# Patient Record
Sex: Female | Born: 1935 | Race: White | Hispanic: No | Smoking: Never smoker
Health system: Southern US, Community
[De-identification: ages and names within clinical notes are randomized; demographics above are authoritative.]

## PROBLEM LIST (undated history)

## (undated) DIAGNOSIS — T8859XA Other complications of anesthesia, initial encounter: Secondary | ICD-10-CM

## (undated) DIAGNOSIS — N898 Other specified noninflammatory disorders of vagina: Secondary | ICD-10-CM

## (undated) DIAGNOSIS — T4145XA Adverse effect of unspecified anesthetic, initial encounter: Secondary | ICD-10-CM

## (undated) DIAGNOSIS — M1990 Unspecified osteoarthritis, unspecified site: Secondary | ICD-10-CM

## (undated) DIAGNOSIS — I350 Nonrheumatic aortic (valve) stenosis: Secondary | ICD-10-CM

## (undated) DIAGNOSIS — G459 Transient cerebral ischemic attack, unspecified: Secondary | ICD-10-CM

## (undated) DIAGNOSIS — K5900 Constipation, unspecified: Secondary | ICD-10-CM

## (undated) DIAGNOSIS — I10 Essential (primary) hypertension: Secondary | ICD-10-CM

## (undated) DIAGNOSIS — E039 Hypothyroidism, unspecified: Secondary | ICD-10-CM

## (undated) DIAGNOSIS — Z9289 Personal history of other medical treatment: Secondary | ICD-10-CM

## (undated) DIAGNOSIS — R600 Localized edema: Secondary | ICD-10-CM

## (undated) DIAGNOSIS — H539 Unspecified visual disturbance: Secondary | ICD-10-CM

## (undated) DIAGNOSIS — Z8669 Personal history of other diseases of the nervous system and sense organs: Secondary | ICD-10-CM

## (undated) DIAGNOSIS — E119 Type 2 diabetes mellitus without complications: Secondary | ICD-10-CM

## (undated) DIAGNOSIS — E785 Hyperlipidemia, unspecified: Secondary | ICD-10-CM

## (undated) DIAGNOSIS — I4821 Permanent atrial fibrillation: Secondary | ICD-10-CM

## (undated) DIAGNOSIS — I639 Cerebral infarction, unspecified: Secondary | ICD-10-CM

## (undated) DIAGNOSIS — D649 Anemia, unspecified: Secondary | ICD-10-CM

## (undated) DIAGNOSIS — G35 Multiple sclerosis: Secondary | ICD-10-CM

## (undated) DIAGNOSIS — I739 Peripheral vascular disease, unspecified: Secondary | ICD-10-CM

## (undated) HISTORY — DX: Localized edema: R60.0

## (undated) HISTORY — DX: Multiple sclerosis: G35

## (undated) HISTORY — DX: Hypothyroidism, unspecified: E03.9

## (undated) HISTORY — PX: CATARACT EXTRACTION, BILATERAL: SHX1313

## (undated) HISTORY — DX: Peripheral vascular disease, unspecified: I73.9

## (undated) HISTORY — DX: Personal history of other medical treatment: Z92.89

## (undated) HISTORY — PX: BREAST SURGERY: SHX581

## (undated) HISTORY — DX: Essential (primary) hypertension: I10

## (undated) HISTORY — DX: Hyperlipidemia, unspecified: E78.5

## (undated) HISTORY — DX: Nonrheumatic aortic (valve) stenosis: I35.0

## (undated) HISTORY — PX: JOINT REPLACEMENT: SHX530

## (undated) HISTORY — PX: THYROIDECTOMY, PARTIAL: SHX18

## (undated) HISTORY — DX: Personal history of other diseases of the nervous system and sense organs: Z86.69

## (undated) HISTORY — DX: Permanent atrial fibrillation: I48.21

## (undated) HISTORY — PX: CHOLECYSTECTOMY: SHX55

---

## 2009-08-30 ENCOUNTER — Encounter (INDEPENDENT_AMBULATORY_CARE_PROVIDER_SITE_OTHER): Payer: Self-pay | Admitting: Internal Medicine

## 2009-08-30 ENCOUNTER — Inpatient Hospital Stay (HOSPITAL_COMMUNITY): Admission: EM | Admit: 2009-08-30 | Discharge: 2009-09-02 | Payer: Self-pay | Admitting: Emergency Medicine

## 2009-08-30 ENCOUNTER — Ambulatory Visit: Payer: Self-pay | Admitting: Cardiology

## 2009-08-30 ENCOUNTER — Ambulatory Visit: Payer: Self-pay | Admitting: Surgery

## 2009-08-30 DIAGNOSIS — I739 Peripheral vascular disease, unspecified: Secondary | ICD-10-CM

## 2009-08-30 HISTORY — DX: Peripheral vascular disease, unspecified: I73.9

## 2009-09-04 ENCOUNTER — Ambulatory Visit (HOSPITAL_COMMUNITY): Admission: RE | Admit: 2009-09-04 | Discharge: 2009-09-04 | Payer: Self-pay | Admitting: Internal Medicine

## 2009-11-16 ENCOUNTER — Emergency Department (HOSPITAL_COMMUNITY)
Admission: EM | Admit: 2009-11-16 | Discharge: 2009-11-17 | Payer: Self-pay | Source: Home / Self Care | Admitting: Emergency Medicine

## 2010-01-17 ENCOUNTER — Encounter: Admission: RE | Admit: 2010-01-17 | Discharge: 2010-01-17 | Payer: Self-pay | Admitting: Neurology

## 2010-03-07 ENCOUNTER — Encounter: Admission: RE | Admit: 2010-03-07 | Discharge: 2010-03-07 | Payer: Self-pay | Admitting: *Deleted

## 2010-05-17 DIAGNOSIS — Z9289 Personal history of other medical treatment: Secondary | ICD-10-CM

## 2010-05-17 HISTORY — DX: Personal history of other medical treatment: Z92.89

## 2010-06-10 ENCOUNTER — Inpatient Hospital Stay (HOSPITAL_COMMUNITY)
Admission: RE | Admit: 2010-06-10 | Discharge: 2010-06-16 | Payer: Self-pay | Source: Home / Self Care | Admitting: Internal Medicine

## 2010-08-17 HISTORY — PX: TOTAL KNEE ARTHROPLASTY: SHX125

## 2010-10-29 LAB — CBC
HCT: 32.1 % — ABNORMAL LOW (ref 36.0–46.0)
HCT: 33 % — ABNORMAL LOW (ref 36.0–46.0)
HCT: 34.8 % — ABNORMAL LOW (ref 36.0–46.0)
Hemoglobin: 10.5 g/dL — ABNORMAL LOW (ref 12.0–15.0)
Hemoglobin: 11 g/dL — ABNORMAL LOW (ref 12.0–15.0)
Hemoglobin: 11.3 g/dL — ABNORMAL LOW (ref 12.0–15.0)
Hemoglobin: 14.7 g/dL (ref 12.0–15.0)
MCH: 29.6 pg (ref 26.0–34.0)
MCH: 30.6 pg (ref 26.0–34.0)
MCH: 30.6 pg (ref 26.0–34.0)
MCH: 30.9 pg (ref 26.0–34.0)
MCHC: 32.5 g/dL (ref 30.0–36.0)
MCHC: 32.7 g/dL (ref 30.0–36.0)
MCHC: 33.3 g/dL (ref 30.0–36.0)
MCV: 91.1 fL (ref 78.0–100.0)
MCV: 92.7 fL (ref 78.0–100.0)
MCV: 93.6 fL (ref 78.0–100.0)
Platelets: 165 K/uL (ref 150–400)
Platelets: 170 K/uL (ref 150–400)
Platelets: 265 10*3/uL (ref 150–400)
RBC: 3.43 MIL/uL — ABNORMAL LOW (ref 3.87–5.11)
RBC: 3.56 MIL/uL — ABNORMAL LOW (ref 3.87–5.11)
RBC: 4.8 MIL/uL (ref 3.87–5.11)
RDW: 13.8 % (ref 11.5–15.5)
RDW: 13.8 % (ref 11.5–15.5)
WBC: 12.1 K/uL — ABNORMAL HIGH (ref 4.0–10.5)
WBC: 7.9 10*3/uL (ref 4.0–10.5)
WBC: 9.7 K/uL (ref 4.0–10.5)

## 2010-10-29 LAB — BASIC METABOLIC PANEL WITH GFR
BUN: 12 mg/dL (ref 6–23)
CO2: 29 meq/L (ref 19–32)
Calcium: 8 mg/dL — ABNORMAL LOW (ref 8.4–10.5)
Chloride: 102 meq/L (ref 96–112)
Creatinine, Ser: 0.85 mg/dL (ref 0.4–1.2)
GFR calc non Af Amer: >60 mL/min
Glucose, Bld: 133 mg/dL — ABNORMAL HIGH (ref 70–99)
Potassium: 4 meq/L (ref 3.5–5.1)
Sodium: 139 meq/L (ref 135–145)

## 2010-10-29 LAB — URINALYSIS, ROUTINE W REFLEX MICROSCOPIC
Bilirubin Urine: NEGATIVE
Bilirubin Urine: NEGATIVE
Glucose, UA: NEGATIVE mg/dL
Hgb urine dipstick: NEGATIVE
Hgb urine dipstick: NEGATIVE
Ketones, ur: NEGATIVE mg/dL
Nitrite: NEGATIVE
Nitrite: NEGATIVE
Protein, ur: NEGATIVE mg/dL
Protein, ur: NEGATIVE mg/dL
Specific Gravity, Urine: 1.012 (ref 1.005–1.030)
Urobilinogen, UA: 0.2 mg/dL (ref 0.0–1.0)
Urobilinogen, UA: 0.2 mg/dL (ref 0.0–1.0)
pH: 6 (ref 5.0–8.0)

## 2010-10-29 LAB — BASIC METABOLIC PANEL
BUN: 8 mg/dL (ref 6–23)
CO2: 30 mEq/L (ref 19–32)
CO2: 33 mEq/L — ABNORMAL HIGH (ref 19–32)
Calcium: 8.4 mg/dL (ref 8.4–10.5)
Calcium: 9.1 mg/dL (ref 8.4–10.5)
Chloride: 96 mEq/L (ref 96–112)
Creatinine, Ser: 0.74 mg/dL (ref 0.4–1.2)
Creatinine, Ser: 0.76 mg/dL (ref 0.4–1.2)
GFR calc Af Amer: >60 mL/min (ref >60)
GFR calc Af Amer: >60 mL/min (ref >60)
GFR calc non Af Amer: >60 mL/min (ref >60)
Glucose, Bld: 119 mg/dL — ABNORMAL HIGH (ref 70–99)
Potassium: 3.3 mEq/L — ABNORMAL LOW (ref 3.5–5.1)
Sodium: 137 mEq/L (ref 135–145)
Sodium: 140 mEq/L (ref 135–145)

## 2010-10-29 LAB — DIFFERENTIAL
Eosinophils Absolute: 0.4 10*3/uL (ref 0.0–0.7)
Lymphs Abs: 2.3 10*3/uL (ref 0.7–4.0)
Monocytes Relative: 8 % (ref 3–12)
Neutro Abs: 4.6 10*3/uL (ref 1.7–7.7)
Neutrophils Relative %: 57 % (ref 43–77)

## 2010-10-29 LAB — PROTIME-INR
INR: 0.94 (ref 0.00–1.49)
INR: 1.49 (ref 0.00–1.49)
Prothrombin Time: 12.8 seconds (ref 11.6–15.2)
Prothrombin Time: 18.2 seconds — ABNORMAL HIGH (ref 11.6–15.2)

## 2010-10-29 LAB — URINE CULTURE
Colony Count: NO GROWTH
Culture  Setup Time: 201110290048
Culture: NO GROWTH
Special Requests: NEGATIVE

## 2010-10-29 LAB — TYPE AND SCREEN
ABO/RH(D): O POS
Antibody Screen: NEGATIVE

## 2010-10-29 LAB — SURGICAL PCR SCREEN: MRSA, PCR: NEGATIVE

## 2010-10-29 LAB — ABO/RH: ABO/RH(D): O POS

## 2010-10-29 LAB — APTT: aPTT: 55 seconds — ABNORMAL HIGH (ref 24–37)

## 2010-11-02 LAB — CBC
HCT: 40.7 % (ref 36.0–46.0)
Hemoglobin: 13.4 g/dL (ref 12.0–15.0)
Hemoglobin: 13.7 g/dL (ref 12.0–15.0)
MCV: 91.7 fL (ref 78.0–100.0)
RBC: 4.31 MIL/uL (ref 3.87–5.11)
RBC: 4.44 MIL/uL (ref 3.87–5.11)
WBC: 5.9 10*3/uL (ref 4.0–10.5)
WBC: 7.8 10*3/uL (ref 4.0–10.5)

## 2010-11-02 LAB — LIPID PANEL
Cholesterol: 217 mg/dL — ABNORMAL HIGH (ref 0–200)
HDL: 44 mg/dL (ref >39)
LDL Cholesterol: 146 mg/dL — ABNORMAL HIGH (ref 0–99)
Total CHOL/HDL Ratio: 4.9 RATIO
Triglycerides: 137 mg/dL (ref <150)

## 2010-11-02 LAB — COMPREHENSIVE METABOLIC PANEL
ALT: 15 U/L (ref 0–35)
AST: 24 U/L (ref 0–37)
CO2: 28 mEq/L (ref 19–32)
Calcium: 9 mg/dL (ref 8.4–10.5)
Chloride: 104 mEq/L (ref 96–112)
GFR calc Af Amer: >60 mL/min (ref >60)
GFR calc non Af Amer: >60 mL/min (ref >60)
Sodium: 147 mEq/L — ABNORMAL HIGH (ref 135–145)
Total Bilirubin: 0.4 mg/dL (ref 0.3–1.2)

## 2010-11-02 LAB — BASIC METABOLIC PANEL
BUN: 10 mg/dL (ref 6–23)
Chloride: 101 mEq/L (ref 96–112)
GFR calc Af Amer: >60 mL/min (ref >60)
Potassium: 3.6 mEq/L (ref 3.5–5.1)
Sodium: 137 mEq/L (ref 135–145)

## 2010-11-02 LAB — GLUCOSE, CAPILLARY: Glucose-Capillary: 81 mg/dL (ref 70–99)

## 2010-11-02 LAB — DIFFERENTIAL
Eosinophils Absolute: 0.3 10*3/uL (ref 0.0–0.7)
Eosinophils Relative: 6 % — ABNORMAL HIGH (ref 0–5)
Lymphs Abs: 1.3 10*3/uL (ref 0.7–4.0)

## 2010-11-02 LAB — PROTIME-INR
INR: 1.32 (ref 0.00–1.49)
Prothrombin Time: 16.3 seconds — ABNORMAL HIGH (ref 11.6–15.2)
Prothrombin Time: 17.2 seconds — ABNORMAL HIGH (ref 11.6–15.2)

## 2010-11-02 LAB — URINALYSIS, ROUTINE W REFLEX MICROSCOPIC
Bilirubin Urine: NEGATIVE
Nitrite: NEGATIVE
Specific Gravity, Urine: 1.011 (ref 1.005–1.030)
pH: 7.5 (ref 5.0–8.0)

## 2010-11-02 LAB — URINE CULTURE: Colony Count: >=100000

## 2010-11-05 LAB — POCT I-STAT, CHEM 8
BUN: 23 mg/dL (ref 6–23)
Chloride: 104 mEq/L (ref 96–112)
Potassium: 4.1 mEq/L (ref 3.5–5.1)
Sodium: 141 mEq/L (ref 135–145)

## 2010-11-05 LAB — PROTIME-INR: INR: 2.04 — ABNORMAL HIGH (ref 0.00–1.49)

## 2011-02-15 DIAGNOSIS — Z9289 Personal history of other medical treatment: Secondary | ICD-10-CM

## 2011-02-15 HISTORY — DX: Personal history of other medical treatment: Z92.89

## 2011-10-14 DIAGNOSIS — M545 Low back pain: Secondary | ICD-10-CM | POA: Diagnosis not present

## 2011-10-18 IMAGING — CT CT HEAD W/O CM
1 series · 16 of 30 positions shown, 20 images · non-contrast
Comparison: None.

CLINICAL DATA: Code stroke.  Confusion and slurred speech.

CT HEAD WITHOUT CONTRAST
TECHNIQUE: Contiguous axial images were obtained from the base of
the skull through the vertex without contrast.

[Series 2: head routine 4.8 h37s · axial · 0.44mm/px · z∈[-82,+54]mm · 16 of 30 slices shown, 20 images]
[im 2/30  brain]
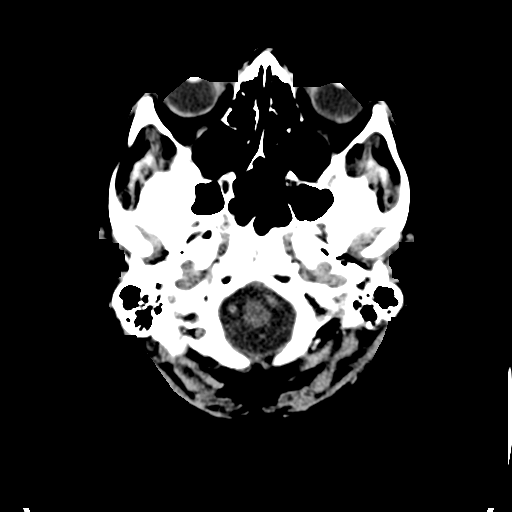
[im 2/30  bone]
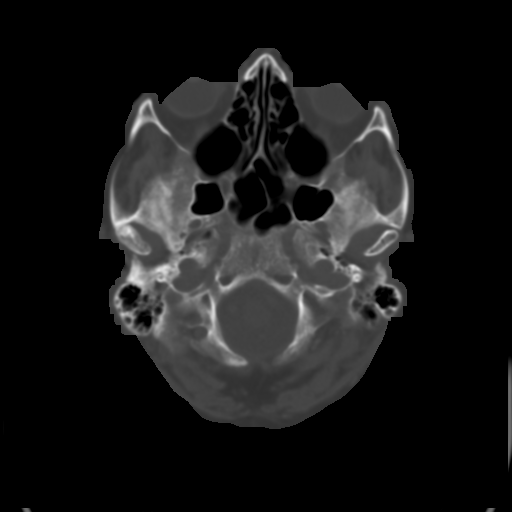
[im 4/30  brain]
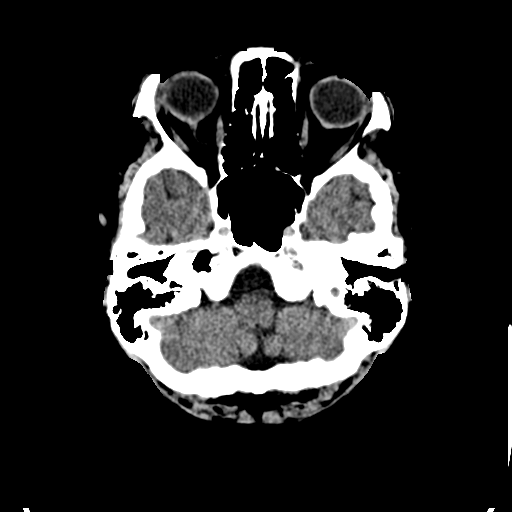
[im 6/30  brain]
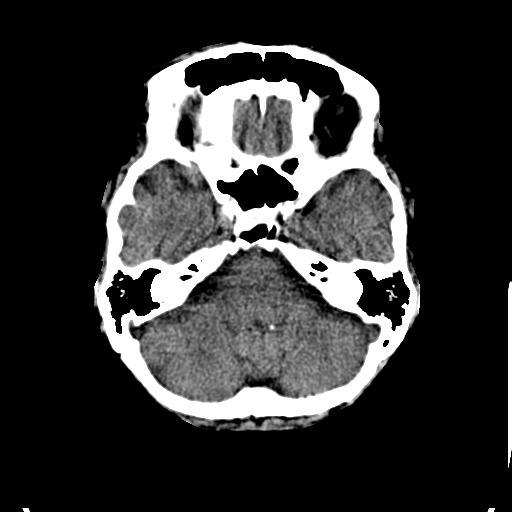
[im 8/30  brain]
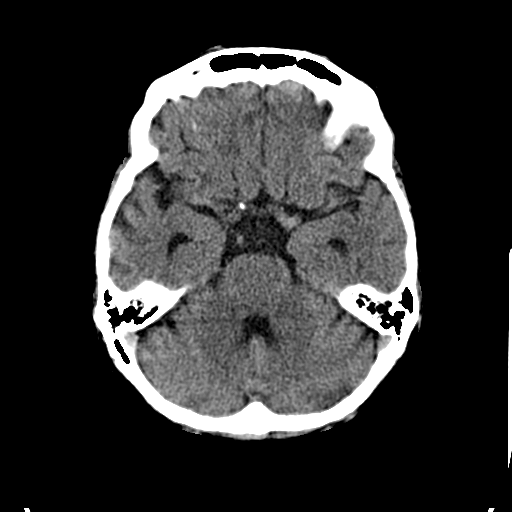
[im 9/30  brain]
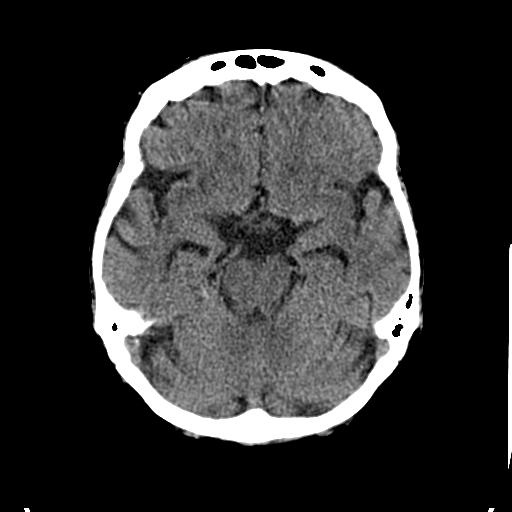
[im 9/30  bone]
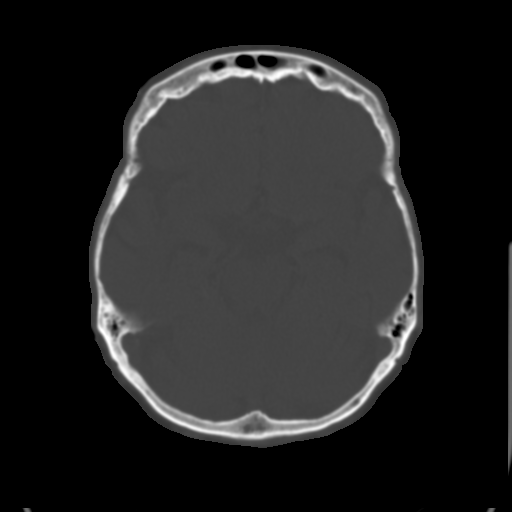
[im 11/30  brain]
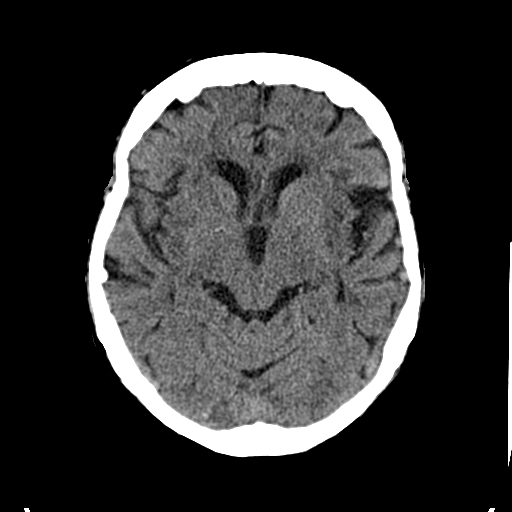
[im 13/30  brain]
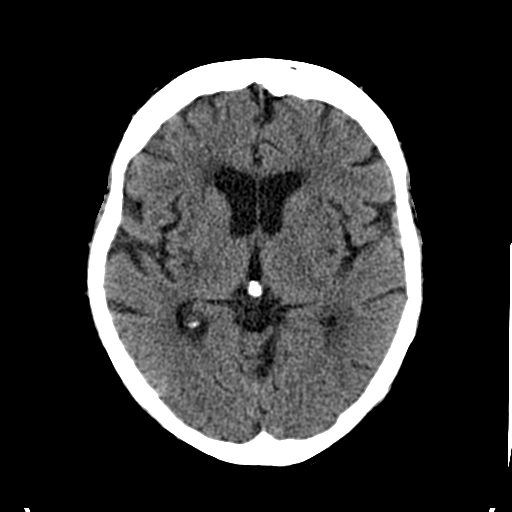
[im 15/30  brain]
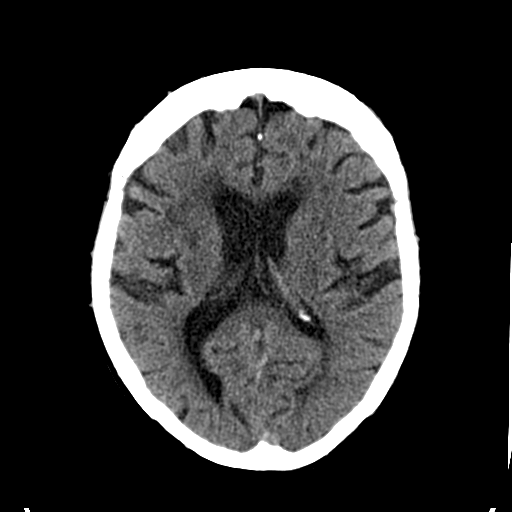
[im 16/30  brain]
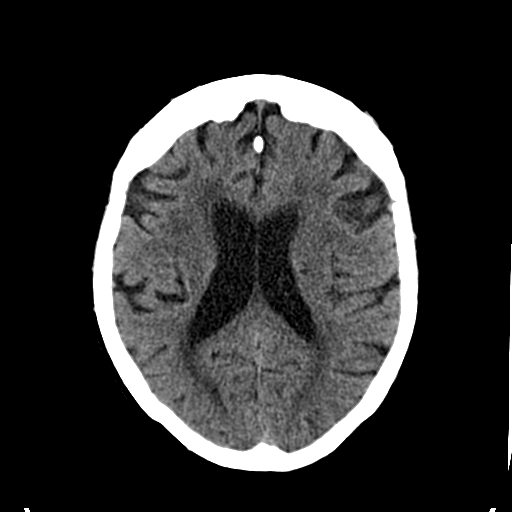
[im 16/30  bone]
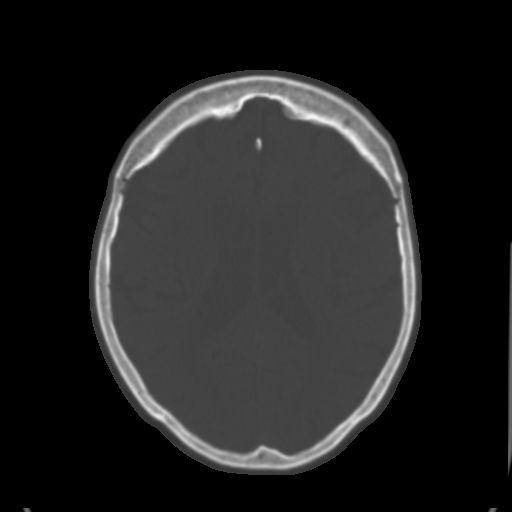
[im 18/30  brain]
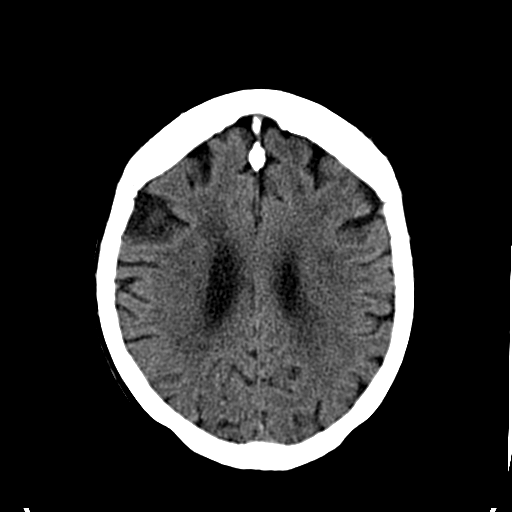
[im 20/30  brain]
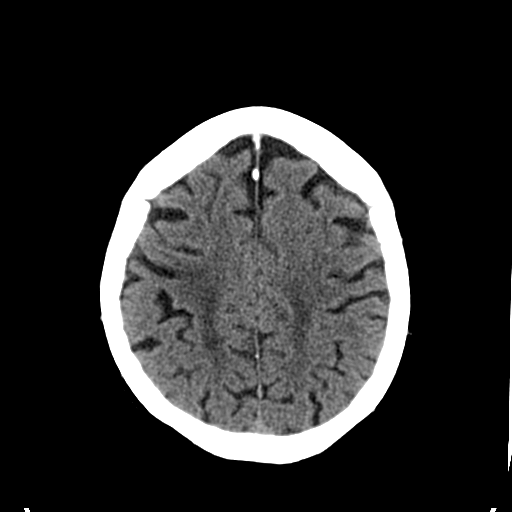
[im 22/30  brain]
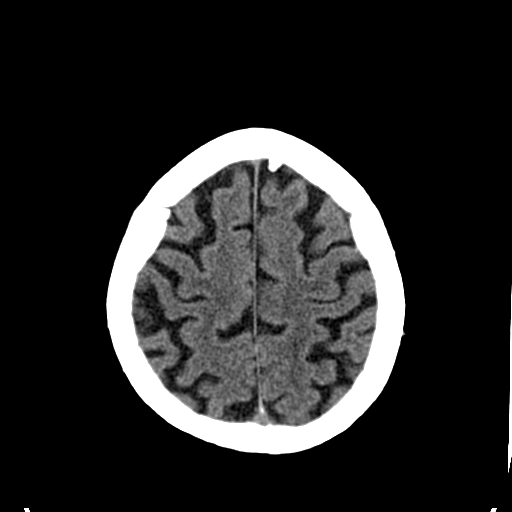
[im 23/30  brain]
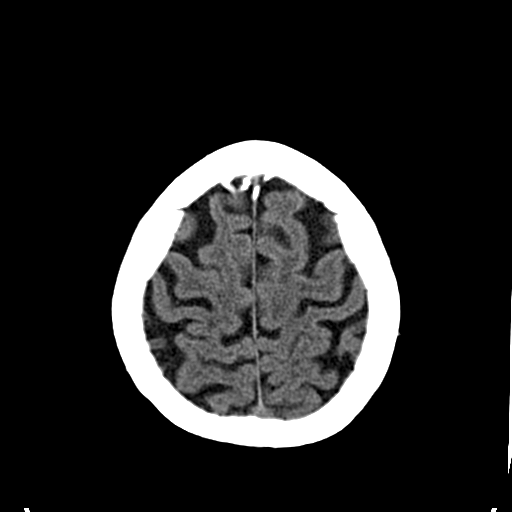
[im 23/30  bone]
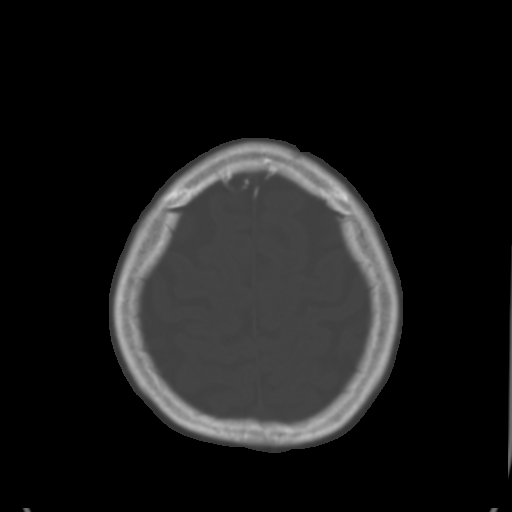
[im 25/30  brain]
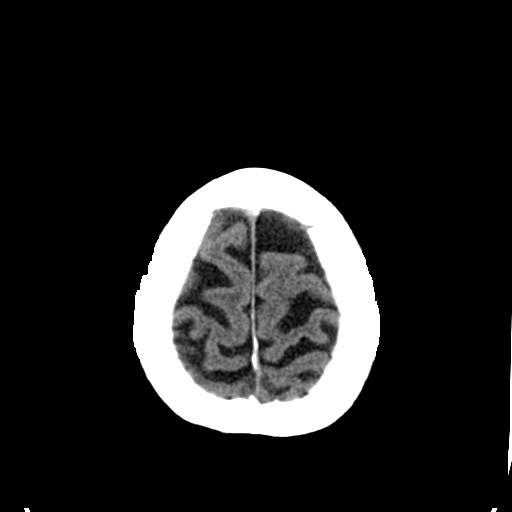
[im 27/30  brain]
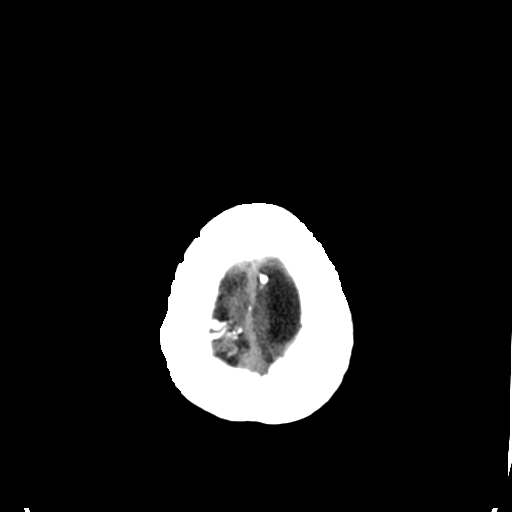
[im 29/30  brain]
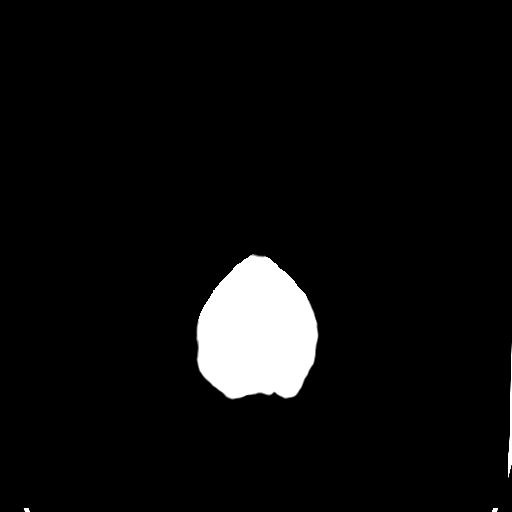

[16 of 30 positions shown; findings below may reference images not displayed]

FINDINGS: There is age appropriate atrophy.  Chronic microvascular
ischemia in the white matter and basal ganglia.  Negative for acute
infarct.  Negative for hemorrhage or mass lesion.  Calvarium is
intact.
IMPRESSION: Chronic microvascular ischemia.  No acute intracranial abnormality.

Critical test results telephoned to Dr. Berenike at the time of
interpretation on 08/30/2009. at 0809 hours.

## 2011-10-18 IMAGING — MR MR HEAD W/O CM
7 of 9 series · 25 of 48 positions shown · non-contrast
Comparison: CT 08/30/2009.

MRI HEAD

CLINICAL DATA: Code stroke.  Slurred speech.  Confusion.

MRI HEAD WITHOUT CONTRAST
MRA HEAD WITHOUT CONTRAST
TECHNIQUE: Multiplanar, multiecho pulse sequences of the brain and
surrounding structures were obtained according to standard protocol
without intravenous contrast.  Angiographic images of the head were
obtained using MRA technique without contrast.

[Series 3: DWI · axial · 5.0mm · 1.09mm/px · z∈[-61,+86]mm · 6 of 56 slices shown (1 of 2)]
[im 1/56]
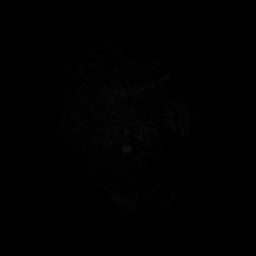
[im 12/56]
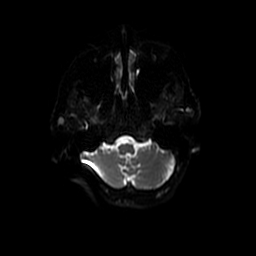
[im 23/56]
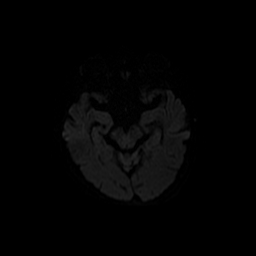
[im 34/56]
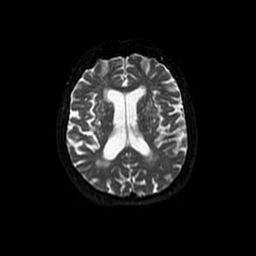
[im 45/56]
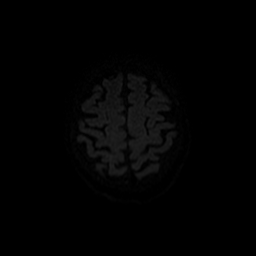
[im 56/56]
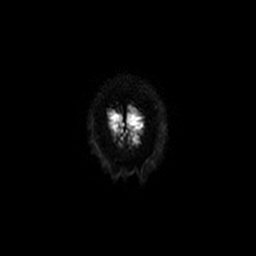

[Series 4: T1 · sagittal · 5.0mm · 0.47mm/px · 2 of 15 slices shown]
[im 1/15]
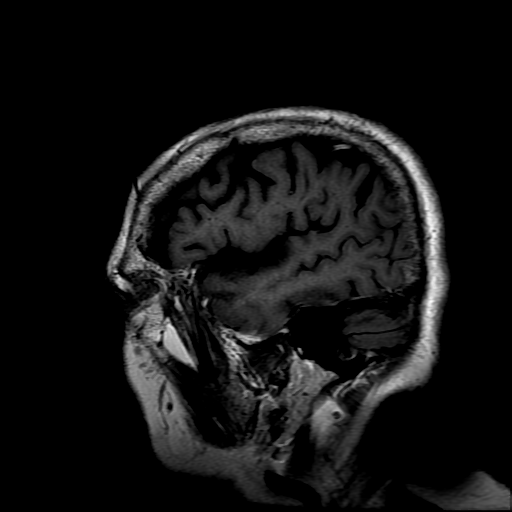
[im 15/15]
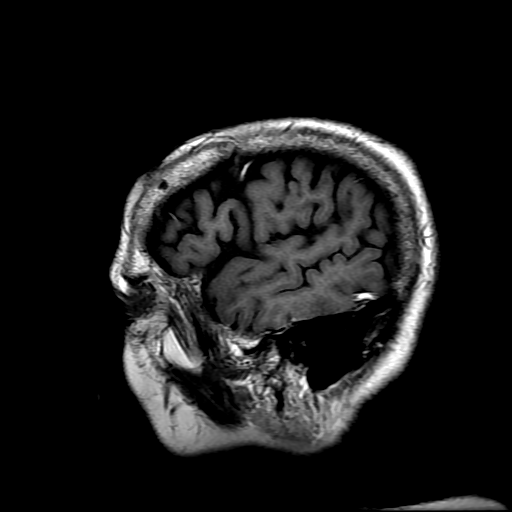

[Series 5: T2 · axial · 5.0mm · 0.43mm/px · z∈[-47,+99]mm · 3 of 22 slices shown (1 of 2)]
[im 1/22]
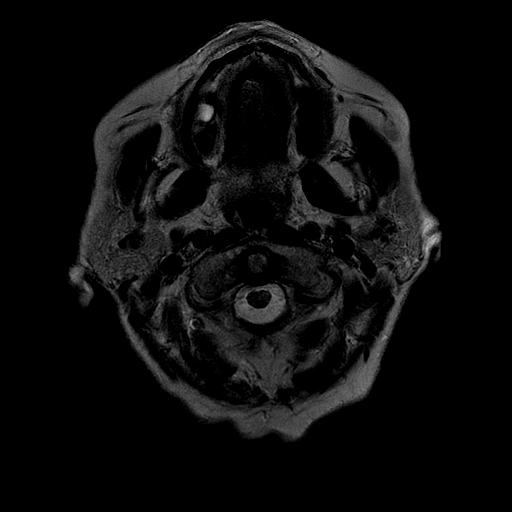
[im 11/22]
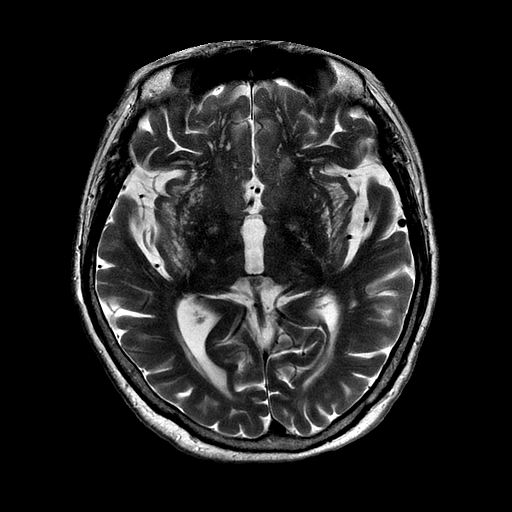
[im 22/22]
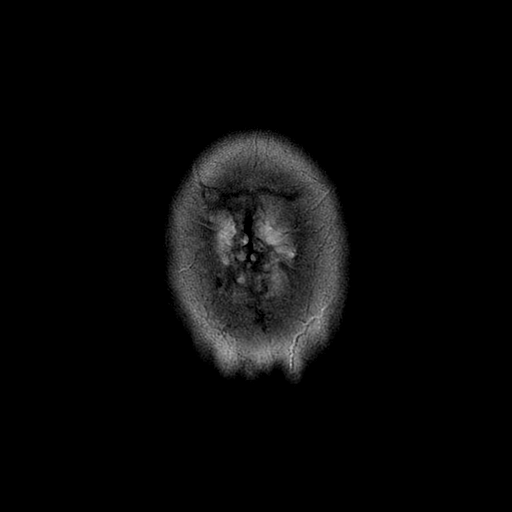

[Series 6: FLAIR · axial · 5.0mm · 0.43mm/px · z∈[-47,+99]mm · 3 of 22 slices shown]
[im 1/22]
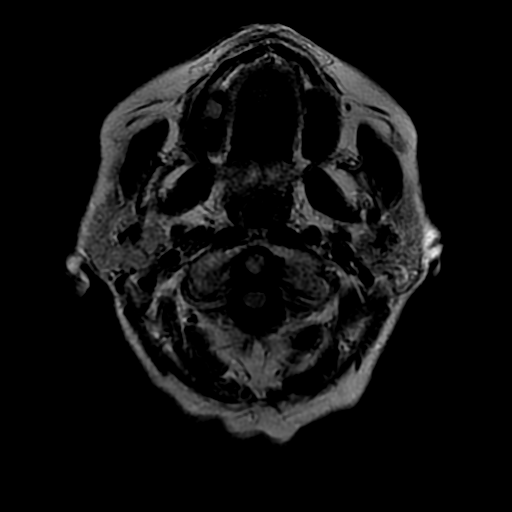
[im 11/22]
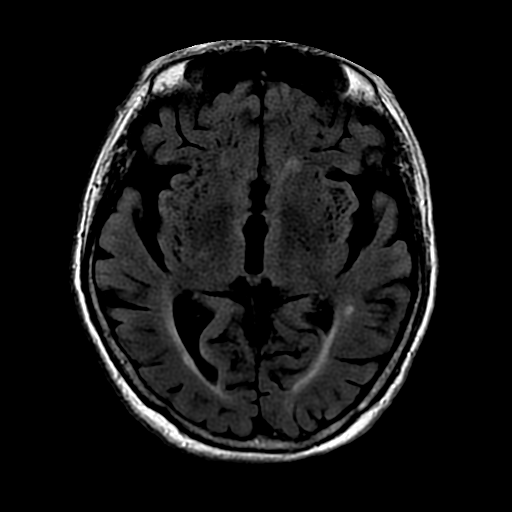
[im 22/22]
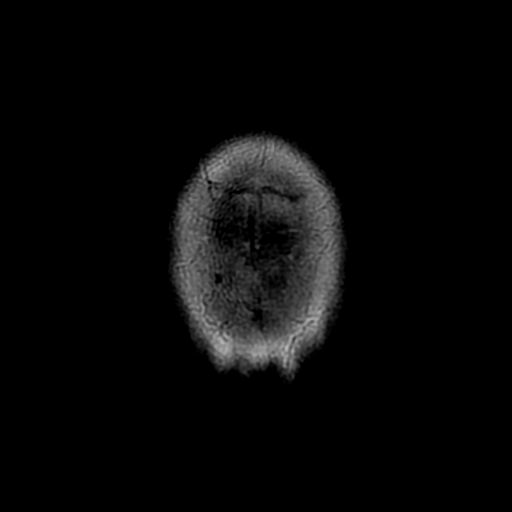

[Series 7: (id) mt fs · axial · 1.4mm · 0.43mm/px · z∈[-37,-1]mm · 4 of 137 slices shown]
[im 9/137]
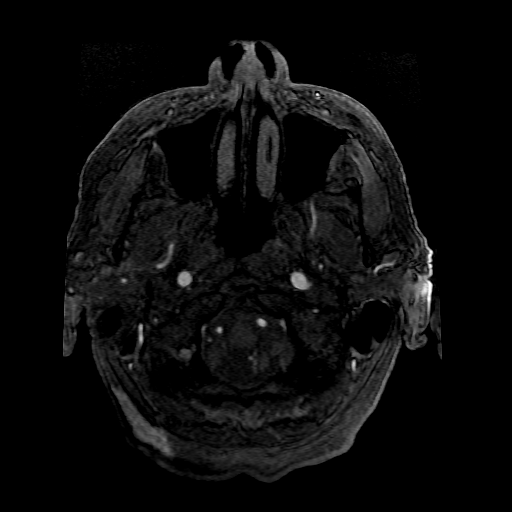
[im 26/137]
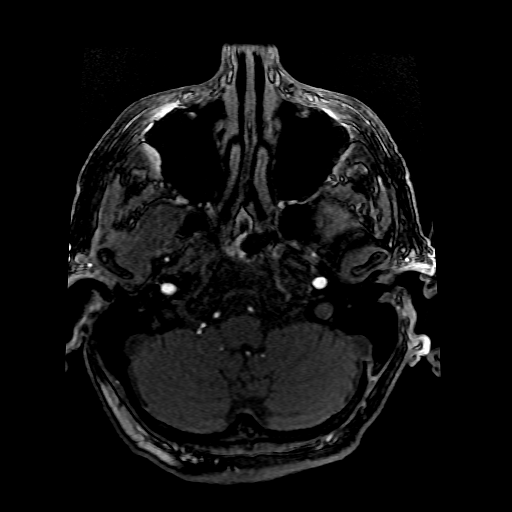
[im 43/137]
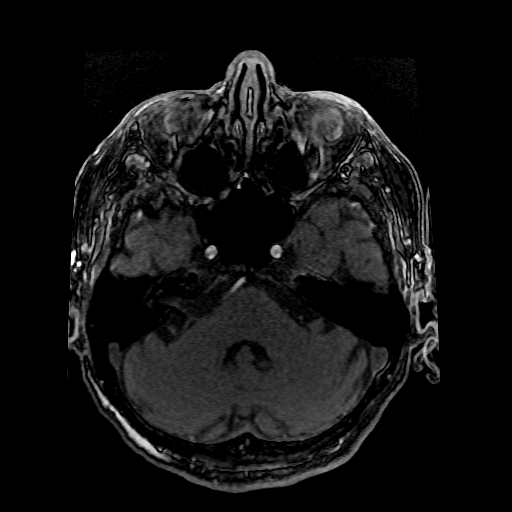
[im 60/137]
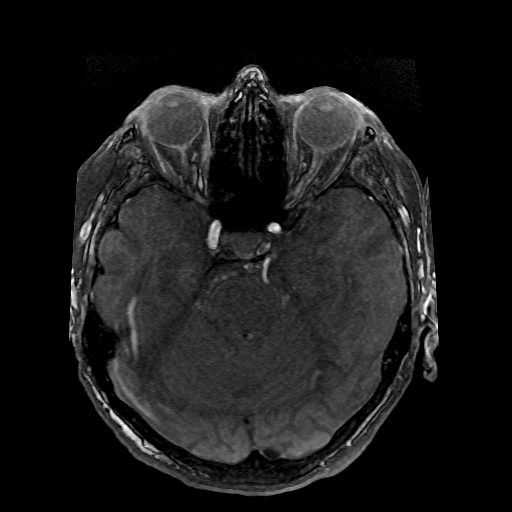

[Series 10: T2 · coronal · 5.0mm · 0.39mm/px · 3 of 24 slices shown (2 of 2)]
[im 1/24]
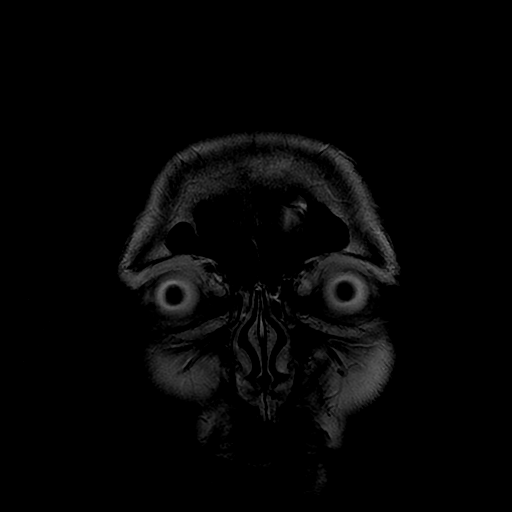
[im 12/24]
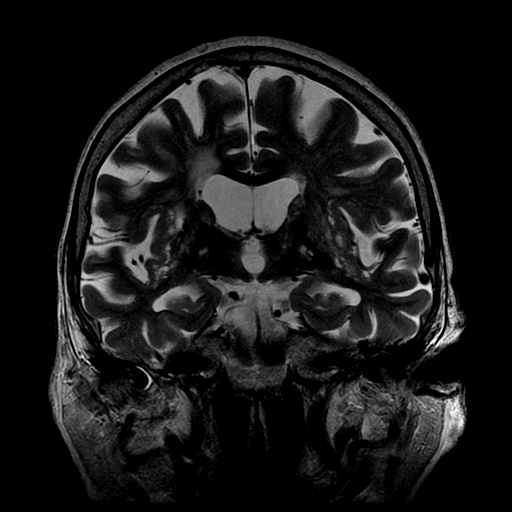
[im 24/24]
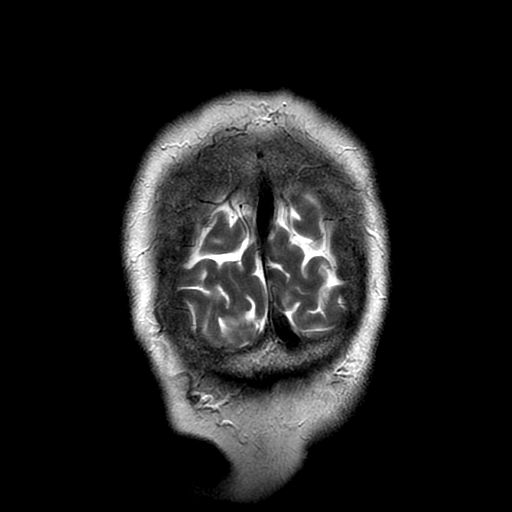

[Series 300: DWI · axial · 5.0mm · 1.09mm/px · z∈[-61,+86]mm · 4 of 28 slices shown (2 of 2)]
[im 1/28]
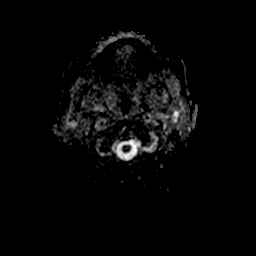
[im 10/28]
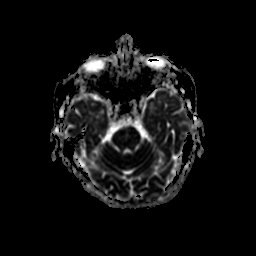
[im 19/28]
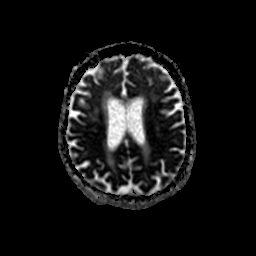
[im 28/28]
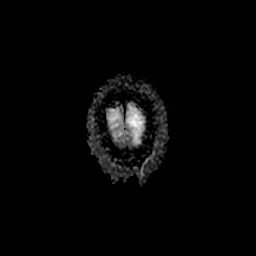

[25 of 48 positions shown; findings below may reference images not displayed]

FINDINGS: Acute infarct in the right posterior frontal cortex and
subcortical white matter.    This area measures approximately 5 x
25 mm.  There is no associated hemorrhage.  On gradient echo
imaging there is suggestion of thrombus in a cortical vessel in the
subarachnoid space near this infarction which could be due to an
embolism or thrombus.  No other acute infarcts are identified.

Small area of micro hemorrhage in the right frontal lobe.  There
also are micro hemorrhages in the right cerebellum.  These may be
due to chronic hypertension.

There is volume loss with generalized atrophy.  Chronic
microvascular ischemia in the white matter and pons.  Negative for
mass lesion.
IMPRESSION: Acute infarct in the right posterior frontal cortex and white
matter.  This could be due to a small embolus or thrombosis.

Atrophy and chronic microvascular ischemia.  There are scattered
small areas of micro hemorrhage which appear chronic and may be due
to hypertension.

MRA HEAD
FINDINGS: There is fetal origin of the posterior cerebral arteries
bilaterally.  The vertebral artery system is relatively small with
hypoplasia of the terminal basilar.  No significant stenosis in the
posterior circulation.

The internal carotid arteries are patent bilaterally.  The anterior
and middle cerebral arteries are patent bilaterally.  Negative for
cerebral aneurysm.
IMPRESSION: No significant intracranial stenosis.

## 2011-10-23 IMAGING — RF DG ESOPHAGUS
8 of 9 series · 19 of 24 positions shown · non-contrast
Comparison: None.

CLINICAL DATA: History given of dysphagia.

ESOPHOGRAM / BARIUM SWALLOW / BARIUM TABLET STUDY
TECHNIQUE: Combined double contrast and single contrast
examination performed using effervescent crystals, thick barium
liquid, and thin barium liquid.  The patient was observed with
fluoroscopy swallowing a 13mm barium sulphate tablet.
Fluoroscopy time:  1.6 minutes.

[Series 1: run · 3 of 17 slices shown (1 of 8)]
[im 1/17]
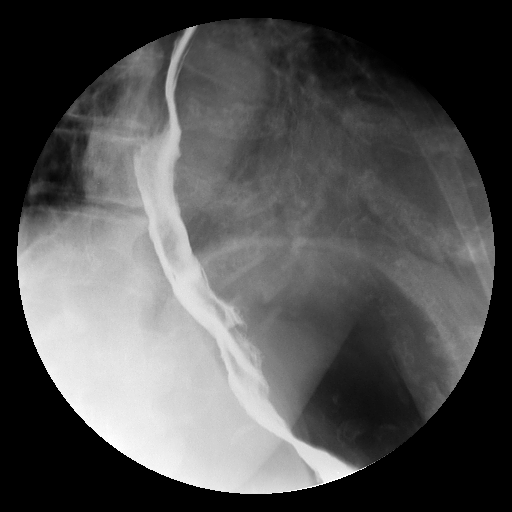
[im 6/17]
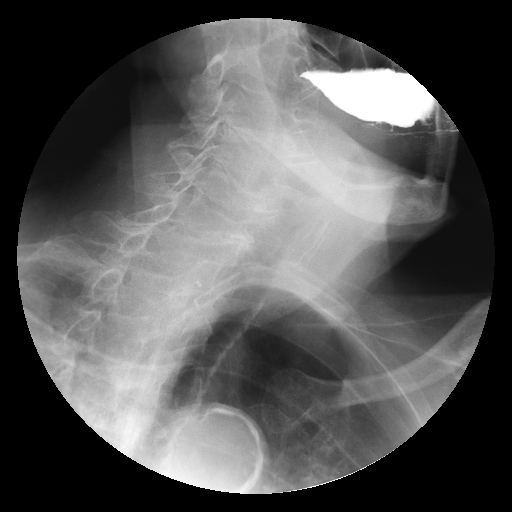
[im 17/17]
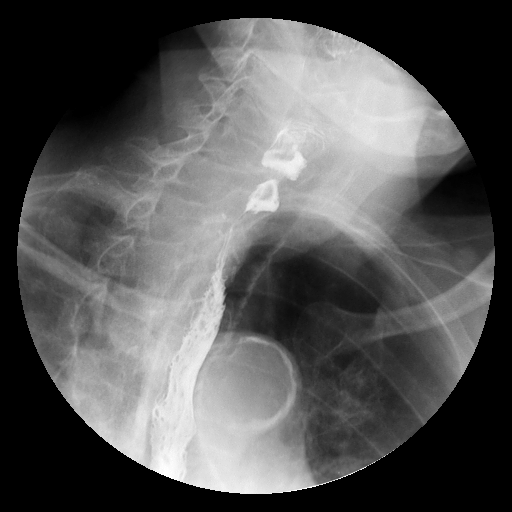

[Series 2: run · 3 of 17 slices shown (2 of 8)]
[im 1/17]
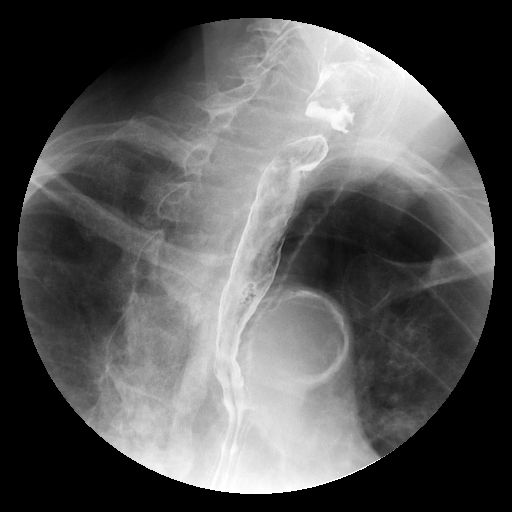
[im 6/17]
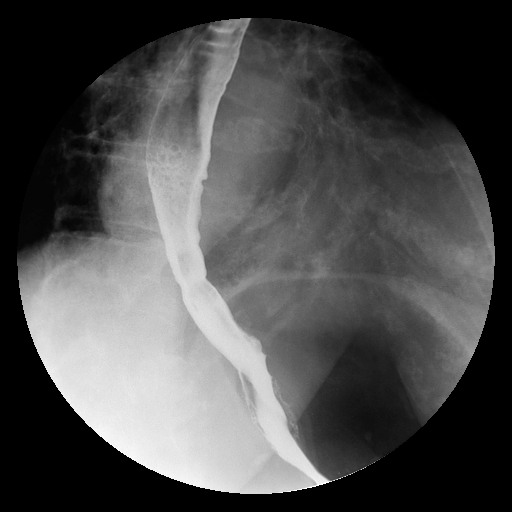
[im 11/17]
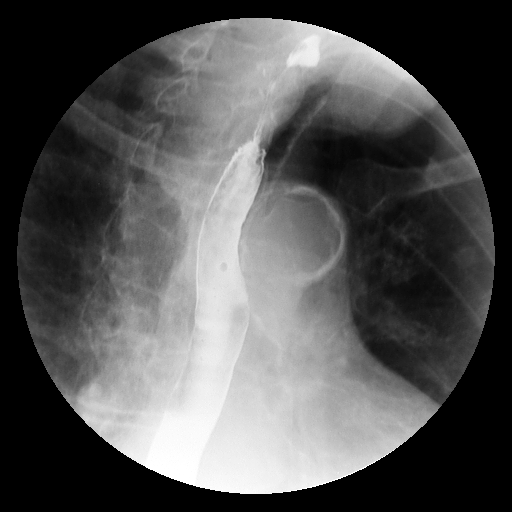

[Series 3: run · 2 of 10 slices shown (3 of 8)]
[im 1/10]
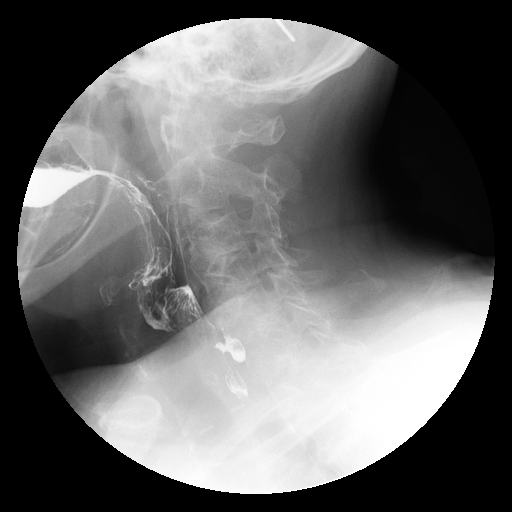
[im 10/10]
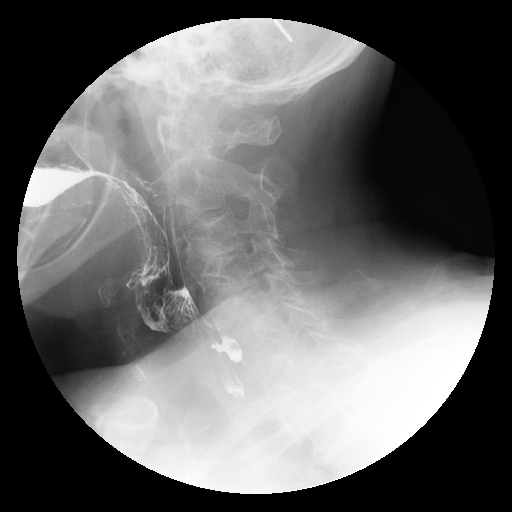

[Series 4: run · 1 of 10 slices shown (4 of 8)]
[im 1/10]
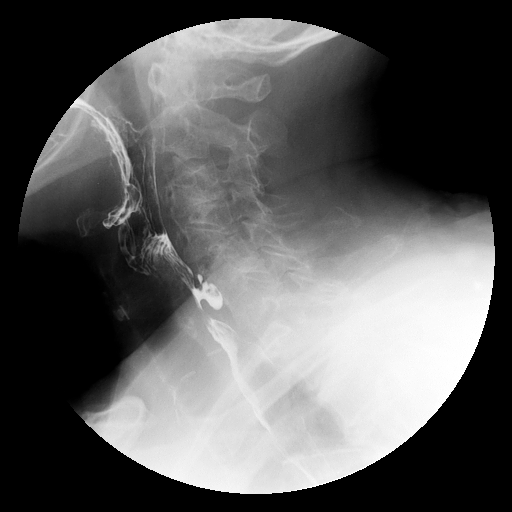

[Series 5: run · 3 of 11 slices shown (5 of 8)]
[im 1/11]
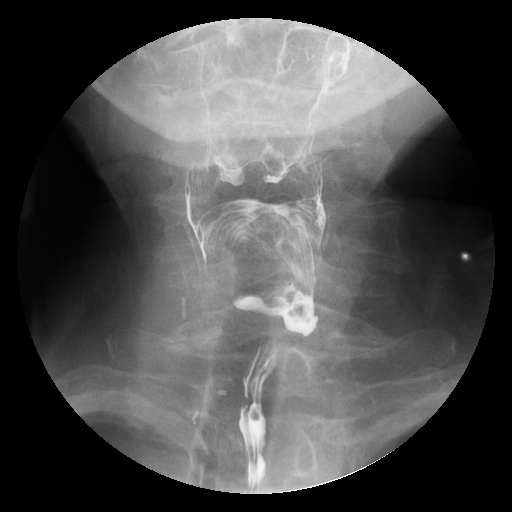
[im 6/11]
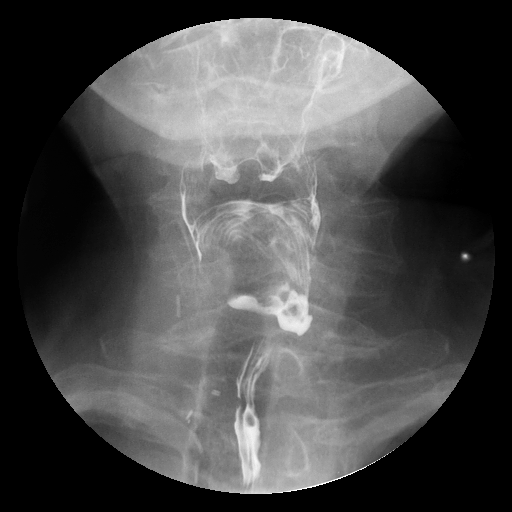
[im 11/11]
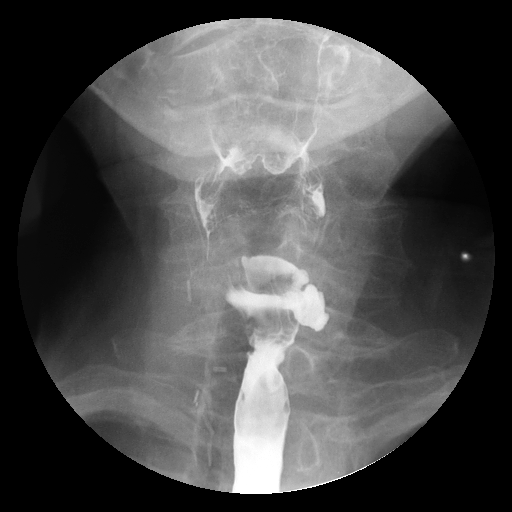

[Series 6: run · 1 of 1 slices shown (6 of 8)]
[im 1/1]
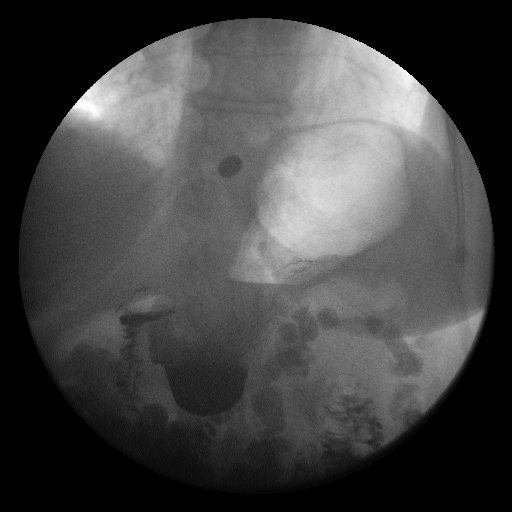

[Series 8: run · 2 of 10 slices shown (7 of 8)]
[im 1/10]
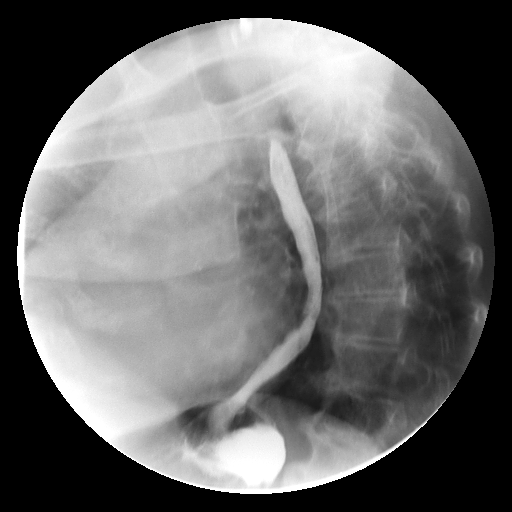
[im 10/10]
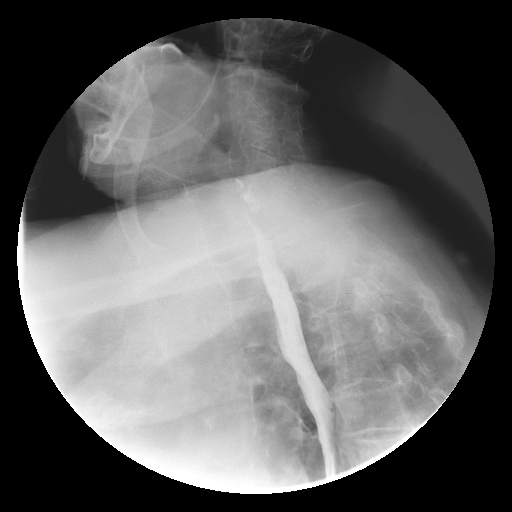

[Series 9: run · 4 of 19 slices shown (8 of 8)]
[im 1/19]
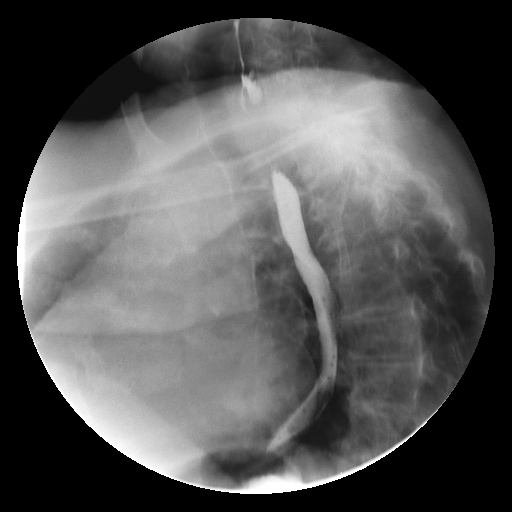
[im 5/19]
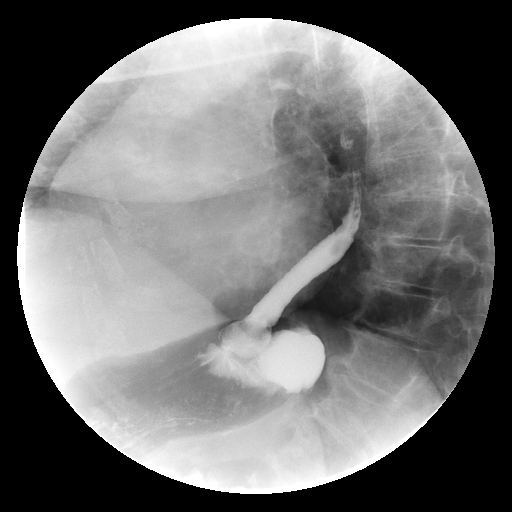
[im 14/19]
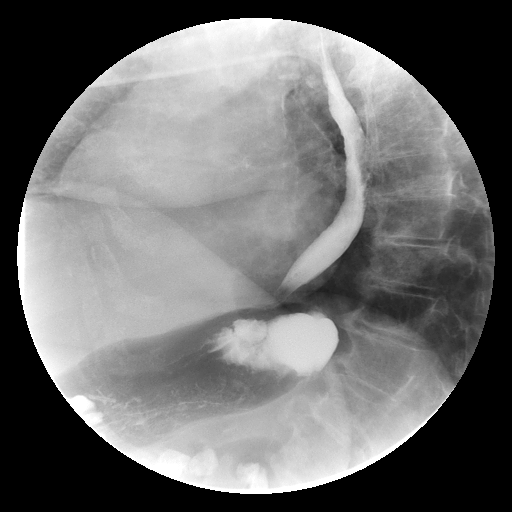
[im 19/19]
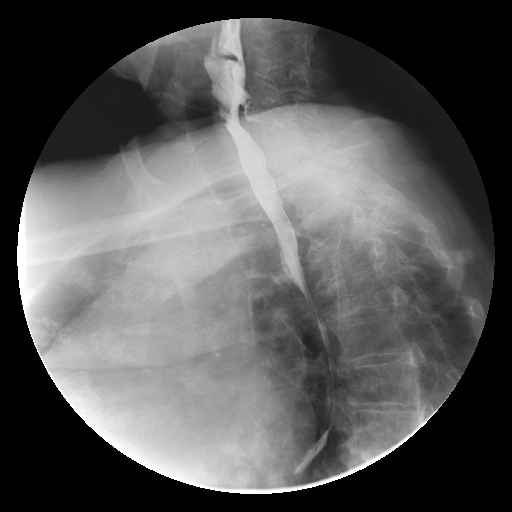

[19 of 24 positions shown; findings below may reference images not displayed]

FINDINGS: Ectasia and nonaneurysmal calcification of the thoracic
aorta is seen. There is degenerative spondylosis.  There is
osteopenic appearance of bones.

 The patient had no difficulty initiating swallowing.  No
aspiration occurred.

There is a prominent cricopharyngeus muscle impression.  There is Alshammari
Akila diverticulum. There are two adjacent small diverticula
which may be part of the Akila diverticulum or separate from it.
No obstruction was evident.

Primary and secondary peristalsis appeared normal.  No esophageal
mass or obstruction was seen.  No hiatal hernia is evident.  No
stricture is seen.  No reflux was evident.  When the patient
swallowed the 13 mm barium sulfate tablet, it stopped at the level
of the cricopharyngeus  muscle impression but then passed to the
level of the distal esophagus and into the stomach.
IMPRESSION: There is a prominent cricopharyngeus muscle impression.  There is Alshammari
Akila diverticulum. There are two adjacent small diverticula
which may be part of the Akila diverticulum or separate from it.
No obstruction was evident.

No other esophageal lesion was evident.

When the patient swallowed the 13 mm barium sulfate tablet, it
stopped at the level of the cricopharyngeus  muscle impression but
then passed to the level of the distal esophagus and into the
stomach.

## 2011-11-09 DIAGNOSIS — R42 Dizziness and giddiness: Secondary | ICD-10-CM | POA: Diagnosis not present

## 2011-11-09 DIAGNOSIS — R262 Difficulty in walking, not elsewhere classified: Secondary | ICD-10-CM | POA: Diagnosis not present

## 2011-11-09 DIAGNOSIS — H8190 Unspecified disorder of vestibular function, unspecified ear: Secondary | ICD-10-CM | POA: Diagnosis not present

## 2011-12-24 DIAGNOSIS — I10 Essential (primary) hypertension: Secondary | ICD-10-CM | POA: Diagnosis not present

## 2011-12-24 DIAGNOSIS — N390 Urinary tract infection, site not specified: Secondary | ICD-10-CM | POA: Diagnosis not present

## 2011-12-24 DIAGNOSIS — E039 Hypothyroidism, unspecified: Secondary | ICD-10-CM | POA: Diagnosis not present

## 2011-12-24 DIAGNOSIS — Z79899 Other long term (current) drug therapy: Secondary | ICD-10-CM | POA: Diagnosis not present

## 2011-12-29 DIAGNOSIS — G35 Multiple sclerosis: Secondary | ICD-10-CM | POA: Diagnosis not present

## 2011-12-29 DIAGNOSIS — I10 Essential (primary) hypertension: Secondary | ICD-10-CM | POA: Diagnosis not present

## 2011-12-29 DIAGNOSIS — E875 Hyperkalemia: Secondary | ICD-10-CM | POA: Diagnosis not present

## 2011-12-29 DIAGNOSIS — E039 Hypothyroidism, unspecified: Secondary | ICD-10-CM | POA: Diagnosis not present

## 2011-12-29 DIAGNOSIS — R609 Edema, unspecified: Secondary | ICD-10-CM | POA: Diagnosis not present

## 2011-12-29 DIAGNOSIS — I4891 Unspecified atrial fibrillation: Secondary | ICD-10-CM | POA: Diagnosis not present

## 2012-01-05 IMAGING — CT CT HEAD W/O CM
5 of 7 series · 18 of 47 positions shown, 19 images · non-contrast
Comparison: MRI brain 08/30/2009

CT HEAD

CLINICAL DATA: Status post fall.  Hit base of head posteriorly.
Headache.  Neck pain.  The patient is on Coumadin.

CT HEAD WITHOUT CONTRAST
CT CERVICAL SPINE WITHOUT CONTRAST
TECHNIQUE: Multidetector CT imaging of the head and cervical spine
was performed following the standard protocol without intravenous
contrast.  Multiplanar CT image reconstructions of the cervical
spine were also generated.

[Series 3: head trauma 4.8 h37s · axial · 0.43mm/px · z∈[-157,-67]mm · 3 of 36 slices shown, 4 images]
[im 9/36  brain]
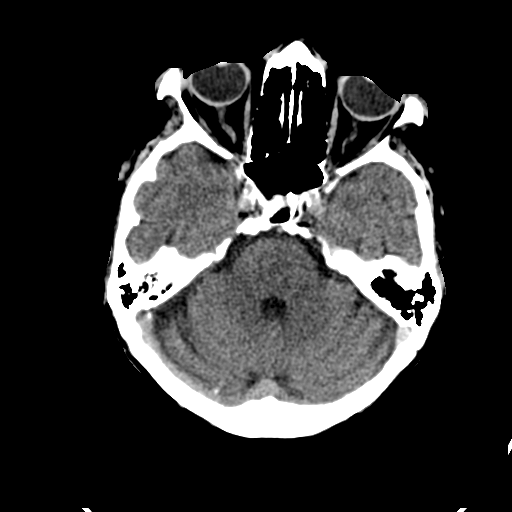
[im 9/36  bone]
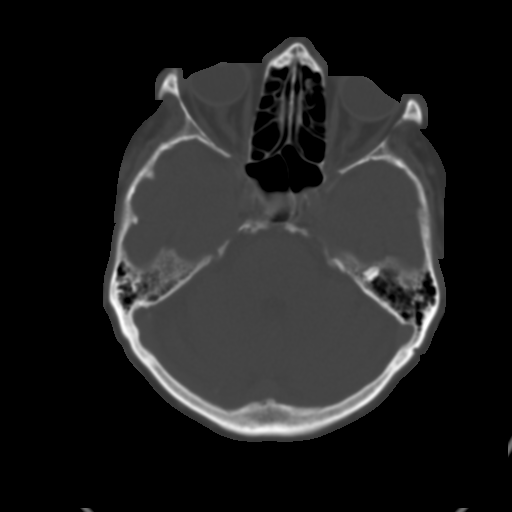
[im 18/36  brain]
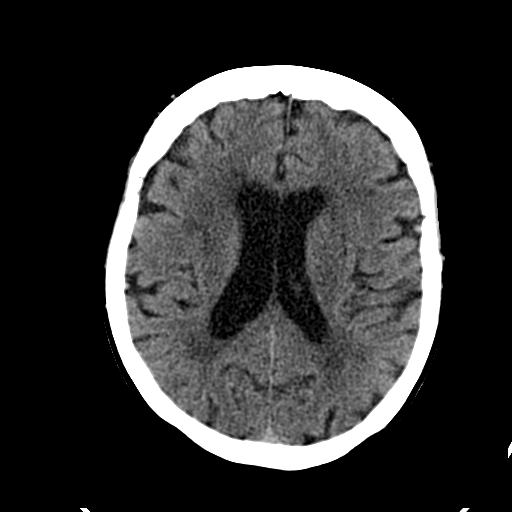
[im 27/36  brain]
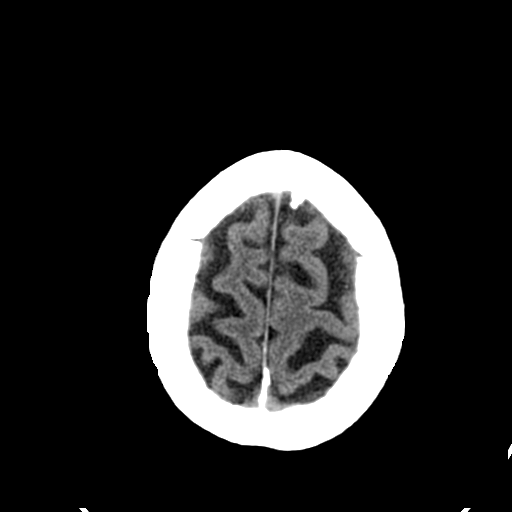

[Series 602: sag · sagittal · 0.33mm/px · 3 of 41 slices shown]
[im 14/41  brain]
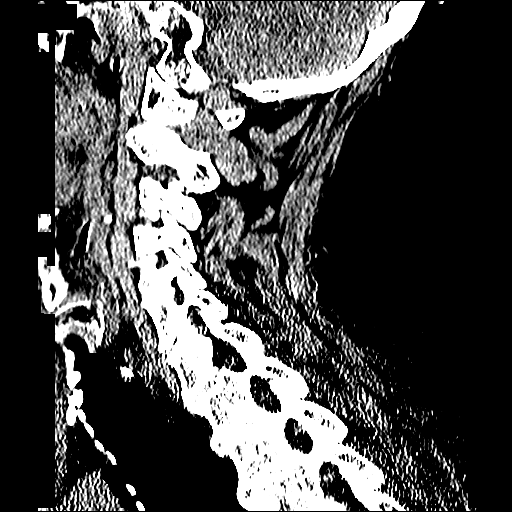
[im 21/41  brain]
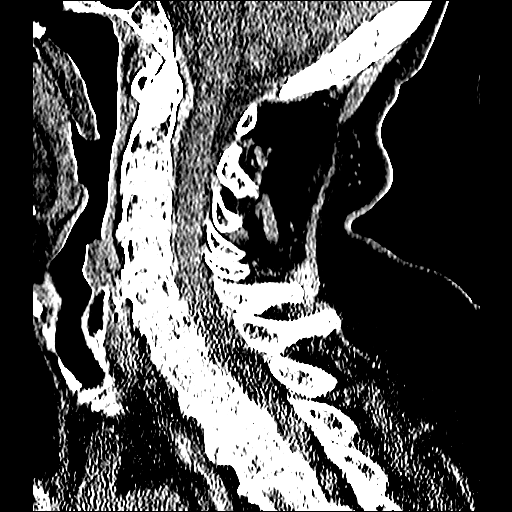
[im 27/41  brain]
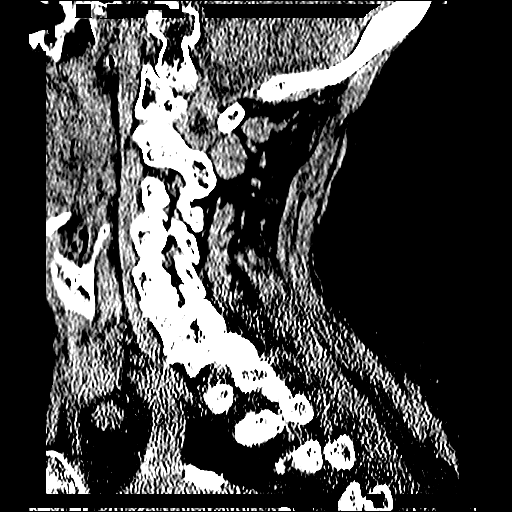

[Series 604: coronal · coronal · 0.33mm/px · 3 of 44 slices shown]
[im 11/44  brain]
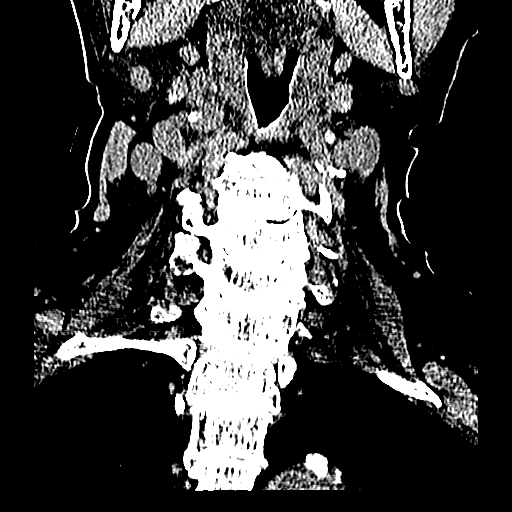
[im 22/44  brain]
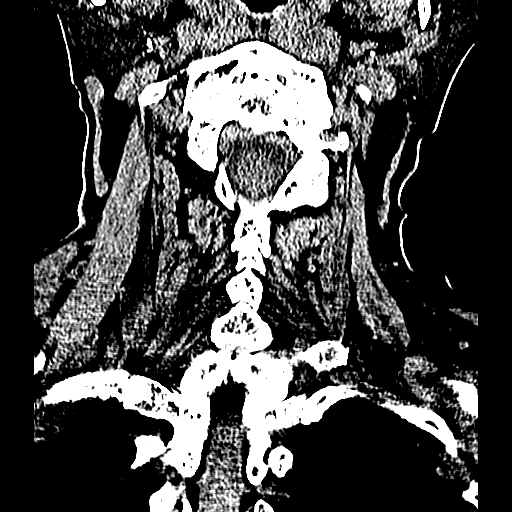
[im 33/44  brain]
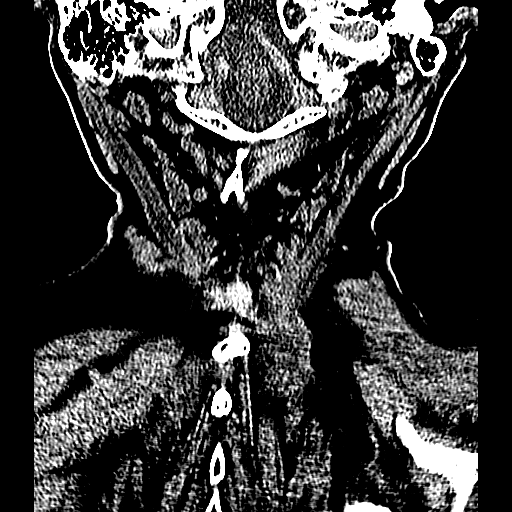

[Series 605: axial · axial · 0.33mm/px · z∈[-277,-224]mm · 4 of 44 slices shown (1 of 2)]
[im 9/44  brain]
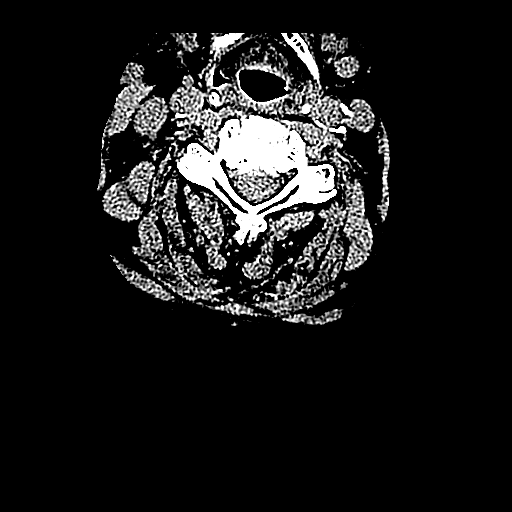
[im 18/44  brain]
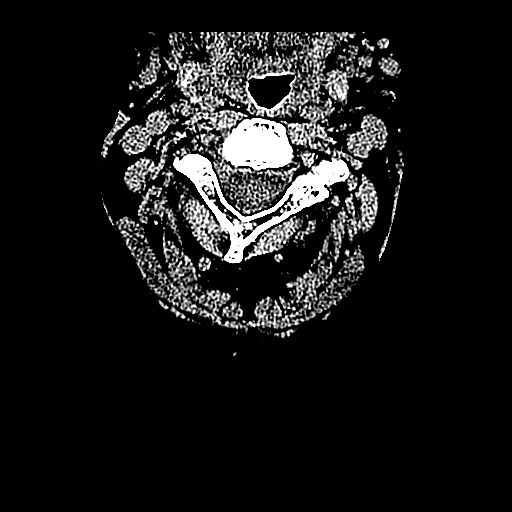
[im 26/44  brain]
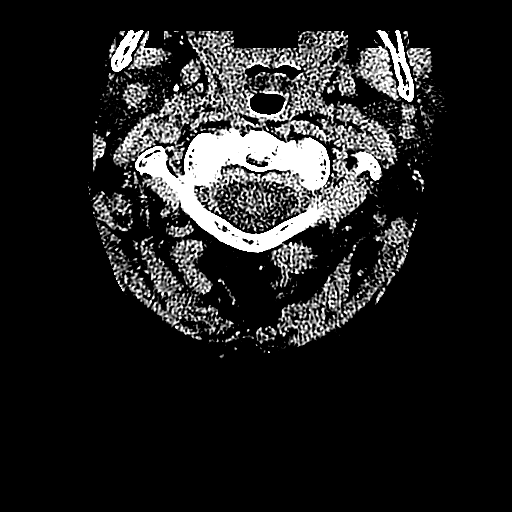
[im 35/44  brain]
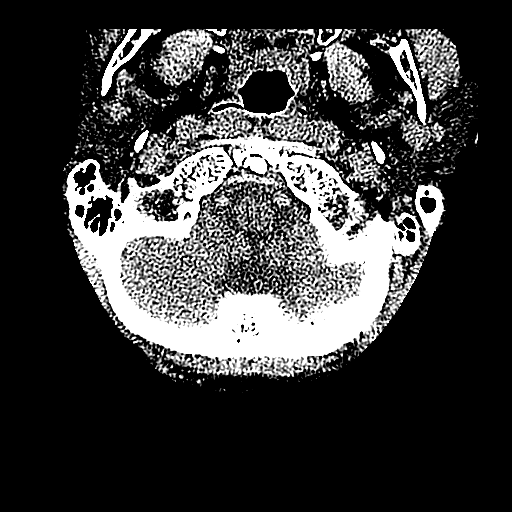

[Series 606: axial · axial · 0.33mm/px · z∈[-370,-315]mm · 5 of 47 slices shown (2 of 2)]
[im 8/47  brain]
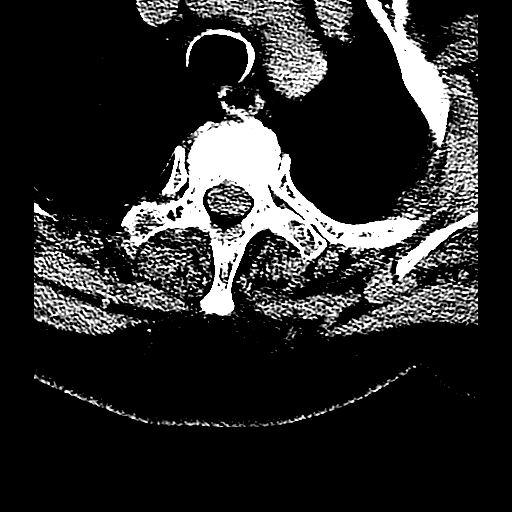
[im 16/47  brain]
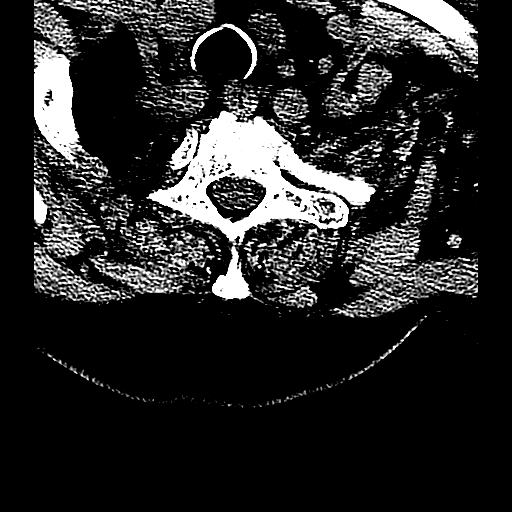
[im 24/47  brain]
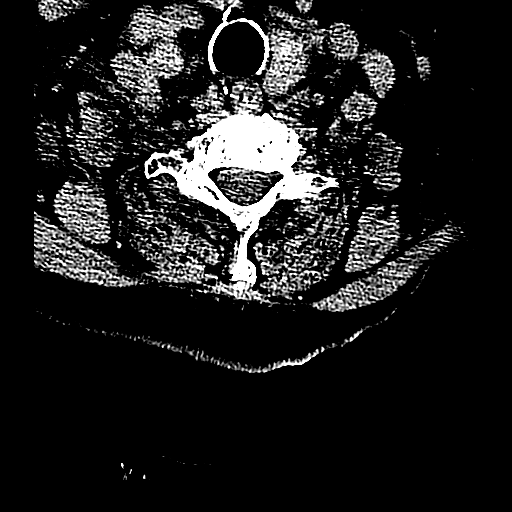
[im 31/47  brain]
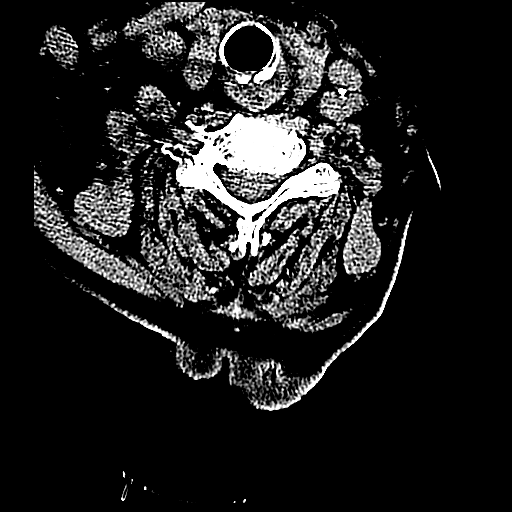
[im 39/47  brain]
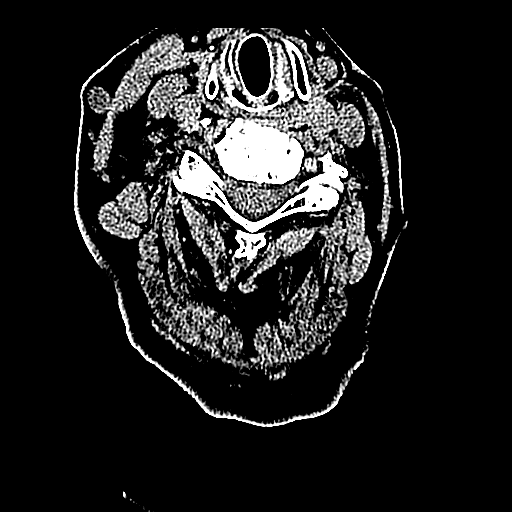

[18 of 47 positions shown; findings below may reference images not displayed]

FINDINGS: Moderate atrophy is stable.  Scattered periventricular
subcortical white matter changes are similar to the prior study.
Remote basal ganglia lacunar infarcts are stable as well.  No acute
cortical infarct, hemorrhage, mass, hydrocephalus, or significant
extra-axial fluid collection is present.

The paranasal sinuses and mastoid air cells are clear.  The osseous
skull is intact.  Atherosclerotic calcifications are noted within
the cavernous carotid arteries bilaterally.
IMPRESSION: 1.  No acute intracranial abnormality or significant interval
change.
2.  Stable atrophy and white matter disease.  This likely reflects
sequelae of chronic microvascular ischemia.

CT CERVICAL SPINE
FINDINGS: The cervical spine is imaged from skull base through T2-
3.  The vertebral body heights are maintained.  Uncovertebral
disease is present throughout the cervical spine from C3-4 through
C6-7.  No acute fracture or traumatic subluxation is evident.  The
lung apices are clear.  Atherosclerotic calcifications are noted at
the aortic arch and carotid bifurcations.  Incidental note is made
that the lower left vertebral artery originates from the aortic
arch.
IMPRESSION: 1.  No acute fracture or traumatic subluxation.
2.  Moderate spondylosis of the cervical spine, predominately due
to uncovertebral disease.

## 2012-01-12 DIAGNOSIS — Z79899 Other long term (current) drug therapy: Secondary | ICD-10-CM | POA: Diagnosis not present

## 2012-01-12 DIAGNOSIS — E875 Hyperkalemia: Secondary | ICD-10-CM | POA: Diagnosis not present

## 2012-01-14 DIAGNOSIS — H903 Sensorineural hearing loss, bilateral: Secondary | ICD-10-CM | POA: Diagnosis not present

## 2012-01-21 DIAGNOSIS — H2510 Age-related nuclear cataract, unspecified eye: Secondary | ICD-10-CM | POA: Diagnosis not present

## 2012-01-29 DIAGNOSIS — R635 Abnormal weight gain: Secondary | ICD-10-CM | POA: Diagnosis not present

## 2012-01-29 DIAGNOSIS — R609 Edema, unspecified: Secondary | ICD-10-CM | POA: Diagnosis not present

## 2012-02-05 DIAGNOSIS — R609 Edema, unspecified: Secondary | ICD-10-CM | POA: Diagnosis not present

## 2012-02-05 DIAGNOSIS — Z79899 Other long term (current) drug therapy: Secondary | ICD-10-CM | POA: Diagnosis not present

## 2012-02-05 DIAGNOSIS — I10 Essential (primary) hypertension: Secondary | ICD-10-CM | POA: Diagnosis not present

## 2012-02-23 DIAGNOSIS — M25559 Pain in unspecified hip: Secondary | ICD-10-CM | POA: Diagnosis not present

## 2012-02-23 DIAGNOSIS — Z79899 Other long term (current) drug therapy: Secondary | ICD-10-CM | POA: Diagnosis not present

## 2012-02-23 DIAGNOSIS — R609 Edema, unspecified: Secondary | ICD-10-CM | POA: Diagnosis not present

## 2012-03-06 IMAGING — MR MR CERVICAL SPINE WO/W CM
9 of 14 series · 27 of 48 positions shown · non-contrast
Comparison: none

[Series 3: T2 · sagittal · 3.0mm · 0.66mm/px · 3 of 12 slices shown (1 of 4)]
[im 1/12]
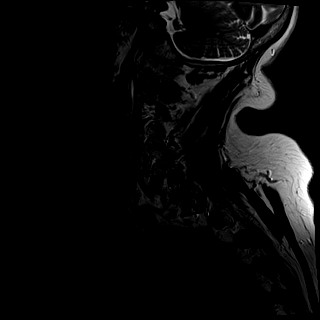
[im 6/12]
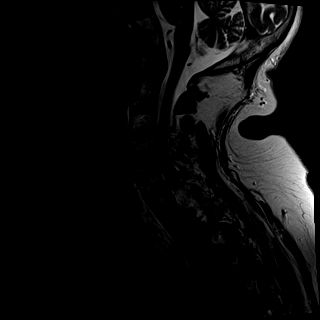
[im 12/12]
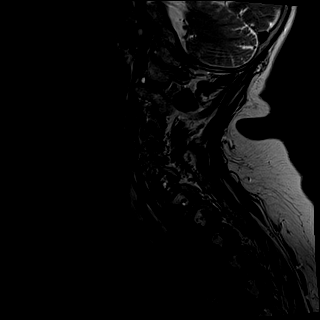

[Series 4: T1 · sagittal · 3.0mm · 0.66mm/px · 3 of 12 slices shown (1 of 2)]
[im 1/12]
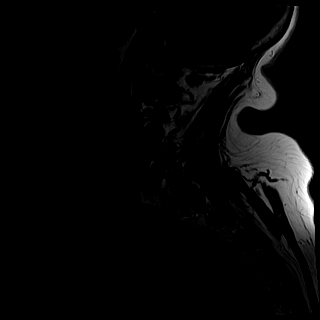
[im 6/12]
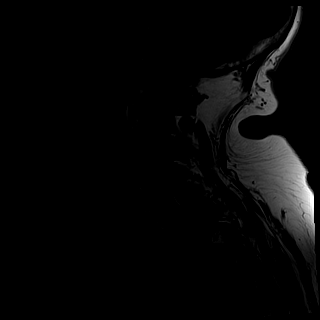
[im 12/12]
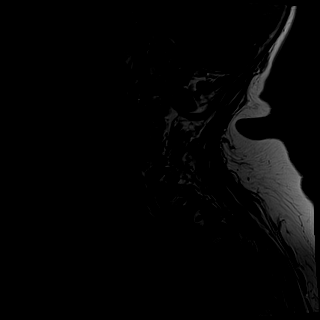

[Series 5: tir sag · sagittal · 3.0mm · 0.43mm/px · 3 of 12 slices shown]
[im 1/12]
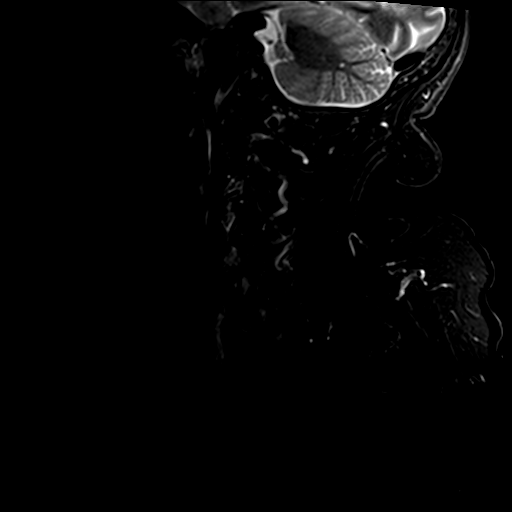
[im 6/12]
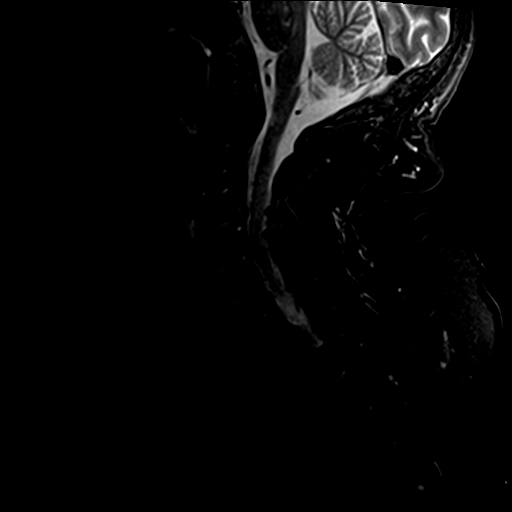
[im 12/12]
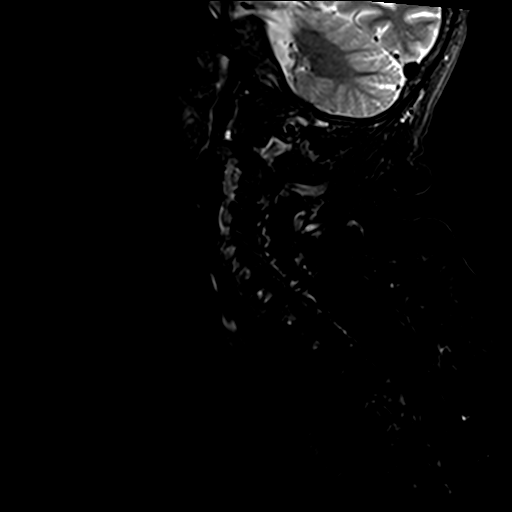

[Series 7: T2 · axial · 4.0mm · 0.62mm/px · z∈[-92,+3]mm · 4 of 22 slices shown (2 of 4)]
[im 1/22]
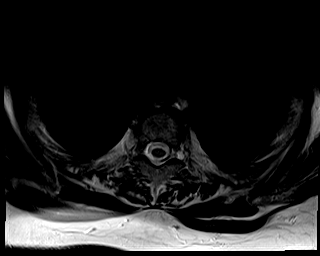
[im 8/22]
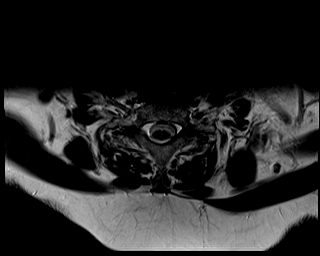
[im 15/22]
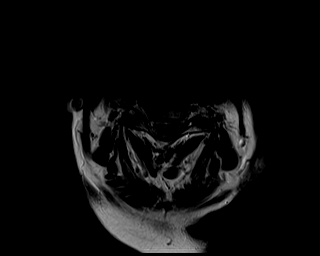
[im 22/22]
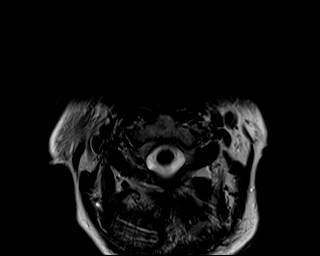

[Series 11: T2 · sagittal · 3.0mm · 0.47mm/px · 2 of 12 slices shown (3 of 4)]
[im 1/12]
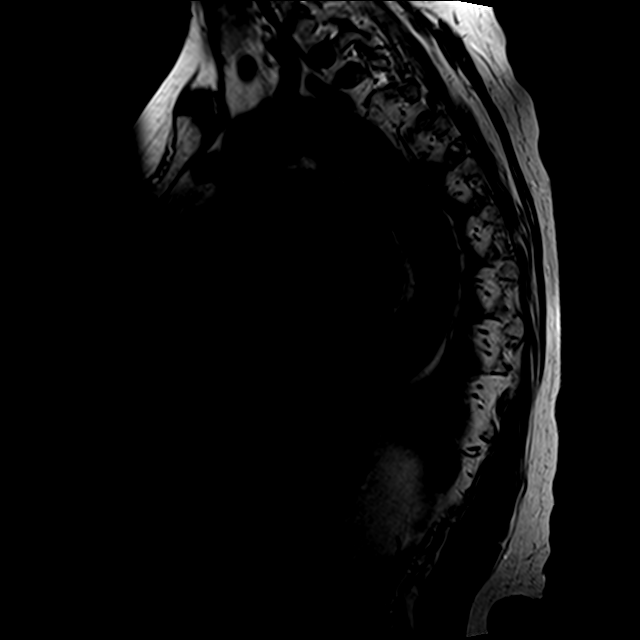
[im 12/12]
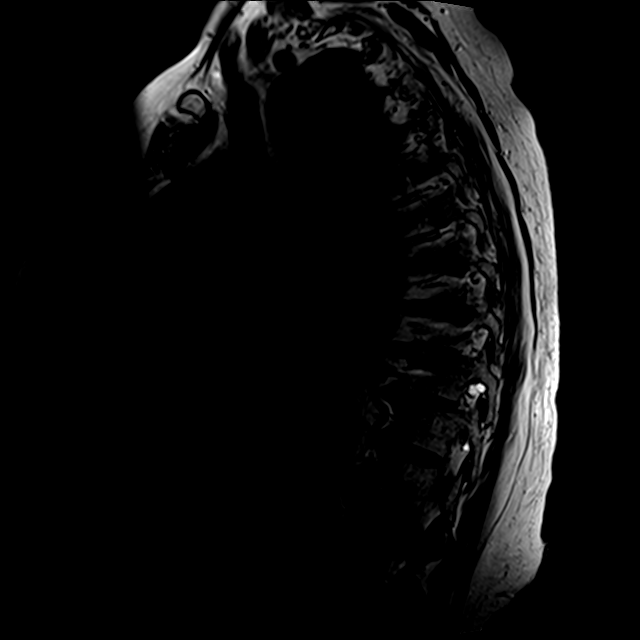

[Series 12: T1 · sagittal · 3.0mm · 0.47mm/px · 2 of 12 slices shown (2 of 2)]
[im 1/12]
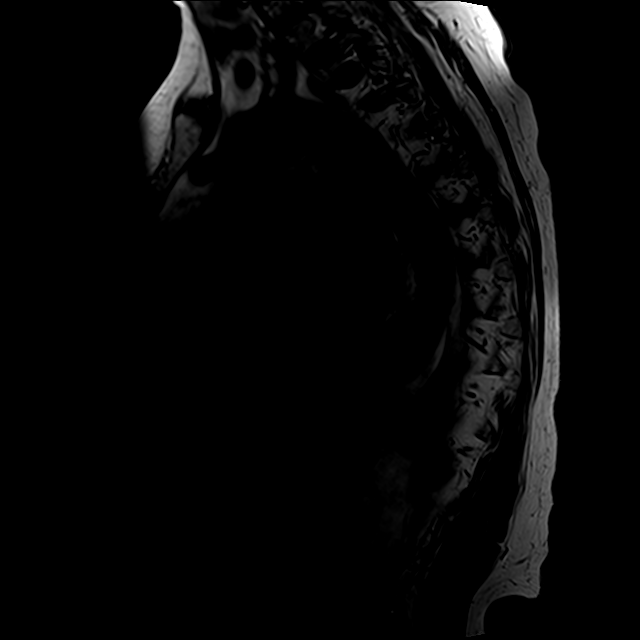
[im 12/12]
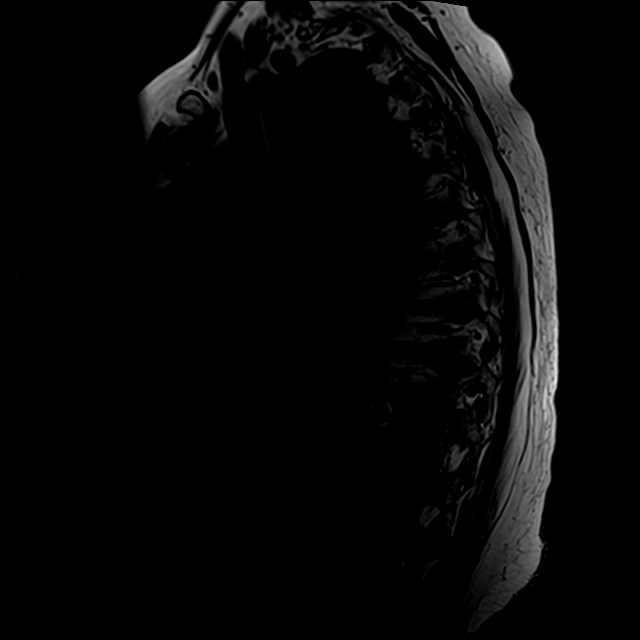

[Series 13: STIR · sagittal · 3.0mm · 0.94mm/px · 2 of 12 slices shown]
[im 1/12]
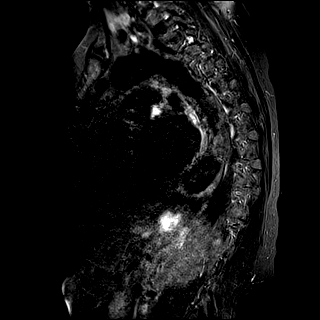
[im 12/12]
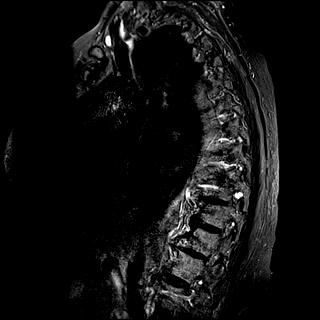

[Series 14: T2 · axial · 3.0mm · 0.47mm/px · z∈[-240,-99]mm · 6 of 34 slices shown (4 of 4)]
[im 1/34]
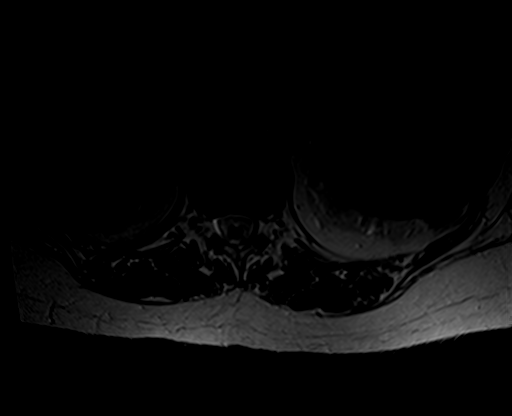
[im 7/34]
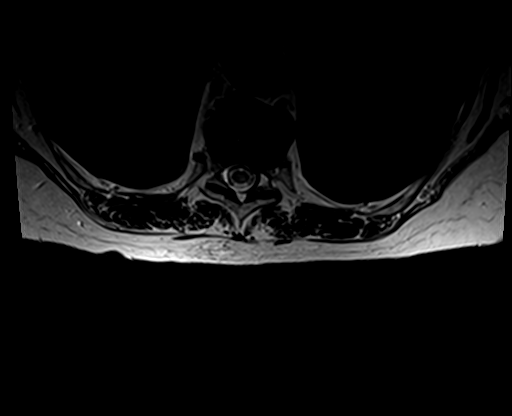
[im 14/34]
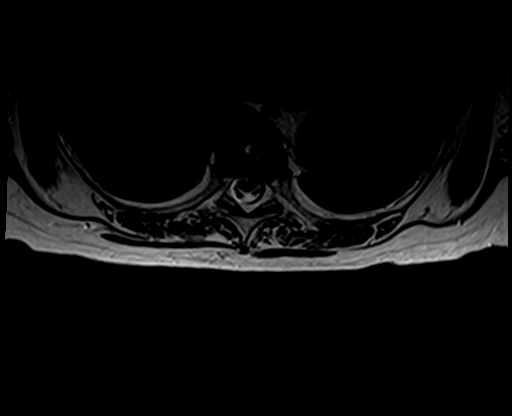
[im 20/34]
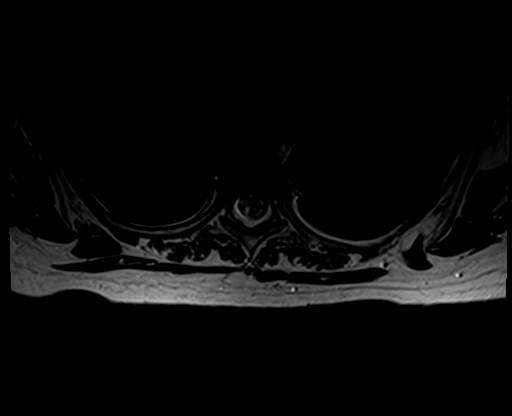
[im 27/34]
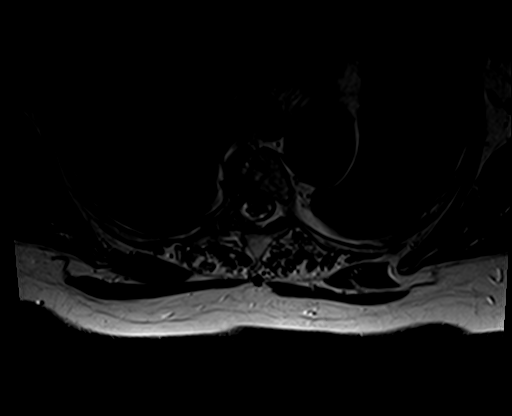
[im 34/34]
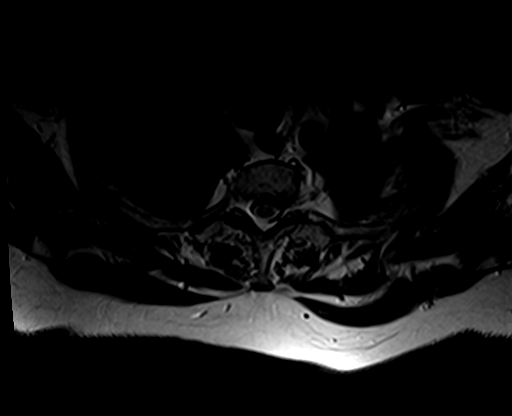

[Series 16: T1 fat-sat post-contrast · sagittal · 4.0mm · 0.68mm/px · 2 of 13 slices shown]
[im 1/13]
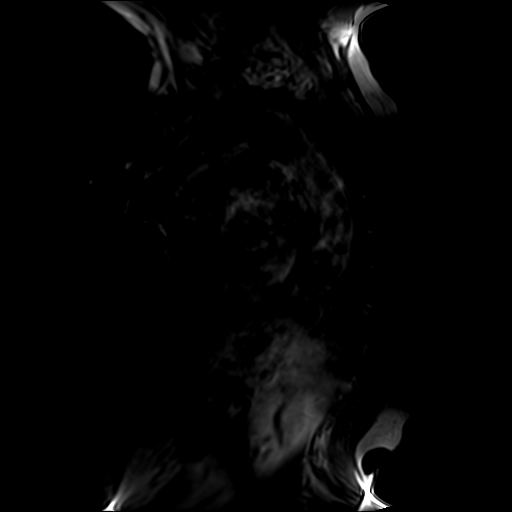
[im 13/13]
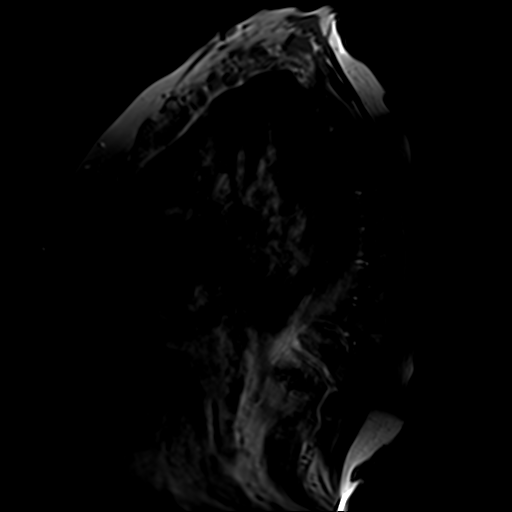

[27 of 48 positions shown; findings below may reference images not displayed]

This examination was performed at [HOSPITAL] at [HOSPITAL]. The interpretation will be provided by [REDACTED].

## 2012-04-05 DIAGNOSIS — R609 Edema, unspecified: Secondary | ICD-10-CM | POA: Diagnosis not present

## 2012-04-24 IMAGING — CR DG LUMBAR SPINE COMPLETE 4+V
5 series · 5 of 5 positions shown · non-contrast
Comparison: None

CLINICAL DATA: Low back pain, no injury.

LUMBAR SPINE - COMPLETE 4+ VIEW

[t l-spine a.p.]
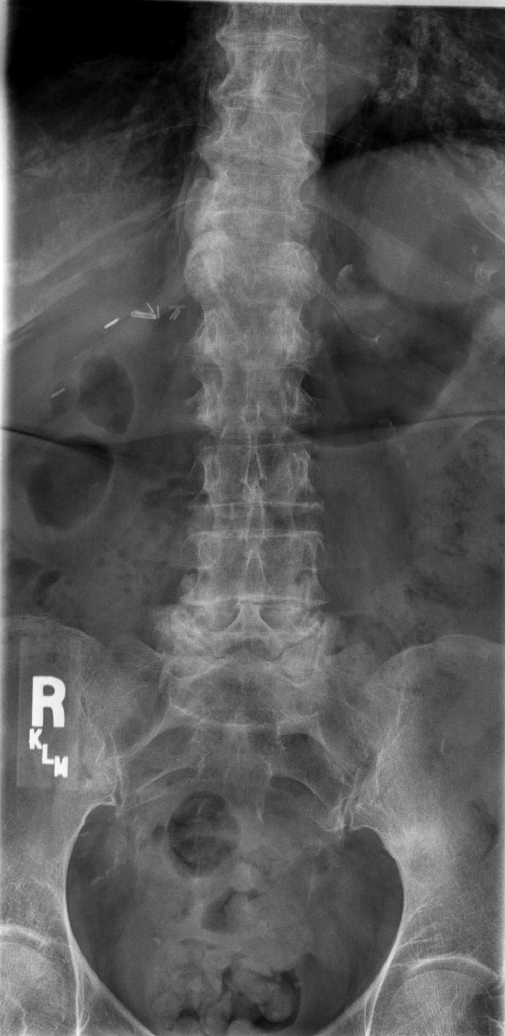

[t l-spine oblique exposure (1 of 2)]
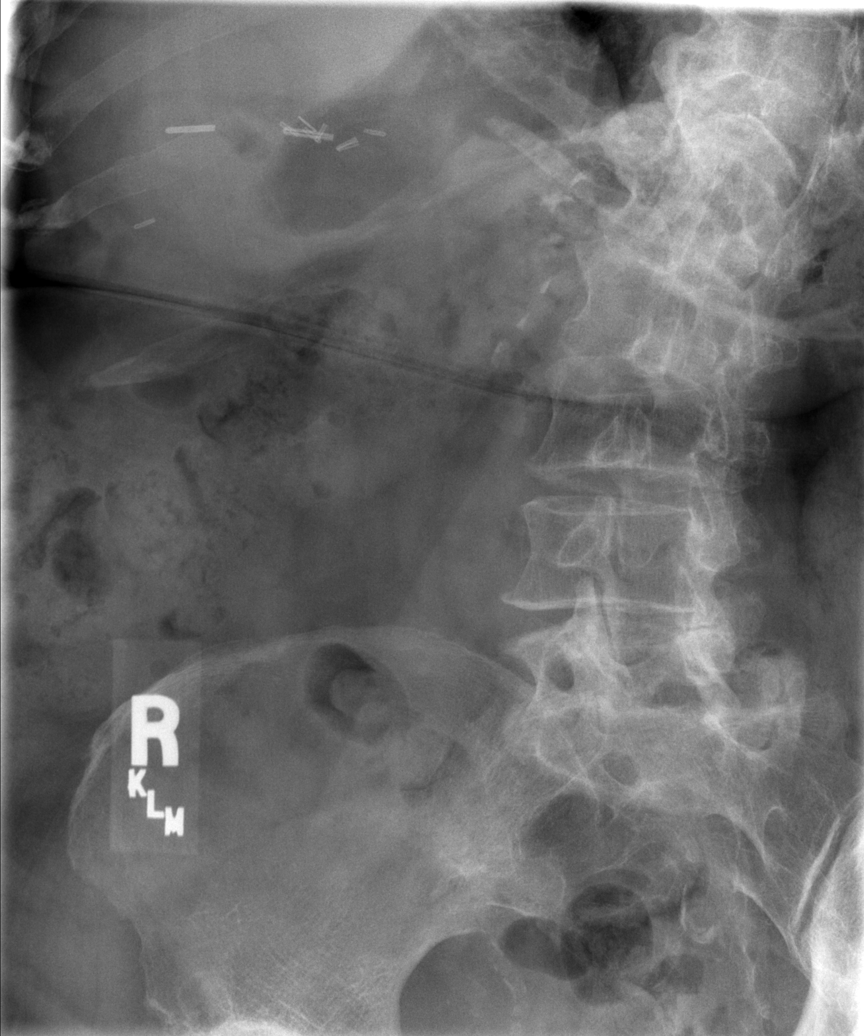

[t l-spine oblique exposure (2 of 2)]
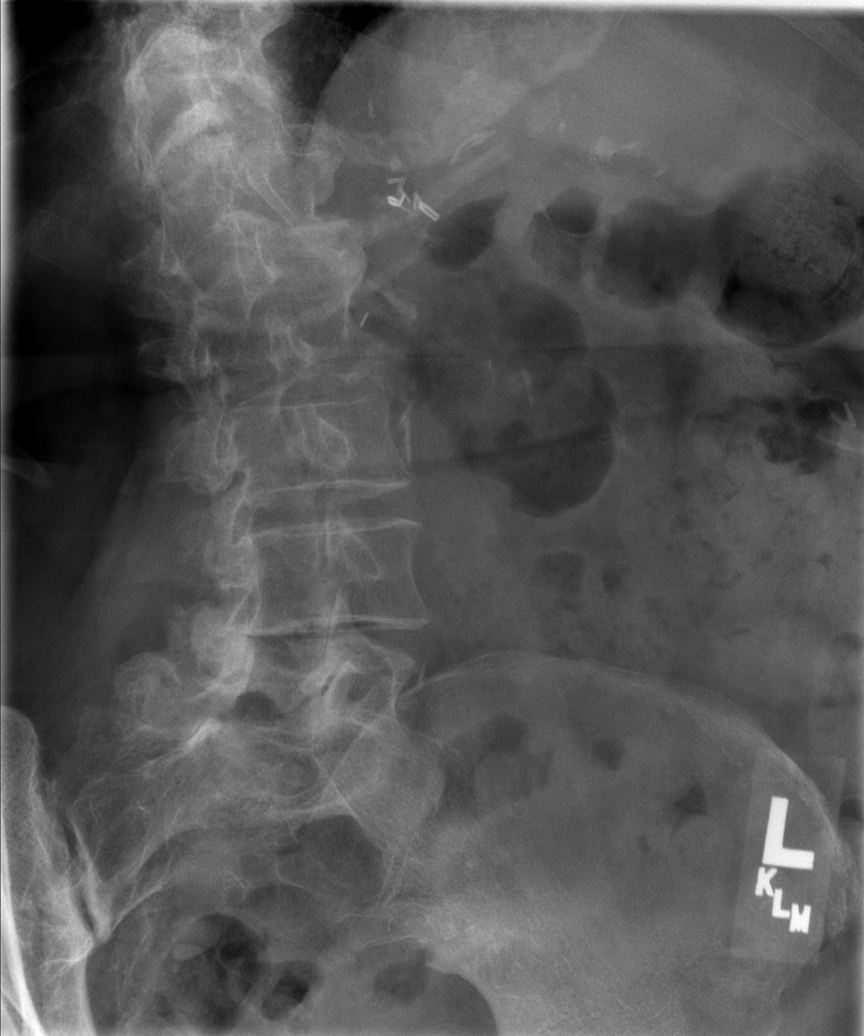

[t l-spine lat]
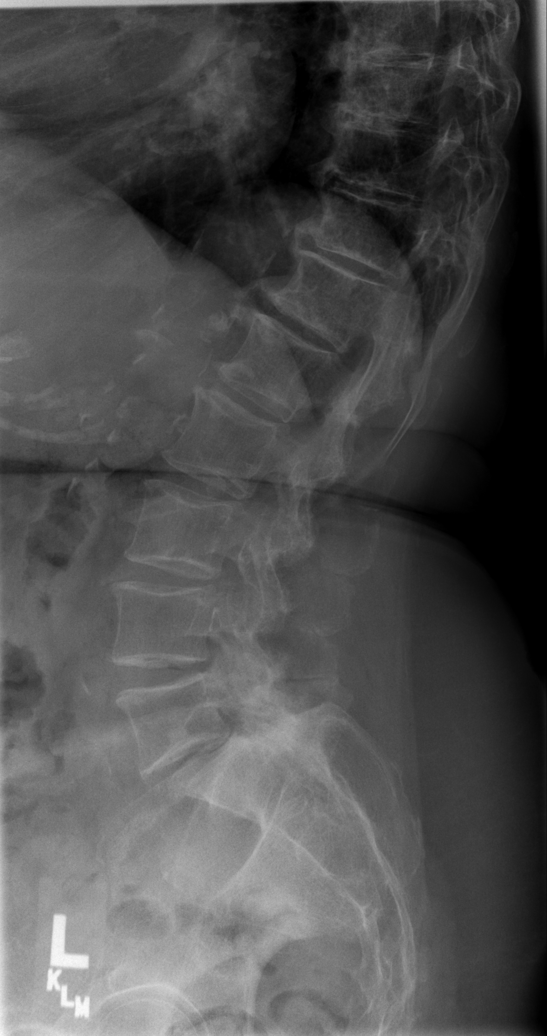

[t l-spine l5-s1 spot]
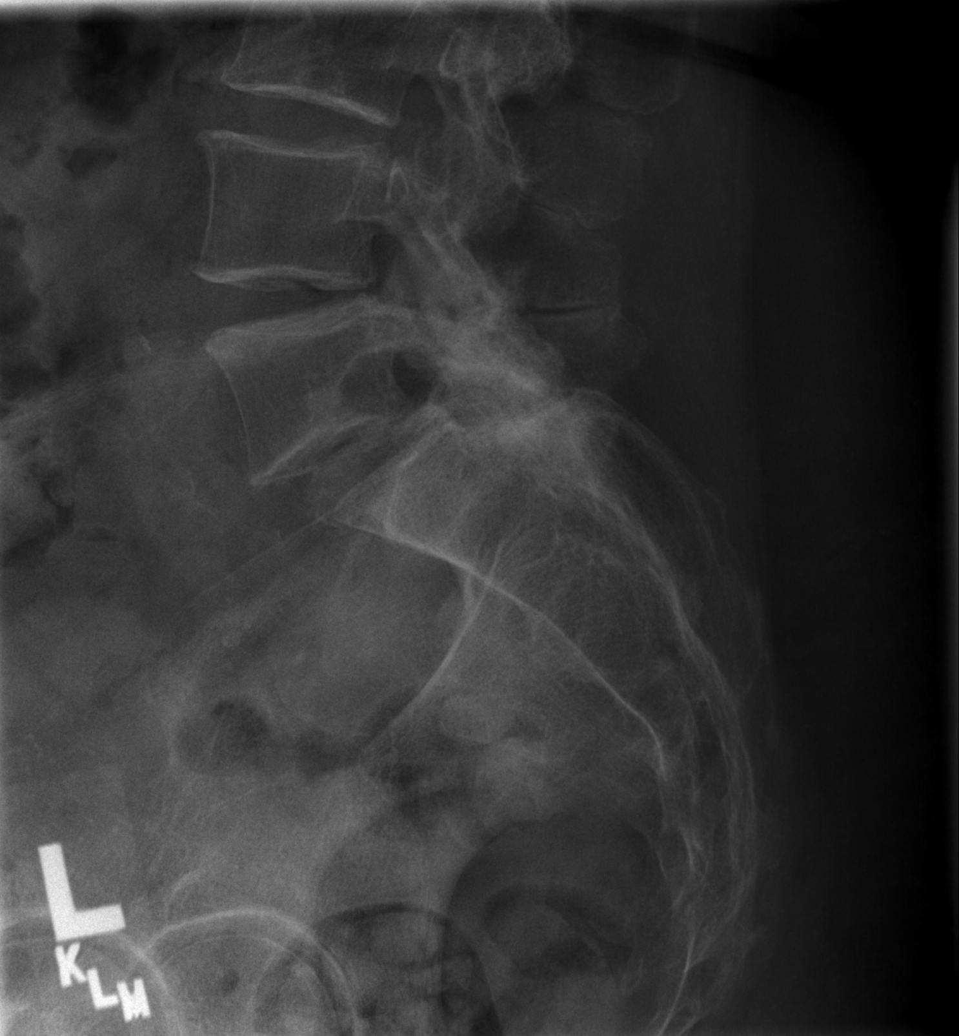

[5 of 5 positions shown; findings below may reference images not displayed]

FINDINGS: Advanced degenerative changes throughout the lumbar and
visualized lower thoracic spine.  Facet disease noted at the
lumbosacral junction with slight anterolisthesis of L5 on S1.  No
fracture acute subluxation.  There is diffuse osteopenia. Prior
cholecystectomy.

There is an area of sclerosis noted within the left iliac bone.
This is of unknown etiology.  May consider further evaluation with
pelvic CT or bone scan.
IMPRESSION: Advanced degenerative changes.

Sclerotic focus within the left iliac bone medially of unknown
etiology.  Consider further evaluation with CT pelvis or bone scan.

## 2012-05-13 DIAGNOSIS — Z23 Encounter for immunization: Secondary | ICD-10-CM | POA: Diagnosis not present

## 2012-05-30 DIAGNOSIS — R05 Cough: Secondary | ICD-10-CM | POA: Diagnosis not present

## 2012-05-30 DIAGNOSIS — J069 Acute upper respiratory infection, unspecified: Secondary | ICD-10-CM | POA: Diagnosis not present

## 2012-06-27 DIAGNOSIS — Z79899 Other long term (current) drug therapy: Secondary | ICD-10-CM | POA: Diagnosis not present

## 2012-06-27 DIAGNOSIS — E7800 Pure hypercholesterolemia, unspecified: Secondary | ICD-10-CM | POA: Diagnosis not present

## 2012-06-30 DIAGNOSIS — R209 Unspecified disturbances of skin sensation: Secondary | ICD-10-CM | POA: Diagnosis not present

## 2012-06-30 DIAGNOSIS — R32 Unspecified urinary incontinence: Secondary | ICD-10-CM | POA: Diagnosis not present

## 2012-06-30 DIAGNOSIS — E039 Hypothyroidism, unspecified: Secondary | ICD-10-CM | POA: Diagnosis not present

## 2012-06-30 DIAGNOSIS — M25559 Pain in unspecified hip: Secondary | ICD-10-CM | POA: Diagnosis not present

## 2012-07-11 ENCOUNTER — Ambulatory Visit (INDEPENDENT_AMBULATORY_CARE_PROVIDER_SITE_OTHER): Payer: Medicare Other | Admitting: Sports Medicine

## 2012-07-11 ENCOUNTER — Encounter: Payer: Self-pay | Admitting: Sports Medicine

## 2012-07-11 VITALS — BP 124/78 | HR 79 | Ht 61.0 in | Wt 184.0 lb

## 2012-07-11 DIAGNOSIS — M25559 Pain in unspecified hip: Secondary | ICD-10-CM

## 2012-07-11 NOTE — Progress Notes (Signed)
  Subjective:    Patient ID: Makayla Thomas, female    DOB: 1926/03/06, 78 y.o.   MRN: [id number]  HPI chief complaint: Right groin pain  78 year old female sent over at the request of Dr.Pharr for evaluation of right groin pain. Patient denies any trauma or rather describes an insidious onset of groin pain that began back in October. Pain is worse with activity, especially with walking. Does improve at rest. She also gets intermittent low back pain.She is angulating with the assistance of a walker. She denies any similar problems in the past. She is taking acetaminophen as needed at night for her pain. This does seem to help somewhat.  Medical history is significant for a prior CVA, MS, atrial fibrillation, peripheral vascular disease, hypercholesterolemia, hypothyroidism, hypertension, and sciatica Medications are reviewed She is allergic to tetanus antitoxin and meclizine Socially she does not drink alcohol, does not smoke, is widowed and retired    Review of Systems     Objective:   Physical Exam Well-developed, well-nourished. No acute distress. Awake alert and oriented x3. Sitting comfortably in the exam room.  Right hip: Full passive range of motion but the patient does have reproducible groin pain with full internal rotation. She is tender to palpation along the abductor muscle group but there is no soft tissue swelling. There is no tenderness to palpation along the greater trochanteric bursa. She has pain and as well as weakness with resisted hip flexion. Negative straight leg raise. She ambulates with an obvious limp and the assistance of a walker.        Assessment & Plan:  1. Right groin pain likely secondary to hip osteoarthritis  I reviewed the notes from Dr Carolee Rota office. Per those notes x-rays were obtained but they are not available for my immediate review. I've asked the patient to drop these x-rays off at my office at her earliest convenience for my review. I do think  she would benefit from physical therapy for a generalized bilateral lower extremity strengthening program. She would like to do this at [facility]. I will call her after reviewing her x-rays. We may discussed the merits of a diagnostic/therapeutic intra-articular cortisone injection. In the meantime, she'll continue with her Tylenol each bedtime.

## 2012-07-12 ENCOUNTER — Telehealth: Payer: Self-pay | Admitting: Sports Medicine

## 2012-07-12 NOTE — Telephone Encounter (Signed)
I spoke with Ms. Booher today on the phone after reviewing her x-rays from Dr.Pharr's office. An AP of her pelvis does show moderate degenerative changes of the right hip. She has acetabular spurring, joint space narrowing and sclerosis all consistent with DJD. Left hip appears fairly healthy. Patient was like to initially try physical therapy. She will followup with me in 3-4 weeks. If symptoms persist or worsen we may revisit the idea of an intra-articular cortisone injection.

## 2012-07-28 DIAGNOSIS — E7800 Pure hypercholesterolemia, unspecified: Secondary | ICD-10-CM | POA: Diagnosis not present

## 2012-07-28 DIAGNOSIS — R350 Frequency of micturition: Secondary | ICD-10-CM | POA: Diagnosis not present

## 2012-07-28 DIAGNOSIS — R32 Unspecified urinary incontinence: Secondary | ICD-10-CM | POA: Diagnosis not present

## 2012-07-28 IMAGING — CR DG KNEE 1-2V PORT*L*
2 series · 2 of 2 positions shown · non-contrast
Comparison: None.

CLINICAL DATA: Left knee osteoarthritis.  Postop from knee
arthroplasty.

PORTABLE LEFT KNEE - 1-2 VIEW

[view not recorded (1 of 2)]
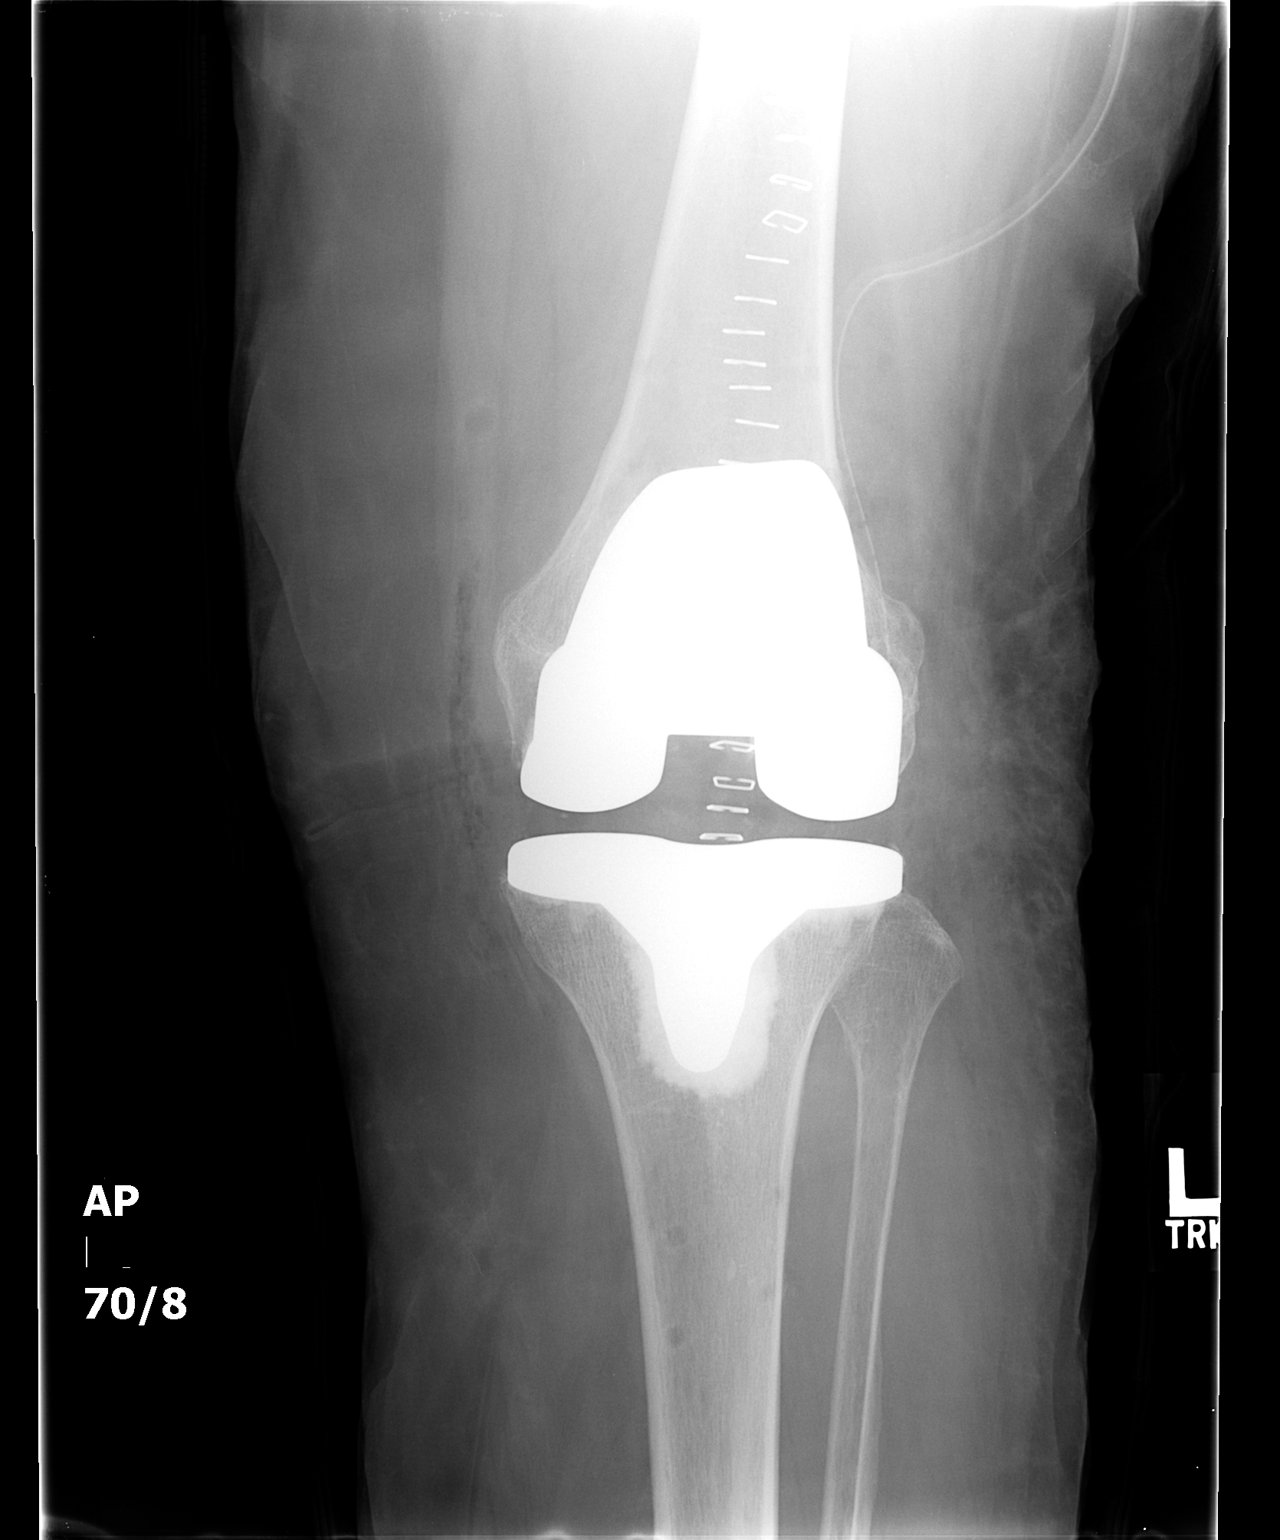

[view not recorded (2 of 2)]
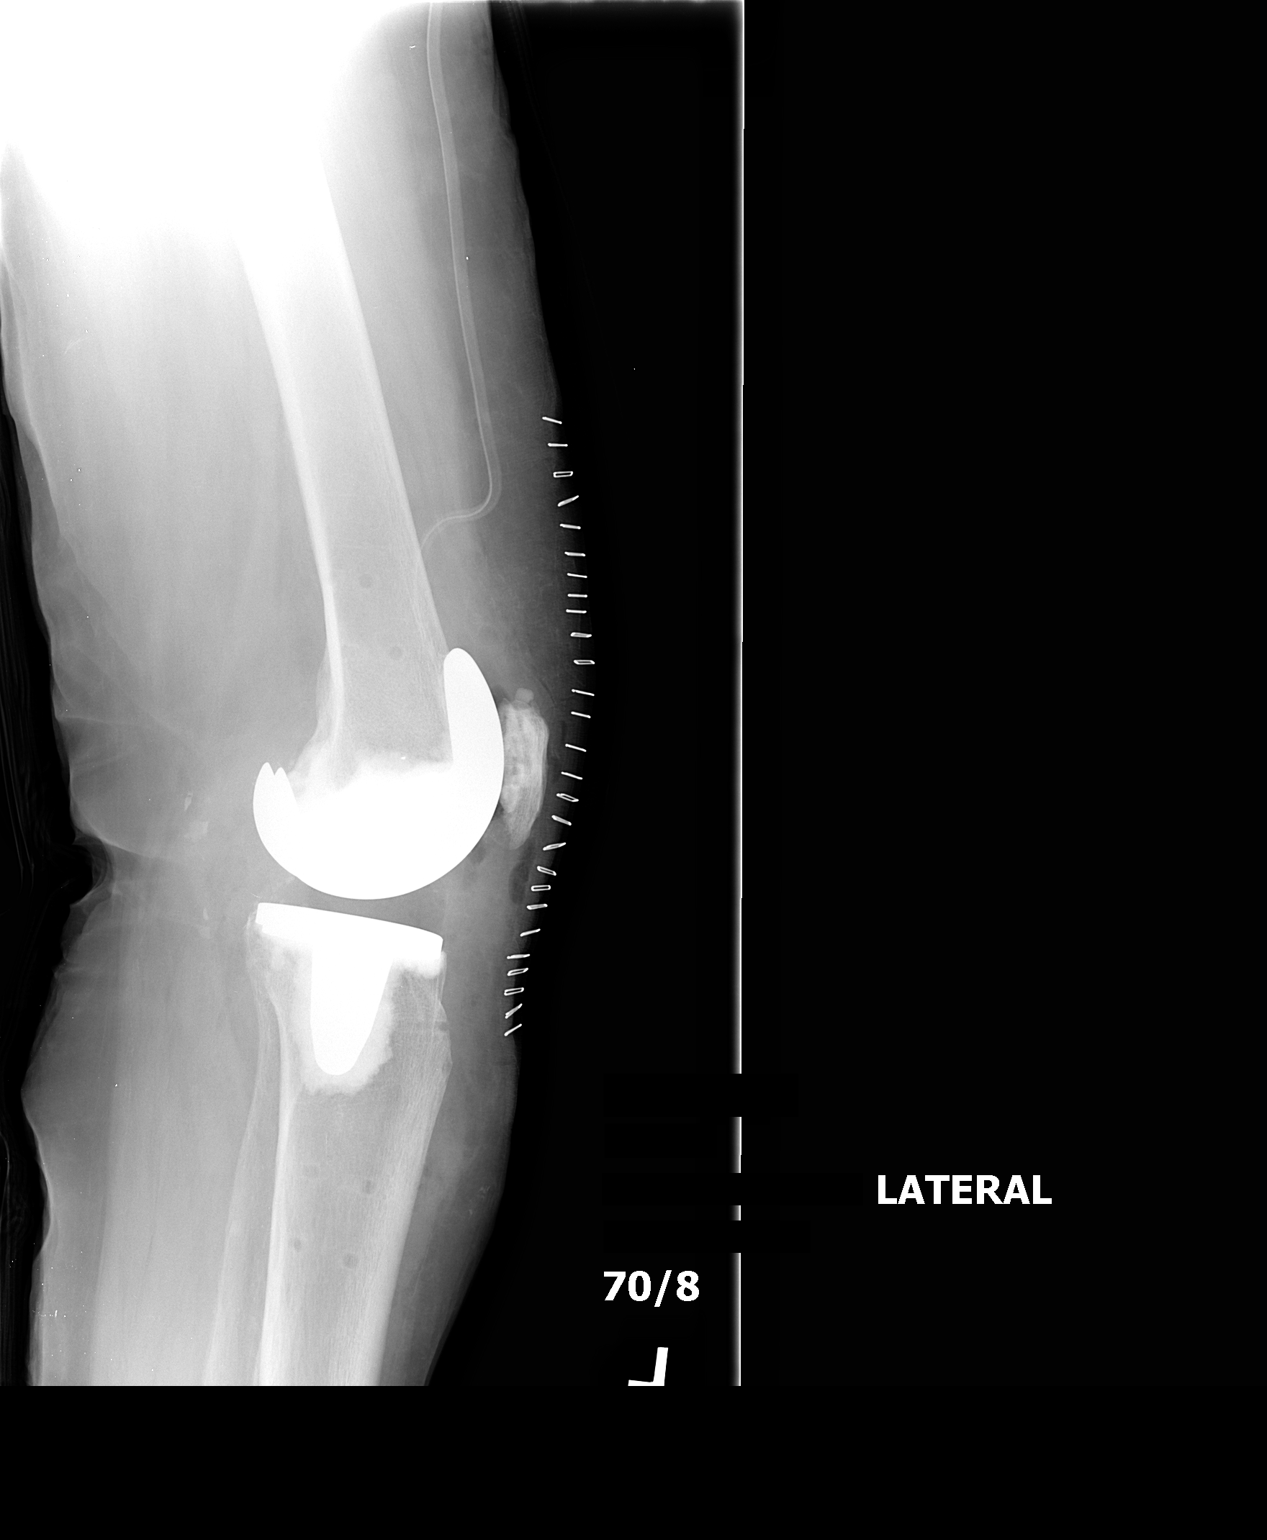

[2 of 2 positions shown; findings below may reference images not displayed]

FINDINGS: Total knee arthroplasty is seen in expected position.
Alignment is normal.  No evidence of fracture.  Old pin holes are
seen in the distal femur and proximal tibia.  Surgical drain is
seen in place as well as overlying skin staples.
IMPRESSION: Expected postoperative appearance of total knee arthroplasty.  No
evidence of fracture or dislocation.

## 2012-09-08 DIAGNOSIS — H2510 Age-related nuclear cataract, unspecified eye: Secondary | ICD-10-CM | POA: Diagnosis not present

## 2012-12-09 ENCOUNTER — Encounter: Payer: Self-pay | Admitting: Cardiology

## 2012-12-16 ENCOUNTER — Encounter: Payer: Self-pay | Admitting: Cardiology

## 2012-12-27 ENCOUNTER — Ambulatory Visit: Payer: Medicare Other | Admitting: Cardiology

## 2012-12-29 DIAGNOSIS — E039 Hypothyroidism, unspecified: Secondary | ICD-10-CM | POA: Diagnosis not present

## 2012-12-29 DIAGNOSIS — I10 Essential (primary) hypertension: Secondary | ICD-10-CM | POA: Diagnosis not present

## 2012-12-29 DIAGNOSIS — Z0000 Encounter for general adult medical examination without abnormal findings: Secondary | ICD-10-CM | POA: Diagnosis not present

## 2012-12-29 DIAGNOSIS — Z79899 Other long term (current) drug therapy: Secondary | ICD-10-CM | POA: Diagnosis not present

## 2012-12-29 DIAGNOSIS — E7800 Pure hypercholesterolemia, unspecified: Secondary | ICD-10-CM | POA: Diagnosis not present

## 2013-01-10 ENCOUNTER — Encounter: Payer: Self-pay | Admitting: Cardiology

## 2013-01-10 ENCOUNTER — Ambulatory Visit (INDEPENDENT_AMBULATORY_CARE_PROVIDER_SITE_OTHER): Payer: Medicare Other | Admitting: Cardiology

## 2013-01-10 VITALS — BP 100/70 | HR 68 | Ht 62.0 in | Wt 188.0 lb

## 2013-01-10 DIAGNOSIS — E785 Hyperlipidemia, unspecified: Secondary | ICD-10-CM

## 2013-01-10 DIAGNOSIS — Z7901 Long term (current) use of anticoagulants: Secondary | ICD-10-CM | POA: Insufficient documentation

## 2013-01-10 DIAGNOSIS — I350 Nonrheumatic aortic (valve) stenosis: Secondary | ICD-10-CM

## 2013-01-10 DIAGNOSIS — I10 Essential (primary) hypertension: Secondary | ICD-10-CM | POA: Insufficient documentation

## 2013-01-10 DIAGNOSIS — I4821 Permanent atrial fibrillation: Secondary | ICD-10-CM

## 2013-01-10 DIAGNOSIS — R609 Edema, unspecified: Secondary | ICD-10-CM

## 2013-01-10 DIAGNOSIS — I4891 Unspecified atrial fibrillation: Secondary | ICD-10-CM

## 2013-01-10 DIAGNOSIS — I359 Nonrheumatic aortic valve disorder, unspecified: Secondary | ICD-10-CM

## 2013-01-10 DIAGNOSIS — R600 Localized edema: Secondary | ICD-10-CM

## 2013-01-10 MED ORDER — HYDROCHLOROTHIAZIDE 25 MG PO TABS: 12.5000 mg | ORAL_TABLET | Freq: Every day | ORAL | Status: DC

## 2013-01-10 NOTE — Patient Instructions (Addendum)
As discussed, you seem to be doing quite well. Your blood pressure is a little low today so we're going to cut your hydrochlorothiazide (HCTZ) T. one half tablet daily.   Also passed on to her primary care provider is a concern for mild low blood pressure and cramping, there is concern for low potassium. I'm standard and blood work checked for that. If this is the case and fully stopping the HCTZ would be appropriate. Unit her fibrillation is well-controlled without any complications again I have any bleeding issues in the Xarelto.  We will simply continue to see you on an annual basis unless there are further questions or concerns.  Followup in 12 months.

## 2013-01-10 NOTE — Assessment & Plan Note (Signed)
Her rate is well-controlled with low-dose diltiazem. She remains and evaluated on Xarelto with no problems.

## 2013-01-10 NOTE — Assessment & Plan Note (Addendum)
On a combination of HCTZ and Lasix. I think we can probably just use Lasix as her blood pressure is a little low.  This would also reduce concern for possible hypokalemia related cramping.

## 2013-01-10 NOTE — Progress Notes (Signed)
[facility]  Clinic Note:  Patient ID: Makayla Thomas, female   DOB: 10-23-1925, 79 y.o.   MRN: [id number]  HPI: Makayla Thomas is a 79 y.o. female with a PMH below who presents today for followup of her chronic atrial fibrillation, hypertension, chronic lotion edema and PAD.[name]  Interval History: She states she's feeling great has no major complaints. She is had some mild cramping as well some issues with potassium levels and I think your spironolactone dose has been stopped. She now says cramps that are coarse walking but the house when she stretches them. This could be her labs rechecked by her primary physician.  Otherwise she denies any sensation of palpitations or syncope/near-syncope. She does not notice that she is in chronic atrial fibrillation. No heart or symptoms of edema, PND or orthopnea. No chest tightness or pressure or shortness of breath with rest exertion. She stable on Xarelto with no melena, hematochezia, hematuria or nosebleeds. Denies any TIA or amaurosis fugax symptoms.  She genuinely feels last to be doing as well as she is doing. She does not have any complaints and plans to reach the right age of 69.  Past Medical History  Diagnosis Date  . Permanent atrial fibrillation   . Chronic anticoagulation     on Xarelto  . H/O: CVA (cerebrovascular accident)   . Hypothyroidism   . Dyslipidemia     treated  . PAD (peripheral artery disease) 08/30/2009    Dopplers; with bil. post. tib artery occlusion  . Lower extremity edema     chronic  . Aortic stenosis, mild 08/2009    ECHO  EF 55-60%, wth increased EDP, mild   . H/O cardiovascular stress test 05/2010    Lexiscan cardiolite no ischemia or infarction  . History of ETT 02/2011    [place] exercise protocol, negative with poor exercise tolerance  . H/O multiple sclerosis     no excaerbations in some time   Allergies  Allergen Reactions  . Tetanus Antitoxin Anaphylaxis    Throat swelling   . Meclizine Other (See Comments)    Had funny feeling    Current Outpatient Prescriptions  Medication Sig Dispense Refill  . acetaminophen (TYLENOL) 500 MG tablet Take 500 mg by mouth every 6 (six) hours as needed for pain.      [name] alendronate (FOSAMAX) 70 MG tablet Take 70 mg by mouth Every month.      [name] BIOTIN PO Take 1,000 mcg by mouth.      . bisoprolol (ZEBETA) 10 MG tablet Take 10 mg by mouth daily.      [name] diltiazem (CARDIZEM CD) 120 MG 24 hr capsule Take 120 mg by mouth daily.      . furosemide (LASIX) 20 MG tablet Take 20 mg by mouth daily.      . hydrochlorothiazide (HYDRODIURIL) 25 MG tablet Take 0.5 tablets (12.5 mg total) by mouth daily.  30 tablet  12  . mirabegron ER (MYRBETRIQ) 25 MG TB24 Take 25 mg by mouth daily.      . Pitavastatin Calcium (LIVALO) 1 MG TABS Take 1 mg by mouth daily.      [name] SYNTHROID 125 MCG tablet Take 125 mcg by mouth daily.      Carlena Hurl 15 MG TABS tablet Take 150 mg by mouth daily.      [name] spironolactone (ALDACTONE) 50 MG tablet Take 50 mg by mouth 2 (two) times daily.       No current facility-administered  medications for this visit.    History   Social History  . Marital Status: Widowed    Spouse Name: N/A    Number of Children: 3  . Years of Education: N/A   Occupational History  . Not on file.   Social History Main Topics  . Smoking status: Never Smoker   . Smokeless tobacco: Never Used  . Alcohol Use: No  . Drug Use: No  . Sexually Active: Not on file   Other Topics Concern  . Not on file   Social History Narrative   Uses a walker to walk    ROS: A comprehensive Review of Systems - Negative except The cramping noted above and mild varicosities.  PHYSICAL EXAM BP 100/70  Pulse 68  Ht 5\' 2"  (1.575 m)  Wt 188 lb (85.276 kg)  BMI 34.38 kg/m2 General appearance: alert, cooperative, appears stated age, no distress, mildly obese and Very pleasant mood and affect Neck: no adenopathy, no carotid bruit, no JVD, supple,  symmetrical, trachea midline, thyroid not enlarged, symmetric, no tenderness/mass/nodules and HEENT: Wears glasses, [place]/AT, MMM, anicteric sclera Lungs: clear to auscultation bilaterally, normal percussion bilaterally and Nonlabored Heart: normal apical impulse, irregularly irregular rhythm, no S3 or S4, no rub and Controlled rate Abdomen: soft, non-tender; bowel sounds normal; no masses,  no organomegaly and Mildly obese Extremities: edema Trace to 1+, stable Pulses: Reduced PT pulses bilaterally.  [id number] today: Yes Rate: 68 , Rhythm: Atrial fibrillation with controlled ventricular rate;  otherwise normal at ECG with no significant changes.  ASSESSMENT: Relatively stable.  Patient Active Problem List   Diagnosis Date Noted  . Permanent atrial fibrillation     Priority: High  . Chronic anticoagulation     Priority: High  . Dyslipidemia     Priority: Medium  . Lower extremity edema     Priority: Medium  . Aortic stenosis, mild 08/17/2009    Priority: Medium  . Essential hypertension -well controlled 01/10/2013    Priority: Low    PLAN: Per problem list.  Followup: 12 months  Laureano Hetzer W, M.D., M.S. [facility] [address] [place], [place]  [address]  [phone] Pager # [phone] 01/10/2013 11:34 AM

## 2013-01-10 NOTE — Assessment & Plan Note (Signed)
On Livalo- followed by PCP 

## 2013-01-10 NOTE — Assessment & Plan Note (Signed)
Continue the Xarelto. No signs of anemia and no signs of bleeding.

## 2013-01-10 NOTE — Assessment & Plan Note (Signed)
No active symptoms. Could consider followup echocardiogram after 2015 visit.

## 2013-01-10 NOTE — Assessment & Plan Note (Addendum)
Hypotensive today. Cut HCTZ in half. If cramping is contacting to be concerning, and potassium is low. Would consider simply stopping the HCTZ.

## 2013-01-12 DIAGNOSIS — M810 Age-related osteoporosis without current pathological fracture: Secondary | ICD-10-CM | POA: Diagnosis not present

## 2013-01-12 DIAGNOSIS — E039 Hypothyroidism, unspecified: Secondary | ICD-10-CM | POA: Diagnosis not present

## 2013-01-12 DIAGNOSIS — Z1212 Encounter for screening for malignant neoplasm of rectum: Secondary | ICD-10-CM | POA: Diagnosis not present

## 2013-01-12 DIAGNOSIS — Z0000 Encounter for general adult medical examination without abnormal findings: Secondary | ICD-10-CM | POA: Diagnosis not present

## 2013-01-23 ENCOUNTER — Other Ambulatory Visit: Payer: Self-pay

## 2013-01-23 DIAGNOSIS — Z1231 Encounter for screening mammogram for malignant neoplasm of breast: Secondary | ICD-10-CM

## 2013-02-28 ENCOUNTER — Ambulatory Visit
Admission: RE | Admit: 2013-02-28 | Discharge: 2013-02-28 | Disposition: A | Discharge status: Home / Self Care | Payer: Medicare Other | Source: Ambulatory Visit

## 2013-02-28 DIAGNOSIS — Z1231 Encounter for screening mammogram for malignant neoplasm of breast: Secondary | ICD-10-CM

## 2013-03-08 DIAGNOSIS — S8000XA Contusion of unspecified knee, initial encounter: Secondary | ICD-10-CM | POA: Diagnosis not present

## 2013-03-08 DIAGNOSIS — M1710 Unilateral primary osteoarthritis, unspecified knee: Secondary | ICD-10-CM | POA: Diagnosis not present

## 2013-03-15 DIAGNOSIS — M25569 Pain in unspecified knee: Secondary | ICD-10-CM | POA: Diagnosis not present

## 2013-03-15 DIAGNOSIS — R269 Unspecified abnormalities of gait and mobility: Secondary | ICD-10-CM | POA: Diagnosis not present

## 2013-03-15 DIAGNOSIS — M1710 Unilateral primary osteoarthritis, unspecified knee: Secondary | ICD-10-CM | POA: Diagnosis not present

## 2013-03-15 DIAGNOSIS — M6281 Muscle weakness (generalized): Secondary | ICD-10-CM | POA: Diagnosis not present

## 2013-03-15 DIAGNOSIS — I4891 Unspecified atrial fibrillation: Secondary | ICD-10-CM | POA: Diagnosis not present

## 2013-03-15 DIAGNOSIS — IMO0001 Reserved for inherently not codable concepts without codable children: Secondary | ICD-10-CM | POA: Diagnosis not present

## 2013-03-16 DIAGNOSIS — H2510 Age-related nuclear cataract, unspecified eye: Secondary | ICD-10-CM | POA: Diagnosis not present

## 2013-03-17 DIAGNOSIS — M6281 Muscle weakness (generalized): Secondary | ICD-10-CM | POA: Diagnosis not present

## 2013-03-17 DIAGNOSIS — M25569 Pain in unspecified knee: Secondary | ICD-10-CM | POA: Diagnosis not present

## 2013-03-17 DIAGNOSIS — I4891 Unspecified atrial fibrillation: Secondary | ICD-10-CM | POA: Diagnosis not present

## 2013-03-17 DIAGNOSIS — IMO0001 Reserved for inherently not codable concepts without codable children: Secondary | ICD-10-CM | POA: Diagnosis not present

## 2013-03-17 DIAGNOSIS — R269 Unspecified abnormalities of gait and mobility: Secondary | ICD-10-CM | POA: Diagnosis not present

## 2013-03-20 DIAGNOSIS — M6281 Muscle weakness (generalized): Secondary | ICD-10-CM | POA: Diagnosis not present

## 2013-03-20 DIAGNOSIS — M25569 Pain in unspecified knee: Secondary | ICD-10-CM | POA: Diagnosis not present

## 2013-03-20 DIAGNOSIS — IMO0001 Reserved for inherently not codable concepts without codable children: Secondary | ICD-10-CM | POA: Diagnosis not present

## 2013-03-20 DIAGNOSIS — I4891 Unspecified atrial fibrillation: Secondary | ICD-10-CM | POA: Diagnosis not present

## 2013-03-20 DIAGNOSIS — R269 Unspecified abnormalities of gait and mobility: Secondary | ICD-10-CM | POA: Diagnosis not present

## 2013-03-22 DIAGNOSIS — I4891 Unspecified atrial fibrillation: Secondary | ICD-10-CM | POA: Diagnosis not present

## 2013-03-22 DIAGNOSIS — M25569 Pain in unspecified knee: Secondary | ICD-10-CM | POA: Diagnosis not present

## 2013-03-22 DIAGNOSIS — IMO0001 Reserved for inherently not codable concepts without codable children: Secondary | ICD-10-CM | POA: Diagnosis not present

## 2013-03-22 DIAGNOSIS — M6281 Muscle weakness (generalized): Secondary | ICD-10-CM | POA: Diagnosis not present

## 2013-03-22 DIAGNOSIS — R269 Unspecified abnormalities of gait and mobility: Secondary | ICD-10-CM | POA: Diagnosis not present

## 2013-03-24 DIAGNOSIS — R269 Unspecified abnormalities of gait and mobility: Secondary | ICD-10-CM | POA: Diagnosis not present

## 2013-03-24 DIAGNOSIS — M6281 Muscle weakness (generalized): Secondary | ICD-10-CM | POA: Diagnosis not present

## 2013-03-24 DIAGNOSIS — I4891 Unspecified atrial fibrillation: Secondary | ICD-10-CM | POA: Diagnosis not present

## 2013-03-24 DIAGNOSIS — M25569 Pain in unspecified knee: Secondary | ICD-10-CM | POA: Diagnosis not present

## 2013-03-24 DIAGNOSIS — IMO0001 Reserved for inherently not codable concepts without codable children: Secondary | ICD-10-CM | POA: Diagnosis not present

## 2013-03-28 DIAGNOSIS — M25569 Pain in unspecified knee: Secondary | ICD-10-CM | POA: Diagnosis not present

## 2013-03-28 DIAGNOSIS — M6281 Muscle weakness (generalized): Secondary | ICD-10-CM | POA: Diagnosis not present

## 2013-03-28 DIAGNOSIS — R269 Unspecified abnormalities of gait and mobility: Secondary | ICD-10-CM | POA: Diagnosis not present

## 2013-03-28 DIAGNOSIS — I4891 Unspecified atrial fibrillation: Secondary | ICD-10-CM | POA: Diagnosis not present

## 2013-03-28 DIAGNOSIS — IMO0001 Reserved for inherently not codable concepts without codable children: Secondary | ICD-10-CM | POA: Diagnosis not present

## 2013-03-30 DIAGNOSIS — R269 Unspecified abnormalities of gait and mobility: Secondary | ICD-10-CM | POA: Diagnosis not present

## 2013-03-30 DIAGNOSIS — IMO0001 Reserved for inherently not codable concepts without codable children: Secondary | ICD-10-CM | POA: Diagnosis not present

## 2013-03-30 DIAGNOSIS — I4891 Unspecified atrial fibrillation: Secondary | ICD-10-CM | POA: Diagnosis not present

## 2013-03-30 DIAGNOSIS — M6281 Muscle weakness (generalized): Secondary | ICD-10-CM | POA: Diagnosis not present

## 2013-03-30 DIAGNOSIS — M25569 Pain in unspecified knee: Secondary | ICD-10-CM | POA: Diagnosis not present

## 2013-04-03 ENCOUNTER — Other Ambulatory Visit: Payer: Self-pay | Admitting: Cardiology

## 2013-04-03 NOTE — Telephone Encounter (Signed)
Rx was sent to pharmacy electronically. 

## 2013-04-05 DIAGNOSIS — I4891 Unspecified atrial fibrillation: Secondary | ICD-10-CM | POA: Diagnosis not present

## 2013-04-05 DIAGNOSIS — R269 Unspecified abnormalities of gait and mobility: Secondary | ICD-10-CM | POA: Diagnosis not present

## 2013-04-05 DIAGNOSIS — M6281 Muscle weakness (generalized): Secondary | ICD-10-CM | POA: Diagnosis not present

## 2013-04-05 DIAGNOSIS — IMO0001 Reserved for inherently not codable concepts without codable children: Secondary | ICD-10-CM | POA: Diagnosis not present

## 2013-04-05 DIAGNOSIS — M25569 Pain in unspecified knee: Secondary | ICD-10-CM | POA: Diagnosis not present

## 2013-04-07 DIAGNOSIS — R269 Unspecified abnormalities of gait and mobility: Secondary | ICD-10-CM | POA: Diagnosis not present

## 2013-04-07 DIAGNOSIS — I4891 Unspecified atrial fibrillation: Secondary | ICD-10-CM | POA: Diagnosis not present

## 2013-04-07 DIAGNOSIS — IMO0001 Reserved for inherently not codable concepts without codable children: Secondary | ICD-10-CM | POA: Diagnosis not present

## 2013-04-07 DIAGNOSIS — M25569 Pain in unspecified knee: Secondary | ICD-10-CM | POA: Diagnosis not present

## 2013-04-07 DIAGNOSIS — M6281 Muscle weakness (generalized): Secondary | ICD-10-CM | POA: Diagnosis not present

## 2013-04-11 DIAGNOSIS — R269 Unspecified abnormalities of gait and mobility: Secondary | ICD-10-CM | POA: Diagnosis not present

## 2013-04-11 DIAGNOSIS — I4891 Unspecified atrial fibrillation: Secondary | ICD-10-CM | POA: Diagnosis not present

## 2013-04-11 DIAGNOSIS — IMO0001 Reserved for inherently not codable concepts without codable children: Secondary | ICD-10-CM | POA: Diagnosis not present

## 2013-04-11 DIAGNOSIS — M25569 Pain in unspecified knee: Secondary | ICD-10-CM | POA: Diagnosis not present

## 2013-04-11 DIAGNOSIS — M6281 Muscle weakness (generalized): Secondary | ICD-10-CM | POA: Diagnosis not present

## 2013-04-13 DIAGNOSIS — R269 Unspecified abnormalities of gait and mobility: Secondary | ICD-10-CM | POA: Diagnosis not present

## 2013-04-13 DIAGNOSIS — M6281 Muscle weakness (generalized): Secondary | ICD-10-CM | POA: Diagnosis not present

## 2013-04-13 DIAGNOSIS — M25569 Pain in unspecified knee: Secondary | ICD-10-CM | POA: Diagnosis not present

## 2013-04-13 DIAGNOSIS — I4891 Unspecified atrial fibrillation: Secondary | ICD-10-CM | POA: Diagnosis not present

## 2013-04-13 DIAGNOSIS — IMO0001 Reserved for inherently not codable concepts without codable children: Secondary | ICD-10-CM | POA: Diagnosis not present

## 2013-04-14 DIAGNOSIS — I10 Essential (primary) hypertension: Secondary | ICD-10-CM | POA: Diagnosis not present

## 2013-04-14 DIAGNOSIS — M25569 Pain in unspecified knee: Secondary | ICD-10-CM | POA: Diagnosis not present

## 2013-04-18 DIAGNOSIS — M6281 Muscle weakness (generalized): Secondary | ICD-10-CM | POA: Diagnosis not present

## 2013-04-18 DIAGNOSIS — I4891 Unspecified atrial fibrillation: Secondary | ICD-10-CM | POA: Diagnosis not present

## 2013-04-18 DIAGNOSIS — R269 Unspecified abnormalities of gait and mobility: Secondary | ICD-10-CM | POA: Diagnosis not present

## 2013-04-18 DIAGNOSIS — IMO0001 Reserved for inherently not codable concepts without codable children: Secondary | ICD-10-CM | POA: Diagnosis not present

## 2013-04-18 DIAGNOSIS — M25569 Pain in unspecified knee: Secondary | ICD-10-CM | POA: Diagnosis not present

## 2013-04-20 ENCOUNTER — Other Ambulatory Visit (HOSPITAL_COMMUNITY): Payer: Self-pay | Admitting: Orthopedic Surgery

## 2013-04-20 DIAGNOSIS — W19XXXA Unspecified fall, initial encounter: Secondary | ICD-10-CM | POA: Diagnosis not present

## 2013-04-20 DIAGNOSIS — IMO0001 Reserved for inherently not codable concepts without codable children: Secondary | ICD-10-CM | POA: Diagnosis not present

## 2013-04-20 DIAGNOSIS — M25562 Pain in left knee: Secondary | ICD-10-CM

## 2013-04-20 DIAGNOSIS — R29898 Other symptoms and signs involving the musculoskeletal system: Secondary | ICD-10-CM | POA: Diagnosis not present

## 2013-04-20 DIAGNOSIS — R269 Unspecified abnormalities of gait and mobility: Secondary | ICD-10-CM | POA: Diagnosis not present

## 2013-04-20 DIAGNOSIS — M25569 Pain in unspecified knee: Secondary | ICD-10-CM | POA: Diagnosis not present

## 2013-04-20 DIAGNOSIS — M6281 Muscle weakness (generalized): Secondary | ICD-10-CM | POA: Diagnosis not present

## 2013-04-20 DIAGNOSIS — I4891 Unspecified atrial fibrillation: Secondary | ICD-10-CM | POA: Diagnosis not present

## 2013-04-25 DIAGNOSIS — I4891 Unspecified atrial fibrillation: Secondary | ICD-10-CM | POA: Diagnosis not present

## 2013-04-25 DIAGNOSIS — R269 Unspecified abnormalities of gait and mobility: Secondary | ICD-10-CM | POA: Diagnosis not present

## 2013-04-25 DIAGNOSIS — IMO0001 Reserved for inherently not codable concepts without codable children: Secondary | ICD-10-CM | POA: Diagnosis not present

## 2013-04-25 DIAGNOSIS — M6281 Muscle weakness (generalized): Secondary | ICD-10-CM | POA: Diagnosis not present

## 2013-04-25 DIAGNOSIS — M25569 Pain in unspecified knee: Secondary | ICD-10-CM | POA: Diagnosis not present

## 2013-04-27 ENCOUNTER — Encounter (HOSPITAL_COMMUNITY)
Admission: RE | Admit: 2013-04-27 | Discharge: 2013-04-27 | Disposition: A | Discharge status: Home / Self Care | Payer: Medicare Other | Source: Ambulatory Visit | Attending: Orthopedic Surgery | Admitting: Orthopedic Surgery

## 2013-04-27 ENCOUNTER — Encounter (HOSPITAL_COMMUNITY): Admission: RE | Admit: 2013-04-27 | Payer: Medicare Other | Source: Ambulatory Visit

## 2013-04-27 DIAGNOSIS — M25569 Pain in unspecified knee: Secondary | ICD-10-CM | POA: Diagnosis not present

## 2013-04-27 DIAGNOSIS — Z96659 Presence of unspecified artificial knee joint: Secondary | ICD-10-CM | POA: Diagnosis not present

## 2013-04-27 DIAGNOSIS — M25562 Pain in left knee: Secondary | ICD-10-CM

## 2013-04-27 MED ORDER — TECHNETIUM TC 99M MEDRONATE IV KIT
25.0000 | PACK | Freq: Once | INTRAVENOUS | Status: AC | PRN
  Administered 2013-04-27: 25 via INTRAVENOUS

## 2013-04-28 DIAGNOSIS — I4891 Unspecified atrial fibrillation: Secondary | ICD-10-CM | POA: Diagnosis not present

## 2013-04-28 DIAGNOSIS — M6281 Muscle weakness (generalized): Secondary | ICD-10-CM | POA: Diagnosis not present

## 2013-04-28 DIAGNOSIS — IMO0001 Reserved for inherently not codable concepts without codable children: Secondary | ICD-10-CM | POA: Diagnosis not present

## 2013-04-28 DIAGNOSIS — M25569 Pain in unspecified knee: Secondary | ICD-10-CM | POA: Diagnosis not present

## 2013-04-28 DIAGNOSIS — R269 Unspecified abnormalities of gait and mobility: Secondary | ICD-10-CM | POA: Diagnosis not present

## 2013-05-02 DIAGNOSIS — M6281 Muscle weakness (generalized): Secondary | ICD-10-CM | POA: Diagnosis not present

## 2013-05-02 DIAGNOSIS — R269 Unspecified abnormalities of gait and mobility: Secondary | ICD-10-CM | POA: Diagnosis not present

## 2013-05-05 DIAGNOSIS — M6281 Muscle weakness (generalized): Secondary | ICD-10-CM | POA: Diagnosis not present

## 2013-05-05 DIAGNOSIS — R269 Unspecified abnormalities of gait and mobility: Secondary | ICD-10-CM | POA: Diagnosis not present

## 2013-05-09 DIAGNOSIS — M6281 Muscle weakness (generalized): Secondary | ICD-10-CM | POA: Diagnosis not present

## 2013-05-09 DIAGNOSIS — R269 Unspecified abnormalities of gait and mobility: Secondary | ICD-10-CM | POA: Diagnosis not present

## 2013-05-12 DIAGNOSIS — M6281 Muscle weakness (generalized): Secondary | ICD-10-CM | POA: Diagnosis not present

## 2013-05-12 DIAGNOSIS — R269 Unspecified abnormalities of gait and mobility: Secondary | ICD-10-CM | POA: Diagnosis not present

## 2013-05-16 DIAGNOSIS — R269 Unspecified abnormalities of gait and mobility: Secondary | ICD-10-CM | POA: Diagnosis not present

## 2013-05-16 DIAGNOSIS — M25569 Pain in unspecified knee: Secondary | ICD-10-CM | POA: Diagnosis not present

## 2013-05-16 DIAGNOSIS — M6281 Muscle weakness (generalized): Secondary | ICD-10-CM | POA: Diagnosis not present

## 2013-05-17 DIAGNOSIS — H2510 Age-related nuclear cataract, unspecified eye: Secondary | ICD-10-CM | POA: Diagnosis not present

## 2013-05-18 DIAGNOSIS — M6281 Muscle weakness (generalized): Secondary | ICD-10-CM | POA: Diagnosis not present

## 2013-05-18 DIAGNOSIS — R269 Unspecified abnormalities of gait and mobility: Secondary | ICD-10-CM | POA: Diagnosis not present

## 2013-05-22 DIAGNOSIS — H2589 Other age-related cataract: Secondary | ICD-10-CM | POA: Diagnosis not present

## 2013-05-22 DIAGNOSIS — H269 Unspecified cataract: Secondary | ICD-10-CM | POA: Diagnosis not present

## 2013-05-22 DIAGNOSIS — H2510 Age-related nuclear cataract, unspecified eye: Secondary | ICD-10-CM | POA: Diagnosis not present

## 2013-05-30 DIAGNOSIS — M6281 Muscle weakness (generalized): Secondary | ICD-10-CM | POA: Diagnosis not present

## 2013-05-30 DIAGNOSIS — R269 Unspecified abnormalities of gait and mobility: Secondary | ICD-10-CM | POA: Diagnosis not present

## 2013-05-31 DIAGNOSIS — H2510 Age-related nuclear cataract, unspecified eye: Secondary | ICD-10-CM | POA: Diagnosis not present

## 2013-06-01 DIAGNOSIS — R269 Unspecified abnormalities of gait and mobility: Secondary | ICD-10-CM | POA: Diagnosis not present

## 2013-06-01 DIAGNOSIS — M6281 Muscle weakness (generalized): Secondary | ICD-10-CM | POA: Diagnosis not present

## 2013-06-06 DIAGNOSIS — M6281 Muscle weakness (generalized): Secondary | ICD-10-CM | POA: Diagnosis not present

## 2013-06-06 DIAGNOSIS — R269 Unspecified abnormalities of gait and mobility: Secondary | ICD-10-CM | POA: Diagnosis not present

## 2013-06-13 DIAGNOSIS — R269 Unspecified abnormalities of gait and mobility: Secondary | ICD-10-CM | POA: Diagnosis not present

## 2013-06-13 DIAGNOSIS — M6281 Muscle weakness (generalized): Secondary | ICD-10-CM | POA: Diagnosis not present

## 2013-06-15 DIAGNOSIS — M6281 Muscle weakness (generalized): Secondary | ICD-10-CM | POA: Diagnosis not present

## 2013-06-15 DIAGNOSIS — R269 Unspecified abnormalities of gait and mobility: Secondary | ICD-10-CM | POA: Diagnosis not present

## 2013-06-19 DIAGNOSIS — H269 Unspecified cataract: Secondary | ICD-10-CM | POA: Diagnosis not present

## 2013-06-19 DIAGNOSIS — H2589 Other age-related cataract: Secondary | ICD-10-CM | POA: Diagnosis not present

## 2013-06-19 DIAGNOSIS — H2510 Age-related nuclear cataract, unspecified eye: Secondary | ICD-10-CM | POA: Diagnosis not present

## 2013-07-04 DIAGNOSIS — R269 Unspecified abnormalities of gait and mobility: Secondary | ICD-10-CM | POA: Diagnosis not present

## 2013-07-04 DIAGNOSIS — M6281 Muscle weakness (generalized): Secondary | ICD-10-CM | POA: Diagnosis not present

## 2013-07-06 DIAGNOSIS — R269 Unspecified abnormalities of gait and mobility: Secondary | ICD-10-CM | POA: Diagnosis not present

## 2013-07-06 DIAGNOSIS — M6281 Muscle weakness (generalized): Secondary | ICD-10-CM | POA: Diagnosis not present

## 2013-07-18 ENCOUNTER — Encounter: Payer: Self-pay | Admitting: Sports Medicine

## 2013-07-18 ENCOUNTER — Ambulatory Visit (INDEPENDENT_AMBULATORY_CARE_PROVIDER_SITE_OTHER): Payer: Medicare Other | Admitting: Sports Medicine

## 2013-07-18 VITALS — BP 132/84 | Ht 61.0 in | Wt 184.0 lb

## 2013-07-18 DIAGNOSIS — M25569 Pain in unspecified knee: Secondary | ICD-10-CM

## 2013-07-18 DIAGNOSIS — M25562 Pain in left knee: Secondary | ICD-10-CM | POA: Insufficient documentation

## 2013-07-18 NOTE — Assessment & Plan Note (Signed)
While the knee replacement seems stable I think she has developed soft tissue tear that involves capsule and part of LCL on left knee Unstable on gait even while on walker when she tries to turn  Placed in hinged brace She could walk with more stability after this. Try this for 1 month Reck if not responding

## 2013-07-18 NOTE — Progress Notes (Signed)
   Subjective:    Patient ID: Makayla Thomas, female    DOB: 1926/03/01, 79 y.o.   MRN: [id number]  HPI  Makayla Thomas is a 79yo woman with PMH of total L replacement in 2011 who presents with L knee pain. She first noted "flushing" and "warmth in the medial portion of her left knee. She uses a walker at baseline. She noticed that her knee made a "popping" noise while walking to the bathroom at a restaurant in July this year. Since then she has had persistent pain above and below her knee with walking.  It seems to be worse getting up from seated position. Sometimes when she pivots and turns a certain way it would hurt more.  She has seen her orthopedic doctor Dr. Dorene Grebe [facility]) who had done imaging. He felt that there is no issue with the knee replacement.   For pain she takes tylenol 1g bid and it does help. She has also been in physical therapy and has been very costly for her. She was given a brace and wore it for 2 weeks but it didn't help. She stopped wearing the brace about 1 month ago. She lives independently and does her own house chores, and cleaning.     Review of Systems     Objective:   Physical Exam Filed Vitals:   07/18/13 1159  BP: 132/84  General: Pleasant 79yo woman, NAD MSK: Left knee with surgical scar. On palpation she does have increase warmth throughout left knee. Palpated baker's cyst behind left knee about 2cm. She does not have any patellar grind or anterior/posterior facet pain. On ROM she has limited left flexion to about 100 deg with significant pain. No instability on anterior/posterior drawer test. No instability with varus/valgus stress. On ambulation with her walker she does have left knee valgus with varus thrust. When she turns she has instability in her left knee.   Ultrasound of L knee: significant effusion, Edematous quadriceps tendon. Lateral collateral ligament irregularity and hypoechoic change throughout the capsule and soft tissue. L  lateral joint space with effusion noted     Assessment & Plan:  Makayla Thomas is a 79yo woman with PMH of total L replacement in 2011 who presents with L knee pain. L knee pain is likely due to edema from tear in knee capsule at lateral side. She has noted instability of her L knee on ambulation. - continue with physical therapy - recommend wearinghinged  knee brace with ambulation to improve stability. Brace provided in clinic today - pain control: can cont tylenol - return as needed

## 2013-07-20 DIAGNOSIS — Z96649 Presence of unspecified artificial hip joint: Secondary | ICD-10-CM | POA: Diagnosis not present

## 2013-07-20 DIAGNOSIS — M1710 Unilateral primary osteoarthritis, unspecified knee: Secondary | ICD-10-CM | POA: Diagnosis not present

## 2013-07-20 DIAGNOSIS — IMO0001 Reserved for inherently not codable concepts without codable children: Secondary | ICD-10-CM | POA: Diagnosis not present

## 2013-07-20 DIAGNOSIS — R262 Difficulty in walking, not elsewhere classified: Secondary | ICD-10-CM | POA: Diagnosis not present

## 2013-07-20 DIAGNOSIS — I10 Essential (primary) hypertension: Secondary | ICD-10-CM | POA: Diagnosis not present

## 2013-07-20 DIAGNOSIS — M25569 Pain in unspecified knee: Secondary | ICD-10-CM | POA: Diagnosis not present

## 2013-07-24 DIAGNOSIS — R262 Difficulty in walking, not elsewhere classified: Secondary | ICD-10-CM | POA: Diagnosis not present

## 2013-07-24 DIAGNOSIS — I10 Essential (primary) hypertension: Secondary | ICD-10-CM | POA: Diagnosis not present

## 2013-07-24 DIAGNOSIS — M25569 Pain in unspecified knee: Secondary | ICD-10-CM | POA: Diagnosis not present

## 2013-07-24 DIAGNOSIS — M1710 Unilateral primary osteoarthritis, unspecified knee: Secondary | ICD-10-CM | POA: Diagnosis not present

## 2013-07-24 DIAGNOSIS — Z96649 Presence of unspecified artificial hip joint: Secondary | ICD-10-CM | POA: Diagnosis not present

## 2013-07-24 DIAGNOSIS — IMO0001 Reserved for inherently not codable concepts without codable children: Secondary | ICD-10-CM | POA: Diagnosis not present

## 2013-07-27 DIAGNOSIS — Z96649 Presence of unspecified artificial hip joint: Secondary | ICD-10-CM | POA: Diagnosis not present

## 2013-07-27 DIAGNOSIS — M25569 Pain in unspecified knee: Secondary | ICD-10-CM | POA: Diagnosis not present

## 2013-07-27 DIAGNOSIS — IMO0001 Reserved for inherently not codable concepts without codable children: Secondary | ICD-10-CM | POA: Diagnosis not present

## 2013-07-27 DIAGNOSIS — R262 Difficulty in walking, not elsewhere classified: Secondary | ICD-10-CM | POA: Diagnosis not present

## 2013-07-27 DIAGNOSIS — I10 Essential (primary) hypertension: Secondary | ICD-10-CM | POA: Diagnosis not present

## 2013-07-27 DIAGNOSIS — M1710 Unilateral primary osteoarthritis, unspecified knee: Secondary | ICD-10-CM | POA: Diagnosis not present

## 2013-08-02 DIAGNOSIS — IMO0001 Reserved for inherently not codable concepts without codable children: Secondary | ICD-10-CM | POA: Diagnosis not present

## 2013-08-02 DIAGNOSIS — Z96649 Presence of unspecified artificial hip joint: Secondary | ICD-10-CM | POA: Diagnosis not present

## 2013-08-02 DIAGNOSIS — R262 Difficulty in walking, not elsewhere classified: Secondary | ICD-10-CM | POA: Diagnosis not present

## 2013-08-02 DIAGNOSIS — I10 Essential (primary) hypertension: Secondary | ICD-10-CM | POA: Diagnosis not present

## 2013-08-02 DIAGNOSIS — M25569 Pain in unspecified knee: Secondary | ICD-10-CM | POA: Diagnosis not present

## 2013-08-02 DIAGNOSIS — M1710 Unilateral primary osteoarthritis, unspecified knee: Secondary | ICD-10-CM | POA: Diagnosis not present

## 2013-08-04 DIAGNOSIS — R262 Difficulty in walking, not elsewhere classified: Secondary | ICD-10-CM | POA: Diagnosis not present

## 2013-08-04 DIAGNOSIS — M25569 Pain in unspecified knee: Secondary | ICD-10-CM | POA: Diagnosis not present

## 2013-08-04 DIAGNOSIS — Z96649 Presence of unspecified artificial hip joint: Secondary | ICD-10-CM | POA: Diagnosis not present

## 2013-08-04 DIAGNOSIS — IMO0001 Reserved for inherently not codable concepts without codable children: Secondary | ICD-10-CM | POA: Diagnosis not present

## 2013-08-04 DIAGNOSIS — I10 Essential (primary) hypertension: Secondary | ICD-10-CM | POA: Diagnosis not present

## 2013-08-04 DIAGNOSIS — M1710 Unilateral primary osteoarthritis, unspecified knee: Secondary | ICD-10-CM | POA: Diagnosis not present

## 2013-08-08 DIAGNOSIS — R262 Difficulty in walking, not elsewhere classified: Secondary | ICD-10-CM | POA: Diagnosis not present

## 2013-08-08 DIAGNOSIS — M25569 Pain in unspecified knee: Secondary | ICD-10-CM | POA: Diagnosis not present

## 2013-08-08 DIAGNOSIS — M1710 Unilateral primary osteoarthritis, unspecified knee: Secondary | ICD-10-CM | POA: Diagnosis not present

## 2013-08-08 DIAGNOSIS — Z96649 Presence of unspecified artificial hip joint: Secondary | ICD-10-CM | POA: Diagnosis not present

## 2013-08-08 DIAGNOSIS — IMO0001 Reserved for inherently not codable concepts without codable children: Secondary | ICD-10-CM | POA: Diagnosis not present

## 2013-08-08 DIAGNOSIS — I10 Essential (primary) hypertension: Secondary | ICD-10-CM | POA: Diagnosis not present

## 2013-08-11 DIAGNOSIS — I10 Essential (primary) hypertension: Secondary | ICD-10-CM | POA: Diagnosis not present

## 2013-08-11 DIAGNOSIS — Z96649 Presence of unspecified artificial hip joint: Secondary | ICD-10-CM | POA: Diagnosis not present

## 2013-08-11 DIAGNOSIS — IMO0001 Reserved for inherently not codable concepts without codable children: Secondary | ICD-10-CM | POA: Diagnosis not present

## 2013-08-11 DIAGNOSIS — R262 Difficulty in walking, not elsewhere classified: Secondary | ICD-10-CM | POA: Diagnosis not present

## 2013-08-11 DIAGNOSIS — M1710 Unilateral primary osteoarthritis, unspecified knee: Secondary | ICD-10-CM | POA: Diagnosis not present

## 2013-08-11 DIAGNOSIS — M25569 Pain in unspecified knee: Secondary | ICD-10-CM | POA: Diagnosis not present

## 2013-08-15 DIAGNOSIS — M1710 Unilateral primary osteoarthritis, unspecified knee: Secondary | ICD-10-CM | POA: Diagnosis not present

## 2013-08-15 DIAGNOSIS — R262 Difficulty in walking, not elsewhere classified: Secondary | ICD-10-CM | POA: Diagnosis not present

## 2013-08-15 DIAGNOSIS — Z96649 Presence of unspecified artificial hip joint: Secondary | ICD-10-CM | POA: Diagnosis not present

## 2013-08-15 DIAGNOSIS — M25569 Pain in unspecified knee: Secondary | ICD-10-CM | POA: Diagnosis not present

## 2013-08-15 DIAGNOSIS — I10 Essential (primary) hypertension: Secondary | ICD-10-CM | POA: Diagnosis not present

## 2013-08-15 DIAGNOSIS — IMO0001 Reserved for inherently not codable concepts without codable children: Secondary | ICD-10-CM | POA: Diagnosis not present

## 2013-08-18 DIAGNOSIS — IMO0001 Reserved for inherently not codable concepts without codable children: Secondary | ICD-10-CM | POA: Diagnosis not present

## 2013-08-18 DIAGNOSIS — M1710 Unilateral primary osteoarthritis, unspecified knee: Secondary | ICD-10-CM | POA: Diagnosis not present

## 2013-08-18 DIAGNOSIS — IMO0002 Reserved for concepts with insufficient information to code with codable children: Secondary | ICD-10-CM | POA: Diagnosis not present

## 2013-08-18 DIAGNOSIS — Z96649 Presence of unspecified artificial hip joint: Secondary | ICD-10-CM | POA: Diagnosis not present

## 2013-08-18 DIAGNOSIS — I10 Essential (primary) hypertension: Secondary | ICD-10-CM | POA: Diagnosis not present

## 2013-08-18 DIAGNOSIS — R262 Difficulty in walking, not elsewhere classified: Secondary | ICD-10-CM | POA: Diagnosis not present

## 2013-08-18 DIAGNOSIS — M25569 Pain in unspecified knee: Secondary | ICD-10-CM | POA: Diagnosis not present

## 2013-08-22 DIAGNOSIS — IMO0001 Reserved for inherently not codable concepts without codable children: Secondary | ICD-10-CM | POA: Diagnosis not present

## 2013-08-22 DIAGNOSIS — IMO0002 Reserved for concepts with insufficient information to code with codable children: Secondary | ICD-10-CM | POA: Diagnosis not present

## 2013-08-22 DIAGNOSIS — I10 Essential (primary) hypertension: Secondary | ICD-10-CM | POA: Diagnosis not present

## 2013-08-22 DIAGNOSIS — Z96649 Presence of unspecified artificial hip joint: Secondary | ICD-10-CM | POA: Diagnosis not present

## 2013-08-22 DIAGNOSIS — R262 Difficulty in walking, not elsewhere classified: Secondary | ICD-10-CM | POA: Diagnosis not present

## 2013-08-22 DIAGNOSIS — M25569 Pain in unspecified knee: Secondary | ICD-10-CM | POA: Diagnosis not present

## 2013-08-22 DIAGNOSIS — M1710 Unilateral primary osteoarthritis, unspecified knee: Secondary | ICD-10-CM | POA: Diagnosis not present

## 2013-08-24 DIAGNOSIS — Z96649 Presence of unspecified artificial hip joint: Secondary | ICD-10-CM | POA: Diagnosis not present

## 2013-08-24 DIAGNOSIS — IMO0002 Reserved for concepts with insufficient information to code with codable children: Secondary | ICD-10-CM | POA: Diagnosis not present

## 2013-08-24 DIAGNOSIS — R262 Difficulty in walking, not elsewhere classified: Secondary | ICD-10-CM | POA: Diagnosis not present

## 2013-08-24 DIAGNOSIS — M25569 Pain in unspecified knee: Secondary | ICD-10-CM | POA: Diagnosis not present

## 2013-08-24 DIAGNOSIS — I10 Essential (primary) hypertension: Secondary | ICD-10-CM | POA: Diagnosis not present

## 2013-08-24 DIAGNOSIS — M1710 Unilateral primary osteoarthritis, unspecified knee: Secondary | ICD-10-CM | POA: Diagnosis not present

## 2013-08-24 DIAGNOSIS — IMO0001 Reserved for inherently not codable concepts without codable children: Secondary | ICD-10-CM | POA: Diagnosis not present

## 2013-08-29 DIAGNOSIS — R262 Difficulty in walking, not elsewhere classified: Secondary | ICD-10-CM | POA: Diagnosis not present

## 2013-08-29 DIAGNOSIS — IMO0001 Reserved for inherently not codable concepts without codable children: Secondary | ICD-10-CM | POA: Diagnosis not present

## 2013-08-29 DIAGNOSIS — M25569 Pain in unspecified knee: Secondary | ICD-10-CM | POA: Diagnosis not present

## 2013-08-29 DIAGNOSIS — IMO0002 Reserved for concepts with insufficient information to code with codable children: Secondary | ICD-10-CM | POA: Diagnosis not present

## 2013-08-29 DIAGNOSIS — I10 Essential (primary) hypertension: Secondary | ICD-10-CM | POA: Diagnosis not present

## 2013-08-29 DIAGNOSIS — M1710 Unilateral primary osteoarthritis, unspecified knee: Secondary | ICD-10-CM | POA: Diagnosis not present

## 2013-08-29 DIAGNOSIS — Z96649 Presence of unspecified artificial hip joint: Secondary | ICD-10-CM | POA: Diagnosis not present

## 2013-08-31 DIAGNOSIS — I10 Essential (primary) hypertension: Secondary | ICD-10-CM | POA: Diagnosis not present

## 2013-08-31 DIAGNOSIS — IMO0002 Reserved for concepts with insufficient information to code with codable children: Secondary | ICD-10-CM | POA: Diagnosis not present

## 2013-08-31 DIAGNOSIS — R262 Difficulty in walking, not elsewhere classified: Secondary | ICD-10-CM | POA: Diagnosis not present

## 2013-08-31 DIAGNOSIS — M1710 Unilateral primary osteoarthritis, unspecified knee: Secondary | ICD-10-CM | POA: Diagnosis not present

## 2013-08-31 DIAGNOSIS — IMO0001 Reserved for inherently not codable concepts without codable children: Secondary | ICD-10-CM | POA: Diagnosis not present

## 2013-08-31 DIAGNOSIS — Z96649 Presence of unspecified artificial hip joint: Secondary | ICD-10-CM | POA: Diagnosis not present

## 2013-08-31 DIAGNOSIS — M25569 Pain in unspecified knee: Secondary | ICD-10-CM | POA: Diagnosis not present

## 2013-09-29 DIAGNOSIS — M25569 Pain in unspecified knee: Secondary | ICD-10-CM | POA: Diagnosis not present

## 2013-11-28 DIAGNOSIS — H35379 Puckering of macula, unspecified eye: Secondary | ICD-10-CM | POA: Diagnosis not present

## 2013-11-28 DIAGNOSIS — H26499 Other secondary cataract, unspecified eye: Secondary | ICD-10-CM | POA: Diagnosis not present

## 2014-01-03 ENCOUNTER — Observation Stay (HOSPITAL_COMMUNITY): Payer: Medicare Other

## 2014-01-03 ENCOUNTER — Encounter (HOSPITAL_COMMUNITY): Payer: Self-pay | Admitting: *Deleted

## 2014-01-03 ENCOUNTER — Observation Stay (HOSPITAL_COMMUNITY)
Admission: AD | Admit: 2014-01-03 | Discharge: 2014-01-06 | Disposition: A | Discharge status: Skilled Nursing Facility | Payer: Medicare Other | Source: Ambulatory Visit | Attending: Internal Medicine | Admitting: Internal Medicine

## 2014-01-03 DIAGNOSIS — M25519 Pain in unspecified shoulder: Secondary | ICD-10-CM | POA: Insufficient documentation

## 2014-01-03 DIAGNOSIS — E785 Hyperlipidemia, unspecified: Secondary | ICD-10-CM | POA: Insufficient documentation

## 2014-01-03 DIAGNOSIS — S82009A Unspecified fracture of unspecified patella, initial encounter for closed fracture: Secondary | ICD-10-CM | POA: Diagnosis not present

## 2014-01-03 DIAGNOSIS — G35 Multiple sclerosis: Secondary | ICD-10-CM | POA: Diagnosis not present

## 2014-01-03 DIAGNOSIS — I4891 Unspecified atrial fibrillation: Secondary | ICD-10-CM | POA: Diagnosis not present

## 2014-01-03 DIAGNOSIS — I739 Peripheral vascular disease, unspecified: Secondary | ICD-10-CM | POA: Insufficient documentation

## 2014-01-03 DIAGNOSIS — I359 Nonrheumatic aortic valve disorder, unspecified: Secondary | ICD-10-CM | POA: Diagnosis not present

## 2014-01-03 DIAGNOSIS — R262 Difficulty in walking, not elsewhere classified: Secondary | ICD-10-CM | POA: Diagnosis not present

## 2014-01-03 DIAGNOSIS — Z79899 Other long term (current) drug therapy: Secondary | ICD-10-CM | POA: Diagnosis not present

## 2014-01-03 DIAGNOSIS — Z96659 Presence of unspecified artificial knee joint: Secondary | ICD-10-CM | POA: Diagnosis not present

## 2014-01-03 DIAGNOSIS — T84049A Periprosthetic fracture around unspecified internal prosthetic joint, initial encounter: Principal | ICD-10-CM | POA: Insufficient documentation

## 2014-01-03 DIAGNOSIS — Z9660 Presence of unspecified orthopedic joint implant: Secondary | ICD-10-CM | POA: Diagnosis not present

## 2014-01-03 DIAGNOSIS — M25569 Pain in unspecified knee: Secondary | ICD-10-CM | POA: Diagnosis present

## 2014-01-03 DIAGNOSIS — I4821 Permanent atrial fibrillation: Secondary | ICD-10-CM | POA: Diagnosis present

## 2014-01-03 DIAGNOSIS — Z7901 Long term (current) use of anticoagulants: Secondary | ICD-10-CM

## 2014-01-03 DIAGNOSIS — X58XXXA Exposure to other specified factors, initial encounter: Secondary | ICD-10-CM | POA: Diagnosis not present

## 2014-01-03 DIAGNOSIS — E039 Hypothyroidism, unspecified: Secondary | ICD-10-CM | POA: Insufficient documentation

## 2014-01-03 DIAGNOSIS — M25562 Pain in left knee: Secondary | ICD-10-CM | POA: Diagnosis present

## 2014-01-03 DIAGNOSIS — I10 Essential (primary) hypertension: Secondary | ICD-10-CM | POA: Diagnosis present

## 2014-01-03 DIAGNOSIS — IMO0002 Reserved for concepts with insufficient information to code with codable children: Secondary | ICD-10-CM | POA: Diagnosis not present

## 2014-01-03 DIAGNOSIS — Z9089 Acquired absence of other organs: Secondary | ICD-10-CM | POA: Insufficient documentation

## 2014-01-03 DIAGNOSIS — Z8673 Personal history of transient ischemic attack (TIA), and cerebral infarction without residual deficits: Secondary | ICD-10-CM | POA: Diagnosis not present

## 2014-01-03 DIAGNOSIS — I350 Nonrheumatic aortic (valve) stenosis: Secondary | ICD-10-CM | POA: Diagnosis present

## 2014-01-03 LAB — BASIC METABOLIC PANEL
BUN: 24 mg/dL — ABNORMAL HIGH (ref 6–23)
CALCIUM: 9.9 mg/dL (ref 8.4–10.5)
CO2: 30 mEq/L (ref 19–32)
CREATININE: 0.84 mg/dL (ref 0.50–1.10)
Chloride: 97 mEq/L (ref 96–112)
GFR calc non Af Amer: 61 mL/min — ABNORMAL LOW (ref >90)
GFR, EST AFRICAN AMERICAN: 71 mL/min — AB (ref >90)
Glucose, Bld: 107 mg/dL — ABNORMAL HIGH (ref 70–99)
Potassium: 3.4 mEq/L — ABNORMAL LOW (ref 3.7–5.3)
Sodium: 142 mEq/L (ref 137–147)

## 2014-01-03 LAB — CBC
HCT: 40 % (ref 36.0–46.0)
Hemoglobin: 13.3 g/dL (ref 12.0–15.0)
MCH: 30 pg (ref 26.0–34.0)
MCHC: 33.3 g/dL (ref 30.0–36.0)
MCV: 90.3 fL (ref 78.0–100.0)
PLATELETS: 279 10*3/uL (ref 150–400)
RBC: 4.43 MIL/uL (ref 3.87–5.11)
RDW: 14.1 % (ref 11.5–15.5)
WBC: 10 10*3/uL (ref 4.0–10.5)

## 2014-01-03 MED ORDER — ONDANSETRON HCL 4 MG/2ML IJ SOLN: 4.0000 mg | Freq: Four times a day (QID) | INTRAMUSCULAR | Status: DC | PRN

## 2014-01-03 MED ORDER — RIVAROXABAN 15 MG PO TABS
15.0000 mg | ORAL_TABLET | Freq: Every day | ORAL | Status: DC
  Administered 2014-01-03 – 2014-01-06 (×4): 15 mg via ORAL
  Filled 2014-01-03 (×4): qty 1

## 2014-01-03 MED ORDER — HYDROCODONE-ACETAMINOPHEN 5-325 MG PO TABS
1.0000 | ORAL_TABLET | Freq: Four times a day (QID) | ORAL | Status: DC | PRN
  Administered 2014-01-04: 1 via ORAL
  Filled 2014-01-03: qty 1

## 2014-01-03 MED ORDER — DICLOFENAC SODIUM 1 % TD GEL
4.0000 g | Freq: Two times a day (BID) | TRANSDERMAL | Status: DC | PRN
  Filled 2014-01-03: qty 100

## 2014-01-03 MED ORDER — ACETAMINOPHEN 500 MG PO TABS: 500.0000 mg | ORAL_TABLET | Freq: Four times a day (QID) | ORAL | Status: DC | PRN

## 2014-01-03 MED ORDER — DILTIAZEM HCL ER COATED BEADS 120 MG PO CP24
120.0000 mg | ORAL_CAPSULE | Freq: Every day | ORAL | Status: DC
  Administered 2014-01-03 – 2014-01-05 (×3): 120 mg via ORAL
  Filled 2014-01-03 (×4): qty 1

## 2014-01-03 MED ORDER — VITAMIN D3 25 MCG (1000 UNIT) PO TABS
1000.0000 [IU] | ORAL_TABLET | Freq: Every day | ORAL | Status: DC
  Administered 2014-01-04 – 2014-01-06 (×3): 1000 [IU] via ORAL
  Filled 2014-01-03 (×4): qty 1

## 2014-01-03 MED ORDER — BISOPROLOL-HYDROCHLOROTHIAZIDE 10-6.25 MG PO TABS
1.0000 | ORAL_TABLET | Freq: Every day | ORAL | Status: DC
  Administered 2014-01-03 – 2014-01-06 (×4): 1 via ORAL
  Filled 2014-01-03 (×4): qty 1

## 2014-01-03 MED ORDER — LEVOTHYROXINE SODIUM 125 MCG PO TABS
125.0000 ug | ORAL_TABLET | Freq: Every day | ORAL | Status: DC
  Administered 2014-01-04 – 2014-01-06 (×3): 125 ug via ORAL
  Filled 2014-01-03 (×4): qty 1

## 2014-01-03 NOTE — Progress Notes (Signed)
Patient admitted to 5E, alert and oriented, transferred by wheel chair, tolerated well, patient own wheel chair at bed side, family at bed side, patient was an direct admitted without any report given to staff, unaware of patient coming to unit, secretary stated,"my computer went out so I did not know that this patient was coming to unit," VS taken, patient in stable condition at this time, will continue to monitor patient

## 2014-01-03 NOTE — Progress Notes (Signed)
Pt with patella fx per call from radiologist. Paged midlevel to inform awaiting call back.

## 2014-01-03 NOTE — H&P (Signed)
[facility] History and Physical  Makayla Thomas ZOX:[id number] DOB: [birth year] DOA: 01/03/2014  Referring physician: Dr.Pharr PCP: Londell Moh, MD   Chief Complaint: Direct Admission from Dr.Pharr's office  HPI: Makayla Thomas is a 80 y.o. female with PMH of HTN, Chronic afib on Xarelto, H/o L knee replacement 2 years ago, in February was found to have L patella fracture detected by Dr. Charlann Boxer, treated non operatively, she has noticed, intermittent redness and slight swelling of her L knee off and on for months. She lives alone and id independent, On Sunday she noticed stiffness and mild pain/discomfort of L knee with movement, her son came home to help her and noticed a small 2x3cm area of redness overlying the patella, she was somehow able to get back in bed that night, Monday and Tuesday she felt better and was able to ambulate close to baseline albeit with intermittent discomfort. This am, she noticed the redness was more diffuse and had more difficulty moving her L knee, was unable to stand, bear weight. Denies any trauma, fall, no fevers no chills. Went to see dr.Olin who did Xrays which revealed recent patella Fx, without anything acute, and sent her back to her PCP, who referred her to [facility] as a direct admission for further evaluation.    Review of Systems:  Constitutional:  No weight loss, night sweats, Fevers, chills, fatigue.  HEENT:  No headaches, Difficulty swallowing,Tooth/dental problems,Sore throat,  No sneezing, itching, ear ache, nasal congestion, post nasal drip,  Cardio-vascular:  No chest pain, Orthopnea, PND, swelling in lower extremities, anasarca, dizziness, palpitations  GI:  No heartburn, indigestion, abdominal pain, nausea, vomiting, diarrhea, change in bowel habits, loss of appetite  Resp:  No shortness of breath with exertion or at rest. No excess mucus, no productive cough, No non-productive cough, No coughing up of blood.No change in  color of mucus.No wheezing.No chest wall deformity  Skin:  no rash or lesions.  GU:  no dysuria, change in color of urine, no urgency or frequency. No flank pain.  Musculoskeletal:  No joint pain or swelling. No decreased range of motion. No back pain.  Psych:  No change in mood or affect. No depression or anxiety. No memory loss.   Past Medical History  Diagnosis Date  . Permanent atrial fibrillation   . Chronic anticoagulation     on Xarelto  . H/O: CVA (cerebrovascular accident)   . Hypothyroidism   . Dyslipidemia     treated  . PAD (peripheral artery disease) 08/30/2009    Dopplers; with bil. post. tib artery occlusion  . Lower extremity edema     chronic  . Aortic stenosis, mild 08/2009    ECHO  EF 55-60%, wth increased EDP, mild   . H/O cardiovascular stress test 05/2010    Lexiscan cardiolite no ischemia or infarction  . History of ETT 02/2011    Naughton exercise protocol, negative with poor exercise tolerance  . H/O multiple sclerosis     no excaerbations in some time   Past Surgical History  Procedure Laterality Date  . Cesarean section      hx of 3  . Throidectomy partial      lt  . Cholecystectomy    . Breast surgery      L tumor removed - benign   Social History:  reports that she has never smoked. She has never used smokeless tobacco. She reports that she drinks alcohol. She reports that she does not use illicit drugs.  Allergies  Allergen Reactions  . Tetanus Antitoxin Anaphylaxis    Throat swelling  . Meclizine Other (See Comments)    Had funny feeling    Family History  Problem Relation Age of Onset  . Heart failure Father 103     Prior to Admission medications   Medication Sig Start Date End Date Taking? Authorizing Provider  acetaminophen (TYLENOL) 500 MG tablet Take 500 mg by mouth every 6 (six) hours as needed for pain.   Yes Historical Provider, MD  alendronate (FOSAMAX) 70 MG tablet Take 70 mg by mouth once a week.  04/04/12  Yes  Historical Provider, MD  bisoprolol-hydrochlorothiazide (ZIAC) 10-6.25 MG per tablet Take 1 tablet by mouth daily.   Yes Historical Provider, MD  cholecalciferol (VITAMIN D) 1000 UNITS tablet Take 1,000 Units by mouth daily.   Yes Historical Provider, MD  diclofenac sodium (VOLTAREN) 1 % GEL Apply 3 g topically 2 (two) times daily as needed (knee pain).   Yes Historical Provider, MD  diltiazem (CARDIZEM CD) 120 MG 24 hr capsule Take 120 mg by mouth at bedtime.   Yes Historical Provider, MD  furosemide (LASIX) 20 MG tablet Take 20 mg by mouth 2 (two) times daily.  06/28/12  Yes Historical Provider, MD  Pitavastatin Calcium (LIVALO) 1 MG TABS Take 1 mg by mouth daily.   Yes Historical Provider, MD  SYNTHROID 125 MCG tablet Take 125 mcg by mouth daily. 04/13/12  Yes Historical Provider, MD  XARELTO 15 MG TABS tablet Take 15 mg by mouth daily.  05/25/12  Yes Historical Provider, MD   Physical Exam: Filed Vitals:   01/03/14 1805  BP: 121/74  Pulse: 65  Temp: 97.8 F (36.6 C)  Resp: 16    BP 121/74  Pulse 65  Temp(Src) 97.8 F (36.6 C) (Oral)  Resp 16  Ht 5\' 1"  (1.549 m)  SpO2 96%  General:  Appears calm and comfortable Eyes: PERRL, normal lids, irises & conjunctiva ENT: grossly normal hearing, lips & tongue Neck: no LAD, masses or thyromegaly Cardiovascular: RRR, no m/r/g. No LE edema. Telemetry: SR, no arrhythmias  Respiratory: CTA bilaterally, no w/r/r. Normal respiratory effort. Abdomen: soft, ntnd Skin: no rash or induration seen on limited exam Musculoskeletal: L knee with brusing/mild erythema overlying the patella, do not appreciate joint effusion, slight warmth, limited RoM esp flexion of knee Psychiatric: grossly normal mood and affect, speech fluent and appropriate Neurologic: grossly non-focal.          Labs on Admission:  Basic Metabolic Panel: No results found for this basename: NA, K, CL, CO2, GLUCOSE, BUN, CREATININE, CALCIUM, MG, PHOS,  in the last 168  hours Liver Function Tests: No results found for this basename: AST, ALT, ALKPHOS, BILITOT, PROT, ALBUMIN,  in the last 168 hours No results found for this basename: LIPASE, AMYLASE,  in the last 168 hours No results found for this basename: AMMONIA,  in the last 168 hours CBC: No results found for this basename: WBC, NEUTROABS, HGB, HCT, MCV, PLT,  in the last 168 hours Cardiac Enzymes: No results found for this basename: CKTOTAL, CKMB, CKMBINDEX, TROPONINI,  in the last 168 hours  BNP (last 3 results) No results found for this basename: PROBNP,  in the last 8760 hours CBG: No results found for this basename: GLUCAP,  in the last 168 hours  Radiological Exams on Admission: No results found.   Assessment/Plan Principal Problem:   Left knee pain -suspect related to patella fracture -? Peripatellar hematoma -unable to have  MRI due to metal in joint -d/w MSK Radiologist on call, ok to proceed with CT which may pick up hematoma, although could have significant artifact -Pt eval - may need Arthroscopic evaluation if worsens - I do not suspect infection at this time  Chronic afib -rate controlled, continue diltiazem and xarelto    Essential hypertension  -stable, continue home regimen  ALL LABS PENDING  DVT proph: on anticoagulation  Code Status: Full COde Family Communication: d//w son at bedside Disposition Plan: home pending workup, Pt eval  Time spent: 50min  Zannie Covereetha Celeste Candelas [facility] Pager [phone]  **Disclaimer: This note may have been dictated with voice recognition software. Similar sounding words can inadvertently be transcribed and this note may contain transcription errors which may not have been corrected upon publication of note.**

## 2014-01-04 ENCOUNTER — Ambulatory Visit: Payer: Medicare Other | Admitting: Cardiology

## 2014-01-04 DIAGNOSIS — I10 Essential (primary) hypertension: Secondary | ICD-10-CM | POA: Diagnosis not present

## 2014-01-04 DIAGNOSIS — I4891 Unspecified atrial fibrillation: Secondary | ICD-10-CM | POA: Diagnosis not present

## 2014-01-04 DIAGNOSIS — Z9660 Presence of unspecified orthopedic joint implant: Secondary | ICD-10-CM | POA: Diagnosis not present

## 2014-01-04 DIAGNOSIS — M25519 Pain in unspecified shoulder: Secondary | ICD-10-CM | POA: Diagnosis not present

## 2014-01-04 DIAGNOSIS — I359 Nonrheumatic aortic valve disorder, unspecified: Secondary | ICD-10-CM | POA: Diagnosis not present

## 2014-01-04 DIAGNOSIS — Z7901 Long term (current) use of anticoagulants: Secondary | ICD-10-CM | POA: Diagnosis not present

## 2014-01-04 DIAGNOSIS — M25569 Pain in unspecified knee: Secondary | ICD-10-CM | POA: Diagnosis not present

## 2014-01-04 DIAGNOSIS — T84049A Periprosthetic fracture around unspecified internal prosthetic joint, initial encounter: Secondary | ICD-10-CM | POA: Diagnosis not present

## 2014-01-04 LAB — BASIC METABOLIC PANEL
BUN: 22 mg/dL (ref 6–23)
CO2: 31 mEq/L (ref 19–32)
CREATININE: 0.97 mg/dL (ref 0.50–1.10)
Calcium: 9.6 mg/dL (ref 8.4–10.5)
Chloride: 98 mEq/L (ref 96–112)
GFR calc Af Amer: 60 mL/min — ABNORMAL LOW (ref >90)
GFR, EST NON AFRICAN AMERICAN: 51 mL/min — AB (ref >90)
Glucose, Bld: 147 mg/dL — ABNORMAL HIGH (ref 70–99)
Potassium: 3.6 mEq/L — ABNORMAL LOW (ref 3.7–5.3)
Sodium: 141 mEq/L (ref 137–147)

## 2014-01-04 LAB — CBC
HEMATOCRIT: 37.9 % (ref 36.0–46.0)
Hemoglobin: 12.8 g/dL (ref 12.0–15.0)
MCH: 30.7 pg (ref 26.0–34.0)
MCHC: 33.8 g/dL (ref 30.0–36.0)
MCV: 90.9 fL (ref 78.0–100.0)
Platelets: 234 10*3/uL (ref 150–400)
RBC: 4.17 MIL/uL (ref 3.87–5.11)
RDW: 14.1 % (ref 11.5–15.5)
WBC: 9.7 10*3/uL (ref 4.0–10.5)

## 2014-01-04 LAB — GLUCOSE, CAPILLARY: Glucose-Capillary: 124 mg/dL — ABNORMAL HIGH (ref 70–99)

## 2014-01-04 MED ORDER — POLYETHYLENE GLYCOL 3350 17 G PO PACK
17.0000 g | PACK | Freq: Every day | ORAL | Status: DC
  Administered 2014-01-04 – 2014-01-06 (×3): 17 g via ORAL
  Filled 2014-01-04 (×3): qty 1

## 2014-01-04 MED ORDER — DIPHENHYDRAMINE HCL 12.5 MG/5ML PO ELIX: 12.5000 mg | ORAL_SOLUTION | Freq: Once | ORAL | Status: DC

## 2014-01-04 NOTE — Consult Note (Signed)
Reason for Consult: Left Knee Pain  Referring Physician: Dr. Domenic Polite  Makayla Thomas is an 80 y.o. female.  HPI: Makayla Thomas is an 80 year old female known Dr. Adriana Mccallum being seen and followed for a left knee periprosthetic patella fracture. She originally had her Left Total Knee Replacement performed by Dr. Alphonzo Severance in the fall of 2012.  She had a fall last year but did not have any specific injury at that time.  She may or may not have injured her knee with the walker during that fall. Later, she developed some knee pain in June 2014 that was causing some issues with the left leg.  She does have MS and stated that she was having difficulty getting around.  Her medical doctor sent her to outpatient physical therapy which did not help much.  She later did therapy in house at [facility].  She continued to have problems and was recommended to get a second opinion concerning her knee.  She was evaluated by Dr. Alvan Dame in July 2014 and found to have a "crack" in her patella.  The x-rays in the office showed a comminuted patella fracture.  She had active knee extension and that surgery would likely on succeed in reducing the strength of her quad extensor mechanism.and possibly not improve her overall function.  It was recommended to treat this conservatively. She had a change in her condition recently.  She was ill on Sunday and noticed stiffness and mild pain with the left knee. Her son noticed some redness at that time.  She did okay on Monday and Tuesday rested with ice and voltaren gel for the knee.  When she woke up on Wednesday morning she was better but after sitting at her computer table for about an half an hour, she tried to get up but was unable to stand. She contacted the office and was seen by Dr. Alvan Dame yesterday, 01/04/2024, and underwent x-rays.  She was noted to still have the comminuted patella fracture and recommended to follow up in four weeks. She was noted to have the  discoloration on the top of her knee but it was felt to be dur to blood from the fracture site. She then went to her PCP who was concerned of cellulitis and infection and was admitted to the hospital for antibiotic therapy.  Dr. Wynelle Link was notified of the admission and asked to evaluate the knee to see if there is any indication for surgery or aspiration.   Past Medical History  Diagnosis Date  . Permanent atrial fibrillation   . Chronic anticoagulation     on Xarelto  . H/O: CVA (cerebrovascular accident)   . Hypothyroidism   . Dyslipidemia     treated  . PAD (peripheral artery disease) 08/30/2009    Dopplers; with bil. post. tib artery occlusion  . Lower extremity edema     chronic  . Aortic stenosis, mild 08/2009    ECHO  EF 55-60%, wth increased EDP, mild   . H/O cardiovascular stress test 05/2010    Lexiscan cardiolite no ischemia or infarction  . History of ETT 02/2011    Naughton exercise protocol, negative with poor exercise tolerance  . H/O multiple sclerosis     no excaerbations in some time    Past Surgical History  Procedure Laterality Date  . Cesarean section      hx of 3  . Throidectomy partial      lt  . Cholecystectomy    .  Breast surgery      L tumor removed - benign    Family History  Problem Relation Age of Onset  . Heart failure Father 71    Social History:  reports that she has never smoked. She has never used smokeless tobacco. She reports that she drinks alcohol. She reports that she does not use illicit drugs.  Allergies:  Allergies  Allergen Reactions  . Tetanus Antitoxin Anaphylaxis    Throat swelling  . Meclizine Other (See Comments)    Had funny feeling    Medications: Tylenol Fosamax Ziac Vitamin D Voltaren Gel Cardizem CD Lasix Livalo Synthroid Xarelto  Results for orders placed during the hospital encounter of 01/03/14 (from the past 48 hour(s))  BASIC METABOLIC PANEL     Status: Abnormal   Collection Time    01/03/14   7:33 PM      Result Value Ref Range   Sodium 142  137 - 147 mEq/L   Potassium 3.4 (*) 3.7 - 5.3 mEq/L   Chloride 97  96 - 112 mEq/L   CO2 30  19 - 32 mEq/L   Glucose, Bld 107 (*) 70 - 99 mg/dL   BUN 24 (*) 6 - 23 mg/dL   Creatinine, Ser 0.84  0.50 - 1.10 mg/dL   Calcium 9.9  8.4 - 10.5 mg/dL   GFR calc non Af Amer 61 (*) >90 mL/min   GFR calc Af Amer 71 (*) >90 mL/min   Comment: (NOTE)     The eGFR has been calculated using the CKD EPI equation.     This calculation has not been validated in all clinical situations.     eGFR's persistently <90 mL/min signify possible Chronic Kidney     Disease.  CBC     Status: None   Collection Time    01/03/14  7:33 PM      Result Value Ref Range   WBC 10.0  4.0 - 10.5 K/uL   RBC 4.43  3.87 - 5.11 MIL/uL   Hemoglobin 13.3  12.0 - 15.0 g/dL   HCT 40.0  36.0 - 46.0 %   MCV 90.3  78.0 - 100.0 fL   MCH 30.0  26.0 - 34.0 pg   MCHC 33.3  30.0 - 36.0 g/dL   RDW 14.1  11.5 - 15.5 %   Platelets 279  150 - 400 K/uL  GLUCOSE, CAPILLARY     Status: Abnormal   Collection Time    01/04/14  3:12 AM      Result Value Ref Range   Glucose-Capillary 124 (*) 70 - 99 mg/dL  CBC     Status: None   Collection Time    01/04/14  5:14 AM      Result Value Ref Range   WBC 9.7  4.0 - 10.5 K/uL   RBC 4.17  3.87 - 5.11 MIL/uL   Hemoglobin 12.8  12.0 - 15.0 g/dL   HCT 37.9  36.0 - 46.0 %   MCV 90.9  78.0 - 100.0 fL   MCH 30.7  26.0 - 34.0 pg   MCHC 33.8  30.0 - 36.0 g/dL   RDW 14.1  11.5 - 15.5 %   Platelets 234  150 - 400 K/uL  BASIC METABOLIC PANEL     Status: Abnormal   Collection Time    01/04/14  5:14 AM      Result Value Ref Range   Sodium 141  137 - 147 mEq/L   Potassium 3.6 (*) 3.7 -  5.3 mEq/L   Chloride 98  96 - 112 mEq/L   CO2 31  19 - 32 mEq/L   Glucose, Bld 147 (*) 70 - 99 mg/dL   BUN 22  6 - 23 mg/dL   Creatinine, Ser 0.97  0.50 - 1.10 mg/dL   Calcium 9.6  8.4 - 10.5 mg/dL   GFR calc non Af Amer 51 (*) >90 mL/min   GFR calc Af Amer 60 (*)  >90 mL/min   Comment: (NOTE)     The eGFR has been calculated using the CKD EPI equation.     This calculation has not been validated in all clinical situations.     eGFR's persistently <90 mL/min signify possible Chronic Kidney     Disease.    Ct Knee Left Wo Contrast  01/04/2014   CLINICAL DATA:  Pain and swelling.  EXAM: CT OF THE LEFT KNEE WITHOUT CONTRAST  TECHNIQUE: Multidetector CT imaging of the left knee was performed according to the standard protocol. Multiplanar CT image reconstructions were also generated.  COMPARISON:  Radiographs dated 06/10/2010  FINDINGS: There is a comminuted fracture of the patella with approximately 12 mm of distraction between the most proximal and distal fragments. There is a prominent joint effusion. The femoral and tibial components of the prosthesis are in good position without evidence of loosening. Detail is degraded by metallic artifact. There are no appreciable fractures of the distal femur or proximal tibia or proximal fibula.  IMPRESSION: Comminuted fracture of the patella.  Critical Value/emergent results were called by telephone by Dr. Margaree Mackintosh at the time of the preliminary interpretation on 01/03/2014 at 10:35 p.m. to Dr. Harley Hallmark, RN, who verbally acknowledged these results.   Electronically Signed   By: Rozetta Nunnery M.D.   On: 01/04/2014 07:12    Review of Systems  Constitutional: Negative.   Respiratory: Negative.   Cardiovascular: Negative.   Genitourinary: Negative.   Musculoskeletal: Positive for joint pain (Left knee pain and stiffness).    Blood pressure 114/70, pulse 96, temperature 98.7 F (37.1 C), temperature source Oral, resp. rate 18, height _0  (1.549 m), weight 85.5 kg (188 lb 7.9 oz), SpO2 92.00%. Physical Exam  Musculoskeletal:       Left knee: She exhibits effusion and erythema (mild erythema in a well-defined circular pattern over the anterior knee). Tenderness found. MCL and LCL (mild) tenderness noted.        Legs:    Assessment/Plan: Left Comminuted Patella Fracture Probable soft tissue bleeding from fracture due to chronic Xarelto treatment.  Dr. Wynelle Link will evaluate the patient later today to see if there is any surgical indication or need for aspiration of the knee/joint to rule out any other pathology.  Makayla Thomas 01/04/2014, 3:57 PM   I have seen and examined the patient and agree with the above findings. Her knee has mild swelling which is extraarticular and there is no sign of abscess or superficial infection. She has bruising/hemosiderin staining of the skin probably from a mild bleed from the anticoagulation. She can be up ambulating with physical therapy. No need for intervention surgically. I will notify Dr. Alvan Dame that the patient is in the hospital.

## 2014-01-04 NOTE — Progress Notes (Signed)
Pt slightly diaphoretic cbg 127.

## 2014-01-04 NOTE — Progress Notes (Signed)
PROGRESS NOTE  Makayla Thomas OVP:[id number] DOB: 1925/10/13 DOA: 01/03/2014 PCP: Londell Moh, MD  HPI: Makayla Thomas is a 80 y.o. female with PMH of HTN, Chronic afib on Xarelto, H/o L knee replacement 2 years ago, in February was found to have L patella fracture detected by Dr. Charlann Boxer, treated non operatively, she has noticed, intermittent redness and slight swelling of her L knee off and on for months.  Assessment/Plan:  Left patellar comminuted fracture - consulted orthopedic surgery, appreciate input.  - patient now with severe functional limitation and unable to walk, progressively worsening in the past few months despite conservative treatment.  - PT to evaluate once ortho sees her  Chronic a fib - rate controlled, on A/C  HTN - continue home regimen.   Diet: regular Fluids: none  DVT Prophylaxis: Xarelto  Code Status: Full Family Communication: d/w patient  Disposition Plan: inpatient  Consultants:  Orthopedic surgery   Procedures:  None    Antibiotics - none  HPI/Subjective: - feeling well, no knee pain at rest  Objective: Filed Vitals:   01/03/14 1805 01/04/14 0442 01/04/14 0616  BP: 121/74 106/60 114/71  Pulse: 65 64 67  Temp: 97.8 F (36.6 C) 98.9 F (37.2 C) 98.4 F (36.9 C)  TempSrc: Oral Oral Oral  Resp: 16 18 16   Height: 5\' 1"  (1.549 m)    Weight: 85.5 kg (188 lb 7.9 oz)    SpO2: 96% 94% 93%    Intake/Output Summary (Last 24 hours) at 01/04/14 1859 Last data filed at 01/04/14 0400  Gross per 24 hour  Intake    360 ml  Output      0 ml  Net    360 ml   Filed Weights   01/03/14 1805  Weight: 85.5 kg (188 lb 7.9 oz)    Exam:   General:  NAD  Cardiovascular: regular rate and rhythm, without MRG  Respiratory: good air movement, clear to auscultation throughout, no wheezing, ronchi or rales  Abdomen: soft, not tender to palpation, positive bowel sounds  MSK: no peripheral edema, L knee with joint effusion, anterior  bruising, well demarcated.   Neuro: CN 2-12 grossly intact, MS 5/5 in all 4  Data Reviewed: Basic Metabolic Panel:  Recent Labs Lab 01/03/14 1933 01/04/14 0514  NA 142 141  K 3.4* 3.6*  CL 97 98  CO2 30 31  GLUCOSE 107* 147*  BUN 24* 22  CREATININE 0.84 0.97  CALCIUM 9.9 9.6   CBC:  Recent Labs Lab 01/03/14 1933 01/04/14 0514  WBC 10.0 9.7  HGB 13.3 12.8  HCT 40.0 37.9  MCV 90.3 90.9  PLT 279 234   CBG:  Recent Labs Lab 01/04/14 0312  GLUCAP 124*   Studies: Ct Knee Left Wo Contrast  01/04/2014   CLINICAL DATA:  Pain and swelling.  EXAM: CT OF THE LEFT KNEE WITHOUT CONTRAST  TECHNIQUE: Multidetector CT imaging of the left knee was performed according to the standard protocol. Multiplanar CT image reconstructions were also generated.  COMPARISON:  Radiographs dated 06/10/2010  FINDINGS: There is a comminuted fracture of the patella with approximately 12 mm of distraction between the most proximal and distal fragments. There is a prominent joint effusion. The femoral and tibial components of the prosthesis are in good position without evidence of loosening. Detail is degraded by metallic artifact. There are no appreciable fractures of the distal femur or proximal tibia or proximal fibula.  IMPRESSION: Comminuted fracture of the patella.  Critical Value/emergent results were  called by telephone by Dr. Salome HolmesHector Cooper at the time of the preliminary interpretation on 01/03/2014 at 10:35 p.m. to Dr. Jeanella Antonrystal Mitchell, RN, who verbally acknowledged these results.   Electronically Signed   By: Geanie CooleyJim  Maxwell M.D.   On: 01/04/2014 07:12    Scheduled Meds: . bisoprolol-hydrochlorothiazide  1 tablet Oral Daily  . cholecalciferol  1,000 Units Oral Daily  . diltiazem  120 mg Oral QHS  . diphenhydrAMINE  12.5 mg Oral Once  . levothyroxine  125 mcg Oral QAC breakfast  . Rivaroxaban  15 mg Oral Daily   Continuous Infusions:   Principal Problem:   Left knee pain Active Problems:    Aortic stenosis, mild   Permanent atrial fibrillation   Chronic anticoagulation   Essential hypertension -well controlled   Time spent: 25  This note has been created with Education officer, environmentalDragon speech recognition software and smart phrase technology. Any transcriptional errors are unintentional.   Pamella Pertostin Narayan Scull, MD [facility] Pager [phone]. If 7 PM - 7 AM, please contact night-coverage at [email or web address], password [email or web address] 01/04/2014, 8:07 AM  LOS: 1 day

## 2014-01-04 NOTE — Progress Notes (Signed)
Pt c/o abd pain and being hot. Vitals obtained and midlevel paged.

## 2014-01-04 NOTE — Progress Notes (Signed)
Pt c/o insomnia.

## 2014-01-04 NOTE — Progress Notes (Signed)
PT Cancellation Note  Patient Details Name: Makayla Thomas MRN: [id number] DOB: 22-Apr-1926   Cancelled Treatment:    Reason Eval/Treat Not Completed: Other (comment) Pt with current bedrest order.  Imaging reports comminuted fracture of the patella.  Please update activity level and advise on any restrictions (brace, WB status, ROM, etc).  Thanks.    Lynnell Catalan 01/04/2014, 12:33 PM Zenovia Jarred, PT, DPT 01/04/2014 Pager: [phone]

## 2014-01-04 NOTE — Progress Notes (Signed)
UR completed 

## 2014-01-05 ENCOUNTER — Observation Stay (HOSPITAL_COMMUNITY): Payer: Medicare Other

## 2014-01-05 DIAGNOSIS — M25569 Pain in unspecified knee: Secondary | ICD-10-CM | POA: Diagnosis not present

## 2014-01-05 DIAGNOSIS — S4980XA Other specified injuries of shoulder and upper arm, unspecified arm, initial encounter: Secondary | ICD-10-CM | POA: Diagnosis not present

## 2014-01-05 DIAGNOSIS — I359 Nonrheumatic aortic valve disorder, unspecified: Secondary | ICD-10-CM | POA: Diagnosis not present

## 2014-01-05 DIAGNOSIS — Z7901 Long term (current) use of anticoagulants: Secondary | ICD-10-CM | POA: Diagnosis not present

## 2014-01-05 DIAGNOSIS — S46909A Unspecified injury of unspecified muscle, fascia and tendon at shoulder and upper arm level, unspecified arm, initial encounter: Secondary | ICD-10-CM | POA: Diagnosis not present

## 2014-01-05 DIAGNOSIS — I4891 Unspecified atrial fibrillation: Secondary | ICD-10-CM | POA: Diagnosis not present

## 2014-01-05 DIAGNOSIS — I10 Essential (primary) hypertension: Secondary | ICD-10-CM | POA: Diagnosis not present

## 2014-01-05 DIAGNOSIS — M25519 Pain in unspecified shoulder: Secondary | ICD-10-CM | POA: Diagnosis not present

## 2014-01-05 DIAGNOSIS — T84049A Periprosthetic fracture around unspecified internal prosthetic joint, initial encounter: Secondary | ICD-10-CM | POA: Diagnosis not present

## 2014-01-05 DIAGNOSIS — Z9660 Presence of unspecified orthopedic joint implant: Secondary | ICD-10-CM | POA: Diagnosis not present

## 2014-01-05 MED ORDER — SENNOSIDES-DOCUSATE SODIUM 8.6-50 MG PO TABS
1.0000 | ORAL_TABLET | Freq: Two times a day (BID) | ORAL | Status: DC
  Administered 2014-01-05 – 2014-01-06 (×2): 1 via ORAL
  Filled 2014-01-05 (×3): qty 1

## 2014-01-05 MED ORDER — DIPHENHYDRAMINE HCL 25 MG PO CAPS
25.0000 mg | ORAL_CAPSULE | Freq: Once | ORAL | Status: AC
  Administered 2014-01-05: 25 mg via ORAL
  Filled 2014-01-05: qty 1

## 2014-01-05 NOTE — Progress Notes (Signed)
PROGRESS NOTE  Makayla Thomas [id number] DOB: 02/26/1926 DOA: 01/03/2014 PCP: Londell MohPHARR,WALTER DAVIDSON, MD  HPI: Makayla Thomas is a 80 y.o. female with PMH of HTN, Chronic afib on Xarelto, H/o L knee replacement 2 years ago, in February was found to have L patella fracture detected by Dr. Charlann Boxerlin, treated non operatively, she has noticed, intermittent redness and slight swelling of her L knee off and on for months.  Assessment/Plan:  Left patellar comminuted fracture - consulted orthopedic surgery, appreciate input.  - patient now with severe functional limitation and unable to walk, progressively worsening in the past few months despite conservative treatment.  - PT recommends SNF, patient not safe to return home   Chronic a fib - rate controlled, on A/C  HTN - continue home regimen.   Diet: regular Fluids: none  DVT Prophylaxis: Xarelto  Code Status: Full Family Communication: d/w patient  Disposition Plan: SNF when ready  Consultants:  Orthopedic surgery   Procedures:  None    Antibiotics - none  HPI/Subjective: - feeling well, no knee pain at rest  Objective: Filed Vitals:   01/04/14 2117 01/05/14 0601 01/05/14 0619 01/05/14 1330  BP: 110/70 128/86 136/83 122/80  Pulse: 72 69 84 79  Temp: 99.3 F (37.4 C) 98.2 F (36.8 C) 99.2 F (37.3 C) 98.4 F (36.9 C)  TempSrc: Oral Oral Oral Oral  Resp: 20 20 20 18   Height:      Weight:      SpO2: 93% 95% 97% 98%    Intake/Output Summary (Last 24 hours) at 01/05/14 1348 Last data filed at 01/05/14 1018  Gross per 24 hour  Intake    240 ml  Output      0 ml  Net    240 ml   Filed Weights   01/03/14 1805  Weight: 85.5 kg (188 lb 7.9 oz)    Exam:   General:  NAD  Cardiovascular: regular rate and rhythm, without MRG  Respiratory: good air movement, clear to auscultation throughout, no wheezing, ronchi or rales  Abdomen: soft, not tender to palpation, positive bowel sounds  MSK: no peripheral  edema, L knee with joint effusion, anterior bruising, well demarcated.   Neuro: CN 2-12 grossly intact, MS 5/5 in all 4  Data Reviewed: Basic Metabolic Panel:  Recent Labs Lab 01/03/14 1933 01/04/14 0514  NA 142 141  K 3.4* 3.6*  CL 97 98  CO2 30 31  GLUCOSE 107* 147*  BUN 24* 22  CREATININE 0.84 0.97  CALCIUM 9.9 9.6   CBC:  Recent Labs Lab 01/03/14 1933 01/04/14 0514  WBC 10.0 9.7  HGB 13.3 12.8  HCT 40.0 37.9  MCV 90.3 90.9  PLT 279 234   CBG:  Recent Labs Lab 01/04/14 0312  GLUCAP 124*   Studies: Dg Shoulder Right  01/05/2014   CLINICAL DATA:  Pain post trauma  EXAM: RIGHT SHOULDER - 2+ VIEW  COMPARISON:  None.  FINDINGS: Frontal and Y scapular images were obtained. There is moderate generalized osteoarthritic change. No acute fracture or dislocation. No erosive change.  IMPRESSION: No apparent fracture or dislocation. Moderate generalized osteoarthritic change.   Electronically Signed   By: Bretta BangWilliam  Woodruff M.D.   On: 01/05/2014 10:17   Ct Knee Left Wo Contrast  01/04/2014   CLINICAL DATA:  Pain and swelling.  EXAM: CT OF THE LEFT KNEE WITHOUT CONTRAST  TECHNIQUE: Multidetector CT imaging of the left knee was performed according to the standard protocol. Multiplanar CT image reconstructions were  also generated.  COMPARISON:  Radiographs dated 06/10/2010  FINDINGS: There is a comminuted fracture of the patella with approximately 12 mm of distraction between the most proximal and distal fragments. There is a prominent joint effusion. The femoral and tibial components of the prosthesis are in good position without evidence of loosening. Detail is degraded by metallic artifact. There are no appreciable fractures of the distal femur or proximal tibia or proximal fibula.  IMPRESSION: Comminuted fracture of the patella.  Critical Value/emergent results were called by telephone by Dr. Salome Holmes at the time of the preliminary interpretation on 01/03/2014 at 10:35 p.m. to  Dr. Jeanella Anton, RN, who verbally acknowledged these results.   Electronically Signed   By: Geanie Cooley M.D.   On: 01/04/2014 07:12    Scheduled Meds: . bisoprolol-hydrochlorothiazide  1 tablet Oral Daily  . cholecalciferol  1,000 Units Oral Daily  . diltiazem  120 mg Oral QHS  . diphenhydrAMINE  12.5 mg Oral Once  . levothyroxine  125 mcg Oral QAC breakfast  . polyethylene glycol  17 g Oral Daily  . Rivaroxaban  15 mg Oral Daily  . senna-docusate  1 tablet Oral BID   Continuous Infusions:   Principal Problem:   Left knee pain Active Problems:   Aortic stenosis, mild   Permanent atrial fibrillation   Chronic anticoagulation   Essential hypertension -well controlled   Time spent: 25  This note has been created with Education officer, environmental. Any transcriptional errors are unintentional.   Pamella Pert, MD [facility] Pager [phone]. If 7 PM - 7 AM, please contact night-coverage at [email or web address], password [facility] 01/05/2014, 1:48 PM  LOS: 2 days

## 2014-01-05 NOTE — Progress Notes (Signed)
Patient states that PT recommended rehab when discharged and that she is agreeable to this decision.  She states that she would like to speak with the social worker about placement choices because her son would like to visit/check out their selections.  Notified Holly(social worker) that patient would like to see her

## 2014-01-05 NOTE — Progress Notes (Signed)
Clinical Social Work Department CLINICAL SOCIAL WORK PLACEMENT NOTE 01/05/2014  Patient:  KARINGTON, BIRDSONG  Account Number:  [id number] Admit date:  01/03/2014  Clinical Social Worker:  Unk Lightning, LCSW  Date/time:  01/05/2014 04:00 PM  Clinical Social Work is seeking post-discharge placement for this patient at the following level of care:   SKILLED NURSING   (*CSW will update this form in Epic as items are completed)   01/05/2014  Patient/family provided with [name] Department of Clinical Social Work's list of facilities offering this level of care within the geographic area requested by the patient (or if unable, by the patient's family).  01/05/2014  Patient/family informed of their freedom to choose among providers that offer the needed level of care, that participate in Medicare, Medicaid or managed care program needed by the patient, have an available bed and are willing to accept the patient.  01/05/2014  Patient/family informed of [facility]' ownership interest in [facility], as well as of the fact that they are under no obligation to receive care at this facility.  PASARR submitted to EDS on existing # PASARR number received from EDS on   FL2 transmitted to all facilities in geographic area requested by pt/family on  01/05/2014 FL2 transmitted to all facilities within larger geographic area on   Patient informed that his/her managed care company has contracts with or will negotiate with  certain facilities, including the following:     Patient/family informed of bed offers received:   Patient chooses bed at  Physician recommends and patient chooses bed at    Patient to be transferred to  on   Patient to be transferred to facility by   The following physician request were entered in Epic:   Additional Comments:

## 2014-01-05 NOTE — Evaluation (Addendum)
Physical Therapy Evaluation Patient Details Name: Makayla Thomas MRN: [id number] DOB: 16-Jan-1926 Today's Date: 01/05/2014   History of Present Illness  Makayla Thomas is a 80 y.o. female with PMH of MS, HTN, Chronic afib on Xarelto, H/o L knee replacement 2 years ago, in February was found to have L patella fracture (conservative tx) detected by Dr. Charlann Boxer. Pt admitted with inablility to walk or WB on LLE. Redness around L knee is thought to be due to anticoagulation. Imaging shows L comminuted patella fx. No plan for surgery.  Clinical Impression  *Pt admitted with LLE weakness, inability to walk**. Pt currently with functional limitations due to the deficits listed below (see PT Problem List).  Pt will benefit from skilled PT to increase their independence and safety with mobility to allow discharge to the venue listed below.    Makayla Thomas ambulated 75' with RW and +2 assist for safety due to fall risk.  Her RLE buckled x 2 but she was able to recover independently using RW. She would benefit from ST-SNF rehab stay to maximize safety and independence with mobility.  **    Follow Up Recommendations SNF    Equipment Recommendations  Rolling walker with 5" wheels    Recommendations for Other Services       Precautions / Restrictions Precautions Precautions: Fall Restrictions Weight Bearing Restrictions: No      Mobility  Bed Mobility Overal bed mobility: Modified Independent             General bed mobility comments: supine to sit, pt pulled up on sheets with BUE  Transfers Overall transfer level: Needs assistance Equipment used: Rolling walker (2 wheeled) Transfers: Sit to/from Stand  Min assist to rise, multiple attempts required, increased time              Ambulation/Gait Ambulation/Gait assistance: +2 safety/equipment Ambulation Distance (Feet): 74 Feet Assistive device: Rolling walker (2 wheeled) Gait Pattern/deviations: Decreased stride length;Trunk  flexed   Gait velocity interpretation: Below normal speed for age/gender General Gait Details: RLE buckled x 2 while walking, pt able to catch herself with BUE support on RW, +2 for safety due to fall risk  Stairs            Wheelchair Mobility    Modified Rankin (Stroke Patients Only)       Balance Overall balance assessment: Needs assistance Sitting-balance support: Feet supported;Single extremity supported Sitting balance-Leahy Scale: Fair     Standing balance support: Bilateral upper extremity supported Standing balance-Leahy Scale: Poor                               Pertinent Vitals/Pain *unrated R shoulder pain with elevation, relieved when at rest**    Home Living Family/patient expects to be discharged to:: Other (Comment) (independent living) Living Arrangements: Alone Available Help at Discharge: Skilled Nursing Facility           Home Equipment: Dan Humphreys - 2 wheels;Walker - 4 wheels;Toilet riser;Grab bars - tub/shower;Shower seat      Prior Function Level of Independence: Independent with assistive device(s)         Comments: walked to dining room with rollator, independent with ADLs     Hand Dominance        Extremity/Trunk Assessment   Upper Extremity Assessment: RUE deficits/detail RUE Deficits / Details: R shoulder flexion painful, more comfortable with abduction, elbow flexion/extension 4/5, grasp 4/5  Lower Extremity Assessment: RLE deficits/detail;LLE deficits/detail RLE Deficits / Details: knee extension 3/5, ankle DF 4/5, hip flexion 3/5, AROM WFL throughout LLE Deficits / Details: knee extension 4/5, ankle DF 5/5, hip flexion +3/5  Cervical / Trunk Assessment: Normal  Communication   Communication: No difficulties  Cognition Arousal/Alertness: Awake/alert Behavior During Therapy: WFL for tasks assessed/performed Overall Cognitive Status: Within Functional Limits for tasks assessed                       General Comments      Exercises        Assessment/Plan    PT Assessment Patient needs continued PT services  PT Diagnosis Difficulty walking;Generalized weakness   PT Problem List Decreased strength;Decreased activity tolerance;Decreased balance;Decreased mobility  PT Treatment Interventions Functional mobility training;Gait training;Therapeutic exercise;Therapeutic activities;Patient/family education   PT Goals (Current goals can be found in the Care Plan section) Acute Rehab PT Goals Patient Stated Goal: to be able to walk farther PT Goal Formulation: With patient Time For Goal Achievement: 01/19/14 Potential to Achieve Goals: Good    Frequency Min 3X/week   Barriers to discharge        Co-evaluation               End of Session Equipment Utilized During Treatment: Gait belt Activity Tolerance: Patient tolerated treatment well Patient left: in chair;with call bell/phone within reach Nurse Communication: Mobility status    Functional Assessment Tool Used: clinical judgement Functional Limitation: Mobility: Walking and moving around Mobility: Walking and Moving Around Current Status [phone](G8978): At least 20 percent but less than 40 percent impaired, limited or restricted Mobility: Walking and Moving Around Goal Status [phone](G8979): At least 1 percent but less than 20 percent impaired, limited or restricted    Time: 1257-1350 PT Time Calculation (min): 53 min   Charges:   PT Evaluation $Initial PT Evaluation Tier I: 1 Procedure PT Treatments $Gait Training: 8-22 mins $Therapeutic Activity: 8-22 mins   PT G Codes:   Functional Assessment Tool Used: clinical judgement Functional Limitation: Mobility: Walking and moving around    Constellation EnergyJennifer K Teoman Giraud 01/05/2014, 2:15 PM [phone]

## 2014-01-05 NOTE — Progress Notes (Signed)
Clinical Social Work Department BRIEF PSYCHOSOCIAL ASSESSMENT 01/05/2014  Patient:  Makayla Thomas, Makayla Thomas     Account Number:  [id number]     Admit date:  01/03/2014  Clinical Social Worker:  Earlie Server  Date/Time:  01/05/2014 03:30 PM  Referred by:  Physician  Date Referred:  01/05/2014 Referred for  SNF Placement   Other Referral:   Interview type:  Patient Other interview type:    PSYCHOSOCIAL DATA Living Status:  FACILITY Admitted from facility:   Level of care:  Independent Living Primary support name:  [name] Primary support relationship to patient:  CHILD, ADULT Degree of support available:   Strong    CURRENT CONCERNS Current Concerns  Post-Acute Placement   Other Concerns:    SOCIAL WORK ASSESSMENT / PLAN CSW received referral to assist with DC planning. CSW reviewed chart and met with patient at bedside. CSW introduced myself and explained role. PT is recommending SNF placement but CSW staffed case with CM who confirms that patient is only meeting observation status at this time.    CSW spoke with patient who reports she moved from [place] to [place] in 2010. Patient reports that she currently lives at [facility] and enjoys living there and being independent but due to knee problems feels that she needs ST SNF. CSW explained that Medicare would not cover SNF stay due to observation status and patient reports understanding. CSW also provided SNF list and explained process. Patient reports that son would want to know information as well so CSW spoke with son on the phone while in patient's room. Patient and son report understanding that patient would have to pay privately and some facilities require payment up front. CSW explained that son could review list and tour facilities so that they would have a decision at DC. CSW emailed son a list as well so he would not have to wait until coming to visit patient at the hospital to have the list.    CSW completed FL2 and faxed out.  Weekend CSW will follow up with bed offers and patient aware that she will need to have facility chosen quickly in order to not delay DC.    CSW will continue to follow   Assessment/plan status:  Psychosocial Support/Ongoing Assessment of Needs Other assessment/ plan:   Information/referral to community resources:   SNF information  Medicare.gov website for information re: inpatient vs observation stay    PATIENT'S/FAMILY'S RESPONSE TO PLAN OF CARE: Patient alert and oriented. Patient enjoys being independent and reports she feels like a burden to have to involve son with decision making. Patient reports that she cared for husband but since he has passed away she has felt she needs more assistance from family. Patient and son agreeable to pay privately for SNF and aware that Medicare might cover some therapy costs but not room and board. Patient and son aware of DC plans and agreeable to start reviewing SNF list.       Sindy Messing, LCSW [phone]

## 2014-01-06 DIAGNOSIS — M25569 Pain in unspecified knee: Secondary | ICD-10-CM | POA: Diagnosis not present

## 2014-01-06 DIAGNOSIS — Z7901 Long term (current) use of anticoagulants: Secondary | ICD-10-CM | POA: Diagnosis not present

## 2014-01-06 DIAGNOSIS — T84049A Periprosthetic fracture around unspecified internal prosthetic joint, initial encounter: Secondary | ICD-10-CM | POA: Diagnosis not present

## 2014-01-06 DIAGNOSIS — I359 Nonrheumatic aortic valve disorder, unspecified: Secondary | ICD-10-CM | POA: Diagnosis not present

## 2014-01-06 DIAGNOSIS — M25519 Pain in unspecified shoulder: Secondary | ICD-10-CM | POA: Diagnosis not present

## 2014-01-06 DIAGNOSIS — I4891 Unspecified atrial fibrillation: Secondary | ICD-10-CM | POA: Diagnosis not present

## 2014-01-06 DIAGNOSIS — Z9660 Presence of unspecified orthopedic joint implant: Secondary | ICD-10-CM | POA: Diagnosis not present

## 2014-01-06 DIAGNOSIS — I10 Essential (primary) hypertension: Secondary | ICD-10-CM | POA: Diagnosis not present

## 2014-01-06 DIAGNOSIS — S82009A Unspecified fracture of unspecified patella, initial encounter for closed fracture: Secondary | ICD-10-CM | POA: Diagnosis present

## 2014-01-06 MED ORDER — GLYCERIN (LAXATIVE) 2.1 G RE SUPP
1.0000 | Freq: Once | RECTAL | Status: AC
  Administered 2014-01-06: 10:00:00 via RECTAL
  Filled 2014-01-06: qty 1

## 2014-01-06 MED ORDER — HYDROCODONE-ACETAMINOPHEN 5-325 MG PO TABS: 1.0000 | ORAL_TABLET | Freq: Four times a day (QID) | ORAL | Status: DC | PRN

## 2014-01-06 NOTE — Progress Notes (Signed)
Report called to Nurse at [facility].  Patient discharged and left by private vehicle.

## 2014-01-06 NOTE — Progress Notes (Signed)
Reviewed bed offers with Pt and then with Pt's son via phone, at Pt's request.  Pt's son very unhappy with the situation and feels that Pt is being pushed out too soon. He stated that the has not been able to do any research on the facilities that have made offers, save for the [place]care[place] website, which he didn't feel helped very much.  CSW validated Pt's son frustrations and concerns and offered assistance in any way.  Pt's son to discuss situation with Pt's other 2 sons and have a decision shortly.  Providence Crosby, LCSW Clinical Social Work [phone]

## 2014-01-06 NOTE — Progress Notes (Addendum)
CARE MANAGEMENT NOTE 01/06/2014  Patient:  Makayla Thomas, Makayla Thomas   Account Number:  [id number]  Date Initiated:  01/06/2014  Documentation initiated by:  [facility]  Subjective/Objective Assessment:     Action/Plan:   lives at [facility] [place]   Anticipated DC Date:  01/06/2014   Anticipated DC Plan:  SKILLED NURSING FACILITY      DC Planning Services  CM consult      Choice offered to / List presented to:             Status of service:  Completed, signed off Medicare Important Message given?  YES (If response is "NO", the following Medicare IM given date fields will be blank) Date Medicare IM given:  01/03/2014 Date Additional Medicare IM given:    Discharge Disposition:  SKILLED NURSING FACILITY  Per UR Regulation:    If discussed at Long Length of Stay Meetings, dates discussed:    Comments:  01/06/2014 1805  NCM spoke to son, Makayla Thomas. States they will plan to dc SNF today.  Refuses ABN at this time. Will pay for SNF out of pocket. CSW working for placement today.  Isidoro Donning RN CCM Case Mgmt phone [phone]  01/06/2014 1800 Faxed requested paperwork to [facility] for follow up on appeal. Notified pt has decided to dc home today. Isidoro Donning RN Case Mgmt phone [phone]  01/06/2014 1700 Received paperwork from [facility] for appeal review. Isidoro Donning RN CCM Case Mgmt phone [phone]  01/06/2014 1530 NCM spoke to pt's son, Makayla Thomas [phone]. States they are out looking at facilities for rehab for pt. States he has requested an appeal with [employer]. NCM explained Medicare ABN that if appeal is denied they will responsible for payment of hospital stay post dc. Isidoro Donning RN CCM Case Mgmt phone [phone]

## 2014-01-06 NOTE — Progress Notes (Signed)
Per RNCM, Pt or family can file an appeal with [employer] re: "Observation" status.  RNCM notified son of this and stated that, per [employer], the appeal wasn't yet complete.  LM for Pt's son asking how he'd like to proceed.  MD made aware of the situation.  Providence CrosbyAmanda Shantoya Geurts, LCSW Clinical Social Work [phone]

## 2014-01-06 NOTE — Progress Notes (Signed)
Spoke with Pt's son who is very upset by the situation, however he feels that it's in Pt's best interest to d/c to SNF today.  [facility]  CSW spoke with the Admissions Director, Tresa EndoKelly, who stated that it's too late in the day; they cannot accommodate Pt today but they can tomorrow.  Notified Pt's son of Kelly's stance.  Pt's son not wanting to wait until tomorrow and asked that CSW contact [facility] to inquire about admission today.  LM for [employer]ell, Scientist, research (physical sciences)Admissions Coordinator at [facility].  Providence CrosbyAmanda Rejeana Fadness, LCSW Clinical Social Work [phone]

## 2014-01-06 NOTE — Discharge Instructions (Signed)

## 2014-01-06 NOTE — Progress Notes (Signed)
Per Greig Right at [facility], facility can accept Pt today, however the facility requires $15,000 up front.  Family and Pt made aware.  Family and Pt agree to pay this amount and want to d/c today to [facility].  [facility] aware and ready to receive Pt.  Faxed dc summary.  Family to provide transportation.  RN aware.  Pt to d/c.  Providence Crosby, LCSW Clinical Social Work [phone]

## 2014-01-06 NOTE — Progress Notes (Signed)
CSW called Pt's son at 26 to discuss d/c plans.  Pt's son on his way to tour facilities.  He notified CSW that he has officially made an appeal with [employer].  CSW called Pt at the current time.  Pt's son angry that neither of the 2 facilities that he's toured thus far knew anything about Pt's d/c today.  CSW explained to Pt's son that CSW would not be in a position to notify any of the facilities that have offered beds of Pt's potential d/c today unless he had communicated to CSW which facility he had chosen for Pt.  Pt's son then stated that he is currently touring the [facility] but that he is not going to make any decision on Pt's d/c until he hears back from [place].  When CSW explained to Pt's son that there is no appeal process for a Pt under "Observation" status, Pt's son stated that he has a reference number and that the appeal was made yesterday.   Message from CSW Department Director, [facility], instructing CSW to contact Dr. Jacky Kindle if the d/c becomes an issue.  Spoke with Dr. Jacky Kindle and updated him re: the situation.  Dr. Jacky Kindle instructed CSW to notify RNCM to issue an "ABN."   RNCM made aware.  CSW to continue to follow.  Providence Crosby, LCSW Clinical Social Work [phone]

## 2014-01-06 NOTE — Discharge Summary (Addendum)
Physician Discharge Summary  Maxyne Steidinger TIR:[id number] DOB: 11/14/1925 DOA: 01/03/2014  PCP: Londell Moh, MD  Admit date: 01/03/2014 Discharge date: 01/06/2014  Time spent: 35 minutes  Recommendations for Outpatient Follow-up:  1. Follow up with Dr. Charlann Boxer as an outpatient 2. Follow up with cardiology regularly 3. Follow up with PCP in 1-2 weeks  Discharge Diagnoses:  Principal Problem:   Left knee pain Active Problems:   Aortic stenosis, mild   Permanent atrial fibrillation   Chronic anticoagulation   Essential hypertension -well controlled   Patellar fracture  Discharge Condition: stable  Diet recommendation: heart healthy  Filed Weights   01/03/14 1805  Weight: 85.5 kg (188 lb 7.9 oz)    History of present illness:  Vertie Krakow is a 80 y.o. female with PMH of HTN, Chronic afib on Xarelto, H/o L knee replacement 2 years ago, in February was found to have L patella fracture detected by Dr. Charlann Boxer, treated non operatively, she has noticed, intermittent redness and slight swelling of her L knee off and on for months. She lives alone and id independent, On Sunday she noticed stiffness and mild pain/discomfort of L knee with movement, her son came home to help her and noticed a small 2x3cm area of redness overlying the patella, she was somehow able to get back in bed that night, Monday and Tuesday she felt better and was able to ambulate close to baseline albeit with intermittent discomfort.  This am, she noticed the redness was more diffuse and had more difficulty moving her L knee, was unable to stand, bear weight. Denies any trauma, fall, no fevers no chills. Went to see dr.Olin who did Xrays which revealed recent patella Fx, without anything acute, and sent her back to her PCP, who referred her to [facility] as a direct admission for further evaluation.  Hospital Course:  Left patellar comminuted fracture - consulted orthopedic surgery, appreciate input, looks like  there is nothing surgical that would be of benefit, and currently will pursue conservative management, physical therapy. PT recommends SNF, patient not safe to return home.  Chronic a fib - rate controlled, on A/C. She had a small knee bleed with localized ecchymosis, I would favor continuing anticoagulation for now. If she develops severe bleeding in the future, this needs to be readdressed likely in conjunction with her cardiologist/PT/orthopedic surgeon. HTN - continue home regimen, blood pressure well controlled in the hospital.   Right shoulder pain - due to overworking her shoulder in the setting of knee problems, XR without acute findings but osteoarthritis. Recommend physical therapy and symptomatic control.  Aortic stenosis - followed by cardiology as an outpatient, no chest pain, no dyspnea and is otherwise stable.    Procedures:  None    Consultations:  Orthopedic surgery   Discharge Exam: Filed Vitals:   01/05/14 0619 01/05/14 1330 01/05/14 2106 01/06/14 0528  BP: 136/83 122/80 111/69 115/79  Pulse: 84 79 74 68  Temp: 99.2 F (37.3 C) 98.4 F (36.9 C) 98.8 F (37.1 C) 98.8 F (37.1 C)  TempSrc: Oral Oral Oral Oral  Resp: 20 18 20 20   Height:      Weight:      SpO2: 97% 98% 96% 97%   General: NAD Cardiovascular: RRR Respiratory: CTA biL  Discharge Instructions     Medication List         acetaminophen 500 MG tablet  Commonly known as:  TYLENOL  Take 500 mg by mouth every 6 (six) hours as needed  for pain.     alendronate 70 MG tablet  Commonly known as:  FOSAMAX  Take 70 mg by mouth once a week.     bisoprolol-hydrochlorothiazide 10-6.25 MG per tablet  Commonly known as:  ZIAC  Take 1 tablet by mouth daily.     cholecalciferol 1000 UNITS tablet  Commonly known as:  VITAMIN D  Take 1,000 Units by mouth daily.     diclofenac sodium 1 % Gel  Commonly known as:  VOLTAREN  Apply 3 g topically 2 (two) times daily as needed (knee pain).     diltiazem  120 MG 24 hr capsule  Commonly known as:  CARDIZEM CD  Take 120 mg by mouth at bedtime.     furosemide 20 MG tablet  Commonly known as:  LASIX  Take 20 mg by mouth 2 (two) times daily.     HYDROcodone-acetaminophen 5-325 MG per tablet  Commonly known as:  NORCO/VICODIN  Take 1 tablet by mouth every 6 (six) hours as needed for moderate pain.     LIVALO 1 MG Tabs  Generic drug:  Pitavastatin Calcium  Take 1 mg by mouth daily.     SYNTHROID 125 MCG tablet  Generic drug:  levothyroxine  Take 125 mcg by mouth daily.     XARELTO 15 MG Tabs tablet  Generic drug:  Rivaroxaban  Take 15 mg by mouth daily.       Follow-up Information   Follow up with Shelda PalLIN,MATTHEW D, MD. Schedule an appointment as soon as possible for a visit in 4 weeks.   Specialty:  Orthopedic Surgery   Contact information:   [address] [place] [address] [phone]      The results of significant diagnostics from this hospitalization (including imaging, microbiology, ancillary and laboratory) are listed below for reference.    Significant Diagnostic Studies: Dg Shoulder Right  01/05/2014   CLINICAL DATA:  Pain post trauma  EXAM: RIGHT SHOULDER - 2+ VIEW  COMPARISON:  None.  FINDINGS: Frontal and Y scapular images were obtained. There is moderate generalized osteoarthritic change. No acute fracture or dislocation. No erosive change.  IMPRESSION: No apparent fracture or dislocation. Moderate generalized osteoarthritic change.   Electronically Signed   By: Bretta BangWilliam  Woodruff M.D.   On: 01/05/2014 10:17   Ct Knee Left Wo Contrast  01/04/2014   CLINICAL DATA:  Pain and swelling.  EXAM: CT OF THE LEFT KNEE WITHOUT CONTRAST  TECHNIQUE: Multidetector CT imaging of the left knee was performed according to the standard protocol. Multiplanar CT image reconstructions were also generated.  COMPARISON:  Radiographs dated 06/10/2010  FINDINGS: There is a comminuted fracture of the patella with approximately  12 mm of distraction between the most proximal and distal fragments. There is a prominent joint effusion. The femoral and tibial components of the prosthesis are in good position without evidence of loosening. Detail is degraded by metallic artifact. There are no appreciable fractures of the distal femur or proximal tibia or proximal fibula.  IMPRESSION: Comminuted fracture of the patella.  Critical Value/emergent results were called by telephone by Dr. Salome HolmesHector Cooper at the time of the preliminary interpretation on 01/03/2014 at 10:35 p.m. to Dr. Jeanella Antonrystal Mitchell, RN, who verbally acknowledged these results.   Electronically Signed   By: Geanie CooleyJim  Maxwell M.D.   On: 01/04/2014 07:12   Labs: Basic Metabolic Panel:  Recent Labs Lab 01/03/14 1933 01/04/14 0514  NA 142 141  K 3.4* 3.6*  CL 97 98  CO2  30 31  GLUCOSE 107* 147*  BUN 24* 22  CREATININE 0.84 0.97  CALCIUM 9.9 9.6   CBC:  Recent Labs Lab 01/03/14 1933 01/04/14 0514  WBC 10.0 9.7  HGB 13.3 12.8  HCT 40.0 37.9  MCV 90.3 90.9  PLT 279 234   CBG:  Recent Labs Lab 01/04/14 0312  GLUCAP 124*   Signed:  Costin M Gherghe  [facility] 01/06/2014, 10:06 AM

## 2014-01-09 DIAGNOSIS — M6281 Muscle weakness (generalized): Secondary | ICD-10-CM | POA: Diagnosis not present

## 2014-01-09 DIAGNOSIS — Q230 Congenital stenosis of aortic valve: Secondary | ICD-10-CM | POA: Diagnosis not present

## 2014-01-09 DIAGNOSIS — Z5189 Encounter for other specified aftercare: Secondary | ICD-10-CM | POA: Diagnosis not present

## 2014-01-09 DIAGNOSIS — M8440XA Pathological fracture, unspecified site, initial encounter for fracture: Secondary | ICD-10-CM | POA: Diagnosis not present

## 2014-01-11 DIAGNOSIS — M25569 Pain in unspecified knee: Secondary | ICD-10-CM | POA: Diagnosis not present

## 2014-01-11 DIAGNOSIS — I359 Nonrheumatic aortic valve disorder, unspecified: Secondary | ICD-10-CM | POA: Diagnosis not present

## 2014-01-11 DIAGNOSIS — I4891 Unspecified atrial fibrillation: Secondary | ICD-10-CM | POA: Diagnosis not present

## 2014-01-11 DIAGNOSIS — I10 Essential (primary) hypertension: Secondary | ICD-10-CM | POA: Diagnosis not present

## 2014-01-12 DIAGNOSIS — Z5189 Encounter for other specified aftercare: Secondary | ICD-10-CM | POA: Diagnosis not present

## 2014-01-12 DIAGNOSIS — M6281 Muscle weakness (generalized): Secondary | ICD-10-CM | POA: Diagnosis not present

## 2014-01-12 DIAGNOSIS — Q230 Congenital stenosis of aortic valve: Secondary | ICD-10-CM | POA: Diagnosis not present

## 2014-01-12 DIAGNOSIS — M8440XA Pathological fracture, unspecified site, initial encounter for fracture: Secondary | ICD-10-CM | POA: Diagnosis not present

## 2014-01-16 DIAGNOSIS — I4891 Unspecified atrial fibrillation: Secondary | ICD-10-CM | POA: Diagnosis not present

## 2014-01-16 DIAGNOSIS — I359 Nonrheumatic aortic valve disorder, unspecified: Secondary | ICD-10-CM | POA: Diagnosis not present

## 2014-01-16 DIAGNOSIS — M25569 Pain in unspecified knee: Secondary | ICD-10-CM | POA: Diagnosis not present

## 2014-01-18 DIAGNOSIS — I4891 Unspecified atrial fibrillation: Secondary | ICD-10-CM | POA: Diagnosis not present

## 2014-01-18 DIAGNOSIS — IMO0001 Reserved for inherently not codable concepts without codable children: Secondary | ICD-10-CM | POA: Diagnosis not present

## 2014-01-18 DIAGNOSIS — I359 Nonrheumatic aortic valve disorder, unspecified: Secondary | ICD-10-CM | POA: Diagnosis not present

## 2014-01-18 DIAGNOSIS — R262 Difficulty in walking, not elsewhere classified: Secondary | ICD-10-CM | POA: Diagnosis not present

## 2014-01-18 DIAGNOSIS — I10 Essential (primary) hypertension: Secondary | ICD-10-CM | POA: Diagnosis not present

## 2014-01-22 DIAGNOSIS — Z7902 Long term (current) use of antithrombotics/antiplatelets: Secondary | ICD-10-CM | POA: Diagnosis not present

## 2014-01-22 DIAGNOSIS — S82009A Unspecified fracture of unspecified patella, initial encounter for closed fracture: Secondary | ICD-10-CM | POA: Diagnosis not present

## 2014-01-22 DIAGNOSIS — Z8673 Personal history of transient ischemic attack (TIA), and cerebral infarction without residual deficits: Secondary | ICD-10-CM | POA: Diagnosis not present

## 2014-01-22 DIAGNOSIS — I739 Peripheral vascular disease, unspecified: Secondary | ICD-10-CM | POA: Diagnosis not present

## 2014-01-22 DIAGNOSIS — M25519 Pain in unspecified shoulder: Secondary | ICD-10-CM | POA: Diagnosis not present

## 2014-01-22 DIAGNOSIS — G35 Multiple sclerosis: Secondary | ICD-10-CM | POA: Diagnosis not present

## 2014-01-22 DIAGNOSIS — M159 Polyosteoarthritis, unspecified: Secondary | ICD-10-CM | POA: Diagnosis not present

## 2014-01-22 DIAGNOSIS — E876 Hypokalemia: Secondary | ICD-10-CM | POA: Diagnosis not present

## 2014-01-23 DIAGNOSIS — I10 Essential (primary) hypertension: Secondary | ICD-10-CM | POA: Diagnosis not present

## 2014-01-23 DIAGNOSIS — I4891 Unspecified atrial fibrillation: Secondary | ICD-10-CM | POA: Diagnosis not present

## 2014-01-23 DIAGNOSIS — IMO0001 Reserved for inherently not codable concepts without codable children: Secondary | ICD-10-CM | POA: Diagnosis not present

## 2014-01-23 DIAGNOSIS — R262 Difficulty in walking, not elsewhere classified: Secondary | ICD-10-CM | POA: Diagnosis not present

## 2014-01-23 DIAGNOSIS — I359 Nonrheumatic aortic valve disorder, unspecified: Secondary | ICD-10-CM | POA: Diagnosis not present

## 2014-01-24 DIAGNOSIS — I359 Nonrheumatic aortic valve disorder, unspecified: Secondary | ICD-10-CM | POA: Diagnosis not present

## 2014-01-24 DIAGNOSIS — IMO0001 Reserved for inherently not codable concepts without codable children: Secondary | ICD-10-CM | POA: Diagnosis not present

## 2014-01-24 DIAGNOSIS — I4891 Unspecified atrial fibrillation: Secondary | ICD-10-CM | POA: Diagnosis not present

## 2014-01-24 DIAGNOSIS — R262 Difficulty in walking, not elsewhere classified: Secondary | ICD-10-CM | POA: Diagnosis not present

## 2014-01-24 DIAGNOSIS — I10 Essential (primary) hypertension: Secondary | ICD-10-CM | POA: Diagnosis not present

## 2014-01-26 DIAGNOSIS — I359 Nonrheumatic aortic valve disorder, unspecified: Secondary | ICD-10-CM | POA: Diagnosis not present

## 2014-01-26 DIAGNOSIS — I10 Essential (primary) hypertension: Secondary | ICD-10-CM | POA: Diagnosis not present

## 2014-01-26 DIAGNOSIS — M6281 Muscle weakness (generalized): Secondary | ICD-10-CM | POA: Diagnosis not present

## 2014-01-26 DIAGNOSIS — IMO0001 Reserved for inherently not codable concepts without codable children: Secondary | ICD-10-CM | POA: Diagnosis not present

## 2014-01-26 DIAGNOSIS — I4891 Unspecified atrial fibrillation: Secondary | ICD-10-CM | POA: Diagnosis not present

## 2014-01-26 DIAGNOSIS — R262 Difficulty in walking, not elsewhere classified: Secondary | ICD-10-CM | POA: Diagnosis not present

## 2014-01-30 DIAGNOSIS — IMO0001 Reserved for inherently not codable concepts without codable children: Secondary | ICD-10-CM | POA: Diagnosis not present

## 2014-01-30 DIAGNOSIS — I10 Essential (primary) hypertension: Secondary | ICD-10-CM | POA: Diagnosis not present

## 2014-01-30 DIAGNOSIS — R262 Difficulty in walking, not elsewhere classified: Secondary | ICD-10-CM | POA: Diagnosis not present

## 2014-01-30 DIAGNOSIS — I359 Nonrheumatic aortic valve disorder, unspecified: Secondary | ICD-10-CM | POA: Diagnosis not present

## 2014-01-30 DIAGNOSIS — I4891 Unspecified atrial fibrillation: Secondary | ICD-10-CM | POA: Diagnosis not present

## 2014-01-31 DIAGNOSIS — Z96659 Presence of unspecified artificial knee joint: Secondary | ICD-10-CM | POA: Diagnosis not present

## 2014-01-31 DIAGNOSIS — M25569 Pain in unspecified knee: Secondary | ICD-10-CM | POA: Diagnosis not present

## 2014-01-31 DIAGNOSIS — IMO0002 Reserved for concepts with insufficient information to code with codable children: Secondary | ICD-10-CM | POA: Diagnosis not present

## 2014-02-01 DIAGNOSIS — IMO0001 Reserved for inherently not codable concepts without codable children: Secondary | ICD-10-CM | POA: Diagnosis not present

## 2014-02-01 DIAGNOSIS — I10 Essential (primary) hypertension: Secondary | ICD-10-CM | POA: Diagnosis not present

## 2014-02-01 DIAGNOSIS — R262 Difficulty in walking, not elsewhere classified: Secondary | ICD-10-CM | POA: Diagnosis not present

## 2014-02-01 DIAGNOSIS — I359 Nonrheumatic aortic valve disorder, unspecified: Secondary | ICD-10-CM | POA: Diagnosis not present

## 2014-02-01 DIAGNOSIS — I4891 Unspecified atrial fibrillation: Secondary | ICD-10-CM | POA: Diagnosis not present

## 2014-02-06 DIAGNOSIS — I10 Essential (primary) hypertension: Secondary | ICD-10-CM | POA: Diagnosis not present

## 2014-02-06 DIAGNOSIS — I4891 Unspecified atrial fibrillation: Secondary | ICD-10-CM | POA: Diagnosis not present

## 2014-02-06 DIAGNOSIS — R262 Difficulty in walking, not elsewhere classified: Secondary | ICD-10-CM | POA: Diagnosis not present

## 2014-02-06 DIAGNOSIS — I359 Nonrheumatic aortic valve disorder, unspecified: Secondary | ICD-10-CM | POA: Diagnosis not present

## 2014-02-06 DIAGNOSIS — IMO0001 Reserved for inherently not codable concepts without codable children: Secondary | ICD-10-CM | POA: Diagnosis not present

## 2014-02-08 DIAGNOSIS — I359 Nonrheumatic aortic valve disorder, unspecified: Secondary | ICD-10-CM | POA: Diagnosis not present

## 2014-02-08 DIAGNOSIS — I4891 Unspecified atrial fibrillation: Secondary | ICD-10-CM | POA: Diagnosis not present

## 2014-02-08 DIAGNOSIS — IMO0001 Reserved for inherently not codable concepts without codable children: Secondary | ICD-10-CM | POA: Diagnosis not present

## 2014-02-08 DIAGNOSIS — R262 Difficulty in walking, not elsewhere classified: Secondary | ICD-10-CM | POA: Diagnosis not present

## 2014-02-08 DIAGNOSIS — I10 Essential (primary) hypertension: Secondary | ICD-10-CM | POA: Diagnosis not present

## 2014-02-10 DIAGNOSIS — IMO0001 Reserved for inherently not codable concepts without codable children: Secondary | ICD-10-CM | POA: Diagnosis not present

## 2014-02-10 DIAGNOSIS — I359 Nonrheumatic aortic valve disorder, unspecified: Secondary | ICD-10-CM | POA: Diagnosis not present

## 2014-02-10 DIAGNOSIS — I4891 Unspecified atrial fibrillation: Secondary | ICD-10-CM | POA: Diagnosis not present

## 2014-02-10 DIAGNOSIS — R262 Difficulty in walking, not elsewhere classified: Secondary | ICD-10-CM | POA: Diagnosis not present

## 2014-02-10 DIAGNOSIS — I10 Essential (primary) hypertension: Secondary | ICD-10-CM | POA: Diagnosis not present

## 2014-02-12 DIAGNOSIS — M810 Age-related osteoporosis without current pathological fracture: Secondary | ICD-10-CM | POA: Diagnosis not present

## 2014-02-12 DIAGNOSIS — I4891 Unspecified atrial fibrillation: Secondary | ICD-10-CM | POA: Diagnosis not present

## 2014-02-12 DIAGNOSIS — I10 Essential (primary) hypertension: Secondary | ICD-10-CM | POA: Diagnosis not present

## 2014-02-12 DIAGNOSIS — E7800 Pure hypercholesterolemia, unspecified: Secondary | ICD-10-CM | POA: Diagnosis not present

## 2014-02-13 DIAGNOSIS — I10 Essential (primary) hypertension: Secondary | ICD-10-CM | POA: Diagnosis not present

## 2014-02-13 DIAGNOSIS — R262 Difficulty in walking, not elsewhere classified: Secondary | ICD-10-CM | POA: Diagnosis not present

## 2014-02-13 DIAGNOSIS — IMO0001 Reserved for inherently not codable concepts without codable children: Secondary | ICD-10-CM | POA: Diagnosis not present

## 2014-02-13 DIAGNOSIS — I4891 Unspecified atrial fibrillation: Secondary | ICD-10-CM | POA: Diagnosis not present

## 2014-02-13 DIAGNOSIS — I359 Nonrheumatic aortic valve disorder, unspecified: Secondary | ICD-10-CM | POA: Diagnosis not present

## 2014-02-15 DIAGNOSIS — I10 Essential (primary) hypertension: Secondary | ICD-10-CM | POA: Diagnosis not present

## 2014-02-15 DIAGNOSIS — IMO0001 Reserved for inherently not codable concepts without codable children: Secondary | ICD-10-CM | POA: Diagnosis not present

## 2014-02-15 DIAGNOSIS — I359 Nonrheumatic aortic valve disorder, unspecified: Secondary | ICD-10-CM | POA: Diagnosis not present

## 2014-02-15 DIAGNOSIS — I4891 Unspecified atrial fibrillation: Secondary | ICD-10-CM | POA: Diagnosis not present

## 2014-02-15 DIAGNOSIS — R262 Difficulty in walking, not elsewhere classified: Secondary | ICD-10-CM | POA: Diagnosis not present

## 2014-02-21 DIAGNOSIS — I4891 Unspecified atrial fibrillation: Secondary | ICD-10-CM | POA: Diagnosis not present

## 2014-02-21 DIAGNOSIS — I359 Nonrheumatic aortic valve disorder, unspecified: Secondary | ICD-10-CM | POA: Diagnosis not present

## 2014-02-21 DIAGNOSIS — R262 Difficulty in walking, not elsewhere classified: Secondary | ICD-10-CM | POA: Diagnosis not present

## 2014-02-21 DIAGNOSIS — I10 Essential (primary) hypertension: Secondary | ICD-10-CM | POA: Diagnosis not present

## 2014-02-21 DIAGNOSIS — IMO0001 Reserved for inherently not codable concepts without codable children: Secondary | ICD-10-CM | POA: Diagnosis not present

## 2014-02-22 ENCOUNTER — Encounter: Payer: Self-pay | Admitting: Interventional Cardiology

## 2014-02-23 DIAGNOSIS — I359 Nonrheumatic aortic valve disorder, unspecified: Secondary | ICD-10-CM | POA: Diagnosis not present

## 2014-02-23 DIAGNOSIS — IMO0001 Reserved for inherently not codable concepts without codable children: Secondary | ICD-10-CM | POA: Diagnosis not present

## 2014-02-23 DIAGNOSIS — I4891 Unspecified atrial fibrillation: Secondary | ICD-10-CM | POA: Diagnosis not present

## 2014-02-23 DIAGNOSIS — R262 Difficulty in walking, not elsewhere classified: Secondary | ICD-10-CM | POA: Diagnosis not present

## 2014-02-23 DIAGNOSIS — I10 Essential (primary) hypertension: Secondary | ICD-10-CM | POA: Diagnosis not present

## 2014-02-27 DIAGNOSIS — I10 Essential (primary) hypertension: Secondary | ICD-10-CM | POA: Diagnosis not present

## 2014-02-27 DIAGNOSIS — IMO0001 Reserved for inherently not codable concepts without codable children: Secondary | ICD-10-CM | POA: Diagnosis not present

## 2014-02-27 DIAGNOSIS — R262 Difficulty in walking, not elsewhere classified: Secondary | ICD-10-CM | POA: Diagnosis not present

## 2014-02-27 DIAGNOSIS — I359 Nonrheumatic aortic valve disorder, unspecified: Secondary | ICD-10-CM | POA: Diagnosis not present

## 2014-02-27 DIAGNOSIS — I4891 Unspecified atrial fibrillation: Secondary | ICD-10-CM | POA: Diagnosis not present

## 2014-03-01 DIAGNOSIS — R262 Difficulty in walking, not elsewhere classified: Secondary | ICD-10-CM | POA: Diagnosis not present

## 2014-03-01 DIAGNOSIS — I4891 Unspecified atrial fibrillation: Secondary | ICD-10-CM | POA: Diagnosis not present

## 2014-03-01 DIAGNOSIS — IMO0001 Reserved for inherently not codable concepts without codable children: Secondary | ICD-10-CM | POA: Diagnosis not present

## 2014-03-01 DIAGNOSIS — I10 Essential (primary) hypertension: Secondary | ICD-10-CM | POA: Diagnosis not present

## 2014-03-01 DIAGNOSIS — I359 Nonrheumatic aortic valve disorder, unspecified: Secondary | ICD-10-CM | POA: Diagnosis not present

## 2014-03-02 DIAGNOSIS — I10 Essential (primary) hypertension: Secondary | ICD-10-CM | POA: Diagnosis not present

## 2014-03-02 DIAGNOSIS — E039 Hypothyroidism, unspecified: Secondary | ICD-10-CM | POA: Diagnosis not present

## 2014-03-02 DIAGNOSIS — Z23 Encounter for immunization: Secondary | ICD-10-CM | POA: Diagnosis not present

## 2014-03-02 DIAGNOSIS — E7800 Pure hypercholesterolemia, unspecified: Secondary | ICD-10-CM | POA: Diagnosis not present

## 2014-03-02 DIAGNOSIS — Z0000 Encounter for general adult medical examination without abnormal findings: Secondary | ICD-10-CM | POA: Diagnosis not present

## 2014-03-02 DIAGNOSIS — N289 Disorder of kidney and ureter, unspecified: Secondary | ICD-10-CM | POA: Diagnosis not present

## 2014-03-02 DIAGNOSIS — Z79899 Other long term (current) drug therapy: Secondary | ICD-10-CM | POA: Diagnosis not present

## 2014-03-06 DIAGNOSIS — I10 Essential (primary) hypertension: Secondary | ICD-10-CM | POA: Diagnosis not present

## 2014-03-06 DIAGNOSIS — IMO0001 Reserved for inherently not codable concepts without codable children: Secondary | ICD-10-CM | POA: Diagnosis not present

## 2014-03-06 DIAGNOSIS — I359 Nonrheumatic aortic valve disorder, unspecified: Secondary | ICD-10-CM | POA: Diagnosis not present

## 2014-03-06 DIAGNOSIS — R262 Difficulty in walking, not elsewhere classified: Secondary | ICD-10-CM | POA: Diagnosis not present

## 2014-03-06 DIAGNOSIS — I4891 Unspecified atrial fibrillation: Secondary | ICD-10-CM | POA: Diagnosis not present

## 2014-03-08 DIAGNOSIS — I4891 Unspecified atrial fibrillation: Secondary | ICD-10-CM | POA: Diagnosis not present

## 2014-03-08 DIAGNOSIS — IMO0001 Reserved for inherently not codable concepts without codable children: Secondary | ICD-10-CM | POA: Diagnosis not present

## 2014-03-08 DIAGNOSIS — K5900 Constipation, unspecified: Secondary | ICD-10-CM | POA: Diagnosis not present

## 2014-03-08 DIAGNOSIS — R32 Unspecified urinary incontinence: Secondary | ICD-10-CM | POA: Diagnosis not present

## 2014-03-08 DIAGNOSIS — R262 Difficulty in walking, not elsewhere classified: Secondary | ICD-10-CM | POA: Diagnosis not present

## 2014-03-08 DIAGNOSIS — R209 Unspecified disturbances of skin sensation: Secondary | ICD-10-CM | POA: Diagnosis not present

## 2014-03-08 DIAGNOSIS — I10 Essential (primary) hypertension: Secondary | ICD-10-CM | POA: Diagnosis not present

## 2014-03-08 DIAGNOSIS — N390 Urinary tract infection, site not specified: Secondary | ICD-10-CM | POA: Diagnosis not present

## 2014-03-08 DIAGNOSIS — E039 Hypothyroidism, unspecified: Secondary | ICD-10-CM | POA: Diagnosis not present

## 2014-03-08 DIAGNOSIS — I359 Nonrheumatic aortic valve disorder, unspecified: Secondary | ICD-10-CM | POA: Diagnosis not present

## 2014-03-13 DIAGNOSIS — I4891 Unspecified atrial fibrillation: Secondary | ICD-10-CM | POA: Diagnosis not present

## 2014-03-13 DIAGNOSIS — IMO0001 Reserved for inherently not codable concepts without codable children: Secondary | ICD-10-CM | POA: Diagnosis not present

## 2014-03-13 DIAGNOSIS — R262 Difficulty in walking, not elsewhere classified: Secondary | ICD-10-CM | POA: Diagnosis not present

## 2014-03-13 DIAGNOSIS — I10 Essential (primary) hypertension: Secondary | ICD-10-CM | POA: Diagnosis not present

## 2014-03-13 DIAGNOSIS — I359 Nonrheumatic aortic valve disorder, unspecified: Secondary | ICD-10-CM | POA: Diagnosis not present

## 2014-03-27 DIAGNOSIS — Z1212 Encounter for screening for malignant neoplasm of rectum: Secondary | ICD-10-CM | POA: Diagnosis not present

## 2014-04-19 DIAGNOSIS — K5900 Constipation, unspecified: Secondary | ICD-10-CM | POA: Diagnosis not present

## 2014-04-19 DIAGNOSIS — Z1212 Encounter for screening for malignant neoplasm of rectum: Secondary | ICD-10-CM | POA: Diagnosis not present

## 2014-04-19 DIAGNOSIS — N390 Urinary tract infection, site not specified: Secondary | ICD-10-CM | POA: Diagnosis not present

## 2014-04-19 DIAGNOSIS — Z01419 Encounter for gynecological examination (general) (routine) without abnormal findings: Secondary | ICD-10-CM | POA: Diagnosis not present

## 2014-04-19 DIAGNOSIS — M810 Age-related osteoporosis without current pathological fracture: Secondary | ICD-10-CM | POA: Diagnosis not present

## 2014-04-24 ENCOUNTER — Ambulatory Visit (INDEPENDENT_AMBULATORY_CARE_PROVIDER_SITE_OTHER): Payer: Medicare Other | Admitting: Cardiology

## 2014-04-24 ENCOUNTER — Encounter: Payer: Self-pay | Admitting: Cardiology

## 2014-04-24 VITALS — BP 128/72 | HR 55 | Ht 60.5 in | Wt 183.4 lb

## 2014-04-24 DIAGNOSIS — I4821 Permanent atrial fibrillation: Secondary | ICD-10-CM

## 2014-04-24 DIAGNOSIS — I10 Essential (primary) hypertension: Secondary | ICD-10-CM | POA: Diagnosis not present

## 2014-04-24 DIAGNOSIS — R600 Localized edema: Secondary | ICD-10-CM

## 2014-04-24 DIAGNOSIS — I350 Nonrheumatic aortic (valve) stenosis: Secondary | ICD-10-CM

## 2014-04-24 DIAGNOSIS — R609 Edema, unspecified: Secondary | ICD-10-CM | POA: Diagnosis not present

## 2014-04-24 DIAGNOSIS — Z7901 Long term (current) use of anticoagulants: Secondary | ICD-10-CM

## 2014-04-24 DIAGNOSIS — I4891 Unspecified atrial fibrillation: Secondary | ICD-10-CM

## 2014-04-24 DIAGNOSIS — E785 Hyperlipidemia, unspecified: Secondary | ICD-10-CM

## 2014-04-24 DIAGNOSIS — I359 Nonrheumatic aortic valve disorder, unspecified: Secondary | ICD-10-CM | POA: Diagnosis not present

## 2014-04-24 DIAGNOSIS — E669 Obesity, unspecified: Secondary | ICD-10-CM

## 2014-04-24 NOTE — Patient Instructions (Signed)
No change in medications  Your physician wants you to follow-up in March 2016 Dr Herbie Baltimore.  You will receive a reminder letter in the mail two months in advance. If you don't receive a letter, please call our office to schedule the follow-up appointment

## 2014-04-26 ENCOUNTER — Encounter: Payer: Self-pay | Admitting: Cardiology

## 2014-04-26 DIAGNOSIS — E669 Obesity, unspecified: Secondary | ICD-10-CM | POA: Insufficient documentation

## 2014-04-26 NOTE — Progress Notes (Signed)
Clinic Note:  Patient ID: Makayla Thomas, female   DOB: 06-25-1926, 80 y.o.   MRN: [id number] PCP: Londell Moh, MD  CHIEF COMPLAINT:  Chief Complaint  Patient presents with  . 16 MONTH VISIT    NO CHEST PAIN , NO SOB , ANKLE  EDEMA   HPI: Makayla Thomas is a 80 y.o. female with a PMH below who presents today for followup of her chronic atrial fibrillation, hypertension, chronic lower extremity edema and PAD.She comments on having been in the hospital recently. She says for the last January but I see that she was in the hospital in May for a fractured patella in May. She describes that she woke up one day and was just extremely weak and could not move herself. She had been noticing some swelling in her left knee off and on she then presented on May 20 with significant redness and discomfort and swelling in the knee she is unable stand and bear weight. She was seen by Dr. Charlann Boxer and had x-rays performed showing a patellar fracture that was not acute having been noted one year earlier. She was admitted for pain control and physical therapy.  It was concern of possible soft tissue bleeding or chronic anticoagulation. She was discharged after 3 days and was restarted on Xarelto.   Interval History: She states she's feeling  pretty good, and has no major complaints. The main point she actually commented about was that she is no longer driving. She gave up her keys and gave up her driver's license. This was a mutual decision with her family but she still try to get used to the idea of having given up her independence. Cardiac standpoint she seems relatively stable. She denies any sensation of palpitations or syncope/near-syncope. She does not notice that she is in chronic atrial fibrillation. No heart  failure symptoms of edema, PND or orthopnea. No chest tightness or pressure or shortness of breath with rest exertion. She stable on Xarelto with no melena, hematochezia, hematuria or nosebleeds Or  further bleeding in the knee.. Denies any TIA or amaurosis fugax symptoms. Despite her PAD, she really did not complain of any outpatient symptoms.  Past Medical History  Diagnosis Date  . Permanent atrial fibrillation   . Chronic anticoagulation     on Xarelto  . H/O: CVA (cerebrovascular accident)   . Hypothyroidism   . Dyslipidemia     treated  . PAD (peripheral artery disease) 08/30/2009    Dopplers; with bil. post. tib artery occlusion  . Lower extremity edema     chronic  . Aortic stenosis, mild 08/2009    ECHO  EF 55-60%, wth increased EDP, mild   . H/O cardiovascular stress test 05/2010    Lexiscan cardiolite no ischemia or infarction  . History of ETT 02/2011    Naughton exercise protocol, negative with poor exercise tolerance  . H/O multiple sclerosis     no excaerbations in some time   ALLERGIES & MEDICATIONS REVIEWED IN EPIC -- NO CHANGES   SOCIAL & FAMILY HISTORY REVIEWED IN EPIC - NOTABLE CHANGE: She is no longer driving, did not renew her license.  ROS: A comprehensive Review of Systems - was performed Review of Systems  Constitutional:       Recent hospitalization for profound weakness (she said January, but no hospital records found) Regaining her strength   HENT: Negative for nosebleeds.   Eyes: Negative for blurred vision and double vision.  Cardiovascular:  Per HPI  Gastrointestinal: Negative for constipation, blood in stool and melena.  Genitourinary: Negative for hematuria and flank pain.  Musculoskeletal: Positive for joint pain.       Pain in the left knee is better with less bruising  Neurological: Negative for dizziness, sensory change, speech change, focal weakness, seizures, loss of consciousness and headaches.  Endo/Heme/Allergies: Bruises/bleeds easily.  Psychiatric/Behavioral: Positive for depression. The patient is not nervous/anxious.   All other systems reviewed and are negative.  PHYSICAL EXAM BP 128/72  Pulse 55  Ht 5' 0.5"  (1.537 m)  Wt 183 lb 6.4 oz (83.19 kg)  BMI 35.21 kg/m2 General appearance: alert, cooperative, appears stated age, no distress, mildly obese and Very pleasant mood and affect Neck: no adenopathy, no carotid bruit, no JVD, supple, symmetrical, trachea midline, thyroid not enlarged, symmetric, no tenderness/mass/nodules and HEENT: Wears glasses, [place]/AT, MMM, anicteric sclera Lungs: clear to auscultation bilaterally, normal percussion bilaterally and Nonlabored Heart: normal apical impulse, irregularly irregular rhythm, no S3 or S4, no rub and Controlled rate Abdomen: soft, non-tender; bowel sounds normal; no masses,  no organomegaly and Mildly obese Extremities: edema Trace to 1+, stable Pulses: Reduced PT pulses bilaterally.  [id number] today: Yes Rate: 55, Rhythm: Atrial fibrillation with controlled/ slow ventricular rate;  otherwise normal at ECG with no significant changes.  ASSESSMENT: Remains relatively stable.  PLAN: Permanent atrial fibrillation Rate well controlled with low-dose calcium channel blocker plus beta blocker. No sensation of being in A. Fib. Endocrine related with low-dose Xarelto with no recurrent episodes of bleeding. Certainly if there are further concerns of intra-articular bleed or other types of the incidents, he would be fine and safe for her to stop Xarelto temporarily.  Dyslipidemia, goal LDL below 100 - due aortic stenosis Continues to be on statin followed by PCP.  Aortic stenosis, mild With no active symptoms, I generally don't think that we need to check an echocardiogram until next year and only if the murmur sounds worse. The likelihood of her going from mild stenosis is severe stenosis in a short time is relatively low.  Essential hypertension -well controlled Blood pressure looks better today no longer on a low 60. Her Ziac is now with a 6.25 mg dose of HCTZ . This is also help her cramping  Lower extremity edema  Stable on low-dose Lasix with  urine  Obesity (BMI 30-39.9) Try to stay active and monitor her diet. She does have limited mobility with her knee pain she also has less lead to control her diet with her being a resident at [facility].   No orders of the defined types were placed in this encounter.   Orders Placed This Encounter  Procedures  . EKG 12-Lead    Followup: 6 months  HARDING,DAVID W, M.D., M.S. Interventional Cardiologist   Pager # [phone]

## 2014-04-26 NOTE — Assessment & Plan Note (Signed)
Stable on low-dose Lasix with urine

## 2014-04-26 NOTE — Assessment & Plan Note (Signed)
Blood pressure looks better today no longer on a low 60. Her Ziac is now with a 6.25 mg dose of HCTZ . This is also help her cramping

## 2014-04-26 NOTE — Assessment & Plan Note (Signed)
Try to stay active and monitor her diet. She does have limited mobility with her knee pain she also has less lead to control her diet with her being a resident at [facility].

## 2014-04-26 NOTE — Assessment & Plan Note (Signed)
Continues to be on statin followed by PCP.

## 2014-04-26 NOTE — Assessment & Plan Note (Signed)
With no active symptoms, I generally don't think that we need to check an echocardiogram until next year and only if the murmur sounds worse. The likelihood of her going from mild stenosis is severe stenosis in a short time is relatively low.

## 2014-04-26 NOTE — Assessment & Plan Note (Signed)
Rate well controlled with low-dose calcium channel blocker plus beta blocker. No sensation of being in A. Fib. Endocrine related with low-dose Xarelto with no recurrent episodes of bleeding. Certainly if there are further concerns of intra-articular bleed or other types of the incidents, he would be fine and safe for her to stop Xarelto temporarily.

## 2014-05-02 DIAGNOSIS — Z23 Encounter for immunization: Secondary | ICD-10-CM | POA: Diagnosis not present

## 2014-05-03 ENCOUNTER — Other Ambulatory Visit: Payer: Self-pay | Admitting: Cardiology

## 2014-05-04 NOTE — Telephone Encounter (Signed)
Rx was sent to pharmacy electronically. 

## 2014-05-08 ENCOUNTER — Other Ambulatory Visit: Payer: Self-pay | Admitting: *Deleted

## 2014-05-09 ENCOUNTER — Other Ambulatory Visit: Payer: Self-pay | Admitting: Cardiology

## 2014-06-28 DIAGNOSIS — R110 Nausea: Secondary | ICD-10-CM | POA: Diagnosis not present

## 2014-06-28 DIAGNOSIS — E039 Hypothyroidism, unspecified: Secondary | ICD-10-CM | POA: Diagnosis not present

## 2014-06-28 DIAGNOSIS — R531 Weakness: Secondary | ICD-10-CM | POA: Diagnosis not present

## 2014-06-28 DIAGNOSIS — R197 Diarrhea, unspecified: Secondary | ICD-10-CM | POA: Diagnosis not present

## 2014-07-04 DIAGNOSIS — R197 Diarrhea, unspecified: Secondary | ICD-10-CM | POA: Diagnosis not present

## 2014-07-04 DIAGNOSIS — R140 Abdominal distension (gaseous): Secondary | ICD-10-CM | POA: Diagnosis not present

## 2014-07-30 DIAGNOSIS — R739 Hyperglycemia, unspecified: Secondary | ICD-10-CM | POA: Diagnosis not present

## 2014-07-31 DIAGNOSIS — E1129 Type 2 diabetes mellitus with other diabetic kidney complication: Secondary | ICD-10-CM | POA: Diagnosis not present

## 2014-08-07 ENCOUNTER — Other Ambulatory Visit: Payer: Self-pay | Admitting: Cardiology

## 2014-08-07 NOTE — Telephone Encounter (Signed)
Rx refill denied to patient pharmacy   

## 2014-08-13 ENCOUNTER — Other Ambulatory Visit: Payer: Self-pay | Admitting: Cardiology

## 2014-08-13 NOTE — Telephone Encounter (Signed)
Rx refill denied to patient pharmacy  Rx was sent in 04/2014

## 2014-08-15 ENCOUNTER — Telehealth: Payer: Self-pay | Admitting: Cardiology

## 2014-08-15 MED ORDER — DILTIAZEM HCL ER COATED BEADS 120 MG PO CP24: 120.0000 mg | ORAL_CAPSULE | Freq: Every day | ORAL | Status: DC

## 2014-08-15 NOTE — Telephone Encounter (Signed)
Pt called last week and still have not received her Cartia XT. Please call it in today to [phone].

## 2014-08-15 NOTE — Telephone Encounter (Signed)
Clarified dose and frequency for cardizem CD w/ patient based on last prescribed. Renewed script. Called pt to notify, she voiced understanding.

## 2014-08-18 DIAGNOSIS — Z8673 Personal history of transient ischemic attack (TIA), and cerebral infarction without residual deficits: Secondary | ICD-10-CM | POA: Insufficient documentation

## 2014-08-27 DIAGNOSIS — R112 Nausea with vomiting, unspecified: Secondary | ICD-10-CM | POA: Diagnosis not present

## 2014-08-27 DIAGNOSIS — R1013 Epigastric pain: Secondary | ICD-10-CM | POA: Diagnosis not present

## 2014-08-29 ENCOUNTER — Encounter (HOSPITAL_COMMUNITY): Payer: Self-pay | Admitting: Emergency Medicine

## 2014-08-29 ENCOUNTER — Emergency Department (HOSPITAL_COMMUNITY): Payer: Medicare Other

## 2014-08-29 ENCOUNTER — Inpatient Hospital Stay (HOSPITAL_COMMUNITY)
Admission: EM | Admit: 2014-08-29 | Discharge: 2014-09-03 | DRG: 069 | Disposition: A | Discharge status: Home Health | Payer: Medicare Other | Attending: Internal Medicine | Admitting: Internal Medicine

## 2014-08-29 DIAGNOSIS — Z7901 Long term (current) use of anticoagulants: Secondary | ICD-10-CM | POA: Diagnosis not present

## 2014-08-29 DIAGNOSIS — E039 Hypothyroidism, unspecified: Secondary | ICD-10-CM | POA: Diagnosis present

## 2014-08-29 DIAGNOSIS — Z888 Allergy status to other drugs, medicaments and biological substances status: Secondary | ICD-10-CM

## 2014-08-29 DIAGNOSIS — Z887 Allergy status to serum and vaccine status: Secondary | ICD-10-CM | POA: Diagnosis not present

## 2014-08-29 DIAGNOSIS — K625 Hemorrhage of anus and rectum: Secondary | ICD-10-CM | POA: Diagnosis not present

## 2014-08-29 DIAGNOSIS — I059 Rheumatic mitral valve disease, unspecified: Secondary | ICD-10-CM | POA: Diagnosis not present

## 2014-08-29 DIAGNOSIS — D72829 Elevated white blood cell count, unspecified: Secondary | ICD-10-CM | POA: Diagnosis present

## 2014-08-29 DIAGNOSIS — G458 Other transient cerebral ischemic attacks and related syndromes: Secondary | ICD-10-CM | POA: Diagnosis not present

## 2014-08-29 DIAGNOSIS — I4821 Permanent atrial fibrillation: Secondary | ICD-10-CM | POA: Diagnosis present

## 2014-08-29 DIAGNOSIS — R1110 Vomiting, unspecified: Secondary | ICD-10-CM | POA: Diagnosis not present

## 2014-08-29 DIAGNOSIS — I482 Chronic atrial fibrillation, unspecified: Secondary | ICD-10-CM | POA: Insufficient documentation

## 2014-08-29 DIAGNOSIS — N3000 Acute cystitis without hematuria: Secondary | ICD-10-CM | POA: Diagnosis not present

## 2014-08-29 DIAGNOSIS — I350 Nonrheumatic aortic (valve) stenosis: Secondary | ICD-10-CM | POA: Diagnosis present

## 2014-08-29 DIAGNOSIS — Z79899 Other long term (current) drug therapy: Secondary | ICD-10-CM

## 2014-08-29 DIAGNOSIS — E860 Dehydration: Secondary | ICD-10-CM | POA: Diagnosis present

## 2014-08-29 DIAGNOSIS — R600 Localized edema: Secondary | ICD-10-CM | POA: Diagnosis present

## 2014-08-29 DIAGNOSIS — N179 Acute kidney failure, unspecified: Secondary | ICD-10-CM

## 2014-08-29 DIAGNOSIS — N390 Urinary tract infection, site not specified: Secondary | ICD-10-CM | POA: Diagnosis present

## 2014-08-29 DIAGNOSIS — R4781 Slurred speech: Secondary | ICD-10-CM | POA: Diagnosis present

## 2014-08-29 DIAGNOSIS — R197 Diarrhea, unspecified: Secondary | ICD-10-CM | POA: Diagnosis present

## 2014-08-29 DIAGNOSIS — A000 Cholera due to Vibrio cholerae 01, biovar cholerae: Secondary | ICD-10-CM | POA: Diagnosis not present

## 2014-08-29 DIAGNOSIS — I4891 Unspecified atrial fibrillation: Secondary | ICD-10-CM | POA: Diagnosis not present

## 2014-08-29 DIAGNOSIS — B9689 Other specified bacterial agents as the cause of diseases classified elsewhere: Secondary | ICD-10-CM | POA: Diagnosis present

## 2014-08-29 DIAGNOSIS — I10 Essential (primary) hypertension: Secondary | ICD-10-CM | POA: Diagnosis present

## 2014-08-29 DIAGNOSIS — Z8249 Family history of ischemic heart disease and other diseases of the circulatory system: Secondary | ICD-10-CM | POA: Diagnosis not present

## 2014-08-29 DIAGNOSIS — R112 Nausea with vomiting, unspecified: Secondary | ICD-10-CM | POA: Diagnosis present

## 2014-08-29 DIAGNOSIS — R404 Transient alteration of awareness: Secondary | ICD-10-CM | POA: Diagnosis not present

## 2014-08-29 DIAGNOSIS — Z8673 Personal history of transient ischemic attack (TIA), and cerebral infarction without residual deficits: Secondary | ICD-10-CM

## 2014-08-29 DIAGNOSIS — Z9049 Acquired absence of other specified parts of digestive tract: Secondary | ICD-10-CM | POA: Diagnosis present

## 2014-08-29 DIAGNOSIS — E876 Hypokalemia: Secondary | ICD-10-CM

## 2014-08-29 DIAGNOSIS — K644 Residual hemorrhoidal skin tags: Secondary | ICD-10-CM | POA: Diagnosis present

## 2014-08-29 DIAGNOSIS — I639 Cerebral infarction, unspecified: Secondary | ICD-10-CM | POA: Diagnosis not present

## 2014-08-29 DIAGNOSIS — G35 Multiple sclerosis: Secondary | ICD-10-CM | POA: Diagnosis present

## 2014-08-29 DIAGNOSIS — R531 Weakness: Secondary | ICD-10-CM | POA: Diagnosis not present

## 2014-08-29 DIAGNOSIS — R9401 Abnormal electroencephalogram [EEG]: Secondary | ICD-10-CM | POA: Diagnosis not present

## 2014-08-29 DIAGNOSIS — I739 Peripheral vascular disease, unspecified: Secondary | ICD-10-CM | POA: Diagnosis present

## 2014-08-29 DIAGNOSIS — E785 Hyperlipidemia, unspecified: Secondary | ICD-10-CM | POA: Diagnosis present

## 2014-08-29 DIAGNOSIS — Z823 Family history of stroke: Secondary | ICD-10-CM | POA: Diagnosis not present

## 2014-08-29 DIAGNOSIS — G459 Transient cerebral ischemic attack, unspecified: Secondary | ICD-10-CM | POA: Diagnosis present

## 2014-08-29 DIAGNOSIS — R110 Nausea: Secondary | ICD-10-CM | POA: Diagnosis not present

## 2014-08-29 DIAGNOSIS — R479 Unspecified speech disturbances: Secondary | ICD-10-CM | POA: Diagnosis not present

## 2014-08-29 LAB — CBC WITH DIFFERENTIAL/PLATELET
BASOS PCT: 1 % (ref 0–1)
Basophils Absolute: 0.1 10*3/uL (ref 0.0–0.1)
EOS ABS: 0.1 10*3/uL (ref 0.0–0.7)
Eosinophils Relative: 1 % (ref 0–5)
HCT: 46.9 % — ABNORMAL HIGH (ref 36.0–46.0)
Hemoglobin: 16.1 g/dL — ABNORMAL HIGH (ref 12.0–15.0)
Lymphocytes Relative: 24 % (ref 12–46)
Lymphs Abs: 2.7 10*3/uL (ref 0.7–4.0)
MCH: 30.9 pg (ref 26.0–34.0)
MCHC: 34.3 g/dL (ref 30.0–36.0)
MCV: 90 fL (ref 78.0–100.0)
Monocytes Absolute: 0.8 10*3/uL (ref 0.1–1.0)
Monocytes Relative: 8 % (ref 3–12)
NEUTROS ABS: 7.4 10*3/uL (ref 1.7–7.7)
NEUTROS PCT: 66 % (ref 43–77)
Platelets: 310 10*3/uL (ref 150–400)
RBC: 5.21 MIL/uL — ABNORMAL HIGH (ref 3.87–5.11)
RDW: 13.9 % (ref 11.5–15.5)
WBC: 11.1 10*3/uL — ABNORMAL HIGH (ref 4.0–10.5)

## 2014-08-29 LAB — COMPREHENSIVE METABOLIC PANEL
ALBUMIN: 4.7 g/dL (ref 3.5–5.2)
ALT: 92 U/L — AB (ref 0–35)
AST: 133 U/L — AB (ref 0–37)
Alkaline Phosphatase: 104 U/L (ref 39–117)
Anion gap: 14 (ref 5–15)
BUN: 77 mg/dL — ABNORMAL HIGH (ref 6–23)
CHLORIDE: 95 meq/L — AB (ref 96–112)
CO2: 28 mmol/L (ref 19–32)
Calcium: 9.8 mg/dL (ref 8.4–10.5)
Creatinine, Ser: 2.04 mg/dL — ABNORMAL HIGH (ref 0.50–1.10)
GFR calc Af Amer: 24 mL/min — ABNORMAL LOW (ref >90)
GFR, EST NON AFRICAN AMERICAN: 21 mL/min — AB (ref >90)
Glucose, Bld: 132 mg/dL — ABNORMAL HIGH (ref 70–99)
POTASSIUM: 2.6 mmol/L — AB (ref 3.5–5.1)
Sodium: 137 mmol/L (ref 135–145)
Total Bilirubin: 1.2 mg/dL (ref 0.3–1.2)
Total Protein: 7.8 g/dL (ref 6.0–8.3)

## 2014-08-29 LAB — URINALYSIS, ROUTINE W REFLEX MICROSCOPIC
Bilirubin Urine: NEGATIVE
Glucose, UA: NEGATIVE mg/dL
Hgb urine dipstick: NEGATIVE
KETONES UR: NEGATIVE mg/dL
NITRITE: NEGATIVE
PH: 5 (ref 5.0–8.0)
PROTEIN: NEGATIVE mg/dL
Specific Gravity, Urine: 1.011 (ref 1.005–1.030)
Urobilinogen, UA: 0.2 mg/dL (ref 0.0–1.0)

## 2014-08-29 LAB — MAGNESIUM: Magnesium: 2.3 mg/dL (ref 1.5–2.5)

## 2014-08-29 LAB — URINE MICROSCOPIC-ADD ON

## 2014-08-29 LAB — TROPONIN I: Troponin I: <0.03 ng/mL (ref <0.031)

## 2014-08-29 LAB — POC OCCULT BLOOD, ED: FECAL OCCULT BLD: NEGATIVE

## 2014-08-29 LAB — LIPASE, BLOOD: Lipase: 29 U/L (ref 11–59)

## 2014-08-29 MED ORDER — SODIUM CHLORIDE 0.9 % IV SOLN
INTRAVENOUS | Status: DC
  Administered 2014-08-29: 14:00:00 via INTRAVENOUS

## 2014-08-29 MED ORDER — PRAVASTATIN SODIUM 20 MG PO TABS
20.0000 mg | ORAL_TABLET | Freq: Every day | ORAL | Status: DC
  Administered 2014-08-30 – 2014-09-02 (×4): 20 mg via ORAL
  Filled 2014-08-29 (×5): qty 1

## 2014-08-29 MED ORDER — POTASSIUM CHLORIDE 10 MEQ/100ML IV SOLN
10.0000 meq | Freq: Once | INTRAVENOUS | Status: AC
  Administered 2014-08-29: 10 meq via INTRAVENOUS
  Filled 2014-08-29: qty 100

## 2014-08-29 MED ORDER — ACETAMINOPHEN 650 MG RE SUPP: 650.0000 mg | Freq: Four times a day (QID) | RECTAL | Status: DC | PRN

## 2014-08-29 MED ORDER — LEVOTHYROXINE SODIUM 125 MCG PO TABS
125.0000 ug | ORAL_TABLET | Freq: Every day | ORAL | Status: DC
  Administered 2014-08-30 – 2014-09-03 (×5): 125 ug via ORAL
  Filled 2014-08-29 (×5): qty 1

## 2014-08-29 MED ORDER — ACETAMINOPHEN 325 MG PO TABS: 650.0000 mg | ORAL_TABLET | Freq: Four times a day (QID) | ORAL | Status: DC | PRN

## 2014-08-29 MED ORDER — RIVAROXABAN 15 MG PO TABS
15.0000 mg | ORAL_TABLET | Freq: Every day | ORAL | Status: DC
  Administered 2014-08-29 – 2014-09-02 (×5): 15 mg via ORAL
  Filled 2014-08-29 (×6): qty 1

## 2014-08-29 MED ORDER — ONDANSETRON HCL 4 MG PO TABS: 4.0000 mg | ORAL_TABLET | Freq: Four times a day (QID) | ORAL | Status: DC | PRN

## 2014-08-29 MED ORDER — SODIUM CHLORIDE 0.9 % IV SOLN
INTRAVENOUS | Status: DC
  Administered 2014-08-29 – 2014-09-01 (×5): via INTRAVENOUS
  Filled 2014-08-29 (×7): qty 1000

## 2014-08-29 MED ORDER — SODIUM CHLORIDE 0.9 % IV SOLN
INTRAVENOUS | Status: DC
  Administered 2014-08-29: 16:00:00 via INTRAVENOUS

## 2014-08-29 MED ORDER — DILTIAZEM HCL ER COATED BEADS 120 MG PO CP24
120.0000 mg | ORAL_CAPSULE | Freq: Every day | ORAL | Status: DC
  Administered 2014-08-29 – 2014-09-02 (×5): 120 mg via ORAL
  Filled 2014-08-29 (×5): qty 1

## 2014-08-29 MED ORDER — VITAMIN D3 25 MCG (1000 UNIT) PO TABS
1000.0000 [IU] | ORAL_TABLET | Freq: Every day | ORAL | Status: DC
  Administered 2014-08-29 – 2014-09-03 (×6): 1000 [IU] via ORAL
  Filled 2014-08-29 (×6): qty 1

## 2014-08-29 MED ORDER — ENSURE COMPLETE PO LIQD
237.0000 mL | Freq: Two times a day (BID) | ORAL | Status: DC
  Administered 2014-08-30: 237 mL via ORAL

## 2014-08-29 MED ORDER — ONDANSETRON HCL 4 MG/2ML IJ SOLN: 4.0000 mg | Freq: Four times a day (QID) | INTRAMUSCULAR | Status: DC | PRN

## 2014-08-29 NOTE — ED Notes (Signed)
Dr. Carolee Rota N.P., [place], calls prior to her arrival to tell us this pr. Has just completed treatment for H. Pylori and is now having n/v/d.  He further tells [place] pt. Has K+ of 2.9.  They will phone EMS for transport.

## 2014-08-29 NOTE — ED Notes (Signed)
Alerted MD to K+ of 2.6

## 2014-08-29 NOTE — H&P (Addendum)
[facility] History and Physical  Makayla Thomas ONG:[id number] DOB: 12/13/1925 DOA: 08/29/2014  Referring physician: ER physician PCP: Londell Moh, MD   Chief Complaint: nausea, vomiting, diarrhea   HPI:  81 year old female with past medical history of hypertension, dyslipidemia, atrial fibrillation on anticoagulation with xarelto, hypothyroidism who presented to [facility] ED with ongoing diarrhea, nausea and vomiting for past few days prior to this admission. She went to see PCP and was told her potassium was low and she was sent to ED for further evaluation. Pt reported no abdominal pain, no blood in stool or emesis. No constipation. She recently had treatment for H.pylori and reported being on 4 medication which she believes caused her to have nausea, vomiting and diarrhea. No chest pain, shortness of breath or palpitations. No lightheadedness or loss of consciousness. In ED, BP was 99/84, HR 62, RR 20, afebrile. Blood work showed WBC count of 11.1, hemoglobin 16.1, potassium 2.6, BUN 77 and creatinine 2.04. Abdominal x ray showed no obstruction. She was admitted for further evaluation and management.   Assessment & Plan    Principal Problem: Dehydration / Nausea and vomiting / Diarrhea / Leukocytosis - unclear etiology; possible viral gastroenteritis - abd x ray showed no obstruction  - obtain stool for C.diff, GI pathogen panel   Active Problems: Permanent atrial fibrillation / Chronic anticoagulation - Rate controlled with Cardizem and metoprolol. Due to soft BP will only resume Cardizem and metoprolol in am if blood pressure ok. - AC with xarelto   Dyslipidemia, goal LDL below 100 - due aortic stenosis - continue statin therapy   Essential hypertension -well controlled - due to soft BP will hold metoprolol, Ziac and lasix. Only resumed Cardizem at bedtime  Hypokalemia - likely due to GI losses - supplemented through IV fluids   Acute renal failure - due to GI  losses, dehydration - continue IV fluids - follow up BMP in am  Hypothyroidism - continue synthroid - check TSH  DVT prophylaxis:  - on anticoagulation with xarelto   Radiological Exams on Admission: Dg Abd Acute W/chest 08/29/2014    No evidence of bowel obstruction or free air.  No acute cardiopulmonary disease.      Code Status: Full Family Communication: Plan of care discussed with the patient  Disposition Plan: Admit for further evaluation  Manson Passey, MD  [facility] Pager [phone]  Review of Systems:  Constitutional: Negative for fever, chills and malaise/fatigue. Negative for diaphoresis.  HENT: Negative for hearing loss, ear pain, nosebleeds, congestion, sore throat, neck pain, tinnitus and ear discharge.   Eyes: Negative for blurred vision, double vision, photophobia, pain, discharge and redness.  Respiratory: Negative for cough, hemoptysis, sputum production, shortness of breath, wheezing and stridor.   Cardiovascular: Negative for chest pain, palpitations, orthopnea, claudication and leg swelling.  Gastrointestinal: per HPI.  Genitourinary: Negative for dysuria, urgency, frequency, hematuria and flank pain.  Musculoskeletal: Negative for myalgias, back pain, joint pain and falls.  Skin: Negative for itching and rash.  Neurological: Negative for dizziness and weakness. Negative for tingling, tremors, sensory change, speech change, focal weakness, loss of consciousness and headaches.  Endo/Heme/Allergies: Negative for environmental allergies and polydipsia. Does not bruise/bleed easily.  Psychiatric/Behavioral: Negative for suicidal ideas. The patient is not nervous/anxious.      Past Medical History  Diagnosis Date  . Permanent atrial fibrillation   . Chronic anticoagulation     on Xarelto  . H/O: CVA (cerebrovascular accident)   . Hypothyroidism   .  Dyslipidemia     treated  . PAD (peripheral artery disease) 08/30/2009    Dopplers; with bil. post. tib  artery occlusion  . Lower extremity edema     chronic  . Aortic stenosis, mild 08/2009    ECHO  EF 55-60%, wth increased EDP, mild   . H/O cardiovascular stress test 05/2010    Lexiscan cardiolite no ischemia or infarction  . History of ETT 02/2011    Naughton exercise protocol, negative with poor exercise tolerance  . H/O multiple sclerosis     no excaerbations in some time   Past Surgical History  Procedure Laterality Date  . Cesarean section      hx of 3  . Throidectomy partial      lt  . Cholecystectomy    . Breast surgery      L tumor removed - benign   Social History:  reports that she has never smoked. She has never used smokeless tobacco. She reports that she drinks alcohol. She reports that she does not use illicit drugs.  Allergies  Allergen Reactions  . Tetanus Antitoxin Anaphylaxis    Throat swelling  . Meclizine Other (See Comments)    Had funny feeling    Family History:  Family History  Problem Relation Age of Onset  . Heart failure Father 22     Prior to Admission medications   Medication Sig Start Date End Date Taking? Authorizing Provider  acetaminophen (TYLENOL) 500 MG tablet Take 500 mg by mouth every 6 (six) hours as needed for pain.    Historical Provider, MD  bisoprolol-hydrochlorothiazide [facility]) 10-6.25 MG per tablet Take 1 tablet by mouth daily.    Historical Provider, MD  cholecalciferol (VITAMIN D) 1000 UNITS tablet Take 1,000 Units by mouth daily.    Historical Provider, MD  diltiazem (CARDIZEM CD) 120 MG 24 hr capsule Take 1 capsule (120 mg total) by mouth at bedtime. 08/15/14   Marykay Lex, MD  furosemide (LASIX) 20 MG tablet Take 20 mg by mouth 2 (two) times daily.  06/28/12   Historical Provider, MD  Pitavastatin Calcium (LIVALO) 1 MG TABS Take 1 mg by mouth daily.    Historical Provider, MD  SYNTHROID 125 MCG tablet Take 125 mcg by mouth daily. 04/13/12   Historical Provider, MD  XARELTO 15 MG TABS tablet Take 15 mg by mouth daily.   05/25/12   Historical Provider, MD   Physical Exam: Filed Vitals:   08/29/14 1137 08/29/14 1140 08/29/14 1351  BP:  117/78 99/84  Pulse:  62 77  Temp:  98.6 F (37 C)   TempSrc:  Oral   Resp:  20 20  SpO2: 98% 100% 97%    Physical Exam  Constitutional: Appears well-developed and well-nourished. No distress.  HENT: Normocephalic. No tonsillar erythema or exudates Eyes: Conjunctivae and EOM are normal. PERRLA, no scleral icterus.  Neck: Normal ROM. Neck supple. No JVD. No tracheal deviation. No thyromegaly.  CVS: RRR, S1/S2 +, no murmurs, no gallops, no carotid bruit.  Pulmonary: Effort and breath sounds normal, no stridor, rhonchi, wheezes, rales.  Abdominal: Soft. BS +,  no distension, tenderness, rebound or guarding.  Musculoskeletal: Normal range of motion. No edema and no tenderness.  Lymphadenopathy: No lymphadenopathy noted, cervical, inguinal. Neuro: Alert. Normal reflexes, muscle tone coordination. No focal neurologic deficits. Skin: Skin is warm and dry. No rash noted.  No erythema. No pallor.  Psychiatric: Normal mood and affect. Behavior, judgment, thought content normal.   Labs on Admission:  Basic Metabolic Panel:  Recent Labs Lab 08/29/14 1251  NA 137  K 2.6*  CL 95*  CO2 28  GLUCOSE 132*  BUN 77*  CREATININE 2.04*  CALCIUM 9.8   Liver Function Tests:  Recent Labs Lab 08/29/14 1251  AST 133*  ALT 92*  ALKPHOS 104  BILITOT 1.2  PROT 7.8  ALBUMIN 4.7    Recent Labs Lab 08/29/14 1251  LIPASE 29   No results for input(s): AMMONIA in the last 168 hours. CBC:  Recent Labs Lab 08/29/14 1251  WBC 11.1*  NEUTROABS 7.4  HGB 16.1*  HCT 46.9*  MCV 90.0  PLT 310   Cardiac Enzymes:  Recent Labs Lab 08/29/14 1251  TROPONINI <0.03   BNP: Invalid input(s): POCBNP CBG: No results for input(s): GLUCAP in the last 168 hours.  If 7PM-7AM, please contact night-coverage [email or web address] Password [place] 08/29/2014, 2:38 PM

## 2014-08-29 NOTE — ED Notes (Signed)
When pt went from sitting to standing she C/O of nausea. Pt also stated she had the feeling that she might pass out. When pt sat back on bed she stated she started feel better.

## 2014-08-29 NOTE — ED Notes (Signed)
Potassium 2.6 critical called from lab.

## 2014-08-29 NOTE — ED Notes (Signed)
Ambulated pt to restroom. Obtained clean catch urine, however pt was unable to get a viable stool specimen. Pt stated she would alert the nurse when she needed to go again

## 2014-08-29 NOTE — ED Notes (Signed)
Pt states receiving antibiotics for a questionable C-Diff. Brought in from doctors office for abnormal lab work. Began vomiting pills after taking antibiotics, and became very weak.

## 2014-08-29 NOTE — ED Provider Notes (Signed)
CSN: [id number]     Arrival date & time 08/29/14  1134 History   First MD Initiated Contact with Patient 08/29/14 1222     Chief Complaint  Patient presents with  . Weakness  . Nausea      HPI Pt was seen at 1305. Per pt, c/o gradual onset and persistence of multiple intermittent episodes of diarrhea that began 1 week ago. Describes the stools as "yellow" and "not formed." Pt states she has also had N/V for the past 2 to 3 days and been unable to tol PO. Pt was evaluated by her PMD for same today, told her "potassium was low" and "I was dehydrated." Pt was then instructed to come to the ED for further evaluation and admission. Pt states she was being treated by her PMD for "H. Pylori" and "now they're worried about cdiff."  Denies abd pain, no CP/SOB, no back pain, no fevers, no black or blood in stools or emesis.     Past Medical History  Diagnosis Date  . Permanent atrial fibrillation   . Chronic anticoagulation     on Xarelto  . H/O: CVA (cerebrovascular accident)   . Hypothyroidism   . Dyslipidemia     treated  . PAD (peripheral artery disease) 08/30/2009    Dopplers; with bil. post. tib artery occlusion  . Lower extremity edema     chronic  . Aortic stenosis, mild 08/2009    ECHO  EF 55-60%, wth increased EDP, mild   . H/O cardiovascular stress test 05/2010    Lexiscan cardiolite no ischemia or infarction  . History of ETT 02/2011    Naughton exercise protocol, negative with poor exercise tolerance  . H/O multiple sclerosis     no excaerbations in some time   Past Surgical History  Procedure Laterality Date  . Cesarean section      hx of 3  . Throidectomy partial      lt  . Cholecystectomy    . Breast surgery      L tumor removed - benign   Family History  Problem Relation Age of Onset  . Heart failure Father 41   History  Substance Use Topics  . Smoking status: Never Smoker   . Smokeless tobacco: Never Used  . Alcohol Use: Yes     Comment: 1 glass every  other week at the most    Review of Systems ROS: Statement: All systems negative except as marked or noted in the HPI; Constitutional: Negative for fever and chills. ; ; Eyes: Negative for eye pain, redness and discharge. ; ; ENMT: Negative for ear pain, hoarseness, nasal congestion, sinus pressure and sore throat. ; ; Cardiovascular: Negative for chest pain, palpitations, diaphoresis, dyspnea and peripheral edema. ; ; Respiratory: Negative for cough, wheezing and stridor. ; ; Gastrointestinal: +N/V/D. Negative for abdominal pain, blood in stool, hematemesis, jaundice and rectal bleeding. . ; ; Genitourinary: Negative for dysuria, flank pain and hematuria. ; ; Musculoskeletal: Negative for back pain and neck pain. Negative for swelling and trauma.; ; Skin: Negative for pruritus, rash, abrasions, blisters, bruising and skin lesion.; ; Neuro: Negative for headache, lightheadedness and neck stiffness. Negative for weakness, altered level of consciousness , altered mental status, extremity weakness, paresthesias, involuntary movement, seizure and syncope.      Allergies  Tetanus antitoxin and Meclizine  Home Medications   Prior to Admission medications   Medication Sig Start Date End Date Taking? Authorizing Provider  acetaminophen (TYLENOL) 500 MG tablet Take  500 mg by mouth every 6 (six) hours as needed for pain.    Historical Provider, MD  bisoprolol-hydrochlorothiazide [facility]) 10-6.25 MG per tablet Take 1 tablet by mouth daily.    Historical Provider, MD  cholecalciferol (VITAMIN D) 1000 UNITS tablet Take 1,000 Units by mouth daily.    Historical Provider, MD  diltiazem (CARDIZEM CD) 120 MG 24 hr capsule Take 1 capsule (120 mg total) by mouth at bedtime. 08/15/14   Marykay Lex, MD  furosemide (LASIX) 20 MG tablet Take 20 mg by mouth 2 (two) times daily.  06/28/12   Historical Provider, MD  Pitavastatin Calcium (LIVALO) 1 MG TABS Take 1 mg by mouth daily.    Historical Provider, MD  SYNTHROID  125 MCG tablet Take 125 mcg by mouth daily. 04/13/12   Historical Provider, MD  XARELTO 15 MG TABS tablet Take 15 mg by mouth daily.  05/25/12   Historical Provider, MD   BP 99/84 mmHg  Pulse 77  Temp(Src) 98.6 F (37 C) (Oral)  Resp 20  SpO2 97%   13:04 Orthostatic Vital Signs AC  Orthostatic Lying  - BP- Lying: 95/63 mmHg ; Pulse- Lying: 69  Orthostatic Sitting - BP- Sitting: 86/66 mmHg ; Pulse- Sitting: 76  Orthostatic Standing at 0 minutes - BP- Standing at 0 minutes: 98/80 mmHg ; Pulse- Standing at 0 minutes: 74   Physical Exam  1310: Physical examination:  Nursing notes reviewed; Vital signs and O2 SAT reviewed;  Constitutional: Well developed, Well nourished, In no acute distress; Head:  Normocephalic, atraumatic; Eyes: EOMI, PERRL, No scleral icterus; ENMT: Mouth and pharynx normal, Mucous membranes dry; Neck: Supple, Full range of motion, No lymphadenopathy; Cardiovascular: Regular rate and rhythm, No gallop; Respiratory: Breath sounds clear & equal bilaterally, No wheezes.  Speaking full sentences with ease, Normal respiratory effort/excursion; Chest: Nontender, Movement normal; Abdomen: Soft, Nontender, Nondistended, Normal bowel sounds; Genitourinary: No CVA tenderness; Extremities: Pulses normal, Pelvis stable. No tenderness, No edema, No calf edema or asymmetry.; Neuro: AA&Ox3, Major CN grossly intact.  Speech clear. No gross focal motor or sensory deficits in extremities.; Skin: Color normal, Warm, Dry.   ED Course  Procedures    EKG Interpretation None      MDM  MDM Reviewed: previous chart, nursing note and vitals Reviewed previous: labs Interpretation: labs and x-ray      Results for orders placed or performed during the hospital encounter of 08/29/14  Urinalysis, Routine w reflex microscopic  Result Value Ref Range   Color, Urine YELLOW YELLOW   APPearance CLOUDY (A) CLEAR   Specific Gravity, Urine 1.011 1.005 - 1.030   pH 5.0 5.0 - 8.0   Glucose, UA  NEGATIVE NEGATIVE mg/dL   Hgb urine dipstick NEGATIVE NEGATIVE   Bilirubin Urine NEGATIVE NEGATIVE   Ketones, ur NEGATIVE NEGATIVE mg/dL   Protein, ur NEGATIVE NEGATIVE mg/dL   Urobilinogen, UA 0.2 0.0 - 1.0 mg/dL   Nitrite NEGATIVE NEGATIVE   Leukocytes, UA LARGE (A) NEGATIVE  Comprehensive metabolic panel  Result Value Ref Range   Sodium 137 135 - 145 mmol/L   Potassium 2.6 (LL) 3.5 - 5.1 mmol/L   Chloride 95 (L) 96 - 112 mEq/L   CO2 28 19 - 32 mmol/L   Glucose, Bld 132 (H) 70 - 99 mg/dL   BUN 77 (H) 6 - 23 mg/dL   Creatinine, Ser 1.61 (H) 0.50 - 1.10 mg/dL   Calcium 9.8 8.4 - 09.6 mg/dL   Total Protein 7.8 6.0 - 8.3 g/dL  Albumin 4.7 3.5 - 5.2 g/dL   AST 962 (H) 0 - 37 U/L   ALT 92 (H) 0 - 35 U/L   Alkaline Phosphatase 104 39 - 117 U/L   Total Bilirubin 1.2 0.3 - 1.2 mg/dL   GFR calc non Af Amer 21 (L) >90 mL/min   GFR calc Af Amer 24 (L) >90 mL/min   Anion gap 14 5 - 15  Lipase, blood  Result Value Ref Range   Lipase 29 11 - 59 U/L  Troponin I  Result Value Ref Range   Troponin I <0.03 <0.031 ng/mL  CBC with Differential  Result Value Ref Range   WBC 11.1 (H) 4.0 - 10.5 K/uL   RBC 5.21 (H) 3.87 - 5.11 MIL/uL   Hemoglobin 16.1 (H) 12.0 - 15.0 g/dL   HCT 95.2 (H) 84.1 - 32.4 %   MCV 90.0 78.0 - 100.0 fL   MCH 30.9 26.0 - 34.0 pg   MCHC 34.3 30.0 - 36.0 g/dL   RDW 40.1 02.7 - 25.3 %   Platelets 310 150 - 400 K/uL   Neutrophils Relative % 66 43 - 77 %   Neutro Abs 7.4 1.7 - 7.7 K/uL   Lymphocytes Relative 24 12 - 46 %   Lymphs Abs 2.7 0.7 - 4.0 K/uL   Monocytes Relative 8 3 - 12 %   Monocytes Absolute 0.8 0.1 - 1.0 K/uL   Eosinophils Relative 1 0 - 5 %   Eosinophils Absolute 0.1 0.0 - 0.7 K/uL   Basophils Relative 1 0 - 1 %   Basophils Absolute 0.1 0.0 - 0.1 K/uL  Urine microscopic-add on  Result Value Ref Range   Squamous Epithelial / LPF MANY (A) RARE   WBC, UA 11-20 <3 WBC/hpf   RBC / HPF 0-2 <3 RBC/hpf   Bacteria, UA MANY (A) RARE  POC occult blood, ED   Result Value Ref Range   Fecal Occult Bld NEGATIVE NEGATIVE   Dg Abd Acute W/chest 08/29/2014   CLINICAL DATA:  Vomiting, weakness. Nausea and diarrhea. Recent scratch head recent treatment for C difficile.  EXAM: ACUTE ABDOMEN SERIES (ABDOMEN 2 VIEW & CHEST 1 VIEW)  COMPARISON:  08/30/2009  FINDINGS: Heart is borderline in size.  Lungs are clear.  No effusions.  Nonobstructive bowel gas pattern. No free air. Prior cholecystectomy. No organomegaly or suspicious calcification. Vascular calcifications noted. No acute bony abnormality. Degenerative changes in the thoracolumbar spine and hips, right greater than left.  IMPRESSION: No evidence of bowel obstruction or free air.  No acute cardiopulmonary disease.   Electronically Signed   By: Charlett Nose M.D.   On: 08/29/2014 14:23    1420:  Pt orthostatic during VS. Judicious IVF given with IV potassium. No clear UTI on Udip; UC is pending. Mild LFT elevation, but abd remains benign, and pt is already s/p cholecystectomy. Pt unable to provide stool sample while in the ED. Dx and testing d/w pt.  Questions answered.  Verb understanding, agreeable to admit.  T/C to [facility] Dr. Elisabeth Pigeon, case discussed, including:  HPI, pertinent PM/SHx, VS/PE, dx testing, ED course and treatment:  Agreeable to admit, requests to write temporary orders, obtain tele bed to team [facility].   Samuel Jester, DO 09/01/14 6644

## 2014-08-30 DIAGNOSIS — E785 Hyperlipidemia, unspecified: Secondary | ICD-10-CM

## 2014-08-30 DIAGNOSIS — I10 Essential (primary) hypertension: Secondary | ICD-10-CM

## 2014-08-30 LAB — COMPREHENSIVE METABOLIC PANEL
ALT: 121 U/L — ABNORMAL HIGH (ref 0–35)
ANION GAP: 13 (ref 5–15)
AST: 166 U/L — AB (ref 0–37)
Albumin: 4 g/dL (ref 3.5–5.2)
Alkaline Phosphatase: 96 U/L (ref 39–117)
BUN: 78 mg/dL — ABNORMAL HIGH (ref 6–23)
CO2: 30 mmol/L (ref 19–32)
CREATININE: 1.95 mg/dL — AB (ref 0.50–1.10)
Calcium: 9.4 mg/dL (ref 8.4–10.5)
Chloride: 100 mEq/L (ref 96–112)
GFR calc Af Amer: 25 mL/min — ABNORMAL LOW (ref >90)
GFR calc non Af Amer: 22 mL/min — ABNORMAL LOW (ref >90)
Glucose, Bld: 131 mg/dL — ABNORMAL HIGH (ref 70–99)
Potassium: 2.6 mmol/L — CL (ref 3.5–5.1)
Sodium: 143 mmol/L (ref 135–145)
TOTAL PROTEIN: 6.6 g/dL (ref 6.0–8.3)
Total Bilirubin: 1.3 mg/dL — ABNORMAL HIGH (ref 0.3–1.2)

## 2014-08-30 LAB — CBC
HCT: 43.9 % (ref 36.0–46.0)
Hemoglobin: 14.5 g/dL (ref 12.0–15.0)
MCH: 30 pg (ref 26.0–34.0)
MCHC: 33 g/dL (ref 30.0–36.0)
MCV: 90.9 fL (ref 78.0–100.0)
Platelets: 272 10*3/uL (ref 150–400)
RBC: 4.83 MIL/uL (ref 3.87–5.11)
RDW: 13.9 % (ref 11.5–15.5)
WBC: 9.9 10*3/uL (ref 4.0–10.5)

## 2014-08-30 LAB — CLOSTRIDIUM DIFFICILE BY PCR: Toxigenic C. Difficile by PCR: NEGATIVE

## 2014-08-30 LAB — GLUCOSE, CAPILLARY: Glucose-Capillary: 113 mg/dL — ABNORMAL HIGH (ref 70–99)

## 2014-08-30 LAB — TSH: TSH: 0.123 u[IU]/mL — AB (ref 0.350–4.500)

## 2014-08-30 MED ORDER — POTASSIUM CHLORIDE CRYS ER 20 MEQ PO TBCR: 40.0000 meq | EXTENDED_RELEASE_TABLET | Freq: Two times a day (BID) | ORAL | Status: DC

## 2014-08-30 MED ORDER — POTASSIUM CHLORIDE CRYS ER 20 MEQ PO TBCR
40.0000 meq | EXTENDED_RELEASE_TABLET | Freq: Once | ORAL | Status: AC
  Administered 2014-08-30: 40 meq via ORAL
  Filled 2014-08-30: qty 2

## 2014-08-30 MED ORDER — CEFTRIAXONE SODIUM IN DEXTROSE 20 MG/ML IV SOLN
1.0000 g | INTRAVENOUS | Status: DC
  Administered 2014-08-30: 1 g via INTRAVENOUS
  Filled 2014-08-30 (×2): qty 50

## 2014-08-30 MED ORDER — ENSURE COMPLETE PO LIQD
237.0000 mL | ORAL | Status: DC
  Administered 2014-08-31 – 2014-09-03 (×4): 237 mL via ORAL

## 2014-08-30 MED ORDER — POTASSIUM CHLORIDE 10 MEQ/100ML IV SOLN
10.0000 meq | INTRAVENOUS | Status: DC
  Administered 2014-08-30 (×4): 10 meq via INTRAVENOUS
  Filled 2014-08-30 (×3): qty 100

## 2014-08-30 NOTE — Progress Notes (Signed)
CRITICAL VALUE ALERT  Critical value received:  Potassium 2.6  Date of notification: 08/30/14  Time of notification:  0440  Critical value read back:Yes.    Nurse who received alert:  Linward Natal, RN  MD notified (1st page):  Tama Gander, NP  Time of first page:  [phone]  MD notified (2nd page):  Time of second page:  Responding MD:  Schorr  Time MD responded: [phone]

## 2014-08-30 NOTE — Progress Notes (Signed)
INITIAL NUTRITION ASSESSMENT  DOCUMENTATION CODES Per approved criteria  -Obesity Unspecified   INTERVENTION: -Recommend one Ensure Complete daily -Provided information on "Added Sugars" per pt request -RD to continue to monitor  NUTRITION DIAGNOSIS: Unintentional wt loss related to inadequate oral intake/acute illness as evidenced by 10 lb wt loss in one month  Goal: Pt to meet >/= 90% of their estimated nutrition needs    Monitor:  Total protein/energy intake, labs, weights, GI profile, education needs  Reason for Assessment: MST  81 y.o. female  Admitting Dx: Dehydration  ASSESSMENT: 81 year old female with past medical history of hypertension, dyslipidemia, atrial fibrillation on anticoagulation with xarelto, hypothyroidism who presented to [facility] ED with ongoing diarrhea, nausea and vomiting for past few days prior to this admission  -Pt reported weight loss of 20 lbs since 07/2014; however previous medical records indicate weight loss may be closer to 10 lbs  -Denied any changes in appetite, pt consumes three meal daily. Resides at ALF, who is responsible for preparing meals -Drinks Ensure 5-6x months, usually for meal replacement only when needed -Has had ongoing loose stools, C.diff pending -Pt with questions regarding added sugars in diet to assist in controlling CBG. Provided pt with nutrition education handout from [employer] -Has Ensure ordered BID, will order once daily d/t hx of unintentional wt loss -Currently eating well, > 75% of meals -K low, being repleted  Height: Ht Readings from Last 1 Encounters:  08/29/14  (1.549 m)    Weight: Wt Readings from Last 1 Encounters:  08/29/14 174 lb 13.2 oz (79.3 kg)    Ideal Body Weight: 105lb  % Ideal Body Weight: 165%  Wt Readings from Last 10 Encounters:  08/29/14 174 lb 13.2 oz (79.3 kg)  04/24/14 183 lb 6.4 oz (83.19 kg)  01/03/14 188 lb 7.9 oz (85.5 kg)  07/18/13 184 lb (83.462 kg)   01/10/13 188 lb (85.276 kg)  07/11/12 184 lb (83.462 kg)    Usual Body Weight: ~190 lb  % Usual Body Weight: 92%  BMI:  Body mass index is 33.05 kg/(m^2).  Estimated Nutritional Needs: Kcal: 1550-1750 Protein: 80-90 gram Fluid: >/=1800 ml daily  Skin: WDL  Diet Order: Diet regular  EDUCATION NEEDS: -Education needs addressed   Intake/Output Summary (Last 24 hours) at 08/30/14 1145 Last data filed at 08/30/14 0900  Gross per 24 hour  Intake 1982.5 ml  Output   1050 ml  Net  932.5 ml    Last BM: 1/13   Labs:   Recent Labs Lab 08/29/14 1251 08/29/14 1528 08/30/14 0336  NA 137  --  143  K 2.6*  --  2.6*  CL 95*  --  100  CO2 28  --  30  BUN 77*  --  78*  CREATININE 2.04*  --  1.95*  CALCIUM 9.8  --  9.4  MG  --  2.3  --   GLUCOSE 132*  --  131*    CBG (last 3)   Recent Labs  08/30/14 0739  GLUCAP 113*    Scheduled Meds: . cholecalciferol  1,000 Units Oral Daily  . diltiazem  120 mg Oral QHS  . [START ON 08/31/2014] feeding supplement (ENSURE COMPLETE)  237 mL Oral Q24H  . levothyroxine  125 mcg Oral QAC breakfast  . pravastatin  20 mg Oral q1800  . Rivaroxaban  15 mg Oral Q supper    Continuous Infusions: . sodium chloride 0.9 % 1,000 mL with potassium chloride 40  mEq infusion 75 mL/hr at 08/29/14 7939    Past Medical History  Diagnosis Date  . Permanent atrial fibrillation   . Chronic anticoagulation     on Xarelto  . H/O: CVA (cerebrovascular accident)   . Hypothyroidism   . Dyslipidemia     treated  . PAD (peripheral artery disease) 08/30/2009    Dopplers; with bil. post. tib artery occlusion  . Lower extremity edema     chronic  . Aortic stenosis, mild 08/2009    ECHO  EF 55-60%, wth increased EDP, mild   . H/O cardiovascular stress test 05/2010    Lexiscan cardiolite no ischemia or infarction  . History of ETT 02/2011    Naughton exercise protocol, negative with poor exercise tolerance  . H/O multiple sclerosis     no  excaerbations in some time    Past Surgical History  Procedure Laterality Date  . Cesarean section      hx of 3  . Throidectomy partial      lt  . Cholecystectomy    . Breast surgery      L tumor removed - benign    Lloyd Huger MS RD LDN Clinical Dietitian Pager:[phone]

## 2014-08-30 NOTE — Progress Notes (Signed)
Patient ID: Makayla Thomas, female   DOB: 1925-11-01, 81 y.o.   MRN: [id number] [facility] PROGRESS NOTE  Makayla Thomas WUJ:[id number] DOB: 03-03-1926 DOA: 08/29/2014 PCP: Londell Moh, MD  Brief narrative: 81 year old female with past medical history of hypertension, dyslipidemia, atrial fibrillation on anticoagulation with xarelto, hypothyroidism who presented to [facility] ED with ongoing diarrhea, nausea and vomiting for past few days prior to this admission. She recently had treatment for H.pylori and reported being on 4 medication which she believes caused her to have nausea, vomiting and diarrhea. Of note, she was seen by PCP prior to this admission and was told her potassium was low and was advised to come to ED fort further evaluation.   In ED, BP was 99/84, HR 62, RR 20, afebrile. Blood work showed WBC count of 11.1, hemoglobin 16.1, potassium 2.6, BUN 77 and creatinine 2.04. Abdominal x ray showed no obstruction. She was admitted for further evaluation and management.   Assessment and Plan:    Principal Problem: Dehydration / Nausea and vomiting / Diarrhea / Leukocytosis - unclear etiology; possible viral gastroenteritis. - no acute findings on abdominal x ray. - C. diff negative, GI pathogen panel is pending. - provide supportive care with IV fluids, analgesia and antiemetics as needed - WBC count resolved to normal    Active Problems: Permanent atrial fibrillation / Chronic anticoagulation - Rate controlled with Cardizem and metoprolol. Due to soft BP we only started Cardizem. Metoprolol on hold.  - AC with xarelto  Urinary tract infection - gram negative rods on urine culture - started rocephin - follow up final result and adjust abx accordingly   Dyslipidemia, goal LDL below 100 - due aortic stenosis - continue statin therapy   Essential hypertension -well controlled - due to soft BP will continue to hold metoprolol, Ziac and lasix. Only on ardizem at  bedtime  Hypokalemia - likely due to GI losses - supplemented through IV fluids   Acute renal failure - due to GI losses, dehydration - continue IV fluids - creatinine is improving   Hypothyroidism - continue synthroid - TSH is low 0.123 so will decrease the synthroid dose  DVT prophylaxis:  - on anticoagulation with xarelto    Code Status: Full Family Communication: Plan of care discussed with the patient  Disposition Plan: home when stable    IV access:   Peripheral IV  Procedures and diagnostic studies;  Dg Abd Acute W/chest 08/29/2014 No evidence of bowel obstruction or free air. No acute cardiopulmonary disease.    Medical consultants:   None   Other consultants:   None   Anti-infectives:   Rocephin 08/30/2014 -->   Debbora Presto, MD  [facility] Pager [phone]  If 7PM-7AM, please contact night-coverage [email or web address] Password [email or web address] 08/30/2014, 4:10 PM   LOS: 1 day   HPI/Subjective: No events overnight.   Objective: Filed Vitals:   08/29/14 2056 08/29/14 2156 08/30/14 0434 08/30/14 1354  BP: 116/71  109/71 118/50  Pulse: 68 67 75 67  Temp: 97.5 F (36.4 C)  97.7 F (36.5 C) 97.8 F (36.6 C)  TempSrc: Oral  Oral Oral  Resp: Height:      Weight:      SpO2: 98%  98% 94%    Intake/Output Summary (Last 24 hours) at 08/30/14 1610 Last data filed at 08/30/14 1300  Gross per 24 hour  Intake 2342.5 ml  Output   1350 ml  Net  992.5 ml    Exam:  General:  Pt is alert, follows commands appropriately, not in acute distress  Cardiovascular: rate controleed, S1/S2 (+)  Respiratory: Clear to auscultation bilaterally, no wheezing, no crackles, no rhonchi  Abdomen: Soft, non tender, non distended, bowel sounds present, no guarding  Extremities: No edema, pulses DP and PT palpable bilaterally  Neuro: Grossly nonfocal  Data Reviewed: Basic Metabolic Panel:  Recent Labs Lab 08/29/14 1251 08/29/14 1528 08/30/14 0336  NA  137  --  143  K 2.6*  --  2.6*  CL 95*  --  100  CO2 28  --  30  GLUCOSE 132*  --  131*  BUN 77*  --  78*  CREATININE 2.04*  --  1.95*  CALCIUM 9.8  --  9.4  MG  --  2.3  --    Liver Function Tests:  Recent Labs Lab 08/29/14 1251 08/30/14 0336  AST 133* 166*  ALT 92* 121*  ALKPHOS 104 96  BILITOT 1.2 1.3*  PROT 7.8 6.6  ALBUMIN 4.7 4.0    Recent Labs Lab 08/29/14 1251  LIPASE 29   No results for input(s): AMMONIA in the last 168 hours. CBC:  Recent Labs Lab 08/29/14 1251 08/30/14 0336  WBC 11.1* 9.9  NEUTROABS 7.4  --   HGB 16.1* 14.5  HCT 46.9* 43.9  MCV 90.0 90.9  PLT 310 272   Cardiac Enzymes:  Recent Labs Lab 08/29/14 1251  TROPONINI <0.03   BNP: Invalid input(s): POCBNP CBG:  Recent Labs Lab 08/30/14 0739  GLUCAP 113*    Recent Results (from the past 240 hour(s))  Urine culture     Status: None (Preliminary result)   Collection Time: 08/29/14 12:42 PM  Result Value Ref Range Status   Specimen Description URINE, CLEAN CATCH  Final   Special Requests NONE  Final   Colony Count   Final    >=100,000 COLONIES/ML Performed at [facility]    Culture   Final    GRAM NEGATIVE RODS Performed at [facility]    Report Status PENDING  Incomplete  Clostridium Difficile by PCR     Status: None   Collection Time: 08/30/14 10:30 AM  Result Value Ref Range Status   C difficile by pcr NEGATIVE NEGATIVE Final    Comment: Performed at [facility]     Scheduled Meds: . cholecalciferol  1,000 Units Oral Daily  . diltiazem  120 mg Oral QHS  . [START ON 08/31/2014] feeding supplement (ENSURE COMPLETE)  237 mL Oral Q24H  . levothyroxine  125 mcg Oral QAC breakfast  . pravastatin  20 mg Oral q1800  . Rivaroxaban  15 mg Oral Q supper   Continuous Infusions: . sodium chloride 0.9 % 1,000 mL with potassium chloride 40 mEq infusion 75 mL/hr at 08/30/14 1433

## 2014-08-30 NOTE — Care Management Note (Addendum)
    Page 1 of 2   09/03/2014     1:21:32 PM CARE MANAGEMENT NOTE 09/03/2014  Patient:  Makayla Thomas, Makayla Thomas   Account Number:  [id number]  Date Initiated:  08/30/2014  Documentation initiated by:  Lanier Clam  Subjective/Objective Assessment:   81 y/o f admitted w/n/v/d,afib.     Action/Plan:   From indep [facility].   Anticipated DC Date:  09/03/2014   Anticipated DC Plan:  HOME W HOME HEALTH SERVICES      DC Planning Services  CM consult      Choice offered to / List presented to:  C-1 Patient        HH arranged  HH-2 PT  HH-3 OT      [facility] agency  [facility]   Status of service:  Completed, signed off Medicare Important Message given?  YES (If response is "NO", the following Medicare IM given date fields will be blank) Date Medicare IM given:  08/31/2014 Medicare IM given by:  Lanier Clam Date Additional Medicare IM given:  09/03/2014 Additional Medicare IM given by:  Lanier Clam  Discharge Disposition:  HOME [facility] SERVICES  Per UR Regulation:  Reviewed for med. necessity/level of care/duration of stay  If discussed at Long Length of Stay Meetings, dates discussed:    Comments:  09/03/14 Lanier Clam RN BSN NCM [phone] PT/OT-HHPT/OT. [place] chosen for [employer].Faxed HHC, face to face orders w/confirmation to [phone],tel#[phone]-Lisa.Patient already has a rw,even though Md ordered rw(informed [place] dme rep patient has 1 already,please do not bring 1 to rm).No further d/c needs.  08/31/14 Lanier Clam RN BSN NCM [phone] PT ordered, await recommendations.  08/30/14 Lanier Clam RN BSN NCM [phone] Monitor progress & d/c needs.

## 2014-08-31 ENCOUNTER — Inpatient Hospital Stay (HOSPITAL_COMMUNITY): Payer: Medicare Other

## 2014-08-31 ENCOUNTER — Inpatient Hospital Stay (HOSPITAL_COMMUNITY)
Admission: EM | Admit: 2014-08-31 | Discharge: 2014-08-31 | Disposition: A | Discharge status: Home / Self Care | Payer: Medicare Other | Source: Home / Self Care | Attending: Neurology | Admitting: Neurology

## 2014-08-31 DIAGNOSIS — G458 Other transient cerebral ischemic attacks and related syndromes: Secondary | ICD-10-CM

## 2014-08-31 DIAGNOSIS — N3000 Acute cystitis without hematuria: Secondary | ICD-10-CM

## 2014-08-31 DIAGNOSIS — G459 Transient cerebral ischemic attack, unspecified: Secondary | ICD-10-CM

## 2014-08-31 DIAGNOSIS — I482 Chronic atrial fibrillation: Secondary | ICD-10-CM

## 2014-08-31 DIAGNOSIS — R479 Unspecified speech disturbances: Secondary | ICD-10-CM

## 2014-08-31 DIAGNOSIS — I639 Cerebral infarction, unspecified: Secondary | ICD-10-CM | POA: Insufficient documentation

## 2014-08-31 LAB — BASIC METABOLIC PANEL
Anion gap: 8 (ref 5–15)
BUN: 55 mg/dL — ABNORMAL HIGH (ref 6–23)
CO2: 25 mmol/L (ref 19–32)
Calcium: 8.8 mg/dL (ref 8.4–10.5)
Chloride: 109 mEq/L (ref 96–112)
Creatinine, Ser: 1.15 mg/dL — ABNORMAL HIGH (ref 0.50–1.10)
GFR calc non Af Amer: 42 mL/min — ABNORMAL LOW (ref >90)
GFR, EST AFRICAN AMERICAN: 48 mL/min — AB (ref >90)
Glucose, Bld: 117 mg/dL — ABNORMAL HIGH (ref 70–99)
Potassium: 4.2 mmol/L (ref 3.5–5.1)
Sodium: 142 mmol/L (ref 135–145)

## 2014-08-31 LAB — URINE CULTURE

## 2014-08-31 LAB — CBC
HCT: 43.1 % (ref 36.0–46.0)
HEMOGLOBIN: 14.4 g/dL (ref 12.0–15.0)
MCH: 30.6 pg (ref 26.0–34.0)
MCHC: 33.4 g/dL (ref 30.0–36.0)
MCV: 91.7 fL (ref 78.0–100.0)
Platelets: 224 10*3/uL (ref 150–400)
RBC: 4.7 MIL/uL (ref 3.87–5.11)
RDW: 14.3 % (ref 11.5–15.5)
WBC: 8.4 10*3/uL (ref 4.0–10.5)

## 2014-08-31 LAB — MAGNESIUM: Magnesium: 2.1 mg/dL (ref 1.5–2.5)

## 2014-08-31 LAB — GLUCOSE, CAPILLARY
Glucose-Capillary: 119 mg/dL — ABNORMAL HIGH (ref 70–99)
Glucose-Capillary: 111 mg/dL — ABNORMAL HIGH (ref 70–99)

## 2014-08-31 LAB — HEMOGLOBIN A1C
Hgb A1c MFr Bld: 6.4 % — ABNORMAL HIGH (ref <5.7)
Mean Plasma Glucose: 137 mg/dL — ABNORMAL HIGH (ref <117)

## 2014-08-31 MED ORDER — STROKE: EARLY STAGES OF RECOVERY BOOK
Freq: Once | Status: AC
  Administered 2014-08-31: 08:00:00
  Filled 2014-08-31: qty 1

## 2014-08-31 MED ORDER — CIPROFLOXACIN HCL 500 MG PO TABS
500.0000 mg | ORAL_TABLET | Freq: Two times a day (BID) | ORAL | Status: DC
  Administered 2014-08-31 – 2014-09-03 (×6): 500 mg via ORAL
  Filled 2014-08-31 (×7): qty 1

## 2014-08-31 NOTE — Progress Notes (Signed)
Bilateral carotid artery duplex completed:  1-39% ICA stenosis.  Vertebral artery flow is antegrade.     

## 2014-08-31 NOTE — Progress Notes (Signed)
Shift Event: Paged secondary to pt with slurred speech, c/o's of right sided weakness, BUE numbness, and difficulty finding words.  At bedside, pt was A & O X3, with word finding difficulty. BUE- decreased sensation; R upper and lower extremity- decreased strength.  Code stroke was initiated, and pt was evaluated by Neurology.  tPA not given due to rapidly improving symptoms. CT head with no acute abnormalities. Further recommendations for transfer to [facility] for full stroke workup.  Illa Level  [facility] [phone]

## 2014-08-31 NOTE — Progress Notes (Signed)
0300: Patient stated that she felt "weird" and was "frightened". Patient could not tell me where she was initially, but was reoriented easily. Patient with slowed and slurred speech, weak bilateral hand grip, and pt unable to move right leg. Pupils equal, reactive, and brisk. Patient stated she has never felt this way before. Patient complaining of numbness in fingertips of both hands.  Vitals signs stable. NP on call notified.  Rapid response at bedside. Will make patient NPO at this time until further assessed.   [id number] Li, PA at bedside to assess patient. Camila Li called Code Stroke. Code stroke initiated. Amada Jupiter, neurologist placed order for STAT CT. Order carried out. Neurologist arrived at bedside to assess patient. Vitals stable.   0600: Orders placed by DR. Kirkpatrick. Unable to carry out these orders on the floor. Dr. Amada Jupiter notified. Per Neuro MD to call [facility] for transfer order. Camila Li PA paged. Order placed for transfer to [facility]. Awaiting bed lacement at [facility]. Patient close to baseline, but states "I'm not where I use to be." See flowsheet for neuro checks. Vitals stable. Patient hand grip is stronger, speech is clear, patient able to move BLE. Patiient and son informed of transfer. Will pass this information to oncoming nurse.

## 2014-08-31 NOTE — Consult Note (Signed)
Neurology Consultation Reason for Consult: TIA Referring Physician: Code stroke, Danie Binder attending  CC: Word finding difficulty  History is obtained from: Patient  HPI: Makayla Thomas is a 81 y.o. female who was in her normal state of health earlier this evening and was seen shortly before code stroke was called around 3:45am and was in her normal state. She then noticed that she was having difficulty and knew something "just wasn't right" but she was unable to think of health call for help. She had significant word finding difficulty, but denies any weakness or numbness.   She has been admitted for generalized weakness, diarrhea, nausea, vomiting  LKW: 3:30 AM tpa given?: no, rapidly improving symptoms, anticoagulated with Xarelto    ROS: A 14 point ROS was performed and is negative except as noted in the HPI.   Past Medical History  Diagnosis Date  . Permanent atrial fibrillation   . Chronic anticoagulation     on Xarelto  . H/O: CVA (cerebrovascular accident)   . Hypothyroidism   . Dyslipidemia     treated  . PAD (peripheral artery disease) 08/30/2009    Dopplers; with bil. post. tib artery occlusion  . Lower extremity edema     chronic  . Aortic stenosis, mild 08/2009    ECHO  EF 55-60%, wth increased EDP, mild   . H/O cardiovascular stress test 05/2010    Lexiscan cardiolite no ischemia or infarction  . History of ETT 02/2011    [place] exercise protocol, negative with poor exercise tolerance  . H/O multiple sclerosis     no excaerbations in some time    Family History: Stroke  Social History: Tob: Denies  Exam: Current vital signs: BP 115/67 mmHg  Pulse 72  Temp(Src) 97.6 F (36.4 C) (Oral)  Resp 16  Ht 5\' 1"  (1.549 m)  Wt 79.3 kg (174 lb 13.2 oz)  BMI 33.05 kg/m2  SpO2 96% Vital signs in last 24 hours: Temp:  [97.3 F (36.3 C)-97.9 F (36.6 C)] 97.6 F (36.4 C) (01/15 0428) Pulse Rate:  [64-72] 72 (01/15 0428) Resp:  [16-18] 16 (01/15  0428) BP: (112-127)/(50-83) 115/67 mmHg (01/15 0428) SpO2:  [94 %-100 %] 96 % (01/15 0428)   Physical Exam  Constitutional: Appears well-developed and well-nourished.  Psych: Affect appropriate to situation Eyes: No scleral injection HENT: No OP obstrucion Head: Normocephalic.  Cardiovascular: Normal rate and regular rhythm.  Respiratory: Effort normal and breath sounds normal to anterior ascultation GI: Soft.  No distension. There is no tenderness.  Skin: WDI  Neuro: Mental Status: Patient is awake, alert, oriented to person,  month, year, and situation. Gives hospital as [name] Patient is able to give a clear and coherent history. No signs of  neglect she has very mild word finding difficulty, but each is fairly fluent. Cranial Nerves: II: Visual Fields are full. Pupils are equal, round, and reactive to light.   III,IV, VI: EOMI without ptosis or diploplia.  V: Facial sensation is symmetric to temperature VII: Facial movement is symmetric.  VIII: hearing is intact to voice X: Uvula elevates symmetrically XI: Shoulder shrug is symmetric. XII: tongue is midline without atrophy or fasciculations.  Motor: Tone is normal. Bulk is normal. 5/5 strength was present in all four extremities.  Sensory: Sensation is symmetric to light touch and temperature in the arms and legs. Deep Tendon Reflexes: 2+ and symmetric in the biceps and patellae.  Plantars: Toes are downgoing bilaterally.  Cerebellar: FNF intact bilaterally  I have reviewed labs in epic and the results pertinent to this consultation are: Mildly elevated creatinine  I have reviewed the images obtained: CT head-negative  Impression: 81 year old female with a history of atrial fibrillation who has had transient word finding difficulty and slurred speech. It is certainly possible that this represented TIA, but with confusion, will also order EEG given her age.  Recommendations: 1. HgbA1c, fasting  lipid panel 2. MRI, MRA  of the brain without contrast 3. Frequent neuro checks 4. Echocardiogram 5. Carotid dopplers 6. Prophylactic therapy-Xarelto 7. Risk factor modification 8. Telemetry monitoring 9. EEG   Ritta Slot, MD [facility] [phone]  If 7pm- 7am, please page neurology on call as listed in AMION.

## 2014-08-31 NOTE — Progress Notes (Addendum)
Patient ID: Makayla Thomas, female   DOB: 1926-04-19, 81 y.o.   MRN: [id number] [facility] PROGRESS NOTE  Jamal Haskin JYN:[id number] DOB: 1926-05-21 DOA: 08/29/2014 PCP: Londell Moh, MD  Brief narrative: 81 year old female with past medical history of hypertension, dyslipidemia, atrial fibrillation on anticoagulation with xarelto, hypothyroidism who presented to [facility] ED with ongoing diarrhea, nausea and vomiting for past few days prior to this admission. She recently had treatment for H.pylori and reported being on 4 medication which she believes caused her to have nausea, vomiting and diarrhea. Of note, she was seen by PCP prior to this admission and was told her potassium was low and was advised to come to ED fort further evaluation.   In ED, BP was 99/84, HR 62, RR 20, afebrile. Blood work showed WBC count of 11.1, hemoglobin 16.1, potassium 2.6, BUN 77 and creatinine 2.04. Abdominal x ray showed no obstruction. She was admitted for further evaluation and management.   Assessment and Plan:    Principal Problem: Suspected TIA -Patient have an episode of slurred speech this morning resolving over the course of the day. -Code CVA was called that she was evaluated by neurology. -Initial CT scan of brain did not show acute pathology, MRI of brain negative for acute CVA, carotid Dopplers unremarkable -Patient remains anticoagulated with Xarelto.  Dehydration / Nausea and vomiting / Diarrhea / Leukocytosis - unclear etiology; possible viral gastroenteritis. - no acute findings on abdominal x ray. - C. diff negative, GI pathogen panel is pending. -Will continue to encourage oral intake  Active Problems: Permanent atrial fibrillation / Chronic anticoagulation - Patient remains rate controlled with Cardizem, heart rates in the 60s to 70s - Continue anticoagulation with Xarelto  Urinary tract infection - gram negative rods on urine culture - Urine cultures growing  Enterobacter species, organism susceptible to fluoroquinolones -Will discontinue ceftriaxone, start ciprofloxacin.  Dyslipidemia, goal LDL below 100 - due aortic stenosis - continue statin therapy   Essential hypertension -well controlled - due to soft BP will continue to hold metoprolol, Ziac and lasix. Only on ardizem at bedtime  Hypokalemia - likely due to GI losses - supplemented through IV fluids   Acute renal failure - due to GI losses, dehydration - continue IV fluids - creatinine is improving   Hypothyroidism - continue synthroid - TSH is low 0.123 so will decrease the synthroid dose  DVT prophylaxis:  - on anticoagulation with xarelto    Code Status: Full Family Communication: Plan of care discussed with the patient  Disposition Plan: home when stable    IV access:   Peripheral IV  Procedures and diagnostic studies;  Dg Abd Acute W/chest 08/29/2014 No evidence of bowel obstruction or free air. No acute cardiopulmonary disease.    Medical consultants:   None   Other consultants:   None   Anti-infectives:   Rocephin 08/30/2014 -->   Jeralyn Bennett, MD  [facility] Pager [phone]  If 7PM-7AM, please contact night-coverage [email or web address] Password [email or web address] 08/31/2014, 2:27 PM   LOS: 2 days   HPI/Subjective: No events overnight.   Objective: Filed Vitals:   08/31/14 0712 08/31/14 0745 08/31/14 0907 08/31/14 1057  BP: 114/58 110/64  116/54  Pulse: 72 64  76  Temp: 98.4 F (36.9 C) 98.3 F (36.8 C) 98.8 F (37.1 C) 98.6 F (37 C)  TempSrc: Oral Oral  Oral  Resp: Height:      Weight:      SpO2: 96% 97%  98%  Intake/Output Summary (Last 24 hours) at 08/31/14 1427 Last data filed at 08/31/14 1300  Gross per 24 hour  Intake 2293.75 ml  Output    575 ml  Net 1718.75 ml    Exam:   General:  Pt is alert, follows commands appropriately, not in acute distress  Cardiovascular: rate controleed, S1/S2 (+)  Respiratory: Clear to  auscultation bilaterally, no wheezing, no crackles, no rhonchi  Abdomen: Soft, non tender, non distended, bowel sounds present, no guarding  Extremities: No edema, pulses DP and PT palpable bilaterally  Neuro: Grossly nonfocal  Data Reviewed: Basic Metabolic Panel:  Recent Labs Lab 08/29/14 1251 08/29/14 1528 08/30/14 0336 08/31/14 0404  NA 137  --  143 142  K 2.6*  --  2.6* 4.2  CL 95*  --  100 109  CO2 28  --  30 25  GLUCOSE 132*  --  131* 117*  BUN 77*  --  78* 55*  CREATININE 2.04*  --  1.95* 1.15*  CALCIUM 9.8  --  9.4 8.8  MG  --  2.3  --  2.1   Liver Function Tests:  Recent Labs Lab 08/29/14 1251 08/30/14 0336  AST 133* 166*  ALT 92* 121*  ALKPHOS 104 96  BILITOT 1.2 1.3*  PROT 7.8 6.6  ALBUMIN 4.7 4.0    Recent Labs Lab 08/29/14 1251  LIPASE 29   No results for input(s): AMMONIA in the last 168 hours. CBC:  Recent Labs Lab 08/29/14 1251 08/30/14 0336 08/31/14 0404  WBC 11.1* 9.9 8.4  NEUTROABS 7.4  --   --   HGB 16.1* 14.5 14.4  HCT 46.9* 43.9 43.1  MCV 90.0 90.9 91.7  PLT 310 272 224   Cardiac Enzymes:  Recent Labs Lab 08/29/14 1251  TROPONINI <0.03   BNP: Invalid input(s): POCBNP CBG:  Recent Labs Lab 08/30/14 0739 08/31/14 0322 08/31/14 0731  GLUCAP 113* 119* 111*    Recent Results (from the past 240 hour(s))  Urine culture     Status: None   Collection Time: 08/29/14 12:42 PM  Result Value Ref Range Status   Specimen Description URINE, CLEAN CATCH  Final   Special Requests NONE  Final   Colony Count   Final    >=100,000 COLONIES/ML Performed at [facility]    Culture   Final    ENTEROBACTER CLOACAE Performed at [facility]    Report Status 08/31/2014 FINAL  Final   Organism ID, Bacteria ENTEROBACTER CLOACAE  Final      Susceptibility   Enterobacter cloacae - MIC*    CEFAZOLIN >=64 RESISTANT Resistant     CEFTRIAXONE <=1 SENSITIVE Sensitive     CIPROFLOXACIN <=0.25 SENSITIVE Sensitive      GENTAMICIN <=1 SENSITIVE Sensitive     LEVOFLOXACIN <=0.12 SENSITIVE Sensitive     NITROFURANTOIN 32 SENSITIVE Sensitive     TOBRAMYCIN <=1 SENSITIVE Sensitive     TRIMETH/SULFA <=20 SENSITIVE Sensitive     PIP/TAZO <=4 SENSITIVE Sensitive     * ENTEROBACTER CLOACAE  Clostridium Difficile by PCR     Status: None   Collection Time: 08/30/14 10:30 AM  Result Value Ref Range Status   C difficile by pcr NEGATIVE NEGATIVE Final    Comment: Performed at [facility]     Scheduled Meds: . cefTRIAXone (ROCEPHIN)  IV  1 g Intravenous Q24H  . cholecalciferol  1,000 Units Oral Daily  . diltiazem  120 mg Oral QHS  . feeding supplement (ENSURE COMPLETE)  237 mL Oral Q24H  . levothyroxine  125 mcg Oral QAC breakfast  . pravastatin  20 mg Oral q1800  . Rivaroxaban  15 mg Oral Q supper   Continuous Infusions: . sodium chloride 0.9 % 1,000 mL with potassium chloride 40 mEq infusion 75 mL/hr at 08/31/14 0234      Time spent: 25 minutes

## 2014-08-31 NOTE — Progress Notes (Signed)
EEG Completed; Results Pending  

## 2014-08-31 NOTE — Procedures (Signed)
ELECTROENCEPHALOGRAM REPORT  Date of Study: 08/31/2014  Patient's Name: Makayla Thomas MRN: [id number] Date of Birth: 1926-01-15  Referring Provider: Dr. Ritta Slot  Clinical History: This is an 81 year old woman with significant word finding difficulty.  Medications: cholecalciferol (VITAMIN D) tablet 1,000 Units ciprofloxacin (CIPRO) tablet 500 mg diltiazem (CARDIZEM CD) 24 hr capsule 120 mg levothyroxine (SYNTHROID, LEVOTHROID) tablet 125 mcg pravastatin (PRAVACHOL) tablet 20 mg Rivaroxaban (XARELTO) tablet 15 mg  Technical Summary: A multichannel digital EEG recording measured by the international 10-20 system with electrodes applied with paste and impedances below 5000 ohms performed in our laboratory with EKG monitoring in an awake and asleep patient.  Hyperventilation and photic stimulation were not performed.  The digital EEG was referentially recorded, reformatted, and digitally filtered in a variety of bipolar and referential montages for optimal display.    Description: The patient is awake and asleep during the recording.  During maximal wakefulness, there is a symmetric, medium voltage 9.5-10 Hz posterior dominant rhythm that attenuates with eye opening.  There is occasional 2-3 Hz delta slowing seen independently over the bilateral temporal regions, left greater than right. Note of T4 electrode artifact.  During drowsiness and stage I sleep, there is an increase in theta slowing of the background with occasional vertex waves seen.  Hyperventilation and photic stimulation were not performed.  There were no epileptiform discharges or electrographic seizures seen.    EKG lead showed irregular rhythm.  Impression: This awake and asleep EEG is abnormal due to occasional focal slowing over the bilateral temporal regions, left greater than right.  Clinical Correlation of the above findings indicates focal cerebral dysfunction over the bilateral temporal regions,  suggestive of underlying structural or physiologic abnormality. The absence of epileptiform discharges does not exclude a clinical diagnosis of epilepsy. Clinical correlation is advised.   Patrcia Dolly, M.D.

## 2014-08-31 NOTE — Progress Notes (Signed)
PT Cancellation Note  Patient Details Name: Makayla Thomas MRN: [id number] DOB: [birth year]   Cancelled Treatment:    Reason Eval/Treat Not Completed: Medical issues which prohibited therapy (pt to transfer to [name])   [name],[name] 08/31/2014, 9:03 AM Zenovia Jarred, PT, DPT 08/31/2014 Pager: [phone]

## 2014-09-01 DIAGNOSIS — Z7901 Long term (current) use of anticoagulants: Secondary | ICD-10-CM

## 2014-09-01 DIAGNOSIS — E876 Hypokalemia: Secondary | ICD-10-CM

## 2014-09-01 DIAGNOSIS — I059 Rheumatic mitral valve disease, unspecified: Secondary | ICD-10-CM

## 2014-09-01 DIAGNOSIS — G459 Transient cerebral ischemic attack, unspecified: Principal | ICD-10-CM

## 2014-09-01 LAB — URINALYSIS, ROUTINE W REFLEX MICROSCOPIC
Bilirubin Urine: NEGATIVE
GLUCOSE, UA: NEGATIVE mg/dL
HGB URINE DIPSTICK: NEGATIVE
Ketones, ur: NEGATIVE mg/dL
Leukocytes, UA: NEGATIVE
NITRITE: NEGATIVE
PH: 5.5 (ref 5.0–8.0)
Protein, ur: NEGATIVE mg/dL
Specific Gravity, Urine: 1.019 (ref 1.005–1.030)
Urobilinogen, UA: 0.2 mg/dL (ref 0.0–1.0)

## 2014-09-01 LAB — LIPID PANEL
CHOL/HDL RATIO: 2.8 ratio
Cholesterol: 53 mg/dL (ref 0–200)
HDL: 19 mg/dL — AB (ref >39)
LDL CALC: 19 mg/dL (ref 0–99)
Triglycerides: 75 mg/dL (ref <150)
VLDL: 15 mg/dL (ref 0–40)

## 2014-09-01 LAB — GLUCOSE, CAPILLARY: Glucose-Capillary: 93 mg/dL (ref 70–99)

## 2014-09-01 NOTE — Progress Notes (Signed)
  Echocardiogram 2D Echocardiogram has been performed.  Makayla Thomas 09/01/2014, 11:18 AM

## 2014-09-01 NOTE — Progress Notes (Signed)
Patient ID: Brihany Butch, female   DOB: 09/05/1925, 81 y.o.   MRN: [id number] [facility] PROGRESS NOTE  Lemma Tetro WGN:[id number] DOB: Jan 01, 1926 DOA: 08/29/2014 PCP: Londell Moh, MD  Brief narrative: 81 year old female with past medical history of hypertension, dyslipidemia, atrial fibrillation on anticoagulation with xarelto, hypothyroidism who presented to [facility] ED with ongoing diarrhea, nausea and vomiting for past few days prior to this admission. She recently had treatment for H.pylori and reported being on 4 medication which she believes caused her to have nausea, vomiting and diarrhea. Of note, she was seen by PCP prior to this admission and was told her potassium was low and was advised to come to ED fort further evaluation.   In ED, BP was 99/84, HR 62, RR 20, afebrile. Blood work showed WBC count of 11.1, hemoglobin 16.1, potassium 2.6, BUN 77 and creatinine 2.04. Abdominal x ray showed no obstruction. She was admitted for further evaluation and management.   Assessment and Plan:    Principal Problem: Suspected TIA -Patient have an episode of slurred speech on 08/31/2014 resolving over the course of the day. -Code CVA was called that she was evaluated by neurology. -Initial CT scan of brain did not show acute pathology, MRI of brain negative for acute CVA, carotid Dopplers unremarkable -Patient remains anticoagulated with Xarelto. -Pt is stable from a neurologic standpoint on 09/01/2014. Echo still pending  Dehydration / Nausea and vomiting / Diarrhea / Leukocytosis - unclear etiology; possible viral gastroenteritis. -Stool for Cdiff negative -Tolerating Heart Healthy diet  Active Problems: Permanent atrial fibrillation / Chronic anticoagulation - Patient remains rate controlled with Cardizem, heart rates in the 60s to 70s - Continue anticoagulation with Xarelto  Urinary tract infection - gram negative rods on urine culture - Urine cultures growing  Enterobacter species, organism susceptible to fluoroquinolones -Discontinued ceftriaxone on 09/01/2014 -Tolerating cipro  Dyslipidemia, goal LDL below 100 - due aortic stenosis - continue statin therapy   Essential hypertension -well controlled -Continue diltiazem   Hypokalemia - likely due to GI losses - supplemented through IV fluids -resolved   Acute renal failure - due to GI losses, dehydration - resolved  Hypothyroidism - continue synthroid - TSH is low 0.123 so will decrease the synthroid dose  DVT prophylaxis:  - on anticoagulation with xarelto    Code Status: Full Family Communication: Plan of care discussed with the patient  Disposition Plan: anticipate discharge home in the next 24 hours    IV access:   Peripheral IV  Procedures and diagnostic studies;  Dg Abd Acute W/chest 08/29/2014 No evidence of bowel obstruction or free air. No acute cardiopulmonary disease.    Medical consultants:   None   Other consultants:   None   Anti-infectives:   Rocephin 08/30/2014 -->   Jeralyn Bennett, MD  [facility] Pager [phone]  If 7PM-7AM, please contact night-coverage [email or web address] Password [email or web address] 09/01/2014, 1:32 PM   LOS: 3 days   HPI/Subjective: No events overnight.   Objective: Filed Vitals:   08/31/14 1057 08/31/14 1558 08/31/14 2014 09/01/14 0444  BP: 116/54 90/64 130/77 109/71  Pulse: 76 87 91 87  Temp: 98.6 F (37 C) 98.6 F (37 C) 99 F (37.2 C) 98.8 F (37.1 C)  TempSrc: Oral Oral Oral Oral  Resp: Height:      Weight:      SpO2: 98% 100% 97% 98%    Intake/Output Summary (Last 24 hours) at 09/01/14 1332 Last data filed at 09/01/14 0900  Gross per 24 hour  Intake   2400 ml  Output    300 ml  Net   2100 ml    Exam:   General:  Pt is alert, follows commands appropriately, not in acute distress  Cardiovascular: rate controleed, S1/S2 (+)  Respiratory: Clear to auscultation bilaterally, no wheezing, no crackles, no  rhonchi  Abdomen: Soft, non tender, non distended, bowel sounds present, no guarding  Extremities: No edema, pulses DP and PT palpable bilaterally  Neuro: Grossly nonfocal  Data Reviewed: Basic Metabolic Panel:  Recent Labs Lab 08/29/14 1251 08/29/14 1528 08/30/14 0336 08/31/14 0404  NA 137  --  143 142  K 2.6*  --  2.6* 4.2  CL 95*  --  100 109  CO2 28  --  30 25  GLUCOSE 132*  --  131* 117*  BUN 77*  --  78* 55*  CREATININE 2.04*  --  1.95* 1.15*  CALCIUM 9.8  --  9.4 8.8  MG  --  2.3  --  2.1   Liver Function Tests:  Recent Labs Lab 08/29/14 1251 08/30/14 0336  AST 133* 166*  ALT 92* 121*  ALKPHOS 104 96  BILITOT 1.2 1.3*  PROT 7.8 6.6  ALBUMIN 4.7 4.0    Recent Labs Lab 08/29/14 1251  LIPASE 29   No results for input(s): AMMONIA in the last 168 hours. CBC:  Recent Labs Lab 08/29/14 1251 08/30/14 0336 08/31/14 0404  WBC 11.1* 9.9 8.4  NEUTROABS 7.4  --   --   HGB 16.1* 14.5 14.4  HCT 46.9* 43.9 43.1  MCV 90.0 90.9 91.7  PLT 310 272 224   Cardiac Enzymes:  Recent Labs Lab 08/29/14 1251  TROPONINI <0.03   BNP: Invalid input(s): POCBNP CBG:  Recent Labs Lab 08/30/14 0739 08/31/14 0322 08/31/14 0731 09/01/14 0719  GLUCAP 113* 119* 111* 93    Recent Results (from the past 240 hour(s))  Urine culture     Status: None   Collection Time: 08/29/14 12:42 PM  Result Value Ref Range Status   Specimen Description URINE, CLEAN CATCH  Final   Special Requests NONE  Final   Colony Count   Final    >=100,000 COLONIES/ML Performed at [facility]    Culture   Final    ENTEROBACTER CLOACAE Performed at [facility]    Report Status 08/31/2014 FINAL  Final   Organism ID, Bacteria ENTEROBACTER CLOACAE  Final      Susceptibility   Enterobacter cloacae - MIC*    CEFAZOLIN >=64 RESISTANT Resistant     CEFTRIAXONE <=1 SENSITIVE Sensitive     CIPROFLOXACIN <=0.25 SENSITIVE Sensitive     GENTAMICIN <=1 SENSITIVE Sensitive      LEVOFLOXACIN <=0.12 SENSITIVE Sensitive     NITROFURANTOIN 32 SENSITIVE Sensitive     TOBRAMYCIN <=1 SENSITIVE Sensitive     TRIMETH/SULFA <=20 SENSITIVE Sensitive     PIP/TAZO <=4 SENSITIVE Sensitive     * ENTEROBACTER CLOACAE  Clostridium Difficile by PCR     Status: None   Collection Time: 08/30/14 10:30 AM  Result Value Ref Range Status   C difficile by pcr NEGATIVE NEGATIVE Final    Comment: Performed at [facility]     Scheduled Meds: . cholecalciferol  1,000 Units Oral Daily  . ciprofloxacin  500 mg Oral BID  . diltiazem  120 mg Oral QHS  . feeding supplement (ENSURE COMPLETE)  237 mL Oral Q24H  . levothyroxine  125 mcg  Oral QAC breakfast  . pravastatin  20 mg Oral q1800  . Rivaroxaban  15 mg Oral Q supper   Continuous Infusions:      Time spent: 25 minutes

## 2014-09-01 NOTE — Progress Notes (Signed)
Patient voided X2, noted drops of bright red blood, look like its from the rectum, noted large bloody hemorid, patient denies any pain/distress, Dr. Vanessa Barbara notified and he ordered UA. Will continue to assess patient.

## 2014-09-01 NOTE — Evaluation (Signed)
Physical Therapy Evaluation Patient Details Name: Makayla Thomas MRN: [id number] DOB: March 08, 1926 Today's Date: 09/01/2014   History of Present Illness  admit with naseated and diarrhea and dehydration. Then had slurred speech and weakness on 08/31/2014 so code CVA called. systoms seemed to have resolved durign the day and neuro consulted.   Clinical Impression  Pt with some notable weakness in general and aware she is a bit. To benefit from continued PT here to increase ambulation , mobility and strength to return home to her independent apartment.      Follow Up Recommendations Home health PT (she has access to the PTs at ther facility and siad she will call on them if she feels the need. )    Equipment Recommendations  None recommended by PT    Recommendations for Other Services       Precautions / Restrictions        Mobility  Bed Mobility Overal bed mobility: Modified Independent             General bed mobility comments: used rails and increased time , but did not want my assistance  Transfers Overall transfer level: Modified independent Equipment used: Rolling walker (2 wheeled)                Ambulation/Gait Ambulation/Gait assistance: Supervision Ambulation Distance (Feet): 100 Feet Assistive device: Rolling walker (2 wheeled) (trouble with thsi one for she is used to a rollator) Gait Pattern/deviations: Step-through pattern;Antalgic Gait velocity: slow    General Gait Details: pt seemed to get tired at end of walka nd using RW seemdd to be a little more difficlut. She has an antalgic gait on R side states that right side she has favored for a whil now with pain in her groin area.   Stairs            Wheelchair Mobility    Modified Rankin (Stroke Patients Only)       Balance Overall balance assessment: No apparent balance deficits (not formally assessed)                                           Pertinent Vitals/Pain  Pain Assessment: No/denies pain    Home Living Family/patient expects to be discharged to:: Other (Comment) (independent living apartment) Living Arrangements: Alone               Additional Comments: there are fols that can check on her in the facility.     Prior Function Level of Independence: Independent with assistive device(s)         Comments: uses rollator for ambulation and walks to dining hall for meals , but can independently fix her own meals and ADLS as well.      Hand Dominance        Extremity/Trunk Assessment               Lower Extremity Assessment: Overall WFL for tasks assessed         Communication   Communication: No difficulties  Cognition Arousal/Alertness: Awake/alert Behavior During Therapy: WFL for tasks assessed/performed Overall Cognitive Status: Within Functional Limits for tasks assessed                      General Comments      Exercises        Assessment/Plan    PT Assessment Patient  needs continued PT services  PT Diagnosis Generalized weakness   PT Problem List Decreased strength;Decreased activity tolerance;Decreased mobility  PT Treatment Interventions Gait training;Functional mobility training;Therapeutic activities;Therapeutic exercise   PT Goals (Current goals can be found in the Care Plan section) Acute Rehab PT Goals Patient Stated Goal: I would like to return to my own independent apartment  PT Goal Formulation: With patient Time For Goal Achievement: 09/15/14 Potential to Achieve Goals: Good    Frequency Min 3X/week   Barriers to discharge        Co-evaluation               End of Session Equipment Utilized During Treatment: Gait belt Activity Tolerance: Patient tolerated treatment well Patient left: in bed Nurse Communication: Mobility status         Time: 1730-1800 PT Time Calculation (min) (ACUTE ONLY): 30 min   Charges:   PT Evaluation $Initial PT Evaluation Tier I:  1 Procedure PT Treatments $Gait Training: 8-22 mins   PT G CodesMarella Bile 2014/09/06, 7:39 PM

## 2014-09-02 DIAGNOSIS — I482 Chronic atrial fibrillation, unspecified: Secondary | ICD-10-CM | POA: Insufficient documentation

## 2014-09-02 DIAGNOSIS — K625 Hemorrhage of anus and rectum: Secondary | ICD-10-CM | POA: Insufficient documentation

## 2014-09-02 DIAGNOSIS — Z7901 Long term (current) use of anticoagulants: Secondary | ICD-10-CM | POA: Insufficient documentation

## 2014-09-02 LAB — CBC
HCT: 35.8 % — ABNORMAL LOW (ref 36.0–46.0)
HEMOGLOBIN: 12 g/dL (ref 12.0–15.0)
MCH: 30.7 pg (ref 26.0–34.0)
MCHC: 33.5 g/dL (ref 30.0–36.0)
MCV: 91.6 fL (ref 78.0–100.0)
PLATELETS: 156 10*3/uL (ref 150–400)
RBC: 3.91 MIL/uL (ref 3.87–5.11)
RDW: 14.6 % (ref 11.5–15.5)
WBC: 6.9 10*3/uL (ref 4.0–10.5)

## 2014-09-02 LAB — BASIC METABOLIC PANEL
ANION GAP: 7 (ref 5–15)
BUN: 23 mg/dL (ref 6–23)
CALCIUM: 8.3 mg/dL — AB (ref 8.4–10.5)
CO2: 23 mmol/L (ref 19–32)
CREATININE: 0.81 mg/dL (ref 0.50–1.10)
Chloride: 110 mEq/L (ref 96–112)
GFR calc non Af Amer: 63 mL/min — ABNORMAL LOW (ref >90)
GFR, EST AFRICAN AMERICAN: 74 mL/min — AB (ref >90)
GLUCOSE: 99 mg/dL (ref 70–99)
POTASSIUM: 3.9 mmol/L (ref 3.5–5.1)
SODIUM: 140 mmol/L (ref 135–145)

## 2014-09-02 LAB — GLUCOSE, CAPILLARY: Glucose-Capillary: 98 mg/dL (ref 70–99)

## 2014-09-02 NOTE — Progress Notes (Signed)
Patient ID: Catheleen Langhorne, female   DOB: May 10, 1926, 81 y.o.   MRN: [id number] [facility] PROGRESS NOTE  Byanca Kasper JYN:[id number] DOB: July 25, 1926 DOA: 08/29/2014 PCP: Londell Moh, MD  Brief narrative: 81 year old female with past medical history of hypertension, dyslipidemia, atrial fibrillation on anticoagulation with xarelto, hypothyroidism who presented to [facility] ED with ongoing diarrhea, nausea and vomiting for past few days prior to this admission. She recently had treatment for H.pylori and reported being on 4 medication which she believes caused her to have nausea, vomiting and diarrhea. Of note, she was seen by PCP prior to this admission and was told her potassium was low and was advised to come to ED fort further evaluation.   In ED, BP was 99/84, HR 62, RR 20, afebrile. Blood work showed WBC count of 11.1, hemoglobin 16.1, potassium 2.6, BUN 77 and creatinine 2.04. Abdominal x ray showed no obstruction. She was admitted for further evaluation and management.  Summary 08/31/2014-09/02/2014  On the morning of 08/31/2014 patient noted by nursing staff to have slurred speech for which code CVA was called. She was evaluated by neurology. CT scan of brain did not show acute pathology. She was further worked up with an MRI of the brain which was negative for CVA. Carotid Dopplers unremarkable. Neurological deficits resolving in 24 hours. Likely had a TIA. She was also noted to have rectal bleed felt to be secondary to hemorrhoidal bleed. On my inspection she have external hemorrhoids with evidence of recent bleed. She is on anticoagulation for chronic atrial fibrillation which was not stopped given history of chronic afib and concern for CVA. She remained hemodynamically stable as rectal bleed gradually resolved. Her hemoglobin did trend down to 12.0 on 09/02/2014 from 14.4. Plan to monitor for the next 24 hours, repeat hemoglobin in a.m. If Hg remains stable and bleeding has stopped would  recommend discharge home with home health services in a.m.   Assessment and Plan:    Principal Problem: Suspected TIA -Patient have an episode of slurred speech on 08/31/2014 resolving over the course of the day. -Code CVA was called that she was evaluated by neurology. -Initial CT scan of brain did not show acute pathology, MRI of brain negative for acute CVA, carotid Dopplers unremarkable -Given hemodynamic stability and improvement of rectal bleed will continue anticoagulation with Xarelto for now -Pt is stable from a neurologic standpoint   Dehydration / Nausea and vomiting / Diarrhea / Leukocytosis - unclear etiology; possible viral gastroenteritis. -Stool for Cdiff negative -Resolved  Rectal bleed -Patient having a few episodes of painless rectal bleed in the past 24 hours, remains hemodynamically stable -Had external hemorrhoids on inspection with evidence of recent bleed  -Suspect rectal bleed due to hemorrhoidal bleed possibly aggravated by anticoagulation -Rectal bleed improving today, with a.m. labs showing hemoglobin of 12 -Will monitor her for the next 24 hours, repeat CBC in a.m. For now will continue anticoagulation with Xarelto since she has a significant risk for CVA. Permanent atrial fibrillation / Chronic anticoagulation - Patient remains rate controlled with Cardizem, heart rates in the 60s to 70s - Continue anticoagulation with Xarelto Urinary tract infection - gram negative rods on urine culture - Urine cultures growing Enterobacter species, organism susceptible to fluoroquinolones -Discontinued ceftriaxone on 09/01/2014 -Tolerating cipro  Dyslipidemia, goal LDL below 100 - due aortic stenosis - continue statin therapy   Essential hypertension -well controlled -Continue diltiazem   Hypokalemia - likely due to GI losses - supplemented through IV fluids -resolved  Acute renal failure - due to GI losses, dehydration - resolved  Hypothyroidism -  continue synthroid - TSH is low 0.123 so will decrease the synthroid dose  DVT prophylaxis:  - on anticoagulation with xarelto    Code Status: Full Family Communication: Plan of care discussed with the patient  Disposition Plan: anticipate discharge home in the next 24 hours with home health servcies   IV access:   Peripheral IV  Procedures and diagnostic studies;  Dg Abd Acute W/chest 08/29/2014 No evidence of bowel obstruction or free air. No acute cardiopulmonary disease.    Medical consultants:   None   Other consultants:   None   Anti-infectives:   Rocephin 08/30/2014 -->   Jeralyn Bennett, MD  [facility] Pager [phone]  If 7PM-7AM, please contact night-coverage [email or web address] Password [email or web address] 09/02/2014, 12:44 PM   LOS: 4 days   HPI/Subjective: No events overnight.   Objective: Filed Vitals:   09/01/14 0444 09/01/14 1300 09/01/14 2014 09/02/14 0548  BP: 109/71 111/62 131/68 126/72  Pulse: 87 71 91 78  Temp: 98.8 F (37.1 C) 98.2 F (36.8 C) 98.5 F (36.9 C) 98.7 F (37.1 C)  TempSrc: Oral Oral Oral Oral  Resp: Height:      Weight:      SpO2: 98% 98% 97% 98%    Intake/Output Summary (Last 24 hours) at 09/02/14 1244 Last data filed at 09/02/14 1105  Gross per 24 hour  Intake    480 ml  Output    875 ml  Net   -395 ml    Exam:   General:  Pt is alert, follows commands appropriately, not in acute distress  Cardiovascular: rate controleed, S1/S2 (+)  Respiratory: Clear to auscultation bilaterally, no wheezing, no crackles, no rhonchi  Abdomen: Soft, non tender, non distended, bowel sounds present, no guarding  Extremities: No edema, pulses DP and PT palpable bilaterally  Neuro: Grossly nonfocal  Data Reviewed: Basic Metabolic Panel:  Recent Labs Lab 08/29/14 1251 08/29/14 1528 08/30/14 0336 08/31/14 0404 09/02/14 0348  NA 137  --  143 142 140  K 2.6*  --  2.6* 4.2 3.9  CL 95*  --  100 109 110  CO2 28  --  GLUCOSE 132*  --  131* 117* 99  BUN 77*  --  78* 55* 23  CREATININE 2.04*  --  1.95* 1.15* 0.81  CALCIUM 9.8  --  9.4 8.8 8.3*  MG  --  2.3  --  2.1  --    Liver Function Tests:  Recent Labs Lab 08/29/14 1251 08/30/14 0336  AST 133* 166*  ALT 92* 121*  ALKPHOS 104 96  BILITOT 1.2 1.3*  PROT 7.8 6.6  ALBUMIN 4.7 4.0    Recent Labs Lab 08/29/14 1251  LIPASE 29   No results for input(s): AMMONIA in the last 168 hours. CBC:  Recent Labs Lab 08/29/14 1251 08/30/14 0336 08/31/14 0404 09/02/14 0348  WBC 11.1* 9.9 8.4 6.9  NEUTROABS 7.4  --   --   --   HGB 16.1* 14.5 14.4 12.0  HCT 46.9* 43.9 43.1 35.8*  MCV 90.0 90.9 91.7 91.6  PLT 310 272 224 156   Cardiac Enzymes:  Recent Labs Lab 08/29/14 1251  TROPONINI <0.03   BNP: Invalid input(s): POCBNP CBG:  Recent Labs Lab 08/30/14 0739 08/31/14 0322 08/31/14 0731 09/01/14 0719 09/02/14 0756  GLUCAP 113* 119* 111* 93 98    Recent Results (from  the past 240 hour(s))  Urine culture     Status: None   Collection Time: 08/29/14 12:42 PM  Result Value Ref Range Status   Specimen Description URINE, CLEAN CATCH  Final   Special Requests NONE  Final   Colony Count   Final    >=100,000 COLONIES/ML Performed at [facility]    Culture   Final    ENTEROBACTER CLOACAE Performed at [facility]    Report Status 08/31/2014 FINAL  Final   Organism ID, Bacteria ENTEROBACTER CLOACAE  Final      Susceptibility   Enterobacter cloacae - MIC*    CEFAZOLIN >=64 RESISTANT Resistant     CEFTRIAXONE <=1 SENSITIVE Sensitive     CIPROFLOXACIN <=0.25 SENSITIVE Sensitive     GENTAMICIN <=1 SENSITIVE Sensitive     LEVOFLOXACIN <=0.12 SENSITIVE Sensitive     NITROFURANTOIN 32 SENSITIVE Sensitive     TOBRAMYCIN <=1 SENSITIVE Sensitive     TRIMETH/SULFA <=20 SENSITIVE Sensitive     PIP/TAZO <=4 SENSITIVE Sensitive     * ENTEROBACTER CLOACAE  Clostridium Difficile by PCR     Status: None   Collection Time:  08/30/14 10:30 AM  Result Value Ref Range Status   C difficile by pcr NEGATIVE NEGATIVE Final    Comment: Performed at [facility]     Scheduled Meds: . cholecalciferol  1,000 Units Oral Daily  . ciprofloxacin  500 mg Oral BID  . diltiazem  120 mg Oral QHS  . feeding supplement (ENSURE COMPLETE)  237 mL Oral Q24H  . levothyroxine  125 mcg Oral QAC breakfast  . pravastatin  20 mg Oral q1800  . Rivaroxaban  15 mg Oral Q supper   Continuous Infusions:      Time spent: 25 minutes

## 2014-09-03 LAB — CBC
HCT: 36.6 % (ref 36.0–46.0)
HEMOGLOBIN: 11.9 g/dL — AB (ref 12.0–15.0)
MCH: 30.4 pg (ref 26.0–34.0)
MCHC: 32.5 g/dL (ref 30.0–36.0)
MCV: 93.4 fL (ref 78.0–100.0)
Platelets: 174 10*3/uL (ref 150–400)
RBC: 3.92 MIL/uL (ref 3.87–5.11)
RDW: 14.7 % (ref 11.5–15.5)
WBC: 8.3 10*3/uL (ref 4.0–10.5)

## 2014-09-03 LAB — GLUCOSE, CAPILLARY: Glucose-Capillary: 90 mg/dL (ref 70–99)

## 2014-09-03 MED ORDER — BISOPROLOL FUMARATE 10 MG PO TABS: 10.0000 mg | ORAL_TABLET | Freq: Every day | ORAL | Status: DC

## 2014-09-03 MED ORDER — CIPROFLOXACIN HCL 500 MG PO TABS: 500.0000 mg | ORAL_TABLET | Freq: Two times a day (BID) | ORAL | Status: DC

## 2014-09-03 NOTE — Progress Notes (Signed)
Went over all discharge information with pt and daughter.  All questions answered.  VSS.  Pt wheeled out by NT.

## 2014-09-03 NOTE — Discharge Summary (Signed)
Physician Discharge Summary  Makayla Thomas MRN: [id number] DOB/AGE: [birth year] 79 y.o.  PCP: Horatio Pel, MD   Admit date: 08/29/2014 Discharge date: 09/03/2014  Discharge Diagnoses:     Dehydration Active Problems:   Permanent atrial fibrillation   Chronic anticoagulation   Dyslipidemia, goal LDL below 100 - due aortic stenosis   Essential hypertension -well controlled   Nausea and vomiting   Diarrhea   Hypokalemia   Acute renal failure   Leukocytosis   Stroke   Rectal bleeding   Chronic a-fib   TIA (transient ischemic attack)   Anticoagulation adequate  Follow-up recommendations Follow-up with PCP in 3-5 days Lasix on hold, discontinued HCTZ Follow-up BMP in 3-4 days before resuming Lasix      Medication List    STOP taking these medications        bisoprolol-hydrochlorothiazide 10-6.25 MG per tablet  Commonly known as:  ZIAC     furosemide 20 MG tablet  Commonly known as:  LASIX      TAKE these medications        acetaminophen 500 MG tablet  Commonly known as:  TYLENOL  Take 1,000 mg by mouth every 6 (six) hours as needed for moderate pain or headache.     bisoprolol 10 MG tablet  Commonly known as:  ZEBETA  Take 1 tablet (10 mg total) by mouth daily.     cholecalciferol 1000 UNITS tablet  Commonly known as:  VITAMIN D  Take 1,000 Units by mouth daily.     ciprofloxacin 500 MG tablet  Commonly known as:  CIPRO  Take 1 tablet (500 mg total) by mouth 2 (two) times daily.     diltiazem 120 MG 24 hr capsule  Commonly known as:  CARDIZEM CD  Take 1 capsule (120 mg total) by mouth at bedtime.     LIVALO 1 MG Tabs  Generic drug:  Pitavastatin Calcium  Take 1 mg by mouth daily.     PRILOSEC PO  Take 1 capsule by mouth daily as needed (indigestion).     saccharomyces boulardii 250 MG capsule  Commonly known as:  FLORASTOR  Take 250 mg by mouth daily.     SYNTHROID 125 MCG tablet  Generic drug:  levothyroxine  Take 125 mcg by  mouth daily.     XARELTO 15 MG Tabs tablet  Generic drug:  Rivaroxaban  Take 15 mg by mouth every evening.        Discharge Condition:  Disposition: Home health physical therapy   Consults: None   Significant Diagnostic Studies: Ct Head Wo Contrast  08/31/2014   CLINICAL DATA:  Acute onset of right-sided weakness and inability to move the right side of the mouth. Initial encounter.  EXAM: CT HEAD WITHOUT CONTRAST  TECHNIQUE: Contiguous axial images were obtained from the base of the skull through the vertex without intravenous contrast.  COMPARISON:  CT of the head performed 06/12/2010, and MRI of the brain performed 01/17/2010  FINDINGS: There is no evidence of acute infarction, mass lesion, or intra- or extra-axial hemorrhage on CT.  Prominence of the ventricles and sulci reflects moderate cortical volume loss. Scattered periventricular and subcortical white matter change likely reflects small vessel ischemic microangiopathy. Cerebellar atrophy is noted. Scattered chronic lacunar infarcts are seen at the basal ganglia bilaterally.  The brainstem and fourth ventricle are within normal limits. The cerebral hemispheres demonstrate grossly normal gray-white differentiation. No mass effect or midline shift is seen.  There is no evidence of fracture;  visualized osseous structures are unremarkable in appearance. The orbits are within normal limits. The paranasal sinuses and mastoid air cells are well-aerated. No significant soft tissue abnormalities are seen.  IMPRESSION: 1. No acute intracranial pathology seen on CT. 2. Moderate cortical volume loss and scattered small vessel ischemic microangiopathy. 3. Scattered chronic lacunar infarcts at the basal ganglia bilaterally.  These results were called by telephone at the time of interpretation on 08/31/2014 at 4:25 am to Dr. Roland Rack, who verbally acknowledged these results.   Electronically Signed   By: Garald Balding M.D.   On: 08/31/2014  04:31   Mr Brain Wo Contrast  08/31/2014   CLINICAL DATA:  Brief moment of forgetfulness occurring earlier today. Symptoms have now resolved. History of permanent atrial fibrillation with anticoagulation. History of prior CVA. History of multiple sclerosis.  EXAM: MRI HEAD WITHOUT CONTRAST  TECHNIQUE: Multiplanar, multiecho pulse sequences of the brain and surrounding structures were obtained without intravenous contrast.  COMPARISON:  CT head earlier today.  MR head 08/30/2009.  FINDINGS: No evidence for acute infarction, hemorrhage, mass lesion, hydrocephalus, or extra-axial fluid. Advanced cerebral and cerebellar atrophy. Moderately advanced T2 and FLAIR hyperintensities in the periventricular and subcortical white matter, which likely represent a combination of chronic microvascular ischemic change and multiple sclerosis. The patient has a history of multiple sclerosis with no reported exacerbations recently. There is only mild white matter signal abnormality in the cerebellum and pons. Scattered lacunar infarcts. Scattered prominent perivascular spaces.  Pituitary, pineal, and cerebellar tonsils unremarkable. No upper cervical lesions. Flow voids are maintained throughout the carotid, basilar, and vertebral arteries. There are no areas of chronic hemorrhage. 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 Visualized calvarium, skull base, and upper cervical osseous structures unremarkable. Scalp and extracranial soft tissues, orbits, sinuses, and mastoids show no acute process.  Compared with the recent CT, there is no change. Compared with prior MR, the infarct has resolved.  IMPRESSION: Advanced atrophy and white matter disease.  See discussion above.  Resolved RIGHT posterior frontal acute infarct from 2011.  No acute intracranial abnormality.   Electronically Signed   By: Rolla Flatten M.D.   On: 08/31/2014 11:15   Dg Abd Acute W/chest  08/29/2014   CLINICAL DATA:  Vomiting,  weakness. Nausea and diarrhea. Recent scratch head recent treatment for C difficile.  EXAM: ACUTE ABDOMEN SERIES (ABDOMEN 2 VIEW & CHEST 1 VIEW)  COMPARISON:  08/30/2009  FINDINGS: Heart is borderline in size.  Lungs are clear.  No effusions.  Nonobstructive bowel gas pattern. No free air. Prior cholecystectomy. No organomegaly or suspicious calcification. Vascular calcifications noted. No acute bony abnormality. Degenerative changes in the thoracolumbar spine and hips, right greater than left.  IMPRESSION: No evidence of bowel obstruction or free air.  No acute cardiopulmonary disease.   Electronically Signed   By: Rolm Baptise M.D.   On: 08/29/2014 14:23   **   Microbiology: Recent Results (from the past 240 hour(s))  Urine culture     Status: None   Collection Time: 08/29/14 12:42 PM  Result Value Ref Range Status   Specimen Description URINE, CLEAN CATCH  Final   Special Requests NONE  Final   Colony Count   Final    >=100,000 COLONIES/ML Performed at [facility]    Culture   Final    ENTEROBACTER CLOACAE Performed at [facility]    Report Status 08/31/2014 FINAL  Final   Organism ID, Bacteria ENTEROBACTER CLOACAE  Final      Susceptibility   Enterobacter cloacae - MIC*    CEFAZOLIN >=64 RESISTANT Resistant     CEFTRIAXONE <=1 SENSITIVE Sensitive     CIPROFLOXACIN <=0.25 SENSITIVE Sensitive     GENTAMICIN <=1 SENSITIVE Sensitive     LEVOFLOXACIN <=0.12 SENSITIVE Sensitive     NITROFURANTOIN 32 SENSITIVE Sensitive     TOBRAMYCIN <=1 SENSITIVE Sensitive     TRIMETH/SULFA <=20 SENSITIVE Sensitive     PIP/TAZO <=4 SENSITIVE Sensitive     * ENTEROBACTER CLOACAE  Clostridium Difficile by PCR     Status: None   Collection Time: 08/30/14 10:30 AM  Result Value Ref Range Status   C difficile by pcr NEGATIVE NEGATIVE Final    Comment: Performed at [facility]: Results for orders placed or performed during the hospital encounter of 08/29/14 (from  the past 48 hour(s))  Urinalysis, Routine w reflex microscopic     Status: None   Collection Time: 09/01/14  3:44 PM  Result Value Ref Range   Color, Urine YELLOW YELLOW   APPearance CLEAR CLEAR   Specific Gravity, Urine 1.019 1.005 - 1.030   pH 5.5 5.0 - 8.0   Glucose, UA NEGATIVE NEGATIVE mg/dL   Hgb urine dipstick NEGATIVE NEGATIVE   Bilirubin Urine NEGATIVE NEGATIVE   Ketones, ur NEGATIVE NEGATIVE mg/dL   Protein, ur NEGATIVE NEGATIVE mg/dL   Urobilinogen, UA 0.2 0.0 - 1.0 mg/dL   Nitrite NEGATIVE NEGATIVE   Leukocytes, UA NEGATIVE NEGATIVE    Comment: MICROSCOPIC NOT DONE ON URINES WITH NEGATIVE PROTEIN, BLOOD, LEUKOCYTES, NITRITE, OR GLUCOSE <1000 mg/dL.  CBC     Status: Abnormal   Collection Time: 09/02/14  3:48 AM  Result Value Ref Range   WBC 6.9 4.0 - 10.5 K/uL   RBC 3.91 3.87 - 5.11 MIL/uL   Hemoglobin 12.0 12.0 - 15.0 g/dL   HCT 35.8 (L) 36.0 - 46.0 %   MCV 91.6 78.0 - 100.0 fL   MCH 30.7 26.0 - 34.0 pg   MCHC 33.5 30.0 - 36.0 g/dL   RDW 14.6 11.5 - 15.5 %   Platelets 156 150 - 400 K/uL    Comment: DELTA CHECK NOTED REPEATED TO VERIFY PLATELET COUNT CONFIRMED BY SMEAR   Basic metabolic panel     Status: Abnormal   Collection Time: 09/02/14  3:48 AM  Result Value Ref Range   Sodium 140 135 - 145 mmol/L    Comment: Please note change in reference range.   Potassium 3.9 3.5 - 5.1 mmol/L    Comment: Please note change in reference range.   Chloride 110 96 - 112 mEq/L   CO2 23 19 - 32 mmol/L   Glucose, Bld 99 70 - 99 mg/dL   BUN 23 6 - 23 mg/dL    Comment: DELTA CHECK NOTED   Creatinine, Ser 0.81 0.50 - 1.10 mg/dL   Calcium 8.3 (L) 8.4 - 10.5 mg/dL   GFR calc non Af Amer 63 (L) >90 mL/min   GFR calc Af Amer 74 (L) >90 mL/min    Comment: (NOTE) The eGFR has been calculated using the CKD EPI equation. This calculation has not been validated in all clinical situations. eGFR's persistently <90 mL/min signify possible Chronic Kidney Disease.    Anion gap 7 5 -  15  Glucose, capillary     Status: None   Collection Time: 09/02/14  7:56 AM  Result Value Ref Range   Glucose-Capillary 98 70 -  99 mg/dL  CBC     Status: Abnormal   Collection Time: 09/03/14  5:40 AM  Result Value Ref Range   WBC 8.3 4.0 - 10.5 K/uL   RBC 3.92 3.87 - 5.11 MIL/uL   Hemoglobin 11.9 (L) 12.0 - 15.0 g/dL   HCT 36.6 36.0 - 46.0 %   MCV 93.4 78.0 - 100.0 fL   MCH 30.4 26.0 - 34.0 pg   MCHC 32.5 30.0 - 36.0 g/dL   RDW 14.7 11.5 - 15.5 %   Platelets 174 150 - 400 K/uL  Glucose, capillary     Status: None   Collection Time: 09/03/14  7:29 AM  Result Value Ref Range   Glucose-Capillary 90 70 - 99 mg/dL     81 year old female with past medical history of hypertension, dyslipidemia, atrial fibrillation on anticoagulation with xarelto, hypothyroidism who presented to [facility] ED with ongoing diarrhea, nausea and vomiting for past few days prior to this admission. She recently had treatment for H.pylori and reported being on 4 medication which she believes caused her to have nausea, vomiting and diarrhea. Of note, she was seen by PCP prior to this admission and was told her potassium was low and was advised to come to ED fort further evaluation.   In ED, BP was 99/84, HR 62, RR 20, afebrile. Blood work showed WBC count of 11.1, hemoglobin 16.1, potassium 2.6, BUN 77 and creatinine 2.04. Abdominal x ray showed no obstruction. She was admitted for further evaluation and management.  Summary 08/31/2014-09/02/2014 On the morning of 08/31/2014 patient noted by nursing staff to have slurred speech for which code CVA was called. She was evaluated by neurology. CT scan of brain did not show acute pathology. She was further worked up with an MRI of the brain which was negative for CVA. Carotid Dopplers unremarkable. Neurological deficits resolving in 24 hours. Likely had a TIA. She was also noted to have rectal bleed felt to be secondary to hemorrhoidal bleed. On my inspection she have external  hemorrhoids with evidence of recent bleed. She is on anticoagulation for chronic atrial fibrillation which was not stopped given history of chronic afib and concern for CVA. She remained hemodynamically stable as rectal bleed gradually resolved. Her hemoglobin did trend down to 12.0 on 09/02/2014 from 14.4. Patient's hemoglobin has remained stable around 11.9, okay to discharge home with home health services , with close follow-up with PCP   Assessment and Plan:    Principal Problem: Suspected TIA -Patient have an episode of slurred speech on 08/31/2014 resolving over the course of the day. -Code CVA was called that she was evaluated by neurology. -Initial CT scan of brain did not show acute pathology, MRI of brain negative for acute CVA, carotid Dopplers unremarkable -Given hemodynamic stability and improvement of rectal bleed will continue anticoagulation with Xarelto for now -Pt is stable from a neurologic standpoint   Dehydration / Nausea and vomiting / Diarrhea / Leukocytosis-resolved - unclear etiology; possible viral gastroenteritis. -Stool for Cdiff negative negative -Resolved   Rectal bleed -Patient having a few episodes of painless rectal bleed in the past 24 hours, remains hemodynamically stable -Had external hemorrhoids on inspection with evidence of recent bleed  -Suspect rectal bleed due to hemorrhoidal bleed possibly aggravated by anticoagulation -Rectal bleed improving today, with a.m. labs showing hemoglobin of 11.9 Close PCP follow-up with repeat CBC in 3-4 days  For now will continue anticoagulation with Xarelto since she has a significant risk for CVA. X  Permanent atrial fibrillation / Chronic anticoagulation - Patient remains rate controlled with Cardizem, heart rates in the 60s to 70s - Continue anticoagulation with Xarelto  Urinary tract infection - gram negative rods on urine culture - Urine cultures growing Enterobacter species, organism susceptible to  fluoroquinolones -Discontinued ceftriaxone on 09/01/2014 Continue ciprofloxacin for another 5 days post discharge   Dyslipidemia, goal LDL below 100 - due aortic stenosis - continue statin therapy   Essential hypertension -well controlled -Continue diltiazem  Discontinued HCTZ, continue bisoprolol Resume Lasix of creatinine stable the next 3 or 4 days   Hypokalemia - likely due to GI losses - supplemented through IV fluids -resolved   Acute renal failure - due to GI losses, dehydration - resolved Lasix on hold until patient is back to baseline   Hypothyroidism - continue synthroid - TSH is low 0.123 so will decrease the synthroid dose  DVT prophylaxis:  - on anticoagulation with xarelto    Code Status: Full     Discharge Exam: * Blood pressure 123/69, pulse 80, temperature 98.3 F (36.8 C), temperature source Oral, resp. rate 18, height $RemoveBe'5\' 1"'[id number]$  (1.549 m), weight 79.3 kg (174 lb 13.2 oz), SpO2 97 %.   General: Pt is alert, follows commands appropriately, not in acute distress  Cardiovascular: rate controleed, S1/S2 (+)  Respiratory: Clear to auscultation bilaterally, no wheezing, no crackles, no rhonchi  Abdomen: Soft, non tender, non distended, bowel sounds present, no guarding  Extremities: No edema, pulses DP and PT palpable bilaterally  Neuro: Grossly nonfocal       Discharge Instructions    Diet - low sodium heart healthy    Complete by:  As directed      Increase activity slowly    Complete by:  As directed            Follow-up Information    Follow up with Horatio Pel, MD. Schedule an appointment as soon as possible for a visit in 4 days.   Specialty:  Internal Medicine   Why:  Follow-up CBC, BMP   Contact information:   [address] [place] [address] [phone]       Signed: Reyne Dumas 09/03/2014, 10:32 AM

## 2014-09-03 NOTE — Progress Notes (Signed)
Physical Therapy Treatment Note    09/03/14 0930  PT Visit Information  Last PT Received On 09/03/14  Assistance Needed +1  History of Present Illness Pt is an 81 year old female admitted with nausea and diarrhea and dehydration. Then had slurred speech and weakness on 08/31/2014 so code CVA called. symptoms seemed to have resolved during the day and neuro consulted.   PT Time Calculation  PT Start Time (ACUTE ONLY) 0906  PT Stop Time (ACUTE ONLY) 0925  PT Time Calculation (min) (ACUTE ONLY) 19 min  Subjective Data  Subjective Pt agreeable to ambulate.  Pt's MD observed and talked with pt while ambulating.  Pt to d/c home today with HHPT.  Pain Assessment  Pain Assessment No/denies pain  Cognition  Arousal/Alertness Awake/alert  Behavior During Therapy WFL for tasks assessed/performed  Overall Cognitive Status Within Functional Limits for tasks assessed  Bed Mobility  General bed mobility comments up in recliner on arrival  Transfers  Overall transfer level Modified independent  Equipment used Rolling walker (2 wheeled)  Ambulation/Gait  Ambulation/Gait assistance Supervision  Ambulation Distance (Feet) 350 Feet  Assistive device Rolling walker (2 wheeled)  Gait Pattern/deviations Step-through pattern  Gait velocity decr  General Gait Details verbal cues for use of RW as pt typically uses her rollator.  HR 126 and SpO2 95-100% room air upon return to recliner.  PT - End of Session  Activity Tolerance Patient tolerated treatment well  Patient left in chair;with call bell/phone within reach  PT - Assessment/Plan  PT Plan Current plan remains appropriate  PT Frequency (ACUTE ONLY) Min 3X/week  Follow Up Recommendations Home health PT  PT equipment None recommended by PT  PT Goal Progression  Progress towards PT goals Progressing toward goals  PT General Charges  $$ ACUTE PT VISIT 1 Procedure  PT Treatments  $Gait Training 8-22 mins   Zenovia Jarred, PT, DPT 09/03/2014 Pager:  [phone]

## 2014-09-03 NOTE — Discharge Instructions (Addendum)
Follow-up with PCP in 3-5 days Lasix on hold, discontinued HCTZ Follow-up BMP in 3-4 days before resuming Lasix

## 2014-09-04 DIAGNOSIS — Z79899 Other long term (current) drug therapy: Secondary | ICD-10-CM | POA: Diagnosis not present

## 2014-09-04 LAB — GI PATHOGEN PANEL BY PCR, STOOL
C difficile toxin A/B: NOT DETECTED
Campylobacter by PCR: NOT DETECTED
Cryptosporidium by PCR: NOT DETECTED
E COLI (STEC): NOT DETECTED
E coli (ETEC) LT/ST: NOT DETECTED
E coli 0157 by PCR: NOT DETECTED
G LAMBLIA BY PCR: NOT DETECTED
NOROVIRUS G1/G2: NOT DETECTED
Rotavirus A by PCR: NOT DETECTED
Salmonella by PCR: NOT DETECTED
Shigella by PCR: NOT DETECTED

## 2014-09-05 DIAGNOSIS — I4891 Unspecified atrial fibrillation: Secondary | ICD-10-CM | POA: Diagnosis not present

## 2014-09-05 DIAGNOSIS — M1990 Unspecified osteoarthritis, unspecified site: Secondary | ICD-10-CM | POA: Diagnosis not present

## 2014-09-05 DIAGNOSIS — R262 Difficulty in walking, not elsewhere classified: Secondary | ICD-10-CM | POA: Diagnosis not present

## 2014-09-05 DIAGNOSIS — G459 Transient cerebral ischemic attack, unspecified: Secondary | ICD-10-CM | POA: Diagnosis not present

## 2014-09-05 DIAGNOSIS — N3000 Acute cystitis without hematuria: Secondary | ICD-10-CM | POA: Diagnosis not present

## 2014-09-05 DIAGNOSIS — N390 Urinary tract infection, site not specified: Secondary | ICD-10-CM | POA: Diagnosis not present

## 2014-09-05 DIAGNOSIS — I10 Essential (primary) hypertension: Secondary | ICD-10-CM | POA: Diagnosis not present

## 2014-09-05 DIAGNOSIS — Z7901 Long term (current) use of anticoagulants: Secondary | ICD-10-CM | POA: Diagnosis not present

## 2014-09-05 DIAGNOSIS — E876 Hypokalemia: Secondary | ICD-10-CM | POA: Diagnosis not present

## 2014-09-05 DIAGNOSIS — K644 Residual hemorrhoidal skin tags: Secondary | ICD-10-CM | POA: Diagnosis not present

## 2014-09-05 DIAGNOSIS — Z993 Dependence on wheelchair: Secondary | ICD-10-CM | POA: Diagnosis not present

## 2014-09-06 DIAGNOSIS — R600 Localized edema: Secondary | ICD-10-CM | POA: Diagnosis not present

## 2014-09-06 DIAGNOSIS — N3000 Acute cystitis without hematuria: Secondary | ICD-10-CM | POA: Diagnosis not present

## 2014-09-10 DIAGNOSIS — N390 Urinary tract infection, site not specified: Secondary | ICD-10-CM | POA: Diagnosis not present

## 2014-09-10 DIAGNOSIS — I10 Essential (primary) hypertension: Secondary | ICD-10-CM | POA: Diagnosis not present

## 2014-09-10 DIAGNOSIS — R262 Difficulty in walking, not elsewhere classified: Secondary | ICD-10-CM | POA: Diagnosis not present

## 2014-09-10 DIAGNOSIS — G459 Transient cerebral ischemic attack, unspecified: Secondary | ICD-10-CM | POA: Diagnosis not present

## 2014-09-10 DIAGNOSIS — M1990 Unspecified osteoarthritis, unspecified site: Secondary | ICD-10-CM | POA: Diagnosis not present

## 2014-09-10 DIAGNOSIS — I4891 Unspecified atrial fibrillation: Secondary | ICD-10-CM | POA: Diagnosis not present

## 2014-09-11 DIAGNOSIS — R600 Localized edema: Secondary | ICD-10-CM | POA: Diagnosis not present

## 2014-09-11 DIAGNOSIS — Z8673 Personal history of transient ischemic attack (TIA), and cerebral infarction without residual deficits: Secondary | ICD-10-CM | POA: Diagnosis not present

## 2014-09-11 DIAGNOSIS — R7309 Other abnormal glucose: Secondary | ICD-10-CM | POA: Diagnosis not present

## 2014-09-12 DIAGNOSIS — I10 Essential (primary) hypertension: Secondary | ICD-10-CM | POA: Diagnosis not present

## 2014-09-12 DIAGNOSIS — I4891 Unspecified atrial fibrillation: Secondary | ICD-10-CM | POA: Diagnosis not present

## 2014-09-12 DIAGNOSIS — G459 Transient cerebral ischemic attack, unspecified: Secondary | ICD-10-CM | POA: Diagnosis not present

## 2014-09-12 DIAGNOSIS — N390 Urinary tract infection, site not specified: Secondary | ICD-10-CM | POA: Diagnosis not present

## 2014-09-12 DIAGNOSIS — R262 Difficulty in walking, not elsewhere classified: Secondary | ICD-10-CM | POA: Diagnosis not present

## 2014-09-12 DIAGNOSIS — M1990 Unspecified osteoarthritis, unspecified site: Secondary | ICD-10-CM | POA: Diagnosis not present

## 2014-09-13 DIAGNOSIS — N390 Urinary tract infection, site not specified: Secondary | ICD-10-CM | POA: Diagnosis not present

## 2014-09-13 DIAGNOSIS — M1990 Unspecified osteoarthritis, unspecified site: Secondary | ICD-10-CM | POA: Diagnosis not present

## 2014-09-13 DIAGNOSIS — G459 Transient cerebral ischemic attack, unspecified: Secondary | ICD-10-CM | POA: Diagnosis not present

## 2014-09-13 DIAGNOSIS — R262 Difficulty in walking, not elsewhere classified: Secondary | ICD-10-CM | POA: Diagnosis not present

## 2014-09-13 DIAGNOSIS — I10 Essential (primary) hypertension: Secondary | ICD-10-CM | POA: Diagnosis not present

## 2014-09-13 DIAGNOSIS — I4891 Unspecified atrial fibrillation: Secondary | ICD-10-CM | POA: Diagnosis not present

## 2014-09-17 DIAGNOSIS — M1990 Unspecified osteoarthritis, unspecified site: Secondary | ICD-10-CM | POA: Diagnosis not present

## 2014-09-17 DIAGNOSIS — G459 Transient cerebral ischemic attack, unspecified: Secondary | ICD-10-CM | POA: Diagnosis not present

## 2014-09-17 DIAGNOSIS — N390 Urinary tract infection, site not specified: Secondary | ICD-10-CM | POA: Diagnosis not present

## 2014-09-17 DIAGNOSIS — I4891 Unspecified atrial fibrillation: Secondary | ICD-10-CM | POA: Diagnosis not present

## 2014-09-17 DIAGNOSIS — R262 Difficulty in walking, not elsewhere classified: Secondary | ICD-10-CM | POA: Diagnosis not present

## 2014-09-17 DIAGNOSIS — I10 Essential (primary) hypertension: Secondary | ICD-10-CM | POA: Diagnosis not present

## 2014-09-18 DIAGNOSIS — R262 Difficulty in walking, not elsewhere classified: Secondary | ICD-10-CM | POA: Diagnosis not present

## 2014-09-18 DIAGNOSIS — I10 Essential (primary) hypertension: Secondary | ICD-10-CM | POA: Diagnosis not present

## 2014-09-18 DIAGNOSIS — M1990 Unspecified osteoarthritis, unspecified site: Secondary | ICD-10-CM | POA: Diagnosis not present

## 2014-09-18 DIAGNOSIS — N390 Urinary tract infection, site not specified: Secondary | ICD-10-CM | POA: Diagnosis not present

## 2014-09-18 DIAGNOSIS — G459 Transient cerebral ischemic attack, unspecified: Secondary | ICD-10-CM | POA: Diagnosis not present

## 2014-09-18 DIAGNOSIS — I4891 Unspecified atrial fibrillation: Secondary | ICD-10-CM | POA: Diagnosis not present

## 2014-09-19 DIAGNOSIS — G459 Transient cerebral ischemic attack, unspecified: Secondary | ICD-10-CM | POA: Diagnosis not present

## 2014-09-19 DIAGNOSIS — I4891 Unspecified atrial fibrillation: Secondary | ICD-10-CM | POA: Diagnosis not present

## 2014-09-19 DIAGNOSIS — I10 Essential (primary) hypertension: Secondary | ICD-10-CM | POA: Diagnosis not present

## 2014-09-19 DIAGNOSIS — N390 Urinary tract infection, site not specified: Secondary | ICD-10-CM | POA: Diagnosis not present

## 2014-09-19 DIAGNOSIS — R262 Difficulty in walking, not elsewhere classified: Secondary | ICD-10-CM | POA: Diagnosis not present

## 2014-09-19 DIAGNOSIS — M1990 Unspecified osteoarthritis, unspecified site: Secondary | ICD-10-CM | POA: Diagnosis not present

## 2014-09-20 DIAGNOSIS — I10 Essential (primary) hypertension: Secondary | ICD-10-CM | POA: Diagnosis not present

## 2014-09-20 DIAGNOSIS — I4891 Unspecified atrial fibrillation: Secondary | ICD-10-CM | POA: Diagnosis not present

## 2014-09-20 DIAGNOSIS — G459 Transient cerebral ischemic attack, unspecified: Secondary | ICD-10-CM | POA: Diagnosis not present

## 2014-09-20 DIAGNOSIS — M1990 Unspecified osteoarthritis, unspecified site: Secondary | ICD-10-CM | POA: Diagnosis not present

## 2014-09-20 DIAGNOSIS — R262 Difficulty in walking, not elsewhere classified: Secondary | ICD-10-CM | POA: Diagnosis not present

## 2014-09-20 DIAGNOSIS — N390 Urinary tract infection, site not specified: Secondary | ICD-10-CM | POA: Diagnosis not present

## 2014-09-21 DIAGNOSIS — N390 Urinary tract infection, site not specified: Secondary | ICD-10-CM | POA: Diagnosis not present

## 2014-09-21 DIAGNOSIS — I10 Essential (primary) hypertension: Secondary | ICD-10-CM | POA: Diagnosis not present

## 2014-09-21 DIAGNOSIS — I4891 Unspecified atrial fibrillation: Secondary | ICD-10-CM | POA: Diagnosis not present

## 2014-09-21 DIAGNOSIS — M1990 Unspecified osteoarthritis, unspecified site: Secondary | ICD-10-CM | POA: Diagnosis not present

## 2014-09-21 DIAGNOSIS — R262 Difficulty in walking, not elsewhere classified: Secondary | ICD-10-CM | POA: Diagnosis not present

## 2014-09-21 DIAGNOSIS — G459 Transient cerebral ischemic attack, unspecified: Secondary | ICD-10-CM | POA: Diagnosis not present

## 2014-09-25 ENCOUNTER — Telehealth: Payer: Self-pay | Admitting: Cardiology

## 2014-09-25 DIAGNOSIS — I4891 Unspecified atrial fibrillation: Secondary | ICD-10-CM | POA: Diagnosis not present

## 2014-09-25 DIAGNOSIS — I10 Essential (primary) hypertension: Secondary | ICD-10-CM | POA: Diagnosis not present

## 2014-09-25 DIAGNOSIS — M1990 Unspecified osteoarthritis, unspecified site: Secondary | ICD-10-CM | POA: Diagnosis not present

## 2014-09-25 DIAGNOSIS — G459 Transient cerebral ischemic attack, unspecified: Secondary | ICD-10-CM | POA: Diagnosis not present

## 2014-09-25 DIAGNOSIS — R262 Difficulty in walking, not elsewhere classified: Secondary | ICD-10-CM | POA: Diagnosis not present

## 2014-09-25 DIAGNOSIS — N390 Urinary tract infection, site not specified: Secondary | ICD-10-CM | POA: Diagnosis not present

## 2014-09-25 NOTE — Telephone Encounter (Signed)
Close encounter 

## 2014-09-26 DIAGNOSIS — I4891 Unspecified atrial fibrillation: Secondary | ICD-10-CM | POA: Diagnosis not present

## 2014-09-26 DIAGNOSIS — M1990 Unspecified osteoarthritis, unspecified site: Secondary | ICD-10-CM | POA: Diagnosis not present

## 2014-09-26 DIAGNOSIS — G459 Transient cerebral ischemic attack, unspecified: Secondary | ICD-10-CM | POA: Diagnosis not present

## 2014-09-26 DIAGNOSIS — N390 Urinary tract infection, site not specified: Secondary | ICD-10-CM | POA: Diagnosis not present

## 2014-09-26 DIAGNOSIS — R262 Difficulty in walking, not elsewhere classified: Secondary | ICD-10-CM | POA: Diagnosis not present

## 2014-09-26 DIAGNOSIS — I10 Essential (primary) hypertension: Secondary | ICD-10-CM | POA: Diagnosis not present

## 2014-09-27 DIAGNOSIS — I10 Essential (primary) hypertension: Secondary | ICD-10-CM | POA: Diagnosis not present

## 2014-09-27 DIAGNOSIS — G459 Transient cerebral ischemic attack, unspecified: Secondary | ICD-10-CM | POA: Diagnosis not present

## 2014-09-27 DIAGNOSIS — M1990 Unspecified osteoarthritis, unspecified site: Secondary | ICD-10-CM | POA: Diagnosis not present

## 2014-09-27 DIAGNOSIS — R262 Difficulty in walking, not elsewhere classified: Secondary | ICD-10-CM | POA: Diagnosis not present

## 2014-09-27 DIAGNOSIS — I4891 Unspecified atrial fibrillation: Secondary | ICD-10-CM | POA: Diagnosis not present

## 2014-09-27 DIAGNOSIS — N390 Urinary tract infection, site not specified: Secondary | ICD-10-CM | POA: Diagnosis not present

## 2014-09-28 DIAGNOSIS — I10 Essential (primary) hypertension: Secondary | ICD-10-CM | POA: Diagnosis not present

## 2014-09-28 DIAGNOSIS — R262 Difficulty in walking, not elsewhere classified: Secondary | ICD-10-CM | POA: Diagnosis not present

## 2014-09-28 DIAGNOSIS — M1990 Unspecified osteoarthritis, unspecified site: Secondary | ICD-10-CM | POA: Diagnosis not present

## 2014-09-28 DIAGNOSIS — I4891 Unspecified atrial fibrillation: Secondary | ICD-10-CM | POA: Diagnosis not present

## 2014-09-28 DIAGNOSIS — G459 Transient cerebral ischemic attack, unspecified: Secondary | ICD-10-CM | POA: Diagnosis not present

## 2014-09-28 DIAGNOSIS — N390 Urinary tract infection, site not specified: Secondary | ICD-10-CM | POA: Diagnosis not present

## 2014-10-02 DIAGNOSIS — I4891 Unspecified atrial fibrillation: Secondary | ICD-10-CM | POA: Diagnosis not present

## 2014-10-02 DIAGNOSIS — R262 Difficulty in walking, not elsewhere classified: Secondary | ICD-10-CM | POA: Diagnosis not present

## 2014-10-02 DIAGNOSIS — G459 Transient cerebral ischemic attack, unspecified: Secondary | ICD-10-CM | POA: Diagnosis not present

## 2014-10-02 DIAGNOSIS — N390 Urinary tract infection, site not specified: Secondary | ICD-10-CM | POA: Diagnosis not present

## 2014-10-02 DIAGNOSIS — I10 Essential (primary) hypertension: Secondary | ICD-10-CM | POA: Diagnosis not present

## 2014-10-02 DIAGNOSIS — M1990 Unspecified osteoarthritis, unspecified site: Secondary | ICD-10-CM | POA: Diagnosis not present

## 2014-10-03 DIAGNOSIS — G459 Transient cerebral ischemic attack, unspecified: Secondary | ICD-10-CM | POA: Diagnosis not present

## 2014-10-03 DIAGNOSIS — N390 Urinary tract infection, site not specified: Secondary | ICD-10-CM | POA: Diagnosis not present

## 2014-10-03 DIAGNOSIS — R262 Difficulty in walking, not elsewhere classified: Secondary | ICD-10-CM | POA: Diagnosis not present

## 2014-10-03 DIAGNOSIS — I4891 Unspecified atrial fibrillation: Secondary | ICD-10-CM | POA: Diagnosis not present

## 2014-10-03 DIAGNOSIS — I10 Essential (primary) hypertension: Secondary | ICD-10-CM | POA: Diagnosis not present

## 2014-10-03 DIAGNOSIS — M1990 Unspecified osteoarthritis, unspecified site: Secondary | ICD-10-CM | POA: Diagnosis not present

## 2014-10-04 DIAGNOSIS — M1990 Unspecified osteoarthritis, unspecified site: Secondary | ICD-10-CM | POA: Diagnosis not present

## 2014-10-04 DIAGNOSIS — I10 Essential (primary) hypertension: Secondary | ICD-10-CM | POA: Diagnosis not present

## 2014-10-04 DIAGNOSIS — R262 Difficulty in walking, not elsewhere classified: Secondary | ICD-10-CM | POA: Diagnosis not present

## 2014-10-04 DIAGNOSIS — N390 Urinary tract infection, site not specified: Secondary | ICD-10-CM | POA: Diagnosis not present

## 2014-10-04 DIAGNOSIS — G459 Transient cerebral ischemic attack, unspecified: Secondary | ICD-10-CM | POA: Diagnosis not present

## 2014-10-04 DIAGNOSIS — I4891 Unspecified atrial fibrillation: Secondary | ICD-10-CM | POA: Diagnosis not present

## 2014-10-05 DIAGNOSIS — R262 Difficulty in walking, not elsewhere classified: Secondary | ICD-10-CM | POA: Diagnosis not present

## 2014-10-05 DIAGNOSIS — G459 Transient cerebral ischemic attack, unspecified: Secondary | ICD-10-CM | POA: Diagnosis not present

## 2014-10-05 DIAGNOSIS — I4891 Unspecified atrial fibrillation: Secondary | ICD-10-CM | POA: Diagnosis not present

## 2014-10-05 DIAGNOSIS — I10 Essential (primary) hypertension: Secondary | ICD-10-CM | POA: Diagnosis not present

## 2014-10-05 DIAGNOSIS — N390 Urinary tract infection, site not specified: Secondary | ICD-10-CM | POA: Diagnosis not present

## 2014-10-05 DIAGNOSIS — M1990 Unspecified osteoarthritis, unspecified site: Secondary | ICD-10-CM | POA: Diagnosis not present

## 2014-10-08 DIAGNOSIS — M1990 Unspecified osteoarthritis, unspecified site: Secondary | ICD-10-CM | POA: Diagnosis not present

## 2014-10-08 DIAGNOSIS — N390 Urinary tract infection, site not specified: Secondary | ICD-10-CM | POA: Diagnosis not present

## 2014-10-08 DIAGNOSIS — I10 Essential (primary) hypertension: Secondary | ICD-10-CM | POA: Diagnosis not present

## 2014-10-08 DIAGNOSIS — I4891 Unspecified atrial fibrillation: Secondary | ICD-10-CM | POA: Diagnosis not present

## 2014-10-08 DIAGNOSIS — G459 Transient cerebral ischemic attack, unspecified: Secondary | ICD-10-CM | POA: Diagnosis not present

## 2014-10-08 DIAGNOSIS — R262 Difficulty in walking, not elsewhere classified: Secondary | ICD-10-CM | POA: Diagnosis not present

## 2014-10-09 DIAGNOSIS — I4891 Unspecified atrial fibrillation: Secondary | ICD-10-CM | POA: Diagnosis not present

## 2014-10-09 DIAGNOSIS — R262 Difficulty in walking, not elsewhere classified: Secondary | ICD-10-CM | POA: Diagnosis not present

## 2014-10-09 DIAGNOSIS — I10 Essential (primary) hypertension: Secondary | ICD-10-CM | POA: Diagnosis not present

## 2014-10-09 DIAGNOSIS — N390 Urinary tract infection, site not specified: Secondary | ICD-10-CM | POA: Diagnosis not present

## 2014-10-09 DIAGNOSIS — G459 Transient cerebral ischemic attack, unspecified: Secondary | ICD-10-CM | POA: Diagnosis not present

## 2014-10-09 DIAGNOSIS — M1990 Unspecified osteoarthritis, unspecified site: Secondary | ICD-10-CM | POA: Diagnosis not present

## 2014-10-10 DIAGNOSIS — M1990 Unspecified osteoarthritis, unspecified site: Secondary | ICD-10-CM | POA: Diagnosis not present

## 2014-10-10 DIAGNOSIS — I4891 Unspecified atrial fibrillation: Secondary | ICD-10-CM | POA: Diagnosis not present

## 2014-10-10 DIAGNOSIS — G459 Transient cerebral ischemic attack, unspecified: Secondary | ICD-10-CM | POA: Diagnosis not present

## 2014-10-10 DIAGNOSIS — R262 Difficulty in walking, not elsewhere classified: Secondary | ICD-10-CM | POA: Diagnosis not present

## 2014-10-10 DIAGNOSIS — N390 Urinary tract infection, site not specified: Secondary | ICD-10-CM | POA: Diagnosis not present

## 2014-10-10 DIAGNOSIS — I10 Essential (primary) hypertension: Secondary | ICD-10-CM | POA: Diagnosis not present

## 2014-10-11 DIAGNOSIS — E876 Hypokalemia: Secondary | ICD-10-CM | POA: Diagnosis not present

## 2014-10-11 DIAGNOSIS — R600 Localized edema: Secondary | ICD-10-CM | POA: Diagnosis not present

## 2014-10-11 DIAGNOSIS — R7309 Other abnormal glucose: Secondary | ICD-10-CM | POA: Diagnosis not present

## 2014-10-11 DIAGNOSIS — Z8673 Personal history of transient ischemic attack (TIA), and cerebral infarction without residual deficits: Secondary | ICD-10-CM | POA: Diagnosis not present

## 2014-10-11 DIAGNOSIS — N390 Urinary tract infection, site not specified: Secondary | ICD-10-CM | POA: Diagnosis not present

## 2014-10-11 DIAGNOSIS — I10 Essential (primary) hypertension: Secondary | ICD-10-CM | POA: Diagnosis not present

## 2014-10-11 DIAGNOSIS — G459 Transient cerebral ischemic attack, unspecified: Secondary | ICD-10-CM | POA: Diagnosis not present

## 2014-10-11 DIAGNOSIS — I4891 Unspecified atrial fibrillation: Secondary | ICD-10-CM | POA: Diagnosis not present

## 2014-10-11 DIAGNOSIS — M1990 Unspecified osteoarthritis, unspecified site: Secondary | ICD-10-CM | POA: Diagnosis not present

## 2014-10-11 DIAGNOSIS — R262 Difficulty in walking, not elsewhere classified: Secondary | ICD-10-CM | POA: Diagnosis not present

## 2014-10-16 DIAGNOSIS — R262 Difficulty in walking, not elsewhere classified: Secondary | ICD-10-CM | POA: Diagnosis not present

## 2014-10-16 DIAGNOSIS — G459 Transient cerebral ischemic attack, unspecified: Secondary | ICD-10-CM | POA: Diagnosis not present

## 2014-10-16 DIAGNOSIS — M1990 Unspecified osteoarthritis, unspecified site: Secondary | ICD-10-CM | POA: Diagnosis not present

## 2014-10-16 DIAGNOSIS — I10 Essential (primary) hypertension: Secondary | ICD-10-CM | POA: Diagnosis not present

## 2014-10-16 DIAGNOSIS — N390 Urinary tract infection, site not specified: Secondary | ICD-10-CM | POA: Diagnosis not present

## 2014-10-16 DIAGNOSIS — I4891 Unspecified atrial fibrillation: Secondary | ICD-10-CM | POA: Diagnosis not present

## 2014-10-17 DIAGNOSIS — R262 Difficulty in walking, not elsewhere classified: Secondary | ICD-10-CM | POA: Diagnosis not present

## 2014-10-17 DIAGNOSIS — G459 Transient cerebral ischemic attack, unspecified: Secondary | ICD-10-CM | POA: Diagnosis not present

## 2014-10-17 DIAGNOSIS — I4891 Unspecified atrial fibrillation: Secondary | ICD-10-CM | POA: Diagnosis not present

## 2014-10-17 DIAGNOSIS — M1990 Unspecified osteoarthritis, unspecified site: Secondary | ICD-10-CM | POA: Diagnosis not present

## 2014-10-17 DIAGNOSIS — I10 Essential (primary) hypertension: Secondary | ICD-10-CM | POA: Diagnosis not present

## 2014-10-17 DIAGNOSIS — M25551 Pain in right hip: Secondary | ICD-10-CM | POA: Diagnosis not present

## 2014-10-17 DIAGNOSIS — N390 Urinary tract infection, site not specified: Secondary | ICD-10-CM | POA: Diagnosis not present

## 2014-10-18 DIAGNOSIS — I4891 Unspecified atrial fibrillation: Secondary | ICD-10-CM | POA: Diagnosis not present

## 2014-10-18 DIAGNOSIS — G459 Transient cerebral ischemic attack, unspecified: Secondary | ICD-10-CM | POA: Diagnosis not present

## 2014-10-18 DIAGNOSIS — M1990 Unspecified osteoarthritis, unspecified site: Secondary | ICD-10-CM | POA: Diagnosis not present

## 2014-10-18 DIAGNOSIS — I10 Essential (primary) hypertension: Secondary | ICD-10-CM | POA: Diagnosis not present

## 2014-10-18 DIAGNOSIS — N390 Urinary tract infection, site not specified: Secondary | ICD-10-CM | POA: Diagnosis not present

## 2014-10-18 DIAGNOSIS — R262 Difficulty in walking, not elsewhere classified: Secondary | ICD-10-CM | POA: Diagnosis not present

## 2014-10-29 DIAGNOSIS — R7309 Other abnormal glucose: Secondary | ICD-10-CM | POA: Diagnosis not present

## 2014-10-29 DIAGNOSIS — J019 Acute sinusitis, unspecified: Secondary | ICD-10-CM | POA: Diagnosis not present

## 2014-12-07 ENCOUNTER — Encounter: Payer: Self-pay | Admitting: Cardiology

## 2014-12-07 ENCOUNTER — Ambulatory Visit (INDEPENDENT_AMBULATORY_CARE_PROVIDER_SITE_OTHER): Payer: Medicare Other | Admitting: Cardiology

## 2014-12-07 VITALS — BP 138/72 | Ht 60.0 in | Wt 180.6 lb

## 2014-12-07 DIAGNOSIS — E785 Hyperlipidemia, unspecified: Secondary | ICD-10-CM | POA: Diagnosis not present

## 2014-12-07 DIAGNOSIS — I482 Chronic atrial fibrillation, unspecified: Secondary | ICD-10-CM

## 2014-12-07 DIAGNOSIS — I639 Cerebral infarction, unspecified: Secondary | ICD-10-CM

## 2014-12-07 DIAGNOSIS — Z01810 Encounter for preprocedural cardiovascular examination: Secondary | ICD-10-CM

## 2014-12-07 DIAGNOSIS — I350 Nonrheumatic aortic (valve) stenosis: Secondary | ICD-10-CM

## 2014-12-07 DIAGNOSIS — I10 Essential (primary) hypertension: Secondary | ICD-10-CM

## 2014-12-07 DIAGNOSIS — I4821 Permanent atrial fibrillation: Secondary | ICD-10-CM

## 2014-12-07 DIAGNOSIS — Z7901 Long term (current) use of anticoagulants: Secondary | ICD-10-CM

## 2014-12-07 NOTE — Progress Notes (Signed)
PCP: Londell Moh, MD  Clinic Note: Chief Complaint  Patient presents with  . Annual Exam    would like to discuss hip surgery  . Atrial Fibrillation   Ortho: Dr. Charlann Boxer  HPI: Makayla Thomas is a 81 y.o. female with a PMH below who presents today for routine f/u for persistent / chronic Afib.  Having terrible time with R hip, possible hip arthroplasty surgery. Needs preoperative evaluation.  Past Medical History  Diagnosis Date  . Permanent atrial fibrillation   . Chronic anticoagulation     on Xarelto  . H/O: CVA (cerebrovascular accident)   . Hypothyroidism   . Dyslipidemia     treated  . PAD (peripheral artery disease) 08/30/2009    Dopplers; with bil. post. tib artery occlusion  . Lower extremity edema     chronic  . Aortic stenosis, mild 08/2009    ECHO  EF 55-60%, wth increased EDP, mild   . H/O cardiovascular stress test 05/2010    Lexiscan cardiolite no ischemia or infarction  . History of ETT 02/2011    [place] exercise protocol, negative with poor exercise tolerance  . H/O multiple sclerosis     no excaerbations in some time    Prior Cardiac Evaluation and Past Surgical History: Past Surgical History  Procedure Laterality Date  . Cesarean section      hx of 3  . Throidectomy partial      lt  . Cholecystectomy    . Breast surgery      L tumor removed - benign    Interval History: Relatively stable except Hip pain. Overall decreased exercise tolerance - but not limiting - also more limited by hip and knee pain than dyspnea.   Cardiovascular ROS: no chest pain or dyspnea on exertion positive for - exercise intolerance & mild edema negative for - chest pain, dyspnea on exertion, irregular heartbeat, loss of consciousness, murmur, orthopnea, palpitations, paroxysmal nocturnal dyspnea, rapid heart rate, shortness of breath or TIA, amaurosis fugax, syncope / near-syncope  ROS: A comprehensive was performed. Review of Systems  Cardiovascular:  Positive for leg swelling (mild puffy).  Gastrointestinal: Positive for constipation and blood in stool. Negative for melena.       Had to maintain regularity One hemorrhoidal bleed x 1  Genitourinary: Negative for hematuria.  Musculoskeletal:       Gets tired when she walks  Neurological:       Long-standing balance issues    Current Outpatient Prescriptions on File Prior to Visit  Medication Sig Dispense Refill  . acetaminophen (TYLENOL) 500 MG tablet Take 1,000 mg by mouth every 6 (six) hours as needed for moderate pain or headache.     . bisoprolol (ZEBETA) 10 MG tablet Take 1 tablet (10 mg total) by mouth daily. 30 tablet 2  . cholecalciferol (VITAMIN D) 1000 UNITS tablet Take 1,000 Units by mouth daily.    [name] diltiazem (CARDIZEM CD) 120 MG 24 hr capsule Take 1 capsule (120 mg total) by mouth at bedtime. 90 capsule 3  . Pitavastatin Calcium (LIVALO) 1 MG TABS Take 1 mg by mouth daily.    [name] saccharomyces boulardii (FLORASTOR) 250 MG capsule Take 250 mg by mouth daily.    [name] SYNTHROID 125 MCG tablet Take 125 mcg by mouth daily.    Carlena Hurl 15 MG TABS tablet Take 15 mg by mouth every evening.      No current facility-administered medications on file prior to visit.   Allergies  Allergen Reactions  .  Tetanus Antitoxin Anaphylaxis    Throat swelling  . Meclizine Other (See Comments)    Had funny feeling    History  Substance Use Topics  . Smoking status: Never Smoker   . Smokeless tobacco: Never Used  . Alcohol Use: Yes     Comment: 1 glass every other week at the most   Family History  Problem Relation Age of Onset  . Heart failure Father 84     Wt Readings from Last 3 Encounters:  12/07/14 180 lb 9.6 oz (81.92 kg)  08/29/14 174 lb 13.2 oz (79.3 kg)  04/24/14 183 lb 6.4 oz (83.19 kg)    PHYSICAL EXAM BP 138/72 mmHg  Ht 5' (1.524 m)  Wt 180 lb 9.6 oz (81.92 kg)  BMI 35.27 kg/m2 General appearance: alert, cooperative, appears stated age, no distress, mildly obese  and Very pleasant mood and affect Neck: no adenopathy, no carotid bruit, no JVD, supple, symmetrical, trachea midline, thyroid not enlarged, symmetric, no tenderness/mass/nodules and HEENT: Wears glasses, [place]/AT, MMM, anicteric sclera Lungs: clear to auscultation bilaterally, normal percussion bilaterally and Nonlabored Heart: normal apical impulse, irregularly irregular rhythm, no S3 or S4, no rub and Controlled rate Abdomen: soft, non-tender; bowel sounds normal; no masses, no organomegaly and Mildly obese Extremities: edema Trace to 1+, stable Pulses: Reduced PT pulses bilaterally. Neuro/Psych: CN II-XII grossly intact, normal strength.  Normal Mod & affect   Adult ECG Report  Rate: 62 ;  Rhythm: atrial fibrillation and controlled ventricular response.  Otherwise normal EKG  Narrative Interpretation: Stable EKG   Recent Labs:   Labs do not make sense - would recheck Lab Results  Component Value Date   CHOL 53 09/01/2014   HDL 19* 09/01/2014   LDLCALC 19 09/01/2014   TRIG 75 09/01/2014   CHOLHDL 2.8 09/01/2014    ASSESSMENT / PLAN: Problem List Items Addressed This Visit    Aortic stenosis, mild (Chronic)    Only mild left ear, this would not be any type of risk factor for upcoming surgery. Would simply followup echocardiogram in a year or so.      Relevant Medications   hydrochlorothiazide (HYDRODIURIL) 25 MG tablet   Chronic anticoagulation (Chronic)    On Xarelto no bleeding issues. This would need to be held preoperatively as noted above.      Dyslipidemia, goal LDL below 100 - due aortic stenosis (Chronic)    Treated with statin. Monitored by PCP       Relevant Medications   hydrochlorothiazide (HYDRODIURIL) 25 MG tablet   Essential hypertension -well controlled (Chronic)    3 well-controlled on current regimen. No need to change.      Relevant Medications   hydrochlorothiazide (HYDRODIURIL) 25 MG tablet   Permanent atrial fibrillation - Primary (Chronic)     Stable, rate controlled with metoprolol. Currently on Xarelto low-dose for anticoagulation.      Relevant Medications   hydrochlorothiazide (HYDRODIURIL) 25 MG tablet   Other Relevant Orders   EKG 12-Lead   Preoperative cardiovascular examination   Stroke    This is her only major risk factor preoperatively. She is on Xarelto, but would consider using aspirin while not on Xarelto for surgery.      Relevant Medications   hydrochlorothiazide (HYDRODIURIL) 25 MG tablet     Detailed evaluation with cardiac risk assessment as indicated above. Total time of evaluation patient is close to one hour.  Followup: one year    HARDING, Piedad Climes, M.D., M.S. Interventional Cardiologist  Pager # [phone]

## 2014-12-07 NOTE — Patient Instructions (Signed)
Dr.Harding wants you to follow-up in: ONE YEAR. You will receive a reminder letter in the mail two months in advance. If you don't receive a letter, please call our office to schedule the follow-up appointment.  You have been cleared for your surgery with Dr.Olin. You will need to stop your Xarelto 3 nights prior to surgery.

## 2014-12-09 ENCOUNTER — Encounter: Payer: Self-pay | Admitting: Cardiology

## 2014-12-09 DIAGNOSIS — Z01810 Encounter for preprocedural cardiovascular examination: Secondary | ICD-10-CM | POA: Insufficient documentation

## 2014-12-09 NOTE — Assessment & Plan Note (Signed)
This is her only major risk factor preoperatively. She is on Xarelto, but would consider using aspirin while not on Xarelto for surgery.

## 2014-12-09 NOTE — Assessment & Plan Note (Signed)
Treated with statin. Monitored by PCP

## 2014-12-09 NOTE — Assessment & Plan Note (Signed)
Only mild left ear, this would not be any type of risk factor for upcoming surgery. Would simply followup echocardiogram in a year or so.

## 2014-12-09 NOTE — Assessment & Plan Note (Signed)
Stable, rate controlled with metoprolol. Currently on Xarelto low-dose for anticoagulation.

## 2014-12-09 NOTE — Assessment & Plan Note (Signed)
3 well-controlled on current regimen. No need to change.

## 2014-12-09 NOTE — Assessment & Plan Note (Signed)
On Xarelto no bleeding issues. This would need to be held preoperatively as noted above.

## 2014-12-18 ENCOUNTER — Telehealth: Payer: Self-pay | Admitting: Cardiology

## 2014-12-18 NOTE — Telephone Encounter (Signed)
Pt is still waiting for clarence to have hip surgery. If not at [facility] [phone]

## 2014-12-18 NOTE — Telephone Encounter (Signed)
Spoke to patient  Informed patient , office note was faxed 12/09/14. Patient states she spoke to a person name willis -and she states they have not received information. RN INFORMED PATIENT WILL CONTACT  DR Nilsa Nutting OFFICE AND RESEND INFORMATION.

## 2014-12-18 NOTE — Telephone Encounter (Signed)
SPOKE TO DANIELLE SHE STATES SHE DOES NOT SEE THE OFFICE NOTE IN COMPUTER. RN OBTAINED NEW FAX NUMBER- RE-FAXED OFFICE NOTE  DANIELLE WILL CONTACT OFFICE IF NOTE HAS NOT BEEN RECEIVED. DANIELLE WILL GIVE NOTE TO SHERRI  PATIENT AWARE.

## 2014-12-31 DIAGNOSIS — M25551 Pain in right hip: Secondary | ICD-10-CM | POA: Diagnosis not present

## 2015-01-03 DIAGNOSIS — E785 Hyperlipidemia, unspecified: Secondary | ICD-10-CM | POA: Diagnosis not present

## 2015-01-03 DIAGNOSIS — I10 Essential (primary) hypertension: Secondary | ICD-10-CM | POA: Diagnosis not present

## 2015-01-03 DIAGNOSIS — E1129 Type 2 diabetes mellitus with other diabetic kidney complication: Secondary | ICD-10-CM | POA: Diagnosis not present

## 2015-01-16 DIAGNOSIS — M1611 Unilateral primary osteoarthritis, right hip: Secondary | ICD-10-CM | POA: Diagnosis not present

## 2015-01-16 DIAGNOSIS — E876 Hypokalemia: Secondary | ICD-10-CM | POA: Diagnosis not present

## 2015-01-16 DIAGNOSIS — E1129 Type 2 diabetes mellitus with other diabetic kidney complication: Secondary | ICD-10-CM | POA: Diagnosis not present

## 2015-01-16 DIAGNOSIS — M25551 Pain in right hip: Secondary | ICD-10-CM | POA: Diagnosis not present

## 2015-01-22 NOTE — Patient Instructions (Addendum)
YOUR PROCEDURE IS SCHEDULED ON : 01/29/15  REPORT TO [facility] MAIN ENTRANCE FOLLOW SIGNS TO SHORT STAY CENTER AT :  5:45 AM  CALL THIS NUMBER IF YOU HAVE PROBLEMS THE MORNING OF SURGERY [phone]  REMEMBER:  DO NOT EAT FOOD OR DRINK LIQUIDS AFTER MIDNIGHT  TAKE THESE MEDICINES THE MORNING OF SURGERY: BISOPROLOL / SYNTHROID  YOU MAY NOT HAVE ANY METAL ON YOUR BODY INCLUDING HAIR PINS AND PIERCING'S. DO NOT WEAR JEWELRY, MAKEUP, LOTIONS, POWDERS OR PERFUMES. DO NOT WEAR NAIL POLISH. DO NOT SHAVE 48 HRS PRIOR TO SURGERY. MEN MAY SHAVE FACE AND NECK.  DO NOT BRING VALUABLES TO HOSPITAL. [facility] IS NOT RESPONSIBLE FOR VALUABLES.  CONTACTS, DENTURES OR PARTIALS MAY NOT BE WORN TO SURGERY. LEAVE SUITCASE IN CAR. CAN BE BROUGHT TO ROOM AFTER SURGERY.  PATIENTS DISCHARGED THE DAY OF SURGERY WILL NOT BE ALLOWED TO DRIVE HOME.  PLEASE READ OVER THE FOLLOWING INSTRUCTION SHEETS _________________________________________________________________________________                                          [place] - PREPARING FOR SURGERY  Before surgery, you can play an important role.  Because skin is not sterile, your skin needs to be as free of germs as possible.  You can reduce the number of germs on your skin by washing with CHG (chlorahexidine gluconate) soap before surgery.  CHG is an antiseptic cleaner which kills germs and bonds with the skin to continue killing germs even after washing. Please DO NOT use if you have an allergy to CHG or antibacterial soaps.  If your skin becomes reddened/irritated stop using the CHG and inform your nurse when you arrive at Short Stay. Do not shave (including legs and underarms) for at least 48 hours prior to the first CHG shower.  You may shave your face. Please follow these instructions carefully:   1.  Shower with CHG Soap the night before surgery and the  morning of Surgery.   2.  If you choose to wash your hair, wash your  hair first as usual with your  normal  Shampoo.   3.  After you shampoo, rinse your hair and body thoroughly to remove the  shampoo.                                         4.  Use CHG as you would any other liquid soap.  You can apply chg directly  to the skin and wash . Gently wash with scrungie or clean wascloth    5.  Apply the CHG Soap to your body ONLY FROM THE NECK DOWN.   Do not use on open                           Wound or open sores. Avoid contact with eyes, ears mouth and genitals (private parts).                        Genitals (private parts) with your normal soap.              6.  Wash thoroughly, paying special attention to the area where your surgery  will be performed.  7.  Thoroughly rinse your body with warm water from the neck down.   8.  DO NOT shower/wash with your normal soap after using and rinsing off  the CHG Soap .                9.  Pat yourself dry with a clean towel.             10.  Wear clean night clothes to bed after shower             11.  Place clean sheets on your bed the night of your first shower and do not  sleep with pets.  Day of Surgery : Do not apply any lotions/deodorants the morning of surgery.  Please wear clean clothes to the hospital/surgery center.  FAILURE TO FOLLOW THESE INSTRUCTIONS MAY RESULT IN THE CANCELLATION OF YOUR SURGERY    PATIENT SIGNATURE_________________________________  ______________________________________________________________________     Adam Phenix  An incentive spirometer is a tool that can help keep your lungs clear and active. This tool measures how well you are filling your lungs with each breath. Taking long deep breaths may help reverse or decrease the chance of developing breathing (pulmonary) problems (especially infection) following:  A long period of time when you are unable to move or be active. BEFORE THE PROCEDURE   If the spirometer includes an indicator to show your best  effort, your nurse or respiratory therapist will set it to a desired goal.  If possible, sit up straight or lean slightly forward. Try not to slouch.  Hold the incentive spirometer in an upright position. INSTRUCTIONS FOR USE   Sit on the edge of your bed if possible, or sit up as far as you can in bed or on a chair.  Hold the incentive spirometer in an upright position.  Breathe out normally.  Place the mouthpiece in your mouth and seal your lips tightly around it.  Breathe in slowly and as deeply as possible, raising the piston or the ball toward the top of the column.  Hold your breath for 3-5 seconds or for as long as possible. Allow the piston or ball to fall to the bottom of the column.  Remove the mouthpiece from your mouth and breathe out normally.  Rest for a few seconds and repeat Steps 1 through 7 at least 10 times every 1-2 hours when you are awake. Take your time and take a few normal breaths between deep breaths.  The spirometer may include an indicator to show your best effort. Use the indicator as a goal to work toward during each repetition.  After each set of 10 deep breaths, practice coughing to be sure your lungs are clear. If you have an incision (the cut made at the time of surgery), support your incision when coughing by placing a pillow or rolled up towels firmly against it. Once you are able to get out of bed, walk around indoors and cough well. You may stop using the incentive spirometer when instructed by your caregiver.  RISKS AND COMPLICATIONS  Take your time so you do not get dizzy or light-headed.  If you are in pain, you may need to take or ask for pain medication before doing incentive spirometry. It is harder to take a deep breath if you are having pain. AFTER USE  Rest and breathe slowly and easily.  It can be helpful to keep track of a log of your progress. Your caregiver can provide you  with a simple table to help with this. If you are using  the spirometer at home, follow these instructions: [place] IF:   You are having difficultly using the spirometer.  You have trouble using the spirometer as often as instructed.  Your pain medication is not giving enough relief while using the spirometer.  You develop fever of 100.5 F (38.1 C) or higher. SEEK IMMEDIATE MEDICAL CARE IF:   You cough up bloody sputum that had not been present before.  You develop fever of 102 F (38.9 C) or greater.  You develop worsening pain at or near the incision site. MAKE SURE YOU:   Understand these instructions.  Will watch your condition.  Will get help right away if you are not doing well or get worse. Document Released: 12/14/2006 Document Revised: 10/26/2011 Document Reviewed: 02/14/2007 Exit[employer] Patient Information 2014 [employer], [place].   ________________________________________________________________________  WHAT IS A BLOOD TRANSFUSION? Blood Transfusion Information  A transfusion is the replacement of blood or some of its parts. Blood is made up of multiple cells which provide different functions.  Red blood cells carry oxygen and are used for blood loss replacement.  White blood cells fight against infection.  Platelets control bleeding.  Plasma helps clot blood.  Other blood products are available for specialized needs, such as hemophilia or other clotting disorders. BEFORE THE TRANSFUSION  Who gives blood for transfusions?   Healthy volunteers who are fully evaluated to make sure their blood is safe. This is blood bank blood. Transfusion therapy is the safest it has ever been in the practice of medicine. Before blood is taken from a donor, a complete history is taken to make sure that person has no history of diseases nor engages in risky social behavior (examples are intravenous drug use or sexual activity with multiple partners). The donor's travel history is screened to minimize risk of transmitting  infections, such as malaria. The donated blood is tested for signs of infectious diseases, such as HIV and hepatitis. The blood is then tested to be sure it is compatible with you in order to minimize the chance of a transfusion reaction. If you or a relative donates blood, this is often done in anticipation of surgery and is not appropriate for emergency situations. It takes many days to process the donated blood. RISKS AND COMPLICATIONS Although transfusion therapy is very safe and saves many lives, the main dangers of transfusion include:   Getting an infectious disease.  Developing a transfusion reaction. This is an allergic reaction to something in the blood you were given. Every precaution is taken to prevent this. The decision to have a blood transfusion has been considered carefully by your caregiver before blood is given. Blood is not given unless the benefits outweigh the risks. AFTER THE TRANSFUSION  Right after receiving a blood transfusion, you will usually feel much better and more energetic. This is especially true if your red blood cells have gotten low (anemic). The transfusion raises the level of the red blood cells which carry oxygen, and this usually causes an energy increase.  The nurse administering the transfusion will monitor you carefully for complications. HOME CARE INSTRUCTIONS  No special instructions are needed after a transfusion. You may find your energy is better. Speak with your caregiver about any limitations on activity for underlying diseases you may have. SEEK MEDICAL CARE IF:   Your condition is not improving after your transfusion.  You develop redness or irritation at the intravenous (IV) site.  SEEK IMMEDIATE MEDICAL CARE IF:  Any of the following symptoms occur over the next 12 hours:  Shaking chills.  You have a temperature by mouth above 102 F (38.9 C), not controlled by medicine.  Chest, back, or muscle pain.  People around you feel you are  not acting correctly or are confused.  Shortness of breath or difficulty breathing.  Dizziness and fainting.  You get a rash or develop hives.  You have a decrease in urine output.  Your urine turns a dark color or changes to pink, red, or brown. Any of the following symptoms occur over the next 10 days:  You have a temperature by mouth above 102 F (38.9 C), not controlled by medicine.  Shortness of breath.  Weakness after normal activity.  The white part of the eye turns yellow (jaundice).  You have a decrease in the amount of urine or are urinating less often.  Your urine turns a dark color or changes to pink, red, or brown. Document Released: 07/31/2000 Document Revised: 10/26/2011 Document Reviewed: 03/19/2008 [facility] Patient Information 2014 [place], [place].  _______________________________________________________________________

## 2015-01-23 ENCOUNTER — Encounter (HOSPITAL_COMMUNITY): Payer: Self-pay

## 2015-01-23 ENCOUNTER — Encounter (HOSPITAL_COMMUNITY)
Admission: RE | Admit: 2015-01-23 | Discharge: 2015-01-23 | Disposition: A | Discharge status: Home / Self Care | Payer: Medicare Other | Source: Ambulatory Visit | Attending: Orthopedic Surgery | Admitting: Orthopedic Surgery

## 2015-01-23 DIAGNOSIS — Z7902 Long term (current) use of antithrombotics/antiplatelets: Secondary | ICD-10-CM | POA: Diagnosis not present

## 2015-01-23 DIAGNOSIS — Z01812 Encounter for preprocedural laboratory examination: Secondary | ICD-10-CM | POA: Insufficient documentation

## 2015-01-23 DIAGNOSIS — M1611 Unilateral primary osteoarthritis, right hip: Secondary | ICD-10-CM | POA: Diagnosis not present

## 2015-01-23 DIAGNOSIS — Z0183 Encounter for blood typing: Secondary | ICD-10-CM | POA: Insufficient documentation

## 2015-01-23 HISTORY — DX: Unspecified visual disturbance: H53.9

## 2015-01-23 HISTORY — DX: Anemia, unspecified: D64.9

## 2015-01-23 HISTORY — DX: Transient cerebral ischemic attack, unspecified: G45.9

## 2015-01-23 HISTORY — DX: Other specified noninflammatory disorders of vagina: N89.8

## 2015-01-23 HISTORY — DX: Personal history of other medical treatment: Z92.89

## 2015-01-23 HISTORY — DX: Adverse effect of unspecified anesthetic, initial encounter: T41.45XA

## 2015-01-23 HISTORY — DX: Other complications of anesthesia, initial encounter: T88.59XA

## 2015-01-23 HISTORY — DX: Constipation, unspecified: K59.00

## 2015-01-23 LAB — URINALYSIS, ROUTINE W REFLEX MICROSCOPIC
BILIRUBIN URINE: NEGATIVE
Glucose, UA: NEGATIVE mg/dL
Hgb urine dipstick: NEGATIVE
KETONES UR: NEGATIVE mg/dL
Nitrite: NEGATIVE
PROTEIN: NEGATIVE mg/dL
Specific Gravity, Urine: 1.007 (ref 1.005–1.030)
Urobilinogen, UA: 0.2 mg/dL (ref 0.0–1.0)
pH: 7 (ref 5.0–8.0)

## 2015-01-23 LAB — ABO/RH: ABO/RH(D): O POS

## 2015-01-23 LAB — BASIC METABOLIC PANEL
Anion gap: 12 (ref 5–15)
BUN: 25 mg/dL — ABNORMAL HIGH (ref 6–20)
CHLORIDE: 100 mmol/L — AB (ref 101–111)
CO2: 29 mmol/L (ref 22–32)
Calcium: 9.7 mg/dL (ref 8.9–10.3)
Creatinine, Ser: 0.98 mg/dL (ref 0.44–1.00)
GFR calc Af Amer: 58 mL/min — ABNORMAL LOW (ref >60)
GFR calc non Af Amer: 50 mL/min — ABNORMAL LOW (ref >60)
Glucose, Bld: 106 mg/dL — ABNORMAL HIGH (ref 65–99)
Potassium: 4.2 mmol/L (ref 3.5–5.1)
Sodium: 141 mmol/L (ref 135–145)

## 2015-01-23 LAB — SURGICAL PCR SCREEN
MRSA, PCR: NEGATIVE
Staphylococcus aureus: POSITIVE — AB

## 2015-01-23 LAB — CBC
HEMATOCRIT: 44 % (ref 36.0–46.0)
Hemoglobin: 14.1 g/dL (ref 12.0–15.0)
MCH: 29.6 pg (ref 26.0–34.0)
MCHC: 32 g/dL (ref 30.0–36.0)
MCV: 92.4 fL (ref 78.0–100.0)
Platelets: 277 10*3/uL (ref 150–400)
RBC: 4.76 MIL/uL (ref 3.87–5.11)
RDW: 14.4 % (ref 11.5–15.5)
WBC: 8.8 10*3/uL (ref 4.0–10.5)

## 2015-01-23 LAB — PROTIME-INR
INR: 1.4 (ref 0.00–1.49)
Prothrombin Time: 17.2 seconds — ABNORMAL HIGH (ref 11.6–15.2)

## 2015-01-23 LAB — URINE MICROSCOPIC-ADD ON

## 2015-01-23 LAB — APTT: aPTT: 36 seconds (ref 24–37)

## 2015-01-24 NOTE — Progress Notes (Signed)
Abnormal BMET / UA / PCR faxed to Dr. Charlann Boxer Pt notified of +PCR - Rx called to [facility] on [address]

## 2015-01-24 NOTE — H&P (Signed)
TOTAL HIP ADMISSION H&P  Patient is admitted for right total hip arthroplasty, anterior approach.  Subjective:  Chief Complaint:   Right hip primary OA / pain  HPI: Makayla Thomas, 81 y.o. female, has a history of pain and functional disability in the right hip(s) due to arthritis and patient has failed non-surgical conservative treatments for greater than 12 weeks to include NSAID's and/or analgesics, use of assistive devices and activity modification.  Onset of symptoms was gradual starting >10 years ago with gradually worsening course since that time.The patient noted no past surgery on the right hip(s).  Patient currently rates pain in the right hip at 8 out of 10 with activity. Patient has night pain, worsening of pain with activity and weight bearing, trendelenberg gait, pain that interfers with activities of daily living and pain with passive range of motion. Patient has evidence of periarticular osteophytes and joint space narrowing by imaging studies. This condition presents safety issues increasing the risk of falls.   There is no current active infection.   Risks, benefits and expectations were discussed with the patient.  Risks including but not limited to the risk of anesthesia, blood clots, nerve damage, blood vessel damage, failure of the prosthesis, infection and up to and including death.  Patient understand the risks, benefits and expectations and wishes to proceed with surgery.   PCP: Londell Moh, MD  D/C Plans:      Home with HHPT  Post-op Meds:       No Rx given  Tranexamic Acid:      To be given - topically (previous TIA)  Decadron:      Is to be given  FYI:     Xarelto post-op (on pre-op would like to keep PM dosing)  Norco post-op  Ok with Aquacel (adhesive allergy)   Patient Active Problem List   Diagnosis Date Noted  . Preoperative cardiovascular examination 12/09/2014  . Rectal bleeding   . Chronic a-fib   . TIA (transient ischemic attack)   .  Anticoagulation adequate   . Stroke   . Dehydration 08/29/2014  . Nausea and vomiting 08/29/2014  . Diarrhea 08/29/2014  . Hypokalemia 08/29/2014  . Acute renal failure 08/29/2014  . Leukocytosis 08/29/2014  . Essential hypertension -well controlled 01/10/2013  . Permanent atrial fibrillation   . Chronic anticoagulation   . Dyslipidemia, goal LDL below 100 - due aortic stenosis   . Aortic stenosis, mild 08/17/2009   Past Medical History  Diagnosis Date  . Permanent atrial fibrillation   . Chronic anticoagulation     on Xarelto  . Hypothyroidism   . Dyslipidemia     treated  . PAD (peripheral artery disease) 08/30/2009    Dopplers; with bil. post. tib artery occlusion  . Lower extremity edema     chronic  . Aortic stenosis, mild 08/2009    ECHO  EF 55-60%, wth increased EDP, mild   . H/O cardiovascular stress test 05/2010    Lexiscan cardiolite no ischemia or infarction  . History of ETT 02/2011    [place] exercise protocol, negative with poor exercise tolerance  . H/O multiple sclerosis     no excaerbations in some time  . Complication of anesthesia     hallusinations  . Dysrhythmia     A-FIB  . Visual disturbances     "FLASHING IN MY EYES"  . Arthritis   . Vaginal itching   . Constipation   . Anemia   . History of transfusion   .  Borderline diabetes   . Transient ischemic attack (TIA)     NO RESIDUAL PROBLEMS    Past Surgical History  Procedure Laterality Date  . Cesarean section      hx of 3  . Throidectomy partial      lt  . Cholecystectomy    . Breast surgery      L tumor removed - benign  . Joint replacement  2012    left total knee    No prescriptions prior to admission   Allergies  Allergen Reactions  . Tetanus Antitoxin Anaphylaxis    Throat swelling  . Meclizine Other (See Comments)    Had funny feeling    History  Substance Use Topics  . Smoking status: Never Smoker   . Smokeless tobacco: Never Used  . Alcohol Use: Yes     Comment: 1  glass every other week at the most    Family History  Problem Relation Age of Onset  . Heart failure Father 40     Review of Systems  Constitutional: Negative.   HENT: Negative.   Eyes: Negative.   Respiratory: Negative.   Cardiovascular: Negative.   Gastrointestinal: Negative.   Genitourinary: Negative.   Musculoskeletal: Positive for joint pain.  Skin: Negative.   Neurological: Negative.   Endo/Heme/Allergies: Negative.   Psychiatric/Behavioral: Negative.     Objective:  Physical Exam  Constitutional: She is oriented to person, place, and time. She appears well-developed and well-nourished.  HENT:  Head: Normocephalic and atraumatic.  Eyes: Pupils are equal, round, and reactive to light.  Neck: Neck supple. No JVD present. No tracheal deviation present. No thyromegaly present.  Cardiovascular: Normal rate, regular rhythm, normal heart sounds and intact distal pulses.   Respiratory: Effort normal. No stridor. No respiratory distress. She has no wheezes.  GI: Soft. There is no tenderness. There is no guarding.  Musculoskeletal:       Right hip: She exhibits decreased range of motion, decreased strength, tenderness and bony tenderness. She exhibits no swelling, no deformity and no laceration.  Lymphadenopathy:    She has no cervical adenopathy.  Neurological: She is alert and oriented to person, place, and time. A sensory deficit (numbness in bilateral LE) is present.  Skin: Skin is warm and dry.  Psychiatric: She has a normal mood and affect.    Vital signs in last 24 hours: Temp:  [97.8 F (36.6 C)] 97.8 F (36.6 C) (06/08 1636) Pulse Rate:  [57] 57 (06/08 1636) Resp:  [18] 18 (06/08 1636) BP: (121)/(66) 121/66 mmHg (06/08 1636) SpO2:  [100 %] 100 % (06/08 1636) Weight:  [81.647 kg (180 lb)] 81.647 kg (180 lb) (06/08 1636)  Labs:   Estimated body mass index is 35.27 kg/(m^2) as calculated from the following:   Height as of 12/07/14: 5' (1.524 m).   Weight as  of 12/07/14: 81.92 kg (180 lb 9.6 oz).   Imaging Review Plain radiographs demonstrate severe degenerative joint disease of the right hip(s). The bone quality appears to be good for age and reported activity level.  Assessment/Plan:  End stage arthritis, right hip(s)  The patient history, physical examination, clinical judgement of the provider and imaging studies are consistent with end stage degenerative joint disease of the right hip(s) and total hip arthroplasty is deemed medically necessary. The treatment options including medical management, injection therapy, arthroscopy and arthroplasty were discussed at length. The risks and benefits of total hip arthroplasty were presented and reviewed. The risks due to aseptic loosening, infection,  stiffness, dislocation/subluxation,  thromboembolic complications and other imponderables were discussed.  The patient acknowledged the explanation, agreed to proceed with the plan and consent was signed. Patient is being admitted for inpatient treatment for surgery, pain control, PT, OT, prophylactic antibiotics, VTE prophylaxis, progressive ambulation and ADL's and discharge planning.The patient is planning to be discharged home with home health services.      Makayla Auerbach Senan Urey   PA-C  01/24/2015, 9:21 AM

## 2015-01-29 ENCOUNTER — Inpatient Hospital Stay (HOSPITAL_COMMUNITY): Payer: Medicare Other | Admitting: Anesthesiology

## 2015-01-29 ENCOUNTER — Encounter (HOSPITAL_COMMUNITY): Payer: Self-pay | Admitting: *Deleted

## 2015-01-29 ENCOUNTER — Encounter (HOSPITAL_COMMUNITY)
Admission: RE | Disposition: A | Discharge status: Skilled Nursing Facility | Payer: Self-pay | Source: Ambulatory Visit | Attending: Orthopedic Surgery

## 2015-01-29 ENCOUNTER — Inpatient Hospital Stay (HOSPITAL_COMMUNITY)
Admission: RE | Admit: 2015-01-29 | Discharge: 2015-02-01 | DRG: 470 | Disposition: A | Discharge status: Skilled Nursing Facility | Payer: Medicare Other | Source: Ambulatory Visit | Attending: Orthopedic Surgery | Admitting: Orthopedic Surgery

## 2015-01-29 ENCOUNTER — Inpatient Hospital Stay (HOSPITAL_COMMUNITY): Payer: Medicare Other

## 2015-01-29 DIAGNOSIS — K5900 Constipation, unspecified: Secondary | ICD-10-CM | POA: Diagnosis not present

## 2015-01-29 DIAGNOSIS — Z6834 Body mass index (BMI) 34.0-34.9, adult: Secondary | ICD-10-CM | POA: Diagnosis not present

## 2015-01-29 DIAGNOSIS — Z01812 Encounter for preprocedural laboratory examination: Secondary | ICD-10-CM | POA: Diagnosis not present

## 2015-01-29 DIAGNOSIS — I10 Essential (primary) hypertension: Secondary | ICD-10-CM | POA: Diagnosis present

## 2015-01-29 DIAGNOSIS — I482 Chronic atrial fibrillation: Secondary | ICD-10-CM | POA: Diagnosis present

## 2015-01-29 DIAGNOSIS — N179 Acute kidney failure, unspecified: Secondary | ICD-10-CM | POA: Diagnosis not present

## 2015-01-29 DIAGNOSIS — M6281 Muscle weakness (generalized): Secondary | ICD-10-CM | POA: Diagnosis not present

## 2015-01-29 DIAGNOSIS — Z7901 Long term (current) use of anticoagulants: Secondary | ICD-10-CM

## 2015-01-29 DIAGNOSIS — Z471 Aftercare following joint replacement surgery: Secondary | ICD-10-CM | POA: Diagnosis not present

## 2015-01-29 DIAGNOSIS — R112 Nausea with vomiting, unspecified: Secondary | ICD-10-CM | POA: Diagnosis not present

## 2015-01-29 DIAGNOSIS — Z8673 Personal history of transient ischemic attack (TIA), and cerebral infarction without residual deficits: Secondary | ICD-10-CM

## 2015-01-29 DIAGNOSIS — Z96641 Presence of right artificial hip joint: Secondary | ICD-10-CM | POA: Diagnosis not present

## 2015-01-29 DIAGNOSIS — M25551 Pain in right hip: Secondary | ICD-10-CM | POA: Diagnosis not present

## 2015-01-29 DIAGNOSIS — G8929 Other chronic pain: Secondary | ICD-10-CM | POA: Diagnosis not present

## 2015-01-29 DIAGNOSIS — M62838 Other muscle spasm: Secondary | ICD-10-CM | POA: Diagnosis not present

## 2015-01-29 DIAGNOSIS — R269 Unspecified abnormalities of gait and mobility: Secondary | ICD-10-CM | POA: Diagnosis not present

## 2015-01-29 DIAGNOSIS — E785 Hyperlipidemia, unspecified: Secondary | ICD-10-CM | POA: Diagnosis present

## 2015-01-29 DIAGNOSIS — E569 Vitamin deficiency, unspecified: Secondary | ICD-10-CM | POA: Diagnosis not present

## 2015-01-29 DIAGNOSIS — M1611 Unilateral primary osteoarthritis, right hip: Principal | ICD-10-CM | POA: Diagnosis present

## 2015-01-29 DIAGNOSIS — Z96649 Presence of unspecified artificial hip joint: Secondary | ICD-10-CM

## 2015-01-29 DIAGNOSIS — E039 Hypothyroidism, unspecified: Secondary | ICD-10-CM | POA: Diagnosis present

## 2015-01-29 DIAGNOSIS — G35 Multiple sclerosis: Secondary | ICD-10-CM | POA: Diagnosis present

## 2015-01-29 DIAGNOSIS — M169 Osteoarthritis of hip, unspecified: Secondary | ICD-10-CM | POA: Diagnosis not present

## 2015-01-29 DIAGNOSIS — I4891 Unspecified atrial fibrillation: Secondary | ICD-10-CM | POA: Diagnosis not present

## 2015-01-29 DIAGNOSIS — E119 Type 2 diabetes mellitus without complications: Secondary | ICD-10-CM | POA: Diagnosis present

## 2015-01-29 DIAGNOSIS — D649 Anemia, unspecified: Secondary | ICD-10-CM | POA: Diagnosis not present

## 2015-01-29 DIAGNOSIS — T84040A Periprosthetic fracture around internal prosthetic right hip joint, initial encounter: Secondary | ICD-10-CM | POA: Diagnosis not present

## 2015-01-29 DIAGNOSIS — Z8249 Family history of ischemic heart disease and other diseases of the circulatory system: Secondary | ICD-10-CM | POA: Diagnosis not present

## 2015-01-29 DIAGNOSIS — E669 Obesity, unspecified: Secondary | ICD-10-CM | POA: Diagnosis present

## 2015-01-29 HISTORY — PX: TOTAL HIP ARTHROPLASTY: SHX124

## 2015-01-29 HISTORY — DX: Type 2 diabetes mellitus without complications: E11.9

## 2015-01-29 LAB — TYPE AND SCREEN
ABO/RH(D): O POS
Antibody Screen: NEGATIVE

## 2015-01-29 LAB — PROTIME-INR
INR: 1.15 (ref 0.00–1.49)
PROTHROMBIN TIME: 14.8 s (ref 11.6–15.2)

## 2015-01-29 LAB — GLUCOSE, CAPILLARY: Glucose-Capillary: 101 mg/dL — ABNORMAL HIGH (ref 65–99)

## 2015-01-29 SURGERY — ARTHROPLASTY, HIP, TOTAL, ANTERIOR APPROACH
Anesthesia: Spinal | Site: Hip | Laterality: Right

## 2015-01-29 MED ORDER — ONDANSETRON HCL 4 MG PO TABS: 4.0000 mg | ORAL_TABLET | Freq: Four times a day (QID) | ORAL | Status: DC | PRN

## 2015-01-29 MED ORDER — PHENYLEPHRINE HCL 10 MG/ML IJ SOLN
10.0000 mg | INTRAMUSCULAR | Status: DC | PRN
  Administered 2015-01-29: 15 ug/min via INTRAVENOUS

## 2015-01-29 MED ORDER — TRIAMTERENE-HCTZ 37.5-25 MG PO TABS
1.0000 | ORAL_TABLET | Freq: Every morning | ORAL | Status: DC
  Administered 2015-01-30: 1 via ORAL
  Filled 2015-01-29 (×3): qty 1

## 2015-01-29 MED ORDER — LACTATED RINGERS IV SOLN: INTRAVENOUS | Status: DC

## 2015-01-29 MED ORDER — TRANEXAMIC ACID 1000 MG/10ML IV SOLN
2000.0000 mg | Freq: Once | INTRAVENOUS | Status: DC
  Filled 2015-01-29: qty 20

## 2015-01-29 MED ORDER — PROPOFOL 10 MG/ML IV BOLUS
INTRAVENOUS | Status: AC
  Filled 2015-01-29: qty 20

## 2015-01-29 MED ORDER — FENTANYL CITRATE (PF) 100 MCG/2ML IJ SOLN: 25.0000 ug | INTRAMUSCULAR | Status: DC | PRN

## 2015-01-29 MED ORDER — MENTHOL 3 MG MT LOZG: 1.0000 | LOZENGE | OROMUCOSAL | Status: DC | PRN

## 2015-01-29 MED ORDER — FENTANYL CITRATE (PF) 100 MCG/2ML IJ SOLN
INTRAMUSCULAR | Status: DC | PRN
  Administered 2015-01-29 (×2): 50 ug via INTRAVENOUS

## 2015-01-29 MED ORDER — DEXAMETHASONE SODIUM PHOSPHATE 10 MG/ML IJ SOLN
10.0000 mg | Freq: Once | INTRAMUSCULAR | Status: AC
  Administered 2015-01-29: 10 mg via INTRAVENOUS

## 2015-01-29 MED ORDER — METHOCARBAMOL 500 MG PO TABS
500.0000 mg | ORAL_TABLET | Freq: Four times a day (QID) | ORAL | Status: DC | PRN
  Administered 2015-01-29 – 2015-02-01 (×7): 500 mg via ORAL
  Filled 2015-01-29 (×8): qty 1

## 2015-01-29 MED ORDER — DILTIAZEM HCL ER COATED BEADS 120 MG PO CP24
120.0000 mg | ORAL_CAPSULE | Freq: Every day | ORAL | Status: DC
  Administered 2015-01-29 – 2015-01-31 (×3): 120 mg via ORAL
  Filled 2015-01-29 (×4): qty 1

## 2015-01-29 MED ORDER — CEFAZOLIN SODIUM-DEXTROSE 2-3 GM-% IV SOLR
2.0000 g | Freq: Four times a day (QID) | INTRAVENOUS | Status: AC
  Administered 2015-01-29 (×2): 2 g via INTRAVENOUS
  Filled 2015-01-29 (×2): qty 50

## 2015-01-29 MED ORDER — DOCUSATE SODIUM 100 MG PO CAPS
100.0000 mg | ORAL_CAPSULE | Freq: Two times a day (BID) | ORAL | Status: DC
  Administered 2015-01-29 – 2015-02-01 (×5): 100 mg via ORAL

## 2015-01-29 MED ORDER — CEFAZOLIN SODIUM-DEXTROSE 2-3 GM-% IV SOLR
INTRAVENOUS | Status: AC
  Filled 2015-01-29: qty 50

## 2015-01-29 MED ORDER — BUPIVACAINE HCL (PF) 0.5 % IJ SOLN
INTRAMUSCULAR | Status: DC | PRN
  Administered 2015-01-29: 3 mL

## 2015-01-29 MED ORDER — FERROUS SULFATE 325 (65 FE) MG PO TABS
325.0000 mg | ORAL_TABLET | Freq: Three times a day (TID) | ORAL | Status: DC
  Administered 2015-01-29 – 2015-02-01 (×7): 325 mg via ORAL
  Filled 2015-01-29 (×11): qty 1

## 2015-01-29 MED ORDER — MAGNESIUM CITRATE PO SOLN: 1.0000 | Freq: Once | ORAL | Status: AC | PRN

## 2015-01-29 MED ORDER — PROMETHAZINE HCL 25 MG/ML IJ SOLN
INTRAMUSCULAR | Status: AC
  Filled 2015-01-29: qty 1

## 2015-01-29 MED ORDER — FUROSEMIDE 20 MG PO TABS
20.0000 mg | ORAL_TABLET | Freq: Every morning | ORAL | Status: DC
  Administered 2015-01-30 – 2015-02-01 (×3): 20 mg via ORAL
  Filled 2015-01-29 (×3): qty 1

## 2015-01-29 MED ORDER — LACTATED RINGERS IV SOLN
INTRAVENOUS | Status: DC | PRN
  Administered 2015-01-29 (×3): via INTRAVENOUS

## 2015-01-29 MED ORDER — LEVOTHYROXINE SODIUM 125 MCG PO TABS
125.0000 ug | ORAL_TABLET | Freq: Every day | ORAL | Status: DC
  Administered 2015-01-30 – 2015-02-01 (×3): 125 ug via ORAL
  Filled 2015-01-29 (×5): qty 1

## 2015-01-29 MED ORDER — ONDANSETRON HCL 4 MG/2ML IJ SOLN: 4.0000 mg | Freq: Four times a day (QID) | INTRAMUSCULAR | Status: DC | PRN

## 2015-01-29 MED ORDER — DEXAMETHASONE SODIUM PHOSPHATE 10 MG/ML IJ SOLN
INTRAMUSCULAR | Status: AC
Start: 2015-01-29 — End: 2015-01-29
  Filled 2015-01-29: qty 1

## 2015-01-29 MED ORDER — LIDOCAINE HCL (CARDIAC) 20 MG/ML IV SOLN
INTRAVENOUS | Status: DC | PRN
  Administered 2015-01-29: 50 mg via INTRAVENOUS

## 2015-01-29 MED ORDER — HYDROCODONE-ACETAMINOPHEN 7.5-325 MG PO TABS
1.0000 | ORAL_TABLET | ORAL | Status: DC
  Administered 2015-01-29 – 2015-01-31 (×6): 1 via ORAL
  Administered 2015-02-01: 2 via ORAL
  Filled 2015-01-29 (×10): qty 1

## 2015-01-29 MED ORDER — METHOCARBAMOL 1000 MG/10ML IJ SOLN
500.0000 mg | Freq: Four times a day (QID) | INTRAVENOUS | Status: DC | PRN
  Filled 2015-01-29: qty 5

## 2015-01-29 MED ORDER — ONDANSETRON HCL 4 MG/2ML IJ SOLN
INTRAMUSCULAR | Status: AC
  Filled 2015-01-29: qty 2

## 2015-01-29 MED ORDER — SODIUM CHLORIDE 0.9 % IV SOLN
100.0000 mL/h | INTRAVENOUS | Status: DC
  Administered 2015-01-29 – 2015-01-30 (×2): 100 mL/h via INTRAVENOUS
  Filled 2015-01-29 (×9): qty 1000

## 2015-01-29 MED ORDER — LIDOCAINE HCL (CARDIAC) 20 MG/ML IV SOLN
INTRAVENOUS | Status: AC
  Filled 2015-01-29: qty 5

## 2015-01-29 MED ORDER — DEXAMETHASONE SODIUM PHOSPHATE 10 MG/ML IJ SOLN: 10.0000 mg | Freq: Once | INTRAMUSCULAR | Status: DC

## 2015-01-29 MED ORDER — PHENYLEPHRINE HCL 10 MG/ML IJ SOLN
INTRAMUSCULAR | Status: AC
  Filled 2015-01-29: qty 1

## 2015-01-29 MED ORDER — HYDROMORPHONE HCL 1 MG/ML IJ SOLN
0.5000 mg | INTRAMUSCULAR | Status: DC | PRN
  Administered 2015-01-29: 0.5 mg via INTRAVENOUS
  Filled 2015-01-29: qty 1

## 2015-01-29 MED ORDER — FENTANYL CITRATE (PF) 100 MCG/2ML IJ SOLN
INTRAMUSCULAR | Status: AC
  Filled 2015-01-29: qty 2

## 2015-01-29 MED ORDER — CHLORHEXIDINE GLUCONATE 4 % EX LIQD: 60.0000 mL | Freq: Once | CUTANEOUS | Status: DC

## 2015-01-29 MED ORDER — METOCLOPRAMIDE HCL 10 MG PO TABS: 5.0000 mg | ORAL_TABLET | Freq: Three times a day (TID) | ORAL | Status: DC | PRN

## 2015-01-29 MED ORDER — SODIUM CHLORIDE 0.9 % IR SOLN
Status: DC | PRN
  Administered 2015-01-29: 1000 mL

## 2015-01-29 MED ORDER — RIVAROXABAN 10 MG PO TABS
10.0000 mg | ORAL_TABLET | Freq: Every evening | ORAL | Status: AC
  Administered 2015-01-30: 10 mg via ORAL
  Filled 2015-01-29: qty 1

## 2015-01-29 MED ORDER — ONDANSETRON HCL 4 MG/2ML IJ SOLN
INTRAMUSCULAR | Status: DC | PRN
  Administered 2015-01-29: 4 mg via INTRAVENOUS

## 2015-01-29 MED ORDER — BUPIVACAINE HCL (PF) 0.5 % IJ SOLN
INTRAMUSCULAR | Status: AC
  Filled 2015-01-29: qty 30

## 2015-01-29 MED ORDER — ALUM & MAG HYDROXIDE-SIMETH 200-200-20 MG/5ML PO SUSP: 30.0000 mL | ORAL | Status: DC | PRN

## 2015-01-29 MED ORDER — METOCLOPRAMIDE HCL 5 MG/ML IJ SOLN: 5.0000 mg | Freq: Three times a day (TID) | INTRAMUSCULAR | Status: DC | PRN

## 2015-01-29 MED ORDER — CEFAZOLIN SODIUM-DEXTROSE 2-3 GM-% IV SOLR
2.0000 g | INTRAVENOUS | Status: AC
  Administered 2015-01-29: 2 g via INTRAVENOUS

## 2015-01-29 MED ORDER — BISACODYL 10 MG RE SUPP
10.0000 mg | Freq: Every day | RECTAL | Status: DC | PRN
  Administered 2015-01-31: 10 mg via RECTAL
  Filled 2015-01-29: qty 1

## 2015-01-29 MED ORDER — TRANEXAMIC ACID 1000 MG/10ML IV SOLN
2000.0000 mg | INTRAVENOUS | Status: DC | PRN
  Administered 2015-01-29: 2000 mg via TOPICAL

## 2015-01-29 MED ORDER — POLYETHYLENE GLYCOL 3350 17 G PO PACK
17.0000 g | PACK | Freq: Two times a day (BID) | ORAL | Status: DC
  Administered 2015-01-29 – 2015-01-31 (×4): 17 g via ORAL

## 2015-01-29 MED ORDER — RIVAROXABAN 15 MG PO TABS: 15.0000 mg | ORAL_TABLET | Freq: Every evening | ORAL | Status: DC

## 2015-01-29 MED ORDER — DIPHENHYDRAMINE HCL 25 MG PO CAPS: 25.0000 mg | ORAL_CAPSULE | Freq: Four times a day (QID) | ORAL | Status: DC | PRN

## 2015-01-29 MED ORDER — PROPOFOL INFUSION 10 MG/ML OPTIME
INTRAVENOUS | Status: DC | PRN
  Administered 2015-01-29: 75 ug/kg/min via INTRAVENOUS

## 2015-01-29 MED ORDER — BISOPROLOL FUMARATE 10 MG PO TABS
10.0000 mg | ORAL_TABLET | Freq: Every day | ORAL | Status: DC
  Administered 2015-01-31 – 2015-02-01 (×2): 10 mg via ORAL
  Filled 2015-01-29 (×3): qty 1

## 2015-01-29 MED ORDER — PHENOL 1.4 % MT LIQD: 1.0000 | OROMUCOSAL | Status: DC | PRN

## 2015-01-29 MED ORDER — PROMETHAZINE HCL 25 MG/ML IJ SOLN
6.2500 mg | INTRAMUSCULAR | Status: DC | PRN
  Administered 2015-01-29: 6.25 mg via INTRAVENOUS

## 2015-01-29 MED ORDER — CELECOXIB 200 MG PO CAPS
200.0000 mg | ORAL_CAPSULE | Freq: Two times a day (BID) | ORAL | Status: DC
  Administered 2015-01-29 – 2015-02-01 (×6): 200 mg via ORAL
  Filled 2015-01-29 (×9): qty 1

## 2015-01-29 MED ORDER — PRAVASTATIN SODIUM 80 MG PO TABS
80.0000 mg | ORAL_TABLET | Freq: Every day | ORAL | Status: DC
  Administered 2015-01-29 – 2015-01-31 (×3): 80 mg via ORAL
  Filled 2015-01-29 (×4): qty 1

## 2015-01-29 MED ORDER — MEPERIDINE HCL 50 MG/ML IJ SOLN: 6.2500 mg | INTRAMUSCULAR | Status: DC | PRN

## 2015-01-29 MED ORDER — RIVAROXABAN 10 MG PO TABS
10.0000 mg | ORAL_TABLET | Freq: Once | ORAL | Status: AC
  Administered 2015-01-30: 10 mg via ORAL
  Filled 2015-01-29: qty 1

## 2015-01-29 SURGICAL SUPPLY — 42 items
BAG DECANTER FOR FLEXI CONT (MISCELLANEOUS) ×3 IMPLANT
BAG ZIPLOCK 12X15 (MISCELLANEOUS) IMPLANT
CAPT HIP TOTAL 2 ×3 IMPLANT
COVER PERINEAL POST (MISCELLANEOUS) ×3 IMPLANT
DRAPE C-ARM 42X120 X-RAY (DRAPES) ×3 IMPLANT
DRAPE STERI IOBAN 125X83 (DRAPES) ×3 IMPLANT
DRAPE U-SHAPE 47X51 STRL (DRAPES) ×9 IMPLANT
DRSG AQUACEL AG ADV 3.5X10 (GAUZE/BANDAGES/DRESSINGS) ×3 IMPLANT
DURAPREP 26ML APPLICATOR (WOUND CARE) ×3 IMPLANT
ELECT BLADE TIP CTD 4 INCH (ELECTRODE) ×3 IMPLANT
ELECT PENCIL ROCKER SW 15FT (MISCELLANEOUS) IMPLANT
ELECT REM PT RETURN 15FT ADLT (MISCELLANEOUS) IMPLANT
ELECT REM PT RETURN 9FT ADLT (ELECTROSURGICAL) ×3
ELECTRODE REM PT RTRN 9FT ADLT (ELECTROSURGICAL) ×1 IMPLANT
FACESHIELD WRAPAROUND (MASK) ×12 IMPLANT
GLOVE BIOGEL PI IND STRL 7.5 (GLOVE) ×1 IMPLANT
GLOVE BIOGEL PI IND STRL 8.5 (GLOVE) ×1 IMPLANT
GLOVE BIOGEL PI INDICATOR 7.5 (GLOVE) ×2
GLOVE BIOGEL PI INDICATOR 8.5 (GLOVE) ×2
GLOVE ECLIPSE 8.0 STRL XLNG CF (GLOVE) ×6 IMPLANT
GLOVE ORTHO TXT STRL SZ7.5 (GLOVE) ×3 IMPLANT
GOWN SPEC L3 XXLG W/TWL (GOWN DISPOSABLE) ×3 IMPLANT
GOWN STRL REUS W/TWL LRG LVL3 (GOWN DISPOSABLE) ×3 IMPLANT
HOLDER FOLEY CATH W/STRAP (MISCELLANEOUS) ×3 IMPLANT
KIT BASIN OR (CUSTOM PROCEDURE TRAY) ×3 IMPLANT
LIQUID BAND (GAUZE/BANDAGES/DRESSINGS) ×3 IMPLANT
NDL SAFETY ECLIPSE 18X1.5 (NEEDLE) ×1 IMPLANT
NEEDLE HYPO 18GX1.5 SHARP (NEEDLE) ×2
PACK TOTAL JOINT (CUSTOM PROCEDURE TRAY) ×3 IMPLANT
PEN SKIN MARKING BROAD (MISCELLANEOUS) ×3 IMPLANT
SAW OSC TIP CART 19.5X105X1.3 (SAW) ×3 IMPLANT
SUT MNCRL AB 4-0 PS2 18 (SUTURE) ×3 IMPLANT
SUT VIC AB 1 CT1 36 (SUTURE) ×12 IMPLANT
SUT VIC AB 2-0 CT1 27 (SUTURE) ×4
SUT VIC AB 2-0 CT1 TAPERPNT 27 (SUTURE) ×2 IMPLANT
SUT VLOC 180 0 24IN GS25 (SUTURE) ×3 IMPLANT
SYR 50ML LL SCALE MARK (SYRINGE) ×3 IMPLANT
TOWEL OR 17X26 10 PK STRL BLUE (TOWEL DISPOSABLE) ×3 IMPLANT
TOWEL OR NON WOVEN STRL DISP B (DISPOSABLE) IMPLANT
TRAY FOLEY W/METER SILVER 14FR (SET/KITS/TRAYS/PACK) ×3 IMPLANT
WATER STERILE IRR 1500ML POUR (IV SOLUTION) ×3 IMPLANT
YANKAUER SUCT BULB TIP 10FT TU (MISCELLANEOUS) ×3 IMPLANT

## 2015-01-29 NOTE — Op Note (Signed)
NAME:  Makayla Thomas                ACCOUNT NO.: [id number]      MEDICAL RECORD NO.: [id number]      FACILITY:  [facility]      PHYSICIAN:  Durene Romans D  DATE OF BIRTH:  07/22/1926     DATE OF PROCEDURE:  01/29/2015                                 OPERATIVE REPORT         PREOPERATIVE DIAGNOSIS: Right  hip osteoarthritis.      POSTOPERATIVE DIAGNOSIS:  Right hip osteoarthritis.      PROCEDURE:  Right total hip replacement through an anterior approach   utilizing DePuy THR system, component size 50mm pinnacle cup, a size 32+4 neutral   Altrex liner, a size 4 Hi Tri Lock stem with a 32+1 Articuleze metal head ball.      SURGEON:  Madlyn Frankel. Charlann Boxer, M.D.      ASSISTANT:  Skip Mayer, PA-C     ANESTHESIA:  Spinal.      SPECIMENS:  None.      COMPLICATIONS:  None.      BLOOD LOSS:  250 cc     DRAINS:  None.      INDICATION OF THE PROCEDURE:  Makayla Thomas is a 81 y.o. female who had   presented to office for evaluation of right hip pain.  Radiographs revealed   progressive degenerative changes with bone-on-bone   articulation to the  hip joint.  The patient had painful limited range of   motion significantly affecting their overall quality of life.  The patient was failing to    respond to conservative measures, and at this point was ready   to proceed with more definitive measures.  The patient has noted progressive   degenerative changes in his hip, progressive problems and dysfunction   with regarding the hip prior to surgery.  Consent was obtained for   benefit of pain relief.  Specific risk of infection, DVT, component   failure, dislocation, need for revision surgery, as well discussion of   the anterior versus posterior approach were reviewed.  Consent was   obtained for benefit of anterior pain relief through an anterior   approach.      PROCEDURE IN DETAIL:  The patient was brought to operative theater.   Once adequate anesthesia,  preoperative antibiotics, 2gm of Ancef and  of Decadron administered.   The patient was positioned supine on the OSI Hanna table.  Once adequate   padding of boney process was carried out, we had predraped out the hip, and  used fluoroscopy to confirm orientation of the pelvis and position.      The right hip was then prepped and draped from proximal iliac crest to   mid thigh with shower curtain technique.      Time-out was performed identifying the patient, planned procedure, and   extremity.     An incision was then made 2 cm distal and lateral to the   anterior superior iliac spine extending over the orientation of the   tensor fascia lata muscle and sharp dissection was carried down to the   fascia of the muscle and protractor placed in the soft tissues.      The fascia was then incised.  The muscle belly was identified and  swept   laterally and retractor placed along the superior neck.  Following   cauterization of the circumflex vessels and removing some pericapsular   fat, a second cobra retractor was placed on the inferior neck.  A third   retractor was placed on the anterior acetabulum after elevating the   anterior rectus.  A L-capsulotomy was along the line of the   superior neck to the trochanteric fossa, then extended proximally and   distally.  Tag sutures were placed and the retractors were then placed   intracapsular.  We then identified the trochanteric fossa and   orientation of my neck cut, confirmed this radiographically   and then made a neck osteotomy with the femur on traction.  The femoral   head was removed without difficulty or complication.  Traction was let   off and retractors were placed posterior and anterior around the   acetabulum.      The labrum and foveal tissue were debrided.  I began reaming with a 59mm   reamer and reamed up to 2mm reamer with good bony bed preparation and a 47mm   cup was chosen.  The final 30mm Pinnacle cup was then  impacted under fluoroscopy  to confirm the depth of penetration and orientation with respect to   abduction.  A screw was placed followed by the hole eliminator.  The final   32+4 neutral Altrex liner was impacted with good visualized rim fit.  The cup was positioned anatomically within the acetabular portion of the pelvis.      At this point, the femur was rolled at 80 degrees.  Further capsule was   released off the inferior aspect of the femoral neck.  I then   released the superior capsule proximally.  The hook was placed laterally   along the femur and elevated manually and held in position with the bed   hook.  The leg was then extended and adducted with the leg rolled to 100   degrees of external rotation.  Once the proximal femur was fully   exposed, I used a box osteotome to set orientation.  I then began   broaching with the starting chili pepper broach and passed this by hand and then broached up to 4.  With the 4 broach in place I chose a high offset neck and did a trial reduction.  The offset was appropriate, leg lengths   appeared to be equal, confirmed radiographically.   Given these findings, I went ahead and dislocated the hip, repositioned all   retractors and positioned the right hip in the extended and abducted position.  The final 4 Hi Tri Lock stem was   chosen and it was impacted down to the level of neck cut.  Based on this   and the trial reduction, a 32+1 Articuleze metal head ball was chosen and   impacted onto a clean and dry trunnion, and the hip was reduced.  The   hip had been irrigated throughout the case again at this point.  I did   reapproximate the superior capsular leaflet to the anterior leaflet   using #1 Vicryl.  The fascia of the   tensor fascia lata muscle was then reapproximated using #1 Vicryl and #0 V-lock sutures.  The   remaining wound was closed with 2-0 Vicryl and running 4-0 Monocryl.   The hip was cleaned, dried, and dressed sterilely using  Dermabond and   Aquacel dressing.  She was then brought  to recovery room in stable condition tolerating the procedure well.    Skip Mayer, PA-C was present for the entirety of the case involved from   preoperative positioning, perioperative retractor management, general   facilitation of the case, as well as primary wound closure as assistant.            Madlyn Frankel Charlann Boxer, M.D.        01/29/2015 10:01 AM

## 2015-01-29 NOTE — Discharge Instructions (Signed)

## 2015-01-29 NOTE — Interval H&P Note (Signed)
History and Physical Interval Note:  01/29/2015 6:50 AM  Makayla Thomas  has presented today for surgery, with the diagnosis of right hip osteoarthritis  The various methods of treatment have been discussed with the patient and family. After consideration of risks, benefits and other options for treatment, the patient has consented to  Procedure(s): RIGHT TOTAL HIP ARTHROPLASTY ANTERIOR APPROACH (Right) as a surgical intervention .  The patient's history has been reviewed, patient examined, no change in status, stable for surgery.  I have reviewed the patient's chart and labs.  Questions were answered to the patient's satisfaction.     [name]

## 2015-01-29 NOTE — Anesthesia Postprocedure Evaluation (Signed)
  Anesthesia Post-op Note  Patient: Makayla Thomas  Procedure(s) Performed: Procedure(s) (LRB): RIGHT TOTAL HIP ARTHROPLASTY ANTERIOR APPROACH (Right)  Patient Location: PACU  Anesthesia Type: Spinal  Level of Consciousness: awake and alert   Airway and Oxygen Therapy: Patient Spontanous Breathing  Post-op Pain: mild  Post-op Assessment: Post-op Vital signs reviewed, Patient's Cardiovascular Status Stable, Respiratory Function Stable, Patent Airway and No signs of Nausea or vomiting  Last Vitals:  Filed Vitals:   01/29/15 1539  BP: 133/59  Pulse: 60  Temp: 36.7 C  Resp: 14    Post-op Vital Signs: stable   Complications: No apparent anesthesia complications

## 2015-01-29 NOTE — Anesthesia Preprocedure Evaluation (Signed)
Anesthesia Evaluation  Patient identified by MRN, date of birth, ID band Patient awake    Reviewed: Allergy & Precautions, NPO status , Patient's Chart, lab work & pertinent test results  Airway Mallampati: II  TM Distance: >3 FB Neck ROM: Full    Dental no notable dental hx. (+) Edentulous Upper, Edentulous Lower   Pulmonary neg pulmonary ROS,  breath sounds clear to auscultation  Pulmonary exam normal       Cardiovascular hypertension, Pt. on medications Normal cardiovascular exam+ dysrhythmias Atrial Fibrillation Rhythm:Regular Rate:Normal     Neuro/Psych H/O multiple sclerosis TIAnegative psych ROS   GI/Hepatic negative GI ROS, Neg liver ROS,   Endo/Other  diabetes  Renal/GU negative Renal ROS  Female GU complaint  negative genitourinary   Musculoskeletal negative musculoskeletal ROS (+)   Abdominal   Peds negative pediatric ROS (+)  Hematology negative hematology ROS (+)   Anesthesia Other Findings   Reproductive/Obstetrics negative OB ROS                             Anesthesia Physical Anesthesia Plan  ASA: III  Anesthesia Plan: Spinal   Post-op Pain Management:    Induction:   Airway Management Planned: Simple Face Mask  Additional Equipment:   Intra-op Plan:   Post-operative Plan:   Informed Consent: I have reviewed the patients History and Physical, chart, labs and discussed the procedure including the risks, benefits and alternatives for the proposed anesthesia with the patient or authorized representative who has indicated his/her understanding and acceptance.   Dental advisory given  Plan Discussed with: CRNA  Anesthesia Plan Comments:         Anesthesia Quick Evaluation

## 2015-01-29 NOTE — Anesthesia Procedure Notes (Signed)
Spinal Patient location during procedure: OR End time: 01/29/2015 8:36 AM Staffing Resident/CRNA: Noralyn Pick D Performed by: anesthesiologist  Preanesthetic Checklist Completed: patient identified, site marked, surgical consent, pre-op evaluation, timeout performed, IV checked, risks and benefits discussed and monitors and equipment checked Spinal Block Patient position: sitting Prep: Betadine Patient monitoring: heart rate, continuous pulse ox and blood pressure Approach: right paramedian Location: L2-3 Injection technique: single-shot Needle Needle type: Spinocan  Needle gauge: 22 G Needle length: 9 cm Assessment Sensory level: T6 Additional Notes Expiration date of kit checked and confirmed. Patient tolerated procedure well, without complications.

## 2015-01-29 NOTE — Transfer of Care (Signed)
Immediate Anesthesia Transfer of Care Note  Patient: Makayla Thomas  Procedure(s) Performed: Procedure(s): RIGHT TOTAL HIP ARTHROPLASTY ANTERIOR APPROACH (Right)  Patient Location: PACU  Anesthesia Type:MAC and Spinal  Level of Consciousness: awake, alert  and oriented  Airway & Oxygen Therapy: Patient Spontanous Breathing and Patient connected to face mask oxygen  Post-op Assessment: Report given to RN and Post -op Vital signs reviewed and stable  Post vital signs: Reviewed and stable  Last Vitals:  Filed Vitals:   01/29/15 0611  BP: 105/80  Pulse: 64  Temp: 36.6 C  Resp: 16    Complications: No apparent anesthesia complications

## 2015-01-29 NOTE — Progress Notes (Signed)
PT Cancellation Note  Patient Details Name: Makayla Thomas MRN: [id number] DOB: [birth year]   Cancelled Treatment:    Reason Eval/Treat Not Completed: Pt with low BP and still drowsy from meds given earlier. Will hold PT on today (POD 0) and attempt eval on tomorrow.    [name], MPT Pager: [phone]

## 2015-01-30 ENCOUNTER — Encounter (HOSPITAL_COMMUNITY): Payer: Self-pay | Admitting: Orthopedic Surgery

## 2015-01-30 DIAGNOSIS — E669 Obesity, unspecified: Secondary | ICD-10-CM | POA: Diagnosis present

## 2015-01-30 LAB — CBC
HEMATOCRIT: 36.9 % (ref 36.0–46.0)
Hemoglobin: 11.5 g/dL — ABNORMAL LOW (ref 12.0–15.0)
MCH: 28 pg (ref 26.0–34.0)
MCHC: 31.2 g/dL (ref 30.0–36.0)
MCV: 89.8 fL (ref 78.0–100.0)
Platelets: 211 10*3/uL (ref 150–400)
RBC: 4.11 MIL/uL (ref 3.87–5.11)
RDW: 14.2 % (ref 11.5–15.5)
WBC: 15.6 10*3/uL — AB (ref 4.0–10.5)

## 2015-01-30 LAB — BASIC METABOLIC PANEL
Anion gap: 9 (ref 5–15)
BUN: 21 mg/dL — AB (ref 6–20)
CHLORIDE: 105 mmol/L (ref 101–111)
CO2: 26 mmol/L (ref 22–32)
Calcium: 8.6 mg/dL — ABNORMAL LOW (ref 8.9–10.3)
Creatinine, Ser: 0.82 mg/dL (ref 0.44–1.00)
GFR calc non Af Amer: >60 mL/min (ref >60)
Glucose, Bld: 163 mg/dL — ABNORMAL HIGH (ref 65–99)
POTASSIUM: 4.7 mmol/L (ref 3.5–5.1)
Sodium: 140 mmol/L (ref 135–145)

## 2015-01-30 NOTE — Progress Notes (Signed)
CSW spoke with RNCM from [facility] this am. RNCM reports pt will d/c home with [facility] following hospital d/c. CSW is available to assist with ST Rehab placement if plan changes.  Cori Razor LCSW [phone]

## 2015-01-30 NOTE — Evaluation (Signed)
Physical Therapy Evaluation Patient Details Name: Makayla Thomas MRN: [id number] DOB: 1925-11-23 Today's Date: 01/30/2015   History of Present Illness  81 yo female s/p R THA-direct anterior 01/29/15.   Clinical Impression  On eval, pt required Min assist for mobility-able to ambulate ~30 feet with RW. Discussed d/c plan-pt unable to report if she will have assist available at home or not. At this time, recommend HHPT and 24 hour supervision/assist. If pt will not have assist at home, may need to consider SNF.     Follow Up Recommendations Home health PT;Supervision/Assistance - 24 hour;SNF (depending on progress and assist available at home. )    Equipment Recommendations  Rolling walker with 5" wheels (if pt doesn't have already. )    Recommendations for Other Services OT consult     Precautions / Restrictions Precautions Precautions: Fall Restrictions Weight Bearing Restrictions: No RLE Weight Bearing: Weight bearing as tolerated      Mobility  Bed Mobility Overal bed mobility: Needs Assistance Bed Mobility: Supine to Sit     Supine to sit: Min assist     General bed mobility comments: assist for R LE  Transfers Overall transfer level: Needs assistance Equipment used: Rolling walker (2 wheeled) Transfers: Sit to/from Stand Sit to Stand: Min assist         General transfer comment: assist to rise, stabilize, control descent. VCs safety, technique, hand placement  Ambulation/Gait Ambulation/Gait assistance: Min assist Ambulation Distance (Feet): 30 Feet Assistive device: Rolling walker (2 wheeled) Gait Pattern/deviations: Step-to pattern;Antalgic;Decreased dorsiflexion - right     General Gait Details: VCs safety, technique, sequence. assist to stabilize and maneuver with walker.   Stairs            Wheelchair Mobility    Modified Rankin (Stroke Patients Only)       Balance Overall balance assessment: Needs assistance         Standing  balance support: Bilateral upper extremity supported;During functional activity Standing balance-Leahy Scale: Poor Standing balance comment: needs RW                             Pertinent Vitals/Pain Pain Assessment: 0-10 Pain Score: 2  Pain Location: R LE Pain Descriptors / Indicators: Sore Pain Intervention(s): Monitored during session;Ice applied;Repositioned    Home Living Family/patient expects to be discharged to:: Private residence Living Arrangements: Alone   Type of Home: Independent living facility Home Access: Elevator     Home Layout: One level Home Equipment: Bedside commode;Shower seat;Grab bars - tub/shower Additional Comments: states that she has people "on call"--vague about amount of help    Prior Function Level of Independence: Independent with assistive device(s)         Comments: uses rollator for ambulation and walks to dining hall for meals , but can independently fix her own meals and ADLS as well.  Plans to have meals brought to her room     Hand Dominance        Extremity/Trunk Assessment   Upper Extremity Assessment: Defer to OT evaluation RUE Deficits / Details: has pain in upper arm; no functional limitation today; states ROM variable         Lower Extremity Assessment: RLE deficits/detail RLE Deficits / Details: at least: hip flex 2/5, hip abd/add 2/5, moves ankle well.     Cervical / Trunk Assessment: Normal  Communication   Communication: No difficulties  Cognition Arousal/Alertness: Awake/alert Behavior During Therapy: WFL for  tasks assessed/performed Overall Cognitive Status: Within Functional Limits for tasks assessed                      General Comments      Exercises        Assessment/Plan    PT Assessment Patient needs continued PT services  PT Diagnosis Difficulty walking;Acute pain   PT Problem List Decreased strength;Decreased range of motion;Decreased activity tolerance;Decreased  balance;Decreased knowledge of use of DME;Decreased mobility;Pain  PT Treatment Interventions DME instruction;Gait training;Functional mobility training;Therapeutic activities;Therapeutic exercise;Patient/family education;Balance training   PT Goals (Current goals can be found in the Care Plan section) Acute Rehab PT Goals Patient Stated Goal: back to [facility] PT Goal Formulation: With patient Time For Goal Achievement: 02/06/15 Potential to Achieve Goals: Good    Frequency 7X/week   Barriers to discharge        Co-evaluation               End of Session Equipment Utilized During Treatment: Gait belt Activity Tolerance: Patient limited by fatigue;Patient limited by pain Patient left: in chair;with call bell/phone within reach           Time: 0383-3383 PT Time Calculation (min) (ACUTE ONLY): 26 min   Charges:   PT Evaluation $Initial PT Evaluation Tier I: 1 Procedure PT Treatments $Gait Training: 8-22 mins   PT G Codes:        [name], MPT Pager: [phone]

## 2015-01-30 NOTE — Evaluation (Signed)
Occupational Therapy Evaluation Patient Details Name: Makayla Thomas MRN: [id number] DOB: Jul 31, 1926 Today's Date: 01/30/2015    History of Present Illness s/p R DA THA   Clinical Impression   This 81 year old female was admitted for the above surgery.  She will benefit from skilled OT to increase safety and independence with adls.  Pt was mod I prior to admission.  She needs min to mod A at this time. Goals in acute are set for supervision level.    Follow Up Recommendations  Home health OT;SNF (vs) depending upon progress   Equipment Recommendations  None recommended by OT    Recommendations for Other Services       Precautions / Restrictions Precautions Precautions: Fall Restrictions Weight Bearing Restrictions: No      Mobility Bed Mobility               General bed mobility comments: oob  Transfers Overall transfer level: Needs assistance Equipment used: Rolling walker (2 wheeled) Transfers: Sit to/from Stand Sit to Stand: Min guard         General transfer comment: close guard for safety    Balance Overall balance assessment:  (uses rollator due to decreased balance at baseline)                                          ADL Overall ADL's : Needs assistance/impaired     Grooming: Set up;Sitting   Upper Body Bathing: Set up;Sitting   Lower Body Bathing: Minimal assistance;Sit to/from stand   Upper Body Dressing : Set up;Sitting   Lower Body Dressing: Moderate assistance;Sit to/from stand   Toilet Transfer: Minimal assistance;Stand-pivot (chair)   Toileting- Clothing Manipulation and Hygiene: Supervision/safety;Sitting/lateral lean         General ADL Comments: Pt was fatiqued--had PT earlier but willing to participate with OT.  Pt states that she has help "on call" when she goes home but she could not elaborate--waiting for one of the girls to call her back.  Her independent living [facility]) does not have  someone there to assist her.  She talked about having BSC by her bed, if needed.  She likes to wash her own sheets and underwear, and laundry room is nearby.  She does not want to ask family for assistance.  ?SNF for rehab prior to home; pt wants to see how she does.  Pt has 2 reachers but has not used them for ADLs.  Pt is familiar with sock aide, but hasn't used one before--will try on next visit.  Pt has balance difficulty at baseline, and is safety aware.  Will plan to have HHOT look at shower with her to see if she can safely step in forward using rails--backing up may not be a good idea     Vision     Perception     Praxis      Pertinent Vitals/Pain Pain Assessment: 0-10 Pain Score: 2  Pain Location: R hip and groin Pain Descriptors / Indicators: Sore Pain Intervention(s): Limited activity within patient's tolerance;Monitored during session;Premedicated before session;Repositioned     Hand Dominance     Extremity/Trunk Assessment Upper Extremity Assessment Upper Extremity Assessment: RUE deficits/detail RUE Deficits / Details: has pain in upper arm; no functional limitation today; states ROM variable           Communication Communication Communication: No difficulties   Cognition  Arousal/Alertness: Awake/alert Behavior During Therapy: WFL for tasks assessed/performed Overall Cognitive Status: Within Functional Limits for tasks assessed                     General Comments       Exercises       Shoulder Instructions      Home Living Family/patient expects to be discharged to:: Private residence Living Arrangements: Alone   Type of Home: Independent living facility Home Access: Elevator     Home Layout: One level     Bathroom Shower/Tub: Producer, television/film/video: Standard     Home Equipment: Bedside commode;Shower seat;Grab bars - tub/shower   Additional Comments: states that she has people "on call"--vague about amount of help       Prior Functioning/Environment Level of Independence: Independent with assistive device(s)        Comments: uses rollator for ambulation and walks to dining hall for meals , but can independently fix her own meals and ADLS as well.  Plans to have meals brought to her room    OT Diagnosis: Generalized weakness   OT Problem List: Decreased strength;Decreased activity tolerance;Impaired balance (sitting and/or standing);Decreased knowledge of use of DME or AE;Pain   OT Treatment/Interventions: Self-care/ADL training;DME and/or AE instruction;Patient/family education;Balance training    OT Goals(Current goals can be found in the care plan section) Acute Rehab OT Goals Patient Stated Goal: back to [facility] OT Goal Formulation: With patient Time For Goal Achievement: 02/06/15 Potential to Achieve Goals: Good ADL Goals Pt Will Perform Grooming: with supervision;standing Pt Will Perform Lower Body Bathing: with supervision;with adaptive equipment;sit to/from stand Pt Will Perform Lower Body Dressing: with supervision;with adaptive equipment;sit to/from stand Pt Will Transfer to Toilet: with supervision;ambulating;bedside commode Pt Will Perform Toileting - Clothing Manipulation and hygiene: with supervision;sitting/lateral leans  OT Frequency: Min 2X/week   Barriers to D/C:            Co-evaluation              End of Session    Activity Tolerance: Patient limited by fatigue Patient left: in chair;with call bell/phone within reach   Time: 6629-4765 OT Time Calculation (min): 27 min Charges:  OT General Charges $OT Visit: 1 Procedure OT Evaluation $Initial OT Evaluation Tier I: 1 Procedure OT Treatments $Therapeutic Activity: 8-22 mins G-Codes:    Makayla Thomas 03-Feb-2015, 10:25 AM  Makayla Thomas, OTR/L [phone] 02/03/15

## 2015-01-30 NOTE — Care Management Note (Signed)
Case Management Note  Patient Details  Name: Makayla Thomas MRN: [id number] Date of Birth: [birth year]  Subjective/Objective:                   RIGHT TOTAL HIP ARTHROPLASTY ANTERIOR APPROACH (Right) Action/Plan:  Discharge planning Expected Discharge Date:  01/31/15               Expected Discharge Plan:  [place]  In-House Referral:     Discharge planning Services  CM Consult  Post Acute Care Choice:  Home Health Choice offered to:  Patient  DME Arranged:    DME Agency:     HH Arranged:  PT, OT HH Agency:  [facility]  Status of Service:  Completed, signed off  Medicare Important Message Given:    Date Medicare IM Given:    Medicare IM give by:    Date Additional Medicare IM Given:    Additional Medicare Important Message give by:     If discussed at [place] of Stay Meetings, dates discussed:    Additional Comments: CM met with pt in room and pt states she lives at [place] and they use [facility].  CM called Alvis Lemmings and was transferred to [facility] in [place] and spoke with Malena Edman [phone] who requested I fax facesheet, F2F, orders, H&P, OP note, and PT/OT EVALs to [phone].  CM faxed the requested information and received verification of receipt.  Pt states she has all the DME needed.  No other CM needs were communicated.  Dellie Catholic, RN 01/30/2015, 3:04 PM

## 2015-01-30 NOTE — Progress Notes (Signed)
     Subjective: 1 Day Post-Op Procedure(s) (LRB): RIGHT TOTAL HIP ARTHROPLASTY ANTERIOR APPROACH (Right)   Patient reports pain as mild, pain controlled. No events throughout the night.   Objective:   VITALS:   Filed Vitals:   01/30/15 0535  BP: 116/61  Pulse: 65  Temp: 98.4 F (36.9 C)  Resp: 14    Dorsiflexion/Plantar flexion intact Incision: dressing C/D/I No cellulitis present Compartment soft  LABS  Recent Labs  01/30/15 0445  HGB 11.5*  HCT 36.9  WBC 15.6*  PLT 211     Recent Labs  01/30/15 0445  NA 140  K 4.7  BUN 21*  CREATININE 0.82  GLUCOSE 163*     Assessment/Plan: 1 Day Post-Op Procedure(s) (LRB): RIGHT TOTAL HIP ARTHROPLASTY ANTERIOR APPROACH (Right) Foley cath d/c'ed Advance diet Up with therapy D/C IV fluids Discharge home with home health eventually, when ready.  Obese (BMI 30-39.9) Estimated body mass index is 34.56 kg/(m^2) as calculated from the following:   Height as of this encounter: 5' 0.5" (1.537 m).   Weight as of this encounter: 81.647 kg (180 lb). Patient also counseled that weight may inhibit the healing process Patient counseled that losing weight will help with future health issues       Anastasio Auerbach. Arliss Hepburn   PAC  01/30/2015, 9:02 AM

## 2015-01-30 NOTE — Progress Notes (Addendum)
Physical Therapy Treatment Patient Details Name: Makayla Thomas MRN: [id number] DOB: Dec 08, 1925 Today's Date: 01/30/2015    History of Present Illness 81 yo female s/p R THA-direct anterior 01/29/15.     PT Comments    Progressing slowly with mobility. Noted R LE buckling during ambulation this session. Pt is at risk for falls. Feel pt could benefit from ST rehab at SNF however CM and SW state plan is for home. Highly recommend 24 hour supervision/assist of pt discharges home.  Follow Up Recommendations  Home health PT;Supervision/Assistance - 24 hour vs SNF (feel pt would benefit from [facility])     Equipment Recommendations  Rolling walker with 5" wheels    Recommendations for Other Services OT consult     Precautions / Restrictions Precautions Precautions: Fall Restrictions Weight Bearing Restrictions: No RLE Weight Bearing: Weight bearing as tolerated    Mobility  Bed Mobility Overal bed mobility: Needs Assistance Bed Mobility: Supine to Sit;Sit to Supine     Supine to sit: Min assist Sit to supine: Min assist   General bed mobility comments: assist for R LE  Transfers Overall transfer level: Needs assistance Equipment used: Rolling walker (2 wheeled) Transfers: Sit to/from Stand Sit to Stand: Min assist         General transfer comment: assist to rise, stabilize, control descent. VCs safety, technique, hand placement  Ambulation/Gait Ambulation/Gait assistance: Min assist Ambulation Distance (Feet): 65 Feet Assistive device: Rolling walker (2 wheeled) Gait Pattern/deviations: Step-to pattern;Antalgic     General Gait Details: VCs safety, technique, sequence, posture. assist to stabilize and maneuver with walker. Noted R LE buckling while ambulating. Several brief standing rest breaks needed. Fatigues fairly easily.    Stairs            Wheelchair Mobility    Modified Rankin (Stroke Patients Only)       Balance Overall balance  assessment: Needs assistance         Standing balance support: Bilateral upper extremity supported;During functional activity Standing balance-Leahy Scale: Poor Standing balance comment: needs RW                    Cognition Arousal/Alertness: Awake/Thomas Behavior During Therapy: WFL for tasks assessed/performed Overall Cognitive Status: Within Functional Limits for tasks assessed                      Exercises Total Joint Exercises-unable to complete ex program due to pain (appeared to be spasms) Ankle Circles/Pumps: AROM;Both;10 reps;Supine Quad Sets: AROM;Both;5 reps;Supine    General Comments        Pertinent Vitals/Pain Pain Assessment: Faces Pain Score: 2  Faces Pain Scale: Hurts even more Pain Location: R hip area-appeared to possibly be spasms-strong, intermittent Pain Descriptors / Indicators: Spasm;Sharp;Pressure Pain Intervention(s): Monitored during session;Repositioned    Home Living                      Prior Function            PT Goals (current goals can now be found in the care plan section) Acute Rehab PT Goals Patient Stated Goal: back to [place] PT Goal Formulation: With patient Time For Goal Achievement: 02/06/15 Potential to Achieve Goals: Good Progress towards PT goals: Progressing toward goals (slowly)    Frequency  7X/week    PT Plan Current plan remains appropriate    Co-evaluation  End of Session Equipment Utilized During Treatment: Gait belt Activity Tolerance: Patient limited by fatigue;Patient limited by pain Patient left: in bed;with call bell/phone within reach     Time: 1334-1403 PT Time Calculation (min) (ACUTE ONLY): 29 min  Charges:  $Gait Training: 23-37 mins                    G Codes:      Makayla Thomas, MPT Pager: [phone]

## 2015-01-31 LAB — CBC
HEMATOCRIT: 34 % — AB (ref 36.0–46.0)
Hemoglobin: 11.2 g/dL — ABNORMAL LOW (ref 12.0–15.0)
MCH: 30.3 pg (ref 26.0–34.0)
MCHC: 32.9 g/dL (ref 30.0–36.0)
MCV: 91.9 fL (ref 78.0–100.0)
Platelets: 212 10*3/uL (ref 150–400)
RBC: 3.7 MIL/uL — AB (ref 3.87–5.11)
RDW: 14.8 % (ref 11.5–15.5)
WBC: 12.9 10*3/uL — AB (ref 4.0–10.5)

## 2015-01-31 LAB — BASIC METABOLIC PANEL
Anion gap: 6 (ref 5–15)
BUN: 26 mg/dL — AB (ref 6–20)
CHLORIDE: 107 mmol/L (ref 101–111)
CO2: 26 mmol/L (ref 22–32)
CREATININE: 0.99 mg/dL (ref 0.44–1.00)
Calcium: 8.4 mg/dL — ABNORMAL LOW (ref 8.9–10.3)
GFR calc Af Amer: 58 mL/min — ABNORMAL LOW (ref >60)
GFR calc non Af Amer: 50 mL/min — ABNORMAL LOW (ref >60)
GLUCOSE: 128 mg/dL — AB (ref 65–99)
POTASSIUM: 4.8 mmol/L (ref 3.5–5.1)
Sodium: 139 mmol/L (ref 135–145)

## 2015-01-31 MED ORDER — ACETAMINOPHEN 325 MG PO TABS
650.0000 mg | ORAL_TABLET | Freq: Four times a day (QID) | ORAL | Status: DC | PRN
  Administered 2015-01-31 – 2015-02-01 (×3): 650 mg via ORAL
  Filled 2015-01-31 (×3): qty 2

## 2015-01-31 NOTE — Progress Notes (Signed)
Occupational Therapy Treatment Patient Details Name: Makayla Thomas MRN: [id number] DOB: 1926-05-13 Today's Date: 01/31/2015    History of present illness 81 yo female s/p R THA-direct anterior 01/29/15.    OT comments  Pt overall requires min assist for safety with functional transfers to the 3in1. She needs cues for safety with walker and to avoid obstacles especially in tight spaces. She requires min assist balance support for dynamic tasks such as performing toilet hygiene/washing periarea in standing. Pt recognizes that she would need more assist if she went home and is open to SNF option. Discussed d/c plans with son who has concerns about pt going home directly also. Will follow on acute. Feel she could benefit from SNF.    Follow Up Recommendations  SNF; Feel pt could benefit from SNF at d/c--if pt does d/c home, recommend Home health OT;Supervision/Assistance - 24 hour    Equipment Recommendations  3 in 1 bedside comode    Recommendations for Other Services      Precautions / Restrictions Precautions Precautions: Fall Restrictions Weight Bearing Restrictions: No RLE Weight Bearing: Weight bearing as tolerated       Mobility Bed Mobility               General bed mobility comments: in chair  Transfers Overall transfer level: Needs assistance Equipment used: Rolling walker (2 wheeled) Transfers: Sit to/from Stand Sit to Stand: Min guard         General transfer comment: verbal cues for hand placement. close guard for safety.     Balance                                   ADL               Lower Body Bathing: Sit to/from stand;Minimal assistance       Lower Body Dressing: Minimal assistance;Sitting/lateral leans;With adaptive equipment; don and doff socks with AE only.   Toilet Transfer: Minimal assistance;Ambulation;BSC;RW   Toileting- Clothing Manipulation and Hygiene: Minimal assistance;Sit to/from stand         General  ADL Comments: Son present for session. Pt practiced with reacher to doff R sock with min assist and cues for placement of reacher inside sock and technique to push off sock. Min assist to don sock with sock aid. Pt needs min assist to steady with functional transfers into the bathroom especially with turns and cues for safety to not tangle walker with legrests of 3in1. Pt tends to stand with LEs wide apart to perform washing of periarea but needs min assist for balance to do this. Discussed d/c plan and feel pt would benefit from SNF. Son is not sure if they would be able to provide 24/7 assist long enough to ensure safety for d/c back to independent living.       Vision                     Perception     Praxis      Cognition   Behavior During Therapy: [facility] for tasks assessed/performed Overall Cognitive Status: Within Functional Limits for tasks assessed                       Extremity/Trunk Assessment               Exercises     Shoulder Instructions  General Comments      Pertinent Vitals/ Pain       Pain Assessment: Faces Faces Pain Scale: Hurts a little bit Pain Location: R hip Pain Descriptors / Indicators: Sore Pain Intervention(s): Repositioned  Home Living                                          Prior Functioning/Environment              Frequency Min 2X/week     Progress Toward Goals  OT Goals(current goals can now be found in the care plan section)  Progress towards OT goals: Progressing toward goals     Plan Discharge plan remains appropriate    Co-evaluation                 End of Session Equipment Utilized During Treatment: Gait belt;Rolling walker   Activity Tolerance Patient tolerated treatment well   Patient Left in chair;with call bell/phone within reach   Nurse Communication          Time: 7846-9629 OT Time Calculation (min): 42 min  Charges: OT General Charges $OT Visit: 1  Procedure OT Treatments $Self Care/Home Management : 23-37 mins $Therapeutic Activity: 8-22 mins  Lennox Laity  [phone] 01/31/2015, 11:27 AM

## 2015-01-31 NOTE — Clinical Social Work Placement (Signed)
   CLINICAL SOCIAL WORK PLACEMENT  NOTE  Date:  01/31/2015  Patient Details  Name: Makayla Thomas MRN: [id number] Date of Birth: [birth year]  Clinical Social Work is seeking post-discharge placement for this patient at the Skilled  Nursing Facility level of care (*CSW will initial, date and re-position this form in  chart as items are completed):  Yes   Patient/family provided with [facility] Clinical Social Work Department's list of facilities offering this level of care within the geographic area requested by the patient (or if unable, by the patient's family).  Yes   Patient/family informed of their freedom to choose among providers that offer the needed level of care, that participate in Medicare, Medicaid or managed care program needed by the patient, have an available bed and are willing to accept the patient.  Yes   Patient/family informed of [facility]'s ownership interest in [facility] and [facility], as well as of the fact that they are under no obligation to receive care at these facilities.  PASRR submitted to EDS on 01/31/15     PASRR number received on 01/31/15     Existing PASRR number confirmed on       FL2 transmitted to all facilities in geographic area requested by pt/family on       FL2 transmitted to all facilities within larger geographic area on 01/31/15     Patient informed that his/her managed care company has contracts with or will negotiate with certain facilities, including the following:        Yes   Patient/family informed of bed offers received.  Patient chooses bed at       Physician recommends and patient chooses bed at      Patient to be transferred to   on  .  Patient to be transferred to facility by       Patient family notified on   of transfer.  Name of family member notified:        PHYSICIAN       Additional Comment:    _______________________________________________ Royetta Asal, LCSW [phone] 01/31/2015,  1:49 PM

## 2015-01-31 NOTE — Clinical Social Work Note (Signed)
Clinical Social Work Assessment  Patient Details  Name: Makayla Thomas MRN: [id number] Date of Birth: 16-May-1926  Date of referral:  01/31/15               Reason for consult:  Facility Placement, Discharge Planning                Permission sought to share information with:    Permission granted to share information::     Name::        Agency::     Relationship::     Contact Information:     Housing/Transportation Living arrangements for the past 2 months:  Apartment Source of Information:  Patient, Adult Children Patient Interpreter Needed:  None Criminal Activity/Legal Involvement Pertinent to Current Situation/Hospitalization:    Significant Relationships:  Adult Children Lives with:  Self Do you feel safe going back to the place where you live?   (ST Rehab may be needed.) Need for family participation in patient care:  Yes (Comment)  Care giving concerns: [name]   Social Worker assessment / plan:  Pt hospitalized on 01/29/15 for pre planned right total hip arthroplasty. CSW consulted for possible SNF placement. CSW met with pt / son / RNCM from [place] this am to assist with d/c planning. Pt originally planning to d/c home, following surgery, with [facility] services. PT / OT notes pt may benefit from [place] placement. Pt will remain inpt today and continue working with therapy. MD will see pt in am and determine disposition. Pt gave CSW permission to initiate SNF search in case rehab is needed. Pt indicated she was interested in placement at [facility]. SNF contacted and bed offer provided. CSW has updated pt. CSW continue to follow to assist with d/c planning needs.  Employment status:  Retired Forensic scientist:  Medicare PT Recommendations:  [place] / Referral to community resources:  [place]  Patient/Family's Response to care:  Pt / son feel ST Rehab is  needed.  Patient/Family's Understanding of and Emotional Response to Diagnosis, Current Treatment, and Prognosis:  Pt is motivated to work with PT. Pt has expressed some concern about returning to independent apt and would prefer a short stay at rehab before returning home. Son shares these  concerns. Disposition to be determined by MD in the am.  Emotional Assessment Appearance:  Appears stated age Attitude/Demeanor/Rapport:  Other (cooperative) Affect (typically observed):  Pleasant, Appropriate Orientation:  Oriented to Self, Oriented to Place, Oriented to  Time, Oriented to Situation Alcohol / Substance use:  Not Applicable Psych involvement (Current and /or in the community):  No (Comment)  Discharge Needs  Concerns to be addressed:  Discharge Planning Concerns Readmission within the last 30 days:  No Current discharge risk:  None Barriers to Discharge:  No Barriers Identified   Loraine Maple [phone] 01/31/2015, 1:34 PM

## 2015-01-31 NOTE — Progress Notes (Addendum)
Physical Therapy Treatment Patient Details Name: Makayla Thomas MRN: [id number] DOB: March 17, 1926 Today's Date: 01/31/2015    History of Present Illness 81 yo female s/p R THA-direct anterior 01/29/15.     PT Comments    Progressing slowly with mobility. Still note pt to have R LE buckling intermittently while ambulating. Pain better on today ~2/10 per pt. Discussed d/c plan-no decision has been made by pt/family at this time. Continue to recommend 24 hour supervision/assist if pt is to d/c home. Feel ST rehab at SNF would be beneficial prior to returning to Ind Living.  Follow Up Recommendations  Home health PT;Supervision/Assistance - 24 hour;SNF (depending on progress )     Equipment Recommendations  Rolling walker with 5" wheels    Recommendations for Other Services OT consult     Precautions / Restrictions Precautions Precautions: Fall Restrictions Weight Bearing Restrictions: No RLE Weight Bearing: Weight bearing as tolerated    Mobility  Bed Mobility               General bed mobility comments: in chair  Transfers Overall transfer level: Needs assistance Equipment used: Rolling walker (2 wheeled) Transfers: Sit to/from Stand Sit to Stand: Min guard         General transfer comment: verbal cues for hand placement. close guard for safety.   Ambulation/Gait Ambulation/Gait assistance: Min assist Ambulation Distance (Feet): 65 Feet Assistive device: Rolling walker (2 wheeled) Gait Pattern/deviations: Step-to pattern;Trunk flexed     General Gait Details: VCs safety, technique, sequence, posture. assist to stabilize and maneuver with walker. Noted intermittent R LE buckling while ambulating. Several brief standing rest breaks needed. Fatigues fairly easily.    Stairs            Wheelchair Mobility    Modified Rankin (Stroke Patients Only)       Balance                                    Cognition Arousal/Alertness:  Awake/alert Behavior During Therapy: WFL for tasks assessed/performed Overall Cognitive Status: Within Functional Limits for tasks assessed                      Exercises Total Joint Exercises Ankle Circles/Pumps: AROM;Both;Supine;15 reps Quad Sets: AROM;Both;Supine;15 reps Heel Slides: AAROM;Right;15 reps;Supine Hip ABduction/ADduction: AAROM;Right;15 reps;Supine Long Arc Quad: AROM;Right;15 reps;Seated    General Comments        Pertinent Vitals/Pain Pain Assessment: 0-10 Pain Score: 2  Faces Pain Scale: Hurts a little bit Pain Location: R hip Pain Descriptors / Indicators: Sore Pain Intervention(s): Monitored during session;Ice applied;Repositioned    Home Living                      Prior Function            PT Goals (current goals can now be found in the care plan section) Progress towards PT goals: Progressing toward goals    Frequency  7X/week    PT Plan Current plan remains appropriate    Co-evaluation             End of Session Equipment Utilized During Treatment: Gait belt Activity Tolerance: Patient limited by fatigue Patient left: in chair;with call bell/phone within reach;with family/visitor present     Time: 4098-1191 PT Time Calculation (min) (ACUTE ONLY): 35 min  Charges:  $Gait Training: 8-22 mins $Therapeutic Exercise: 8-22 mins  G Codes:      Weston Anna, MPT Pager: [phone]

## 2015-01-31 NOTE — Progress Notes (Signed)
     Subjective: 2 Days Post-Op Procedure(s) (LRB): RIGHT TOTAL HIP ARTHROPLASTY ANTERIOR APPROACH (Right)   Patient reports pain as mild, pain controlled. Some confusion this morning, but already feeling better. No other events throughout the night.   Objective:   VITALS:   Filed Vitals:   01/31/15 0508  BP: 121/64  Pulse: 64  Temp: 98.1 F (36.7 C)  Resp: 14    Dorsiflexion/Plantar flexion intact Incision: dressing C/D/I No cellulitis present Compartment soft  LABS  Recent Labs  01/30/15 0445 01/31/15 0440  HGB 11.5* 11.2*  HCT 36.9 34.0*  WBC 15.6* 12.9*  PLT 211 212     Recent Labs  01/30/15 0445 01/31/15 0440  NA 140 139  K 4.7 4.8  BUN 21* 26*  CREATININE 0.82 0.99  GLUCOSE 163* 128*     Assessment/Plan: 2 Days Post-Op Procedure(s) (LRB): RIGHT TOTAL HIP ARTHROPLASTY ANTERIOR APPROACH (Right) Place SW consult Up with therapy Discharge home with home health / SNF when ready       Anastasio Auerbach. Rayquan Amrhein   PAC  01/31/2015, 9:07 AM

## 2015-01-31 NOTE — Progress Notes (Signed)
Physical Therapy Treatment Patient Details Name: Makayla Thomas MRN: [id number] DOB: Jan 02, 1926 Today's Date: 01/31/2015    History of Present Illness 81 yo female s/p R THA-direct anterior 01/29/15.     PT Comments    R LE continues to buckle during ambulation-worse this session compared to am. Pt nearly did not make it back to bed, despite shorter distance this pm. Recommend ST rehab at SNF.   Follow Up Recommendations  SNF;Supervision/Assistance - 24 hour     Equipment Recommendations  Rolling walker with 5" wheels    Recommendations for Other Services OT consult     Precautions / Restrictions Precautions Precautions: Fall Restrictions Weight Bearing Restrictions: No RLE Weight Bearing: Weight bearing as tolerated    Mobility  Bed Mobility Overal bed mobility: Needs Assistance Bed Mobility: Sit to Supine       Sit to supine: Min assist   General bed mobility comments: Assist for R LE  Transfers Overall transfer level: Needs assistance Equipment used: Rolling walker (2 wheeled) Transfers: Sit to/from Stand Sit to Stand: Min guard         General transfer comment: verbal cues for hand placement. close guard for safety.   Ambulation/Gait Ambulation/Gait assistance: Min assist Ambulation Distance (Feet): 55 Feet Assistive device: Rolling walker (2 wheeled) Gait Pattern/deviations: Step-to pattern;Trunk flexed     General Gait Details: VCs safety, technique, sequence, posture. assist to stabilize and maneuver with walker. Noted R LE buckling while ambulating-worsened as distance progressed. Pt unable to walk as far as she did this am.  Several brief standing rest breaks needed. Fatigues fairly easily.    Stairs            Wheelchair Mobility    Modified Rankin (Stroke Patients Only)       Balance                                    Cognition Arousal/Alertness: Awake/alert Behavior During Therapy: WFL for tasks  assessed/performed Overall Cognitive Status: Within Functional Limits for tasks assessed                      Exercises Total Joint Exercises Ankle Circles/Pumps: AROM;Both;Supine;15 reps Quad Sets: AROM;Both;Supine;15 reps Heel Slides: AAROM;Right;15 reps;Supine Hip ABduction/ADduction: AAROM;Right;15 reps;Supine Long Arc Quad: AROM;Right;15 reps;Seated    General Comments        Pertinent Vitals/Pain Pain Assessment: 0-10 Pain Score: 2  Faces Pain Scale: Hurts a little bit Pain Location: R hip Pain Descriptors / Indicators: Sore Pain Intervention(s): Monitored during session;Ice applied;Repositioned    Home Living                      Prior Function            PT Goals (current goals can now be found in the care plan section) Progress towards PT goals: Progressing toward goals (slowly)    Frequency  7X/week    PT Plan Current plan remains appropriate    Co-evaluation             End of Session Equipment Utilized During Treatment: Gait belt Activity Tolerance: Patient limited by fatigue Patient left: in bed;with call bell/phone within reach;with family/visitor present     Time: 1355-1420 PT Time Calculation (min) (ACUTE ONLY): 25 min  Charges:  $Gait Training: 23-37 mins $Therapeutic Exercise: 8-22 mins  G Codes:      Weston Anna, MPT Pager: [phone]

## 2015-02-01 DIAGNOSIS — M62838 Other muscle spasm: Secondary | ICD-10-CM | POA: Diagnosis not present

## 2015-02-01 DIAGNOSIS — D649 Anemia, unspecified: Secondary | ICD-10-CM | POA: Diagnosis not present

## 2015-02-01 DIAGNOSIS — Z471 Aftercare following joint replacement surgery: Secondary | ICD-10-CM | POA: Diagnosis not present

## 2015-02-01 DIAGNOSIS — Z79899 Other long term (current) drug therapy: Secondary | ICD-10-CM | POA: Diagnosis not present

## 2015-02-01 DIAGNOSIS — Z6835 Body mass index (BMI) 35.0-35.9, adult: Secondary | ICD-10-CM | POA: Diagnosis not present

## 2015-02-01 DIAGNOSIS — I739 Peripheral vascular disease, unspecified: Secondary | ICD-10-CM | POA: Diagnosis present

## 2015-02-01 DIAGNOSIS — T859XXA Unspecified complication of internal prosthetic device, implant and graft, initial encounter: Secondary | ICD-10-CM | POA: Diagnosis not present

## 2015-02-01 DIAGNOSIS — Z887 Allergy status to serum and vaccine status: Secondary | ICD-10-CM | POA: Diagnosis not present

## 2015-02-01 DIAGNOSIS — Q253 Supravalvular aortic stenosis: Secondary | ICD-10-CM | POA: Diagnosis not present

## 2015-02-01 DIAGNOSIS — I350 Nonrheumatic aortic (valve) stenosis: Secondary | ICD-10-CM | POA: Diagnosis present

## 2015-02-01 DIAGNOSIS — N390 Urinary tract infection, site not specified: Secondary | ICD-10-CM | POA: Diagnosis not present

## 2015-02-01 DIAGNOSIS — M1611 Unilateral primary osteoarthritis, right hip: Secondary | ICD-10-CM | POA: Diagnosis not present

## 2015-02-01 DIAGNOSIS — E119 Type 2 diabetes mellitus without complications: Secondary | ICD-10-CM | POA: Diagnosis present

## 2015-02-01 DIAGNOSIS — Z7901 Long term (current) use of anticoagulants: Secondary | ICD-10-CM | POA: Diagnosis not present

## 2015-02-01 DIAGNOSIS — E785 Hyperlipidemia, unspecified: Secondary | ICD-10-CM | POA: Diagnosis not present

## 2015-02-01 DIAGNOSIS — R269 Unspecified abnormalities of gait and mobility: Secondary | ICD-10-CM | POA: Diagnosis not present

## 2015-02-01 DIAGNOSIS — K5900 Constipation, unspecified: Secondary | ICD-10-CM | POA: Diagnosis not present

## 2015-02-01 DIAGNOSIS — E569 Vitamin deficiency, unspecified: Secondary | ICD-10-CM | POA: Diagnosis not present

## 2015-02-01 DIAGNOSIS — I959 Hypotension, unspecified: Secondary | ICD-10-CM | POA: Diagnosis not present

## 2015-02-01 DIAGNOSIS — G8929 Other chronic pain: Secondary | ICD-10-CM | POA: Diagnosis not present

## 2015-02-01 DIAGNOSIS — X58XXXA Exposure to other specified factors, initial encounter: Secondary | ICD-10-CM | POA: Diagnosis present

## 2015-02-01 DIAGNOSIS — Z79891 Long term (current) use of opiate analgesic: Secondary | ICD-10-CM | POA: Diagnosis not present

## 2015-02-01 DIAGNOSIS — G35 Multiple sclerosis: Secondary | ICD-10-CM | POA: Diagnosis present

## 2015-02-01 DIAGNOSIS — B964 Proteus (mirabilis) (morganii) as the cause of diseases classified elsewhere: Secondary | ICD-10-CM | POA: Diagnosis present

## 2015-02-01 DIAGNOSIS — Z96641 Presence of right artificial hip joint: Secondary | ICD-10-CM | POA: Diagnosis not present

## 2015-02-01 DIAGNOSIS — M25551 Pain in right hip: Secondary | ICD-10-CM | POA: Diagnosis not present

## 2015-02-01 DIAGNOSIS — M79606 Pain in leg, unspecified: Secondary | ICD-10-CM | POA: Diagnosis not present

## 2015-02-01 DIAGNOSIS — Z8673 Personal history of transient ischemic attack (TIA), and cerebral infarction without residual deficits: Secondary | ICD-10-CM | POA: Diagnosis not present

## 2015-02-01 DIAGNOSIS — M1990 Unspecified osteoarthritis, unspecified site: Secondary | ICD-10-CM | POA: Diagnosis present

## 2015-02-01 DIAGNOSIS — T148 Other injury of unspecified body region: Secondary | ICD-10-CM | POA: Diagnosis not present

## 2015-02-01 DIAGNOSIS — Z888 Allergy status to other drugs, medicaments and biological substances status: Secondary | ICD-10-CM | POA: Diagnosis not present

## 2015-02-01 DIAGNOSIS — N3000 Acute cystitis without hematuria: Secondary | ICD-10-CM | POA: Diagnosis not present

## 2015-02-01 DIAGNOSIS — M6281 Muscle weakness (generalized): Secondary | ICD-10-CM | POA: Diagnosis not present

## 2015-02-01 DIAGNOSIS — M25552 Pain in left hip: Secondary | ICD-10-CM | POA: Diagnosis not present

## 2015-02-01 DIAGNOSIS — Z8249 Family history of ischemic heart disease and other diseases of the circulatory system: Secondary | ICD-10-CM | POA: Diagnosis not present

## 2015-02-01 DIAGNOSIS — I482 Chronic atrial fibrillation: Secondary | ICD-10-CM | POA: Diagnosis not present

## 2015-02-01 DIAGNOSIS — I4891 Unspecified atrial fibrillation: Secondary | ICD-10-CM | POA: Diagnosis not present

## 2015-02-01 DIAGNOSIS — R509 Fever, unspecified: Secondary | ICD-10-CM | POA: Diagnosis not present

## 2015-02-01 DIAGNOSIS — A047 Enterocolitis due to Clostridium difficile: Secondary | ICD-10-CM | POA: Diagnosis not present

## 2015-02-01 DIAGNOSIS — R112 Nausea with vomiting, unspecified: Secondary | ICD-10-CM | POA: Diagnosis not present

## 2015-02-01 DIAGNOSIS — D62 Acute posthemorrhagic anemia: Secondary | ICD-10-CM | POA: Diagnosis not present

## 2015-02-01 DIAGNOSIS — T84040A Periprosthetic fracture around internal prosthetic right hip joint, initial encounter: Secondary | ICD-10-CM | POA: Diagnosis not present

## 2015-02-01 DIAGNOSIS — D500 Iron deficiency anemia secondary to blood loss (chronic): Secondary | ICD-10-CM | POA: Diagnosis present

## 2015-02-01 DIAGNOSIS — E039 Hypothyroidism, unspecified: Secondary | ICD-10-CM | POA: Diagnosis present

## 2015-02-01 DIAGNOSIS — N179 Acute kidney failure, unspecified: Secondary | ICD-10-CM | POA: Diagnosis not present

## 2015-02-01 DIAGNOSIS — S72111A Displaced fracture of greater trochanter of right femur, initial encounter for closed fracture: Secondary | ICD-10-CM | POA: Diagnosis present

## 2015-02-01 DIAGNOSIS — I10 Essential (primary) hypertension: Secondary | ICD-10-CM | POA: Diagnosis not present

## 2015-02-01 MED ORDER — TIZANIDINE HCL 4 MG PO TABS: 4.0000 mg | ORAL_TABLET | Freq: Four times a day (QID) | ORAL | Status: DC | PRN

## 2015-02-01 MED ORDER — TRAMADOL HCL 50 MG PO TABS: 50.0000 mg | ORAL_TABLET | Freq: Four times a day (QID) | ORAL | Status: DC | PRN

## 2015-02-01 MED ORDER — POLYETHYLENE GLYCOL 3350 17 G PO PACK: 17.0000 g | PACK | Freq: Two times a day (BID) | ORAL | Status: DC

## 2015-02-01 MED ORDER — ACETAMINOPHEN 325 MG PO TABS: 325.0000 mg | ORAL_TABLET | Freq: Four times a day (QID) | ORAL | Status: DC | PRN

## 2015-02-01 MED ORDER — FERROUS SULFATE 325 (65 FE) MG PO TABS: 325.0000 mg | ORAL_TABLET | Freq: Three times a day (TID) | ORAL | Status: DC

## 2015-02-01 MED ORDER — DOCUSATE SODIUM 100 MG PO CAPS: 100.0000 mg | ORAL_CAPSULE | Freq: Two times a day (BID) | ORAL | Status: DC

## 2015-02-01 NOTE — Progress Notes (Signed)
     Subjective: 3 Days Post-Op Procedure(s) (LRB): RIGHT TOTAL HIP ARTHROPLASTY ANTERIOR APPROACH (Right)   Patient reports pain as mild, pain controlled. No events throughout the night. Continues to be a little unsteady and slow with PT. Will benefit from a short stay at a skilled nursing facility.  Objective:   VITALS:   Filed Vitals:   02/01/15 0555  BP: 108/56  Pulse: 63  Temp: 97.5 F (36.4 C)  Resp: 14    Dorsiflexion/Plantar flexion intact Incision: dressing C/D/I No cellulitis present Compartment soft  LABS  Recent Labs  01/30/15 0445 01/31/15 0440  HGB 11.5* 11.2*  HCT 36.9 34.0*  WBC 15.6* 12.9*  PLT 211 212     Recent Labs  01/30/15 0445 01/31/15 0440  NA 140 139  K 4.7 4.8  BUN 21* 26*  CREATININE 0.82 0.99  GLUCOSE 163* 128*     Assessment/Plan: 3 Days Post-Op Procedure(s) (LRB): RIGHT TOTAL HIP ARTHROPLASTY ANTERIOR APPROACH (Right) Up with therapy Discharge to SNF  [facility] in 2 [facility] at [facility]. Follow up with OLIN,Jaemarie Hochberg D in 2 weeks.  Contact information:  [facility] [address] [place] [address] [phone]        Anastasio Auerbach. Hayly Litsey   PAC  02/01/2015, 8:38 AM

## 2015-02-01 NOTE — Progress Notes (Signed)
Physical Therapy Treatment Patient Details Name: Makayla Thomas MRN: [id number] DOB: 08/06/1926 Today's Date: 02/01/2015    History of Present Illness 81 yo female s/p R THA-direct anterior 01/29/15.     PT Comments    Progressing with mobility. Plan is for SNF  Follow Up Recommendations  SNF;Supervision/Assistance - 24 hour     Equipment Recommendations  Rolling walker with 5" wheels    Recommendations for Other Services OT consult     Precautions / Restrictions Precautions Precautions: Fall Restrictions Weight Bearing Restrictions: No RLE Weight Bearing: Weight bearing as tolerated    Mobility  Bed Mobility Overal bed mobility: Needs Assistance Bed Mobility: Sit to Supine       Sit to supine: Min assist   General bed mobility comments: Assist for R LE  Transfers Overall transfer level: Needs assistance Equipment used: Rolling walker (2 wheeled) Transfers: Sit to/from Stand Sit to Stand: Min guard         General transfer comment: verbal cues for hand placement. close guard for safety.   Ambulation/Gait Ambulation/Gait assistance: Min assist Ambulation Distance (Feet): 55 Feet Assistive device: Rolling walker (2 wheeled) Gait Pattern/deviations: Step-to pattern;Trunk flexed     General Gait Details: VCs safety, technique, sequence, posture. assist to stabilize and maneuver with walker. Noted intermittent R LE buckling while ambulating. Fatigues fairly easily.    Stairs            Wheelchair Mobility    Modified Rankin (Stroke Patients Only)       Balance                                    Cognition Arousal/Alertness: Awake/alert Behavior During Therapy: WFL for tasks assessed/performed Overall Cognitive Status: Within Functional Limits for tasks assessed                      Exercises Total Joint Exercises Ankle Circles/Pumps: AROM;Both;10 reps;Supine Quad Sets: AROM;Both;10 reps;Supine Heel Slides:  AAROM;Right;10 reps;Supine Hip ABduction/ADduction: AAROM;Right;10 reps;Supine    General Comments        Pertinent Vitals/Pain Pain Assessment: 0-10 Pain Score: 1  Pain Location: R hip Pain Descriptors / Indicators: Sore Pain Intervention(s): Monitored during session;Ice applied;Repositioned    Home Living                      Prior Function            PT Goals (current goals can now be found in the care plan section) Progress towards PT goals: Progressing toward goals    Frequency  7X/week    PT Plan Current plan remains appropriate    Co-evaluation             End of Session Equipment Utilized During Treatment: Gait belt Activity Tolerance: Patient tolerated treatment well Patient left: in bed;with call bell/phone within reach     Time: 0930-0958 PT Time Calculation (min) (ACUTE ONLY): 28 min  Charges:  $Gait Training: 8-22 mins $Therapeutic Exercise: 8-22 mins                    G Codes:      [name], MPT Pager: [phone]

## 2015-02-01 NOTE — Discharge Summary (Signed)
Physician Discharge Summary  Patient ID: Che Blatchford MRN: [id number] DOB/AGE: 1926/01/23 81 y.o.  Admit date: 01/29/2015 Discharge date:  02/01/2015  Procedures:  Procedure(s) (LRB): RIGHT TOTAL HIP ARTHROPLASTY ANTERIOR APPROACH (Right)  Attending Physician:  Dr. Durene Romans   Admission Diagnoses:   Right hip primary OA / pain  Discharge Diagnoses:  Principal Problem:   S/P right THA, AA Active Problems:   Obese  Past Medical History  Diagnosis Date  . Permanent atrial fibrillation   . Chronic anticoagulation     on Xarelto  . Hypothyroidism   . Dyslipidemia     treated  . PAD (peripheral artery disease) 08/30/2009    Dopplers; with bil. post. tib artery occlusion  . Lower extremity edema     chronic  . Aortic stenosis, mild 08/2009    ECHO  EF 55-60%, wth increased EDP, mild   . H/O cardiovascular stress test 05/2010    Lexiscan cardiolite no ischemia or infarction  . History of ETT 02/2011    Naughton exercise protocol, negative with poor exercise tolerance  . H/O multiple sclerosis     no excaerbations in some time  . Complication of anesthesia     hallusinations  . Dysrhythmia     A-FIB  . Visual disturbances     "FLASHING IN MY EYES"  . Arthritis   . Vaginal itching   . Constipation   . Anemia   . History of transfusion   . Borderline diabetes   . Transient ischemic attack (TIA)     NO RESIDUAL PROBLEMS  . Diabetes mellitus without complication     possibly    HPI:    Makayla Thomas, 81 y.o. female, has a history of pain and functional disability in the right hip(s) due to arthritis and patient has failed non-surgical conservative treatments for greater than 12 weeks to include NSAID's and/or analgesics, use of assistive devices and activity modification. Onset of symptoms was gradual starting >10 years ago with gradually worsening course since that time.The patient noted no past surgery on the right hip(s). Patient currently rates pain in the  right hip at 8 out of 10 with activity. Patient has night pain, worsening of pain with activity and weight bearing, trendelenberg gait, pain that interfers with activities of daily living and pain with passive range of motion. Patient has evidence of periarticular osteophytes and joint space narrowing by imaging studies. This condition presents safety issues increasing the risk of falls. There is no current active infection. Risks, benefits and expectations were discussed with the patient. Risks including but not limited to the risk of anesthesia, blood clots, nerve damage, blood vessel damage, failure of the prosthesis, infection and up to and including death. Patient understand the risks, benefits and expectations and wishes to proceed with surgery.   PCP: Londell Moh, MD   Discharged Condition: good  Hospital Course:  Patient underwent the above stated procedure on 01/29/2015. Patient tolerated the procedure well and brought to the recovery room in good condition and subsequently to the floor.  POD #1 BP: 116/61 ; Pulse: 65 ; Temp: 98.4 F (36.9 C) ; Resp: 14 Patient reports pain as mild, pain controlled. No events throughout the night.  Dorsiflexion/plantar flexion intact, incision: dressing C/D/I, no cellulitis present and compartment soft.   LABS  Basename    HGB  11.5  HCT  36.9   POD #2  BP: 121/64 ; Pulse: 64 ; Temp: 98.1 F (36.7 C) ; Resp: 14 Patient reports  pain as mild, pain controlled. Some confusion this morning, but already feeling better. No other events throughout the night. Dorsiflexion/plantar flexion intact, incision: dressing C/D/I, no cellulitis present and compartment soft.   LABS  Basename    HGB  11.2  HCT  34.0   POD #3  BP: 108/56 ; Pulse: 63 ; Temp: 97.5 F (36.4 C) ; Resp: 14 Patient reports pain as mild, pain controlled. No events throughout the night. Continues to be a little unsteady and slow with PT. Will benefit from a short stay at a  skilled nursing facility. Dorsiflexion/plantar flexion intact, incision: dressing C/D/I, no cellulitis present and compartment soft.   LABS   No new labs  Discharge Exam: General appearance: alert, cooperative and no distress Extremities: Homans sign is negative, no sign of DVT, no edema, redness or tenderness in the calves or thighs and no ulcers, gangrene or trophic changes  Disposition: Skilled nursing facility with follow up in 2 weeks   Follow-up Information    Follow up with [name], MD. Schedule an appointment as soon as possible for a visit in 2 weeks.   Specialty:  Orthopedic Surgery   Contact information:   [address] [place] [address] [phone]       Follow up with [facility].   Specialty:  Home Health Services   Why:  home health physical therapy and occupational therapy   Contact information:   [address] [place] [address] [phone]           Medication List    TAKE these medications        acetaminophen 325 MG tablet  Commonly known as:  TYLENOL  Take 1-2 tablets (325-650 mg total) by mouth every 6 (six) hours as needed for moderate pain.     bisoprolol 10 MG tablet  Commonly known as:  ZEBETA  Take 1 tablet (10 mg total) by mouth daily.     cholecalciferol 1000 UNITS tablet  Commonly known as:  VITAMIN D  Take 1,000 Units by mouth every evening.     diltiazem 120 MG 24 hr capsule  Commonly known as:  CARDIZEM CD  Take 120 mg by mouth daily.     docusate sodium 100 MG capsule  Commonly known as:  COLACE  Take 1 capsule (100 mg total) by mouth 2 (two) times daily.     ferrous sulfate 325 (65 FE) MG tablet  Take 1 tablet (325 mg total) by mouth 3 (three) times daily after meals.     furosemide 20 MG tablet  Commonly known as:  LASIX  Take 20 mg by mouth every morning.     Pitavastatin Calcium 4 MG Tabs  Take 1 mg by mouth every evening.     polyethylene glycol  packet  Commonly known as:  MIRALAX / GLYCOLAX  Take 17 g by mouth 2 (two) times daily.     SYNTHROID 125 MCG tablet  Generic drug:  levothyroxine  Take 125 mcg by mouth daily.     tiZANidine 4 MG tablet  Commonly known as:  ZANAFLEX  Take 1 tablet (4 mg total) by mouth every 6 (six) hours as needed for muscle spasms.     traMADol 50 MG tablet  Commonly known as:  ULTRAM  Take 1 tablet (50 mg total) by mouth every 6 (six) hours as needed.     triamterene-hydrochlorothiazide 37.5-25 MG per tablet  Commonly known as:  MAXZIDE-25  Take 1 tablet by mouth every morning.     XARELTO 15 MG Tabs tablet  Generic drug:  Rivaroxaban  Take 15 mg by mouth every evening.         Signed: Anastasio Auerbach. Stephenson Cichy   PA-C  02/01/2015, 8:57 AM

## 2015-02-03 DIAGNOSIS — I4891 Unspecified atrial fibrillation: Secondary | ICD-10-CM | POA: Diagnosis not present

## 2015-02-03 DIAGNOSIS — Z96641 Presence of right artificial hip joint: Secondary | ICD-10-CM | POA: Diagnosis not present

## 2015-02-03 DIAGNOSIS — Q253 Supravalvular aortic stenosis: Secondary | ICD-10-CM | POA: Diagnosis not present

## 2015-02-04 ENCOUNTER — Inpatient Hospital Stay (HOSPITAL_COMMUNITY)
Admission: EM | Admit: 2015-02-04 | Discharge: 2015-02-12 | DRG: 467 | Disposition: A | Discharge status: Skilled Nursing Facility | Payer: Medicare Other | Attending: Internal Medicine | Admitting: Internal Medicine

## 2015-02-04 ENCOUNTER — Encounter (HOSPITAL_COMMUNITY): Payer: Self-pay | Admitting: Emergency Medicine

## 2015-02-04 DIAGNOSIS — T859XXA Unspecified complication of internal prosthetic device, implant and graft, initial encounter: Secondary | ICD-10-CM | POA: Diagnosis not present

## 2015-02-04 DIAGNOSIS — I739 Peripheral vascular disease, unspecified: Secondary | ICD-10-CM | POA: Diagnosis present

## 2015-02-04 DIAGNOSIS — I350 Nonrheumatic aortic (valve) stenosis: Secondary | ICD-10-CM | POA: Diagnosis present

## 2015-02-04 DIAGNOSIS — B964 Proteus (mirabilis) (morganii) as the cause of diseases classified elsewhere: Secondary | ICD-10-CM | POA: Diagnosis present

## 2015-02-04 DIAGNOSIS — S72001A Fracture of unspecified part of neck of right femur, initial encounter for closed fracture: Secondary | ICD-10-CM | POA: Diagnosis not present

## 2015-02-04 DIAGNOSIS — X58XXXA Exposure to other specified factors, initial encounter: Secondary | ICD-10-CM | POA: Diagnosis present

## 2015-02-04 DIAGNOSIS — I959 Hypotension, unspecified: Secondary | ICD-10-CM | POA: Diagnosis not present

## 2015-02-04 DIAGNOSIS — W19XXXA Unspecified fall, initial encounter: Secondary | ICD-10-CM

## 2015-02-04 DIAGNOSIS — I10 Essential (primary) hypertension: Secondary | ICD-10-CM | POA: Diagnosis present

## 2015-02-04 DIAGNOSIS — E785 Hyperlipidemia, unspecified: Secondary | ICD-10-CM | POA: Diagnosis present

## 2015-02-04 DIAGNOSIS — E119 Type 2 diabetes mellitus without complications: Secondary | ICD-10-CM | POA: Diagnosis present

## 2015-02-04 DIAGNOSIS — D500 Iron deficiency anemia secondary to blood loss (chronic): Secondary | ICD-10-CM | POA: Diagnosis present

## 2015-02-04 DIAGNOSIS — S72111A Displaced fracture of greater trochanter of right femur, initial encounter for closed fracture: Principal | ICD-10-CM | POA: Diagnosis present

## 2015-02-04 DIAGNOSIS — R52 Pain, unspecified: Secondary | ICD-10-CM

## 2015-02-04 DIAGNOSIS — Z6835 Body mass index (BMI) 35.0-35.9, adult: Secondary | ICD-10-CM

## 2015-02-04 DIAGNOSIS — N390 Urinary tract infection, site not specified: Secondary | ICD-10-CM | POA: Diagnosis present

## 2015-02-04 DIAGNOSIS — I482 Chronic atrial fibrillation, unspecified: Secondary | ICD-10-CM | POA: Diagnosis present

## 2015-02-04 DIAGNOSIS — Z7901 Long term (current) use of anticoagulants: Secondary | ICD-10-CM

## 2015-02-04 DIAGNOSIS — M25551 Pain in right hip: Secondary | ICD-10-CM | POA: Diagnosis not present

## 2015-02-04 DIAGNOSIS — D62 Acute posthemorrhagic anemia: Secondary | ICD-10-CM | POA: Diagnosis not present

## 2015-02-04 DIAGNOSIS — Z888 Allergy status to other drugs, medicaments and biological substances status: Secondary | ICD-10-CM

## 2015-02-04 DIAGNOSIS — A047 Enterocolitis due to Clostridium difficile: Secondary | ICD-10-CM | POA: Diagnosis not present

## 2015-02-04 DIAGNOSIS — G35 Multiple sclerosis: Secondary | ICD-10-CM | POA: Diagnosis not present

## 2015-02-04 DIAGNOSIS — Z8249 Family history of ischemic heart disease and other diseases of the circulatory system: Secondary | ICD-10-CM

## 2015-02-04 DIAGNOSIS — Z887 Allergy status to serum and vaccine status: Secondary | ICD-10-CM

## 2015-02-04 DIAGNOSIS — Z79899 Other long term (current) drug therapy: Secondary | ICD-10-CM

## 2015-02-04 DIAGNOSIS — R509 Fever, unspecified: Secondary | ICD-10-CM

## 2015-02-04 DIAGNOSIS — M9701XA Periprosthetic fracture around internal prosthetic right hip joint, initial encounter: Secondary | ICD-10-CM

## 2015-02-04 DIAGNOSIS — Z8673 Personal history of transient ischemic attack (TIA), and cerebral infarction without residual deficits: Secondary | ICD-10-CM

## 2015-02-04 DIAGNOSIS — I517 Cardiomegaly: Secondary | ICD-10-CM | POA: Diagnosis not present

## 2015-02-04 DIAGNOSIS — M1990 Unspecified osteoarthritis, unspecified site: Secondary | ICD-10-CM | POA: Diagnosis present

## 2015-02-04 DIAGNOSIS — Z419 Encounter for procedure for purposes other than remedying health state, unspecified: Secondary | ICD-10-CM

## 2015-02-04 DIAGNOSIS — Z96641 Presence of right artificial hip joint: Secondary | ICD-10-CM

## 2015-02-04 DIAGNOSIS — Z79891 Long term (current) use of opiate analgesic: Secondary | ICD-10-CM

## 2015-02-04 DIAGNOSIS — E039 Hypothyroidism, unspecified: Secondary | ICD-10-CM | POA: Diagnosis present

## 2015-02-04 NOTE — ED Notes (Signed)
Per EMS pt had right hip replacement on the 14th of June and was put in [facility] for rehab  Sunday pt started c/o pain to her right leg with decreased mobility  Had an xray today that showed a proximal right femur fracture  Pt has shortening and external rotation noted  IV was placed and pt was given fentanyl 100 mcg per EMS  Pt denies pain at this time  Pt has strong pedal pulse noted to right foot

## 2015-02-05 ENCOUNTER — Emergency Department (HOSPITAL_COMMUNITY): Payer: Medicare Other

## 2015-02-05 ENCOUNTER — Encounter (HOSPITAL_COMMUNITY): Payer: Self-pay | Admitting: Internal Medicine

## 2015-02-05 DIAGNOSIS — I959 Hypotension, unspecified: Secondary | ICD-10-CM | POA: Diagnosis not present

## 2015-02-05 DIAGNOSIS — M6281 Muscle weakness (generalized): Secondary | ICD-10-CM | POA: Diagnosis not present

## 2015-02-05 DIAGNOSIS — D500 Iron deficiency anemia secondary to blood loss (chronic): Secondary | ICD-10-CM | POA: Diagnosis present

## 2015-02-05 DIAGNOSIS — Z8673 Personal history of transient ischemic attack (TIA), and cerebral infarction without residual deficits: Secondary | ICD-10-CM | POA: Diagnosis not present

## 2015-02-05 DIAGNOSIS — Z8249 Family history of ischemic heart disease and other diseases of the circulatory system: Secondary | ICD-10-CM | POA: Diagnosis not present

## 2015-02-05 DIAGNOSIS — R278 Other lack of coordination: Secondary | ICD-10-CM | POA: Diagnosis not present

## 2015-02-05 DIAGNOSIS — Z887 Allergy status to serum and vaccine status: Secondary | ICD-10-CM | POA: Diagnosis not present

## 2015-02-05 DIAGNOSIS — Z79891 Long term (current) use of opiate analgesic: Secondary | ICD-10-CM | POA: Diagnosis not present

## 2015-02-05 DIAGNOSIS — R05 Cough: Secondary | ICD-10-CM | POA: Diagnosis not present

## 2015-02-05 DIAGNOSIS — I4891 Unspecified atrial fibrillation: Secondary | ICD-10-CM | POA: Diagnosis not present

## 2015-02-05 DIAGNOSIS — K219 Gastro-esophageal reflux disease without esophagitis: Secondary | ICD-10-CM | POA: Diagnosis not present

## 2015-02-05 DIAGNOSIS — S72111A Displaced fracture of greater trochanter of right femur, initial encounter for closed fracture: Secondary | ICD-10-CM | POA: Diagnosis present

## 2015-02-05 DIAGNOSIS — G35 Multiple sclerosis: Secondary | ICD-10-CM | POA: Diagnosis present

## 2015-02-05 DIAGNOSIS — R2681 Unsteadiness on feet: Secondary | ICD-10-CM | POA: Diagnosis not present

## 2015-02-05 DIAGNOSIS — Z888 Allergy status to other drugs, medicaments and biological substances status: Secondary | ICD-10-CM | POA: Diagnosis not present

## 2015-02-05 DIAGNOSIS — Z471 Aftercare following joint replacement surgery: Secondary | ICD-10-CM | POA: Diagnosis not present

## 2015-02-05 DIAGNOSIS — E039 Hypothyroidism, unspecified: Secondary | ICD-10-CM | POA: Diagnosis present

## 2015-02-05 DIAGNOSIS — Z79899 Other long term (current) drug therapy: Secondary | ICD-10-CM | POA: Diagnosis not present

## 2015-02-05 DIAGNOSIS — Z6835 Body mass index (BMI) 35.0-35.9, adult: Secondary | ICD-10-CM | POA: Diagnosis not present

## 2015-02-05 DIAGNOSIS — S72001A Fracture of unspecified part of neck of right femur, initial encounter for closed fracture: Secondary | ICD-10-CM | POA: Diagnosis not present

## 2015-02-05 DIAGNOSIS — I517 Cardiomegaly: Secondary | ICD-10-CM | POA: Diagnosis not present

## 2015-02-05 DIAGNOSIS — N3000 Acute cystitis without hematuria: Secondary | ICD-10-CM | POA: Diagnosis not present

## 2015-02-05 DIAGNOSIS — M21251 Flexion deformity, right hip: Secondary | ICD-10-CM | POA: Diagnosis not present

## 2015-02-05 DIAGNOSIS — I350 Nonrheumatic aortic (valve) stenosis: Secondary | ICD-10-CM | POA: Diagnosis present

## 2015-02-05 DIAGNOSIS — I482 Chronic atrial fibrillation: Secondary | ICD-10-CM | POA: Diagnosis not present

## 2015-02-05 DIAGNOSIS — T84040A Periprosthetic fracture around internal prosthetic right hip joint, initial encounter: Secondary | ICD-10-CM | POA: Diagnosis not present

## 2015-02-05 DIAGNOSIS — Z9181 History of falling: Secondary | ICD-10-CM | POA: Diagnosis not present

## 2015-02-05 DIAGNOSIS — X58XXXA Exposure to other specified factors, initial encounter: Secondary | ICD-10-CM | POA: Diagnosis present

## 2015-02-05 DIAGNOSIS — R1311 Dysphagia, oral phase: Secondary | ICD-10-CM | POA: Diagnosis not present

## 2015-02-05 DIAGNOSIS — T84041D Periprosthetic fracture around internal prosthetic left hip joint, subsequent encounter: Secondary | ICD-10-CM | POA: Diagnosis not present

## 2015-02-05 DIAGNOSIS — E119 Type 2 diabetes mellitus without complications: Secondary | ICD-10-CM | POA: Diagnosis present

## 2015-02-05 DIAGNOSIS — E785 Hyperlipidemia, unspecified: Secondary | ICD-10-CM | POA: Diagnosis present

## 2015-02-05 DIAGNOSIS — R509 Fever, unspecified: Secondary | ICD-10-CM | POA: Diagnosis not present

## 2015-02-05 DIAGNOSIS — Z4789 Encounter for other orthopedic aftercare: Secondary | ICD-10-CM | POA: Diagnosis not present

## 2015-02-05 DIAGNOSIS — Z96641 Presence of right artificial hip joint: Secondary | ICD-10-CM | POA: Diagnosis not present

## 2015-02-05 DIAGNOSIS — M25551 Pain in right hip: Secondary | ICD-10-CM | POA: Diagnosis not present

## 2015-02-05 DIAGNOSIS — M9701XA Periprosthetic fracture around internal prosthetic right hip joint, initial encounter: Secondary | ICD-10-CM

## 2015-02-05 DIAGNOSIS — A047 Enterocolitis due to Clostridium difficile: Secondary | ICD-10-CM | POA: Diagnosis not present

## 2015-02-05 DIAGNOSIS — I10 Essential (primary) hypertension: Secondary | ICD-10-CM | POA: Diagnosis not present

## 2015-02-05 DIAGNOSIS — Z7901 Long term (current) use of anticoagulants: Secondary | ICD-10-CM | POA: Diagnosis not present

## 2015-02-05 DIAGNOSIS — M1990 Unspecified osteoarthritis, unspecified site: Secondary | ICD-10-CM | POA: Diagnosis present

## 2015-02-05 DIAGNOSIS — I739 Peripheral vascular disease, unspecified: Secondary | ICD-10-CM | POA: Diagnosis present

## 2015-02-05 DIAGNOSIS — N390 Urinary tract infection, site not specified: Secondary | ICD-10-CM | POA: Diagnosis not present

## 2015-02-05 DIAGNOSIS — B964 Proteus (mirabilis) (morganii) as the cause of diseases classified elsewhere: Secondary | ICD-10-CM | POA: Diagnosis present

## 2015-02-05 DIAGNOSIS — D62 Acute posthemorrhagic anemia: Secondary | ICD-10-CM | POA: Diagnosis not present

## 2015-02-05 LAB — BASIC METABOLIC PANEL
Anion gap: 13 (ref 5–15)
BUN: 18 mg/dL (ref 6–20)
CALCIUM: 8.4 mg/dL — AB (ref 8.9–10.3)
CO2: 26 mmol/L (ref 22–32)
Chloride: 100 mmol/L — ABNORMAL LOW (ref 101–111)
Creatinine, Ser: 0.73 mg/dL (ref 0.44–1.00)
GFR calc Af Amer: >60 mL/min (ref >60)
GLUCOSE: 140 mg/dL — AB (ref 65–99)
Potassium: 4.1 mmol/L (ref 3.5–5.1)
Sodium: 139 mmol/L (ref 135–145)

## 2015-02-05 LAB — URINALYSIS, ROUTINE W REFLEX MICROSCOPIC
Bilirubin Urine: NEGATIVE
GLUCOSE, UA: NEGATIVE mg/dL
Hgb urine dipstick: NEGATIVE
Ketones, ur: NEGATIVE mg/dL
Nitrite: NEGATIVE
PH: 7 (ref 5.0–8.0)
Protein, ur: NEGATIVE mg/dL
Specific Gravity, Urine: 1.018 (ref 1.005–1.030)
Urobilinogen, UA: 1 mg/dL (ref 0.0–1.0)

## 2015-02-05 LAB — CBC WITH DIFFERENTIAL/PLATELET
BASOS PCT: 0 % (ref 0–1)
Basophils Absolute: 0 10*3/uL (ref 0.0–0.1)
Eosinophils Absolute: 0.1 10*3/uL (ref 0.0–0.7)
Eosinophils Relative: 1 % (ref 0–5)
HEMATOCRIT: 34.3 % — AB (ref 36.0–46.0)
HEMOGLOBIN: 11.4 g/dL — AB (ref 12.0–15.0)
LYMPHS PCT: 12 % (ref 12–46)
Lymphs Abs: 1.5 10*3/uL (ref 0.7–4.0)
MCH: 30 pg (ref 26.0–34.0)
MCHC: 33.2 g/dL (ref 30.0–36.0)
MCV: 90.3 fL (ref 78.0–100.0)
MONO ABS: 1.1 10*3/uL — AB (ref 0.1–1.0)
MONOS PCT: 9 % (ref 3–12)
Neutro Abs: 9.8 10*3/uL — ABNORMAL HIGH (ref 1.7–7.7)
Neutrophils Relative %: 78 % — ABNORMAL HIGH (ref 43–77)
Platelets: 321 10*3/uL (ref 150–400)
RBC: 3.8 MIL/uL — ABNORMAL LOW (ref 3.87–5.11)
RDW: 15.1 % (ref 11.5–15.5)
WBC: 12.5 10*3/uL — AB (ref 4.0–10.5)

## 2015-02-05 LAB — URINE MICROSCOPIC-ADD ON

## 2015-02-05 LAB — CBC
HCT: 33 % — ABNORMAL LOW (ref 36.0–46.0)
Hemoglobin: 10.6 g/dL — ABNORMAL LOW (ref 12.0–15.0)
MCH: 29.4 pg (ref 26.0–34.0)
MCHC: 32.1 g/dL (ref 30.0–36.0)
MCV: 91.4 fL (ref 78.0–100.0)
PLATELETS: 301 10*3/uL (ref 150–400)
RBC: 3.61 MIL/uL — AB (ref 3.87–5.11)
RDW: 15.2 % (ref 11.5–15.5)
WBC: 11.5 10*3/uL — AB (ref 4.0–10.5)

## 2015-02-05 LAB — COMPREHENSIVE METABOLIC PANEL
ALT: 17 U/L (ref 14–54)
ANION GAP: 10 (ref 5–15)
AST: 20 U/L (ref 15–41)
Albumin: 3.1 g/dL — ABNORMAL LOW (ref 3.5–5.0)
Alkaline Phosphatase: 94 U/L (ref 38–126)
BUN: 17 mg/dL (ref 6–20)
CO2: 28 mmol/L (ref 22–32)
CREATININE: 0.77 mg/dL (ref 0.44–1.00)
Calcium: 8.4 mg/dL — ABNORMAL LOW (ref 8.9–10.3)
Chloride: 99 mmol/L — ABNORMAL LOW (ref 101–111)
GFR calc Af Amer: >60 mL/min (ref >60)
GLUCOSE: 138 mg/dL — AB (ref 65–99)
Potassium: 4.6 mmol/L (ref 3.5–5.1)
Sodium: 137 mmol/L (ref 135–145)
Total Bilirubin: 0.7 mg/dL (ref 0.3–1.2)
Total Protein: 6.3 g/dL — ABNORMAL LOW (ref 6.5–8.1)

## 2015-02-05 LAB — PROTIME-INR
INR: 2 — ABNORMAL HIGH (ref 0.00–1.49)
Prothrombin Time: 22.6 seconds — ABNORMAL HIGH (ref 11.6–15.2)

## 2015-02-05 LAB — GLUCOSE, CAPILLARY
GLUCOSE-CAPILLARY: 114 mg/dL — AB (ref 65–99)
Glucose-Capillary: 132 mg/dL — ABNORMAL HIGH (ref 65–99)
Glucose-Capillary: 125 mg/dL — ABNORMAL HIGH (ref 65–99)

## 2015-02-05 MED ORDER — MORPHINE SULFATE 2 MG/ML IJ SOLN
0.5000 mg | INTRAMUSCULAR | Status: DC | PRN
  Administered 2015-02-05 – 2015-02-07 (×4): 0.5 mg via INTRAVENOUS
  Filled 2015-02-05 (×4): qty 1

## 2015-02-05 MED ORDER — CEFTRIAXONE SODIUM IN DEXTROSE 20 MG/ML IV SOLN
1.0000 g | INTRAVENOUS | Status: DC
  Administered 2015-02-05 – 2015-02-11 (×7): 1 g via INTRAVENOUS
  Filled 2015-02-05 (×7): qty 50

## 2015-02-05 MED ORDER — FENTANYL CITRATE (PF) 100 MCG/2ML IJ SOLN
50.0000 ug | Freq: Once | INTRAMUSCULAR | Status: AC
  Administered 2015-02-05: 50 ug via INTRAVENOUS
  Filled 2015-02-05: qty 2

## 2015-02-05 MED ORDER — METHOCARBAMOL 1000 MG/10ML IJ SOLN
500.0000 mg | Freq: Four times a day (QID) | INTRAMUSCULAR | Status: DC | PRN
  Administered 2015-02-05 – 2015-02-10 (×4): 500 mg via INTRAVENOUS
  Filled 2015-02-05 (×6): qty 5

## 2015-02-05 MED ORDER — LEVOTHYROXINE SODIUM 100 MCG IV SOLR
62.5000 ug | Freq: Every day | INTRAVENOUS | Status: DC
  Administered 2015-02-05 – 2015-02-08 (×4): 62.5 ug via INTRAVENOUS
  Filled 2015-02-05 (×4): qty 5

## 2015-02-05 MED ORDER — CHLORHEXIDINE GLUCONATE 0.12 % MT SOLN
15.0000 mL | Freq: Two times a day (BID) | OROMUCOSAL | Status: DC
  Administered 2015-02-06 – 2015-02-11 (×9): 15 mL via OROMUCOSAL
  Filled 2015-02-05 (×15): qty 15

## 2015-02-05 MED ORDER — SODIUM CHLORIDE 0.9 % IV SOLN
INTRAVENOUS | Status: DC
  Administered 2015-02-05: 04:00:00 via INTRAVENOUS

## 2015-02-05 MED ORDER — CETYLPYRIDINIUM CHLORIDE 0.05 % MT LIQD
7.0000 mL | Freq: Two times a day (BID) | OROMUCOSAL | Status: DC
  Administered 2015-02-06 – 2015-02-11 (×7): 7 mL via OROMUCOSAL

## 2015-02-05 MED ORDER — ENSURE ENLIVE PO LIQD
237.0000 mL | Freq: Two times a day (BID) | ORAL | Status: DC
  Administered 2015-02-06 – 2015-02-12 (×7): 237 mL via ORAL

## 2015-02-05 MED ORDER — HYDROCODONE-ACETAMINOPHEN 5-325 MG PO TABS
1.0000 | ORAL_TABLET | Freq: Four times a day (QID) | ORAL | Status: DC | PRN
  Administered 2015-02-05: 2 via ORAL
  Administered 2015-02-05 (×2): 1 via ORAL
  Administered 2015-02-06: 2 via ORAL
  Administered 2015-02-06: 1 via ORAL
  Administered 2015-02-06: 2 via ORAL
  Administered 2015-02-07 (×2): 1 via ORAL
  Administered 2015-02-07: 2 via ORAL
  Filled 2015-02-05 (×3): qty 2
  Filled 2015-02-05: qty 1
  Filled 2015-02-05: qty 2
  Filled 2015-02-05 (×4): qty 1
  Filled 2015-02-05: qty 2

## 2015-02-05 MED ORDER — METOPROLOL TARTRATE 1 MG/ML IV SOLN
5.0000 mg | Freq: Four times a day (QID) | INTRAVENOUS | Status: DC
  Administered 2015-02-05 – 2015-02-08 (×9): 5 mg via INTRAVENOUS
  Filled 2015-02-05 (×17): qty 5

## 2015-02-05 MED ORDER — HYDRALAZINE HCL 20 MG/ML IJ SOLN: 10.0000 mg | INTRAMUSCULAR | Status: DC | PRN

## 2015-02-05 NOTE — Care Management Note (Signed)
Case Management Note  Patient Details  Name: Lynnex Smelcer MRN: [id number] Date of Birth: [birth year]  Subjective/Objective:                    Periprosthetic fracture around internal prosthetic right hip joint Action/Plan:  Discharge planning Expected Discharge Date:                  Expected Discharge Plan:  Skilled Nursing Facility  In-House Referral:     Discharge planning Services  CM Consult  Post Acute Care Choice:    Choice offered to:     DME Arranged:    DME Agency:     HH Arranged:    HH Agency:     Status of Service:  In process, will continue to follow  Medicare Important Message Given:    Date Medicare IM Given:    Medicare IM give by:    Date Additional Medicare IM Given:    Additional Medicare Important Message give by:     If discussed at Long Length of Stay Meetings, dates discussed:    Additional Comments: CM notes pt from SNF and will most likely go back to SNF post surgery; CSW aware.  No other CM needs were communicated. Yves Dill, RN 02/05/2015, 11:59 AM

## 2015-02-05 NOTE — Clinical Social Work Note (Signed)
Clinical Social Work Assessment  Patient Details  Name: Makayla Thomas MRN: [id number] Date of Birth: [birth year]  Date of referral:  02/05/15               Reason for consult:  Facility Placement, Discharge Planning                Permission sought to share information with:    Permission granted to share information::     Name::        Agency::     Relationship::     Contact Information:     Housing/Transportation Living arrangements for the past 2 months:  [place] of Information:  Patient, Adult Children Patient Interpreter Needed:  None Criminal Activity/Legal Involvement Pertinent to Current Situation/Hospitalization:  No - Comment as needed Significant Relationships:  Adult Children Lives with:  Facility Resident Do you feel safe going back to the place where you live?  No Need for family participation in patient care:  Yes (Comment)  Care giving concerns:  Pt / family does not want pt to return to prior SNF placement at d/c.   Social Worker assessment / plan:  Pt hospitalized on 01/29/15 for pre planned right total hip arthroplasty. Pt was d/c to [facility] on 02/01/15 for ST Rehab. Pt was re hospitalized 02/05/15 with a right hip fx. She will have surgery today. CSW met with pt / son to assist with d/c planning. A new rehab placement has been requested. SNF search initiated and bed offers are pending.  Employment status:  Retired Forensic scientist:  Medicare PT Recommendations:  Not assessed at this time [place] / Referral to community resources:  [place]  Patient/Family's Response to care:  Pt / family feel ST rehab is needed at d/c.  Patient/Family's Understanding of and Emotional Response to Diagnosis, Current Treatment, and Prognosis: Pt / family are disappointed with previous SNF placement. They are hopeful for a better experience at next SNF placement.  Emotional Assessment Appearance:  Appears stated  age Attitude/Demeanor/Rapport:  Other (cooperative) Affect (typically observed):  Appropriate, Pleasant Orientation:  Oriented to Self, Oriented to Place, Oriented to  Time, Oriented to Situation Alcohol / Substance use:  Not Applicable Psych involvement (Current and /or in the community):  No (Comment)  Discharge Needs  Concerns to be addressed:  Discharge Planning Concerns Readmission within the last 30 days:  Yes Current discharge risk:  None Barriers to Discharge:  No Barriers Identified   Loraine Maple [phone] 02/05/2015, 2:18 PM 17/16 following pre planned

## 2015-02-05 NOTE — Consult Note (Signed)
Reason for Consult:right periprosthetic hip fracture Referring Physician: [place]  Makayla Thomas is an 81 y.o. female.  HPI: 81 yo female s/p THA one week ago who presents with a three day history of increasing right groin and thigh pain and inability to ambulate. Patient transported from skilled nursing facility for further evaluation.  Past Medical History  Diagnosis Date  . Permanent atrial fibrillation   . Chronic anticoagulation     on Xarelto  . Hypothyroidism   . Dyslipidemia     treated  . PAD (peripheral artery disease) 08/30/2009    Dopplers; with bil. post. tib artery occlusion  . Lower extremity edema     chronic  . Aortic stenosis, mild 08/2009    ECHO  EF 55-60%, wth increased EDP, mild   . H/O cardiovascular stress test 05/2010    Lexiscan cardiolite no ischemia or infarction  . History of ETT 02/2011    Naughton exercise protocol, negative with poor exercise tolerance  . H/O multiple sclerosis     no excaerbations in some time  . Complication of anesthesia     hallusinations  . Dysrhythmia     A-FIB  . Visual disturbances     "FLASHING IN MY EYES"  . Arthritis   . Vaginal itching   . Constipation   . Anemia   . History of transfusion   . Borderline diabetes   . Transient ischemic attack (TIA)     NO RESIDUAL PROBLEMS  . Diabetes mellitus without complication     possibly    Past Surgical History  Procedure Laterality Date  . Cesarean section      hx of 3  . Throidectomy partial      lt  . Cholecystectomy    . Breast surgery      L tumor removed - benign  . Joint replacement  2012    left total knee  . Total hip arthroplasty Right 01/29/2015    Procedure: RIGHT TOTAL HIP ARTHROPLASTY ANTERIOR APPROACH;  Surgeon: Paralee Cancel, MD;  Location: [facility];  Service: Orthopedics;  Laterality: Right;    Family History  Problem Relation Age of Onset  . Heart failure Father 71    Social History:  reports that she has never smoked. She has never used  smokeless tobacco. She reports that she drinks alcohol. She reports that she does not use illicit drugs.  Allergies:  Allergies  Allergen Reactions  . Tetanus Antitoxin Anaphylaxis    Throat swelling  . Meclizine Other (See Comments)    Had funny feeling    Medications: I have reviewed the patient's current medications.  Results for orders placed or performed during the hospital encounter of 02/04/15 (from the past 48 hour(s))  CBC with Differential/Platelet     Status: Abnormal   Collection Time: 02/05/15 12:48 AM  Result Value Ref Range   WBC 12.5 (H) 4.0 - 10.5 K/uL   RBC 3.80 (L) 3.87 - 5.11 MIL/uL   Hemoglobin 11.4 (L) 12.0 - 15.0 g/dL   HCT 34.3 (L) 36.0 - 46.0 %   MCV 90.3 78.0 - 100.0 fL   MCH 30.0 26.0 - 34.0 pg   MCHC 33.2 30.0 - 36.0 g/dL   RDW 15.1 11.5 - 15.5 %   Platelets 321 150 - 400 K/uL   Neutrophils Relative % 78 (H) 43 - 77 %   Neutro Abs 9.8 (H) 1.7 - 7.7 K/uL   Lymphocytes Relative 12 12 - 46 %   Lymphs Abs 1.5  0.7 - 4.0 K/uL   Monocytes Relative 9 3 - 12 %   Monocytes Absolute 1.1 (H) 0.1 - 1.0 K/uL   Eosinophils Relative 1 0 - 5 %   Eosinophils Absolute 0.1 0.0 - 0.7 K/uL   Basophils Relative 0 0 - 1 %   Basophils Absolute 0.0 0.0 - 0.1 K/uL  Basic metabolic panel     Status: Abnormal   Collection Time: 02/05/15 12:48 AM  Result Value Ref Range   Sodium 139 135 - 145 mmol/L   Potassium 4.1 3.5 - 5.1 mmol/L   Chloride 100 (L) 101 - 111 mmol/L   CO2 26 22 - 32 mmol/L   Glucose, Bld 140 (H) 65 - 99 mg/dL   BUN 18 6 - 20 mg/dL   Creatinine, Ser 0.73 0.44 - 1.00 mg/dL   Calcium 8.4 (L) 8.9 - 10.3 mg/dL   GFR calc non Af Amer >60 >60 mL/min   GFR calc Af Amer >60 >60 mL/min    Comment: (NOTE) The eGFR has been calculated using the CKD EPI equation. This calculation has not been validated in all clinical situations. eGFR's persistently <60 mL/min signify possible Chronic Kidney Disease.    Anion gap 13 5 - 15    Dg Chest 1 View  02/05/2015    CLINICAL DATA:  Patient with prior right hip replacement. Recent diagnosis right femur fracture.  EXAM: CHEST  1 VIEW  COMPARISON:  Chest radiograph 08/29/2014  FINDINGS: Monitoring leads overlie the patient. Stable enlarged cardiac and mediastinal contours with tortuosity and calcification of the thoracic aorta. Bilateral coarse interstitial opacities. No large consolidative pulmonary opacity. No pleural effusion or pneumothorax.  IMPRESSION: Cardiomegaly.  No acute cardiopulmonary process.   Electronically Signed   By: Lovey Newcomer M.D.   On: 02/05/2015 01:57   Dg Pelvis 1-2 Views  02/05/2015   CLINICAL DATA:  Patient with recent proximal right femur fracture.  EXAM: PELVIS - 1-2 VIEW  COMPARISON:  None.  FINDINGS: Re- demonstrated comminuted proximal right femur fracture about right femoral hardware. Lower lumbar spine degenerative changes. SI joints are unremarkable. Pelvic phleboliths.  IMPRESSION: Re- demonstrated comminuted proximal right femur fracture about the hardware. Pelvic ring remains intact.   Electronically Signed   By: Lovey Newcomer M.D.   On: 02/05/2015 02:07   Dg Femur, Min 2 Views Right  02/05/2015   CLINICAL DATA:  Patient with recent right hip replacement. Prior radiograph showing proximal right femur fracture.  EXAM: RIGHT FEMUR 2 VIEWS  COMPARISON:  01/29/2015  FINDINGS: Patient status post right hip arthroplasty. Comminuted fracture of the proximal right femur with associated foreshortening. Degenerative changes of the knee. Vascular calcifications. Pelvic phleboliths.  IMPRESSION: Comminuted proximal right femur fracture with associated foreshortening about the femoral stem of the right hip replacement.   Electronically Signed   By: Lovey Newcomer M.D.   On: 02/05/2015 01:55    ROS Blood pressure 140/94, pulse 84, temperature 98.3 F (36.8 C), temperature source Oral, resp. rate 18, SpO2 94 %. Physical Exam healthy appearing female in severe distress, bilateral UEs with normal ROM  and strength. Left LE with normal AROM NVI, right LE externally rotated, unable to move due to pain.  Good 2+ posterior tib and DP pulses. Sensation intact   Assessment/Plan: Periprosthetic right proximal femur fracture.  I will discuss with Dr Alvan Dame in the morning who will provide the definitive care plan.  Keep NPO for now. Analgesia and mechanical DVT prophylaxis, hold anticoagulants.  Thank you!  Makayla Thomas,STEVEN R 02/05/2015, 3:28 AM

## 2015-02-05 NOTE — Progress Notes (Signed)
ANTIBIOTIC CONSULT NOTE - INITIAL  Pharmacy Consult for Rocephin Indication: UTI  Allergies  Allergen Reactions  . Tetanus Antitoxin Anaphylaxis    Throat swelling  . Meclizine Other (See Comments)    Had funny feeling    Patient Measurements:   Wt=81 kg  Vital Signs: Temp: 98.4 F (36.9 C) (06/21 0422) Temp Source: Oral (06/21 0422) BP: 149/79 mmHg (06/21 0422) Pulse Rate: 71 (06/21 0422) Intake/Output from previous day: 06/20 0701 - 06/21 0700 In: -  Out: 550 [Urine:550] Intake/Output from this shift: Total I/O In: -  Out: 550 [Urine:550]  Labs:  Recent Labs  02/05/15 0048  WBC 12.5*  HGB 11.4*  PLT 321  CREATININE 0.73   Estimated Creatinine Clearance: 47.5 mL/min (by C-G formula based on Cr of 0.73). No results for input(s): VANCOTROUGH, VANCOPEAK, VANCORANDOM, GENTTROUGH, GENTPEAK, GENTRANDOM, TOBRATROUGH, TOBRAPEAK, TOBRARND, AMIKACINPEAK, AMIKACINTROU, AMIKACIN in the last 72 hours.   Microbiology: Recent Results (from the past 720 hour(s))  Surgical pcr screen     Status: Abnormal   Collection Time: 01/23/15  8:25 AM  Result Value Ref Range Status   MRSA, PCR NEGATIVE NEGATIVE Final   Staphylococcus aureus POSITIVE (A) NEGATIVE Final    Comment:        The Xpert SA Assay (FDA approved for NASAL specimens in patients over 79 years of age), is one component of a comprehensive surveillance program.  Test performance has been validated by [facility] for patients greater than or equal to 79 year old. It is not intended to diagnose infection nor to guide or monitor treatment.     Medical History: Past Medical History  Diagnosis Date  . Permanent atrial fibrillation   . Chronic anticoagulation     on Xarelto  . Hypothyroidism   . Dyslipidemia     treated  . PAD (peripheral artery disease) 08/30/2009    Dopplers; with bil. post. tib artery occlusion  . Lower extremity edema     chronic  . Aortic stenosis, mild 08/2009    ECHO  EF 55-60%,  wth increased EDP, mild   . H/O cardiovascular stress test 05/2010    Lexiscan cardiolite no ischemia or infarction  . History of ETT 02/2011    Naughton exercise protocol, negative with poor exercise tolerance  . H/O multiple sclerosis     no excaerbations in some time  . Complication of anesthesia     hallusinations  . Dysrhythmia     A-FIB  . Visual disturbances     "FLASHING IN MY EYES"  . Arthritis   . Vaginal itching   . Constipation   . Anemia   . History of transfusion   . Borderline diabetes   . Transient ischemic attack (TIA)     NO RESIDUAL PROBLEMS  . Diabetes mellitus without complication     possibly    Medications:  Prescriptions prior to admission  Medication Sig Dispense Refill Last Dose  . acetaminophen (TYLENOL) 325 MG tablet Take 1-2 tablets (325-650 mg total) by mouth every 6 (six) hours as needed for moderate pain.   02/04/2015 at Unknown time  . bisoprolol (ZEBETA) 10 MG tablet Take 1 tablet (10 mg total) by mouth daily. 30 tablet 2 02/04/2015 at 0800  . cholecalciferol (VITAMIN D) 1000 UNITS tablet Take 1,000 Units by mouth every evening.    02/04/2015 at Unknown time  . diltiazem (CARDIZEM CD) 120 MG 24 hr capsule Take 120 mg by mouth daily.   02/04/2015 at Unknown time  .  docusate sodium (COLACE) 100 MG capsule Take 1 capsule (100 mg total) by mouth 2 (two) times daily. 10 capsule 0 02/04/2015 at Unknown time  . ferrous sulfate 325 (65 FE) MG tablet Take 1 tablet (325 mg total) by mouth 3 (three) times daily after meals.  3 02/04/2015 at Unknown time  . furosemide (LASIX) 20 MG tablet Take 20 mg by mouth every morning.   02/04/2015 at Unknown time  . Pitavastatin Calcium 4 MG TABS Take 1 mg by mouth every evening.     . polyethylene glycol (MIRALAX / GLYCOLAX) packet Take 17 g by mouth 2 (two) times daily. 14 each 0 02/04/2015 at Unknown time  . pravastatin (PRAVACHOL) 40 MG tablet Take 40 mg by mouth daily.   02/04/2015 at Unknown time  . SYNTHROID 125 MCG  tablet Take 125 mcg by mouth daily.   02/04/2015 at Unknown time  . tiZANidine (ZANAFLEX) 4 MG tablet Take 1 tablet (4 mg total) by mouth every 6 (six) hours as needed for muscle spasms. 40 tablet 0 02/04/2015 at Unknown time  . traMADol (ULTRAM) 50 MG tablet Take 1 tablet (50 mg total) by mouth every 6 (six) hours as needed. (Patient taking differently: Take 50 mg by mouth every 6 (six) hours as needed for moderate pain. ) 40 tablet 0 02/04/2015 at Unknown time  . triamterene-hydrochlorothiazide (MAXZIDE-25) 37.5-25 MG per tablet Take 1 tablet by mouth every morning.   02/04/2015 at Unknown time  . tuberculin (APLISOL) 5 UNIT/0.1ML injection Inject 5 Units into the skin once.   02/01/2015 at other  . XARELTO 15 MG TABS tablet Take 15 mg by mouth every evening.    02/04/2015 at 1600   Scheduled:  . cefTRIAXone (ROCEPHIN)  IV  1 g Intravenous Q24H  . levothyroxine  62.5 mcg Intravenous Daily  . metoprolol  5 mg Intravenous 4 times per day   Infusions:   Assessment: 81 yoF admitted with hip fracture.  Rocephin per Rx for UTI.   Goal of Therapy:  Treat UTI  Plan:   Rocephin 1Gm IV q24h  F/u Scr/cultures  Lorenza Evangelist 02/05/2015,5:45 AM

## 2015-02-05 NOTE — ED Provider Notes (Signed)
CSN: [id number]     Arrival date & time 02/04/15  2349 History   First MD Initiated Contact with Patient 02/05/15 0030     Chief Complaint  Patient presents with  . Leg Pain     (Consider location/radiation/quality/duration/timing/severity/associated sxs/prior Treatment) HPI  This is an 81 year old female who underwent a right total hip arthroplasty on the 14th of this month. She had been able to bear weight as of 3 days ago. Over the past 3 days she has had increasing pain in the right groin area radiating to the entire right lower extremity. The pain is exacerbated by attempted movement of her right leg. She was not able to cooperate with physical therapy yesterday. Her right leg was noted to be shortened and externally rotated and an x-ray showed a right femoral fracture. Her pain has been severe at its worst. It is minimal at the present time after being given fentanyl 100 micrograms IV prior to arrival.  Past Medical History  Diagnosis Date  . Permanent atrial fibrillation   . Chronic anticoagulation     on Xarelto  . Hypothyroidism   . Dyslipidemia     treated  . PAD (peripheral artery disease) 08/30/2009    Dopplers; with bil. post. tib artery occlusion  . Lower extremity edema     chronic  . Aortic stenosis, mild 08/2009    ECHO  EF 55-60%, wth increased EDP, mild   . H/O cardiovascular stress test 05/2010    Lexiscan cardiolite no ischemia or infarction  . History of ETT 02/2011    Naughton exercise protocol, negative with poor exercise tolerance  . H/O multiple sclerosis     no excaerbations in some time  . Complication of anesthesia     hallusinations  . Dysrhythmia     A-FIB  . Visual disturbances     "FLASHING IN MY EYES"  . Arthritis   . Vaginal itching   . Constipation   . Anemia   . History of transfusion   . Borderline diabetes   . Transient ischemic attack (TIA)     NO RESIDUAL PROBLEMS  . Diabetes mellitus without complication     possibly   Past  Surgical History  Procedure Laterality Date  . Cesarean section      hx of 3  . Throidectomy partial      lt  . Cholecystectomy    . Breast surgery      L tumor removed - benign  . Joint replacement  2012    left total knee  . Total hip arthroplasty Right 01/29/2015    Procedure: RIGHT TOTAL HIP ARTHROPLASTY ANTERIOR APPROACH;  Surgeon: Durene Romans, MD;  Location: [facility];  Service: Orthopedics;  Laterality: Right;   Family History  Problem Relation Age of Onset  . Heart failure Father 44   History  Substance Use Topics  . Smoking status: Never Smoker   . Smokeless tobacco: Never Used  . Alcohol Use: Yes     Comment: 1 glass every other week at the most   OB History    No data available     Review of Systems  All other systems reviewed and are negative.   Allergies  Tetanus antitoxin and Meclizine  Home Medications   Prior to Admission medications   Medication Sig Start Date End Date Taking? Authorizing Provider  acetaminophen (TYLENOL) 325 MG tablet Take 1-2 tablets (325-650 mg total) by mouth every 6 (six) hours as needed for moderate pain. 02/01/15  Yes Lanney Gins, PA-C  bisoprolol (ZEBETA) 10 MG tablet Take 1 tablet (10 mg total) by mouth daily. 09/03/14  Yes Richarda Overlie, MD  cholecalciferol (VITAMIN D) 1000 UNITS tablet Take 1,000 Units by mouth every evening.    Yes Historical Provider, MD  diltiazem (CARDIZEM CD) 120 MG 24 hr capsule Take 120 mg by mouth daily.   Yes Historical Provider, MD  docusate sodium (COLACE) 100 MG capsule Take 1 capsule (100 mg total) by mouth 2 (two) times daily. 02/01/15  Yes Lanney Gins, PA-C  ferrous sulfate 325 (65 FE) MG tablet Take 1 tablet (325 mg total) by mouth 3 (three) times daily after meals. 02/01/15  Yes Lanney Gins, PA-C  furosemide (LASIX) 20 MG tablet Take 20 mg by mouth every morning.   Yes Historical Provider, MD  Pitavastatin Calcium 4 MG TABS Take 1 mg by mouth every evening.   Yes Historical Provider, MD   polyethylene glycol (MIRALAX / GLYCOLAX) packet Take 17 g by mouth 2 (two) times daily. 02/01/15  Yes Matthew Babish, PA-C  SYNTHROID 125 MCG tablet Take 125 mcg by mouth daily. 04/13/12  Yes Historical Provider, MD  tiZANidine (ZANAFLEX) 4 MG tablet Take 1 tablet (4 mg total) by mouth every 6 (six) hours as needed for muscle spasms. 02/01/15  Yes Lanney Gins, PA-C  traMADol (ULTRAM) 50 MG tablet Take 1 tablet (50 mg total) by mouth every 6 (six) hours as needed. Patient taking differently: Take 50 mg by mouth every 6 (six) hours as needed for moderate pain.  02/01/15  Yes Lanney Gins, PA-C  triamterene-hydrochlorothiazide (MAXZIDE-25) 37.5-25 MG per tablet Take 1 tablet by mouth every morning.   Yes Historical Provider, MD  XARELTO 15 MG TABS tablet Take 15 mg by mouth every evening.  05/25/12  Yes Historical Provider, MD   BP 140/94 mmHg  Pulse 84  Temp(Src) 98.3 F (36.8 C) (Oral)  Resp 18  SpO2 94%   Physical Exam  General: Well-developed, well-nourished female in no acute distress; appearance consistent with age of record HENT: normocephalic; atraumatic Eyes: pupils equal, round and reactive to light; extraocular muscles intact; lens implants Neck: supple Heart: Irregular rhythm Lungs: clear to auscultation bilaterally Abdomen: soft; nondistended; nontender; bowel sounds present Extremities: Right lower extremity shortened and externally rotated; range of motion of right lower extremity not tested due to pain and deformity; pulses normal Neurologic: Awake, alert and oriented; motor function intact in all extremities and symmetric; no facial droop Skin: Warm and dry Psychiatric: Flat affect    ED Course  Procedures (including critical care time)   MDM  Nursing notes and vitals signs, including pulse oximetry, reviewed.  Summary of this visit's results, reviewed by myself:  Labs:  Results for orders placed or performed during the hospital encounter of 02/04/15 (from the  past 24 hour(s))  CBC with Differential/Platelet     Status: Abnormal   Collection Time: 02/05/15 12:48 AM  Result Value Ref Range   WBC 12.5 (H) 4.0 - 10.5 K/uL   RBC 3.80 (L) 3.87 - 5.11 MIL/uL   Hemoglobin 11.4 (L) 12.0 - 15.0 g/dL   HCT 40.9 (L) 81.1 - 91.4 %   MCV 90.3 78.0 - 100.0 fL   MCH 30.0 26.0 - 34.0 pg   MCHC 33.2 30.0 - 36.0 g/dL   RDW 78.2 95.6 - 21.3 %   Platelets 321 150 - 400 K/uL   Neutrophils Relative % 78 (H) 43 - 77 %   Neutro Abs 9.8 (H)  1.7 - 7.7 K/uL   Lymphocytes Relative 12 12 - 46 %   Lymphs Abs 1.5 0.7 - 4.0 K/uL   Monocytes Relative 9 3 - 12 %   Monocytes Absolute 1.1 (H) 0.1 - 1.0 K/uL   Eosinophils Relative 1 0 - 5 %   Eosinophils Absolute 0.1 0.0 - 0.7 K/uL   Basophils Relative 0 0 - 1 %   Basophils Absolute 0.0 0.0 - 0.1 K/uL  Basic metabolic panel     Status: Abnormal   Collection Time: 02/05/15 12:48 AM  Result Value Ref Range   Sodium 139 135 - 145 mmol/L   Potassium 4.1 3.5 - 5.1 mmol/L   Chloride 100 (L) 101 - 111 mmol/L   CO2 26 22 - 32 mmol/L   Glucose, Bld 140 (H) 65 - 99 mg/dL   BUN 18 6 - 20 mg/dL   Creatinine, Ser 1.19 0.44 - 1.00 mg/dL   Calcium 8.4 (L) 8.9 - 10.3 mg/dL   GFR calc non Af Amer >60 >60 mL/min   GFR calc Af Amer >60 >60 mL/min   Anion gap 13 5 - 15    Imaging Studies: Dg Chest 1 View  02/05/2015   CLINICAL DATA:  Patient with prior right hip replacement. Recent diagnosis right femur fracture.  EXAM: CHEST  1 VIEW  COMPARISON:  Chest radiograph 08/29/2014  FINDINGS: Monitoring leads overlie the patient. Stable enlarged cardiac and mediastinal contours with tortuosity and calcification of the thoracic aorta. Bilateral coarse interstitial opacities. No large consolidative pulmonary opacity. No pleural effusion or pneumothorax.  IMPRESSION: Cardiomegaly.  No acute cardiopulmonary process.   Electronically Signed   By: Annia Belt M.D.   On: 02/05/2015 01:57   Dg Pelvis 1-2 Views  02/05/2015   CLINICAL DATA:  Patient  with recent proximal right femur fracture.  EXAM: PELVIS - 1-2 VIEW  COMPARISON:  None.  FINDINGS: Re- demonstrated comminuted proximal right femur fracture about right femoral hardware. Lower lumbar spine degenerative changes. SI joints are unremarkable. Pelvic phleboliths.  IMPRESSION: Re- demonstrated comminuted proximal right femur fracture about the hardware. Pelvic ring remains intact.   Electronically Signed   By: Annia Belt M.D.   On: 02/05/2015 02:07   Dg Femur, Min 2 Views Right  02/05/2015   CLINICAL DATA:  Patient with recent right hip replacement. Prior radiograph showing proximal right femur fracture.  EXAM: RIGHT FEMUR 2 VIEWS  COMPARISON:  01/29/2015  FINDINGS: Patient status post right hip arthroplasty. Comminuted fracture of the proximal right femur with associated foreshortening. Degenerative changes of the knee. Vascular calcifications. Pelvic phleboliths.  IMPRESSION: Comminuted proximal right femur fracture with associated foreshortening about the femoral stem of the right hip replacement.   Electronically Signed   By: Annia Belt M.D.   On: 02/05/2015 01:55       Paula Libra, MD 02/05/15 0221

## 2015-02-05 NOTE — Progress Notes (Signed)
Initial Nutrition Assessment  DOCUMENTATION CODES:  Obesity unspecified  INTERVENTION: - Will order Ensure Enlive po BID, each supplement provides 350 kcal and 20 grams of protein - RD will continue to monitor for needs  NUTRITION DIAGNOSIS:  Increased nutrient needs related to wound healing as evidenced by estimated needs.  GOAL:  Patient will meet greater than or equal to 90% of their needs  MONITOR:  PO intake, Supplement acceptance, Weight trends, Labs  REASON FOR ASSESSMENT:  Consult Hip fracture protocol  ASSESSMENT: 81 y.o. female with history of chronic atrial fibrillation on anticoagulation, history of stroke, hypertension, hypothyroidism was brought to the ER from the rehabilitation the patient was complaining of persistent right hip pain since yesterday morning. X-rays revealed right periprosthetic hip fracture. Patient had right ORIF for hip fracture last week.   Pt seen for consult. BMI indicates obesity. Pt reports recent weight gain; confirmed by chart review. Pt states she has a great appetite now and prior to this admission to include admission recently for R hip surgery s/p fx. Pt drinks Ensure at home and is interested in receiving it here.  Physical assessment does not show muscle or fat wasting. Medications reviewed. Labs reviewed; CBGs: 101-132 mg/dL, Cl: 99 mmol/L, Ca: 8.4 mg/dL.  Height:  Ht Readings from Last 1 Encounters:  02/05/15 5' 0.5" (1.537 m)    Weight:  Wt Readings from Last 1 Encounters:  02/05/15 184 lb 11.9 oz (83.8 kg)    Ideal Body Weight:  45.45 kg (kg)  Wt Readings from Last 10 Encounters:  02/05/15 184 lb 11.9 oz (83.8 kg)  01/29/15 180 lb (81.647 kg)  01/23/15 180 lb (81.647 kg)  12/07/14 180 lb 9.6 oz (81.92 kg)  08/29/14 174 lb 13.2 oz (79.3 kg)  04/24/14 183 lb 6.4 oz (83.19 kg)  01/03/14 188 lb 7.9 oz (85.5 kg)  07/18/13 184 lb (83.462 kg)  01/10/13 188 lb (85.276 kg)  07/11/12 184 lb (83.462 kg)    BMI:  Body  mass index is 35.47 kg/(m^2).  Estimated Nutritional Needs:  Kcal:  1300-1500  Protein:  70-80 grams  Fluid:  2-2.2 L/day  Skin:  Wound (see comment) (R hip surgical wound)  Diet Order:  Diet regular Room service appropriate?: Yes; Fluid consistency:: Thin  EDUCATION NEEDS:  No education needs identified at this time   Intake/Output Summary (Last 24 hours) at 02/05/15 1605 Last data filed at 02/05/15 0503  Gross per 24 hour  Intake      0 ml  Output    550 ml  Net   -550 ml    Last BM:  PTA     Trenton Gammon, RD, LDN Inpatient Clinical Dietitian Pager # [phone] After hours/weekend pager # [phone]

## 2015-02-05 NOTE — ED Notes (Signed)
Returned from xray

## 2015-02-05 NOTE — Clinical Social Work Placement (Signed)
   CLINICAL SOCIAL WORK PLACEMENT  NOTE  Date:  02/05/2015  Patient Details  Name: Makayla Thomas MRN: [id number] Date of Birth: 10/24/1925  Clinical Social Work is seeking post-discharge placement for this patient at the Skilled  Nursing Facility level of care (*CSW will initial, date and re-position this form in  chart as items are completed):  Yes   Patient/family provided with [facility] Clinical Social Work Department's list of facilities offering this level of care within the geographic area requested by the patient (or if unable, by the patient's family).  Yes   Patient/family informed of their freedom to choose among providers that offer the needed level of care, that participate in Medicare, Medicaid or managed care program needed by the patient, have an available bed and are willing to accept the patient.  Yes   Patient/family informed of [facility]'s ownership interest in [facility] and [facility], as well as of the fact that they are under no obligation to receive care at these facilities.  PASRR submitted to EDS on       PASRR number received on       Existing PASRR number confirmed on 02/05/15     FL2 transmitted to all facilities in geographic area requested by pt/family on 02/05/15     FL2 transmitted to all facilities within larger geographic area on       Patient informed that his/her managed care company has contracts with or will negotiate with certain facilities, including the following:            Patient/family informed of bed offers received.  Patient chooses bed at       Physician recommends and patient chooses bed at      Patient to be transferred to   on  .  Patient to be transferred to facility by       Patient family notified on   of transfer.  Name of family member notified:        PHYSICIAN       Additional Comment:    _______________________________________________ Drena Ham Lee, LCSW 02/05/2015, 2:31 PM  

## 2015-02-05 NOTE — H&P (Signed)
[facility] History and Physical  Makayla Thomas ZOX:[id number] DOB: June 02, 1926 DOA: 02/04/2015  Referring physician: Dr.Molpus. PCP: Londell Moh, MD  Specialists: Dr.Harding. Cardiologist.  Chief Complaint: Right hip pain.  HPI: Makayla Thomas is a 81 y.o. female with history of chronic atrial fibrillation on anticoagulation, history of stroke, hypertension, hypothyroidism was brought to the ER from the rehabilitation the patient was complaining of persistent right hip pain since yesterday morning. X-rays revealed right periprosthetic hip fracture. Patient had right ORIF for hip fracture last week. Patient denies any chest pain or shortness of breath headache visual symptoms nausea vomiting abdominal pain or diarrhea. Patient is on Xarelto and last dose was last seen in 4 PM which was confirmed with pharmacy. Dr. Ranell Patrick on-call orthopedic surgeon has already seen the patient and patient will be admitted for further management.   Review of Systems: As presented in the history of presenting illness, rest negative.  Past Medical History  Diagnosis Date  . Permanent atrial fibrillation   . Chronic anticoagulation     on Xarelto  . Hypothyroidism   . Dyslipidemia     treated  . PAD (peripheral artery disease) 08/30/2009    Dopplers; with bil. post. tib artery occlusion  . Lower extremity edema     chronic  . Aortic stenosis, mild 08/2009    ECHO  EF 55-60%, wth increased EDP, mild   . H/O cardiovascular stress test 05/2010    Lexiscan cardiolite no ischemia or infarction  . History of ETT 02/2011    Naughton exercise protocol, negative with poor exercise tolerance  . H/O multiple sclerosis     no excaerbations in some time  . Complication of anesthesia     hallusinations  . Dysrhythmia     A-FIB  . Visual disturbances     "FLASHING IN MY EYES"  . Arthritis   . Vaginal itching   . Constipation   . Anemia   . History of transfusion   . Borderline diabetes   .  Transient ischemic attack (TIA)     NO RESIDUAL PROBLEMS  . Diabetes mellitus without complication     possibly   Past Surgical History  Procedure Laterality Date  . Cesarean section      hx of 3  . Throidectomy partial      lt  . Cholecystectomy    . Breast surgery      L tumor removed - benign  . Joint replacement  2012    left total knee  . Total hip arthroplasty Right 01/29/2015    Procedure: RIGHT TOTAL HIP ARTHROPLASTY ANTERIOR APPROACH;  Surgeon: Durene Romans, MD;  Location: [facility];  Service: Orthopedics;  Laterality: Right;   Social History:  reports that she has never smoked. She has never used smokeless tobacco. She reports that she drinks alcohol. She reports that she does not use illicit drugs. Where does patient live presently in rehabilitation. Can patient participate in ADLs? Yes.  Allergies  Allergen Reactions  . Tetanus Antitoxin Anaphylaxis    Throat swelling  . Meclizine Other (See Comments)    Had funny feeling    Family History:  Family History  Problem Relation Age of Onset  . Heart failure Father 41      Prior to Admission medications   Medication Sig Start Date End Date Taking? Authorizing Provider  acetaminophen (TYLENOL) 325 MG tablet Take 1-2 tablets (325-650 mg total) by mouth every 6 (six) hours as needed for moderate pain. 02/01/15  Yes Molli Hazard  Babish, PA-C  bisoprolol (ZEBETA) 10 MG tablet Take 1 tablet (10 mg total) by mouth daily. 09/03/14  Yes Richarda Overlie, MD  cholecalciferol (VITAMIN D) 1000 UNITS tablet Take 1,000 Units by mouth every evening.    Yes Historical Provider, MD  diltiazem (CARDIZEM CD) 120 MG 24 hr capsule Take 120 mg by mouth daily.   Yes Historical Provider, MD  docusate sodium (COLACE) 100 MG capsule Take 1 capsule (100 mg total) by mouth 2 (two) times daily. 02/01/15  Yes Lanney Gins, PA-C  ferrous sulfate 325 (65 FE) MG tablet Take 1 tablet (325 mg total) by mouth 3 (three) times daily after meals. 02/01/15  Yes Lanney Gins, PA-C  furosemide (LASIX) 20 MG tablet Take 20 mg by mouth every morning.   Yes Historical Provider, MD  Pitavastatin Calcium 4 MG TABS Take 1 mg by mouth every evening.   Yes Historical Provider, MD  polyethylene glycol (MIRALAX / GLYCOLAX) packet Take 17 g by mouth 2 (two) times daily. 02/01/15  Yes Lanney Gins, PA-C  pravastatin (PRAVACHOL) 40 MG tablet Take 40 mg by mouth daily.   Yes Historical Provider, MD  SYNTHROID 125 MCG tablet Take 125 mcg by mouth daily. 04/13/12  Yes Historical Provider, MD  tiZANidine (ZANAFLEX) 4 MG tablet Take 1 tablet (4 mg total) by mouth every 6 (six) hours as needed for muscle spasms. 02/01/15  Yes Lanney Gins, PA-C  traMADol (ULTRAM) 50 MG tablet Take 1 tablet (50 mg total) by mouth every 6 (six) hours as needed. Patient taking differently: Take 50 mg by mouth every 6 (six) hours as needed for moderate pain.  02/01/15  Yes Lanney Gins, PA-C  triamterene-hydrochlorothiazide (MAXZIDE-25) 37.5-25 MG per tablet Take 1 tablet by mouth every morning.   Yes Historical Provider, MD  tuberculin (APLISOL) 5 UNIT/0.1ML injection Inject 5 Units into the skin once.   Yes Historical Provider, MD  XARELTO 15 MG TABS tablet Take 15 mg by mouth every evening.  05/25/12  Yes Historical Provider, MD    Physical Exam: Filed Vitals:   02/04/15 2349 02/04/15 2355 02/05/15 0422  BP:  140/94 149/79  Pulse:  84 71  Temp:  98.3 F (36.8 C) 98.4 F (36.9 C)  TempSrc:  Oral Oral  Resp:  18 16  SpO2: 94% 94% 95%     General:  Well-developed and nourished.  Eyes: Anicteric no pallor.  ENT: No discharge from ears eyes nose and mouth.  Neck: No mass felt.  Cardiovascular: S1 and S2 heard.  Respiratory: No rhonchi or crepitations.  Abdomen: Soft nontender bowel sounds present.  Skin: No rash.  Musculoskeletal: No edema. Pain ongoing right hip.  Psychiatric: Appears normal.  Neurologic: Alert awake oriented to time place and person. Moves all  extremities.  Labs on Admission:  Basic Metabolic Panel:  Recent Labs Lab 01/30/15 0445 01/31/15 0440 02/05/15 0048  NA 140 139 139  K 4.7 4.8 4.1  CL 105 107 100*  CO2 GLUCOSE 163* 128* 140*  BUN 21* 26* 18  CREATININE 0.82 0.99 0.73  CALCIUM 8.6* 8.4* 8.4*   Liver Function Tests: No results for input(s): AST, ALT, ALKPHOS, BILITOT, PROT, ALBUMIN in the last 168 hours. No results for input(s): LIPASE, AMYLASE in the last 168 hours. No results for input(s): AMMONIA in the last 168 hours. CBC:  Recent Labs Lab 01/30/15 0445 01/31/15 0440 02/05/15 0048  WBC 15.6* 12.9* 12.5*  NEUTROABS  --   --  9.8*  HGB 11.5* 11.2* 11.4*  HCT 36.9 34.0* 34.3*  MCV 89.8 91.9 90.3  PLT 211 212 321   Cardiac Enzymes: No results for input(s): CKTOTAL, CKMB, CKMBINDEX, TROPONINI in the last 168 hours.  BNP (last 3 results) No results for input(s): BNP in the last 8760 hours.  ProBNP (last 3 results) No results for input(s): PROBNP in the last 8760 hours.  CBG:  Recent Labs Lab 01/29/15 1037  GLUCAP 101*    Radiological Exams on Admission: Dg Chest 1 View  02/05/2015   CLINICAL DATA:  Patient with prior right hip replacement. Recent diagnosis right femur fracture.  EXAM: CHEST  1 VIEW  COMPARISON:  Chest radiograph 08/29/2014  FINDINGS: Monitoring leads overlie the patient. Stable enlarged cardiac and mediastinal contours with tortuosity and calcification of the thoracic aorta. Bilateral coarse interstitial opacities. No large consolidative pulmonary opacity. No pleural effusion or pneumothorax.  IMPRESSION: Cardiomegaly.  No acute cardiopulmonary process.   Electronically Signed   By: Annia Belt M.D.   On: 02/05/2015 01:57   Dg Pelvis 1-2 Views  02/05/2015   CLINICAL DATA:  Patient with recent proximal right femur fracture.  EXAM: PELVIS - 1-2 VIEW  COMPARISON:  None.  FINDINGS: Re- demonstrated comminuted proximal right femur fracture about right femoral hardware.  Lower lumbar spine degenerative changes. SI joints are unremarkable. Pelvic phleboliths.  IMPRESSION: Re- demonstrated comminuted proximal right femur fracture about the hardware. Pelvic ring remains intact.   Electronically Signed   By: Annia Belt M.D.   On: 02/05/2015 02:07   Dg Femur, Min 2 Views Right  02/05/2015   CLINICAL DATA:  Patient with recent right hip replacement. Prior radiograph showing proximal right femur fracture.  EXAM: RIGHT FEMUR 2 VIEWS  COMPARISON:  01/29/2015  FINDINGS: Patient status post right hip arthroplasty. Comminuted fracture of the proximal right femur with associated foreshortening. Degenerative changes of the knee. Vascular calcifications. Pelvic phleboliths.  IMPRESSION: Comminuted proximal right femur fracture with associated foreshortening about the femoral stem of the right hip replacement.   Electronically Signed   By: Annia Belt M.D.   On: 02/05/2015 01:55    Assessment/Plan Principal Problem:   Periprosthetic fracture around internal prosthetic right hip joint Active Problems:   Chronic anticoagulation   Essential hypertension -well controlled   Chronic a-fib   Hypothyroidism   1. Periprosthetic right hip fracture - patient is on xarelto and the last dose was last evening 4 p.m. is confirmed with pharmacy. Patient's surgery will be planned after 4 PM today with more than 24 hours after the last dose of xarelto. I did discuss with Dr. Ranell Patrick about patient's last xarelto dose and timing of surgery. Presently holding of xarelto and anticoagulation to be restarted as soon as possible after surgery given history of chronic atrial fibrillation and history of stroke. Patient presently is kept nothing by mouth with pain relief medications. Appreciate orthopedic consult. 2. Chronic atrial fibrillation - since patient is nothing by mouth I had Patient on IV metoprolol 5 mg every 6 hourly scheduled dose which can be changed back to patient's home dose of Cardizem and  bisoprolol once patient can take orally. With regarding to anticoagulation see #1. 3. Hypertension - patient is on IV metoprolol and when necessary IV hydralazine for now and can be changed to oral medications once patient can take orally. 4. Hypothyroidism on IV Synthroid for now until patient can take orally. 5. Anemia probably from blood loss follow CBC. 6. History of stroke. 7. Possible  UTI - follow urine cultures. Patient has been placed on ceftriaxone.  EKG is pending. I have reviewed patient's old charts. I have discussed with on-call orthopedic surgeon Dr. Ranell Patrick.   DVT Prophylaxis SCDs in anticipation of surgery.  Code Status: Full code.  Family Communication: Discussed with patient.  Disposition Plan: Admit to inpatient.    Marsia Cino N. [facility] Pager [phone].  If 7PM-7AM, please contact night-coverage [email or web address] Password [facility] 02/05/2015, 5:36 AM

## 2015-02-05 NOTE — Progress Notes (Addendum)
PROGRESS NOTE    Makayla Thomas ZOX:[id number] DOB: 02-04-1926 DOA: 02/04/2015 PCP: Londell Moh, MD  Primary Cardiologist: Dr. Bryan Lemma  HPI/Brief narrative 81 y.o. female with history of right THA a week ago, chronic A. fib on Xarelto, stroke, HTN, hypothyroid, HLD presented from SNF with 3 days history of increasing right groin and thigh pain. Patient states that while being moved from bed to bedside commode or wheelchair, she was dropped twice. X-rays revealed right periprosthetic hip fracture. Last dose of Xarelto on 6/20. Orthopedics consult at and plan repeat surgery on 6/22 PM  Assessment/Plan:  Periprosthetic right proximal femur fracture - s/p right THA on 6/14 and tolerated procedure well. - Currently presented with periprosthetic right femoral fracture-? Related to trauma during movements - Orthopedics consultation appreciated and as per nursing report, plan surgery 6/22 - Xarelto last dose was on 02/04/15 at 4 PM  Chronic A. Fib - Patient on Cardizem and bisoprolol at home. Currently held secondary to nothing by mouth and placed on scheduled IV metoprolol - Controlled ventricular rate - Resume oral medications postoperatively - Anticoagulation held for surgery and to be resumed postoperatively  Essential hypertension - Controlled - Continue IV metoprolol scheduled and PRN IV hydralazine  Hypothyroid - On IV Synthroid  Anemia - Stable. Follow CBC postop  History of stroke - Start anticoagulation postop  Possible UTI - Continue IV Rocephin pending urine culture results    Code Status: Full Family Communication: None at bedside Disposition Plan: DC to SNF when medically stable   Consultants:  Orthopedics  Procedures:  None  Antibiotics:  None   Subjective: Mild right hip pain with movement. No chest pain, dyspnea, palpitations or cough. Denies any other complaints.  Objective: Filed Vitals:   02/05/15 0700 02/05/15 0740 02/05/15  1208 02/05/15 1350  BP:   118/66 110/61  Pulse:   69 83  Temp:   97.7 F (36.5 C) 98.1 F (36.7 C)  TempSrc:   Oral Oral  Resp:    15  Height: 5' 0.5" (1.537 m)     Weight:  83.8 kg (184 lb 11.9 oz)    SpO2:   98% 92%    Intake/Output Summary (Last 24 hours) at 02/05/15 1657 Last data filed at 02/05/15 0503  Gross per 24 hour  Intake      0 ml  Output    550 ml  Net   -550 ml   Filed Weights   02/05/15 0740  Weight: 83.8 kg (184 lb 11.9 oz)     Exam:  General exam: Pleasant elderly female lying comfortably supine in bed Respiratory system: Clear. No increased work of breathing. Cardiovascular system: S1 & S2 heard, irregularly irregular. No JVD, murmurs, gallops, clicks or pedal edema. Gastrointestinal system: Abdomen is nondistended, soft and nontender. Normal bowel sounds heard. Central nervous system: Alert and oriented. No focal neurological deficits. Extremities: Symmetric 5 x 5 power except right lower extremity which was not assessed secondary to pain. RLE shortened and internally rotated.   Data Reviewed: Basic Metabolic Panel:  Recent Labs Lab 01/30/15 0445 01/31/15 0440 02/05/15 0048 02/05/15 0623  NA 140 139 139 137  K 4.7 4.8 4.1 4.6  CL 105 107 100* 99*  CO2 GLUCOSE 163* 128* 140* 138*  BUN 21* 26* 18 17  CREATININE 0.82 0.99 0.73 0.77  CALCIUM 8.6* 8.4* 8.4* 8.4*   Liver Function Tests:  Recent Labs Lab 02/05/15 0623  AST 20  ALT 17  ALKPHOS 94  BILITOT 0.7  PROT 6.3*  ALBUMIN 3.1*   No results for input(s): LIPASE, AMYLASE in the last 168 hours. No results for input(s): AMMONIA in the last 168 hours. CBC:  Recent Labs Lab 01/30/15 0445 01/31/15 0440 02/05/15 0048 02/05/15 0623  WBC 15.6* 12.9* 12.5* 11.5*  NEUTROABS  --   --  9.8*  --   HGB 11.5* 11.2* 11.4* 10.6*  HCT 36.9 34.0* 34.3* 33.0*  MCV 89.8 91.9 90.3 91.4  PLT 211 212 321 301   Cardiac Enzymes: No results for input(s): CKTOTAL, CKMB, CKMBINDEX,  TROPONINI in the last 168 hours. BNP (last 3 results) No results for input(s): PROBNP in the last 8760 hours. CBG:  Recent Labs Lab 02/05/15 0728 02/05/15 1204  GLUCAP 132* 125*    No results found for this or any previous visit (from the past 240 hour(s)).        Studies: Dg Chest 1 View  02/05/2015   CLINICAL DATA:  Patient with prior right hip replacement. Recent diagnosis right femur fracture.  EXAM: CHEST  1 VIEW  COMPARISON:  Chest radiograph 08/29/2014  FINDINGS: Monitoring leads overlie the patient. Stable enlarged cardiac and mediastinal contours with tortuosity and calcification of the thoracic aorta. Bilateral coarse interstitial opacities. No large consolidative pulmonary opacity. No pleural effusion or pneumothorax.  IMPRESSION: Cardiomegaly.  No acute cardiopulmonary process.   Electronically Signed   By: Annia Belt M.D.   On: 02/05/2015 01:57   Dg Pelvis 1-2 Views  02/05/2015   CLINICAL DATA:  Patient with recent proximal right femur fracture.  EXAM: PELVIS - 1-2 VIEW  COMPARISON:  None.  FINDINGS: Re- demonstrated comminuted proximal right femur fracture about right femoral hardware. Lower lumbar spine degenerative changes. SI joints are unremarkable. Pelvic phleboliths.  IMPRESSION: Re- demonstrated comminuted proximal right femur fracture about the hardware. Pelvic ring remains intact.   Electronically Signed   By: Annia Belt M.D.   On: 02/05/2015 02:07   Dg Femur, Min 2 Views Right  02/05/2015   CLINICAL DATA:  Patient with recent right hip replacement. Prior radiograph showing proximal right femur fracture.  EXAM: RIGHT FEMUR 2 VIEWS  COMPARISON:  01/29/2015  FINDINGS: Patient status post right hip arthroplasty. Comminuted fracture of the proximal right femur with associated foreshortening. Degenerative changes of the knee. Vascular calcifications. Pelvic phleboliths.  IMPRESSION: Comminuted proximal right femur fracture with associated foreshortening about the  femoral stem of the right hip replacement.   Electronically Signed   By: Annia Belt M.D.   On: 02/05/2015 01:55        Scheduled Meds: . antiseptic oral rinse  7 mL Mouth Rinse q12n4p  . cefTRIAXone (ROCEPHIN)  IV  1 g Intravenous Q24H  . chlorhexidine  15 mL Mouth Rinse BID  . [START ON 02/06/2015] feeding supplement (ENSURE ENLIVE)  237 mL Oral BID BM  . levothyroxine  62.5 mcg Intravenous Daily  . metoprolol  5 mg Intravenous 4 times per day   Continuous Infusions:   Principal Problem:   Periprosthetic fracture around internal prosthetic right hip joint Active Problems:   Chronic anticoagulation   Essential hypertension -well controlled   Chronic a-fib   Hypothyroidism    Time spent: 30 minutes    Joyleen Haselton, MD, FACP, FHM. [facility] Pager [phone]  If 7PM-7AM, please contact night-coverage [email or web address] Password [facility] 02/05/2015, 4:57 PM    LOS: 0 days

## 2015-02-05 NOTE — ED Notes (Signed)
Patient transported to X-ray 

## 2015-02-05 NOTE — Clinical Social Work Placement (Signed)
   CLINICAL SOCIAL WORK PLACEMENT  NOTE  Date:  02/05/2015  Patient Details  Name: Makayla Thomas MRN: [id number] Date of Birth: 01/10/1926  Clinical Social Work is seeking post-discharge placement for this patient at the Skilled  Nursing Facility level of care (*CSW will initial, date and re-position this form in  chart as items are completed):  Yes   Patient/family provided with [place] Clinical Social Work Department's list of facilities offering this level of care within the geographic area requested by the patient (or if unable, by the patient's family).  Yes   Patient/family informed of their freedom to choose among providers that offer the needed level of care, that participate in Medicare, Medicaid or managed care program needed by the patient, have an available bed and are willing to accept the patient.  Yes   Patient/family informed of [place]'s ownership interest in [facility] and [facility], as well as of the fact that they are under no obligation to receive care at these facilities.  PASRR submitted to EDS on       PASRR number received on       Existing PASRR number confirmed on 02/05/15     FL2 transmitted to all facilities in geographic area requested by pt/family on 02/05/15     FL2 transmitted to all facilities within larger geographic area on       Patient informed that his/her managed care company has contracts with or will negotiate with certain facilities, including the following:            Patient/family informed of bed offers received.  Patient chooses bed at       Physician recommends and patient chooses bed at      Patient to be transferred to   on  .  Patient to be transferred to facility by       Patient family notified on   of transfer.  Name of family member notified:        PHYSICIAN       Additional Comment:    _______________________________________________ Royetta Asal, LCSW 02/05/2015, 2:31 PM

## 2015-02-05 NOTE — ED Notes (Signed)
Admitting dr in room

## 2015-02-06 DIAGNOSIS — N3000 Acute cystitis without hematuria: Secondary | ICD-10-CM

## 2015-02-06 DIAGNOSIS — I10 Essential (primary) hypertension: Secondary | ICD-10-CM

## 2015-02-06 LAB — GLUCOSE, CAPILLARY
GLUCOSE-CAPILLARY: 91 mg/dL (ref 65–99)
GLUCOSE-CAPILLARY: 131 mg/dL — AB (ref 65–99)
Glucose-Capillary: 107 mg/dL — ABNORMAL HIGH (ref 65–99)
Glucose-Capillary: 131 mg/dL — ABNORMAL HIGH (ref 65–99)
Glucose-Capillary: 149 mg/dL — ABNORMAL HIGH (ref 65–99)
Glucose-Capillary: 90 mg/dL (ref 65–99)

## 2015-02-06 LAB — VITAMIN D 25 HYDROXY (VIT D DEFICIENCY, FRACTURES): Vit D, 25-Hydroxy: 31 ng/mL (ref 30.0–100.0)

## 2015-02-06 LAB — SURGICAL PCR SCREEN
MRSA, PCR: NEGATIVE
Staphylococcus aureus: POSITIVE — AB

## 2015-02-06 MED ORDER — SODIUM CHLORIDE 0.9 % IV SOLN
INTRAVENOUS | Status: DC
Start: 2015-02-06 — End: 2015-02-06

## 2015-02-06 MED ORDER — CHLORHEXIDINE GLUCONATE 4 % EX LIQD
Freq: Once | CUTANEOUS | Status: DC
  Filled 2015-02-06: qty 15

## 2015-02-06 MED ORDER — SODIUM CHLORIDE 0.9 % IV SOLN
INTRAVENOUS | Status: DC
  Administered 2015-02-07 (×2): via INTRAVENOUS

## 2015-02-06 NOTE — Progress Notes (Signed)
     Subjective: Planned Procedure(s) (LRB): OPEN REDUCTION INTERNAL FIXATION (ORIF) PERIPROSTHETIC FRACTURE (Right)   Patient reports pain as mild, while resting in bed.   Discussed the case with patient, son and daughter-in-law. They were appreciative.  Told that we would keep everyone informed as we find out information.   Objective:   VITALS:   Filed Vitals:   02/06/15  BP: 102/52  Pulse: 66  Temp: 97.6 F (36.4 C)   Resp: 14    Dorsiflexion/Plantar flexion intact Incision: no drainage No cellulitis present Compartment soft  LABS  Recent Labs  02/05/15 0048 02/05/15 0623  HGB 11.4* 10.6*  HCT 34.3* 33.0*  WBC 12.5* 11.5*  PLT 321 301     Recent Labs  02/05/15 0048 02/05/15 0623  NA 139 137  K 4.1 4.6  BUN 18 17  CREATININE 0.73 0.77  GLUCOSE 140* 138*     Assessment/Plan: Planned Procedure(s) (LRB): OPEN REDUCTION INTERNAL FIXATION (ORIF) PERIPROSTHETIC FRACTURE (Right)  Discussed the surgery with the patient, son and daughter-in-law At this time the OR isn't able to work out a good time today to do surgery. She will be given a regular diet today Plan for surgery tomorrow (02/07/2015) Will obtain consent for and ORIF of the right hip periprosthetic fx. NPO after midnight   Anastasio Auerbach. Lonzie Simmer   PAC  02/06/2015, 9:03 AM

## 2015-02-06 NOTE — Progress Notes (Signed)
PROGRESS NOTE    Makayla Thomas ZOX:[id number] DOB: 1925/12/27 DOA: 02/04/2015 PCP: Londell Moh, MD  Primary Cardiologist: Dr. Bryan Lemma  HPI/Brief narrative 81 y.o. female with history of right THA a week ago, chronic A. fib on Xarelto, stroke, HTN, hypothyroid, HLD presented from SNF with 3 days history of increasing right groin and thigh pain. Patient states that while being moved from bed to bedside commode or wheelchair, she was dropped twice. X-rays revealed right periprosthetic hip fracture. Last dose of Xarelto on 6/20. Orthopedics consult at and plan repeat surgery on 6/22 PM  Assessment/Plan:  Periprosthetic right proximal femur fracture - s/p right THA on 6/14 and tolerated procedure well. - Currently presented with periprosthetic right femoral fracture-? Related to trauma during movements - Orthopedics consultation appreciated and plan surgery 6/22. Discussed with Dr. Charlann Boxer 6/21. - Xarelto last dose was on 02/04/15 at 4 PM  Chronic A. Fib - Patient on Cardizem and bisoprolol at home. Currently held secondary to nothing by mouth and placed on scheduled IV metoprolol - Controlled ventricular rate - Resume oral medications postoperatively - Anticoagulation held for surgery and to be resumed postoperatively  Essential hypertension - Controlled - Continue IV metoprolol scheduled and PRN IV hydralazine  Hypothyroid - On IV Synthroid  Anemia - Stable. Follow CBC postop  History of stroke - Start anticoagulation postop  Possible gram-negative rod UTI - Continue IV Rocephin pending urine culture results    Code Status: Full Family Communication: Discussed with patient's son and daughter-in-law at bedside on 6/22 Disposition Plan: DC to SNF when medically stable   Consultants:  Orthopedics  Procedures:  Foley catheter  Antibiotics:  None   Subjective: Mild right hip pain with movement. No chest pain, dyspnea, palpitations or cough. Denies any  other complaints. No new complaints reported. No acute issues per nursing.   Objective: Filed Vitals:   02/05/15 2143 02/06/15 0653 02/06/15 0659 02/06/15 1013  BP: 101/68 105/62 102/52 103/52  Pulse: 89 74 66 76  Temp: 97.7 F (36.5 C) 97.6 F (36.4 C)  99.5 F (37.5 C)  TempSrc: Oral Oral  Oral  Resp: Height:      Weight:      SpO2: 97% 98%  99%    Intake/Output Summary (Last 24 hours) at 02/06/15 1738 Last data filed at 02/06/15 1014  Gross per 24 hour  Intake    840 ml  Output   1050 ml  Net   -210 ml   Filed Weights   02/05/15 0740  Weight: 83.8 kg (184 lb 11.9 oz)     Exam:  General exam: Pleasant elderly female lying comfortably supine in bed Respiratory system: Clear. No increased work of breathing. Cardiovascular system: S1 & S2 heard, irregularly irregular. No JVD, murmurs, gallops, clicks or pedal edema. Gastrointestinal system: Abdomen is nondistended, soft and nontender. Normal bowel sounds heard. Foley+ Central nervous system: Alert and oriented. No focal neurological deficits. Extremities: Symmetric 5 x 5 power except right lower extremity which was not assessed secondary to pain. RLE shortened and internally rotated.   Data Reviewed: Basic Metabolic Panel:  Recent Labs Lab 01/31/15 0440 02/05/15 0048 02/05/15 0623  NA 139 139 137  K 4.8 4.1 4.6  CL 107 100* 99*  CO2 GLUCOSE 128* 140* 138*  BUN 26* 18 17  CREATININE 0.99 0.73 0.77  CALCIUM 8.4* 8.4* 8.4*   Liver Function Tests:  Recent Labs Lab 02/05/15 0623  AST 20  ALT 17  ALKPHOS 94  BILITOT 0.7  PROT 6.3*  ALBUMIN 3.1*   No results for input(s): LIPASE, AMYLASE in the last 168 hours. No results for input(s): AMMONIA in the last 168 hours. CBC:  Recent Labs Lab 01/31/15 0440 02/05/15 0048 02/05/15 0623  WBC 12.9* 12.5* 11.5*  NEUTROABS  --  9.8*  --   HGB 11.2* 11.4* 10.6*  HCT 34.0* 34.3* 33.0*  MCV 91.9 90.3 91.4  PLT 212 321 301   Cardiac  Enzymes: No results for input(s): CKTOTAL, CKMB, CKMBINDEX, TROPONINI in the last 168 hours. BNP (last 3 results) No results for input(s): PROBNP in the last 8760 hours. CBG:  Recent Labs Lab 02/06/15 0001 02/06/15 0412 02/06/15 0621 02/06/15 1017 02/06/15 1226  GLUCAP 131* 90 107* 91 131*    Recent Results (from the past 240 hour(s))  Culture, Urine     Status: None (Preliminary result)   Collection Time: 02/05/15  3:45 AM  Result Value Ref Range Status   Specimen Description URINE, CATHETERIZED  Final   Special Requests NONE  Final   Culture   Final    >=100,000 COLONIES/mL GRAM NEGATIVE RODS Performed at [facility]    Report Status PENDING  Incomplete  Surgical pcr screen     Status: Abnormal   Collection Time: 02/06/15 12:17 AM  Result Value Ref Range Status   MRSA, PCR NEGATIVE NEGATIVE Final   Staphylococcus aureus POSITIVE (A) NEGATIVE Final    Comment:        The Xpert SA Assay (FDA approved for NASAL specimens in patients over 67 years of age), is one component of a comprehensive surveillance program.  Test performance has been validated by [facility] for patients greater than or equal to 81 year old. It is not intended to diagnose infection nor to guide or monitor treatment.           Studies: Dg Chest 1 View  02/05/2015   CLINICAL DATA:  Patient with prior right hip replacement. Recent diagnosis right femur fracture.  EXAM: CHEST  1 VIEW  COMPARISON:  Chest radiograph 08/29/2014  FINDINGS: Monitoring leads overlie the patient. Stable enlarged cardiac and mediastinal contours with tortuosity and calcification of the thoracic aorta. Bilateral coarse interstitial opacities. No large consolidative pulmonary opacity. No pleural effusion or pneumothorax.  IMPRESSION: Cardiomegaly.  No acute cardiopulmonary process.   Electronically Signed   By: Annia Belt M.D.   On: 02/05/2015 01:57   Dg Pelvis 1-2 Views  02/05/2015   CLINICAL DATA:  Patient  with recent proximal right femur fracture.  EXAM: PELVIS - 1-2 VIEW  COMPARISON:  None.  FINDINGS: Re- demonstrated comminuted proximal right femur fracture about right femoral hardware. Lower lumbar spine degenerative changes. SI joints are unremarkable. Pelvic phleboliths.  IMPRESSION: Re- demonstrated comminuted proximal right femur fracture about the hardware. Pelvic ring remains intact.   Electronically Signed   By: Annia Belt M.D.   On: 02/05/2015 02:07   Dg Femur, Min 2 Views Right  02/05/2015   CLINICAL DATA:  Patient with recent right hip replacement. Prior radiograph showing proximal right femur fracture.  EXAM: RIGHT FEMUR 2 VIEWS  COMPARISON:  01/29/2015  FINDINGS: Patient status post right hip arthroplasty. Comminuted fracture of the proximal right femur with associated foreshortening. Degenerative changes of the knee. Vascular calcifications. Pelvic phleboliths.  IMPRESSION: Comminuted proximal right femur fracture with associated foreshortening about the femoral stem of the right hip replacement.   Electronically Signed   By:  Annia Belt M.D.   On: 02/05/2015 01:55        Scheduled Meds: . antiseptic oral rinse  7 mL Mouth Rinse q12n4p  . cefTRIAXone (ROCEPHIN)  IV  1 g Intravenous Q24H  . chlorhexidine   Topical Once  . chlorhexidine  15 mL Mouth Rinse BID  . feeding supplement (ENSURE ENLIVE)  237 mL Oral BID BM  . levothyroxine  62.5 mcg Intravenous Daily  . metoprolol  5 mg Intravenous 4 times per day   Continuous Infusions: . sodium chloride      Principal Problem:   Periprosthetic fracture around internal prosthetic right hip joint Active Problems:   Chronic anticoagulation   Essential hypertension -well controlled   Chronic a-fib   Hypothyroidism    Time spent: 20 minutes    Shunta Mclaurin, MD, FACP, FHM. [facility] Pager [phone]  If 7PM-7AM, please contact night-coverage [email or web address] Password [email or web address] 02/06/2015, 5:38 PM    LOS: 1 day

## 2015-02-07 ENCOUNTER — Encounter (HOSPITAL_COMMUNITY)
Admission: EM | Disposition: A | Discharge status: Skilled Nursing Facility | Payer: Self-pay | Source: Home / Self Care | Attending: Internal Medicine

## 2015-02-07 ENCOUNTER — Inpatient Hospital Stay (HOSPITAL_COMMUNITY): Payer: Medicare Other

## 2015-02-07 ENCOUNTER — Inpatient Hospital Stay (HOSPITAL_COMMUNITY): Payer: Medicare Other | Admitting: Anesthesiology

## 2015-02-07 HISTORY — PX: ORIF PERIPROSTHETIC FRACTURE: SHX5034

## 2015-02-07 LAB — BASIC METABOLIC PANEL
Anion gap: 9 (ref 5–15)
BUN: 18 mg/dL (ref 6–20)
CALCIUM: 8.1 mg/dL — AB (ref 8.9–10.3)
CO2: 28 mmol/L (ref 22–32)
Chloride: 101 mmol/L (ref 101–111)
Creatinine, Ser: 0.66 mg/dL (ref 0.44–1.00)
GFR calc Af Amer: >60 mL/min (ref >60)
GLUCOSE: 111 mg/dL — AB (ref 65–99)
Potassium: 4.6 mmol/L (ref 3.5–5.1)
Sodium: 138 mmol/L (ref 135–145)

## 2015-02-07 LAB — CBC
HCT: 29.3 % — ABNORMAL LOW (ref 36.0–46.0)
Hemoglobin: 9.7 g/dL — ABNORMAL LOW (ref 12.0–15.0)
MCH: 30.7 pg (ref 26.0–34.0)
MCHC: 33.1 g/dL (ref 30.0–36.0)
MCV: 92.7 fL (ref 78.0–100.0)
Platelets: 302 10*3/uL (ref 150–400)
RBC: 3.16 MIL/uL — ABNORMAL LOW (ref 3.87–5.11)
RDW: 15.5 % (ref 11.5–15.5)
WBC: 9 10*3/uL (ref 4.0–10.5)

## 2015-02-07 LAB — GLUCOSE, CAPILLARY
GLUCOSE-CAPILLARY: 106 mg/dL — AB (ref 65–99)
GLUCOSE-CAPILLARY: 106 mg/dL — AB (ref 65–99)
GLUCOSE-CAPILLARY: 139 mg/dL — AB (ref 65–99)
Glucose-Capillary: 103 mg/dL — ABNORMAL HIGH (ref 65–99)
Glucose-Capillary: 157 mg/dL — ABNORMAL HIGH (ref 65–99)

## 2015-02-07 LAB — URINE CULTURE

## 2015-02-07 LAB — PREPARE RBC (CROSSMATCH)

## 2015-02-07 SURGERY — OPEN REDUCTION INTERNAL FIXATION (ORIF) PERIPROSTHETIC FRACTURE
Anesthesia: General | Site: Hip | Laterality: Right

## 2015-02-07 MED ORDER — METOPROLOL TARTRATE 1 MG/ML IV SOLN
INTRAVENOUS | Status: DC | PRN
  Administered 2015-02-07 (×2): 1 mg via INTRAVENOUS

## 2015-02-07 MED ORDER — MIDAZOLAM HCL 2 MG/2ML IJ SOLN
INTRAMUSCULAR | Status: AC
  Filled 2015-02-07: qty 2

## 2015-02-07 MED ORDER — FENTANYL CITRATE (PF) 100 MCG/2ML IJ SOLN
INTRAMUSCULAR | Status: DC | PRN
  Administered 2015-02-07: 50 ug via INTRAVENOUS
  Administered 2015-02-07: 100 ug via INTRAVENOUS
  Administered 2015-02-07 (×2): 50 ug via INTRAVENOUS

## 2015-02-07 MED ORDER — ALUM & MAG HYDROXIDE-SIMETH 200-200-20 MG/5ML PO SUSP: 30.0000 mL | ORAL | Status: DC | PRN

## 2015-02-07 MED ORDER — PROPOFOL 10 MG/ML IV BOLUS
INTRAVENOUS | Status: DC | PRN
  Administered 2015-02-07: 100 mg via INTRAVENOUS

## 2015-02-07 MED ORDER — FENTANYL CITRATE (PF) 100 MCG/2ML IJ SOLN
25.0000 ug | INTRAMUSCULAR | Status: DC | PRN
  Administered 2015-02-07 (×4): 25 ug via INTRAVENOUS

## 2015-02-07 MED ORDER — SODIUM CHLORIDE 0.9 % IV SOLN
INTRAVENOUS | Status: DC
  Administered 2015-02-08 – 2015-02-09 (×2): via INTRAVENOUS
  Filled 2015-02-07 (×7): qty 1000

## 2015-02-07 MED ORDER — ONDANSETRON HCL 4 MG/2ML IJ SOLN
4.0000 mg | Freq: Four times a day (QID) | INTRAMUSCULAR | Status: DC | PRN
  Filled 2015-02-07: qty 2

## 2015-02-07 MED ORDER — ONDANSETRON HCL 4 MG/2ML IJ SOLN
INTRAMUSCULAR | Status: AC
  Filled 2015-02-07: qty 2

## 2015-02-07 MED ORDER — PROMETHAZINE HCL 25 MG/ML IJ SOLN: 6.2500 mg | INTRAMUSCULAR | Status: DC | PRN

## 2015-02-07 MED ORDER — MUPIROCIN 2 % EX OINT
1.0000 "application " | TOPICAL_OINTMENT | Freq: Two times a day (BID) | CUTANEOUS | Status: AC
  Administered 2015-02-07 – 2015-02-11 (×10): 1 via NASAL
  Filled 2015-02-07: qty 22

## 2015-02-07 MED ORDER — LIDOCAINE HCL (CARDIAC) 20 MG/ML IV SOLN
INTRAVENOUS | Status: DC | PRN
  Administered 2015-02-07: 50 mg via INTRAVENOUS

## 2015-02-07 MED ORDER — METOCLOPRAMIDE HCL 10 MG PO TABS: 5.0000 mg | ORAL_TABLET | Freq: Three times a day (TID) | ORAL | Status: DC | PRN

## 2015-02-07 MED ORDER — MEPERIDINE HCL 25 MG/ML IJ SOLN: 6.2500 mg | INTRAMUSCULAR | Status: DC | PRN

## 2015-02-07 MED ORDER — RIVAROXABAN 10 MG PO TABS
10.0000 mg | ORAL_TABLET | Freq: Every day | ORAL | Status: DC
  Administered 2015-02-08 – 2015-02-10 (×3): 10 mg via ORAL
  Filled 2015-02-07 (×4): qty 1

## 2015-02-07 MED ORDER — CEFAZOLIN SODIUM 1-5 GM-% IV SOLN
1.0000 g | Freq: Four times a day (QID) | INTRAVENOUS | Status: DC
  Administered 2015-02-07 – 2015-02-08 (×2): 1 g via INTRAVENOUS
  Filled 2015-02-07 (×3): qty 50

## 2015-02-07 MED ORDER — METOPROLOL TARTRATE 1 MG/ML IV SOLN
5.0000 mg | Freq: Once | INTRAVENOUS | Status: AC
  Administered 2015-02-07: 5 mg via INTRAVENOUS
  Filled 2015-02-07: qty 5

## 2015-02-07 MED ORDER — DOCUSATE SODIUM 100 MG PO CAPS
100.0000 mg | ORAL_CAPSULE | Freq: Two times a day (BID) | ORAL | Status: DC
  Administered 2015-02-08 – 2015-02-10 (×5): 100 mg via ORAL
  Filled 2015-02-07 (×7): qty 1

## 2015-02-07 MED ORDER — GLYCOPYRROLATE 0.2 MG/ML IJ SOLN
INTRAMUSCULAR | Status: AC
  Filled 2015-02-07: qty 2

## 2015-02-07 MED ORDER — HYDROMORPHONE HCL 1 MG/ML IJ SOLN
0.5000 mg | INTRAMUSCULAR | Status: DC | PRN
  Administered 2015-02-11 – 2015-02-12 (×2): 0.5 mg via INTRAVENOUS
  Filled 2015-02-07 (×2): qty 1

## 2015-02-07 MED ORDER — ARTIFICIAL TEARS OP OINT
TOPICAL_OINTMENT | OPHTHALMIC | Status: AC
  Filled 2015-02-07: qty 3.5

## 2015-02-07 MED ORDER — ACETAMINOPHEN 10 MG/ML IV SOLN
INTRAVENOUS | Status: AC
  Filled 2015-02-07: qty 100

## 2015-02-07 MED ORDER — ONDANSETRON HCL 4 MG/2ML IJ SOLN
INTRAMUSCULAR | Status: DC | PRN
  Administered 2015-02-07: 4 mg via INTRAVENOUS

## 2015-02-07 MED ORDER — ACETAMINOPHEN 650 MG RE SUPP: 650.0000 mg | Freq: Four times a day (QID) | RECTAL | Status: DC | PRN

## 2015-02-07 MED ORDER — FENTANYL CITRATE (PF) 100 MCG/2ML IJ SOLN
INTRAMUSCULAR | Status: AC
  Filled 2015-02-07: qty 2

## 2015-02-07 MED ORDER — NEOSTIGMINE METHYLSULFATE 10 MG/10ML IV SOLN
INTRAVENOUS | Status: AC
  Filled 2015-02-07: qty 1

## 2015-02-07 MED ORDER — POLYETHYLENE GLYCOL 3350 17 G PO PACK: 17.0000 g | PACK | Freq: Every day | ORAL | Status: DC | PRN

## 2015-02-07 MED ORDER — MENTHOL 3 MG MT LOZG: 1.0000 | LOZENGE | OROMUCOSAL | Status: DC | PRN

## 2015-02-07 MED ORDER — SUCCINYLCHOLINE CHLORIDE 20 MG/ML IJ SOLN
INTRAMUSCULAR | Status: DC | PRN
Start: 2015-02-07 — End: 2015-02-07
  Administered 2015-02-07: 60 mg via INTRAVENOUS

## 2015-02-07 MED ORDER — PHENOL 1.4 % MT LIQD
1.0000 | OROMUCOSAL | Status: DC | PRN
  Filled 2015-02-07: qty 177

## 2015-02-07 MED ORDER — METOCLOPRAMIDE HCL 5 MG/ML IJ SOLN: 5.0000 mg | Freq: Three times a day (TID) | INTRAMUSCULAR | Status: DC | PRN

## 2015-02-07 MED ORDER — ACETAMINOPHEN 10 MG/ML IV SOLN
500.0000 mg | Freq: Once | INTRAVENOUS | Status: AC
  Administered 2015-02-07: 500 mg via INTRAVENOUS

## 2015-02-07 MED ORDER — CEFAZOLIN SODIUM-DEXTROSE 2-3 GM-% IV SOLR
INTRAVENOUS | Status: AC
  Filled 2015-02-07: qty 50

## 2015-02-07 MED ORDER — ONDANSETRON HCL 4 MG PO TABS
4.0000 mg | ORAL_TABLET | Freq: Four times a day (QID) | ORAL | Status: DC | PRN
Start: 2015-02-07 — End: 2015-02-12

## 2015-02-07 MED ORDER — FENTANYL CITRATE (PF) 250 MCG/5ML IJ SOLN
INTRAMUSCULAR | Status: AC
  Filled 2015-02-07: qty 5

## 2015-02-07 MED ORDER — LACTATED RINGERS IV SOLN
INTRAVENOUS | Status: DC | PRN
  Administered 2015-02-07: 17:00:00 via INTRAVENOUS

## 2015-02-07 MED ORDER — FERROUS SULFATE 325 (65 FE) MG PO TABS
325.0000 mg | ORAL_TABLET | Freq: Three times a day (TID) | ORAL | Status: DC
  Administered 2015-02-08 – 2015-02-12 (×11): 325 mg via ORAL
  Filled 2015-02-07 (×16): qty 1

## 2015-02-07 MED ORDER — CHLORHEXIDINE GLUCONATE CLOTH 2 % EX PADS
6.0000 | MEDICATED_PAD | Freq: Every day | CUTANEOUS | Status: AC
  Administered 2015-02-08 – 2015-02-11 (×4): 6 via TOPICAL

## 2015-02-07 MED ORDER — LACTATED RINGERS IV SOLN
INTRAVENOUS | Status: DC | PRN
  Administered 2015-02-07 (×2): via INTRAVENOUS

## 2015-02-07 MED ORDER — CEFAZOLIN SODIUM-DEXTROSE 2-3 GM-% IV SOLR
INTRAVENOUS | Status: DC | PRN
  Administered 2015-02-07: 2 g via INTRAVENOUS

## 2015-02-07 MED ORDER — ROCURONIUM BROMIDE 100 MG/10ML IV SOLN
INTRAVENOUS | Status: AC
  Filled 2015-02-07: qty 1

## 2015-02-07 MED ORDER — METOPROLOL TARTRATE 1 MG/ML IV SOLN
INTRAVENOUS | Status: AC
  Filled 2015-02-07: qty 5

## 2015-02-07 MED ORDER — ACETAMINOPHEN 325 MG PO TABS
650.0000 mg | ORAL_TABLET | Freq: Four times a day (QID) | ORAL | Status: DC | PRN
  Administered 2015-02-08 – 2015-02-11 (×6): 650 mg via ORAL
  Filled 2015-02-07 (×7): qty 2

## 2015-02-07 MED ORDER — SODIUM CHLORIDE 0.9 % IV SOLN
10.0000 mg | INTRAVENOUS | Status: DC | PRN
  Administered 2015-02-07: 80 ug/min via INTRAVENOUS

## 2015-02-07 SURGICAL SUPPLY — 53 items
BAG ZIPLOCK 12X15 (MISCELLANEOUS) ×3 IMPLANT
CABLE WITH CRIMP (Cable) ×12 IMPLANT
CLOSURE WOUND 1/2 X4 (GAUZE/BANDAGES/DRESSINGS)
DRAPE INCISE IOBAN 66X45 STRL (DRAPES) ×3 IMPLANT
DRAPE ORTHO SPLIT 77X108 STRL (DRAPES) ×4
DRAPE POUCH INSTRU U-SHP 10X18 (DRAPES) ×3 IMPLANT
DRAPE SURG 17X11 SM STRL (DRAPES) ×3 IMPLANT
DRAPE SURG ORHT 6 SPLT 77X108 (DRAPES) ×2 IMPLANT
DRAPE U-SHAPE 47X51 STRL (DRAPES) ×3 IMPLANT
DRSG EMULSION OIL 3X16 NADH (GAUZE/BANDAGES/DRESSINGS) ×3 IMPLANT
DRSG PAD ABDOMINAL 8X10 ST (GAUZE/BANDAGES/DRESSINGS) ×6 IMPLANT
DURAPREP 26ML APPLICATOR (WOUND CARE) ×3 IMPLANT
ELECT BLADE TIP CTD 4 INCH (ELECTRODE) ×3 IMPLANT
ELECT REM PT RETURN 9FT ADLT (ELECTROSURGICAL) ×3
ELECTRODE REM PT RTRN 9FT ADLT (ELECTROSURGICAL) ×1 IMPLANT
EVACUATOR 1/8 PVC DRAIN (DRAIN) ×3 IMPLANT
FACESHIELD WRAPAROUND (MASK) ×12 IMPLANT
GAUZE SPONGE 4X4 12PLY STRL (GAUZE/BANDAGES/DRESSINGS) ×3 IMPLANT
GAUZE XEROFORM 5X9 LF (GAUZE/BANDAGES/DRESSINGS) ×3 IMPLANT
GLOVE BIOGEL PI IND STRL 7.5 (GLOVE) ×1 IMPLANT
GLOVE BIOGEL PI IND STRL 8.5 (GLOVE) ×1 IMPLANT
GLOVE BIOGEL PI INDICATOR 7.5 (GLOVE) ×2
GLOVE BIOGEL PI INDICATOR 8.5 (GLOVE) ×2
GLOVE ECLIPSE 8.0 STRL XLNG CF (GLOVE) IMPLANT
GLOVE ORTHO TXT STRL SZ7.5 (GLOVE) ×3 IMPLANT
GLOVE SURG ORTHO 8.0 STRL STRW (GLOVE) ×3 IMPLANT
GOWN SPEC L3 XXLG W/TWL (GOWN DISPOSABLE) ×6 IMPLANT
GOWN STRL REUS W/TWL LRG LVL3 (GOWN DISPOSABLE) ×3 IMPLANT
IMMOBILIZER KNEE 20 (SOFTGOODS)
IMMOBILIZER KNEE 20 THIGH 36 (SOFTGOODS) IMPLANT
KIT BASIN OR (CUSTOM PROCEDURE TRAY) ×3 IMPLANT
MANIFOLD NEPTUNE II (INSTRUMENTS) ×3 IMPLANT
NS IRRIG 1000ML POUR BTL (IV SOLUTION) ×6 IMPLANT
PACK TOTAL JOINT (CUSTOM PROCEDURE TRAY) ×3 IMPLANT
PAD ABD 8X10 STRL (GAUZE/BANDAGES/DRESSINGS) ×3 IMPLANT
PASSER SUT SWANSON 36MM LOOP (INSTRUMENTS) IMPLANT
POSITIONER SURGICAL ARM (MISCELLANEOUS) ×3 IMPLANT
SOLUTION SM 8 16.5 (Hips) ×3 IMPLANT
SPONGE LAP 18X18 X RAY DECT (DISPOSABLE) IMPLANT
SPONGE LAP 4X18 X RAY DECT (DISPOSABLE) IMPLANT
STAPLER VISISTAT 35W (STAPLE) ×6 IMPLANT
STRIP CLOSURE SKIN 1/2X4 (GAUZE/BANDAGES/DRESSINGS) IMPLANT
SUCTION FRAZIER TIP 10 FR DISP (SUCTIONS) ×6 IMPLANT
SUT ETHIBOND NAB CT1 #1 30IN (SUTURE) IMPLANT
SUT MNCRL AB 4-0 PS2 18 (SUTURE) IMPLANT
SUT VIC AB 1 CT1 36 (SUTURE) ×9 IMPLANT
SUT VIC AB 2-0 CT1 27 (SUTURE) ×6
SUT VIC AB 2-0 CT1 TAPERPNT 27 (SUTURE) ×3 IMPLANT
SUT VLOC 180 0 24IN GS25 (SUTURE) ×3 IMPLANT
TAPE CLOTH SURG 6X10 WHT LF (GAUZE/BANDAGES/DRESSINGS) ×3 IMPLANT
TOWEL OR 17X26 10 PK STRL BLUE (TOWEL DISPOSABLE) ×6 IMPLANT
TRAY FOLEY W/METER SILVER 14FR (SET/KITS/TRAYS/PACK) IMPLANT
WATER STERILE IRR 1500ML POUR (IV SOLUTION) ×3 IMPLANT

## 2015-02-07 NOTE — Anesthesia Preprocedure Evaluation (Addendum)
Anesthesia Evaluation  Patient identified by MRN, date of birth, ID band Patient awake    Reviewed: Allergy & Precautions, NPO status , Patient's Chart, lab work & pertinent test results, reviewed documented beta blocker date and time   Airway Mallampati: II   Neck ROM: Full    Dental  (+) Dental Advisory Given   Pulmonary  breath sounds clear to auscultation        Cardiovascular hypertension, Pt. on medications + Peripheral Vascular Disease + dysrhythmias Atrial Fibrillation Rhythm:Irregular  ECHO 08/2014 EF 65%, AS mod gradiant 18   Neuro/Psych Carotids 1-39% stenosis bilat TIACVA    GI/Hepatic   Endo/Other  diabetesHypothyroidism   Renal/GU      Musculoskeletal   Abdominal (+)  Abdomen: soft.    Peds  Hematology 9/29   Anesthesia Other Findings   Reproductive/Obstetrics                         Anesthesia Physical Anesthesia Plan  ASA: III  Anesthesia Plan: General   Post-op Pain Management:    Induction: Intravenous  Airway Management Planned: Oral ETT  Additional Equipment:   Intra-op Plan:   Post-operative Plan:   Informed Consent: I have reviewed the patients History and Physical, chart, labs and discussed the procedure including the risks, benefits and alternatives for the proposed anesthesia with the patient or authorized representative who has indicated his/her understanding and acceptance.     Plan Discussed with:   Anesthesia Plan Comments: (AF, anticoagulation.  Long talk with patient and son about spinal.  Pt. Last dose of Rivaroxaban was 73 hours ago.  3 days is minimum time after last dose before spinal anesthesia in patients with normal renal function.  Risks and benefits of spinal versus GA discussed.  Decision to use GA was made.)       Anesthesia Quick Evaluation

## 2015-02-07 NOTE — Brief Op Note (Signed)
02/04/2015 - 02/07/2015  6:16 PM  PATIENT:  Makayla Thomas  81 y.o. female  PRE-OPERATIVE DIAGNOSIS:  right periprosthetic fracture with femoral component failure  POST-OPERATIVE DIAGNOSIS:  right periprosthetic fracture with femoral component failure  PROCEDURE:  Procedure(s): OPEN REDUCTION INTERNAL FIXATION (ORIF) PERIPROSTHETIC FRACTURE (Right)  Revision right total hip replacment  SURGEON:  Surgeon(s) and Role:    * Durene Romans, MD - Primary  PHYSICIAN ASSISTANT: Lanney Gins, PA-C  ANESTHESIA:   general  EBL:  Total I/O In: 0  Out: 500 [Urine:500]  BLOOD ADMINISTERED:none  DRAINS: none   LOCAL MEDICATIONS USED:  NONE  SPECIMEN:  No Specimen  DISPOSITION OF SPECIMEN:  N/A  COUNTS:  YES  TOURNIQUET:  * No tourniquets in log *  DICTATION: .Other Dictation: Dictation Number [id number]  PLAN OF CARE: Admit to inpatient   PATIENT DISPOSITION:  PACU - hemodynamically stable.   Delay start of Pharmacological VTE agent (>24hrs) due to surgical blood loss or risk of bleeding: no

## 2015-02-07 NOTE — Anesthesia Procedure Notes (Signed)
Procedure Name: Intubation Date/Time: 02/07/2015 6:17 PM Performed by: Leroy Libman L Patient Re-evaluated:Patient Re-evaluated prior to inductionOxygen Delivery Method: Circle system utilized Preoxygenation: Pre-oxygenation with 100% oxygen Intubation Type: IV induction Ventilation: Mask ventilation without difficulty Laryngoscope Size: Miller and 2 Grade View: Grade I Tube type: Oral Tube size: 7.5 mm Number of attempts: 1 Airway Equipment and Method: Stylet Placement Confirmation: ETT inserted through vocal cords under direct vision,  breath sounds checked- equal and bilateral and positive ETCO2 Secured at: 21 cm Tube secured with: Tape Dental Injury: Teeth and Oropharynx as per pre-operative assessment

## 2015-02-07 NOTE — Op Note (Signed)
Makayla Thomas, Makayla Thomas NO.:  [id number]  MEDICAL RECORD NO.:  [id number]  LOCATION:  1605                         FACILITY:  [facility]  PHYSICIAN:  Makayla Frankel. Charlann Thomas, M.D.  DATE OF BIRTH:  1926/04/12  DATE OF PROCEDURE:  02/07/2015 DATE OF DISCHARGE:                              OPERATIVE REPORT   PREOPERATIVE DIAGNOSIS:  Right periprosthetic proximal femur fracture with subsidence of femoral stem.  POSTOPERATIVE DIAGNOSIS:  Right periprosthetic proximal femur fracture with subsidence of femoral stem.  PROCEDURE:  Open reduction and internal fixation of right periprosthetic proximal femur fracture in addition to a revision of the right total hip arthroplasty.  COMPONENTS USED: 1. 4 Synthes cobalt chromium cables. 2. A 16.5 mm 8 inch solution stem small stature with a 32+ 1 ball.  SURGEON:  Makayla Frankel. Charlann Thomas, M.D.  ASSISTANT:  Lanney Gins, PA-C.  Mr. Makayla Thomas was present for the entirety of the case from preoperative position, perioperative management of the operative extremity, general facilitation case and primary wound closure.  ANESTHESIA:  General.  SPECIMENS:  None.  COMPLICATION:  None apparent.  BLOOD LOSS:  500 mL.  DRAINS:  None.  INDICATIONS FOR PROCEDURE:  Makayla Thomas is an 81 year old female, who was about a week on from a total hip arthroplasty.  She presented to the emergency room with pain in her right hip.  Radiographs revealed a periprosthetic femur fracture.  The mechanism was unknown.  Her postoperative film indicated that her hip was stable.  There was no evidence of complication and she progressed throughout hospital stay fine.  She was admitted to the hospital after the discovery of fracture and stopped her Xarelto and surgery planned at an appropriate time.  Risks, benefits, and necessity of the procedure were discussed with family and patient.  Consent was obtained for benefit of management of the fracture as well as  stabilization of the hip.  PROCEDURE IN DETAIL:  The patient was brought to the operative theater. Once adequate anesthesia, preoperative antibiotics, Ancef administered, the patient was positioned in the left lateral decubitus position with the right side up.  The right lower extremity was then prepped and draped in a sterile fashion.  Time-out was performed identifying the patient, planned procedure and extremity.  I had demarcated an incision for posterior approach to the hip with previous anterior hip surgery incision healed anterior to this.  Soft tissue dissection was carried down to the vastus lateralis and gluteal fascia which was incised for posterior approach as well as distal enough to expose the fracture site based on the radiographs.  The iliotibial band was split in the vastus lateralis was then dissected off the posterior aspect of the intermuscular septum and elevated anteriorly, a fracture line was readily visible and noted instability, a large greater trochanter fracture all the way down to the lateral aspect of the hip as well as a fracture of the medial calcar.  Once the fracture fragments were identified, the 2 main segments are the greater trochanter and the distal segment were anatomically reduced to the direct palpation.  Once they were reduced and maintained in reduction, I then used 2 Synthes cables and reapproximated this large segment.  With this anatomically reduced now I was able to focus on preparation of the proximal femur.  Given the nature of the fracture pattern, we had elected to bypass this with a fully porous-coated stem. At this point, I selected an 8 inch stem to bypass the fracture site adequately.  I began reaming with a 10 mm reamer all the way up to a 16.0 mm reamer where I got a good bony purchase within the isthmus of the femur.  Based on the limited bone stock quality based on the significant medial calcar fracture in addition to what  was already present on the lateral aspect of the hip and I opened up the 16.5 small stature 8 inch solution stem, it was then impacted using a reamer handle to guide anteversion. We then impacted the tip of the trunnion at the basic level of the tip of the trochanter based on her anatomy radiographically.  We had felt that we had good purchase.  At this point, I was able to mobilize the medial calcar enough to get into its anatomic placement and placed a single cable initially.  At this point, I brought in a portable x-ray and identified that the stem had bypassed the fracture site, adequately cables appeared at the appropriate location.  At this point, the cables had been fully tied and tensioned and crimped and cut.  I did add a 4th screw at the very proximal aspect just proximal to the lesser trochanter, total of 4 cables.  After several trial reductions, I selected a 32, 1 ball.  I anatomically identified the calcar portion of her new stem was sat just at the level of the neck cut with an anatomically reduced calcar.  At this point, I used her previous 32+ 1 head ball and now that we had taken out just a little bit ago and then impacted on a clean and dry trunnion.  The hip was reduced.  Hip had been irrigated throughout the case and again at this point.  We reapproximated the iliotibial band over top of the vastus lateralis, reducing the muscle belly over the lateral aspect of the hip.  This was done with a combination of #1 Vicryl and then a 0 V-Loc suture all the way through the gluteal fascia.  The remaining wound was closed with 2-0 Vicryl and 2-0 Vicryl and staples on the skin.  The skin was cleaned, dried and dressed sterilely using Xeroform gauze and tape.  Postoperatively, she was brought to the recovery room, extubated in stable condition, tolerated the procedure well.  I am going to have her be partial weightbearing with posterior hip precautions as we go through this  procedure.  Findings reviewed with family.     Makayla Thomas, M.D.     MDO/MEDQ  D:  02/07/2015  T:  02/07/2015  Job:  [id number]

## 2015-02-07 NOTE — Anesthesia Postprocedure Evaluation (Signed)
  Anesthesia Post-op Note  Patient: Makayla Thomas  Procedure(s) Performed: Procedure(s): OPEN REDUCTION INTERNAL FIXATION (ORIF) PERIPROSTHETIC FRACTURE WITH TOTAL HIP REVISION AND CABLES (Right) Patient is awake and responsive. Pain and nausea are reasonably well controlled. Vital signs are stable and clinically acceptable. She received 1 unit PRBC's.  Oxygen saturation is clinically acceptable. There are no apparent anesthetic complications at this time. Patient is ready for discharge.

## 2015-02-07 NOTE — Progress Notes (Signed)
PROGRESS NOTE    Makayla Thomas ZOX:[id number] DOB: 31-Aug-1925 DOA: 02/04/2015 PCP: Londell Moh, MD  Primary Cardiologist: Dr. Bryan Lemma  HPI/Brief narrative 81 y.o. female with history of right THA a week ago, chronic A. fib on Xarelto, stroke, HTN, hypothyroid, HLD presented from SNF with 3 days history of increasing right groin and thigh pain. Patient states that while being moved from bed to bedside commode or wheelchair, she was dropped twice. X-rays revealed right periprosthetic hip fracture. Last dose of Xarelto on 6/20. Orthopedics consulted. Surgery was initially planned for 6/22 but postponed to 6/20 3 PM due to scheduling problems.  Assessment/Plan:  Periprosthetic right proximal femur fracture - s/p right THA on 6/14 and tolerated procedure well. - Currently presented with periprosthetic right femoral fracture-? Related to trauma during movements - Orthopedics consultation appreciated and plan surgery 6/23. Discussed with Dr. Charlann Boxer 6/21. - Xarelto last dose was on 02/04/15 at 4 PM - As per orthopedics note, surgery was initially planned for 6/22 but couldn't be done due to scheduling difficulties. Surgery planned for 6/23 PM. Advised orthopedics team that DVT prophylaxis was considered but not started each day due to impending surgery and needs to be started ASAP.  Chronic A. Fib - Patient on Cardizem and bisoprolol at home. Currently held secondary to nothing by mouth and placed on scheduled IV metoprolol - Controlled ventricular rate - Resume oral medications postoperatively - Anticoagulation held for surgery and to be resumed postoperatively  Essential hypertension - Controlled - Continue IV metoprolol scheduled and PRN IV hydralazine  Hypothyroid - On IV Synthroid  Anemia - Gradual drop of hemoglobin from 11.4-9.7. Possibly related to ABLA from femur fracture. - Follow CBCs postop and transfuse if hemoglobin less than 7 g per DL.   History of stroke -  Start anticoagulation postop  Proteus mirabilis UTI - Continue IV Rocephin    Code Status: Full Family Communication: Discussed with patient's son and daughter-in-law at bedside on 6/22 Disposition Plan: DC to SNF when medically stable   Consultants:  Orthopedics  Procedures:  Foley catheter  Antibiotics:  None   Subjective: Right hip pain with movement. Controlled with pain medications. As per nursing, confusion at times related to pain medications.   Objective: Filed Vitals:   02/06/15 1743 02/06/15 2100 02/07/15 0025 02/07/15 0506  BP: 101/46 106/50 105/49 100/56  Pulse: 107 86 74 69  Temp:  98.9 F (37.2 C)  98.3 F (36.8 C)  TempSrc:  Oral  Oral  Resp: Height:      Weight:      SpO2: 95% 96%  94%    Intake/Output Summary (Last 24 hours) at 02/07/15 1203 Last data filed at 02/07/15 1033  Gross per 24 hour  Intake 1029.17 ml  Output   1200 ml  Net -170.83 ml   Filed Weights   02/05/15 0740  Weight: 83.8 kg (184 lb 11.9 oz)     Exam:  General exam: Pleasant elderly female lying comfortably supine in bed Respiratory system: Clear. No increased work of breathing. Cardiovascular system: S1 & S2 heard, irregularly irregular. No JVD, murmurs, gallops, clicks or pedal edema. Gastrointestinal system: Abdomen is nondistended, soft and nontender. Normal bowel sounds heard. Foley+ Central nervous system: Alert and oriented. No focal neurological deficits. Extremities: Symmetric 5 x 5 power except right lower extremity which was not assessed secondary to pain. RLE shortened and internally rotated.   Data Reviewed: Basic Metabolic Panel:  Recent Labs Lab 02/05/15  1610 02/05/15 0623 02/07/15 0455  NA 139 137 138  K 4.1 4.6 4.6  CL 100* 99* 101  CO2 GLUCOSE 140* 138* 111*  BUN CREATININE 0.73 0.77 0.66  CALCIUM 8.4* 8.4* 8.1*   Liver Function Tests:  Recent Labs Lab 02/05/15 0623  AST 20  ALT 17  ALKPHOS 94    BILITOT 0.7  PROT 6.3*  ALBUMIN 3.1*   No results for input(s): LIPASE, AMYLASE in the last 168 hours. No results for input(s): AMMONIA in the last 168 hours. CBC:  Recent Labs Lab 02/05/15 0048 02/05/15 0623 02/07/15 0455  WBC 12.5* 11.5* 9.0  NEUTROABS 9.8*  --   --   HGB 11.4* 10.6* 9.7*  HCT 34.3* 33.0* 29.3*  MCV 90.3 91.4 92.7  PLT 321 301 302   Cardiac Enzymes: No results for input(s): CKTOTAL, CKMB, CKMBINDEX, TROPONINI in the last 168 hours. BNP (last 3 results) No results for input(s): PROBNP in the last 8760 hours. CBG:  Recent Labs Lab 02/06/15 1226 02/06/15 1803 02/07/15 0030 02/07/15 0519 02/07/15 0838  GLUCAP 131* 149* 139* 106* 103*    Recent Results (from the past 240 hour(s))  Culture, Urine     Status: None   Collection Time: 02/05/15  3:45 AM  Result Value Ref Range Status   Specimen Description URINE, CATHETERIZED  Final   Special Requests NONE  Final   Culture   Final    >=100,000 COLONIES/mL PROTEUS MIRABILIS Performed at [facility]    Report Status 02/07/2015 FINAL  Final   Organism ID, Bacteria PROTEUS MIRABILIS  Final      Susceptibility   Proteus mirabilis - MIC*    AMPICILLIN <=2 SENSITIVE Sensitive     CEFAZOLIN 8 SENSITIVE Sensitive     CEFTRIAXONE <=1 SENSITIVE Sensitive     CIPROFLOXACIN 2 INTERMEDIATE Intermediate     GENTAMICIN <=1 SENSITIVE Sensitive     IMIPENEM 8 INTERMEDIATE Intermediate     NITROFURANTOIN 128 RESISTANT Resistant     TRIMETH/SULFA <=20 SENSITIVE Sensitive     AMPICILLIN/SULBACTAM <=2 SENSITIVE Sensitive     PIP/TAZO <=4 SENSITIVE Sensitive     * >=100,000 COLONIES/mL PROTEUS MIRABILIS  Surgical pcr screen     Status: Abnormal   Collection Time: 02/06/15 12:17 AM  Result Value Ref Range Status   MRSA, PCR NEGATIVE NEGATIVE Final   Staphylococcus aureus POSITIVE (A) NEGATIVE Final    Comment:        The Xpert SA Assay (FDA approved for NASAL specimens in patients over 21 years of  age), is one component of a comprehensive surveillance program.  Test performance has been validated by [facility] for patients greater than or equal to 23 year old. It is not intended to diagnose infection nor to guide or monitor treatment.           Studies: No results found.      Scheduled Meds: . antiseptic oral rinse  7 mL Mouth Rinse q12n4p  . cefTRIAXone (ROCEPHIN)  IV  1 g Intravenous Q24H  . chlorhexidine   Topical Once  . chlorhexidine  15 mL Mouth Rinse BID  . Chlorhexidine Gluconate Cloth  6 each Topical Daily  . feeding supplement (ENSURE ENLIVE)  237 mL Oral BID BM  . levothyroxine  62.5 mcg Intravenous Daily  . metoprolol  5 mg Intravenous 4 times per day  . mupirocin ointment  1 application Nasal BID   Continuous Infusions: .  sodium chloride 50 mL/hr at 02/07/15 0036    Principal Problem:   Periprosthetic fracture around internal prosthetic right hip joint Active Problems:   Chronic anticoagulation   Essential hypertension -well controlled   Chronic a-fib   Hypothyroidism    Time spent: 20 minutes    Nikita Surman, MD, FACP, FHM. [facility] Pager [phone]  If 7PM-7AM, please contact night-coverage [email or web address] Password [email or web address] 02/07/2015, 12:03 PM    LOS: 2 days

## 2015-02-07 NOTE — Progress Notes (Signed)
     Subjective: Day of Surgery Procedure(s) (LRB): OPEN REDUCTION INTERNAL FIXATION (ORIF) PERIPROSTHETIC FRACTURE (Right)   Patient reports pain as mild, while at rest in bed.  Plan is for surgery later today, patient understands this and ready to proceed.  Objective:   VITALS:   Filed Vitals:   02/07/15 0506  BP: 100/56  Pulse: 69  Temp: 98.3 F (36.8 C)  Resp: 18    Dorsiflexion/Plantar flexion intact No cellulitis present Compartment soft  LABS  Recent Labs  02/05/15 0048 02/05/15 0623 02/07/15 0455  HGB 11.4* 10.6* 9.7*  HCT 34.3* 33.0* 29.3*  WBC 12.5* 11.5* 9.0  PLT 321 301 302     Recent Labs  02/05/15 0048 02/05/15 0623 02/07/15 0455  NA 139 137 138  K 4.1 4.6 4.6  BUN 18 17 18  CREATININE 0.73 0.77 0.66  GLUCOSE 140* 138* 111*     Assessment/Plan: Day of Surgery Procedure(s) (LRB): OPEN REDUCTION INTERNAL FIXATION (ORIF) PERIPROSTHETIC FRACTURE (Right)  NPO now Plan for ORIF of the right hip later today     Brenna Friesenhahn S. Adellyn Capek   PAC  02/07/2015, 12:45 PM   

## 2015-02-07 NOTE — Transfer of Care (Signed)
Immediate Anesthesia Transfer of Care Note  Patient: Makayla Thomas  Procedure(s) Performed: Procedure(s): OPEN REDUCTION INTERNAL FIXATION (ORIF) PERIPROSTHETIC FRACTURE WITH TOTAL HIP REVISION AND CABLES (Right)  Patient Location: PACU  Anesthesia Type:General  Level of Consciousness:  sedated, patient cooperative and responds to stimulation  Airway & Oxygen Therapy:Patient Spontanous Breathing and Patient connected to face mask oxgen  Post-op Assessment:  Report given to PACU RN and Post -op Vital signs reviewed and stable  Post vital signs:  Reviewed and stable  Last Vitals:  Filed Vitals:   02/07/15 2030  BP: 98/52  Pulse: 111  Temp:   Resp: 7    Complications: No apparent anesthesia complications

## 2015-02-07 NOTE — H&P (View-Only) (Signed)
     Subjective: Day of Surgery Procedure(s) (LRB): OPEN REDUCTION INTERNAL FIXATION (ORIF) PERIPROSTHETIC FRACTURE (Right)   Patient reports pain as mild, while at rest in bed.  Plan is for surgery later today, patient understands this and ready to proceed.  Objective:   VITALS:   Filed Vitals:   02/07/15 0506  BP: 100/56  Pulse: 69  Temp: 98.3 F (36.8 C)  Resp: 18    Dorsiflexion/Plantar flexion intact No cellulitis present Compartment soft  LABS  Recent Labs  02/05/15 0048 02/05/15 0623 02/07/15 0455  HGB 11.4* 10.6* 9.7*  HCT 34.3* 33.0* 29.3*  WBC 12.5* 11.5* 9.0  PLT 321 301 302     Recent Labs  02/05/15 0048 02/05/15 0623 02/07/15 0455  NA 139 137 138  K 4.1 4.6 4.6  BUN 18 17 18   CREATININE 0.73 0.77 0.66  GLUCOSE 140* 138* 111*     Assessment/Plan: Day of Surgery Procedure(s) (LRB): OPEN REDUCTION INTERNAL FIXATION (ORIF) PERIPROSTHETIC FRACTURE (Right)  NPO now Plan for ORIF of the right hip later today     Anastasio Auerbach. Jakaiden Fill   PAC  02/07/2015, 12:45 PM

## 2015-02-07 NOTE — Interval H&P Note (Signed)
History and Physical Interval Note:  02/07/2015 5:51 PM  Makayla Thomas  has presented today for surgery, with the diagnosis of right periprosthetic fracture  The various methods of treatment have been discussed with the patient and family. After consideration of risks, benefits and other options for treatment, the patient has consented to  Procedure(s): OPEN REDUCTION INTERNAL FIXATION (ORIF) PERIPROSTHETIC FRACTURE (Right) as a surgical intervention .  The patient's history has been reviewed, patient examined, no change in status, stable for surgery.  I have reviewed the patient's chart and labs.  Questions were answered to the patient's satisfaction.     [name]

## 2015-02-08 ENCOUNTER — Inpatient Hospital Stay (HOSPITAL_COMMUNITY): Payer: Medicare Other

## 2015-02-08 DIAGNOSIS — R509 Fever, unspecified: Secondary | ICD-10-CM

## 2015-02-08 DIAGNOSIS — B964 Proteus (mirabilis) (morganii) as the cause of diseases classified elsewhere: Secondary | ICD-10-CM

## 2015-02-08 DIAGNOSIS — N390 Urinary tract infection, site not specified: Secondary | ICD-10-CM

## 2015-02-08 LAB — CBC
HCT: 31 % — ABNORMAL LOW (ref 36.0–46.0)
Hemoglobin: 9.8 g/dL — ABNORMAL LOW (ref 12.0–15.0)
MCH: 28.7 pg (ref 26.0–34.0)
MCHC: 31.6 g/dL (ref 30.0–36.0)
MCV: 90.6 fL (ref 78.0–100.0)
PLATELETS: 243 10*3/uL (ref 150–400)
RBC: 3.42 MIL/uL — AB (ref 3.87–5.11)
RDW: 15 % (ref 11.5–15.5)
WBC: 13.6 10*3/uL — AB (ref 4.0–10.5)

## 2015-02-08 LAB — GLUCOSE, CAPILLARY
Glucose-Capillary: 119 mg/dL — ABNORMAL HIGH (ref 65–99)
Glucose-Capillary: 166 mg/dL — ABNORMAL HIGH (ref 65–99)

## 2015-02-08 MED ORDER — POLYETHYLENE GLYCOL 3350 17 G PO PACK
17.0000 g | PACK | Freq: Two times a day (BID) | ORAL | Status: DC
  Administered 2015-02-09 – 2015-02-10 (×3): 17 g via ORAL
  Filled 2015-02-08 (×6): qty 1

## 2015-02-08 MED ORDER — DILTIAZEM HCL ER COATED BEADS 120 MG PO CP24
120.0000 mg | ORAL_CAPSULE | Freq: Every day | ORAL | Status: DC
  Administered 2015-02-08 – 2015-02-12 (×4): 120 mg via ORAL
  Filled 2015-02-08 (×5): qty 1

## 2015-02-08 MED ORDER — LEVOTHYROXINE SODIUM 125 MCG PO TABS
125.0000 ug | ORAL_TABLET | Freq: Every day | ORAL | Status: DC
  Administered 2015-02-08 – 2015-02-12 (×5): 125 ug via ORAL
  Filled 2015-02-08 (×6): qty 1

## 2015-02-08 MED ORDER — FUROSEMIDE 20 MG PO TABS
20.0000 mg | ORAL_TABLET | Freq: Every morning | ORAL | Status: DC
  Administered 2015-02-09 – 2015-02-12 (×3): 20 mg via ORAL
  Filled 2015-02-08 (×4): qty 1

## 2015-02-08 MED ORDER — DOCUSATE SODIUM 100 MG PO CAPS: 100.0000 mg | ORAL_CAPSULE | Freq: Two times a day (BID) | ORAL | Status: DC

## 2015-02-08 MED ORDER — TRIAMTERENE-HCTZ 37.5-25 MG PO TABS
1.0000 | ORAL_TABLET | Freq: Every morning | ORAL | Status: DC
  Administered 2015-02-09 – 2015-02-12 (×3): 1 via ORAL
  Filled 2015-02-08 (×4): qty 1

## 2015-02-08 MED ORDER — PRAVASTATIN SODIUM 40 MG PO TABS
40.0000 mg | ORAL_TABLET | Freq: Every day | ORAL | Status: DC
  Administered 2015-02-09 – 2015-02-12 (×4): 40 mg via ORAL
  Filled 2015-02-08 (×5): qty 1

## 2015-02-08 MED ORDER — VITAMIN D3 25 MCG (1000 UNIT) PO TABS
1000.0000 [IU] | ORAL_TABLET | Freq: Every evening | ORAL | Status: DC
  Administered 2015-02-09 – 2015-02-11 (×3): 1000 [IU] via ORAL
  Filled 2015-02-08 (×5): qty 1

## 2015-02-08 MED ORDER — BISOPROLOL FUMARATE 10 MG PO TABS
10.0000 mg | ORAL_TABLET | Freq: Every day | ORAL | Status: DC
  Administered 2015-02-09: 10 mg via ORAL
  Filled 2015-02-08 (×3): qty 1

## 2015-02-08 MED ORDER — TIZANIDINE HCL 4 MG PO TABS
4.0000 mg | ORAL_TABLET | Freq: Four times a day (QID) | ORAL | Status: DC | PRN
  Administered 2015-02-09 – 2015-02-10 (×2): 4 mg via ORAL
  Filled 2015-02-08 (×4): qty 1

## 2015-02-08 NOTE — Progress Notes (Signed)
CSW assisting with d/c planning. Pt has accepted ST Rehab bed at [facility] when stable for d/c. CSW will continue to follow to assist with d/c planning to SNF.  Cori Razor LCSW [phone]

## 2015-02-08 NOTE — Progress Notes (Signed)
Patient ID: Makayla Thomas, female   DOB: Sep 05, 1925, 81 y.o.   MRN: [id number] Subjective: 1 Day Post-Op Procedure(s) (LRB): OPEN REDUCTION INTERNAL FIXATION (ORIF) PERIPROSTHETIC FRACTURE WITH TOTAL HIP REVISION AND CABLES (Right)    Patient reports pain as moderate.  No events overnight  Objective:   VITALS:   Filed Vitals:   02/08/15 0606  BP:   Pulse: 88  Temp:   Resp:     Neurovascular intact Incision: dressing C/D/I  LABS  Recent Labs  02/07/15 0455 02/08/15 0417  HGB 9.7* 9.8*  HCT 29.3* 31.0*  WBC 9.0 13.6*  PLT 302 243     Recent Labs  02/07/15 0455  NA 138  K 4.6  BUN 18  CREATININE 0.66  GLUCOSE 111*    No results for input(s): LABPT, INR in the last 72 hours.   Assessment/Plan: 1 Day Post-Op Procedure(s) (LRB): OPEN REDUCTION INTERNAL FIXATION (ORIF) PERIPROSTHETIC FRACTURE WITH TOTAL HIP REVISION AND CABLES (Right)   Advance diet Up with therapy Discharge to SNF when stable probably Monday  50% weight bearing right lower extremity Limit pivoting activity right LE

## 2015-02-08 NOTE — Progress Notes (Signed)
OT Cancellation Note  Patient Details Name: Makayla Thomas MRN: [id number] DOB: 11/17/1925   Cancelled Treatment:    Reason Eval/Treat Not Completed: Other (comment).  Pt is not ready for OT today.  Will check back.  Jovita Persing 02/08/2015, 12:19 PM  [name], OTR/L [phone] 02/08/2015

## 2015-02-08 NOTE — Evaluation (Signed)
Physical Therapy Evaluation Patient Details Name: Abigial Manne MRN: [id number] DOB: 02-12-1926 Today's Date: 02/08/2015   History of Present Illness  81 yo female with recent hx of R THA-direct anterior 01/29/15 and admitted from SNF with persistant R hip pain and found to have right periprosthetic fracture with femoral component failure, now s/p ORIF and THA revision  Clinical Impression  Patient is s/p above surgery resulting in functional limitations due to the deficits listed below (see PT Problem List).  Patient will benefit from skilled PT to increase their independence and safety with mobility to allow discharge to the venue listed below.   Pt very groggy and occasionally nonsensical during session so limited evaluation to sitting EOB only.      Follow Up Recommendations SNF;Supervision/Assistance - 24 hour    Equipment Recommendations  Wheelchair (measurements PT)    Recommendations for Other Services       Precautions / Restrictions Precautions Precautions: Fall;Posterior Hip Restrictions Weight Bearing Restrictions: Yes RLE Weight Bearing: Partial weight bearing RLE Partial Weight Bearing Percentage or Pounds: 50% Other Position/Activity Restrictions: limited to transfers      Mobility  Bed Mobility Overal bed mobility: Needs Assistance Bed Mobility: Supine to Sit;Sit to Supine     Supine to sit: Max assist;HOB elevated Sit to supine: Max assist   General bed mobility comments: verbal and tactile cues for technique and maintaining precautions, assist for trunk upright, R LE and scooting hips (utilized bed pad), only able to tolerate sitting EOB a few minutes  Transfers                    Ambulation/Gait                Stairs            Wheelchair Mobility    Modified Rankin (Stroke Patients Only)       Balance Overall balance assessment: Needs assistance Sitting-balance support: Bilateral upper extremity supported;Feet  supported Sitting balance-Leahy Scale: Poor                                       Pertinent Vitals/Pain Pain Assessment: Faces Faces Pain Scale: Hurts little more Pain Location: R hip Pain Descriptors / Indicators: Aching;Grimacing;Guarding Pain Intervention(s): Limited activity within patient's tolerance;Monitored during session    Home Living Family/patient expects to be discharged to:: Skilled nursing facility                      Prior Function Level of Independence: Independent with assistive device(s)         Comments: was MOD I prior to recent THA however d/c to SNF thereafter and will need SNF upon this admission discharge     Hand Dominance        Extremity/Trunk Assessment               Lower Extremity Assessment: RLE deficits/detail RLE Deficits / Details: pt requiring assist for movement of R LE due to pain       Communication   Communication: No difficulties  Cognition Arousal/Alertness: Lethargic;Suspect due to medications   Overall Cognitive Status: No family/caregiver present to determine baseline cognitive functioning Area of Impairment: Following commands;Safety/judgement;Problem solving     Memory: Decreased recall of precautions Following Commands: Follows one step commands inconsistently;Follows one step commands with increased time Safety/Judgement: Decreased awareness of safety   Problem  Solving: Requires verbal cues;Slow processing;Difficulty sequencing      General Comments      Exercises        Assessment/Plan    PT Assessment Patient needs continued PT services  PT Diagnosis Difficulty walking;Acute pain   PT Problem List Decreased strength;Decreased range of motion;Decreased activity tolerance;Decreased balance;Decreased knowledge of use of DME;Decreased mobility;Pain;Decreased knowledge of precautions  PT Treatment Interventions DME instruction;Functional mobility training;Therapeutic  activities;Therapeutic exercise;Patient/family education;Balance training;Wheelchair mobility training   PT Goals (Current goals can be found in the Care Plan section) Acute Rehab PT Goals PT Goal Formulation: With patient Time For Goal Achievement: 02/15/15 Potential to Achieve Goals: Good    Frequency Min 3X/week   Barriers to discharge        Co-evaluation               End of Session   Activity Tolerance: Patient limited by lethargy;Patient limited by pain Patient left: in bed;with call bell/phone within reach;with bed alarm set           Time: 0912-0930 PT Time Calculation (min) (ACUTE ONLY): 18 min   Charges:   PT Evaluation $Initial PT Evaluation Tier I: 1 Procedure     PT G Codes:        Ted Goodner,[name] 02/08/2015, 11:53 AM Zenovia Jarred, PT, DPT 02/08/2015 Pager: [phone]

## 2015-02-08 NOTE — Progress Notes (Signed)
[facility] PROGRESS NOTE  Makayla Thomas ZOX:[id number] DOB: Aug 07, 1926 DOA: 02/04/2015 PCP: Londell Moh, MD   brief narrative 81 year old female with history of right total hip arthroplasty 1 week back, chronic A. fib on Xarelto, history of stroke, hypertension, hypothyroidism and hyperlipidemia presented from skilled nursing facility with three-day history of increasing right groin and right thigh pain. She reports that she was drop twice when moved from her bed to bedside commode. X-ray showed right. Prosthetic hip fracture. Patient seen by orthopedics and underwent ORIF.    Assessment/Plan: Periprosthetic right proximal femur fracture Recently had right THA on 6/14. Unclear if this was related to trauma during mobilization. Patient underwent ORIF on 6/23. Tolerated well. Up with therapy. Discontinue Foley in a.m. Pain control with when necessary Vicodin.  Resume Xarelto. Discharged to skilled nursing facility likely on 6/27  Chronic A. fib Rate controlled. Resume home dose Cardizem and bisoprolol. Resumed Xarelto.  Essential hypertension Resume home medications  Hypothyroidism Continue Synthroid.  Acute blood loss anemia Continue iron supplements. Transfuse if hemoglobin <7  Proteus UTI On empiric IV Rocephin.  Fever on 6/24 MAXIMUM TEMPERATURE of 100.4. WBC elevated. Check chest x-ray.   Diet: Heart healthy DVT prophylaxis: On Xarelto  Code Status: Full code Family Communication:  None at bedside   Disposition Plan: Skilled nursing facility on 6/27   Consultants:  Dr. Charlann Boxer  Procedures:  ORIF rt hip  Antibiotics:  IV rocephin 6/23  HPI/Subjective: Patient seen and examined. Complains of some pain in her right hip. Had a fever of 100.75F this afternoon.  Objective: Filed Vitals:   02/08/15 1542  BP: 115/48  Pulse: 105  Temp: 100.2 F (37.9 C)  Resp: 18    Intake/Output Summary (Last 24 hours) at 02/08/15 1544 Last data filed at  02/08/15 1457  Gross per 24 hour  Intake   3290 ml  Output   1315 ml  Net   1975 ml   Filed Weights   02/05/15 0740  Weight: 83.8 kg (184 lb 11.9 oz)    Exam:   General:  Elderly female in no distress  HEENT: Pallor present, moist oral mucosa, supple neck next   Chest: clear b/l, no added sounds  Cardiovascular: S1&S2 irregular, no murmurs  Respiratory: Clear b/l   Abdomen: soft, NT, ND, BS+, Foley in place  Musculoskeletal: Warm dressing lower right hip   CNS: Alert and oriented  Data Reviewed: Basic Metabolic Panel:  Recent Labs Lab 02/05/15 0048 02/05/15 0623 02/07/15 0455  NA 139 137 138  K 4.1 4.6 4.6  CL 100* 99* 101  CO2 26 28 28   GLUCOSE 140* 138* 111*  BUN 18 17 18   CREATININE 0.73 0.77 0.66  CALCIUM 8.4* 8.4* 8.1*   Liver Function Tests:  Recent Labs Lab 02/05/15 0623  AST 20  ALT 17  ALKPHOS 94  BILITOT 0.7  PROT 6.3*  ALBUMIN 3.1*   No results for input(s): LIPASE, AMYLASE in the last 168 hours. No results for input(s): AMMONIA in the last 168 hours. CBC:  Recent Labs Lab 02/05/15 0048 02/05/15 0623 02/07/15 0455 02/08/15 0417  WBC 12.5* 11.5* 9.0 13.6*  NEUTROABS 9.8*  --   --   --   HGB 11.4* 10.6* 9.7* 9.8*  HCT 34.3* 33.0* 29.3* 31.0*  MCV 90.3 91.4 92.7 90.6  PLT 321 301 302 243   Cardiac Enzymes: No results for input(s): CKTOTAL, CKMB, CKMBINDEX, TROPONINI in the last 168 hours. BNP (last 3 results) No results for input(s): BNP  in the last 8760 hours.  ProBNP (last 3 results) No results for input(s): PROBNP in the last 8760 hours.  CBG:  Recent Labs Lab 02/07/15 0838 02/07/15 1210 02/07/15 2255 02/08/15 0755 02/08/15 1422  GLUCAP 103* 106* 157* 119* 166*    Recent Results (from the past 240 hour(s))  Culture, Urine     Status: None   Collection Time: 02/05/15  3:45 AM  Result Value Ref Range Status   Specimen Description URINE, CATHETERIZED  Final   Special Requests NONE  Final   Culture   Final     >=100,000 COLONIES/mL PROTEUS MIRABILIS Performed at [facility]    Report Status 02/07/2015 FINAL  Final   Organism ID, Bacteria PROTEUS MIRABILIS  Final      Susceptibility   Proteus mirabilis - MIC*    AMPICILLIN <=2 SENSITIVE Sensitive     CEFAZOLIN 8 SENSITIVE Sensitive     CEFTRIAXONE <=1 SENSITIVE Sensitive     CIPROFLOXACIN 2 INTERMEDIATE Intermediate     GENTAMICIN <=1 SENSITIVE Sensitive     IMIPENEM 8 INTERMEDIATE Intermediate     NITROFURANTOIN 128 RESISTANT Resistant     TRIMETH/SULFA <=20 SENSITIVE Sensitive     AMPICILLIN/SULBACTAM <=2 SENSITIVE Sensitive     PIP/TAZO <=4 SENSITIVE Sensitive     * >=100,000 COLONIES/mL PROTEUS MIRABILIS  Surgical pcr screen     Status: Abnormal   Collection Time: 02/06/15 12:17 AM  Result Value Ref Range Status   MRSA, PCR NEGATIVE NEGATIVE Final   Staphylococcus aureus POSITIVE (A) NEGATIVE Final    Comment:        The Xpert SA Assay (FDA approved for NASAL specimens in patients over 62 years of age), is one component of a comprehensive surveillance program.  Test performance has been validated by [facility] for patients greater than or equal to 88 year old. It is not intended to diagnose infection nor to guide or monitor treatment.      Studies: Dg Pelvis Portable  02/08/2015   CLINICAL DATA:  81 year old female status post right hip surgery. Initial encounter.  EXAM: PORTABLE PELVIS 1-2 VIEWS  COMPARISON:  02/05/2015. Right femur series from 1934 hours on 02/07/2015.  FINDINGS: 2 portable AP views of the lower pelvis and right femur. Sequelae of right total hip arthroplasty. Right femoral implant has been revised since 02/05/2015. Longer right femoral stem is in place with re- identified periprosthetic longitudinal fracture tracking into the distal right femoral shaft (image 2).  Stable visualized bony pelvis. Visible left femur remarkable for partially visible left knee arthroplasty.  IMPRESSION: Right femoral  shaft periprosthetic fracture Re-identified.   Electronically Signed   By: Odessa Fleming M.D.   On: 02/08/2015 08:58   Dg Hip Unilat With Pelvis 2-3 Views Right  02/07/2015   CLINICAL DATA:  Revision of right hip replacement.  EXAM: RIGHT HIP (WITH PELVIS) 2-3 VIEWS  COMPARISON:  Plain films the right hip 01/29/2015.  FINDINGS: Single intraoperative view of the right hip is identified. Surgical soft tissue wound is identified. The patient has a new long stem normal finding in place. Cerclage wires are identified scratch at 3 cerclage wires are present about the stem. There is a periprosthetic fracture off the tip of the stem.  IMPRESSION: Revision the femoral component of right hip replacement. Periprosthetic femur fracture is seen off the tip of the new long femoral stem.  Critical Value/emergent results were called by telephone at the time of interpretation on 02/07/2015 at 7:56 pm to  Edythe Lynn, RN., the patient's nurse who verbally acknowledged these results.   Electronically Signed   By: Drusilla Kanner M.D.   On: 02/07/2015 19:58    Scheduled Meds: . antiseptic oral rinse  7 mL Mouth Rinse q12n4p  . cefTRIAXone (ROCEPHIN)  IV  1 g Intravenous Q24H  . chlorhexidine   Topical Once  . chlorhexidine  15 mL Mouth Rinse BID  . Chlorhexidine Gluconate Cloth  6 each Topical Daily  . docusate sodium  100 mg Oral BID  . feeding supplement (ENSURE ENLIVE)  237 mL Oral BID BM  . ferrous sulfate  325 mg Oral TID PC  . levothyroxine  62.5 mcg Intravenous Daily  . metoprolol  5 mg Intravenous 4 times per day  . mupirocin ointment  1 application Nasal BID  . rivaroxaban  10 mg Oral Q breakfast   Continuous Infusions: . sodium chloride 50 mL/hr at 02/07/15 2326  . sodium chloride 0.9 % 1,000 mL with potassium chloride 10 mEq infusion 50 mL/hr at 02/08/15 1429      Time spent: 25 minutes    Makayla Thomas  [facility] Pager [phone]. If 7PM-7AM, please contact night-coverage at  [email or web address], password [facility] 02/08/2015, 3:44 PM  LOS: 3 days

## 2015-02-09 DIAGNOSIS — I482 Chronic atrial fibrillation: Secondary | ICD-10-CM

## 2015-02-09 DIAGNOSIS — T84040A Periprosthetic fracture around internal prosthetic right hip joint, initial encounter: Secondary | ICD-10-CM

## 2015-02-09 DIAGNOSIS — Z96641 Presence of right artificial hip joint: Secondary | ICD-10-CM

## 2015-02-09 DIAGNOSIS — Z7901 Long term (current) use of anticoagulants: Secondary | ICD-10-CM

## 2015-02-09 LAB — TYPE AND SCREEN
ABO/RH(D): O POS
Antibody Screen: NEGATIVE
Unit division: 0
Unit division: 0

## 2015-02-09 LAB — CBC
HCT: 27.3 % — ABNORMAL LOW (ref 36.0–46.0)
Hemoglobin: 8.7 g/dL — ABNORMAL LOW (ref 12.0–15.0)
MCH: 29.1 pg (ref 26.0–34.0)
MCHC: 31.9 g/dL (ref 30.0–36.0)
MCV: 91.3 fL (ref 78.0–100.0)
Platelets: 195 10*3/uL (ref 150–400)
RBC: 2.99 MIL/uL — AB (ref 3.87–5.11)
RDW: 15 % (ref 11.5–15.5)
WBC: 17.8 10*3/uL — ABNORMAL HIGH (ref 4.0–10.5)

## 2015-02-09 LAB — PROCALCITONIN: PROCALCITONIN: 0.32 ng/mL

## 2015-02-09 NOTE — Progress Notes (Signed)
[facility] PROGRESS NOTE  Jaeleen Inzunza ZOX:[id number] DOB: 10/27/1925 DOA: 02/04/2015 PCP: Londell Moh, MD   brief narrative 81 year old female with history of right total hip arthroplasty 1 week back, chronic A. fib on Xarelto, history of stroke, hypertension, hypothyroidism and hyperlipidemia presented from skilled nursing facility with three-day history of increasing right groin and right thigh pain. She reports that she was drop twice when moved from her bed to bedside commode. X-ray showed right. Prosthetic hip fracture. Patient seen by orthopedics and underwent ORIF.    Assessment/Plan: Periprosthetic right proximal femur fracture Recently had right THA on 6/14. Unclear if this was related to trauma during mobilization. Patient underwent ORIF on 6/23. Tolerated well. Up with therapy. Discontinued Foley in a.m. Pain control with prn Vicodin.  Resume Xarelto. Discharge to skilled nursing facility likely on 6/27  Chronic A. fib Rate controlled. Resume home dose Cardizem. Resumed Xarelto. Bisoprolol held for hypotension.   Essential hypertension Resume home medications. Well controlled.   Hypothyroidism Continue Synthroid.  Acute blood loss anemia Continue iron supplements. Transfuse if hemoglobin <7. Hemoglobin drop to 8.7. Continue to watch it.   Proteus UTI On empiric IV Rocephin.  Fever on 6/24 No fever today, CXR shows atelectatasis. Elevated wbc count probably reactive as procalcitonin is negative.   Diet: Heart healthy DVT prophylaxis: On Xarelto  Code Status: Full code Family Communication:  None at bedside   Disposition Plan: Skilled nursing facility on 6/27   Consultants:  Dr. Charlann Boxer  Procedures:  ORIF rt hip  Antibiotics:  IV rocephin 6/23  HPI/Subjective: Patient seen and examined. Pain is better at rest.   Objective: Filed Vitals:   02/09/15 0957  BP: 106/65  Pulse: 80  Temp: 98.5 F (36.9 C)  Resp: 15     Intake/Output Summary (Last 24 hours) at 02/09/15 1511 Last data filed at 02/09/15 8119  Gross per 24 hour  Intake    720 ml  Output    625 ml  Net     95 ml   Filed Weights   02/05/15 0740  Weight: 83.8 kg (184 lb 11.9 oz)    Exam:   General:  Elderly female in no distress  HEENT: Pallor present, moist oral mucosa, supple neck next   Chest: clear b/l, no added sounds  Cardiovascular: S1&S2 irregular, no murmurs  Respiratory: Clear b/l   Abdomen: soft, NT, ND, BS+, Foley in place  Musculoskeletal: Warm dressing lower right hip   CNS: Alert and oriented  Data Reviewed: Basic Metabolic Panel:  Recent Labs Lab 02/05/15 0048 02/05/15 0623 02/07/15 0455  NA 139 137 138  K 4.1 4.6 4.6  CL 100* 99* 101  CO2 GLUCOSE 140* 138* 111*  BUN CREATININE 0.73 0.77 0.66  CALCIUM 8.4* 8.4* 8.1*   Liver Function Tests:  Recent Labs Lab 02/05/15 0623  AST 20  ALT 17  ALKPHOS 94  BILITOT 0.7  PROT 6.3*  ALBUMIN 3.1*   No results for input(s): LIPASE, AMYLASE in the last 168 hours. No results for input(s): AMMONIA in the last 168 hours. CBC:  Recent Labs Lab 02/05/15 0048 02/05/15 0623 02/07/15 0455 02/08/15 0417 02/09/15 0828  WBC 12.5* 11.5* 9.0 13.6* 17.8*  NEUTROABS 9.8*  --   --   --   --   HGB 11.4* 10.6* 9.7* 9.8* 8.7*  HCT 34.3* 33.0* 29.3* 31.0* 27.3*  MCV 90.3 91.4 92.7 90.6 91.3  PLT 321 301 302 243 195  Cardiac Enzymes: No results for input(s): CKTOTAL, CKMB, CKMBINDEX, TROPONINI in the last 168 hours. BNP (last 3 results) No results for input(s): BNP in the last 8760 hours.  ProBNP (last 3 results) No results for input(s): PROBNP in the last 8760 hours.  CBG:  Recent Labs Lab 02/07/15 0838 02/07/15 1210 02/07/15 2255 02/08/15 0755 02/08/15 1422  GLUCAP 103* 106* 157* 119* 166*    Recent Results (from the past 240 hour(s))  Culture, Urine     Status: None   Collection Time: 02/05/15  3:45 AM  Result  Value Ref Range Status   Specimen Description URINE, CATHETERIZED  Final   Special Requests NONE  Final   Culture   Final    >=100,000 COLONIES/mL PROTEUS MIRABILIS Performed at [facility]    Report Status 02/07/2015 FINAL  Final   Organism ID, Bacteria PROTEUS MIRABILIS  Final      Susceptibility   Proteus mirabilis - MIC*    AMPICILLIN <=2 SENSITIVE Sensitive     CEFAZOLIN 8 SENSITIVE Sensitive     CEFTRIAXONE <=1 SENSITIVE Sensitive     CIPROFLOXACIN 2 INTERMEDIATE Intermediate     GENTAMICIN <=1 SENSITIVE Sensitive     IMIPENEM 8 INTERMEDIATE Intermediate     NITROFURANTOIN 128 RESISTANT Resistant     TRIMETH/SULFA <=20 SENSITIVE Sensitive     AMPICILLIN/SULBACTAM <=2 SENSITIVE Sensitive     PIP/TAZO <=4 SENSITIVE Sensitive     * >=100,000 COLONIES/mL PROTEUS MIRABILIS  Surgical pcr screen     Status: Abnormal   Collection Time: 02/06/15 12:17 AM  Result Value Ref Range Status   MRSA, PCR NEGATIVE NEGATIVE Final   Staphylococcus aureus POSITIVE (A) NEGATIVE Final    Comment:        The Xpert SA Assay (FDA approved for NASAL specimens in patients over 25 years of age), is one component of a comprehensive surveillance program.  Test performance has been validated by [facility] for patients greater than or equal to 31 year old. It is not intended to diagnose infection nor to guide or monitor treatment.      Studies: Dg Pelvis Portable  02/08/2015   CLINICAL DATA:  81 year old female status post right hip surgery. Initial encounter.  EXAM: PORTABLE PELVIS 1-2 VIEWS  COMPARISON:  02/05/2015. Right femur series from 1934 hours on 02/07/2015.  FINDINGS: 2 portable AP views of the lower pelvis and right femur. Sequelae of right total hip arthroplasty. Right femoral implant has been revised since 02/05/2015. Longer right femoral stem is in place with re- identified periprosthetic longitudinal fracture tracking into the distal right femoral shaft (image 2).  Stable  visualized bony pelvis. Visible left femur remarkable for partially visible left knee arthroplasty.  IMPRESSION: Right femoral shaft periprosthetic fracture Re-identified.   Electronically Signed   By: Odessa Fleming M.D.   On: 02/08/2015 08:58   Dg Chest Port 1 View  02/08/2015   CLINICAL DATA:  Productive cough and fever for days.  EXAM: PORTABLE CHEST - 1 VIEW  COMPARISON:  02/05/2015 and 08/29/2014  FINDINGS: Lungs are adequately inflated with mild left basilar opacification which may be due to atelectasis and small amount of pleural fluid versus infection. Mild stable cardiomegaly. Calcified plaque over the aortic arch. There are degenerative changes of the spine.  IMPRESSION: Mild left basilar opacification which may be due to atelectasis and small effusion, although cannot exclude infection in the left base.  Stable mild cardiomegaly.   Electronically Signed   By: Elberta Fortis  M.D.   On: 02/08/2015 16:26   Dg Hip Unilat With Pelvis 2-3 Views Right  02/07/2015   CLINICAL DATA:  Revision of right hip replacement.  EXAM: RIGHT HIP (WITH PELVIS) 2-3 VIEWS  COMPARISON:  Plain films the right hip 01/29/2015.  FINDINGS: Single intraoperative view of the right hip is identified. Surgical soft tissue wound is identified. The patient has a new long stem normal finding in place. Cerclage wires are identified scratch at 3 cerclage wires are present about the stem. There is a periprosthetic fracture off the tip of the stem.  IMPRESSION: Revision the femoral component of right hip replacement. Periprosthetic femur fracture is seen off the tip of the new long femoral stem.  Critical Value/emergent results were called by telephone at the time of interpretation on 02/07/2015 at 7:56 pm to Edythe Lynn, RN., the patient's nurse who verbally acknowledged these results.   Electronically Signed   By: Drusilla Kanner M.D.   On: 02/07/2015 19:58    Scheduled Meds: . antiseptic oral rinse  7 mL Mouth Rinse q12n4p  . bisoprolol   10 mg Oral Daily  . cefTRIAXone (ROCEPHIN)  IV  1 g Intravenous Q24H  . chlorhexidine   Topical Once  . chlorhexidine  15 mL Mouth Rinse BID  . Chlorhexidine Gluconate Cloth  6 each Topical Daily  . cholecalciferol  1,000 Units Oral QPM  . diltiazem  120 mg Oral Daily  . docusate sodium  100 mg Oral BID  . feeding supplement (ENSURE ENLIVE)  237 mL Oral BID BM  . ferrous sulfate  325 mg Oral TID PC  . furosemide  20 mg Oral q morning - 10a  . levothyroxine  125 mcg Oral QAC breakfast  . mupirocin ointment  1 application Nasal BID  . polyethylene glycol  17 g Oral BID  . pravastatin  40 mg Oral Daily  . rivaroxaban  10 mg Oral Q breakfast  . triamterene-hydrochlorothiazide  1 tablet Oral q morning - 10a   Continuous Infusions: . sodium chloride 0.9 % 1,000 mL with potassium chloride 10 mEq infusion 50 mL/hr at 02/09/15 1201      Time spent: 25 minutes    [facility]  [facility] Pager [phone]. If 7PM-7AM, please contact night-coverage at [email or web address], password [facility] 02/09/2015, 3:11 PM  LOS: 4 days

## 2015-02-09 NOTE — Progress Notes (Signed)
02/08/15 2100 Nursing Erin in pharmacy called reg patient's low blood pressure and rapid pulse. Pharmacy suggested holding Zebeta now and restarting patient's cardiazem.  Will do as she suggests and continue to monitor patient.

## 2015-02-09 NOTE — Progress Notes (Signed)
OT  Note  Patient Details Name: Makayla Thomas MRN: [id number] DOB: Dec 25, 1925   Cancelled Treatment:    Reason Eval/Treat Not Completed: Other (comment)  Noted pt from SNF going back to SNF. Will defer OT eval to SNF  Connar Keating, [place] [facility] 02/09/2015, 9:13 AM

## 2015-02-09 NOTE — Progress Notes (Signed)
    Subjective: 2 Days Post-Op Procedure(s) (LRB): OPEN REDUCTION INTERNAL FIXATION (ORIF) PERIPROSTHETIC FRACTURE WITH TOTAL HIP REVISION AND CABLES (Right) Patient reports pain as 4 on 0-10 scale.   Denies CP or SOB.  Voiding without difficulty. Positive flatus. Objective: Vital signs in last 24 hours: Temp:  [99.2 F (37.3 C)-100.4 F (38 C)] 99.2 F (37.3 C) (06/25 0600) Pulse Rate:  [95-145] 95 (06/25 0639) Resp:  [16-18] 16 (06/25 0600) BP: (102-120)/(48-65) 120/65 mmHg (06/25 0600) SpO2:  [85 %-100 %] 100 % (06/25 0600)  Intake/Output from previous day: 06/24 0701 - 06/25 0700 In: 1140 [P.O.:540; I.V.:600] Out: 800 [Urine:800] Intake/Output this shift:    Labs:  Recent Labs  02/07/15 0455 02/08/15 0417  HGB 9.7* 9.8*    Recent Labs  02/07/15 0455 02/08/15 0417  WBC 9.0 13.6*  RBC 3.16* 3.42*  HCT 29.3* 31.0*  PLT 302 243    Recent Labs  02/07/15 0455  NA 138  K 4.6  CL 101  CO2 28  BUN 18  CREATININE 0.66  GLUCOSE 111*  CALCIUM 8.1*   No results for input(s): LABPT, INR in the last 72 hours.  Physical Exam: ABD soft Neurovascular intact Incision: dressing C/D/I Compartment soft  Assessment/Plan: 2 Days Post-Op Procedure(s) (LRB): OPEN REDUCTION INTERNAL FIXATION (ORIF) PERIPROSTHETIC FRACTURE WITH TOTAL HIP REVISION AND CABLES (Right) Advance diet Up with therapy  Continue care Will order CBC - eval for post op anemia.    Venita Lick D for Dr. Venita Lick [facility] [phone] 02/09/2015, 7:37 AM

## 2015-02-10 LAB — CBC WITH DIFFERENTIAL/PLATELET
Basophils Absolute: 0 10*3/uL (ref 0.0–0.1)
Basophils Relative: 0 % (ref 0–1)
EOS PCT: 4 % (ref 0–5)
Eosinophils Absolute: 0.4 10*3/uL (ref 0.0–0.7)
HEMATOCRIT: 24.7 % — AB (ref 36.0–46.0)
Hemoglobin: 7.9 g/dL — ABNORMAL LOW (ref 12.0–15.0)
LYMPHS ABS: 1.4 10*3/uL (ref 0.7–4.0)
Lymphocytes Relative: 12 % (ref 12–46)
MCH: 29.7 pg (ref 26.0–34.0)
MCHC: 32 g/dL (ref 30.0–36.0)
MCV: 92.9 fL (ref 78.0–100.0)
MONOS PCT: 5 % (ref 3–12)
Monocytes Absolute: 0.6 10*3/uL (ref 0.1–1.0)
Neutro Abs: 9.1 10*3/uL — ABNORMAL HIGH (ref 1.7–7.7)
Neutrophils Relative %: 79 % — ABNORMAL HIGH (ref 43–77)
PLATELETS: 191 10*3/uL (ref 150–400)
RBC: 2.66 MIL/uL — AB (ref 3.87–5.11)
RDW: 14.9 % (ref 11.5–15.5)
WBC: 11.6 10*3/uL — AB (ref 4.0–10.5)

## 2015-02-10 LAB — PREPARE RBC (CROSSMATCH)

## 2015-02-10 MED ORDER — SODIUM CHLORIDE 0.9 % IV BOLUS (SEPSIS)
500.0000 mL | Freq: Once | INTRAVENOUS | Status: AC
  Administered 2015-02-10: 500 mL via INTRAVENOUS

## 2015-02-10 MED ORDER — SODIUM CHLORIDE 0.9 % IV SOLN: Freq: Once | INTRAVENOUS | Status: DC

## 2015-02-10 MED ORDER — RIVAROXABAN 15 MG PO TABS
15.0000 mg | ORAL_TABLET | Freq: Every day | ORAL | Status: DC
  Administered 2015-02-11 – 2015-02-12 (×2): 15 mg via ORAL
  Filled 2015-02-10 (×3): qty 1

## 2015-02-10 MED ORDER — METHOCARBAMOL 1000 MG/10ML IJ SOLN
500.0000 mg | Freq: Three times a day (TID) | INTRAVENOUS | Status: DC | PRN
  Administered 2015-02-10 – 2015-02-11 (×3): 500 mg via INTRAVENOUS
  Filled 2015-02-10 (×5): qty 5

## 2015-02-10 MED ORDER — SODIUM CHLORIDE 0.9 % IV BOLUS (SEPSIS)
1000.0000 mL | Freq: Once | INTRAVENOUS | Status: AC
  Administered 2015-02-10: 1000 mL via INTRAVENOUS

## 2015-02-10 NOTE — Progress Notes (Signed)
PT Cancellation Note  Patient Details Name: Makayla Thomas MRN: [id number] DOB: February 11, 1926   Cancelled Treatment:     PT deferred this am - pt with BP 70/38.  Will follow.   Mazzy Santarelli 02/10/2015, 12:30 PM

## 2015-02-10 NOTE — Progress Notes (Signed)
NT reports BP of 81/43, manual BP done 92/44 with pulse of 83. Patient responding appropriately to questions with c/o pain. Notified Elray Mcgregor NP, orders r'cvd. Will continue to monitor closely and report to oncoming day shift RN.  Dorothyann Peng RN

## 2015-02-10 NOTE — Progress Notes (Signed)
Pt not yet voided, feels like she needs to but can't. Gently palpated bladder illiciting pain response from pt. Unable to pass I&O cath into bladder. P.Corbet, RN, able to get catheter in after 3 attempts, obtaining 930 cc clear amber urine. Pt tolerated well, reported much relief. Samiksha Pellicano, [employer]

## 2015-02-10 NOTE — Progress Notes (Signed)
   Subjective: 3 Days Post-Op Procedure(s) (LRB): OPEN REDUCTION INTERNAL FIXATION (ORIF) PERIPROSTHETIC FRACTURE WITH TOTAL HIP REVISION AND CABLES (Right) Patient reports pain as mild.   Patient seen in rounds with Dr. Darrelyn Hillock. Patient asleep in bed during rounds but did respond somewhat to stimuli.    Objective: Vital signs in last 24 hours: Temp:  [97.5 F (36.4 C)-98.5 F (36.9 C)] 97.5 F (36.4 C) (06/26 0630) Pulse Rate:  [64-83] 83 (06/26 0630) Resp:  [15-16] 16 (06/26 0630) BP: (81-106)/(42-65) 92/44 mmHg (06/26 0635) SpO2:  [100 %] 100 % (06/26 0630)  Intake/Output from previous day:  Intake/Output Summary (Last 24 hours) at 02/10/15 0833 Last data filed at 02/10/15 0630  Gross per 24 hour  Intake 1056.66 ml  Output    625 ml  Net 431.66 ml     Labs:  Recent Labs  02/08/15 0417 02/09/15 0828  HGB 9.8* 8.7*    Recent Labs  02/08/15 0417 02/09/15 0828  WBC 13.6* 17.8*  RBC 3.42* 2.99*  HCT 31.0* 27.3*  PLT 243 195    EXAM General - Patient sleeping but responsive to painful stimuli on right foot Extremity - Dorsiflexion/Plantar flexion intact No cellulitis present Compartment soft Dressing/Incision - clean, dry, no drainage Motor Function - intact, moving foot and toes well on exam.   Past Medical History  Diagnosis Date  . Permanent atrial fibrillation   . Chronic anticoagulation     on Xarelto  . Hypothyroidism   . Dyslipidemia     treated  . PAD (peripheral artery disease) 08/30/2009    Dopplers; with bil. post. tib artery occlusion  . Lower extremity edema     chronic  . Aortic stenosis, mild 08/2009    ECHO  EF 55-60%, wth increased EDP, mild   . H/O cardiovascular stress test 05/2010    Lexiscan cardiolite no ischemia or infarction  . History of ETT 02/2011    [place] exercise protocol, negative with poor exercise tolerance  . H/O multiple sclerosis     no excaerbations in some time  . Complication of anesthesia    hallusinations  . Dysrhythmia     A-FIB  . Visual disturbances     "FLASHING IN MY EYES"  . Arthritis   . Vaginal itching   . Constipation   . Anemia   . History of transfusion   . Borderline diabetes   . Transient ischemic attack (TIA)     NO RESIDUAL PROBLEMS  . Diabetes mellitus without complication     possibly    Assessment/Plan: 3 Days Post-Op Procedure(s) (LRB): OPEN REDUCTION INTERNAL FIXATION (ORIF) PERIPROSTHETIC FRACTURE WITH TOTAL HIP REVISION AND CABLES (Right) Principal Problem:   Periprosthetic fracture around internal prosthetic right hip joint Active Problems:   Chronic anticoagulation   Essential hypertension -well controlled   Chronic a-fib   Hypothyroidism  Estimated body mass index is 35.47 kg/(m^2) as calculated from the following:   Height as of this encounter: 5' 0.5" (1.537 m).   Weight as of this encounter: 83.8 kg (184 lb 11.9 oz). Advance diet Up with therapy  DVT Prophylaxis - Xarelto  Continue PT. DC disposition dependent on medical service.   Dimitri Ped, PA-C Orthopaedic Surgery 02/10/2015, 8:33 AM

## 2015-02-10 NOTE — Progress Notes (Signed)
Patient had foley catheter d/c'd 02/09/15 at 6pm. No void or urge to void, unable to palpate bladder, bladder scan shows total of 20cc urine. Reported to Elray Mcgregor NP. Dorothyann Peng RN

## 2015-02-10 NOTE — Plan of Care (Signed)
Problem: Phase III Progression Outcomes Goal: Anticoagulant follow-up in place Outcome: Not Applicable Date Met:  25/74/93 xarelto

## 2015-02-10 NOTE — Progress Notes (Signed)
Dr Blake Divine notified re: pt's BPs. Orders received. Corvette Orser, [employer]

## 2015-02-10 NOTE — Progress Notes (Signed)
[facility] PROGRESS NOTE  Makayla Thomas RUE:[id number] DOB: 29-Jun-1926 DOA: 02/04/2015 PCP: Londell Moh, MD   brief narrative 81 year old female with history of right total hip arthroplasty 1 week back, chronic A. fib on Xarelto, history of stroke, hypertension, hypothyroidism and hyperlipidemia presented from skilled nursing facility with three-day history of increasing right groin and right thigh pain. She reports that she was drop twice when moved from her bed to bedside commode. X-ray showed right. Prosthetic hip fracture. Patient seen by orthopedics and underwent ORIF.    Assessment/Plan: Periprosthetic right proximal femur fracture Recently had right THA on 6/14. Unclear if this was related to trauma during mobilization. Patient underwent ORIF on 6/23. Tolerated well. Up with therapy. Discontinued Foley in a.m. Pain control with prn Vicodin.  Resume Xarelto. Plan to Discharge to skilled nursing facility likely on 6/27  Chronic A. fib Rate controlled. Resumed Xarelto. Bisoprolol held for hypotension.   Hypotension: 1.5 lit OF NS bolus ordered and her bp improved. Bisoprolol , cardizem and lasix held today.   Hypothyroidism Continue Synthroid.  Acute blood loss anemia Continue iron supplements. Transfuse 1 unit prbc today. Repeat H&H tomorrow.   Proteus UTI On empiric IV Rocephin.  Fever on 6/24 No fever today, CXR shows atelectatasis. Elevated wbc count probably reactive as procalcitonin is negative.   Diet: Heart healthy DVT prophylaxis: On Xarelto  Code Status: Full code Family Communication:  None at bedside   Disposition Plan: Skilled nursing facility on 6/27   Consultants:  Dr. Charlann Boxer  Procedures:  ORIF rt hip  Antibiotics:  IV rocephin 6/23  HPI/Subjective: Patient seen and examined. Pain is better at rest.   Objective: Filed Vitals:   02/10/15 1521  BP: 102/51  Pulse: 67  Temp: 98.5 F (36.9 C)  Resp: 18    Intake/Output  Summary (Last 24 hours) at 02/10/15 1700 Last data filed at 02/10/15 1629  Gross per 24 hour  Intake   1645 ml  Output    500 ml  Net   1145 ml   Filed Weights   02/05/15 0740  Weight: 83.8 kg (184 lb 11.9 oz)    Exam:   General:  Elderly female in no distress  HEENT: Pallor present, moist oral mucosa, supple neck next   Chest: clear b/l, no added sounds  Cardiovascular: S1&S2 irregular, no murmurs  Respiratory: Clear b/l   Abdomen: soft, NT, ND, BS+, Foley in place  Musculoskeletal: Warm dressing lower right hip   CNS: Alert and oriented  Data Reviewed: Basic Metabolic Panel:  Recent Labs Lab 02/05/15 0048 02/05/15 0623 02/07/15 0455  NA 139 137 138  K 4.1 4.6 4.6  CL 100* 99* 101  CO2 GLUCOSE 140* 138* 111*  BUN CREATININE 0.73 0.77 0.66  CALCIUM 8.4* 8.4* 8.1*   Liver Function Tests:  Recent Labs Lab 02/05/15 0623  AST 20  ALT 17  ALKPHOS 94  BILITOT 0.7  PROT 6.3*  ALBUMIN 3.1*   No results for input(s): LIPASE, AMYLASE in the last 168 hours. No results for input(s): AMMONIA in the last 168 hours. CBC:  Recent Labs Lab 02/05/15 0048 02/05/15 0623 02/07/15 0455 02/08/15 0417 02/09/15 0828 02/10/15 1044  WBC 12.5* 11.5* 9.0 13.6* 17.8* 11.6*  NEUTROABS 9.8*  --   --   --   --  9.1*  HGB 11.4* 10.6* 9.7* 9.8* 8.7* 7.9*  HCT 34.3* 33.0* 29.3* 31.0* 27.3* 24.7*  MCV 90.3 91.4 92.7 90.6 91.3  92.9  PLT 321 301 302 243 195 191   Cardiac Enzymes: No results for input(s): CKTOTAL, CKMB, CKMBINDEX, TROPONINI in the last 168 hours. BNP (last 3 results) No results for input(s): BNP in the last 8760 hours.  ProBNP (last 3 results) No results for input(s): PROBNP in the last 8760 hours.  CBG:  Recent Labs Lab 02/07/15 0838 02/07/15 1210 02/07/15 2255 02/08/15 0755 02/08/15 1422  GLUCAP 103* 106* 157* 119* 166*    Recent Results (from the past 240 hour(s))  Culture, Urine     Status: None   Collection Time:  02/05/15  3:45 AM  Result Value Ref Range Status   Specimen Description URINE, CATHETERIZED  Final   Special Requests NONE  Final   Culture   Final    >=100,000 COLONIES/mL PROTEUS MIRABILIS Performed at [facility]    Report Status 02/07/2015 FINAL  Final   Organism ID, Bacteria PROTEUS MIRABILIS  Final      Susceptibility   Proteus mirabilis - MIC*    AMPICILLIN <=2 SENSITIVE Sensitive     CEFAZOLIN 8 SENSITIVE Sensitive     CEFTRIAXONE <=1 SENSITIVE Sensitive     CIPROFLOXACIN 2 INTERMEDIATE Intermediate     GENTAMICIN <=1 SENSITIVE Sensitive     IMIPENEM 8 INTERMEDIATE Intermediate     NITROFURANTOIN 128 RESISTANT Resistant     TRIMETH/SULFA <=20 SENSITIVE Sensitive     AMPICILLIN/SULBACTAM <=2 SENSITIVE Sensitive     PIP/TAZO <=4 SENSITIVE Sensitive     * >=100,000 COLONIES/mL PROTEUS MIRABILIS  Surgical pcr screen     Status: Abnormal   Collection Time: 02/06/15 12:17 AM  Result Value Ref Range Status   MRSA, PCR NEGATIVE NEGATIVE Final   Staphylococcus aureus POSITIVE (A) NEGATIVE Final    Comment:        The Xpert SA Assay (FDA approved for NASAL specimens in patients over 38 years of age), is one component of a comprehensive surveillance program.  Test performance has been validated by [facility] for patients greater than or equal to 55 year old. It is not intended to diagnose infection nor to guide or monitor treatment.      Studies: No results found.  Scheduled Meds: . sodium chloride   Intravenous Once  . antiseptic oral rinse  7 mL Mouth Rinse q12n4p  . cefTRIAXone (ROCEPHIN)  IV  1 g Intravenous Q24H  . chlorhexidine   Topical Once  . chlorhexidine  15 mL Mouth Rinse BID  . Chlorhexidine Gluconate Cloth  6 each Topical Daily  . cholecalciferol  1,000 Units Oral QPM  . diltiazem  120 mg Oral Daily  . docusate sodium  100 mg Oral BID  . feeding supplement (ENSURE ENLIVE)  237 mL Oral BID BM  . ferrous sulfate  325 mg Oral TID PC  .  furosemide  20 mg Oral q morning - 10a  . levothyroxine  125 mcg Oral QAC breakfast  . mupirocin ointment  1 application Nasal BID  . polyethylene glycol  17 g Oral BID  . pravastatin  40 mg Oral Daily  . [START ON 02/11/2015] rivaroxaban  15 mg Oral Q breakfast  . triamterene-hydrochlorothiazide  1 tablet Oral q morning - 10a   Continuous Infusions: . sodium chloride 0.9 % 1,000 mL with potassium chloride 10 mEq infusion Stopped (02/09/15 1520)      Time spent: 25 minutes    [facility]  [facility] Pager [phone]. If 7PM-7AM, please contact night-coverage at [email or web address], password [facility] 02/10/2015,  5:00 PM  LOS: 5 days

## 2015-02-11 ENCOUNTER — Encounter (HOSPITAL_COMMUNITY): Payer: Self-pay | Admitting: Orthopedic Surgery

## 2015-02-11 LAB — CBC
HCT: 28.3 % — ABNORMAL LOW (ref 36.0–46.0)
HEMOGLOBIN: 9.5 g/dL — AB (ref 12.0–15.0)
MCH: 30 pg (ref 26.0–34.0)
MCHC: 33.6 g/dL (ref 30.0–36.0)
MCV: 89.3 fL (ref 78.0–100.0)
Platelets: 222 10*3/uL (ref 150–400)
RBC: 3.17 MIL/uL — ABNORMAL LOW (ref 3.87–5.11)
RDW: 14.3 % (ref 11.5–15.5)
WBC: 12.2 10*3/uL — ABNORMAL HIGH (ref 4.0–10.5)

## 2015-02-11 LAB — BASIC METABOLIC PANEL
ANION GAP: 8 (ref 5–15)
BUN: 16 mg/dL (ref 6–20)
CHLORIDE: 105 mmol/L (ref 101–111)
CO2: 26 mmol/L (ref 22–32)
Calcium: 7.5 mg/dL — ABNORMAL LOW (ref 8.9–10.3)
Creatinine, Ser: 0.69 mg/dL (ref 0.44–1.00)
Glucose, Bld: 147 mg/dL — ABNORMAL HIGH (ref 65–99)
Potassium: 3.9 mmol/L (ref 3.5–5.1)
Sodium: 139 mmol/L (ref 135–145)

## 2015-02-11 LAB — TYPE AND SCREEN
ABO/RH(D): O POS
Antibody Screen: NEGATIVE
UNIT DIVISION: 0

## 2015-02-11 LAB — PROCALCITONIN: PROCALCITONIN: 0.14 ng/mL

## 2015-02-11 LAB — CLOSTRIDIUM DIFFICILE BY PCR: Toxigenic C. Difficile by PCR: POSITIVE — AB

## 2015-02-11 MED ORDER — SODIUM CHLORIDE 0.9 % IV SOLN
INTRAVENOUS | Status: DC
  Administered 2015-02-11: 10:00:00 via INTRAVENOUS
  Administered 2015-02-11: 75 mL via INTRAVENOUS

## 2015-02-11 MED ORDER — METRONIDAZOLE 500 MG PO TABS
500.0000 mg | ORAL_TABLET | Freq: Three times a day (TID) | ORAL | Status: DC
  Administered 2015-02-11 – 2015-02-12 (×4): 500 mg via ORAL
  Filled 2015-02-11 (×6): qty 1

## 2015-02-11 NOTE — Progress Notes (Signed)
C diff Enteric protocol ordered. Pt has had 3 loose bm's in past 24hrs.Stool is black,iron colored & iron odorous Linward Headland D

## 2015-02-11 NOTE — Progress Notes (Signed)
Physical Therapy Treatment Patient Details Name: Makayla Thomas MRN: [id number] DOB: Mar 30, 1926 Today's Date: 02/11/2015    History of Present Illness 81 yo female with recent hx of R THA-direct anterior 01/29/15 and admitted from SNF with persistant R hip pain and found to have right periprosthetic fracture with femoral component failure, now s/p ORIF and THA revision    PT Comments    POD # 4 limited activity tolerance due to multiple episodes voiding then loose stools.  Performed R LE TE's while in bed.  Very swollen upper R thigh and limited tolerance due to pain.  Lower R quads very weak 1/5.  AAROM required for most TE's.  ICE applied after.   Follow Up Recommendations  SNF     Equipment Recommendations       Recommendations for Other Services       Precautions / Restrictions Precautions Precautions: Fall;Posterior Hip Restrictions Weight Bearing Restrictions: Yes RLE Weight Bearing: Partial weight bearing RLE Partial Weight Bearing Percentage or Pounds: 50% Other Position/Activity Restrictions: limited to transfers    Mobility  Bed Mobility                  Transfers                    Ambulation/Gait                 Stairs            Wheelchair Mobility    Modified Rankin (Stroke Patients Only)       Balance                                    Cognition Arousal/Alertness: Awake/alert   Overall Cognitive Status: Within Functional Limits for tasks assessed                 General Comments: followed all commands    Exercises   Total Hip Replacement TE's 10 reps ankle pumps 10 reps knee presses 10 reps heel slides AAROM 10 reps SAQ's AAROM 10 reps ABD AAROM Followed by ICE     General Comments        Pertinent Vitals/Pain Pain Assessment: 0-10 Pain Score: 5  Pain Location: R hip during TE's Pain Descriptors / Indicators: Sore;Tender;Tightness;Constant Pain Intervention(s): Repositioned;Ice  applied    Home Living                      Prior Function            PT Goals (current goals can now be found in the care plan section) Progress towards PT goals: Progressing toward goals    Frequency  Min 3X/week    PT Plan      Co-evaluation             End of Session   Activity Tolerance: No increased pain;Patient limited by fatigue Patient left: in bed;with call bell/phone within reach     Time: 1515-1535 PT Time Calculation (min) (ACUTE ONLY): 20 min  Charges:  $Therapeutic Exercise: 8-22 mins                    G Codes:      Felecia Shelling  PTA [facility]  Acute  Rehab Pager      [phone]

## 2015-02-11 NOTE — Progress Notes (Signed)
[facility] PROGRESS NOTE  Makayla Thomas VOJ:[id number] DOB: 02/02/1926 DOA: 02/04/2015 PCP: Londell Moh, MD   brief narrative 81 year old female with history of right total hip arthroplasty 1 week back, chronic A. fib on Xarelto, history of stroke, hypertension, hypothyroidism and hyperlipidemia presented from skilled nursing facility with three-day history of increasing right groin and right thigh pain. She reports that she was drop twice when moved from her bed to bedside commode. X-ray showed right. Prosthetic hip fracture. Patient seen by orthopedics and underwent ORIF.    Assessment/Plan: Periprosthetic right proximal femur fracture Recently had right THA on 6/14. Unclear if this was related to trauma during mobilization. Patient underwent ORIF on 6/23. Tolerated well. Up with therapy. Pain control with prn Vicodin.  Resume Xarelto.   Chronic A. fib Rate controlled. Resumed Xarelto. Bisoprolol held for hypotension.   Hypotension: probably from c diff colitis.  1.5 lit OF NS bolus ordered and her bp improved. Bisoprolol , cardizem and lasix held today.   Hypothyroidism Continue Synthroid.  Acute blood loss anemia Continue iron supplements. Transfuse 1 unit prbc today. Repeat H&H tomorrow.   Proteus UTI Completed the treatment for UTI.   Fever on 6/24 No fever today, CXR shows atelectatasis. Elevated wbc count probably reactive as procalcitonin is negative.   C DIFF colitis: - started on flagyl.   Diet: Heart healthy DVT prophylaxis: On Xarelto  Code Status: Full code Family Communication:  None at bedside   Disposition Plan: Skilled nursing facility on 6/27   Consultants:  Dr. Charlann Boxer  Procedures:  ORIF rt hip  Antibiotics:  IV rocephin 6/23- 6/27  Flagyl 6/27  HPI/Subjective: Patient seen and examined. Pain is better at rest. Reports feeling nauseated   Objective: Filed Vitals:   02/11/15 1343  BP: 115/69  Pulse: 90  Temp: 99.6  F (37.6 C)  Resp: 18    Intake/Output Summary (Last 24 hours) at 02/11/15 1921 Last data filed at 02/11/15 1820  Gross per 24 hour  Intake   1365 ml  Output   4100 ml  Net  -2735 ml   Filed Weights   02/05/15 0740  Weight: 83.8 kg (184 lb 11.9 oz)    Exam:   General:  Elderly female in no distress  HEENT: Pallor present, moist oral mucosa, supple neck next   Chest: clear b/l, no added sounds  Cardiovascular: S1&S2 irregular, no murmurs  Respiratory: Clear b/l   Abdomen: soft, NT, ND, BS+, Foley in place  Musculoskeletal: Warm dressing lower right hip   CNS: Alert and oriented  Data Reviewed: Basic Metabolic Panel:  Recent Labs Lab 02/05/15 0048 02/05/15 0623 02/07/15 0455 02/11/15 0645  NA 139 137 138 139  K 4.1 4.6 4.6 3.9  CL 100* 99* 101 105  CO2 26 28 28 26   GLUCOSE 140* 138* 111* 147*  BUN 18 17 18 16   CREATININE 0.73 0.77 0.66 0.69  CALCIUM 8.4* 8.4* 8.1* 7.5*   Liver Function Tests:  Recent Labs Lab 02/05/15 0623  AST 20  ALT 17  ALKPHOS 94  BILITOT 0.7  PROT 6.3*  ALBUMIN 3.1*   No results for input(s): LIPASE, AMYLASE in the last 168 hours. No results for input(s): AMMONIA in the last 168 hours. CBC:  Recent Labs Lab 02/05/15 0048  02/07/15 0455 02/08/15 0417 02/09/15 0828 02/10/15 1044 02/11/15 0530  WBC 12.5*  < > 9.0 13.6* 17.8* 11.6* 12.2*  NEUTROABS 9.8*  --   --   --   --  9.1*  --  HGB 11.4*  < > 9.7* 9.8* 8.7* 7.9* 9.5*  HCT 34.3*  < > 29.3* 31.0* 27.3* 24.7* 28.3*  MCV 90.3  < > 92.7 90.6 91.3 92.9 89.3  PLT 321  < > 302 243 195 191 222  < > = values in this interval not displayed. Cardiac Enzymes: No results for input(s): CKTOTAL, CKMB, CKMBINDEX, TROPONINI in the last 168 hours. BNP (last 3 results) No results for input(s): BNP in the last 8760 hours.  ProBNP (last 3 results) No results for input(s): PROBNP in the last 8760 hours.  CBG:  Recent Labs Lab 02/07/15 0838 02/07/15 1210 02/07/15 2255  02/08/15 0755 02/08/15 1422  GLUCAP 103* 106* 157* 119* 166*    Recent Results (from the past 240 hour(s))  Culture, Urine     Status: None   Collection Time: 02/05/15  3:45 AM  Result Value Ref Range Status   Specimen Description URINE, CATHETERIZED  Final   Special Requests NONE  Final   Culture   Final    >=100,000 COLONIES/mL PROTEUS MIRABILIS Performed at [facility]    Report Status 02/07/2015 FINAL  Final   Organism ID, Bacteria PROTEUS MIRABILIS  Final      Susceptibility   Proteus mirabilis - MIC*    AMPICILLIN <=2 SENSITIVE Sensitive     CEFAZOLIN 8 SENSITIVE Sensitive     CEFTRIAXONE <=1 SENSITIVE Sensitive     CIPROFLOXACIN 2 INTERMEDIATE Intermediate     GENTAMICIN <=1 SENSITIVE Sensitive     IMIPENEM 8 INTERMEDIATE Intermediate     NITROFURANTOIN 128 RESISTANT Resistant     TRIMETH/SULFA <=20 SENSITIVE Sensitive     AMPICILLIN/SULBACTAM <=2 SENSITIVE Sensitive     PIP/TAZO <=4 SENSITIVE Sensitive     * >=100,000 COLONIES/mL PROTEUS MIRABILIS  Surgical pcr screen     Status: Abnormal   Collection Time: 02/06/15 12:17 AM  Result Value Ref Range Status   MRSA, PCR NEGATIVE NEGATIVE Final   Staphylococcus aureus POSITIVE (A) NEGATIVE Final    Comment:        The Xpert SA Assay (FDA approved for NASAL specimens in patients over 33 years of age), is one component of a comprehensive surveillance program.  Test performance has been validated by [facility] for patients greater than or equal to 54 year old. It is not intended to diagnose infection nor to guide or monitor treatment.   Clostridium Difficile by PCR (not at [facility])     Status: Abnormal   Collection Time: 02/11/15  5:30 AM  Result Value Ref Range Status   C difficile by pcr POSITIVE (A) NEGATIVE Final    Comment: CRITICAL RESULT CALLED TO, READ BACK BY AND VERIFIED WITH: L. DRAPER RN AT 06.27.16 BY SHUEA AT 1191.      Studies: No results found.  Scheduled Meds: . antiseptic oral  rinse  7 mL Mouth Rinse q12n4p  . chlorhexidine  15 mL Mouth Rinse BID  . Chlorhexidine Gluconate Cloth  6 each Topical Daily  . cholecalciferol  1,000 Units Oral QPM  . diltiazem  120 mg Oral Daily  . feeding supplement (ENSURE ENLIVE)  237 mL Oral BID BM  . ferrous sulfate  325 mg Oral TID PC  . furosemide  20 mg Oral q morning - 10a  . levothyroxine  125 mcg Oral QAC breakfast  . metroNIDAZOLE  500 mg Oral 3 times per day  . mupirocin ointment  1 application Nasal BID  . pravastatin  40 mg Oral Daily  .  rivaroxaban  15 mg Oral Q breakfast  . triamterene-hydrochlorothiazide  1 tablet Oral q morning - 10a   Continuous Infusions: . sodium chloride 75 mL/hr at 02/11/15 0951      Time spent: 25 minutes    [facility]  [facility] Pager [phone]. If 7PM-7AM, please contact night-coverage at [email or web address], password [facility] 02/11/2015, 7:21 PM  LOS: 6 days

## 2015-02-11 NOTE — Progress Notes (Signed)
CRITICAL VALUE ALERT  Critical value received:  Positive CDiff  Date of notification:  02/11/15  Time of notification:  0912  Critical value read back:Yes.    Nurse who received alert:  Bernette Redbird, RN  MD notified (1st page):  Blake Divine  Time of first page:  0912  MD notified (2nd page):  Time of second page:  Responding MD:  Blake Divine  Time MD responded:  [phone]

## 2015-02-11 NOTE — Care Management (Signed)
IM LETTER GIVEN TO PATIENT  

## 2015-02-11 NOTE — Clinical Social Work Note (Signed)
CSW met with patient who advised that one of her son's was on his way in to town as he lives out of state.  Patient advised that she plans on going to rehab at [facility].  She indicated that upon completion of her rehab she will will return to her residential community for the elderly and cares for herself.  Patient was bothered by learning that she had C.Diff.  CSW provided a safe environment for patient to express her concerns related to the C. Diff.  Patient advised that she just wanted to be cured of the infection. CSW provided supported counseling.    CSW spoke with Carolynn at [facility], who advised that they had offered a bed to patient and wanted to come by and have patient and/or her son to sign her paperwork.    Lewis Grivas D, LCSW. [phone]

## 2015-02-11 NOTE — Progress Notes (Signed)
Pt unable to void since foley dc/d at 1800 on 02/09/15.see t[facility] progress note.Pt scanned for at 2345 on 02/10/15 and still unable to void.NP Schorr notified and orders received.# 14 French foley catheter inserted on first attempt.Draining clear yellow urine.VSS A&O x4.Linward Headland D

## 2015-02-11 NOTE — Progress Notes (Signed)
Patient ID: Makayla Thomas, female   DOB: 03/04/1926, 81 y.o.   MRN: [id number] Subjective: 4 Days Post-Op Procedure(s) (LRB): OPEN REDUCTION INTERNAL FIXATION (ORIF) PERIPROSTHETIC FRACTURE WITH TOTAL HIP REVISION AND CABLES (Right)    Patient reports pain as moderate.  Some confusion persistent when on narcotics.  Have reduced to using only tylenol and robaxin.  Loose dark stools noted by nursing.  Limited activity thus far.  SNF at discharge to be determined based on activity level  Objective:   VITALS:   Filed Vitals:   02/11/15 0800  BP: 122/59  Pulse: 82  Temp: 99.7 F (37.6 C)  Resp: 20    Neurovascular intact Incision: dressing C/D/I  LABS  Recent Labs  02/09/15 0828 02/10/15 1044 02/11/15 0530  HGB 8.7* 7.9* 9.5*  HCT 27.3* 24.7* 28.3*  WBC 17.8* 11.6* 12.2*  PLT 195 191 222     Recent Labs  02/11/15 0645  NA 139  K 3.9  BUN 16  CREATININE 0.69  GLUCOSE 147*    No results for input(s): LABPT, INR in the last 72 hours.   Assessment/Plan: 4 Days Post-Op Procedure(s) (LRB): OPEN REDUCTION INTERNAL FIXATION (ORIF) PERIPROSTHETIC FRACTURE WITH TOTAL HIP REVISION AND CABLES (Right)   Up with therapy Discharge to SNF when medically stable  Discussed activity while in bed to build strength SW working on placement  C. Diff labs pending Probable discharge tomorrow or Wednesday based on results and activity

## 2015-02-12 DIAGNOSIS — K219 Gastro-esophageal reflux disease without esophagitis: Secondary | ICD-10-CM | POA: Diagnosis not present

## 2015-02-12 DIAGNOSIS — Z9181 History of falling: Secondary | ICD-10-CM | POA: Diagnosis not present

## 2015-02-12 DIAGNOSIS — Z4789 Encounter for other orthopedic aftercare: Secondary | ICD-10-CM | POA: Diagnosis not present

## 2015-02-12 DIAGNOSIS — R42 Dizziness and giddiness: Secondary | ICD-10-CM | POA: Diagnosis not present

## 2015-02-12 DIAGNOSIS — E785 Hyperlipidemia, unspecified: Secondary | ICD-10-CM | POA: Diagnosis not present

## 2015-02-12 DIAGNOSIS — T84040A Periprosthetic fracture around internal prosthetic right hip joint, initial encounter: Secondary | ICD-10-CM | POA: Diagnosis not present

## 2015-02-12 DIAGNOSIS — Z7901 Long term (current) use of anticoagulants: Secondary | ICD-10-CM | POA: Diagnosis not present

## 2015-02-12 DIAGNOSIS — M6281 Muscle weakness (generalized): Secondary | ICD-10-CM | POA: Diagnosis not present

## 2015-02-12 DIAGNOSIS — D62 Acute posthemorrhagic anemia: Secondary | ICD-10-CM | POA: Diagnosis not present

## 2015-02-12 DIAGNOSIS — T84041D Periprosthetic fracture around internal prosthetic left hip joint, subsequent encounter: Secondary | ICD-10-CM | POA: Diagnosis not present

## 2015-02-12 DIAGNOSIS — M21251 Flexion deformity, right hip: Secondary | ICD-10-CM | POA: Diagnosis not present

## 2015-02-12 DIAGNOSIS — E039 Hypothyroidism, unspecified: Secondary | ICD-10-CM | POA: Diagnosis not present

## 2015-02-12 DIAGNOSIS — S7291XS Unspecified fracture of right femur, sequela: Secondary | ICD-10-CM | POA: Diagnosis not present

## 2015-02-12 DIAGNOSIS — Z471 Aftercare following joint replacement surgery: Secondary | ICD-10-CM | POA: Diagnosis not present

## 2015-02-12 DIAGNOSIS — A047 Enterocolitis due to Clostridium difficile: Secondary | ICD-10-CM | POA: Diagnosis not present

## 2015-02-12 DIAGNOSIS — R1311 Dysphagia, oral phase: Secondary | ICD-10-CM | POA: Diagnosis not present

## 2015-02-12 DIAGNOSIS — R2681 Unsteadiness on feet: Secondary | ICD-10-CM | POA: Diagnosis not present

## 2015-02-12 DIAGNOSIS — I10 Essential (primary) hypertension: Secondary | ICD-10-CM | POA: Diagnosis not present

## 2015-02-12 DIAGNOSIS — I482 Chronic atrial fibrillation: Secondary | ICD-10-CM | POA: Diagnosis not present

## 2015-02-12 DIAGNOSIS — I4891 Unspecified atrial fibrillation: Secondary | ICD-10-CM | POA: Diagnosis not present

## 2015-02-12 DIAGNOSIS — R278 Other lack of coordination: Secondary | ICD-10-CM | POA: Diagnosis not present

## 2015-02-12 DIAGNOSIS — E46 Unspecified protein-calorie malnutrition: Secondary | ICD-10-CM | POA: Diagnosis not present

## 2015-02-12 DIAGNOSIS — Z96641 Presence of right artificial hip joint: Secondary | ICD-10-CM | POA: Diagnosis not present

## 2015-02-12 DIAGNOSIS — K5900 Constipation, unspecified: Secondary | ICD-10-CM | POA: Diagnosis not present

## 2015-02-12 LAB — BASIC METABOLIC PANEL
Anion gap: 8 (ref 5–15)
BUN: 11 mg/dL (ref 6–20)
CO2: 28 mmol/L (ref 22–32)
CREATININE: 0.58 mg/dL (ref 0.44–1.00)
Calcium: 7.7 mg/dL — ABNORMAL LOW (ref 8.9–10.3)
Chloride: 103 mmol/L (ref 101–111)
GFR calc non Af Amer: >60 mL/min (ref >60)
Glucose, Bld: 104 mg/dL — ABNORMAL HIGH (ref 65–99)
POTASSIUM: 3.8 mmol/L (ref 3.5–5.1)
Sodium: 139 mmol/L (ref 135–145)

## 2015-02-12 LAB — CBC
HCT: 29.2 % — ABNORMAL LOW (ref 36.0–46.0)
Hemoglobin: 9.3 g/dL — ABNORMAL LOW (ref 12.0–15.0)
MCH: 28.9 pg (ref 26.0–34.0)
MCHC: 31.8 g/dL (ref 30.0–36.0)
MCV: 90.7 fL (ref 78.0–100.0)
Platelets: 276 10*3/uL (ref 150–400)
RBC: 3.22 MIL/uL — AB (ref 3.87–5.11)
RDW: 14.6 % (ref 11.5–15.5)
WBC: 8.8 10*3/uL (ref 4.0–10.5)

## 2015-02-12 MED ORDER — ONDANSETRON HCL 4 MG PO TABS: 4.0000 mg | ORAL_TABLET | Freq: Four times a day (QID) | ORAL | Status: DC | PRN

## 2015-02-12 MED ORDER — TRAMADOL HCL 50 MG PO TABS: 50.0000 mg | ORAL_TABLET | Freq: Four times a day (QID) | ORAL | Status: DC | PRN

## 2015-02-12 MED ORDER — RIVAROXABAN 15 MG PO TABS: 15.0000 mg | ORAL_TABLET | Freq: Every day | ORAL | Status: DC

## 2015-02-12 MED ORDER — METRONIDAZOLE 500 MG PO TABS: 500.0000 mg | ORAL_TABLET | Freq: Three times a day (TID) | ORAL | Status: AC

## 2015-02-12 MED ORDER — BISOPROLOL FUMARATE 10 MG PO TABS
10.0000 mg | ORAL_TABLET | Freq: Every day | ORAL | Status: DC
  Administered 2015-02-12: 10 mg via ORAL
  Filled 2015-02-12: qty 1

## 2015-02-12 MED ORDER — ENSURE ENLIVE PO LIQD: 237.0000 mL | Freq: Two times a day (BID) | ORAL | Status: DC

## 2015-02-12 NOTE — Clinical Social Work Note (Addendum)
CSW spoke with Carolynn from [facility].  Carolynn advised that patient would need a private room and they have a private room today, however she is unsure as to whether she will have a private room available for tomorrow.  CSW spoke with patient's son regarding patient's discharge plan and the need for her to have a private room.   Annice Needy, [place] [phone]

## 2015-02-12 NOTE — Discharge Summary (Addendum)
Physician Discharge Summary  Makayla Thomas ZOX:[id number] DOB: February 14, 1926 DOA: 02/04/2015  PCP: Londell Moh, MD  Admit date: 02/04/2015 Discharge date: 02/12/2015  Time spent: 30 minutes  Recommendations for Outpatient Follow-up:  1. Follow up with orthopedics as recommended.  2. Please follow up with PCP in one week.   Discharge Diagnoses:  Principal Problem:   Periprosthetic fracture around internal prosthetic right hip joint Active Problems:   Chronic anticoagulation   Essential hypertension -well controlled   Chronic a-fib   Hypothyroidism   Discharge Condition: improved.   Diet recommendation: low sodium diet  Filed Weights   02/05/15 0740  Weight: 83.8 kg (184 lb 11.9 oz)    History of present illness:  81 year old female with history of right total hip arthroplasty 1 week back, chronic A. fib on Xarelto, history of stroke, hypertension, hypothyroidism and hyperlipidemia presented from skilled nursing facility with three-day history of increasing right groin and right thigh pain. She reports that she was drop twice when moved from her bed to bedside commode. X-ray showed right. Prosthetic hip fracture. Patient seen by orthopedics and underwent ORIF.  Hospital Course:  Periprosthetic right proximal femur fracture Recently had right THA on 6/14. Unclear if this was related to trauma during mobilization. Patient underwent ORIF on 6/23. Tolerated well. Up with therapy. Pain control with prn Vicodin.  Resume Xarelto.   Chronic A. fib Rate controlled. Resumed Xarelto. Bisoprolol restarted.   Hypotension: probably from c diff colitis.  Resolved.   Hypothyroidism Continue Synthroid.  Acute blood loss anemia Stable after a unit of prbc transfusion.    Proteus UTI Completed the treatment for UTI.   Fever on 6/24 No fever today, CXR shows atelectatasis.probably from c diff.   C DIFF colitis: - started on flagyl.   Procedures:  ORIF rt  hip  Consultations:  Orthopedics.   Discharge Exam: Filed Vitals:   02/12/15 0529  BP: 118/68  Pulse: 107  Temp: 99.7 F (37.6 C)  Resp: 18    General: alert afebrile comfortable Cardiovascular: s1s2 Respiratory:ctab  Discharge Instructions   Discharge Instructions    Diet - low sodium heart healthy    Complete by:  As directed      Discharge instructions    Complete by:  As directed   Follow up with PCP in one week.  Follow up with orthopedics as recommended.          Current Discharge Medication List    START taking these medications   Details  feeding supplement, ENSURE ENLIVE, (ENSURE ENLIVE) LIQD Take 237 mLs by mouth 2 (two) times daily between meals. Qty: 237 mL, Refills: 12    metroNIDAZOLE (FLAGYL) 500 MG tablet Take 1 tablet (500 mg total) by mouth every 8 (eight) hours. Qty: 39 tablet, Refills: 0    ondansetron (ZOFRAN) 4 MG tablet Take 1 tablet (4 mg total) by mouth every 6 (six) hours as needed for nausea. Qty: 20 tablet, Refills: 0      CONTINUE these medications which have CHANGED   Details  Rivaroxaban (XARELTO) 15 MG TABS tablet Take 1 tablet (15 mg total) by mouth daily with breakfast. Qty: 30 tablet      CONTINUE these medications which have NOT CHANGED   Details  acetaminophen (TYLENOL) 325 MG tablet Take 1-2 tablets (325-650 mg total) by mouth every 6 (six) hours as needed for moderate pain.    bisoprolol (ZEBETA) 10 MG tablet Take 1 tablet (10 mg total) by mouth daily. Qty: 30 tablet, Refills:  2    cholecalciferol (VITAMIN D) 1000 UNITS tablet Take 1,000 Units by mouth every evening.     diltiazem (CARDIZEM CD) 120 MG 24 hr capsule Take 120 mg by mouth daily.    docusate sodium (COLACE) 100 MG capsule Take 1 capsule (100 mg total) by mouth 2 (two) times daily. Qty: 10 capsule, Refills: 0    ferrous sulfate 325 (65 FE) MG tablet Take 1 tablet (325 mg total) by mouth 3 (three) times daily after meals. Refills: 3    furosemide  (LASIX) 20 MG tablet Take 20 mg by mouth every morning.    pravastatin (PRAVACHOL) 40 MG tablet Take 40 mg by mouth daily.    SYNTHROID 125 MCG tablet Take 125 mcg by mouth daily.    tiZANidine (ZANAFLEX) 4 MG tablet Take 1 tablet (4 mg total) by mouth every 6 (six) hours as needed for muscle spasms. Qty: 40 tablet, Refills: 0    triamterene-hydrochlorothiazide (MAXZIDE-25) 37.5-25 MG per tablet Take 1 tablet by mouth every morning.      STOP taking these medications     polyethylene glycol (MIRALAX / GLYCOLAX) packet      tuberculin (APLISOL) 5 UNIT/0.1ML injection      traMADol (ULTRAM) 50 MG tablet        Allergies  Allergen Reactions  . Tetanus Antitoxin Anaphylaxis    Throat swelling  . Meclizine Other (See Comments)    Had funny feeling      The results of significant diagnostics from this hospitalization (including imaging, microbiology, ancillary and laboratory) are listed below for reference.    Significant Diagnostic Studies: Dg Chest 1 View  02/05/2015   CLINICAL DATA:  Patient with prior right hip replacement. Recent diagnosis right femur fracture.  EXAM: CHEST  1 VIEW  COMPARISON:  Chest radiograph 08/29/2014  FINDINGS: Monitoring leads overlie the patient. Stable enlarged cardiac and mediastinal contours with tortuosity and calcification of the thoracic aorta. Bilateral coarse interstitial opacities. No large consolidative pulmonary opacity. No pleural effusion or pneumothorax.  IMPRESSION: Cardiomegaly.  No acute cardiopulmonary process.   Electronically Signed   By: Annia Belt M.D.   On: 02/05/2015 01:57   Dg Pelvis 1-2 Views  02/05/2015   CLINICAL DATA:  Patient with recent proximal right femur fracture.  EXAM: PELVIS - 1-2 VIEW  COMPARISON:  None.  FINDINGS: Re- demonstrated comminuted proximal right femur fracture about right femoral hardware. Lower lumbar spine degenerative changes. SI joints are unremarkable. Pelvic phleboliths.  IMPRESSION: Re-  demonstrated comminuted proximal right femur fracture about the hardware. Pelvic ring remains intact.   Electronically Signed   By: Annia Belt M.D.   On: 02/05/2015 02:07   Dg Pelvis Portable  02/08/2015   CLINICAL DATA:  81 year old female status post right hip surgery. Initial encounter.  EXAM: PORTABLE PELVIS 1-2 VIEWS  COMPARISON:  02/05/2015. Right femur series from 1934 hours on 02/07/2015.  FINDINGS: 2 portable AP views of the lower pelvis and right femur. Sequelae of right total hip arthroplasty. Right femoral implant has been revised since 02/05/2015. Longer right femoral stem is in place with re- identified periprosthetic longitudinal fracture tracking into the distal right femoral shaft (image 2).  Stable visualized bony pelvis. Visible left femur remarkable for partially visible left knee arthroplasty.  IMPRESSION: Right femoral shaft periprosthetic fracture Re-identified.   Electronically Signed   By: Odessa Fleming M.D.   On: 02/08/2015 08:58   Dg Chest Port 1 View  02/08/2015   CLINICAL DATA:  Productive  cough and fever for days.  EXAM: PORTABLE CHEST - 1 VIEW  COMPARISON:  02/05/2015 and 08/29/2014  FINDINGS: Lungs are adequately inflated with mild left basilar opacification which may be due to atelectasis and small amount of pleural fluid versus infection. Mild stable cardiomegaly. Calcified plaque over the aortic arch. There are degenerative changes of the spine.  IMPRESSION: Mild left basilar opacification which may be due to atelectasis and small effusion, although cannot exclude infection in the left base.  Stable mild cardiomegaly.   Electronically Signed   By: Elberta Fortis M.D.   On: 02/08/2015 16:26   Dg C-arm 1-60 Min-no Report  01/29/2015   CLINICAL DATA: surg   C-ARM 1-60 MINUTES  Fluoroscopy was utilized by the requesting physician.  No radiographic  interpretation.    Dg Hip Port Unilat With Pelvis 1v Right  01/29/2015   CLINICAL DATA:  Status post right hip replacement today.   EXAM: RIGHT HIP (WITH PELVIS) 1 VIEW PORTABLE  COMPARISON:  None.  FINDINGS: Right total hip arthroplasty is in place. The device is located and no fracture is identified. Gas in the soft tissues from surgery is noted.  IMPRESSION: Right total hip replacement without complicating feature.   Electronically Signed   By: Drusilla Kanner M.D.   On: 01/29/2015 11:03   Dg Hip Unilat With Pelvis 2-3 Views Right  02/07/2015   CLINICAL DATA:  Revision of right hip replacement.  EXAM: RIGHT HIP (WITH PELVIS) 2-3 VIEWS  COMPARISON:  Plain films the right hip 01/29/2015.  FINDINGS: Single intraoperative view of the right hip is identified. Surgical soft tissue wound is identified. The patient has a new long stem normal finding in place. Cerclage wires are identified scratch at 3 cerclage wires are present about the stem. There is a periprosthetic fracture off the tip of the stem.  IMPRESSION: Revision the femoral component of right hip replacement. Periprosthetic femur fracture is seen off the tip of the new long femoral stem.  Critical Value/emergent results were called by telephone at the time of interpretation on 02/07/2015 at 7:56 pm to Edythe Lynn, RN., the patient's nurse who verbally acknowledged these results.   Electronically Signed   By: Drusilla Kanner M.D.   On: 02/07/2015 19:58   Dg Femur, Min 2 Views Right  02/05/2015   CLINICAL DATA:  Patient with recent right hip replacement. Prior radiograph showing proximal right femur fracture.  EXAM: RIGHT FEMUR 2 VIEWS  COMPARISON:  01/29/2015  FINDINGS: Patient status post right hip arthroplasty. Comminuted fracture of the proximal right femur with associated foreshortening. Degenerative changes of the knee. Vascular calcifications. Pelvic phleboliths.  IMPRESSION: Comminuted proximal right femur fracture with associated foreshortening about the femoral stem of the right hip replacement.   Electronically Signed   By: Annia Belt M.D.   On: 02/05/2015 01:55     Microbiology: Recent Results (from the past 240 hour(s))  Culture, Urine     Status: None   Collection Time: 02/05/15  3:45 AM  Result Value Ref Range Status   Specimen Description URINE, CATHETERIZED  Final   Special Requests NONE  Final   Culture   Final    >=100,000 COLONIES/mL PROTEUS MIRABILIS Performed at [facility]    Report Status 02/07/2015 FINAL  Final   Organism ID, Bacteria PROTEUS MIRABILIS  Final      Susceptibility   Proteus mirabilis - MIC*    AMPICILLIN <=2 SENSITIVE Sensitive     CEFAZOLIN 8 SENSITIVE Sensitive  CEFTRIAXONE <=1 SENSITIVE Sensitive     CIPROFLOXACIN 2 INTERMEDIATE Intermediate     GENTAMICIN <=1 SENSITIVE Sensitive     IMIPENEM 8 INTERMEDIATE Intermediate     NITROFURANTOIN 128 RESISTANT Resistant     TRIMETH/SULFA <=20 SENSITIVE Sensitive     AMPICILLIN/SULBACTAM <=2 SENSITIVE Sensitive     PIP/TAZO <=4 SENSITIVE Sensitive     * >=100,000 COLONIES/mL PROTEUS MIRABILIS  Surgical pcr screen     Status: Abnormal   Collection Time: 02/06/15 12:17 AM  Result Value Ref Range Status   MRSA, PCR NEGATIVE NEGATIVE Final   Staphylococcus aureus POSITIVE (A) NEGATIVE Final    Comment:        The Xpert SA Assay (FDA approved for NASAL specimens in patients over 74 years of age), is one component of a comprehensive surveillance program.  Test performance has been validated by [facility] for patients greater than or equal to 32 year old. It is not intended to diagnose infection nor to guide or monitor treatment.   Clostridium Difficile by PCR (not at [facility])     Status: Abnormal   Collection Time: 02/11/15  5:30 AM  Result Value Ref Range Status   C difficile by pcr POSITIVE (A) NEGATIVE Final    Comment: CRITICAL RESULT CALLED TO, READ BACK BY AND VERIFIED WITH: L. DRAPER RN AT 06.27.16 BY SHUEA AT [id number].      Labs: Basic Metabolic Panel:  Recent Labs Lab 02/07/15 0455 02/11/15 0645 02/12/15 0515  NA 138 139 139  K  4.6 3.9 3.8  CL 101 105 103  CO2 28 26 28   GLUCOSE 111* 147* 104*  BUN 18 16 11   CREATININE 0.66 0.69 0.58  CALCIUM 8.1* 7.5* 7.7*   Liver Function Tests: No results for input(s): AST, ALT, ALKPHOS, BILITOT, PROT, ALBUMIN in the last 168 hours. No results for input(s): LIPASE, AMYLASE in the last 168 hours. No results for input(s): AMMONIA in the last 168 hours. CBC:  Recent Labs Lab 02/08/15 0417 02/09/15 0828 02/10/15 1044 02/11/15 0530 02/12/15 0515  WBC 13.6* 17.8* 11.6* 12.2* 8.8  NEUTROABS  --   --  9.1*  --   --   HGB 9.8* 8.7* 7.9* 9.5* 9.3*  HCT 31.0* 27.3* 24.7* 28.3* 29.2*  MCV 90.6 91.3 92.9 89.3 90.7  PLT 243 195 191 222 276   Cardiac Enzymes: No results for input(s): CKTOTAL, CKMB, CKMBINDEX, TROPONINI in the last 168 hours. BNP: BNP (last 3 results) No results for input(s): BNP in the last 8760 hours.  ProBNP (last 3 results) No results for input(s): PROBNP in the last 8760 hours.  CBG:  Recent Labs Lab 02/07/15 0838 02/07/15 1210 02/07/15 2255 02/08/15 0755 02/08/15 1422  GLUCAP 103* 106* 157* 119* 166*       Signed:  Mikolaj Woolstenhulme  [facility] 02/12/2015, 1:52 PM

## 2015-02-12 NOTE — Progress Notes (Signed)
Patient ID: Makayla Thomas, female   DOB: June 01, 1926, 81 y.o.   MRN: [id number] Subjective: 5 Days Post-Op Procedure(s) (LRB): OPEN REDUCTION INTERNAL FIXATION (ORIF) PERIPROSTHETIC FRACTURE WITH TOTAL HIP REVISION AND CABLES (Right)    Patient reports pain as moderate.  Limited activity still but working with PT. Recent diagnosis of C-diff. On Flagyl   Objective:   VITALS:   Filed Vitals:   02/12/15 0529  BP: 118/68  Pulse: 107  Temp: 99.7 F (37.6 C)  Resp: 18    Neurovascular intact Incision: dressing C/D/I  LABS  Recent Labs  02/10/15 1044 02/11/15 0530 02/12/15 0515  HGB 7.9* 9.5* 9.3*  HCT 24.7* 28.3* 29.2*  WBC 11.6* 12.2* 8.8  PLT 191 222 276     Recent Labs  02/11/15 0645 02/12/15 0515  NA 139 139  K 3.9 3.8  BUN 16 11  CREATININE 0.69 0.58  GLUCOSE 147* 104*    No results for input(s): LABPT, INR in the last 72 hours.   Assessment/Plan: 5 Days Post-Op Procedure(s) (LRB): OPEN REDUCTION INTERNAL FIXATION (ORIF) PERIPROSTHETIC FRACTURE WITH TOTAL HIP REVISION AND CABLES (Right)   Advance diet Up with therapy Discharge to SNF when medically cleared PWB RLE until further directed

## 2015-02-12 NOTE — Clinical Social Work Note (Signed)
CSW spoke with Carolynn at [facility] and advised that patient would be discharged today.  Carolynn advised that patient's son, Brett Canales, was currently in the facility signing paperwork. Carolynn advised Brett Canales that patient was being discharged.  CSW advised that patient would be transported by the non emergency [facility].  Annice Needy, [place] [phone]

## 2015-02-12 NOTE — Progress Notes (Signed)
Patient is for discharge to [facility]. This RN called report to Lavonna Rua RN. Awaiting transport

## 2015-02-12 NOTE — Progress Notes (Signed)
Transport arrived.  IV removed.  Pt placed in paper scrubs and yellow socks.  All last minute questions pertaining to discharge were answered. Vital signs stable.  Makayla Thomas

## 2015-02-13 ENCOUNTER — Non-Acute Institutional Stay (SKILLED_NURSING_FACILITY): Payer: Medicare Other | Admitting: Adult Health

## 2015-02-13 ENCOUNTER — Encounter: Payer: Self-pay | Admitting: Adult Health

## 2015-02-13 DIAGNOSIS — I10 Essential (primary) hypertension: Secondary | ICD-10-CM

## 2015-02-13 DIAGNOSIS — K5900 Constipation, unspecified: Secondary | ICD-10-CM | POA: Diagnosis not present

## 2015-02-13 DIAGNOSIS — A047 Enterocolitis due to Clostridium difficile: Secondary | ICD-10-CM | POA: Diagnosis not present

## 2015-02-13 DIAGNOSIS — Z96641 Presence of right artificial hip joint: Secondary | ICD-10-CM | POA: Diagnosis not present

## 2015-02-13 DIAGNOSIS — M9701XA Periprosthetic fracture around internal prosthetic right hip joint, initial encounter: Secondary | ICD-10-CM

## 2015-02-13 DIAGNOSIS — I482 Chronic atrial fibrillation, unspecified: Secondary | ICD-10-CM

## 2015-02-13 DIAGNOSIS — E46 Unspecified protein-calorie malnutrition: Secondary | ICD-10-CM

## 2015-02-13 DIAGNOSIS — E039 Hypothyroidism, unspecified: Secondary | ICD-10-CM

## 2015-02-13 DIAGNOSIS — A0472 Enterocolitis due to Clostridium difficile, not specified as recurrent: Secondary | ICD-10-CM

## 2015-02-13 DIAGNOSIS — T84040A Periprosthetic fracture around internal prosthetic right hip joint, initial encounter: Secondary | ICD-10-CM

## 2015-02-13 DIAGNOSIS — D62 Acute posthemorrhagic anemia: Secondary | ICD-10-CM | POA: Diagnosis not present

## 2015-02-13 DIAGNOSIS — E785 Hyperlipidemia, unspecified: Secondary | ICD-10-CM

## 2015-02-13 NOTE — Progress Notes (Signed)
Patient ID: Makayla Thomas, female   DOB: November 23, 1925, 80 y.o.   MRN: [id number]   02/13/2015  Facility:  Nursing Home Location:  [facility] Room Number: 902-P LEVEL OF CARE:  SNF ([id number])   Chief Complaint  Patient presents with  . Hospitalization Follow-up    Periprostatic right proximal femur S/P ORIF, atrial fibrillation, hypothyroidism, anemia, C. difficile, hyperlipidemia, constipation, hypertension and protein calorie malnutrition    HISTORY OF PRESENT ILLNESS:  This is an 81 year old female who has been admitted to [facility] on 02/12/15 from [facility]. She has PMH of recent right total hip arthroplasty 6/14, chronic atrial fibrillation on Xarelto, history of stroke, hypertension, hypothyroidism and hyperlipidemia. She had a right total hip arthroplasty on 6/14. She reports that she was dropped twice when moved from her bed to bedside commode. X-ray showed right hip fracture for which she had ORIF on 6/23.  She has been admitted for a short-term rehabilitation.  PAST MEDICAL HISTORY:  Past Medical History  Diagnosis Date  . Permanent atrial fibrillation   . Chronic anticoagulation     on Xarelto  . Hypothyroidism   . Dyslipidemia     treated  . PAD (peripheral artery disease) 08/30/2009    Dopplers; with bil. post. tib artery occlusion  . Lower extremity edema     chronic  . Aortic stenosis, mild 08/2009    ECHO  EF 55-60%, wth increased EDP, mild   . H/O cardiovascular stress test 05/2010    Lexiscan cardiolite no ischemia or infarction  . History of ETT 02/2011    Naughton exercise protocol, negative with poor exercise tolerance  . H/O multiple sclerosis     no excaerbations in some time  . Complication of anesthesia     hallusinations  . Dysrhythmia     A-FIB  . Visual disturbances     "FLASHING IN MY EYES"  . Arthritis   . Vaginal itching   . Constipation   . Anemia   . History of transfusion   . Borderline diabetes   .  Transient ischemic attack (TIA)     NO RESIDUAL PROBLEMS  . Diabetes mellitus without complication     possibly    CURRENT MEDICATIONS: Reviewed per MAR/see medication list  Allergies  Allergen Reactions  . Tetanus Antitoxin Anaphylaxis    Throat swelling  . Meclizine Other (See Comments)    Had funny feeling  . Tramadol Other (See Comments)    hallucinations     REVIEW OF SYSTEMS:  GENERAL: no change in appetite, no fatigue, no weight changes, no fever, chills or weakness RESPIRATORY: no cough, SOB, DOE, wheezing, hemoptysis CARDIAC: no chest pain, edema or palpitations GI: no abdominal pain, diarrhea, heart burn, nausea or vomiting, +constipation GU:  + dysuria   PHYSICAL EXAMINATION  GENERAL: no acute distress, obese SKIN:  Right hip (anterior) surgical site is healed and right (posterior) hip surgical site has staples, dry, no erythema EYES: conjunctivae normal, sclerae normal, normal eye lids NECK: supple, trachea midline, no neck masses, no thyroid tenderness, no thyromegaly LYMPHATICS: no LAN in the neck, no supraclavicular LAN RESPIRATORY: breathing is even & unlabored, BS CTAB CARDIAC: RRR, no murmur,no extra heart sounds, no edema GI: abdomen soft, normal BS, no masses, no tenderness, no hepatomegaly, no splenomegaly EXTREMITIES:  Able to move X 4 extremities PSYCHIATRIC: the patient is alert & oriented to person, affect & behavior appropriate  LABS/RADIOLOGY: Labs reviewed: Basic Metabolic Panel:  Recent  Labs  08/29/14 1528  08/31/14 0404  02/07/15 0455 02/11/15 0645 02/12/15 0515  NA  --   < > 142  < > 138 139 139  K  --   < > 4.2  < > 4.6 3.9 3.8  CL  --   < > 109  < > 101 105 103  CO2  --   < > 25  < > GLUCOSE  --   < > 117*  < > 111* 147* 104*  BUN  --   < > 55*  < > CREATININE  --   < > 1.15*  < > 0.66 0.69 0.58  CALCIUM  --   < > 8.8  < > 8.1* 7.5* 7.7*  MG 2.3  --  2.1  --   --   --   --   < > = values in this interval  not displayed. Liver Function Tests:  Recent Labs  08/29/14 1251 08/30/14 0336 02/05/15 0623  AST 133* 166* 20  ALT 92* 121* 17  ALKPHOS 104 96 94  BILITOT 1.2 1.3* 0.7  PROT 7.8 6.6 6.3*  ALBUMIN 4.7 4.0 3.1*    Recent Labs  08/29/14 1251  LIPASE 29    CBC:  Recent Labs  08/29/14 1251  02/05/15 0048  02/10/15 1044 02/11/15 0530 02/12/15 0515  WBC 11.1*  < > 12.5*  < > 11.6* 12.2* 8.8  NEUTROABS 7.4  --  9.8*  --  9.1*  --   --   HGB 16.1*  < > 11.4*  < > 7.9* 9.5* 9.3*  HCT 46.9*  < > 34.3*  < > 24.7* 28.3* 29.2*  MCV 90.0  < > 90.3  < > 92.9 89.3 90.7  PLT 310  < > 321  < > 191 222 276  < > = values in this interval not displayed.  Lipid Panel:  Recent Labs  09/01/14 0605  HDL 19*   Cardiac Enzymes:  Recent Labs  08/29/14 1251  TROPONINI <0.03   CBG:  Recent Labs  02/07/15 2255 02/08/15 0755 02/08/15 1422  GLUCAP 157* 119* 166*    Dg Chest 1 View  02/05/2015   CLINICAL DATA:  Patient with prior right hip replacement. Recent diagnosis right femur fracture.  EXAM: CHEST  1 VIEW  COMPARISON:  Chest radiograph 08/29/2014  FINDINGS: Monitoring leads overlie the patient. Stable enlarged cardiac and mediastinal contours with tortuosity and calcification of the thoracic aorta. Bilateral coarse interstitial opacities. No large consolidative pulmonary opacity. No pleural effusion or pneumothorax.  IMPRESSION: Cardiomegaly.  No acute cardiopulmonary process.   Electronically Signed   By: Annia Belt M.D.   On: 02/05/2015 01:57   Dg Pelvis 1-2 Views  02/05/2015   CLINICAL DATA:  Patient with recent proximal right femur fracture.  EXAM: PELVIS - 1-2 VIEW  COMPARISON:  None.  FINDINGS: Re- demonstrated comminuted proximal right femur fracture about right femoral hardware. Lower lumbar spine degenerative changes. SI joints are unremarkable. Pelvic phleboliths.  IMPRESSION: Re- demonstrated comminuted proximal right femur fracture about the hardware. Pelvic ring  remains intact.   Electronically Signed   By: Annia Belt M.D.   On: 02/05/2015 02:07   Dg Pelvis Portable  02/08/2015   CLINICAL DATA:  81 year old female status post right hip surgery. Initial encounter.  EXAM: PORTABLE PELVIS 1-2 VIEWS  COMPARISON:  02/05/2015. Right femur series from 1934 hours on 02/07/2015.  FINDINGS: 2 portable AP  views of the lower pelvis and right femur. Sequelae of right total hip arthroplasty. Right femoral implant has been revised since 02/05/2015. Longer right femoral stem is in place with re- identified periprosthetic longitudinal fracture tracking into the distal right femoral shaft (image 2).  Stable visualized bony pelvis. Visible left femur remarkable for partially visible left knee arthroplasty.  IMPRESSION: Right femoral shaft periprosthetic fracture Re-identified.   Electronically Signed   By: Odessa Fleming M.D.   On: 02/08/2015 08:58   Dg Chest Port 1 View  02/08/2015   CLINICAL DATA:  Productive cough and fever for days.  EXAM: PORTABLE CHEST - 1 VIEW  COMPARISON:  02/05/2015 and 08/29/2014  FINDINGS: Lungs are adequately inflated with mild left basilar opacification which may be due to atelectasis and small amount of pleural fluid versus infection. Mild stable cardiomegaly. Calcified plaque over the aortic arch. There are degenerative changes of the spine.  IMPRESSION: Mild left basilar opacification which may be due to atelectasis and small effusion, although cannot exclude infection in the left base.  Stable mild cardiomegaly.   Electronically Signed   By: Elberta Fortis M.D.   On: 02/08/2015 16:26   Dg C-arm 1-60 Min-no Report  01/29/2015   CLINICAL DATA: surg   C-ARM 1-60 MINUTES  Fluoroscopy was utilized by the requesting physician.  No radiographic  interpretation.    Dg Hip Port Unilat With Pelvis 1v Right  01/29/2015   CLINICAL DATA:  Status post right hip replacement today.  EXAM: RIGHT HIP (WITH PELVIS) 1 VIEW PORTABLE  COMPARISON:  None.  FINDINGS: Right total  hip arthroplasty is in place. The device is located and no fracture is identified. Gas in the soft tissues from surgery is noted.  IMPRESSION: Right total hip replacement without complicating feature.   Electronically Signed   By: Drusilla Kanner M.D.   On: 01/29/2015 11:03   Dg Hip Unilat With Pelvis 2-3 Views Right  02/07/2015   CLINICAL DATA:  Revision of right hip replacement.  EXAM: RIGHT HIP (WITH PELVIS) 2-3 VIEWS  COMPARISON:  Plain films the right hip 01/29/2015.  FINDINGS: Single intraoperative view of the right hip is identified. Surgical soft tissue wound is identified. The patient has a new long stem normal finding in place. Cerclage wires are identified scratch at 3 cerclage wires are present about the stem. There is a periprosthetic fracture off the tip of the stem.  IMPRESSION: Revision the femoral component of right hip replacement. Periprosthetic femur fracture is seen off the tip of the new long femoral stem.  Critical Value/emergent results were called by telephone at the time of interpretation on 02/07/2015 at 7:56 pm to Edythe Lynn, RN., the patient's nurse who verbally acknowledged these results.   Electronically Signed   By: Drusilla Kanner M.D.   On: 02/07/2015 19:58   Dg Femur, Min 2 Views Right  02/05/2015   CLINICAL DATA:  Patient with recent right hip replacement. Prior radiograph showing proximal right femur fracture.  EXAM: RIGHT FEMUR 2 VIEWS  COMPARISON:  01/29/2015  FINDINGS: Patient status post right hip arthroplasty. Comminuted fracture of the proximal right femur with associated foreshortening. Degenerative changes of the knee. Vascular calcifications. Pelvic phleboliths.  IMPRESSION: Comminuted proximal right femur fracture with associated foreshortening about the femoral stem of the right hip replacement.   Electronically Signed   By: Annia Belt M.D.   On: 02/05/2015 01:55    ASSESSMENT/PLAN:  Periprosthetic right proximal femur fracture S/P ORIF - for  rehabilitation;  continue Xarelto 15 mg by mouth daily for DVT prophylaxis; Zanaflex 4 mg 1 tab by mouth every 6 hours when necessary for muscle spasm; Ultram 50 mg 1 tab by mouth every 6 hours when necessary for pain; follow-up with Dr. Charlann Boxer, orthopedic surgeon  Chronic atrial fibrillation - rate-controlled; continue Xarelto 15 mg 1 tab PO Q D and Cardizem CD 120 mg 1 capsule PO Q D  Hypothyroidism - continue Synthroid 125 g 1 tab by mouth daily  Anemia, acute blood loss - S/P transfusion of 1 unit packed RBCs; hemoglobin 9.3; continue ferrous sulfate 325 mg 1 tab by mouth 3 times a day  C. difficile enteritis - continue Flagyl 500 mg 1 tab by mouth every 8 hours 16 days  Hyperlipidemia - continue Pravachol 40 mg 1 by mouth daily  Constipation - discontinue Colace; start senna S2 tabs by mouth twice a day and MiraLAX 17 g +4-6 ounces liquid by mouth twice a day  Hypertension - continue Maxzide 25 mg 1 tab by mouth every morning, Cardizem CD 120 mg by mouth daily and Zebeta 10 mg 1 tab by mouth daily; change Lasix 20 mg 1 tab by mouth daily when necessary; BP/HR Q shift X 1 week  Protein calorie malnutrition - albumin 3.1; continue supplementation    Goals of care:  Short-term rehabilitation    Labs/test ordered:  CBC, BMP, TSH and UA w/ CS   Spent 50 minutes in patient care.    [facility], NP [employer] [phone]

## 2015-02-15 ENCOUNTER — Non-Acute Institutional Stay (SKILLED_NURSING_FACILITY): Payer: Medicare Other | Admitting: Internal Medicine

## 2015-02-15 DIAGNOSIS — A0472 Enterocolitis due to Clostridium difficile, not specified as recurrent: Secondary | ICD-10-CM

## 2015-02-15 DIAGNOSIS — I482 Chronic atrial fibrillation, unspecified: Secondary | ICD-10-CM

## 2015-02-15 DIAGNOSIS — I10 Essential (primary) hypertension: Secondary | ICD-10-CM | POA: Diagnosis not present

## 2015-02-15 DIAGNOSIS — D62 Acute posthemorrhagic anemia: Secondary | ICD-10-CM | POA: Diagnosis not present

## 2015-02-15 DIAGNOSIS — S7291XS Unspecified fracture of right femur, sequela: Secondary | ICD-10-CM

## 2015-02-15 DIAGNOSIS — E039 Hypothyroidism, unspecified: Secondary | ICD-10-CM

## 2015-02-15 DIAGNOSIS — A047 Enterocolitis due to Clostridium difficile: Secondary | ICD-10-CM | POA: Diagnosis not present

## 2015-02-15 DIAGNOSIS — E785 Hyperlipidemia, unspecified: Secondary | ICD-10-CM | POA: Diagnosis not present

## 2015-02-15 DIAGNOSIS — R42 Dizziness and giddiness: Secondary | ICD-10-CM

## 2015-02-15 NOTE — Progress Notes (Signed)
Patient ID: Makayla Thomas, female   DOB: 1926-05-07, 81 y.o.   MRN: [id number]     [facility]   PCP: Londell Moh, MD  Code Status: full code  Allergies  Allergen Reactions  . Tetanus Antitoxin Anaphylaxis    Throat swelling  . Meclizine Other (See Comments)    Had funny feeling  . Tramadol Other (See Comments)    hallucinations    Chief Complaint  Patient presents with  . New Admit To SNF     HPI:  81 year old patient is here for short term rehabilitation post hospital admission from 02/04/15-02/12/15 with periprosthetic fracture around internal prosthetic right hip joint. She underwent open reduction and internal fixation. Her hospital course was complicated with c.diff colitis. She was started on flagyl. She is seen in her room today. She is on contact precautions. She has PMH of afib, CVA, HTN, HLD, hypothyroidism. She complaints of some nausea this am with abdominal discomfort. She also complaints of dizziness with change of position.   Review of Systems:  Constitutional: Negative for fever, chills, diaphoresis. positive for malaise HENT: Negative for headache, congestion, nasal discharge Eyes: Negative for eye pain, blurred vision, double vision and discharge.  Respiratory: Negative for cough, shortness of breath and wheezing.   Cardiovascular: Negative for chest pain, palpitations, leg swelling.  Gastrointestinal: Negative for heartburn, vomiting. Has loose stool. Genitourinary: Negative for dysuria Musculoskeletal: Negative for back pain, falls in facility. Her hip pain is under control with current pain regimen Skin: Negative for itching, rash.  Neurological: Negative for tingling, focal weakness Psychiatric/Behavioral: Negative for depression.    Past Medical History  Diagnosis Date  . Permanent atrial fibrillation   . Chronic anticoagulation     on Xarelto  . Hypothyroidism   . Dyslipidemia     treated  . PAD  (peripheral artery disease) 08/30/2009    Dopplers; with bil. post. tib artery occlusion  . Lower extremity edema     chronic  . Aortic stenosis, mild 08/2009    ECHO  EF 55-60%, wth increased EDP, mild   . H/O cardiovascular stress test 05/2010    Lexiscan cardiolite no ischemia or infarction  . History of ETT 02/2011    Naughton exercise protocol, negative with poor exercise tolerance  . H/O multiple sclerosis     no excaerbations in some time  . Complication of anesthesia     hallusinations  . Dysrhythmia     A-FIB  . Visual disturbances     "FLASHING IN MY EYES"  . Arthritis   . Vaginal itching   . Constipation   . Anemia   . History of transfusion   . Borderline diabetes   . Transient ischemic attack (TIA)     NO RESIDUAL PROBLEMS  . Diabetes mellitus without complication     possibly   Past Surgical History  Procedure Laterality Date  . Cesarean section      hx of 3  . Throidectomy partial      lt  . Cholecystectomy    . Breast surgery      L tumor removed - benign  . Joint replacement  2012    left total knee  . Total hip arthroplasty Right 01/29/2015    Procedure: RIGHT TOTAL HIP ARTHROPLASTY ANTERIOR APPROACH;  Surgeon: Durene Romans, MD;  Location: [facility];  Service: Orthopedics;  Laterality: Right;  . Orif periprosthetic fracture Right 02/07/2015    Procedure: OPEN REDUCTION INTERNAL FIXATION (ORIF)  PERIPROSTHETIC FRACTURE WITH TOTAL HIP REVISION AND CABLES;  Surgeon: Durene Romans, MD;  Location: [facility];  Service: Orthopedics;  Laterality: Right;   Social History:   reports that she has never smoked. She has never used smokeless tobacco. She reports that she drinks alcohol. She reports that she does not use illicit drugs.  Family History  Problem Relation Age of Onset  . Heart failure Father 27    Medications: Patient's Medications  New Prescriptions   No medications on file  Previous Medications   ACETAMINOPHEN (TYLENOL) 325 MG TABLET    Take 1-2 tablets  (325-650 mg total) by mouth every 6 (six) hours as needed for moderate pain.   BISOPROLOL (ZEBETA) 10 MG TABLET    Take 1 tablet (10 mg total) by mouth daily.   CHOLECALCIFEROL (VITAMIN D) 1000 UNITS TABLET    Take 1,000 Units by mouth every evening.    DILTIAZEM (CARDIZEM CD) 120 MG 24 HR CAPSULE    Take 120 mg by mouth daily.   DOCUSATE SODIUM (COLACE) 100 MG CAPSULE    Take 1 capsule (100 mg total) by mouth 2 (two) times daily.   FEEDING SUPPLEMENT, ENSURE ENLIVE, (ENSURE ENLIVE) LIQD    Take 237 mLs by mouth 2 (two) times daily between meals.   FERROUS SULFATE 325 (65 FE) MG TABLET    Take 1 tablet (325 mg total) by mouth 3 (three) times daily after meals.   FUROSEMIDE (LASIX) 20 MG TABLET    Take 20 mg by mouth every morning.   METRONIDAZOLE (FLAGYL) 500 MG TABLET    Take 1 tablet (500 mg total) by mouth every 8 (eight) hours.   ONDANSETRON (ZOFRAN) 4 MG TABLET    Take 1 tablet (4 mg total) by mouth every 6 (six) hours as needed for nausea.   PRAVASTATIN (PRAVACHOL) 40 MG TABLET    Take 40 mg by mouth daily.   RIVAROXABAN (XARELTO) 15 MG TABS TABLET    Take 1 tablet (15 mg total) by mouth daily with breakfast.   SYNTHROID 125 MCG TABLET    Take 125 mcg by mouth daily.   TIZANIDINE (ZANAFLEX) 4 MG TABLET    Take 1 tablet (4 mg total) by mouth every 6 (six) hours as needed for muscle spasms.   TRIAMTERENE-HYDROCHLOROTHIAZIDE (MAXZIDE-25) 37.5-25 MG PER TABLET    Take 1 tablet by mouth every morning.  Modified Medications   No medications on file  Discontinued Medications   No medications on file     Physical Exam: Filed Vitals:   02/15/15 1658  BP: 112/70  Pulse: 68  Temp: 99.2 F (37.3 C)  Resp: 19  Weight: 185 lb 6.4 oz (84.097 kg)  SpO2: 92%    General- elderly female, in no acute distress Head- normocephalic, atraumatic Nose- no maxillary or frontal sinus tenderness, no nasal discharge Throat- moist mucus membrane  Eyes- PERRLA, EOMI, no pallor, no icterus, no discharge,  normal conjunctiva, normal sclera Neck- no cervical lymphadenopathy Cardiovascular- normal s1,s2, no murmurs, palpable dorsalis pedis and radial pulses, trace right leg edema Respiratory- bilateral clear to auscultation, no wheeze, no rhonchi, no crackles, no use of accessory muscles Abdomen- bowel sounds present, soft, generalized tenderness on palpation, no guarding or rigidity Musculoskeletal- able to move all 4 extremities, right leg range of motion limited  Neurological- no focal deficit Skin- warm and dry, surgical incision on right hip with staples in place and healing well, some bruising noted Psychiatry- alert and oriented to person, place and  time, normal mood and affect    Labs reviewed: Basic Metabolic Panel:  Recent Labs  16/10/96 1528  08/31/14 0404  02/07/15 0455 02/11/15 0645 02/12/15 0515  NA  --   < > 142  < > 138 139 139  K  --   < > 4.2  < > 4.6 3.9 3.8  CL  --   < > 109  < > 101 105 103  CO2  --   < > 25  < > 28 26 28   GLUCOSE  --   < > 117*  < > 111* 147* 104*  BUN  --   < > 55*  < > 18 16 11   CREATININE  --   < > 1.15*  < > 0.66 0.69 0.58  CALCIUM  --   < > 8.8  < > 8.1* 7.5* 7.7*  MG 2.3  --  2.1  --   --   --   --   < > = values in this interval not displayed. Liver Function Tests:  Recent Labs  08/29/14 1251 08/30/14 0336 02/05/15 0623  AST 133* 166* 20  ALT 92* 121* 17  ALKPHOS 104 96 94  BILITOT 1.2 1.3* 0.7  PROT 7.8 6.6 6.3*  ALBUMIN 4.7 4.0 3.1*    Recent Labs  08/29/14 1251  LIPASE 29   No results for input(s): AMMONIA in the last 8760 hours. CBC:  Recent Labs  08/29/14 1251  02/05/15 0048  02/10/15 1044 02/11/15 0530 02/12/15 0515  WBC 11.1*  < > 12.5*  < > 11.6* 12.2* 8.8  NEUTROABS 7.4  --  9.8*  --  9.1*  --   --   HGB 16.1*  < > 11.4*  < > 7.9* 9.5* 9.3*  HCT 46.9*  < > 34.3*  < > 24.7* 28.3* 29.2*  MCV 90.0  < > 90.3  < > 92.9 89.3 90.7  PLT 310  < > 321  < > 191 222 276  < > = values in this interval not  displayed. Cardiac Enzymes:  Recent Labs  08/29/14 1251  TROPONINI <0.03   BNP: Invalid input(s): POCBNP CBG:  Recent Labs  02/07/15 2255 02/08/15 0755 02/08/15 1422  GLUCAP 157* 119* 166*   02/14/15 wbc 8.7, hb 10.2, hct 31.1, plt 415, na 142, k 4, bun 16, cr 0.65, cl 102, co2 26, glu 111, tsh 18.253 Radiological Exams:  Dg Chest 1 View  02/05/2015   CLINICAL DATA:  Patient with prior right hip replacement. Recent diagnosis right femur fracture.  EXAM: CHEST  1 VIEW  COMPARISON:  Chest radiograph 08/29/2014  FINDINGS: Monitoring leads overlie the patient. Stable enlarged cardiac and mediastinal contours with tortuosity and calcification of the thoracic aorta. Bilateral coarse interstitial opacities. No large consolidative pulmonary opacity. No pleural effusion or pneumothorax.  IMPRESSION: Cardiomegaly.  No acute cardiopulmonary process.   Electronically Signed   By: Annia Belt M.D.   On: 02/05/2015 01:57   Dg Pelvis 1-2 Views  02/05/2015   CLINICAL DATA:  Patient with recent proximal right femur fracture.  EXAM: PELVIS - 1-2 VIEW  COMPARISON:  None.  FINDINGS: Re- demonstrated comminuted proximal right femur fracture about right femoral hardware. Lower lumbar spine degenerative changes. SI joints are unremarkable. Pelvic phleboliths.  IMPRESSION: Re- demonstrated comminuted proximal right femur fracture about the hardware. Pelvic ring remains intact.   Electronically Signed   By: Annia Belt M.D.   On: 02/05/2015 02:07   Dg Pelvis  Portable  02/08/2015   CLINICAL DATA:  81 year old female status post right hip surgery. Initial encounter.  EXAM: PORTABLE PELVIS 1-2 VIEWS  COMPARISON:  02/05/2015. Right femur series from 1934 hours on 02/07/2015.  FINDINGS: 2 portable AP views of the lower pelvis and right femur. Sequelae of right total hip arthroplasty. Right femoral implant has been revised since 02/05/2015. Longer right femoral stem is in place with re- identified periprosthetic  longitudinal fracture tracking into the distal right femoral shaft (image 2).  Stable visualized bony pelvis. Visible left femur remarkable for partially visible left knee arthroplasty.  IMPRESSION: Right femoral shaft periprosthetic fracture Re-identified.   Electronically Signed   By: Odessa Fleming M.D.   On: 02/08/2015 08:58   Dg Chest Port 1 View  02/08/2015   CLINICAL DATA:  Productive cough and fever for days.  EXAM: PORTABLE CHEST - 1 VIEW  COMPARISON:  02/05/2015 and 08/29/2014  FINDINGS: Lungs are adequately inflated with mild left basilar opacification which may be due to atelectasis and small amount of pleural fluid versus infection. Mild stable cardiomegaly. Calcified plaque over the aortic arch. There are degenerative changes of the spine.  IMPRESSION: Mild left basilar opacification which may be due to atelectasis and small effusion, although cannot exclude infection in the left base.  Stable mild cardiomegaly.   Electronically Signed   By: Elberta Fortis M.D.   On: 02/08/2015 16:26   Dg C-arm 1-60 Min-no Report  01/29/2015   CLINICAL DATA: surg   C-ARM 1-60 MINUTES  Fluoroscopy was utilized by the requesting physician.  No radiographic  interpretation.    Dg Hip Port Unilat With Pelvis 1v Right  01/29/2015   CLINICAL DATA:  Status post right hip replacement today.  EXAM: RIGHT HIP (WITH PELVIS) 1 VIEW PORTABLE  COMPARISON:  None.  FINDINGS: Right total hip arthroplasty is in place. The device is located and no fracture is identified. Gas in the soft tissues from surgery is noted.  IMPRESSION: Right total hip replacement without complicating feature.   Electronically Signed   By: Drusilla Kanner M.D.   On: 01/29/2015 11:03   Dg Hip Unilat With Pelvis 2-3 Views Right  02/07/2015   CLINICAL DATA:  Revision of right hip replacement.  EXAM: RIGHT HIP (WITH PELVIS) 2-3 VIEWS  COMPARISON:  Plain films the right hip 01/29/2015.  FINDINGS: Single intraoperative view of the right hip is identified.  Surgical soft tissue wound is identified. The patient has a new long stem normal finding in place. Cerclage wires are identified scratch at 3 cerclage wires are present about the stem. There is a periprosthetic fracture off the tip of the stem.  IMPRESSION: Revision the femoral component of right hip replacement. Periprosthetic femur fracture is seen off the tip of the new long femoral stem.  Critical Value/emergent results were called by telephone at the time of interpretation on 02/07/2015 at 7:56 pm to Edythe Lynn, RN., the patient's nurse who verbally acknowledged these results.   Electronically Signed   By: Drusilla Kanner M.D.   On: 02/07/2015 19:58   Dg Femur, Min 2 Views Right  02/05/2015   CLINICAL DATA:  Patient with recent right hip replacement. Prior radiograph showing proximal right femur fracture.  EXAM: RIGHT FEMUR 2 VIEWS  COMPARISON:  01/29/2015  FINDINGS: Patient status post right hip arthroplasty. Comminuted fracture of the proximal right femur with associated foreshortening. Degenerative changes of the knee. Vascular calcifications. Pelvic phleboliths.  IMPRESSION: Comminuted proximal right femur fracture with associated foreshortening  about the femoral stem of the right hip replacement.   Electronically Signed   By: Annia Belt M.D.   On: 02/05/2015 01:55    Assessment/Plan  right proximal femur fracture  Periprosthetic fracture S/P ORIF. Continue ultram 50 mg q6h prn pain with zanaflex 4 mg q6h prn muscle spasm. Continue xarelto for dvt prophylaxis. Has follow up with orthopedic surgeon. Will have her work with physical therapy and occupational therapy team to help with gait training and muscle strengthening exercises.fall precautions. Skin care. Encourage to be out of bed.   C.diff colitis Complaints of abdominal pain with some nausea, afebrile, exam finding not suggestive of acute abdomen. no blood in stool. Continue flagyl 500 mg tid for total 2 weeks, contact precautions and  monitor clinically. Check cbc with diff and bmp. Hydration encouraged  Hypothyroidism Reviewed tsh. Increase synthroid to 150 mcg daily for now and check tsh in 4 weeks  Dizziness With change of position, reviewed bp reading. Check orthostatics daily for now. D/c maxzide with her diarrhea and dizziness for now. Monitor bp q shift.  Blood loss anemia Post op, s/p 1 u prbc transfusion. Stable h&h in recent lab with hb 10.2. Continue ferrous sulfate 325 mg tid.  Chronic atrial fibrillation Rate controlled. Continue cardizem cd 120 mg daily and bisoprolol 10 mg daily with xarelto 15 mg daily and monitor  Hyperlipidemia continue Pravachol 40 mg daily  Hypertension Stable bp reading but with dizziness, dc maxzide for now. Continue cardizem cd and bisoprolol.    Goals of care:  Short-term rehabilitation   Labs/tests ordered: tsh   Family/ staff Communication: reviewed care plan with patient and nursing supervisor    Oneal Grout, MD  [facility] [phone] (Monday-Friday 8 am - 5 pm) [phone] (afterhours)

## 2015-02-28 DIAGNOSIS — Z96641 Presence of right artificial hip joint: Secondary | ICD-10-CM | POA: Diagnosis not present

## 2015-02-28 DIAGNOSIS — Z471 Aftercare following joint replacement surgery: Secondary | ICD-10-CM | POA: Diagnosis not present

## 2015-03-12 ENCOUNTER — Non-Acute Institutional Stay (SKILLED_NURSING_FACILITY): Payer: Medicare Other | Admitting: Adult Health

## 2015-03-12 ENCOUNTER — Encounter: Payer: Self-pay | Admitting: Adult Health

## 2015-03-12 DIAGNOSIS — T84040A Periprosthetic fracture around internal prosthetic right hip joint, initial encounter: Secondary | ICD-10-CM | POA: Diagnosis not present

## 2015-03-12 DIAGNOSIS — K219 Gastro-esophageal reflux disease without esophagitis: Secondary | ICD-10-CM | POA: Diagnosis not present

## 2015-03-12 DIAGNOSIS — E46 Unspecified protein-calorie malnutrition: Secondary | ICD-10-CM | POA: Diagnosis not present

## 2015-03-12 DIAGNOSIS — Z96641 Presence of right artificial hip joint: Secondary | ICD-10-CM | POA: Diagnosis not present

## 2015-03-12 DIAGNOSIS — E039 Hypothyroidism, unspecified: Secondary | ICD-10-CM

## 2015-03-12 DIAGNOSIS — I482 Chronic atrial fibrillation, unspecified: Secondary | ICD-10-CM

## 2015-03-12 DIAGNOSIS — K5900 Constipation, unspecified: Secondary | ICD-10-CM | POA: Diagnosis not present

## 2015-03-12 DIAGNOSIS — I10 Essential (primary) hypertension: Secondary | ICD-10-CM

## 2015-03-12 DIAGNOSIS — E785 Hyperlipidemia, unspecified: Secondary | ICD-10-CM

## 2015-03-12 DIAGNOSIS — M9701XA Periprosthetic fracture around internal prosthetic right hip joint, initial encounter: Secondary | ICD-10-CM

## 2015-03-12 NOTE — Progress Notes (Signed)
Patient ID: Makayla Thomas, female   DOB: 04-25-1926, 81 y.o.   MRN: [id number]   03/12/2015  Facility:  Nursing Home Location:  [facility] Room Number: 902-P LEVEL OF CARE:  SNF (31)   Chief Complaint  Patient presents with  . Discharge Note    Periprostatic right proximal femur S/P ORIF, atrial fibrillation, hypothyroidism, anemia, hyperlipidemia, constipation, hypertension and protein calorie malnutrition    HISTORY OF PRESENT ILLNESS:  This is an 81 year old female who is for discharge with Home health PT. She has been admitted to [facility] on 02/12/15 from [facility]. She has PMH of recent right total hip arthroplasty 6/14, chronic atrial fibrillation on Xarelto, history of stroke, hypertension, hypothyroidism and hyperlipidemia. She had a right total hip arthroplasty on 6/14. She reports that she was dropped twice when moved from her bed to bedside commode. X-ray showed right hip fracture for which she had ORIF on 6/23.  Patient was admitted to this facility for short-term rehabilitation after the patient's recent hospitalization.  Patient has completed SNF rehabilitation and therapy has cleared the patient for discharge.  PAST MEDICAL HISTORY:  Past Medical History  Diagnosis Date  . Permanent atrial fibrillation   . Chronic anticoagulation     on Xarelto  . Hypothyroidism   . Dyslipidemia     treated  . PAD (peripheral artery disease) 08/30/2009    Dopplers; with bil. post. tib artery occlusion  . Lower extremity edema     chronic  . Aortic stenosis, mild 08/2009    ECHO  EF 55-60%, wth increased EDP, mild   . H/O cardiovascular stress test 05/2010    Lexiscan cardiolite no ischemia or infarction  . History of ETT 02/2011    Naughton exercise protocol, negative with poor exercise tolerance  . H/O multiple sclerosis     no excaerbations in some time  . Complication of anesthesia     hallusinations  . Dysrhythmia     A-FIB  .  Visual disturbances     "FLASHING IN MY EYES"  . Arthritis   . Vaginal itching   . Constipation   . Anemia   . History of transfusion   . Borderline diabetes   . Transient ischemic attack (TIA)     NO RESIDUAL PROBLEMS  . Diabetes mellitus without complication     possibly    CURRENT MEDICATIONS: Reviewed per MAR/see medication list  Allergies  Allergen Reactions  . Tetanus Antitoxin Anaphylaxis    Throat swelling  . Meclizine Other (See Comments)    Had funny feeling  . Tramadol Other (See Comments)    hallucinations     REVIEW OF SYSTEMS:  GENERAL: no change in appetite, no fatigue, no weight changes, no fever, chills or weakness RESPIRATORY: no cough, SOB, DOE, wheezing, hemoptysis CARDIAC: no chest pain, edema or palpitations GI: no abdominal pain, diarrhea, heart burn, nausea or vomiting, +constipation   PHYSICAL EXAMINATION  GENERAL: no acute distress, obese SKIN:  Right hip (anterior) surgical site is healed and right (posterior) hip surgical site is dry, no erythema EYES: conjunctivae normal, sclerae normal, normal eye lids NECK: supple, trachea midline, no neck masses, no thyroid tenderness, no thyromegaly LYMPHATICS: no LAN in the neck, no supraclavicular LAN RESPIRATORY: breathing is even & unlabored, BS CTAB CARDIAC: RRR, no murmur,no extra heart sounds, no edema GI: abdomen soft, normal BS, no masses, no tenderness, no hepatomegaly, no splenomegaly EXTREMITIES:  Able to move X 4 extremities PSYCHIATRIC:  the patient is alert & oriented to person, affect & behavior appropriate  LABS/RADIOLOGY: Labs reviewed: 02/14/15  WBC 8.7 hemoglobin 10.2 hematocrit 31.1 MCV 88.6 platelet 415 sodium 142 potassium 4.0 glucose 111 BUN 16 creatinine 0.65 calcium 8.0 TSH 18.253 Basic Metabolic Panel:  Recent Labs  16/10/96 1528  08/31/14 0404  02/07/15 0455 02/11/15 0645 02/12/15 0515  NA  --   < > 142  < > 138 139 139  K  --   < > 4.2  < > 4.6 3.9 3.8  CL  --    < > 109  < > 101 105 103  CO2  --   < > 25  < > GLUCOSE  --   < > 117*  < > 111* 147* 104*  BUN  --   < > 55*  < > CREATININE  --   < > 1.15*  < > 0.66 0.69 0.58  CALCIUM  --   < > 8.8  < > 8.1* 7.5* 7.7*  MG 2.3  --  2.1  --   --   --   --   < > = values in this interval not displayed. Liver Function Tests:  Recent Labs  08/29/14 1251 08/30/14 0336 02/05/15 0623  AST 133* 166* 20  ALT 92* 121* 17  ALKPHOS 104 96 94  BILITOT 1.2 1.3* 0.7  PROT 7.8 6.6 6.3*  ALBUMIN 4.7 4.0 3.1*    Recent Labs  08/29/14 1251  LIPASE 29    CBC:  Recent Labs  08/29/14 1251  02/05/15 0048  02/10/15 1044 02/11/15 0530 02/12/15 0515  WBC 11.1*  < > 12.5*  < > 11.6* 12.2* 8.8  NEUTROABS 7.4  --  9.8*  --  9.1*  --   --   HGB 16.1*  < > 11.4*  < > 7.9* 9.5* 9.3*  HCT 46.9*  < > 34.3*  < > 24.7* 28.3* 29.2*  MCV 90.0  < > 90.3  < > 92.9 89.3 90.7  PLT 310  < > 321  < > 191 222 276  < > = values in this interval not displayed.  Lipid Panel:  Recent Labs  09/01/14 0605  HDL 19*   Cardiac Enzymes:  Recent Labs  08/29/14 1251  TROPONINI <0.03   CBG:  Recent Labs  02/07/15 2255 02/08/15 0755 02/08/15 1422  GLUCAP 157* 119* 166*    ASSESSMENT/PLAN:  Periprosthetic right proximal femur fracture S/P ORIF - for home health PT; continue Xarelto 15 mg by mouth daily for DVT prophylaxis; Zanaflex 4 mg 1 tab by mouth every 6 hours when necessary for muscle spasm; Ultram 50 mg 1 tab by mouth every 6 hours when necessary for pain; follow-up with Dr. Charlann Boxer, orthopedic surgeon  Chronic atrial fibrillation - rate-controlled; continue Xarelto 15 mg 1 tab PO Q D and Cardizem CD 120 mg 1 capsule PO Q D  Hypothyroidism - tsh; 18.253; recently increased dosage of Synthroid  To 150 g 1 tab by mouth daily  Anemia, acute blood loss - S/P transfusion of 1 unit packed RBCs; hemoglobin 10.2; continue ferrous sulfate 325 mg 1 tab by mouth 3 times a day  Hyperlipidemia -  continue Lipitor 10 mg 1 by mouth daily  Constipation - continue Senna-S 1 tab PO Q HS  Hypertension - continue Cardizem CD 120 mg by mouth daily and Zebeta 10 mg 1 tab by mouth daily; change Lasix 20 mg  1 tab by mouth daily when necessary  Protein calorie malnutrition - albumin 3.1; continue supplementation  GERD - recently started on Prilosec 20 mg 1 capsule PO Q D    I have filled out patient's discharge paperwork and written prescriptions.  Patient will receive home health PT.  Total discharge time: Less than 30 minutes  Discharge time involved coordination of the discharge process with Child psychotherapist, nursing staff and therapy department. Medical justification for home health services verified.    [facility], NP [employer] [phone]

## 2015-03-19 DIAGNOSIS — T84040D Periprosthetic fracture around internal prosthetic right hip joint, subsequent encounter: Secondary | ICD-10-CM | POA: Diagnosis not present

## 2015-03-19 DIAGNOSIS — I482 Chronic atrial fibrillation: Secondary | ICD-10-CM | POA: Diagnosis not present

## 2015-03-19 DIAGNOSIS — G35 Multiple sclerosis: Secondary | ICD-10-CM | POA: Diagnosis not present

## 2015-03-19 DIAGNOSIS — M1990 Unspecified osteoarthritis, unspecified site: Secondary | ICD-10-CM | POA: Diagnosis not present

## 2015-03-19 DIAGNOSIS — I739 Peripheral vascular disease, unspecified: Secondary | ICD-10-CM | POA: Diagnosis not present

## 2015-03-19 DIAGNOSIS — I10 Essential (primary) hypertension: Secondary | ICD-10-CM | POA: Diagnosis not present

## 2015-03-20 DIAGNOSIS — M1990 Unspecified osteoarthritis, unspecified site: Secondary | ICD-10-CM | POA: Diagnosis not present

## 2015-03-20 DIAGNOSIS — I482 Chronic atrial fibrillation: Secondary | ICD-10-CM | POA: Diagnosis not present

## 2015-03-20 DIAGNOSIS — I10 Essential (primary) hypertension: Secondary | ICD-10-CM | POA: Diagnosis not present

## 2015-03-20 DIAGNOSIS — T84040D Periprosthetic fracture around internal prosthetic right hip joint, subsequent encounter: Secondary | ICD-10-CM | POA: Diagnosis not present

## 2015-03-20 DIAGNOSIS — I739 Peripheral vascular disease, unspecified: Secondary | ICD-10-CM | POA: Diagnosis not present

## 2015-03-20 DIAGNOSIS — G35 Multiple sclerosis: Secondary | ICD-10-CM | POA: Diagnosis not present

## 2015-03-21 DIAGNOSIS — M1990 Unspecified osteoarthritis, unspecified site: Secondary | ICD-10-CM | POA: Diagnosis not present

## 2015-03-21 DIAGNOSIS — G35 Multiple sclerosis: Secondary | ICD-10-CM | POA: Diagnosis not present

## 2015-03-21 DIAGNOSIS — I739 Peripheral vascular disease, unspecified: Secondary | ICD-10-CM | POA: Diagnosis not present

## 2015-03-21 DIAGNOSIS — I482 Chronic atrial fibrillation: Secondary | ICD-10-CM | POA: Diagnosis not present

## 2015-03-21 DIAGNOSIS — T84040D Periprosthetic fracture around internal prosthetic right hip joint, subsequent encounter: Secondary | ICD-10-CM | POA: Diagnosis not present

## 2015-03-21 DIAGNOSIS — I10 Essential (primary) hypertension: Secondary | ICD-10-CM | POA: Diagnosis not present

## 2015-03-26 DIAGNOSIS — I739 Peripheral vascular disease, unspecified: Secondary | ICD-10-CM | POA: Diagnosis not present

## 2015-03-26 DIAGNOSIS — I482 Chronic atrial fibrillation: Secondary | ICD-10-CM | POA: Diagnosis not present

## 2015-03-26 DIAGNOSIS — G35 Multiple sclerosis: Secondary | ICD-10-CM | POA: Diagnosis not present

## 2015-03-26 DIAGNOSIS — I10 Essential (primary) hypertension: Secondary | ICD-10-CM | POA: Diagnosis not present

## 2015-03-26 DIAGNOSIS — T84040D Periprosthetic fracture around internal prosthetic right hip joint, subsequent encounter: Secondary | ICD-10-CM | POA: Diagnosis not present

## 2015-03-26 DIAGNOSIS — M1990 Unspecified osteoarthritis, unspecified site: Secondary | ICD-10-CM | POA: Diagnosis not present

## 2015-03-27 DIAGNOSIS — I739 Peripheral vascular disease, unspecified: Secondary | ICD-10-CM | POA: Diagnosis not present

## 2015-03-27 DIAGNOSIS — I482 Chronic atrial fibrillation: Secondary | ICD-10-CM | POA: Diagnosis not present

## 2015-03-27 DIAGNOSIS — M1990 Unspecified osteoarthritis, unspecified site: Secondary | ICD-10-CM | POA: Diagnosis not present

## 2015-03-27 DIAGNOSIS — T84040D Periprosthetic fracture around internal prosthetic right hip joint, subsequent encounter: Secondary | ICD-10-CM | POA: Diagnosis not present

## 2015-03-27 DIAGNOSIS — I10 Essential (primary) hypertension: Secondary | ICD-10-CM | POA: Diagnosis not present

## 2015-03-27 DIAGNOSIS — G35 Multiple sclerosis: Secondary | ICD-10-CM | POA: Diagnosis not present

## 2015-03-28 DIAGNOSIS — Z471 Aftercare following joint replacement surgery: Secondary | ICD-10-CM | POA: Diagnosis not present

## 2015-03-28 DIAGNOSIS — Z96641 Presence of right artificial hip joint: Secondary | ICD-10-CM | POA: Diagnosis not present

## 2015-03-28 DIAGNOSIS — T84049D Periprosthetic fracture around unspecified internal prosthetic joint, subsequent encounter: Secondary | ICD-10-CM | POA: Diagnosis not present

## 2015-03-29 DIAGNOSIS — I10 Essential (primary) hypertension: Secondary | ICD-10-CM | POA: Diagnosis not present

## 2015-03-29 DIAGNOSIS — I482 Chronic atrial fibrillation: Secondary | ICD-10-CM | POA: Diagnosis not present

## 2015-03-29 DIAGNOSIS — M1990 Unspecified osteoarthritis, unspecified site: Secondary | ICD-10-CM | POA: Diagnosis not present

## 2015-03-29 DIAGNOSIS — G35 Multiple sclerosis: Secondary | ICD-10-CM | POA: Diagnosis not present

## 2015-03-29 DIAGNOSIS — T84040D Periprosthetic fracture around internal prosthetic right hip joint, subsequent encounter: Secondary | ICD-10-CM | POA: Diagnosis not present

## 2015-03-29 DIAGNOSIS — I739 Peripheral vascular disease, unspecified: Secondary | ICD-10-CM | POA: Diagnosis not present

## 2015-05-10 DIAGNOSIS — T84040D Periprosthetic fracture around internal prosthetic right hip joint, subsequent encounter: Secondary | ICD-10-CM | POA: Diagnosis not present

## 2015-05-10 DIAGNOSIS — Z471 Aftercare following joint replacement surgery: Secondary | ICD-10-CM | POA: Diagnosis not present

## 2015-05-27 DIAGNOSIS — Z23 Encounter for immunization: Secondary | ICD-10-CM | POA: Diagnosis not present

## 2015-06-14 DIAGNOSIS — E1129 Type 2 diabetes mellitus with other diabetic kidney complication: Secondary | ICD-10-CM | POA: Diagnosis not present

## 2015-06-14 DIAGNOSIS — E785 Hyperlipidemia, unspecified: Secondary | ICD-10-CM | POA: Diagnosis not present

## 2015-06-14 DIAGNOSIS — I10 Essential (primary) hypertension: Secondary | ICD-10-CM | POA: Diagnosis not present

## 2015-06-14 DIAGNOSIS — E559 Vitamin D deficiency, unspecified: Secondary | ICD-10-CM | POA: Diagnosis not present

## 2015-06-14 DIAGNOSIS — Z0000 Encounter for general adult medical examination without abnormal findings: Secondary | ICD-10-CM | POA: Diagnosis not present

## 2015-06-14 DIAGNOSIS — E039 Hypothyroidism, unspecified: Secondary | ICD-10-CM | POA: Diagnosis not present

## 2015-06-21 DIAGNOSIS — I10 Essential (primary) hypertension: Secondary | ICD-10-CM | POA: Diagnosis not present

## 2015-06-21 DIAGNOSIS — E1129 Type 2 diabetes mellitus with other diabetic kidney complication: Secondary | ICD-10-CM | POA: Diagnosis not present

## 2015-06-21 DIAGNOSIS — R7303 Prediabetes: Secondary | ICD-10-CM | POA: Diagnosis not present

## 2015-06-21 DIAGNOSIS — E785 Hyperlipidemia, unspecified: Secondary | ICD-10-CM | POA: Diagnosis not present

## 2015-07-02 DIAGNOSIS — M810 Age-related osteoporosis without current pathological fracture: Secondary | ICD-10-CM | POA: Diagnosis not present

## 2015-07-04 DIAGNOSIS — M810 Age-related osteoporosis without current pathological fracture: Secondary | ICD-10-CM | POA: Diagnosis not present

## 2015-09-06 DIAGNOSIS — M25561 Pain in right knee: Secondary | ICD-10-CM | POA: Diagnosis not present

## 2015-09-06 DIAGNOSIS — M12861 Other specific arthropathies, not elsewhere classified, right knee: Secondary | ICD-10-CM | POA: Diagnosis not present

## 2015-09-24 ENCOUNTER — Other Ambulatory Visit: Payer: Self-pay

## 2015-09-24 NOTE — Patient Outreach (Addendum)
[facility]) Care Management  09/24/2015  Bryttni Wissink 1926-05-22 [id number]  Telephonic Screen and Initial Assessment   Referral Date:  09/19/15 Source:  NextGen tier 4 list as of 08/01/15.  H/o admissions 2; Ambulance encounters 2; home health 6. Please assess if patient is seeing Primary MD on a regular basis.   Providers: Primary MD: Dr. Merri Brunette -  last appt:  Around the end of the year 2016..    next appt:  Yearly or as needed.  Cardiologist:  Dr. Herbie Baltimore 12/07/2014 Eye appt:  2015 Hearing:  Last appt  09/2015 HH:  none Insurance: Medicare, [employer] Plan   Social: Patient lives in [place] since [place].   Mobility: walking 3 times a day.  Falls: 1 near fall this week while grabbing for a chair and hit back but was able to catch herself before a complete fall to the floor.  Patient has pull cords in her home for urgent needs at the ILF.  Pain: yes with activity.  Transportation:  Daughter in Social worker or son, [place] and [place].   Caregiver: Self and Son cleans for patient but unable to provide physical care.  Advanced Directive:  Yes, Son,  Patient is not sure if MD is aware document has been completed.  Resources: none DME:  Cane, walker, hand bar by toilet and shower, transport chair, glasses, hearing aide (Bilateral), BP cuff  THN conditions:  DM 2 Other:  HTN, Atrial Fibrillation, Hyperlipidemia, MS, Hypothyroidism Surgery:   Periprosthetic fracture around internal prosthetic right hip joint Hip Surgery 01/29/15  And fractured hip following surgery requiring SNF/Rehab - [facility] (02/2015).    Admissions: 0 and ER visits: 0 over past 6 months.  A1C:  6.3 (06/14/2015) BS - states MD does not require BS checks any longer.  States BS has improved since discontinuing juice consumption.   Medications:  Patient taking less than 10 medications  Co-pay cost issue:  90 day refills / local pharmacy:  [facility].  Flu Vaccine:  05/15/15 Pneumonia Vaccine:  PCV13 03/02/2014 and PPSV23 06/30/2010   Plan: Referral Date: 09/19/2015 Screening and Initial Assessment 09/24/2015 Telephonic RN CM Services:  09/24/2015 Program:  DM 2  DM 2 RN CM will contact Dr. Renne Crigler to verify patient no longer needs to self check home BS readings.   Fall Risk RN CM provided fall prevention education and completed fall assessment.  RN CM discussed option of possible need for home PT (offered at ILF).  Patient agreed to PT if able to get services at the facility.  EMMI Ed mailed 09/24/2015 -Preventing Falls -Bathroom Safety - Adults -Preventing Falls - What to Ask Your Doctor RN CM will contact Dr. Renne Crigler to verify date of last appt and next appt.  RN CM will follow-up with Dr. Renne Crigler to verify if MD needs face to face office visit or if able to order PT services based on last appt.      RN CM notified [facility] Care Management Assistant: agreed to services/case opened. RN CM sent successful outreach letter and  [facility] Introductory package 09/24/15. RN CM sent Physician Enrollment and Barriers Letter to Primary MD 09/24/15 RN CM scheduled next contact call within the next 30 days for monthly assessment and care coordination services as needed.   RN CM advised to please notify MD of any changes in condition prior to scheduled appt's.   RN CM provided contact name and # [phone] or main office # [phone] and  24-hour nurse line # [phone].  RN CM confirmed patient is aware of 911 services for urgent emergency needs.  Mariann Laster, RN, BSN, [facility], CCM  [facility] [employer] Management Coordinator [phone] Direct [phone] Cell [phone] Office [phone] Fax

## 2015-09-29 ENCOUNTER — Other Ambulatory Visit: Payer: Self-pay | Admitting: Cardiology

## 2015-09-30 NOTE — Telephone Encounter (Signed)
Rx request sent to pharmacy.  

## 2015-10-22 ENCOUNTER — Other Ambulatory Visit: Payer: Self-pay

## 2015-10-22 NOTE — Patient Outreach (Signed)
[facility]) Care Management  10/22/2015  Makayla Thomas 1925/12/10 [id number]   Outreach call #1 for telephonic monthly assessment.  Patient not reached.  NO answer at home #.  RN CM scheduled for next contact call within one week.   Donato Schultz, RN, BSN, [facility], CCM  [facility] [employer] Management Coordinator [phone] Direct [phone] Cell [phone] Office [phone] Fax

## 2015-10-22 NOTE — Patient Outreach (Signed)
[facility]) Care Management  10/22/2015  Makayla Thomas 06/15/1926 [id number]  Care Coordination Services  Contact call to Primary MD:  Dr. Renne Crigler [phone] Contact: Marchelle Folks  Patient lives in [place] since 2011.  Mobility: walking 3 times a day.  Falls: 1 near fall this week of 09/24/2015 while grabbing for a chair and hit back but was able to catch herself before a complete fall to the floor.  RN CM verified last MD appt 09/06/2015 for Right Knee Pain. Next appt 07/02/2016.  RN CM updated that patient is interested in PT services at the [facility]F - [place] [phone] where services are offered on site.   Donato Schultz, RN, BSN, [facility], CCM  [facility] [employer] Management Coordinator [phone] Direct [phone] Cell [phone] Office [phone] Fax

## 2015-10-24 ENCOUNTER — Other Ambulatory Visit: Payer: Self-pay

## 2015-10-24 NOTE — Patient Outreach (Signed)
[facility]) Care Management  10/24/2015  Crystalann Devendorf 1925/12/03 [id number]  Outreach call #2 for telephonic monthly assessment.  Patient not reached. NO answer at home or cell  #.  RN CM scheduled for next contact call within one week.   Donato Schultz, RN, BSN, [facility], CCM  [facility] [employer] Management Coordinator [phone] Direct [phone] Cell [phone] Office [phone] Fax

## 2015-10-28 ENCOUNTER — Other Ambulatory Visit: Payer: Self-pay

## 2015-10-28 NOTE — Patient Outreach (Addendum)
[place] [facility]) Care Management  [place]  10/28/2015   Makayla Thomas [birth year] [id number]  Subjective:  Outreach call #3 for telephonic monthly assessment.  Patient not reached at home # .  RN CM left HIPAA compliant voice message with name and call back #.  Additional outreach to cell #.  Patient reached. Patient states she is getting PT services now and does not wish to continue with [facility] services.  States she is 82 yo and unable to keep up with so many things and wishes to only be in contact with her primary MD.   Patient confirms she is active with [facility] PT services at the [place] [phone] where services are offered on site.  States she has no other needs that she can not discuss with her MD.  H/o last Primary MD appt 09/06/15 and next appt 07/02/2016.   Objective:   Current Medications:  Current Outpatient Prescriptions  Medication Sig Dispense Refill  . acetaminophen (TYLENOL) 325 MG tablet Take 1-2 tablets (325-650 mg total) by mouth every 6 (six) hours as needed for moderate pain.    . bisoprolol (ZEBETA) 10 MG tablet Take 1 tablet (10 mg total) by mouth daily. 30 tablet 2  . CARTIA XT 120 MG 24 hr capsule TAKE ONE CAPSULE BY MOUTH AT BEDTIME 90 capsule 0  . cholecalciferol (VITAMIN D) 1000 UNITS tablet Take 1,000 Units by mouth every evening.     . docusate sodium (COLACE) 100 MG capsule Take 1 capsule (100 mg total) by mouth 2 (two) times daily. 10 capsule 0  . feeding supplement, ENSURE ENLIVE, (ENSURE ENLIVE) LIQD Take 237 mLs by mouth 2 (two) times daily between meals. 237 mL 12  . ferrous sulfate 325 (65 FE) MG tablet Take 1 tablet (325 mg total) by mouth 3 (three) times daily after meals.  3  . furosemide (LASIX) 20 MG tablet Take 20 mg by mouth every morning.    . ondansetron (ZOFRAN) 4 MG tablet Take 1 tablet (4 mg total) by mouth every 6 (six) hours as needed for nausea. 20 tablet 0  . pravastatin (PRAVACHOL) 40 MG tablet Take 40  mg by mouth daily.    . Rivaroxaban (XARELTO) 15 MG TABS tablet Take 1 tablet (15 mg total) by mouth daily with breakfast. 30 tablet   . SYNTHROID 125 MCG tablet Take 125 mcg by mouth daily.    [name] tiZANidine (ZANAFLEX) 4 MG tablet Take 1 tablet (4 mg total) by mouth every 6 (six) hours as needed for muscle spasms. 40 tablet 0  . triamterene-hydrochlorothiazide (MAXZIDE-25) 37.5-25 MG per tablet Take 1 tablet by mouth every morning.     No current facility-administered medications for this visit.    Functional Status:  In your present state of health, do you have any difficulty performing the following activities: 09/24/2015 02/05/2015  Hearing? Tempie Donning  Vision? N N  Difficulty concentrating or making decisions? N N  Walking or climbing stairs? Y N  Dressing or bathing? N N  Doing errands, shopping? Tempie Donning  Preparing Food and eating ? Y -  Using the Toilet? N -  In the past six months, have you accidently leaked urine? N -  Do you have problems with loss of bowel control? N -  Managing your Medications? N -  Managing your Finances? N -  Housekeeping or managing your Housekeeping? Y -    Assessment: RN CM and patient goals of care met at this time.  Patient achieved getting PT services.  DM well managed and seeing Primary MD yearly or as needed.   Plan:  RN CM contacted Dr. Pennie Banter office for appt verification:   Last appt 09/06/15 and next appt 07/02/16.   RN CM sent case closure letter to Dr. Shelia Media.  RN CM notified [place] Management Assistant of case closure.   Mariann Laster, RN, BSN, [facility], CCM  [facility] [employer] Management Coordinator [phone] Direct [phone] Cell [phone] Office [phone] Fax

## 2015-11-05 DIAGNOSIS — Z8673 Personal history of transient ischemic attack (TIA), and cerebral infarction without residual deficits: Secondary | ICD-10-CM | POA: Diagnosis not present

## 2015-11-05 DIAGNOSIS — Z87440 Personal history of urinary (tract) infections: Secondary | ICD-10-CM | POA: Diagnosis not present

## 2015-11-05 DIAGNOSIS — Z96652 Presence of left artificial knee joint: Secondary | ICD-10-CM | POA: Diagnosis not present

## 2015-11-05 DIAGNOSIS — I10 Essential (primary) hypertension: Secondary | ICD-10-CM | POA: Diagnosis not present

## 2015-11-05 DIAGNOSIS — G8929 Other chronic pain: Secondary | ICD-10-CM | POA: Diagnosis not present

## 2015-11-05 DIAGNOSIS — Z96651 Presence of right artificial knee joint: Secondary | ICD-10-CM | POA: Diagnosis not present

## 2015-11-05 DIAGNOSIS — I482 Chronic atrial fibrillation: Secondary | ICD-10-CM | POA: Diagnosis not present

## 2015-11-05 DIAGNOSIS — R262 Difficulty in walking, not elsewhere classified: Secondary | ICD-10-CM | POA: Diagnosis not present

## 2015-11-05 DIAGNOSIS — Z7901 Long term (current) use of anticoagulants: Secondary | ICD-10-CM | POA: Diagnosis not present

## 2015-11-05 DIAGNOSIS — Z993 Dependence on wheelchair: Secondary | ICD-10-CM | POA: Diagnosis not present

## 2015-11-05 DIAGNOSIS — I739 Peripheral vascular disease, unspecified: Secondary | ICD-10-CM | POA: Diagnosis not present

## 2015-11-05 DIAGNOSIS — M1990 Unspecified osteoarthritis, unspecified site: Secondary | ICD-10-CM | POA: Diagnosis not present

## 2015-11-07 DIAGNOSIS — I739 Peripheral vascular disease, unspecified: Secondary | ICD-10-CM | POA: Diagnosis not present

## 2015-11-07 DIAGNOSIS — R262 Difficulty in walking, not elsewhere classified: Secondary | ICD-10-CM | POA: Diagnosis not present

## 2015-11-07 DIAGNOSIS — G8929 Other chronic pain: Secondary | ICD-10-CM | POA: Diagnosis not present

## 2015-11-07 DIAGNOSIS — Z87440 Personal history of urinary (tract) infections: Secondary | ICD-10-CM | POA: Diagnosis not present

## 2015-11-07 DIAGNOSIS — I10 Essential (primary) hypertension: Secondary | ICD-10-CM | POA: Diagnosis not present

## 2015-11-07 DIAGNOSIS — M1990 Unspecified osteoarthritis, unspecified site: Secondary | ICD-10-CM | POA: Diagnosis not present

## 2015-11-11 DIAGNOSIS — R262 Difficulty in walking, not elsewhere classified: Secondary | ICD-10-CM | POA: Diagnosis not present

## 2015-11-11 DIAGNOSIS — I10 Essential (primary) hypertension: Secondary | ICD-10-CM | POA: Diagnosis not present

## 2015-11-11 DIAGNOSIS — M1990 Unspecified osteoarthritis, unspecified site: Secondary | ICD-10-CM | POA: Diagnosis not present

## 2015-11-11 DIAGNOSIS — G8929 Other chronic pain: Secondary | ICD-10-CM | POA: Diagnosis not present

## 2015-11-11 DIAGNOSIS — I739 Peripheral vascular disease, unspecified: Secondary | ICD-10-CM | POA: Diagnosis not present

## 2015-11-11 DIAGNOSIS — Z87440 Personal history of urinary (tract) infections: Secondary | ICD-10-CM | POA: Diagnosis not present

## 2015-11-14 DIAGNOSIS — I10 Essential (primary) hypertension: Secondary | ICD-10-CM | POA: Diagnosis not present

## 2015-11-14 DIAGNOSIS — R262 Difficulty in walking, not elsewhere classified: Secondary | ICD-10-CM | POA: Diagnosis not present

## 2015-11-14 DIAGNOSIS — Z87440 Personal history of urinary (tract) infections: Secondary | ICD-10-CM | POA: Diagnosis not present

## 2015-11-14 DIAGNOSIS — M1990 Unspecified osteoarthritis, unspecified site: Secondary | ICD-10-CM | POA: Diagnosis not present

## 2015-11-14 DIAGNOSIS — G8929 Other chronic pain: Secondary | ICD-10-CM | POA: Diagnosis not present

## 2015-11-14 DIAGNOSIS — I739 Peripheral vascular disease, unspecified: Secondary | ICD-10-CM | POA: Diagnosis not present

## 2015-11-18 DIAGNOSIS — M1990 Unspecified osteoarthritis, unspecified site: Secondary | ICD-10-CM | POA: Diagnosis not present

## 2015-11-18 DIAGNOSIS — I739 Peripheral vascular disease, unspecified: Secondary | ICD-10-CM | POA: Diagnosis not present

## 2015-11-18 DIAGNOSIS — R262 Difficulty in walking, not elsewhere classified: Secondary | ICD-10-CM | POA: Diagnosis not present

## 2015-11-18 DIAGNOSIS — I10 Essential (primary) hypertension: Secondary | ICD-10-CM | POA: Diagnosis not present

## 2015-11-18 DIAGNOSIS — Z87440 Personal history of urinary (tract) infections: Secondary | ICD-10-CM | POA: Diagnosis not present

## 2015-11-18 DIAGNOSIS — G8929 Other chronic pain: Secondary | ICD-10-CM | POA: Diagnosis not present

## 2015-11-22 DIAGNOSIS — G8929 Other chronic pain: Secondary | ICD-10-CM | POA: Diagnosis not present

## 2015-11-22 DIAGNOSIS — Z87440 Personal history of urinary (tract) infections: Secondary | ICD-10-CM | POA: Diagnosis not present

## 2015-11-22 DIAGNOSIS — R262 Difficulty in walking, not elsewhere classified: Secondary | ICD-10-CM | POA: Diagnosis not present

## 2015-11-22 DIAGNOSIS — M1990 Unspecified osteoarthritis, unspecified site: Secondary | ICD-10-CM | POA: Diagnosis not present

## 2015-11-22 DIAGNOSIS — I739 Peripheral vascular disease, unspecified: Secondary | ICD-10-CM | POA: Diagnosis not present

## 2015-11-22 DIAGNOSIS — I10 Essential (primary) hypertension: Secondary | ICD-10-CM | POA: Diagnosis not present

## 2015-11-25 DIAGNOSIS — I10 Essential (primary) hypertension: Secondary | ICD-10-CM | POA: Diagnosis not present

## 2015-11-25 DIAGNOSIS — I739 Peripheral vascular disease, unspecified: Secondary | ICD-10-CM | POA: Diagnosis not present

## 2015-11-25 DIAGNOSIS — G8929 Other chronic pain: Secondary | ICD-10-CM | POA: Diagnosis not present

## 2015-11-25 DIAGNOSIS — Z87440 Personal history of urinary (tract) infections: Secondary | ICD-10-CM | POA: Diagnosis not present

## 2015-11-25 DIAGNOSIS — M1990 Unspecified osteoarthritis, unspecified site: Secondary | ICD-10-CM | POA: Diagnosis not present

## 2015-11-25 DIAGNOSIS — R262 Difficulty in walking, not elsewhere classified: Secondary | ICD-10-CM | POA: Diagnosis not present

## 2015-11-29 DIAGNOSIS — R262 Difficulty in walking, not elsewhere classified: Secondary | ICD-10-CM | POA: Diagnosis not present

## 2015-11-29 DIAGNOSIS — Z87440 Personal history of urinary (tract) infections: Secondary | ICD-10-CM | POA: Diagnosis not present

## 2015-11-29 DIAGNOSIS — I10 Essential (primary) hypertension: Secondary | ICD-10-CM | POA: Diagnosis not present

## 2015-11-29 DIAGNOSIS — M1990 Unspecified osteoarthritis, unspecified site: Secondary | ICD-10-CM | POA: Diagnosis not present

## 2015-11-29 DIAGNOSIS — I739 Peripheral vascular disease, unspecified: Secondary | ICD-10-CM | POA: Diagnosis not present

## 2015-11-29 DIAGNOSIS — G8929 Other chronic pain: Secondary | ICD-10-CM | POA: Diagnosis not present

## 2015-12-16 ENCOUNTER — Ambulatory Visit (INDEPENDENT_AMBULATORY_CARE_PROVIDER_SITE_OTHER): Payer: Medicare Other | Admitting: Cardiology

## 2015-12-16 ENCOUNTER — Encounter: Payer: Self-pay | Admitting: Cardiology

## 2015-12-16 VITALS — BP 116/76 | HR 63 | Ht 60.0 in | Wt 183.8 lb

## 2015-12-16 DIAGNOSIS — E785 Hyperlipidemia, unspecified: Secondary | ICD-10-CM

## 2015-12-16 DIAGNOSIS — I4821 Permanent atrial fibrillation: Secondary | ICD-10-CM

## 2015-12-16 DIAGNOSIS — I10 Essential (primary) hypertension: Secondary | ICD-10-CM

## 2015-12-16 DIAGNOSIS — I482 Chronic atrial fibrillation, unspecified: Secondary | ICD-10-CM

## 2015-12-16 DIAGNOSIS — I350 Nonrheumatic aortic (valve) stenosis: Secondary | ICD-10-CM

## 2015-12-16 DIAGNOSIS — Z7901 Long term (current) use of anticoagulants: Secondary | ICD-10-CM

## 2015-12-16 DIAGNOSIS — Z8673 Personal history of transient ischemic attack (TIA), and cerebral infarction without residual deficits: Secondary | ICD-10-CM

## 2015-12-16 MED ORDER — BISOPROLOL FUMARATE 10 MG PO TABS: 10.0000 mg | ORAL_TABLET | Freq: Every day | ORAL | Status: DC

## 2015-12-16 NOTE — Patient Instructions (Signed)
NO CHANGE WITH CURRENT MEDICATIONS   Your physician wants you to follow-up in 12 MONTHS WITH DR HARDING. You will receive a reminder letter in the mail two months in advance. If you don't receive a letter, please call our office to schedule the follow-up appointment.     If you need a refill on your cardiac medications before your next appointment, please call your pharmacy.  

## 2015-12-16 NOTE — Progress Notes (Signed)
PCP: Londell Moh, MD  Clinic Note: Chief Complaint  Patient presents with  . Annual Exam    Permanent AFIB  pt states a couple weeks ago, had some SOB and swelling legs/feet/ankles    HPI: Makayla Thomas is a 82 y.o. female with a PMH below who presents today for Annual F/u of Chronic Atrial Fibrillation.  Makayla Thomas was last seen in April 2016. Was doing well - pre-op Hip Sgx.   Recent Hospitalizations:  Jan 2016 - TIA, but ended up being dehydration due to Bowel issue & Medication RXn Jun 2016 -  Hip Prosthesis fxr -- re-placed. (Periprosthetic fracture around internal prosthetic right hip joint) -- still having pain.  Studies Reviewed:  PMH updated  Echo Jan 2016: Mild concentric LVH. EF 60-65%. CRO RWMA. DD with elevated filling pressures. . mild-moderate aortic stenosis (mean gradient 18 mmHg). Moderate MAC with moderate MR. Moderate LA dilation. Moderate TR - estimated PA pressure 44 mmHg (mild-moderate PA hypertension) --> AORTIC STENOSIS has increased from 2011   Interval History: Makayla Thomas presents today doing well without any major complaints or symptoms. She is not noted any recurrent episodes of rapid irregular heartbeats palpitations to suggest recurrence of A. fib. About a week or 2 ago she had a little bit of dyspnea with swelling, but this is all resolved now. She took a another dose of Lasix but otherwise has been stable. No PND, orthopnea or persistent edema.  She basically says that she has been stable since her last visit. No major complaints.  No chest pain or shortness of breath with rest or exertion. No palpitations, lightheadedness, dizziness, weakness or syncope/near syncope. No TIA/amaurosis fugax symptoms. No melena, hematochezia, hematuria, or epstaxis. No claudication.  ROS: A comprehensive was performed. ROS   Past Medical History  Diagnosis Date  . Permanent atrial fibrillation (HCC)   . Chronic anticoagulation     on Xarelto  .  Hypothyroidism   . Dyslipidemia     treated  . PAD (peripheral artery disease) (HCC) 08/30/2009    Dopplers; with bil. post. tib artery occlusion  . Lower extremity edema     chronic  . Aortic stenosis, moderate (was mild) 08/2009; 08/2014    a. ECHO  EF 55-60%, wth increased EDP, mild AS;; b. mild Conc LVH, EF 60-65%, DD with high LVEDP, MIld-Mod AS, Mod MAC with Mod MR, mild-mod PA HTN.  . H/O cardiovascular stress test 05/2010    Lexiscan cardiolite no ischemia or infarction  . History of ETT 02/2011    [place] exercise protocol, negative with poor exercise tolerance  . H/O multiple sclerosis     no excaerbations in some time  . Complication of anesthesia     hallusinations  . Dysrhythmia     A-FIB  . Visual disturbances     "FLASHING IN MY EYES"  . Arthritis   . Vaginal itching   . Constipation   . Anemia   . History of transfusion   . Borderline diabetes   . Transient ischemic attack (TIA)     NO RESIDUAL PROBLEMS  . Diabetes mellitus without complication (HCC)     possibly    Past Surgical History  Procedure Laterality Date  . Cesarean section      hx of 3  . Throidectomy partial      lt  . Cholecystectomy    . Breast surgery      L tumor removed - benign  . Joint replacement  2012  left total knee  . Total hip arthroplasty Right 01/29/2015    Procedure: RIGHT TOTAL HIP ARTHROPLASTY ANTERIOR APPROACH;  Surgeon: Durene Romans, MD;  Location: [facility];  Service: Orthopedics;  Laterality: Right;  . Orif periprosthetic fracture Right 02/07/2015    Procedure: OPEN REDUCTION INTERNAL FIXATION (ORIF) PERIPROSTHETIC FRACTURE WITH TOTAL HIP REVISION AND CABLES;  Surgeon: Durene Romans, MD;  Location: [facility];  Service: Orthopedics;  Laterality: Right;    Prior to Admission medications   Medication Sig Start Date End Date Taking? Authorizing Provider  acetaminophen (TYLENOL) 325 MG tablet Take 1-2 tablets (325-650 mg total) by mouth every 6 (six) hours as needed for moderate  pain. 02/01/15  Yes Lanney Gins, PA-C  bisoprolol (ZEBETA) 10 MG tablet Take 1 tablet (10 mg total) by mouth daily. 09/03/14  Yes Richarda Overlie, MD  CARTIA XT 120 MG 24 hr capsule TAKE ONE CAPSULE BY MOUTH AT BEDTIME 09/30/15  Yes Marykay Lex, MD  cholecalciferol (VITAMIN D) 1000 UNITS tablet Take 1,000 Units by mouth every evening.    Yes Historical Provider, MD  docusate sodium (COLACE) 100 MG capsule Take 1 capsule (100 mg total) by mouth 2 (two) times daily. 02/01/15  Yes Lanney Gins, PA-C  feeding supplement, ENSURE ENLIVE, (ENSURE ENLIVE) LIQD Take 237 mLs by mouth 2 (two) times daily between meals. 02/12/15  Yes Kathlen Mody, MD  ferrous sulfate 325 (65 FE) MG tablet Take 1 tablet (325 mg total) by mouth 3 (three) times daily after meals. 02/01/15  Yes Lanney Gins, PA-C  furosemide (LASIX) 20 MG tablet Take 20 mg by mouth every morning.   Yes Historical Provider, MD  levothyroxine (SYNTHROID, LEVOTHROID) 125 MCG tablet Take 125 mcg by mouth daily before breakfast.   Yes Historical Provider, MD  ondansetron (ZOFRAN) 4 MG tablet Take 1 tablet (4 mg total) by mouth every 6 (six) hours as needed for nausea. 02/12/15  Yes Kathlen Mody, MD  pravastatin (PRAVACHOL) 40 MG tablet Take 40 mg by mouth daily.   Yes Historical Provider, MD  Rivaroxaban (XARELTO) 15 MG TABS tablet Take 1 tablet (15 mg total) by mouth daily with breakfast. 02/12/15  Yes Kathlen Mody, MD  tiZANidine (ZANAFLEX) 4 MG tablet Take 1 tablet (4 mg total) by mouth every 6 (six) hours as needed for muscle spasms. 02/01/15  Yes Lanney Gins, PA-C  triamterene-hydrochlorothiazide (MAXZIDE-25) 37.5-25 MG per tablet Take 1 tablet by mouth every morning.   Yes Historical Provider, MD   Allergies  Allergen Reactions  . Tetanus Antitoxin Anaphylaxis    Throat swelling  . Meclizine Other (See Comments)    Had funny feeling  . Tramadol Other (See Comments)    hallucinations    Social History   Social History  . Marital  Status: Widowed    Spouse Name: N/A  . Number of Children: 3  . Years of Education: N/A   Social History Main Topics  . Smoking status: Never Smoker   . Smokeless tobacco: Never Used  . Alcohol Use: Yes     Comment: 1 glass every other week at the most  . Drug Use: Yes    Special: Fentanyl  . Sexual Activity: Not Asked   Other Topics Concern  . None   Social History Narrative   Uses a walker to walk    Family History  Problem Relation Age of Onset  . Heart failure Father 84    Wt Readings from Last 3 Encounters:  12/16/15 183 lb 12.8 oz (83.371  kg)  09/24/15 179 lb (81.194 kg)  03/12/15 186 lb 9.6 oz (84.641 kg)    PHYSICAL EXAM BP 116/76 mmHg  Pulse 63  Ht 5' (1.524 m)  Wt 183 lb 12.8 oz (83.371 kg)  BMI 35.90 kg/m2 General appearance: alert, cooperative, appears stated age, no distress, mildly obese and Very pleasant mood and affect HEENT: [place]/AT, EOMI, MMM, anicteric sclera Neck: no adenopathy, no carotid bruit, no JVD, supple, symmetrical, trachea midline, thyroid not enlarged, symmetric, no tenderness/mass/nodules and  Lungs: clear to auscultation bilaterally, normal percussion bilaterally and Nonlabored Heart: normal apical impulse, irregularly irregular rhythm, no S3 or S4, no rub and Controlled rate; 1/6 SEM at RUSB. Abdomen: soft, non-tender; bowel sounds normal; no masses, no organomegaly and Mildly obese Extremities: edema Trace to 1+, stable Pulses: Reduced PT pulses bilaterally. Neuro/Psych: CN II-XII grossly intact, normal strength. Normal Mod & affect    Adult ECG Report  Rate: 63 ;  Rhythm: atrial fibrillation and Controlled ventricular rate. Otherwise normal axis, intervals and durations.;   Narrative Interpretation: Stable EKG   Other studies Reviewed: Additional studies/ records that were reviewed today include:  Recent Labs: Checked by PCP   ASSESSMENT / PLAN: Problem List Items Addressed This Visit    Permanent atrial fibrillation  (HCC) - Primary (Chronic)    She remains stable. Rate controlled on beta blocker and diltiazem. She is asked that we change her prescription for bisoprolol to 90 day supply. This patients CHA2DS2-VASc Score and unadjusted Ischemic Stroke Rate (% per year) is equal to 9.7 % stroke rate/year from a score of 6 Anticoagulated with Xarelto.      Relevant Medications   bisoprolol (ZEBETA) 10 MG tablet   Moderate aortic stenosis (Chronic)    Mild-moderate aortic stenosis in 2016. No change in murmur. Consider follow-up echo next year.      Relevant Medications   bisoprolol (ZEBETA) 10 MG tablet   History of TIA (transient ischemic attack) (Chronic)   Essential hypertension -well controlled (Chronic)    Well-controlled on 3 medications. No change.      Relevant Medications   bisoprolol (ZEBETA) 10 MG tablet   Other Relevant Orders   EKG 12-Lead   Dyslipidemia, goal LDL below 100 - due aortic stenosis (Chronic)    On statin. Monitored by PCP.      Relevant Medications   bisoprolol (ZEBETA) 10 MG tablet   Other Relevant Orders   EKG 12-Lead   Chronic anticoagulation (Chronic)    On Xarelto. No bleeding issues.      Chronic a-fib (HCC) (Chronic)   Relevant Medications   bisoprolol (ZEBETA) 10 MG tablet   Other Relevant Orders   EKG 12-Lead      Current medicines are reviewed at length with the patient today. (+/- concerns) change BB to q90days The following changes have been made: Refill Bisoprolol for 90 d  No medication changes.  One-year follow-up  Studies Ordered:   Orders Placed This Encounter  Procedures  . EKG 12-Lead    ROV 1 yr   Marykay Lex, M.D., M.S. Interventional Cardiologist   Pager # [phone] Phone # [phone] [address] [place], [place] [address]

## 2015-12-17 ENCOUNTER — Encounter: Payer: Self-pay | Admitting: Cardiology

## 2015-12-18 ENCOUNTER — Encounter: Payer: Self-pay | Admitting: Cardiology

## 2015-12-18 NOTE — Assessment & Plan Note (Addendum)
She remains stable. Rate controlled on beta blocker and diltiazem. She is asked that we change her prescription for bisoprolol to 90 day supply. This patients CHA2DS2-VASc Score and unadjusted Ischemic Stroke Rate (% per year) is equal to 9.7 % stroke rate/year from a score of 6 Anticoagulated with Xarelto.

## 2015-12-18 NOTE — Assessment & Plan Note (Signed)
Mild-moderate aortic stenosis in 2016. No change in murmur. Consider follow-up echo next year.

## 2015-12-18 NOTE — Assessment & Plan Note (Signed)
On Xarelto. No bleeding issues.

## 2015-12-18 NOTE — Assessment & Plan Note (Signed)
Well-controlled on 3 medications. No change.

## 2015-12-18 NOTE — Assessment & Plan Note (Signed)
On statin. Monitored by PCP. 

## 2015-12-25 DIAGNOSIS — Z8669 Personal history of other diseases of the nervous system and sense organs: Secondary | ICD-10-CM | POA: Diagnosis not present

## 2015-12-25 DIAGNOSIS — M170 Bilateral primary osteoarthritis of knee: Secondary | ICD-10-CM | POA: Diagnosis not present

## 2015-12-25 DIAGNOSIS — E1129 Type 2 diabetes mellitus with other diabetic kidney complication: Secondary | ICD-10-CM | POA: Diagnosis not present

## 2015-12-25 DIAGNOSIS — R432 Parageusia: Secondary | ICD-10-CM | POA: Diagnosis not present

## 2015-12-27 DIAGNOSIS — G8929 Other chronic pain: Secondary | ICD-10-CM | POA: Diagnosis not present

## 2015-12-27 DIAGNOSIS — I739 Peripheral vascular disease, unspecified: Secondary | ICD-10-CM | POA: Diagnosis not present

## 2015-12-27 DIAGNOSIS — R262 Difficulty in walking, not elsewhere classified: Secondary | ICD-10-CM | POA: Diagnosis not present

## 2016-01-01 ENCOUNTER — Telehealth: Payer: Self-pay | Admitting: *Deleted

## 2016-01-01 NOTE — Telephone Encounter (Signed)
Message For: OFC                  Taken 17-MAY-17 at 10:43AM by JPO ------------------------------------------------------------ Francee Piccolo             CID [id number]  Patient SAME                 Pt's Dr N/A          Area Code 336 Phone# [phone] * DOB [birth year]       RE MISSED CALL FROM OFFICE/RETURNING CALL REF'D BY   HER DR. CELL # [phone]                          Disp:Y/N N If Y = C/B If No Response In ============================================================ Betty called pt.

## 2016-01-08 ENCOUNTER — Other Ambulatory Visit: Payer: Self-pay | Admitting: Cardiology

## 2016-01-09 NOTE — Telephone Encounter (Signed)
Rx(s) sent to pharmacy electronically.  

## 2016-01-14 DIAGNOSIS — M25561 Pain in right knee: Secondary | ICD-10-CM | POA: Diagnosis not present

## 2016-01-14 DIAGNOSIS — M1711 Unilateral primary osteoarthritis, right knee: Secondary | ICD-10-CM | POA: Diagnosis not present

## 2016-01-17 DIAGNOSIS — I482 Chronic atrial fibrillation: Secondary | ICD-10-CM | POA: Diagnosis not present

## 2016-01-17 DIAGNOSIS — M1711 Unilateral primary osteoarthritis, right knee: Secondary | ICD-10-CM | POA: Diagnosis not present

## 2016-01-21 DIAGNOSIS — M1711 Unilateral primary osteoarthritis, right knee: Secondary | ICD-10-CM | POA: Diagnosis not present

## 2016-01-21 DIAGNOSIS — I482 Chronic atrial fibrillation: Secondary | ICD-10-CM | POA: Diagnosis not present

## 2016-01-22 ENCOUNTER — Encounter: Payer: Self-pay | Admitting: Neurology

## 2016-01-22 ENCOUNTER — Ambulatory Visit (INDEPENDENT_AMBULATORY_CARE_PROVIDER_SITE_OTHER): Payer: Medicare Other | Admitting: Neurology

## 2016-01-22 VITALS — BP 102/58 | HR 70 | Resp 16 | Ht 60.0 in | Wt 182.0 lb

## 2016-01-22 DIAGNOSIS — R208 Other disturbances of skin sensation: Secondary | ICD-10-CM

## 2016-01-22 DIAGNOSIS — Z8669 Personal history of other diseases of the nervous system and sense organs: Secondary | ICD-10-CM

## 2016-01-22 DIAGNOSIS — R481 Agnosia: Secondary | ICD-10-CM

## 2016-01-22 DIAGNOSIS — R200 Anesthesia of skin: Secondary | ICD-10-CM

## 2016-01-22 DIAGNOSIS — G35 Multiple sclerosis: Secondary | ICD-10-CM

## 2016-01-22 NOTE — Patient Instructions (Signed)
We will do a brain scan, called MRI and call you with the test results. We will have to schedule you for this on a separate date. This test requires authorization from your insurance, and we will take care of the insurance process. We will do an EMG and nerve conduction velocity test, which is an electrical nerve and muscle test, which we will schedule. We will call you with the results.  I am not sure how to explain you lack of sense of smell. Please discuss with Dr. Renne Crigler the possibility of seeing an ENT (ear, nose, throat) physician.

## 2016-01-22 NOTE — Progress Notes (Signed)
Subjective:    Patient ID: Makayla Thomas is a 82 y.o. female.  HPI    Huston Foley, MD, PhD [facility] [address], [address] [address] [place], [place] [address]  Dear Dr. Renne Crigler,  I saw your patient, Makayla Thomas, upon your kind request in my neurologic clinic today for initial consultation of her recent history of taste changes and longer standing history of hand numbness and a prior diagnosis of MS. The patient is unaccompanied today. As you know, Makayla Thomas is an 82 year old right-handed Thomas with an underlying complex medical history of chronic A. fib on chronic anticoagulation with xarelto, hypertension, aortic stenosis, history of stroke, hypothyroidism, renal failure, history of hyperlipidemia, obesity, TIA, arthritis, status post right total hip replacement surgery, who reports an approximately one-year history of difficulty smelling and tasting food. She says that this started a few days after she had her hip replacement surgery. Unfortunately, she had problems with her hip replacement surgery. She had a total hip replacement surgery on the right on 01/29/2015 under spinal anesthesia and then a few days later she had a complication with periprostatic fracture and needed additional surgery with open reduction and internal fixation under general anesthesia. She says that a few days after she was discharged home she started having this problem with her smell. Thus far, nasal steroid spray has not made a difference. She additionally also reports difficulty with hand numbness which has been ongoing for months or years. She reports a remote diagnosis of MS. She was at the time not officially seen by a neurologist as I understand. She reports a history of bilateral blindness at the same time when she was 82 years old. She was not treated with steroids at the time from what I understand. She had improvement after a few days and went back to work and then had  exacerbation of her blindness and then improvement with time. At some point several years ago she saw a neurologist in [place]. She has never been on any disease modifying medication. She had an MRI brain without contrast on 08/31/2014: IMPRESSION: Advanced atrophy and white matter disease.  See discussion above.   Resolved RIGHT posterior frontal acute infarct from 2011.   No acute intracranial abnormality. In addition, she had prior head CTs as well. She walks with a walker. She has residual hip pain on the right. She has not fallen recently. She lives in an apartment. She does not drive. She has some occasional tingling in her feet.   I reviewed your office note from 12/25/2015, which you kindly included. Most recent blood work through your office on 06/14/2015 showed normal TSH, normal CBC with differential, vitamin D at 32.5, BUN at 25, otherwise CMP unremarkable, A1c at 6.3. You suggested a trial of nasal steroids for about a month to see if it would make a difference in her sense of smell or her taste.  Her Past Medical History Is Significant For: Past Medical History  Diagnosis Date  . Permanent atrial fibrillation (HCC)   . Chronic anticoagulation     on Xarelto  . Hypothyroidism   . Dyslipidemia     treated  . PAD (peripheral artery disease) (HCC) 08/30/2009    Dopplers; with bil. post. tib artery occlusion  . Lower extremity edema     chronic  . Aortic stenosis, moderate (was mild) 08/2009; 08/2014    a. ECHO  EF 55-60%, wth increased EDP, mild AS;; b. mild Conc LVH, EF 60-65%,  DD with high LVEDP, MIld-Mod AS, Mod MAC with Mod MR, mild-mod PA HTN.  . H/O cardiovascular stress test 05/2010    Lexiscan cardiolite no ischemia or infarction  . History of ETT 02/2011    Naughton exercise protocol, negative with poor exercise tolerance  . H/O multiple sclerosis     no excaerbations in some time  . Complication of anesthesia     hallusinations  . Dysrhythmia     A-FIB  .  Visual disturbances     "FLASHING IN MY EYES"  . Arthritis   . Vaginal itching   . Constipation   . Anemia   . History of transfusion   . Borderline diabetes   . Transient ischemic attack (TIA)     NO RESIDUAL PROBLEMS  . Diabetes mellitus without complication (HCC)     possibly  . Hypertension     Her Past Surgical History Is Significant For: Past Surgical History  Procedure Laterality Date  . Cesarean section      hx of 3  . Throidectomy partial      lt  . Cholecystectomy    . Breast surgery      L tumor removed - benign  . Joint replacement  2012    left total knee  . Total hip arthroplasty Right 01/29/2015    Procedure: RIGHT TOTAL HIP ARTHROPLASTY ANTERIOR APPROACH;  Surgeon: Durene Romans, MD;  Location: [facility];  Service: Orthopedics;  Laterality: Right;  . Orif periprosthetic fracture Right 02/07/2015    Procedure: OPEN REDUCTION INTERNAL FIXATION (ORIF) PERIPROSTHETIC FRACTURE WITH TOTAL HIP REVISION AND CABLES;  Surgeon: Durene Romans, MD;  Location: [facility];  Service: Orthopedics;  Laterality: Right;    Her Family History Is Significant For: Family History  Problem Relation Age of Onset  . Heart failure Father 32  . Lung cancer Father     Her Social History Is Significant For: Social History   Social History  . Marital Status: Widowed    Spouse Name: N/A  . Number of Children: 3  . Years of Education: HS   Occupational History  . Retired    Social History Main Topics  . Smoking status: Never Smoker   . Smokeless tobacco: Never Used  . Alcohol Use: No  . Drug Use: No  . Sexual Activity: Not Asked   Other Topics Concern  . None   Social History Narrative   Uses a walker to walk   Drinks 1-2 cups of coffee a day     Her Allergies Are:  Allergies  Allergen Reactions  . Tetanus Antitoxin Anaphylaxis    Throat swelling  . Meclizine Other (See Comments)    Had funny feeling  . Tramadol Other (See Comments)    hallucinations  :   Her Current  Medications Are:  Outpatient Encounter Prescriptions as of 01/22/2016  Medication Sig  . acetaminophen (TYLENOL) 325 MG tablet Take 1-2 tablets (325-650 mg total) by mouth every 6 (six) hours as needed for moderate pain.  . bisoprolol (ZEBETA) 10 MG tablet Take 1 tablet (10 mg total) by mouth daily.  [name] CARTIA XT 120 MG 24 hr capsule TAKE ONE CAPSULE BY MOUTH AT BEDTIME  . cholecalciferol (VITAMIN D) 1000 UNITS tablet Take 1,000 Units by mouth every evening.   . furosemide (LASIX) 20 MG tablet Take 20 mg by mouth every morning.  [name] levothyroxine (SYNTHROID, LEVOTHROID) 125 MCG tablet Take 125 mcg by mouth daily before breakfast.  . Rivaroxaban (XARELTO) 15 MG  TABS tablet Take 1 tablet (15 mg total) by mouth daily with breakfast.  . triamterene-hydrochlorothiazide (MAXZIDE-25) 37.5-25 MG per tablet Take 1 tablet by mouth every morning.  . [DISCONTINUED] docusate sodium (COLACE) 100 MG capsule Take 1 capsule (100 mg total) by mouth 2 (two) times daily.  . [DISCONTINUED] feeding supplement, ENSURE ENLIVE, (ENSURE ENLIVE) LIQD Take 237 mLs by mouth 2 (two) times daily between meals.  . [DISCONTINUED] ferrous sulfate 325 (65 FE) MG tablet Take 1 tablet (325 mg total) by mouth 3 (three) times daily after meals.  . [DISCONTINUED] ondansetron (ZOFRAN) 4 MG tablet Take 1 tablet (4 mg total) by mouth every 6 (six) hours as needed for nausea.  . [DISCONTINUED] pravastatin (PRAVACHOL) 40 MG tablet Take 40 mg by mouth daily.  . [DISCONTINUED] tiZANidine (ZANAFLEX) 4 MG tablet Take 1 tablet (4 mg total) by mouth every 6 (six) hours as needed for muscle spasms.   No facility-administered encounter medications on file as of 01/22/2016.  :   Review of Systems:  Out of a complete 14 point review of systems, all are reviewed and negative with the exception of these symptoms as listed below:  Review of Systems  Neurological:       Patient reports that after 2nd hip replacement in May, she could not taste anything.   Patient also complains of bilateral hand numbness, she thinks this started right before her surgery. She has trouble turning book pages and picking up small items.  When putting weight on L hand, she sometimes gets a "warm, electrical" feeling that travels into her fingertips.      Objective:  Neurologic Exam  Physical Exam Physical Examination:   Filed Vitals:   01/22/16 1115  BP: 102/58  Pulse: 70  Resp: 16    General Examination: The patient is a very pleasant 82 y.o. female in no acute distress.She is well-developed and well-nourished appearing. She is in no acute distress. She brought her wheeled walker with seat.  HEENT: Normocephalic, atraumatic, pupils are equal, round and reactive to light and accommodation. Funduscopic exam is normal with sharp disc margins noted. She had cataract repairs. Hearing is grossly intact but she has hearing aids. There is no afferent pupillary defect. There is no nystagmus. Normal smooth pursuit is noted. Hearing is grossly intact. Face is symmetric with normal facial animation and normal facial sensation. Speech is clear with no dysarthria noted. There is no hypophonia. There is no lip, neck/head, jaw or voice tremor. Neck is supple with full range of passive and active motion. There are no carotid bruits on auscultation. Oropharynx exam reveals: moderate mouth dryness, full dentures in place. She has mild airway crowding.  Chest: Clear to auscultation without wheezing, rhonchi or crackles noted.  Heart: S1+S2+0, regular and normal without murmurs, rubs or gallops noted.   Abdomen: Soft, non-tender and non-distended with normal bowel sounds appreciated on auscultation.  Extremities: There is 1+ pitting edema in the distal lower extremities bilaterally. Pedal pulses are intact.  Skin: Warm and dry without trophic changes noted.   Musculoskeletal: exam reveals bilateral knee pain and low back pain.    Neurologically:  Mental status: The patient  is awake, alert and oriented in all 4 spheres. Her immediate and remote memory, attention, language skills and fund of knowledge are appropriate. There is no evidence of aphasia, agnosia, apraxia or anomia. Speech is clear with normal prosody and enunciation. Thought process is linear. Mood is normal and affect is normal.  Cranial nerves II -  XII are as described above under HEENT exam. In addition: shoulder shrug is normal with equal shoulder height noted. Motor exam: Fairly normal bulk with the exception of some thenar wasting bilaterally. Negative Tinel's sign bilaterally. Negative Phalen's sign. Normal tone is noted bilaterally. There is no drift, tremor or rebound. Romberg is negative. Reflexes are 1+ throughout. Babinski: Toes are flexor bilaterally. Fine motor skills and coordination: intact with normal finger taps, normal hand movements, normal rapid alternating patting, normal foot taps and normal foot agility.  Cerebellar testing: No dysmetria or intention tremor on finger to nose testing. Heel to shin is unremarkable bilaterally. There is no truncal or gait ataxia.  Sensory exam: intact to light touch, pinprick, vibration, temperature sense in the upper and lower extremities, with the exception of mild decrease in pinprick sensation and temperature sensation in both hands and to a lesser degree in the distal lower extremities.  Gait, station and balance: She stands with difficulty.  posture is stooped. She relies on her rolling walker. She walks slowly, and turns cautiously. Tandem walk is not possible.   Assessment and Plan:   In summary, Makayla Thomas is a very pleasant 82 y.o.-year old female with an underlying complex medical history of chronic A. fib on chronic anticoagulation with xarelto, hypertension, aortic stenosis, history of stroke, hypothyroidism, renal failure, history of hyperlipidemia, obesity, TIA, arthritis, status post right total hip replacement surgery, who reports  difficulty smelling and tasting food for the past year, hand numbness for several months or longer, remote history of multiple sclerosis. I do not have any records confirming that she was diagnosed with MS. She reports bilateral blindness at the age of 17, no history of treatment for this in the form of steroids. In addition, she was never placed on disease modifying medication. She also reports no history of relapse. At this juncture, we will initiate additional testing including brain MRI with and without contrast. Furthermore, we will do EMG and nerve conduction testing of the upper extremities. She may have carpal tunnel syndrome. She has otherwise a nonfocal exam. She is advised to consider seeing an ENT specialist if her smell and taste changes continue. She has been placed on a trial of nasal steroids, has not noticed much in the way of difference thus far. I will see her back routinely after these tests are completed. I answered all her questions today and she was in agreement.  Thank you very much for allowing me to participate in the care of this nice patient. If I can be of any further assistance to you please do not hesitate to call me at [phone].  Sincerely,   Huston Foley, MD, PhD

## 2016-01-23 DIAGNOSIS — I482 Chronic atrial fibrillation: Secondary | ICD-10-CM | POA: Diagnosis not present

## 2016-01-23 DIAGNOSIS — M1711 Unilateral primary osteoarthritis, right knee: Secondary | ICD-10-CM | POA: Diagnosis not present

## 2016-01-27 DIAGNOSIS — R200 Anesthesia of skin: Secondary | ICD-10-CM | POA: Diagnosis not present

## 2016-01-27 DIAGNOSIS — R430 Anosmia: Secondary | ICD-10-CM | POA: Diagnosis not present

## 2016-01-28 DIAGNOSIS — I482 Chronic atrial fibrillation: Secondary | ICD-10-CM | POA: Diagnosis not present

## 2016-01-28 DIAGNOSIS — M1711 Unilateral primary osteoarthritis, right knee: Secondary | ICD-10-CM | POA: Diagnosis not present

## 2016-01-30 DIAGNOSIS — M1711 Unilateral primary osteoarthritis, right knee: Secondary | ICD-10-CM | POA: Diagnosis not present

## 2016-01-30 DIAGNOSIS — I482 Chronic atrial fibrillation: Secondary | ICD-10-CM | POA: Diagnosis not present

## 2016-02-04 DIAGNOSIS — M1711 Unilateral primary osteoarthritis, right knee: Secondary | ICD-10-CM | POA: Diagnosis not present

## 2016-02-04 DIAGNOSIS — I482 Chronic atrial fibrillation: Secondary | ICD-10-CM | POA: Diagnosis not present

## 2016-02-07 DIAGNOSIS — I482 Chronic atrial fibrillation: Secondary | ICD-10-CM | POA: Diagnosis not present

## 2016-02-07 DIAGNOSIS — M1711 Unilateral primary osteoarthritis, right knee: Secondary | ICD-10-CM | POA: Diagnosis not present

## 2016-02-11 DIAGNOSIS — M1711 Unilateral primary osteoarthritis, right knee: Secondary | ICD-10-CM | POA: Diagnosis not present

## 2016-02-11 DIAGNOSIS — I482 Chronic atrial fibrillation: Secondary | ICD-10-CM | POA: Diagnosis not present

## 2016-02-12 DIAGNOSIS — M1711 Unilateral primary osteoarthritis, right knee: Secondary | ICD-10-CM | POA: Diagnosis not present

## 2016-02-12 DIAGNOSIS — I482 Chronic atrial fibrillation: Secondary | ICD-10-CM | POA: Diagnosis not present

## 2016-02-19 DIAGNOSIS — M1711 Unilateral primary osteoarthritis, right knee: Secondary | ICD-10-CM | POA: Diagnosis not present

## 2016-02-19 DIAGNOSIS — I482 Chronic atrial fibrillation: Secondary | ICD-10-CM | POA: Diagnosis not present

## 2016-02-21 DIAGNOSIS — M1711 Unilateral primary osteoarthritis, right knee: Secondary | ICD-10-CM | POA: Diagnosis not present

## 2016-02-21 DIAGNOSIS — I482 Chronic atrial fibrillation: Secondary | ICD-10-CM | POA: Diagnosis not present

## 2016-02-21 IMAGING — CT CT KNEE*L* W/O CM
3 series · 16 of 33 positions shown, 19 images · non-contrast
Comparison: Radiographs dated 06/10/2010

CLINICAL DATA: Pain and swelling.

EXAM:
CT OF THE LEFT KNEE WITHOUT CONTRAST
TECHNIQUE: Multidetector CT imaging of the left knee was performed according to
the standard protocol. Multiplanar CT image reconstructions were
also generated.

[Series 3: lt knee st · axial · 0.49mm/px · z∈[-673,-488]mm · 8 of 45 slices shown, 10 images]
[im 4/45  soft-tissue]
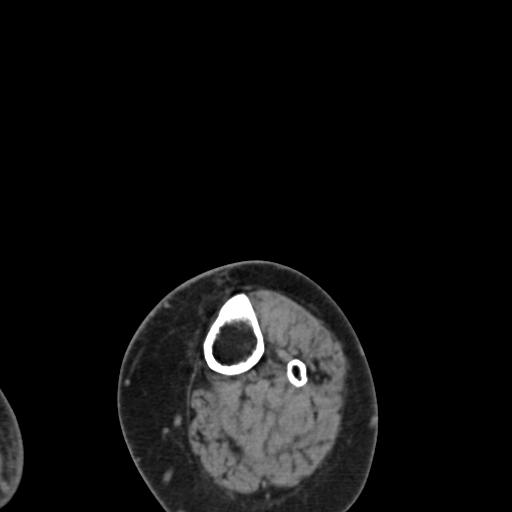
[im 4/45  bone]
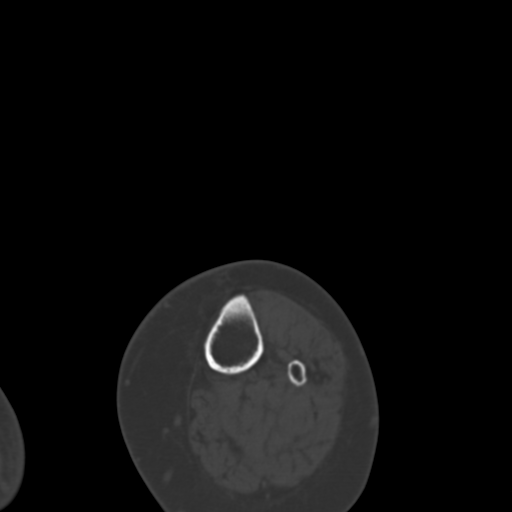
[im 11/45  bone]
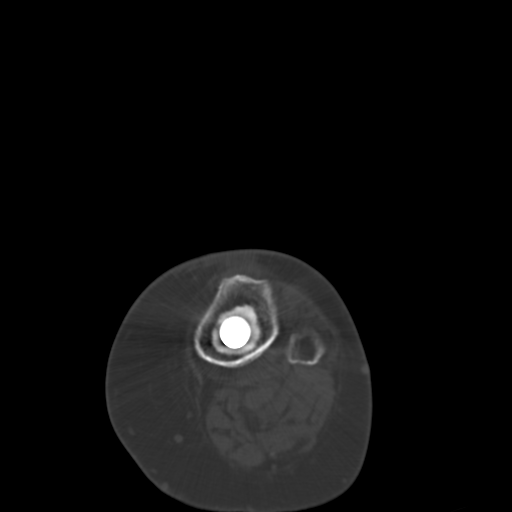
[im 14/45  bone]
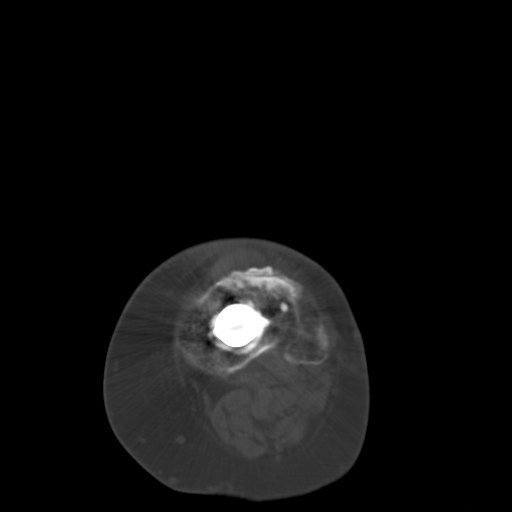
[im 21/45  bone]
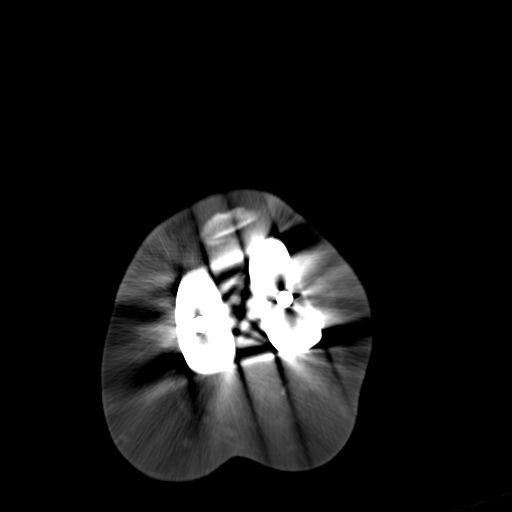
[im 24/45  soft-tissue]
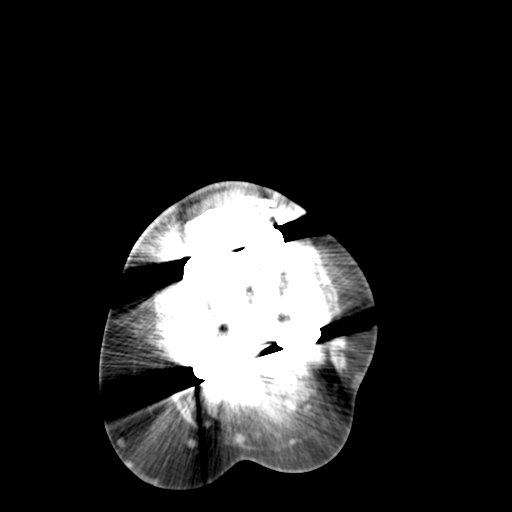
[im 24/45  bone]
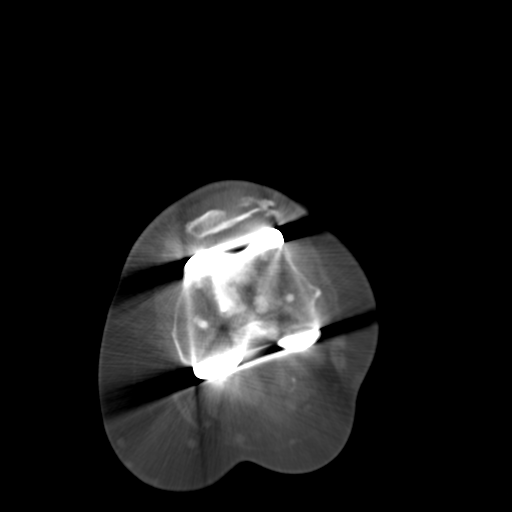
[im 31/45  bone]
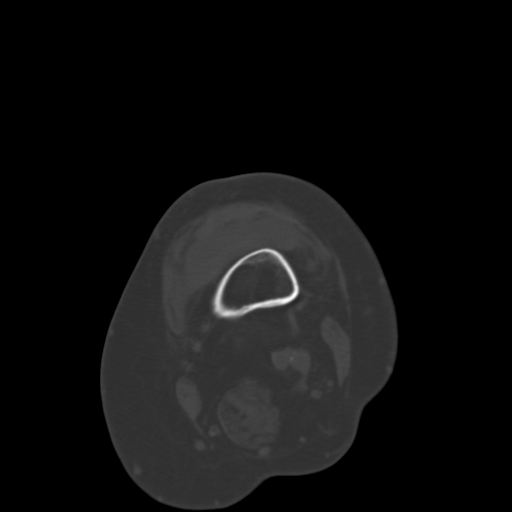
[im 34/45  bone]
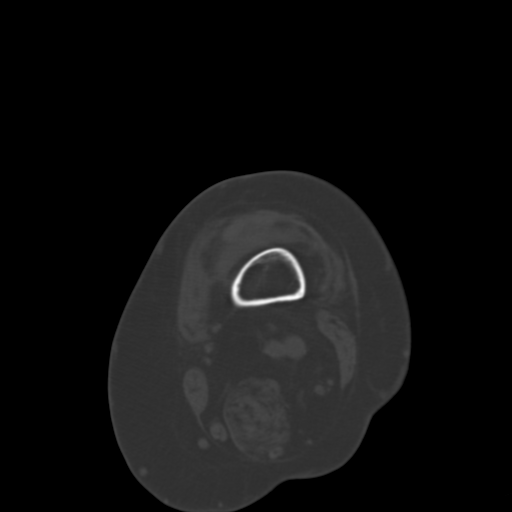
[im 41/45  bone]
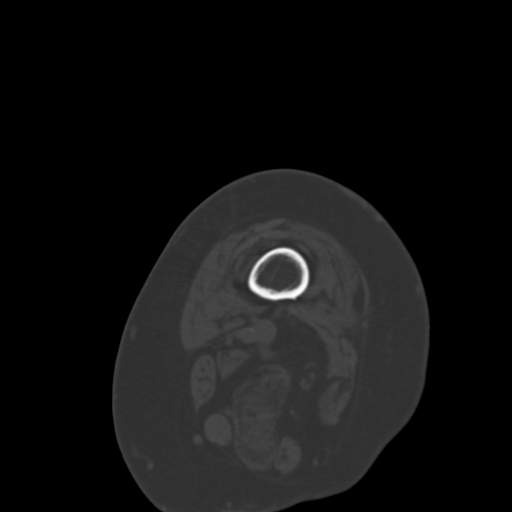

[Series 604: <mpr thick range(2)> · coronal · 0.49mm/px · 3 of 80 slices shown]
[im 16/80  bone]
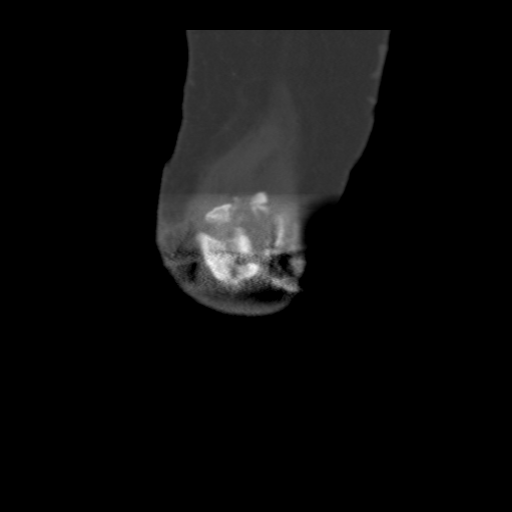
[im 32/80  bone]
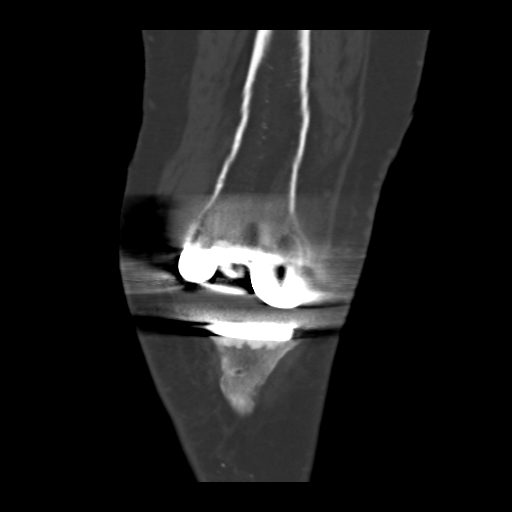
[im 48/80  bone]
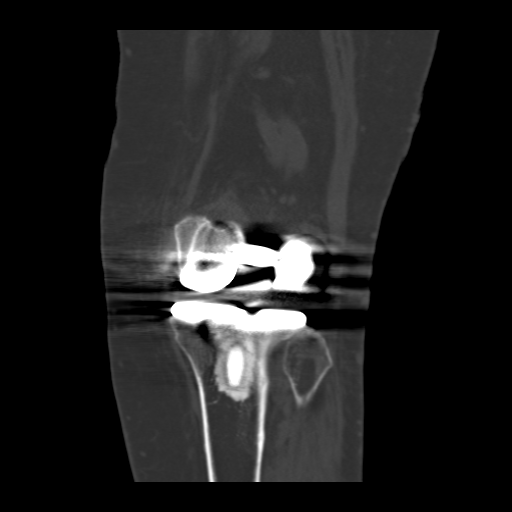

[Series 605: <mpr thick range(3)> · sagittal · 0.49mm/px · 5 of 80 slices shown, 6 images]
[im 27/80  bone]
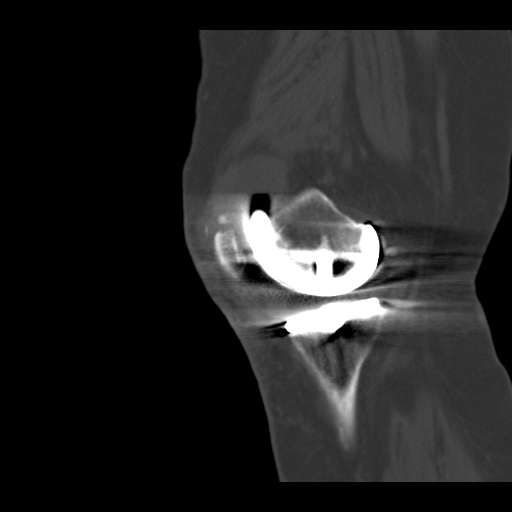
[im 33/80  bone]
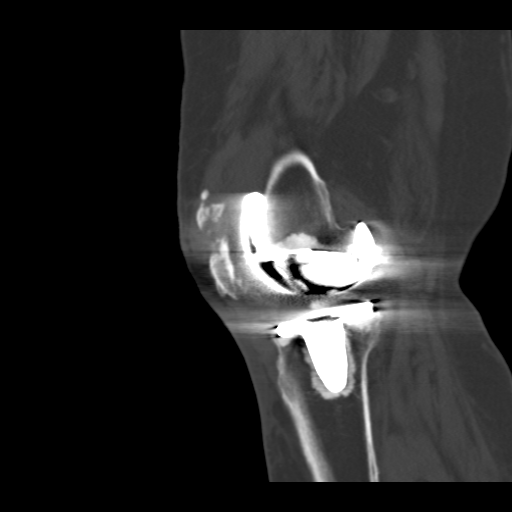
[im 40/80  soft-tissue]
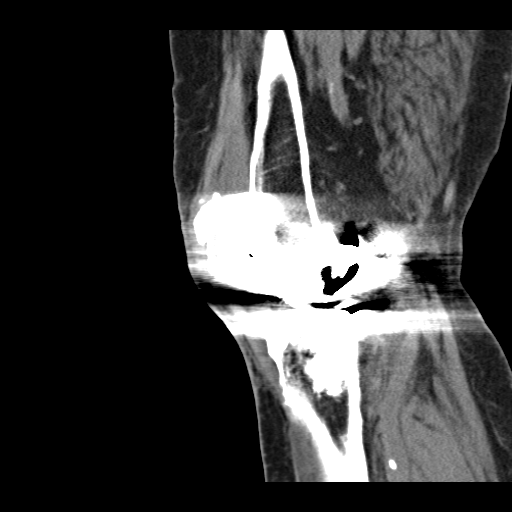
[im 40/80  bone]
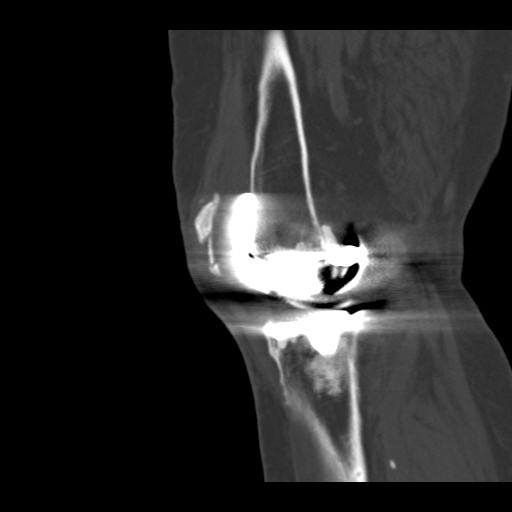
[im 47/80  bone]
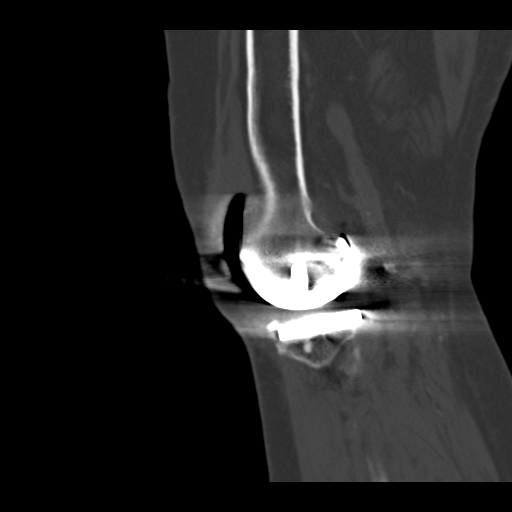
[im 53/80  bone]
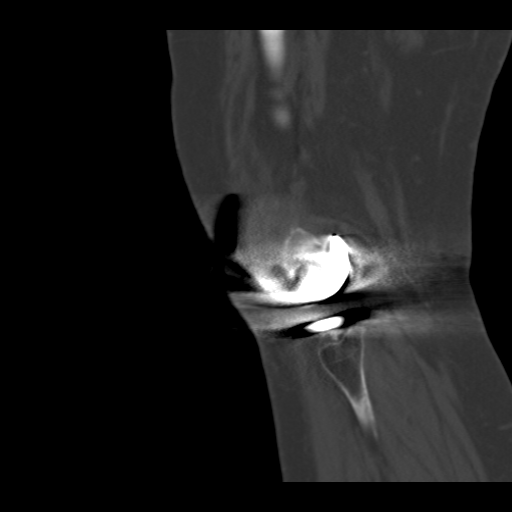

[16 of 33 positions shown; findings below may reference images not displayed]

FINDINGS: There is a comminuted fracture of the patella with approximately 12
mm of distraction between the most proximal and distal fragments.
There is a prominent joint effusion. The femoral and tibial
components of the prosthesis are in good position without evidence
of loosening. Detail is degraded by metallic artifact. There are no
appreciable fractures of the distal femur or proximal tibia or
proximal fibula.
IMPRESSION: Comminuted fracture of the patella.

Critical Value/emergent results were called by telephone by Dr.
Mubinur Mb at the time of the preliminary interpretation on
01/03/2014 at [DATE] p.m. to Dr. [HOSPITAL] Auad, RN, who verbally
acknowledged these results.

## 2016-02-23 IMAGING — CR DG SHOULDER 2+V*R*
2 series · 2 of 2 positions shown · non-contrast
Comparison: None.

CLINICAL DATA: Pain post trauma

EXAM:
RIGHT SHOULDER - 2+ VIEW

[t shoulder y view right]
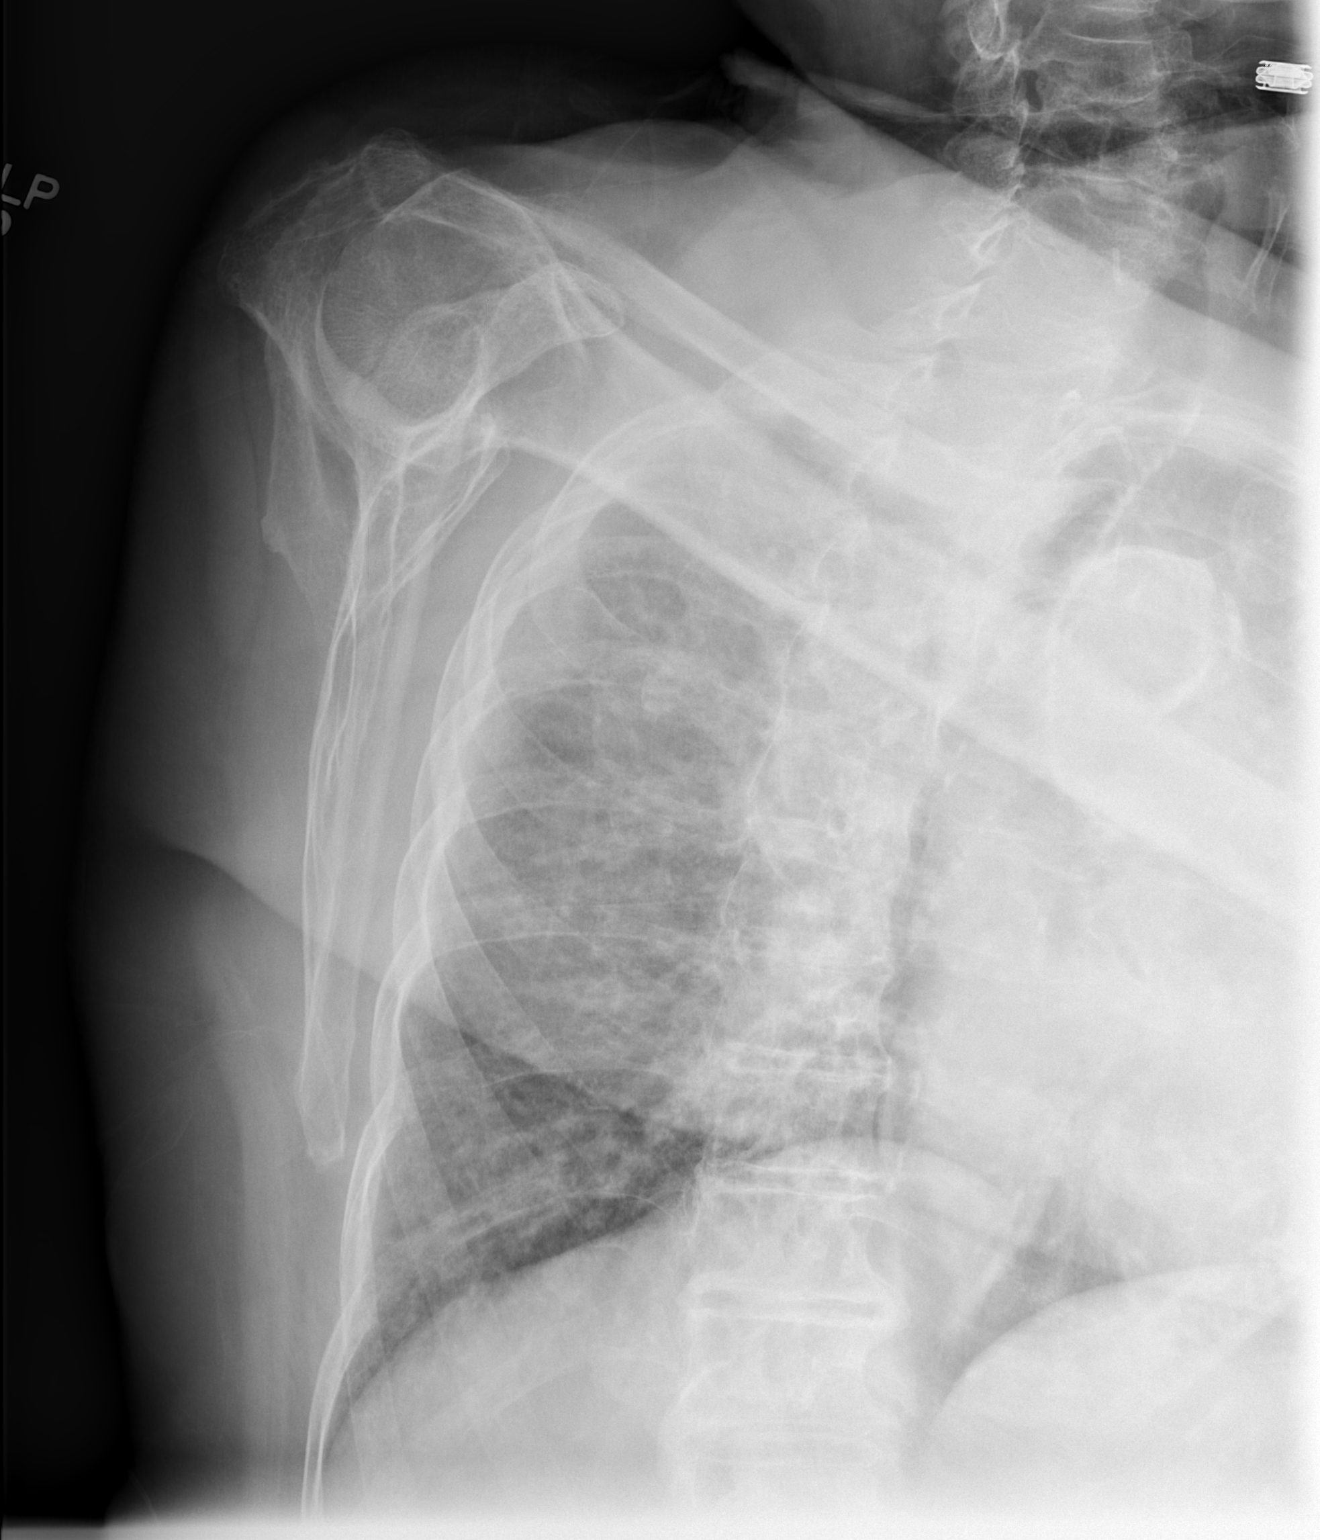

[t shoulder ap internal right]
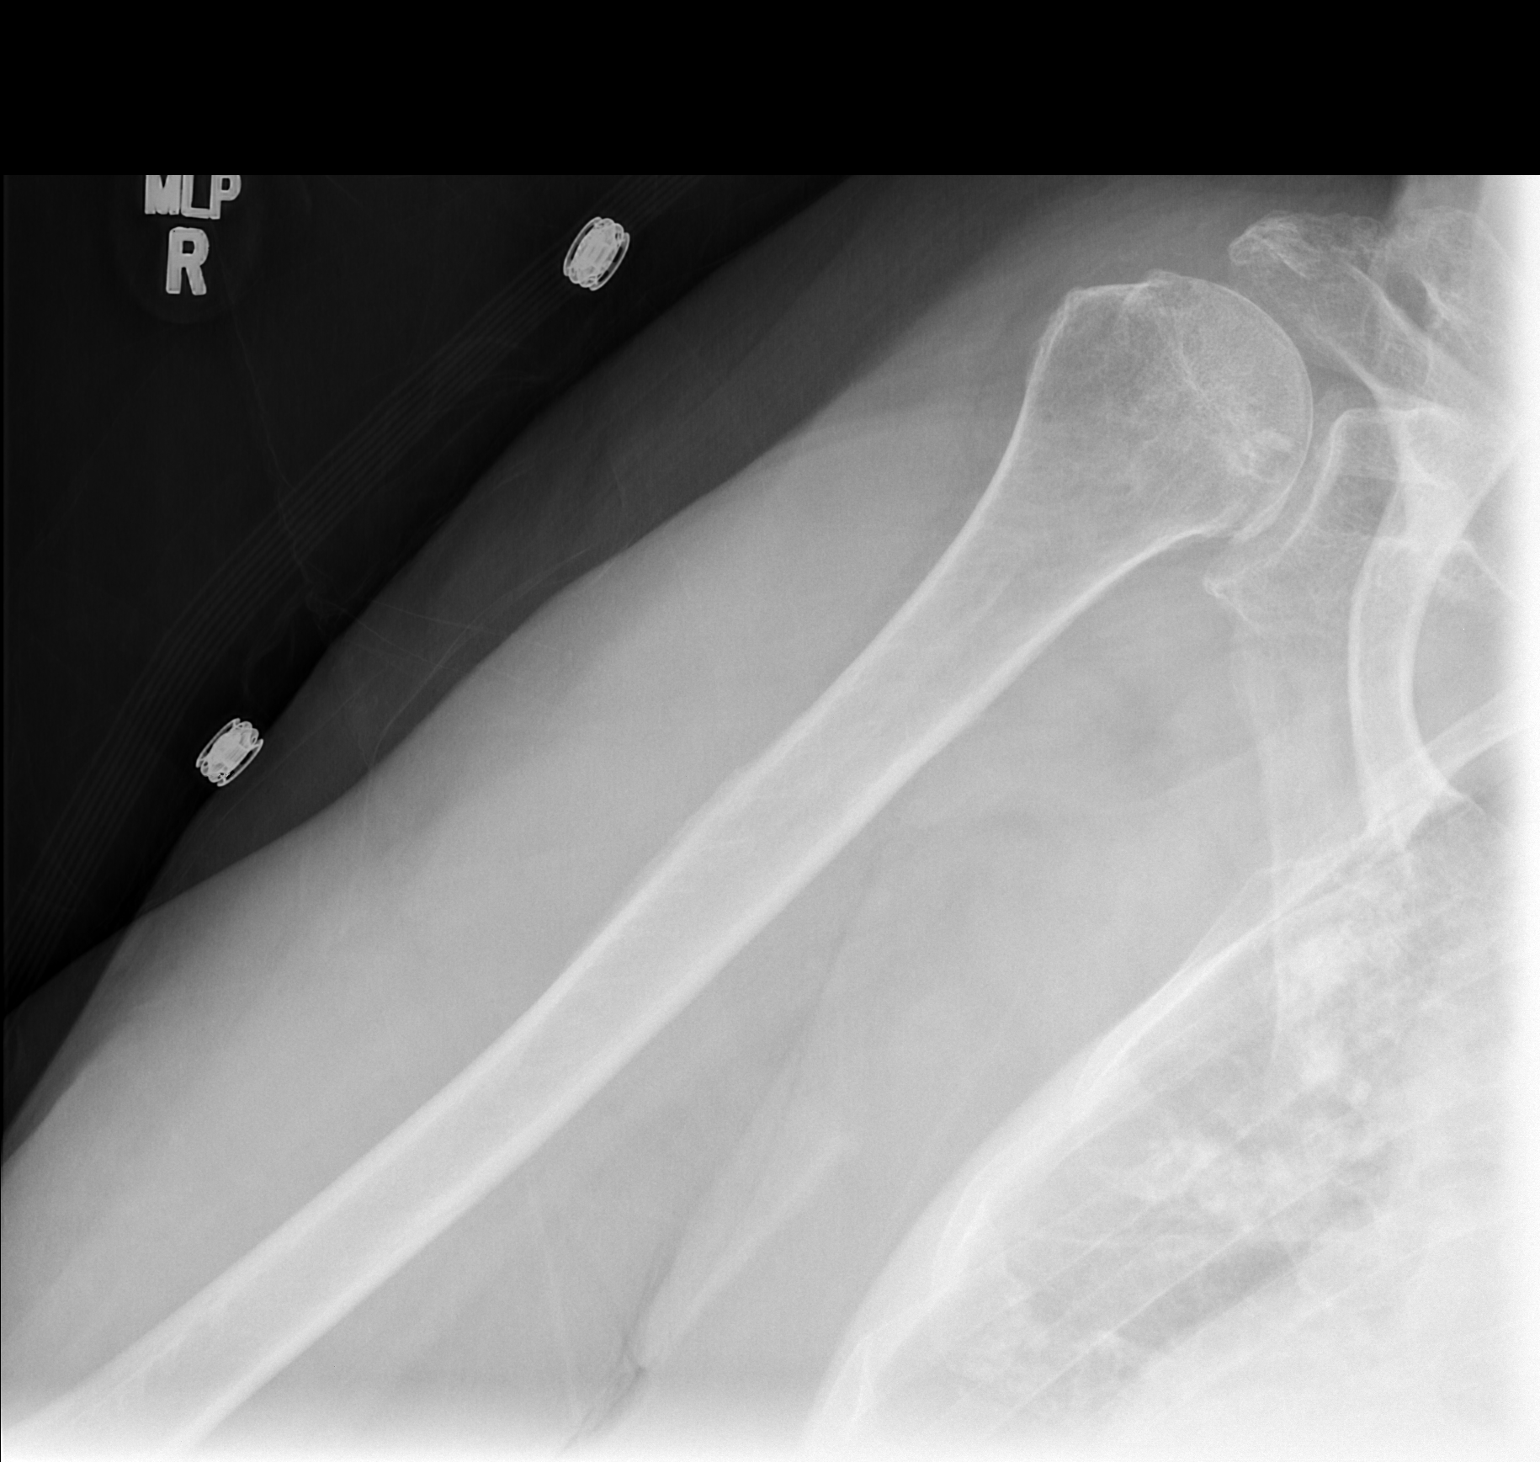

[2 of 2 positions shown; findings below may reference images not displayed]

FINDINGS: Frontal and Y scapular images were obtained. There is moderate
generalized osteoarthritic change. No acute fracture or dislocation.
No erosive change.
IMPRESSION: No apparent fracture or dislocation. Moderate generalized
osteoarthritic change.

## 2016-02-24 DIAGNOSIS — I482 Chronic atrial fibrillation: Secondary | ICD-10-CM | POA: Diagnosis not present

## 2016-02-24 DIAGNOSIS — M1711 Unilateral primary osteoarthritis, right knee: Secondary | ICD-10-CM | POA: Diagnosis not present

## 2016-02-24 DIAGNOSIS — R430 Anosmia: Secondary | ICD-10-CM | POA: Diagnosis not present

## 2016-02-25 DIAGNOSIS — M1711 Unilateral primary osteoarthritis, right knee: Secondary | ICD-10-CM | POA: Diagnosis not present

## 2016-02-25 DIAGNOSIS — I482 Chronic atrial fibrillation: Secondary | ICD-10-CM | POA: Diagnosis not present

## 2016-02-26 DIAGNOSIS — M25561 Pain in right knee: Secondary | ICD-10-CM | POA: Diagnosis not present

## 2016-02-26 DIAGNOSIS — I482 Chronic atrial fibrillation: Secondary | ICD-10-CM | POA: Diagnosis not present

## 2016-02-26 DIAGNOSIS — M1711 Unilateral primary osteoarthritis, right knee: Secondary | ICD-10-CM | POA: Diagnosis not present

## 2016-02-27 ENCOUNTER — Encounter: Payer: Medicare Other | Admitting: Neurology

## 2016-02-27 DIAGNOSIS — R42 Dizziness and giddiness: Secondary | ICD-10-CM | POA: Diagnosis not present

## 2016-02-27 DIAGNOSIS — R531 Weakness: Secondary | ICD-10-CM | POA: Diagnosis not present

## 2016-02-28 ENCOUNTER — Other Ambulatory Visit: Payer: Self-pay | Admitting: Internal Medicine

## 2016-02-28 DIAGNOSIS — R531 Weakness: Secondary | ICD-10-CM

## 2016-02-28 DIAGNOSIS — R42 Dizziness and giddiness: Secondary | ICD-10-CM

## 2016-03-02 ENCOUNTER — Ambulatory Visit
Admission: RE | Admit: 2016-03-02 | Discharge: 2016-03-02 | Disposition: A | Discharge status: Home / Self Care | Payer: Medicare Other | Source: Ambulatory Visit | Attending: Internal Medicine | Admitting: Internal Medicine

## 2016-03-02 DIAGNOSIS — R531 Weakness: Secondary | ICD-10-CM

## 2016-03-02 DIAGNOSIS — R42 Dizziness and giddiness: Secondary | ICD-10-CM | POA: Diagnosis not present

## 2016-03-03 DIAGNOSIS — I482 Chronic atrial fibrillation: Secondary | ICD-10-CM | POA: Diagnosis not present

## 2016-03-03 DIAGNOSIS — M1711 Unilateral primary osteoarthritis, right knee: Secondary | ICD-10-CM | POA: Diagnosis not present

## 2016-03-04 DIAGNOSIS — I482 Chronic atrial fibrillation: Secondary | ICD-10-CM | POA: Diagnosis not present

## 2016-03-04 DIAGNOSIS — M1711 Unilateral primary osteoarthritis, right knee: Secondary | ICD-10-CM | POA: Diagnosis not present

## 2016-03-05 DIAGNOSIS — M25561 Pain in right knee: Secondary | ICD-10-CM | POA: Diagnosis not present

## 2016-03-05 DIAGNOSIS — M1711 Unilateral primary osteoarthritis, right knee: Secondary | ICD-10-CM | POA: Diagnosis not present

## 2016-03-06 DIAGNOSIS — M1711 Unilateral primary osteoarthritis, right knee: Secondary | ICD-10-CM | POA: Diagnosis not present

## 2016-03-06 DIAGNOSIS — M6281 Muscle weakness (generalized): Secondary | ICD-10-CM | POA: Diagnosis not present

## 2016-03-06 DIAGNOSIS — R279 Unspecified lack of coordination: Secondary | ICD-10-CM | POA: Diagnosis not present

## 2016-03-09 DIAGNOSIS — M1711 Unilateral primary osteoarthritis, right knee: Secondary | ICD-10-CM | POA: Diagnosis not present

## 2016-03-09 DIAGNOSIS — I482 Chronic atrial fibrillation: Secondary | ICD-10-CM | POA: Diagnosis not present

## 2016-03-10 DIAGNOSIS — M1711 Unilateral primary osteoarthritis, right knee: Secondary | ICD-10-CM | POA: Diagnosis not present

## 2016-03-10 DIAGNOSIS — I482 Chronic atrial fibrillation: Secondary | ICD-10-CM | POA: Diagnosis not present

## 2016-03-11 DIAGNOSIS — M1711 Unilateral primary osteoarthritis, right knee: Secondary | ICD-10-CM | POA: Diagnosis not present

## 2016-03-11 DIAGNOSIS — I482 Chronic atrial fibrillation: Secondary | ICD-10-CM | POA: Diagnosis not present

## 2016-03-12 DIAGNOSIS — M1711 Unilateral primary osteoarthritis, right knee: Secondary | ICD-10-CM | POA: Diagnosis not present

## 2016-03-12 DIAGNOSIS — I482 Chronic atrial fibrillation: Secondary | ICD-10-CM | POA: Diagnosis not present

## 2016-03-13 DIAGNOSIS — M1711 Unilateral primary osteoarthritis, right knee: Secondary | ICD-10-CM | POA: Diagnosis not present

## 2016-03-16 DIAGNOSIS — M1711 Unilateral primary osteoarthritis, right knee: Secondary | ICD-10-CM | POA: Diagnosis not present

## 2016-03-16 DIAGNOSIS — I482 Chronic atrial fibrillation: Secondary | ICD-10-CM | POA: Diagnosis not present

## 2016-03-17 DIAGNOSIS — R109 Unspecified abdominal pain: Secondary | ICD-10-CM | POA: Diagnosis not present

## 2016-03-17 DIAGNOSIS — M1711 Unilateral primary osteoarthritis, right knee: Secondary | ICD-10-CM | POA: Diagnosis not present

## 2016-03-17 DIAGNOSIS — I482 Chronic atrial fibrillation: Secondary | ICD-10-CM | POA: Diagnosis not present

## 2016-03-17 DIAGNOSIS — M719 Bursopathy, unspecified: Secondary | ICD-10-CM | POA: Diagnosis not present

## 2016-03-17 DIAGNOSIS — M25552 Pain in left hip: Secondary | ICD-10-CM | POA: Diagnosis not present

## 2016-03-24 DIAGNOSIS — I482 Chronic atrial fibrillation: Secondary | ICD-10-CM | POA: Diagnosis not present

## 2016-03-24 DIAGNOSIS — M1711 Unilateral primary osteoarthritis, right knee: Secondary | ICD-10-CM | POA: Diagnosis not present

## 2016-03-27 DIAGNOSIS — I482 Chronic atrial fibrillation: Secondary | ICD-10-CM | POA: Diagnosis not present

## 2016-03-27 DIAGNOSIS — M1711 Unilateral primary osteoarthritis, right knee: Secondary | ICD-10-CM | POA: Diagnosis not present

## 2016-04-01 DIAGNOSIS — I482 Chronic atrial fibrillation: Secondary | ICD-10-CM | POA: Diagnosis not present

## 2016-04-01 DIAGNOSIS — I10 Essential (primary) hypertension: Secondary | ICD-10-CM | POA: Diagnosis not present

## 2016-04-01 DIAGNOSIS — M1711 Unilateral primary osteoarthritis, right knee: Secondary | ICD-10-CM | POA: Diagnosis not present

## 2016-04-01 DIAGNOSIS — I739 Peripheral vascular disease, unspecified: Secondary | ICD-10-CM | POA: Diagnosis not present

## 2016-04-03 DIAGNOSIS — I482 Chronic atrial fibrillation: Secondary | ICD-10-CM | POA: Diagnosis not present

## 2016-04-03 DIAGNOSIS — M1711 Unilateral primary osteoarthritis, right knee: Secondary | ICD-10-CM | POA: Diagnosis not present

## 2016-04-07 DIAGNOSIS — R430 Anosmia: Secondary | ICD-10-CM | POA: Diagnosis not present

## 2016-04-08 DIAGNOSIS — I482 Chronic atrial fibrillation: Secondary | ICD-10-CM | POA: Diagnosis not present

## 2016-04-08 DIAGNOSIS — M1711 Unilateral primary osteoarthritis, right knee: Secondary | ICD-10-CM | POA: Diagnosis not present

## 2016-04-10 DIAGNOSIS — I482 Chronic atrial fibrillation: Secondary | ICD-10-CM | POA: Diagnosis not present

## 2016-04-10 DIAGNOSIS — M1711 Unilateral primary osteoarthritis, right knee: Secondary | ICD-10-CM | POA: Diagnosis not present

## 2016-04-14 DIAGNOSIS — M1711 Unilateral primary osteoarthritis, right knee: Secondary | ICD-10-CM | POA: Diagnosis not present

## 2016-04-14 DIAGNOSIS — I482 Chronic atrial fibrillation: Secondary | ICD-10-CM | POA: Diagnosis not present

## 2016-04-21 DIAGNOSIS — M1711 Unilateral primary osteoarthritis, right knee: Secondary | ICD-10-CM | POA: Diagnosis not present

## 2016-04-21 DIAGNOSIS — I482 Chronic atrial fibrillation: Secondary | ICD-10-CM | POA: Diagnosis not present

## 2016-04-28 DIAGNOSIS — M1711 Unilateral primary osteoarthritis, right knee: Secondary | ICD-10-CM | POA: Diagnosis not present

## 2016-04-28 DIAGNOSIS — I482 Chronic atrial fibrillation: Secondary | ICD-10-CM | POA: Diagnosis not present

## 2016-04-29 DIAGNOSIS — M1711 Unilateral primary osteoarthritis, right knee: Secondary | ICD-10-CM | POA: Diagnosis not present

## 2016-04-29 DIAGNOSIS — I482 Chronic atrial fibrillation: Secondary | ICD-10-CM | POA: Diagnosis not present

## 2016-05-04 DIAGNOSIS — M1711 Unilateral primary osteoarthritis, right knee: Secondary | ICD-10-CM | POA: Diagnosis not present

## 2016-05-04 DIAGNOSIS — I482 Chronic atrial fibrillation: Secondary | ICD-10-CM | POA: Diagnosis not present

## 2016-05-05 DIAGNOSIS — R7303 Prediabetes: Secondary | ICD-10-CM | POA: Diagnosis not present

## 2016-05-05 DIAGNOSIS — E039 Hypothyroidism, unspecified: Secondary | ICD-10-CM | POA: Diagnosis not present

## 2016-05-05 DIAGNOSIS — R6889 Other general symptoms and signs: Secondary | ICD-10-CM | POA: Diagnosis not present

## 2016-05-05 DIAGNOSIS — G3184 Mild cognitive impairment, so stated: Secondary | ICD-10-CM | POA: Diagnosis not present

## 2016-05-05 DIAGNOSIS — E559 Vitamin D deficiency, unspecified: Secondary | ICD-10-CM | POA: Diagnosis not present

## 2016-05-05 DIAGNOSIS — R5383 Other fatigue: Secondary | ICD-10-CM | POA: Diagnosis not present

## 2016-05-05 DIAGNOSIS — Z23 Encounter for immunization: Secondary | ICD-10-CM | POA: Diagnosis not present

## 2016-05-06 DIAGNOSIS — I482 Chronic atrial fibrillation: Secondary | ICD-10-CM | POA: Diagnosis not present

## 2016-05-06 DIAGNOSIS — M1711 Unilateral primary osteoarthritis, right knee: Secondary | ICD-10-CM | POA: Diagnosis not present

## 2016-05-07 DIAGNOSIS — M1711 Unilateral primary osteoarthritis, right knee: Secondary | ICD-10-CM | POA: Diagnosis not present

## 2016-05-07 DIAGNOSIS — I482 Chronic atrial fibrillation: Secondary | ICD-10-CM | POA: Diagnosis not present

## 2016-05-11 DIAGNOSIS — I482 Chronic atrial fibrillation: Secondary | ICD-10-CM | POA: Diagnosis not present

## 2016-05-11 DIAGNOSIS — M1711 Unilateral primary osteoarthritis, right knee: Secondary | ICD-10-CM | POA: Diagnosis not present

## 2016-05-12 DIAGNOSIS — M1711 Unilateral primary osteoarthritis, right knee: Secondary | ICD-10-CM | POA: Diagnosis not present

## 2016-05-12 DIAGNOSIS — I482 Chronic atrial fibrillation: Secondary | ICD-10-CM | POA: Diagnosis not present

## 2016-05-15 DIAGNOSIS — I482 Chronic atrial fibrillation: Secondary | ICD-10-CM | POA: Diagnosis not present

## 2016-05-15 DIAGNOSIS — M1711 Unilateral primary osteoarthritis, right knee: Secondary | ICD-10-CM | POA: Diagnosis not present

## 2016-05-16 DIAGNOSIS — M1711 Unilateral primary osteoarthritis, right knee: Secondary | ICD-10-CM | POA: Diagnosis not present

## 2016-05-16 DIAGNOSIS — N390 Urinary tract infection, site not specified: Secondary | ICD-10-CM | POA: Diagnosis not present

## 2016-05-22 DIAGNOSIS — M1711 Unilateral primary osteoarthritis, right knee: Secondary | ICD-10-CM | POA: Diagnosis not present

## 2016-05-22 DIAGNOSIS — N390 Urinary tract infection, site not specified: Secondary | ICD-10-CM | POA: Diagnosis not present

## 2016-05-26 DIAGNOSIS — F329 Major depressive disorder, single episode, unspecified: Secondary | ICD-10-CM | POA: Diagnosis not present

## 2016-05-26 DIAGNOSIS — N390 Urinary tract infection, site not specified: Secondary | ICD-10-CM | POA: Diagnosis not present

## 2016-05-26 DIAGNOSIS — R6889 Other general symptoms and signs: Secondary | ICD-10-CM | POA: Diagnosis not present

## 2016-05-29 DIAGNOSIS — M1711 Unilateral primary osteoarthritis, right knee: Secondary | ICD-10-CM | POA: Diagnosis not present

## 2016-05-29 DIAGNOSIS — N390 Urinary tract infection, site not specified: Secondary | ICD-10-CM | POA: Diagnosis not present

## 2016-06-04 DIAGNOSIS — M1711 Unilateral primary osteoarthritis, right knee: Secondary | ICD-10-CM | POA: Diagnosis not present

## 2016-06-04 DIAGNOSIS — N390 Urinary tract infection, site not specified: Secondary | ICD-10-CM | POA: Diagnosis not present

## 2016-06-05 DIAGNOSIS — R638 Other symptoms and signs concerning food and fluid intake: Secondary | ICD-10-CM | POA: Diagnosis not present

## 2016-06-08 DIAGNOSIS — N390 Urinary tract infection, site not specified: Secondary | ICD-10-CM | POA: Diagnosis not present

## 2016-06-12 DIAGNOSIS — N390 Urinary tract infection, site not specified: Secondary | ICD-10-CM | POA: Diagnosis not present

## 2016-06-12 DIAGNOSIS — M1711 Unilateral primary osteoarthritis, right knee: Secondary | ICD-10-CM | POA: Diagnosis not present

## 2016-06-23 DIAGNOSIS — Z96652 Presence of left artificial knee joint: Secondary | ICD-10-CM | POA: Diagnosis not present

## 2016-06-23 DIAGNOSIS — Z8673 Personal history of transient ischemic attack (TIA), and cerebral infarction without residual deficits: Secondary | ICD-10-CM | POA: Diagnosis not present

## 2016-06-23 DIAGNOSIS — Z7902 Long term (current) use of antithrombotics/antiplatelets: Secondary | ICD-10-CM | POA: Diagnosis not present

## 2016-06-23 DIAGNOSIS — Z96642 Presence of left artificial hip joint: Secondary | ICD-10-CM | POA: Diagnosis not present

## 2016-07-02 DIAGNOSIS — Z0000 Encounter for general adult medical examination without abnormal findings: Secondary | ICD-10-CM | POA: Diagnosis not present

## 2016-07-02 DIAGNOSIS — E785 Hyperlipidemia, unspecified: Secondary | ICD-10-CM | POA: Diagnosis not present

## 2016-07-07 ENCOUNTER — Other Ambulatory Visit: Payer: Self-pay | Admitting: Internal Medicine

## 2016-07-07 DIAGNOSIS — M25551 Pain in right hip: Secondary | ICD-10-CM | POA: Diagnosis not present

## 2016-07-07 DIAGNOSIS — E039 Hypothyroidism, unspecified: Secondary | ICD-10-CM | POA: Diagnosis not present

## 2016-07-07 DIAGNOSIS — R32 Unspecified urinary incontinence: Secondary | ICD-10-CM | POA: Diagnosis not present

## 2016-07-07 DIAGNOSIS — N6320 Unspecified lump in the left breast, unspecified quadrant: Secondary | ICD-10-CM

## 2016-07-07 DIAGNOSIS — Z23 Encounter for immunization: Secondary | ICD-10-CM | POA: Diagnosis not present

## 2016-07-07 DIAGNOSIS — M810 Age-related osteoporosis without current pathological fracture: Secondary | ICD-10-CM | POA: Diagnosis not present

## 2016-07-07 DIAGNOSIS — E785 Hyperlipidemia, unspecified: Secondary | ICD-10-CM | POA: Diagnosis not present

## 2016-07-07 DIAGNOSIS — Z79899 Other long term (current) drug therapy: Secondary | ICD-10-CM | POA: Diagnosis not present

## 2016-07-15 ENCOUNTER — Ambulatory Visit
Admission: RE | Admit: 2016-07-15 | Discharge: 2016-07-15 | Disposition: A | Discharge status: Home / Self Care | Payer: Medicare Other | Source: Ambulatory Visit | Attending: Internal Medicine | Admitting: Internal Medicine

## 2016-07-15 ENCOUNTER — Other Ambulatory Visit: Payer: Self-pay | Admitting: Internal Medicine

## 2016-07-15 DIAGNOSIS — N6321 Unspecified lump in the left breast, upper outer quadrant: Secondary | ICD-10-CM | POA: Diagnosis not present

## 2016-07-15 DIAGNOSIS — N6320 Unspecified lump in the left breast, unspecified quadrant: Secondary | ICD-10-CM

## 2016-07-23 ENCOUNTER — Ambulatory Visit: Payer: Medicare Other | Admitting: Neurology

## 2016-07-23 ENCOUNTER — Encounter: Payer: Self-pay | Admitting: Neurology

## 2016-07-23 DIAGNOSIS — M25561 Pain in right knee: Secondary | ICD-10-CM | POA: Diagnosis not present

## 2016-07-23 DIAGNOSIS — R6889 Other general symptoms and signs: Secondary | ICD-10-CM | POA: Diagnosis not present

## 2016-07-23 DIAGNOSIS — M170 Bilateral primary osteoarthritis of knee: Secondary | ICD-10-CM | POA: Diagnosis not present

## 2016-07-24 DIAGNOSIS — R5383 Other fatigue: Secondary | ICD-10-CM | POA: Diagnosis not present

## 2016-07-24 DIAGNOSIS — Z9181 History of falling: Secondary | ICD-10-CM | POA: Diagnosis not present

## 2016-07-24 DIAGNOSIS — Z87440 Personal history of urinary (tract) infections: Secondary | ICD-10-CM | POA: Diagnosis not present

## 2016-07-24 DIAGNOSIS — M25552 Pain in left hip: Secondary | ICD-10-CM | POA: Diagnosis not present

## 2016-07-24 DIAGNOSIS — S79912A Unspecified injury of left hip, initial encounter: Secondary | ICD-10-CM | POA: Diagnosis not present

## 2016-07-26 DIAGNOSIS — N390 Urinary tract infection, site not specified: Secondary | ICD-10-CM | POA: Diagnosis not present

## 2016-07-26 DIAGNOSIS — M25562 Pain in left knee: Secondary | ICD-10-CM | POA: Diagnosis not present

## 2016-07-28 ENCOUNTER — Encounter (HOSPITAL_COMMUNITY): Payer: Self-pay | Admitting: Emergency Medicine

## 2016-07-28 ENCOUNTER — Emergency Department (HOSPITAL_COMMUNITY)
Admission: EM | Admit: 2016-07-28 | Discharge: 2016-07-28 | Disposition: A | Discharge status: Home / Self Care | Payer: Medicare Other | Attending: Emergency Medicine | Admitting: Emergency Medicine

## 2016-07-28 DIAGNOSIS — K649 Unspecified hemorrhoids: Secondary | ICD-10-CM | POA: Diagnosis not present

## 2016-07-28 DIAGNOSIS — M25562 Pain in left knee: Secondary | ICD-10-CM | POA: Diagnosis not present

## 2016-07-28 DIAGNOSIS — Z7901 Long term (current) use of anticoagulants: Secondary | ICD-10-CM | POA: Diagnosis not present

## 2016-07-28 DIAGNOSIS — I10 Essential (primary) hypertension: Secondary | ICD-10-CM | POA: Insufficient documentation

## 2016-07-28 DIAGNOSIS — R110 Nausea: Secondary | ICD-10-CM | POA: Diagnosis not present

## 2016-07-28 DIAGNOSIS — E119 Type 2 diabetes mellitus without complications: Secondary | ICD-10-CM | POA: Insufficient documentation

## 2016-07-28 DIAGNOSIS — E039 Hypothyroidism, unspecified: Secondary | ICD-10-CM | POA: Insufficient documentation

## 2016-07-28 DIAGNOSIS — R0902 Hypoxemia: Secondary | ICD-10-CM | POA: Diagnosis not present

## 2016-07-28 DIAGNOSIS — K922 Gastrointestinal hemorrhage, unspecified: Secondary | ICD-10-CM

## 2016-07-28 DIAGNOSIS — Z96652 Presence of left artificial knee joint: Secondary | ICD-10-CM | POA: Diagnosis not present

## 2016-07-28 DIAGNOSIS — Z8673 Personal history of transient ischemic attack (TIA), and cerebral infarction without residual deficits: Secondary | ICD-10-CM | POA: Insufficient documentation

## 2016-07-28 DIAGNOSIS — Z96641 Presence of right artificial hip joint: Secondary | ICD-10-CM | POA: Diagnosis not present

## 2016-07-28 DIAGNOSIS — N390 Urinary tract infection, site not specified: Secondary | ICD-10-CM | POA: Diagnosis not present

## 2016-07-28 DIAGNOSIS — R195 Other fecal abnormalities: Secondary | ICD-10-CM | POA: Diagnosis not present

## 2016-07-28 LAB — COMPREHENSIVE METABOLIC PANEL
ALT: 32 U/L (ref 14–54)
AST: 35 U/L (ref 15–41)
Albumin: 4 g/dL (ref 3.5–5.0)
Alkaline Phosphatase: 86 U/L (ref 38–126)
Anion gap: 13 (ref 5–15)
BILIRUBIN TOTAL: 0.8 mg/dL (ref 0.3–1.2)
BUN: 27 mg/dL — AB (ref 6–20)
CALCIUM: 9.4 mg/dL (ref 8.9–10.3)
CO2: 22 mmol/L (ref 22–32)
CREATININE: 1.03 mg/dL — AB (ref 0.44–1.00)
Chloride: 103 mmol/L (ref 101–111)
GFR, EST AFRICAN AMERICAN: 55 mL/min — AB (ref >60)
GFR, EST NON AFRICAN AMERICAN: 47 mL/min — AB (ref >60)
Glucose, Bld: 121 mg/dL — ABNORMAL HIGH (ref 65–99)
Potassium: 3.7 mmol/L (ref 3.5–5.1)
Sodium: 138 mmol/L (ref 135–145)
TOTAL PROTEIN: 7 g/dL (ref 6.5–8.1)

## 2016-07-28 LAB — CBC WITH DIFFERENTIAL/PLATELET
BASOS ABS: 0 10*3/uL (ref 0.0–0.1)
Basophils Relative: 0 %
Eosinophils Absolute: 0.2 10*3/uL (ref 0.0–0.7)
Eosinophils Relative: 1 %
HEMATOCRIT: 47 % — AB (ref 36.0–46.0)
Hemoglobin: 15.6 g/dL — ABNORMAL HIGH (ref 12.0–15.0)
LYMPHS ABS: 0.8 10*3/uL (ref 0.7–4.0)
LYMPHS PCT: 4 %
MCH: 30.5 pg (ref 26.0–34.0)
MCHC: 33.2 g/dL (ref 30.0–36.0)
MCV: 91.8 fL (ref 78.0–100.0)
MONO ABS: 1.2 10*3/uL — AB (ref 0.1–1.0)
Monocytes Relative: 6 %
NEUTROS ABS: 16.1 10*3/uL — AB (ref 1.7–7.7)
Neutrophils Relative %: 89 %
Platelets: 284 10*3/uL (ref 150–400)
RBC: 5.12 MIL/uL — AB (ref 3.87–5.11)
RDW: 13.6 % (ref 11.5–15.5)
WBC: 18.3 10*3/uL — AB (ref 4.0–10.5)

## 2016-07-28 LAB — URINALYSIS, ROUTINE W REFLEX MICROSCOPIC
Bilirubin Urine: NEGATIVE
GLUCOSE, UA: NEGATIVE mg/dL
Hgb urine dipstick: NEGATIVE
KETONES UR: NEGATIVE mg/dL
LEUKOCYTES UA: NEGATIVE
Nitrite: NEGATIVE
PROTEIN: NEGATIVE mg/dL
Specific Gravity, Urine: 1.014 (ref 1.005–1.030)
pH: 6 (ref 5.0–8.0)

## 2016-07-28 LAB — POC OCCULT BLOOD, ED: Fecal Occult Bld: POSITIVE — AB

## 2016-07-28 LAB — HEMOGLOBIN AND HEMATOCRIT, BLOOD
HCT: 43.7 % (ref 36.0–46.0)
Hemoglobin: 14.5 g/dL (ref 12.0–15.0)

## 2016-07-28 LAB — LIPASE, BLOOD: LIPASE: 18 U/L (ref 11–51)

## 2016-07-28 MED ORDER — PANTOPRAZOLE SODIUM 40 MG IV SOLR
40.0000 mg | Freq: Once | INTRAVENOUS | Status: AC
  Administered 2016-07-28: 40 mg via INTRAVENOUS
  Filled 2016-07-28: qty 40

## 2016-07-28 MED ORDER — SODIUM CHLORIDE 0.9 % IV BOLUS (SEPSIS): 500.0000 mL | Freq: Once | INTRAVENOUS | Status: DC

## 2016-07-28 MED ORDER — HYDROCORTISONE 2.5 % RE CREA: TOPICAL_CREAM | RECTAL | 0 refills | Status: DC

## 2016-07-28 MED ORDER — POLYETHYLENE GLYCOL 3350 17 GM/SCOOP PO POWD: 1.0000 | Freq: Once | ORAL | 0 refills | Status: AC

## 2016-07-28 MED ORDER — SODIUM CHLORIDE 0.9 % IV SOLN: Freq: Once | INTRAVENOUS | Status: DC

## 2016-07-28 MED ORDER — SODIUM CHLORIDE 0.9 % IV BOLUS (SEPSIS)
500.0000 mL | Freq: Once | INTRAVENOUS | Status: AC
  Administered 2016-07-28: 500 mL via INTRAVENOUS

## 2016-07-28 NOTE — Discharge Instructions (Signed)
Take miralax twice daily until you are having liquid stools Use Anusol for hemorrhoids Follow up with GI doctor

## 2016-07-28 NOTE — ED Provider Notes (Signed)
Makayla GreaserJoan Thomas is a 82 y.o. female Patient signed out to me at shift change pending recheck hemoglobin and urinalysis. Patient presented to emergency department complaining of GI bleed. She is found to have elevated white blood cell count, normal hemoglobin. Patient was found to be stable and did not meet requirements for admission. Her repeat hemoglobin is 14.5, down from 15.6, but patient did receive some fluids while in the emergency department as well. Her urinalysis is normal. She was orthostatic, and did receive fluids, and states she will go home and drink plenty of water. Patient states she is ready to be discharged home, and asked for discharge papers. At time of discharge, she is in no acute distress. Cheerful. Normal VS. No abdominal pain, abd benign. Return precautions discussed.   Vitals:   07/28/16 1600 07/28/16 1623 07/28/16 1700 07/28/16 1800  BP: 128/69 120/87 142/79 139/77  Pulse: 63 72 63 64  Resp:  16    Temp:      TempSrc:      SpO2: 96% 97% 93% 94%      Jaynie Crumbleatyana Miguel Christiana, PA-C 07/28/16 2325    Jacalyn LefevreJulie Haviland, MD 07/28/16 2340

## 2016-07-28 NOTE — ED Triage Notes (Addendum)
Per EMS: pt from assisted living at [facility] c/o episode x 1 black stool with some red stool afterwards; pt sts recent hx of constipation; pt on xerelto

## 2016-07-28 NOTE — Progress Notes (Signed)
Patient is from [place]. CSW following for disposition and return to facility once medically appropriate.          Enos Fling, MSW, LCSW [facility] ED/10M Clinical Social Worker [phone]

## 2016-07-28 NOTE — ED Provider Notes (Signed)
[facility]-EMERGENCY DEPT Provider Note   CSN: [id number] Arrival date & time: 07/28/16  1209    History   Chief Complaint Chief Complaint  Patient presents with  . GI Bleeding    HPI Makayla Thomas is a 82 y.o. female who presents with GIB. PMH significant for A. Fib on anticoagulation (Xarelto), Aortic stenosis, HTN, hypothyroidism. She lives at an independent living facitlity. She states she has been constipated and hasn't had a bowel movement in the past 5 days. She took laxatives. Today she got up to eat breakfast and was able to eat and then experienced some lower abdominal cramping. She felt like she had to go the bathroom therefore she went there and started to have vomiting. And dry heaves. She then proceeded to have a bowel movement and black stools mixed with bright red blood. She had a recent fall on Thursday - had xrays which were negative. She has had a colonoscopy and endoscopy which were unremarkable. Past surgical history remarkable for cholecystectomy, 3 C-sections. She called the doctor of the nursing home who advised her to come to the emergency room. Currently she is experiencing mild abdominal cramping and nausea. She denies fever, chills, weakness, lightheadedness, chest pain, shortness of breath. She is also being treated with Amoxicillin for UTI due to urinary frequency currently.  HPI  Past Medical History:  Diagnosis Date  . Anemia   . Aortic stenosis, moderate (was mild) 08/2009; 08/2014   a. ECHO  EF 55-60%, wth increased EDP, mild AS;; b. mild Conc LVH, EF 60-65%, DD with high LVEDP, MIld-Mod AS, Mod MAC with Mod MR, mild-mod PA HTN.  [name]. Arthritis   . Borderline diabetes   . Chronic anticoagulation    on Xarelto  . Complication of anesthesia    hallusinations  . Constipation   . Diabetes mellitus without complication (HCC)    possibly  . Dyslipidemia    treated  . Dysrhythmia    A-FIB  . H/O cardiovascular stress test 05/2010   Lexiscan cardiolite no  ischemia or infarction  . H/O multiple sclerosis    no excaerbations in some time  . History of ETT 02/2011   Naughton exercise protocol, negative with poor exercise tolerance  . History of transfusion   . Hypertension   . Hypothyroidism   . Lower extremity edema    chronic  . PAD (peripheral artery disease) (HCC) 08/30/2009   Dopplers; with bil. post. tib artery occlusion  . Permanent atrial fibrillation (HCC)   . Transient ischemic attack (TIA)    NO RESIDUAL PROBLEMS  . Vaginal itching   . Visual disturbances    "FLASHING IN MY EYES"    Patient Active Problem List   Diagnosis Date Noted  . Periprosthetic fracture around internal prosthetic right hip joint (HCC) 02/05/2015  . Hypothyroidism 02/05/2015  . Obese 01/30/2015  . S/P right THA, AA 01/29/2015  . Rectal bleeding   . Chronic a-fib (HCC)   . Anticoagulation adequate   . Stroke (HCC)   . Dehydration 08/29/2014  . Nausea and vomiting 08/29/2014  . Diarrhea 08/29/2014  . Hypokalemia 08/29/2014  . Acute renal failure (HCC) 08/29/2014  . Leukocytosis 08/29/2014  . History of TIA (transient ischemic attack) 08/18/2014  . Essential hypertension -well controlled 01/10/2013  . Permanent atrial fibrillation (HCC)   . Chronic anticoagulation   . Dyslipidemia, goal LDL below 100 - due aortic stenosis   . Moderate aortic stenosis 08/17/2009    Past Surgical History:  Procedure Laterality Date  .  BREAST SURGERY     L tumor removed - benign  . CESAREAN SECTION     hx of 3  . CHOLECYSTECTOMY    . JOINT REPLACEMENT  2012   left total knee  . ORIF PERIPROSTHETIC FRACTURE Right 02/07/2015   Procedure: OPEN REDUCTION INTERNAL FIXATION (ORIF) PERIPROSTHETIC FRACTURE WITH TOTAL HIP REVISION AND CABLES;  Surgeon: Durene Romans, MD;  Location: [facility];  Service: Orthopedics;  Laterality: Right;  . throidectomy partial     lt  . TOTAL HIP ARTHROPLASTY Right 01/29/2015   Procedure: RIGHT TOTAL HIP ARTHROPLASTY ANTERIOR APPROACH;   Surgeon: Durene Romans, MD;  Location: [facility];  Service: Orthopedics;  Laterality: Right;    OB History    No data available       Home Medications    Prior to Admission medications   Medication Sig Start Date End Date Taking? Authorizing Provider  acetaminophen (TYLENOL) 325 MG tablet Take 1-2 tablets (325-650 mg total) by mouth every 6 (six) hours as needed for moderate pain. 02/01/15   Lanney Gins, PA-C  bisoprolol (ZEBETA) 10 MG tablet Take 1 tablet (10 mg total) by mouth daily. 12/16/15   Marykay Lex, MD  CARTIA XT 120 MG 24 hr capsule TAKE ONE CAPSULE BY MOUTH AT BEDTIME 01/09/16   Marykay Lex, MD  cholecalciferol (VITAMIN D) 1000 UNITS tablet Take 1,000 Units by mouth every evening.     Historical Provider, MD  furosemide (LASIX) 20 MG tablet Take 20 mg by mouth every morning.    Historical Provider, MD  levothyroxine (SYNTHROID, LEVOTHROID) 125 MCG tablet Take 125 mcg by mouth daily before breakfast.    Historical Provider, MD  Rivaroxaban (XARELTO) 15 MG TABS tablet Take 1 tablet (15 mg total) by mouth daily with breakfast. 02/12/15   Kathlen Mody, MD  triamterene-hydrochlorothiazide (MAXZIDE-25) 37.5-25 MG per tablet Take 1 tablet by mouth every morning.    Historical Provider, MD    Family History Family History  Problem Relation Age of Onset  . Heart failure Father 24  . Lung cancer Father     Social History Social History  Substance Use Topics  . Smoking status: Never Smoker  . Smokeless tobacco: Never Used  . Alcohol use No     Allergies   Tetanus antitoxin; Meclizine; and Tramadol   Review of Systems Review of Systems  Constitutional: Negative for chills, fatigue and fever.  Respiratory: Negative for shortness of breath.   Cardiovascular: Negative for chest pain.  Gastrointestinal: Positive for abdominal pain, constipation, nausea and vomiting. Negative for diarrhea.  Genitourinary: Positive for frequency. Negative for dysuria, vaginal bleeding and  vaginal discharge.  Neurological: Negative for weakness and light-headedness.  All other systems reviewed and are negative.    Physical Exam Updated Vital Signs There were no vitals taken for this visit.  Physical Exam  Constitutional: She is oriented to person, place, and time. She appears well-developed and well-nourished. No distress.  Pleasant, NAD  HENT:  Head: Normocephalic and atraumatic.  Eyes: Conjunctivae are normal. Pupils are equal, round, and reactive to light. Right eye exhibits no discharge. Left eye exhibits no discharge. No scleral icterus.  Neck: Normal range of motion.  Cardiovascular: Normal rate and regular rhythm.  Exam reveals no gallop and no friction rub.   No murmur heard. Pulmonary/Chest: Effort normal and breath sounds normal. No respiratory distress. She has no wheezes. She has no rales. She exhibits tenderness.  Right lower rib tenderness  Abdominal: Soft. Bowel sounds are  normal. She exhibits no distension and no mass. There is no tenderness. There is no rebound and no guarding. No hernia.  Genitourinary:  Genitourinary Comments: Rectal: Large, non-thrombosed external hemorrhoid which is non-tender. No gross blood, fissures, redness, area of fluctuance, lesions, or tenderness. Chaperone present during exam.   Neurological: She is alert and oriented to person, place, and time.  Skin: Skin is warm and dry.  Psychiatric: She has a normal mood and affect. Her behavior is normal.  Nursing note and vitals reviewed.    ED Treatments / Results  Labs (all labs ordered are listed, but only abnormal results are displayed) Labs Reviewed  CBC WITH DIFFERENTIAL/PLATELET - Abnormal; Notable for the following:       Result Value   WBC 18.3 (*)    RBC 5.12 (*)    Hemoglobin 15.6 (*)    HCT 47.0 (*)    Neutro Abs 16.1 (*)    Monocytes Absolute 1.2 (*)    All other components within normal limits  COMPREHENSIVE METABOLIC PANEL - Abnormal; Notable for the  following:    Glucose, Bld 121 (*)    BUN 27 (*)    Creatinine, Ser 1.03 (*)    GFR calc non Af Amer 47 (*)    GFR calc Af Amer 55 (*)    All other components within normal limits  POC OCCULT BLOOD, ED - Abnormal; Notable for the following:    Fecal Occult Bld POSITIVE (*)    All other components within normal limits  LIPASE, BLOOD  URINALYSIS, ROUTINE W REFLEX MICROSCOPIC  TYPE AND SCREEN    EKG  EKG Interpretation None       Radiology No results found.  Procedures Procedures (including critical care time)  Medications Ordered in ED Medications  sodium chloride 0.9 % bolus 500 mL (0 mLs Intravenous Stopped 07/28/16 1820)  pantoprazole (PROTONIX) injection 40 mg (40 mg Intravenous Given 07/28/16 1435)     Initial Impression / Assessment and Plan / ED Course  I have reviewed the triage vital signs and the nursing notes.  Pertinent labs & imaging results that were available during my care of the patient were reviewed by me and considered in my medical decision making (see chart for details).  Clinical Course    82 year old female with hematochezia and hemorrhoids. Blood pressure is soft on arrival. 500cc IVF given with good improvement. Orthostatics are remarkable for drop in BP but patient is asymptomatic. All other vitals are WNL. CBC remarkable for leukocytosis of 18.3. Hgb is 15.6. CMP overall unremarkable. Abdominal exam is benign, non-tender. Hemoccult is positive but stool appeared brown.   Spoke with Dr. Konrad Dolores with hospitalist service who advised outpatient follow up. Spoke with GI who recommends the same. They advised Miralax BID, Anusol for hemorrhoids, and GI outpatient follow up. Discussed with patient who is in agreement with plan.   UA is still pending. Will sign patient out to T Kirichenko pending results. Anticipate d/c.   Final Clinical Impressions(s) / ED Diagnoses   Final diagnoses:  Lower GI bleed  Hemorrhoids, unspecified hemorrhoid type     New Prescriptions Discharge Medication List as of 07/28/2016  7:07 PM    START taking these medications   Details  hydrocortisone (ANUSOL-HC) 2.5 % rectal cream Apply rectally 2 times daily, Print    polyethylene glycol powder (GLYCOLAX/MIRALAX) powder Take 255 g by mouth once., Starting Tue 07/28/2016, Print         Bethel Born, PA-C  08/01/16 0754    Tilden Fossa, MD 08/02/16 1520

## 2016-07-29 LAB — TYPE AND SCREEN
Blood Product Expiration Date: 201712212359
Blood Product Expiration Date: 201712272359
UNIT TYPE AND RH: 5100
Unit Type and Rh: 5100

## 2016-07-31 DIAGNOSIS — M25562 Pain in left knee: Secondary | ICD-10-CM | POA: Diagnosis not present

## 2016-07-31 DIAGNOSIS — N390 Urinary tract infection, site not specified: Secondary | ICD-10-CM | POA: Diagnosis not present

## 2016-08-03 DIAGNOSIS — N390 Urinary tract infection, site not specified: Secondary | ICD-10-CM | POA: Diagnosis not present

## 2016-08-03 DIAGNOSIS — M25562 Pain in left knee: Secondary | ICD-10-CM | POA: Diagnosis not present

## 2016-08-04 DIAGNOSIS — K5900 Constipation, unspecified: Secondary | ICD-10-CM | POA: Diagnosis not present

## 2016-08-04 DIAGNOSIS — E785 Hyperlipidemia, unspecified: Secondary | ICD-10-CM | POA: Diagnosis not present

## 2016-08-06 DIAGNOSIS — M25562 Pain in left knee: Secondary | ICD-10-CM | POA: Diagnosis not present

## 2016-08-06 DIAGNOSIS — N390 Urinary tract infection, site not specified: Secondary | ICD-10-CM | POA: Diagnosis not present

## 2016-08-12 DIAGNOSIS — N390 Urinary tract infection, site not specified: Secondary | ICD-10-CM | POA: Diagnosis not present

## 2016-08-12 DIAGNOSIS — M25562 Pain in left knee: Secondary | ICD-10-CM | POA: Diagnosis not present

## 2016-08-21 DIAGNOSIS — M25562 Pain in left knee: Secondary | ICD-10-CM | POA: Diagnosis not present

## 2016-08-21 DIAGNOSIS — N390 Urinary tract infection, site not specified: Secondary | ICD-10-CM | POA: Diagnosis not present

## 2016-08-24 DIAGNOSIS — N390 Urinary tract infection, site not specified: Secondary | ICD-10-CM | POA: Diagnosis not present

## 2016-08-24 DIAGNOSIS — M25562 Pain in left knee: Secondary | ICD-10-CM | POA: Diagnosis not present

## 2016-08-27 DIAGNOSIS — M25562 Pain in left knee: Secondary | ICD-10-CM | POA: Diagnosis not present

## 2016-08-27 DIAGNOSIS — N390 Urinary tract infection, site not specified: Secondary | ICD-10-CM | POA: Diagnosis not present

## 2016-08-31 DIAGNOSIS — M25562 Pain in left knee: Secondary | ICD-10-CM | POA: Diagnosis not present

## 2016-08-31 DIAGNOSIS — N390 Urinary tract infection, site not specified: Secondary | ICD-10-CM | POA: Diagnosis not present

## 2016-09-04 DIAGNOSIS — M25562 Pain in left knee: Secondary | ICD-10-CM | POA: Diagnosis not present

## 2016-09-04 DIAGNOSIS — N390 Urinary tract infection, site not specified: Secondary | ICD-10-CM | POA: Diagnosis not present

## 2016-09-10 DIAGNOSIS — N390 Urinary tract infection, site not specified: Secondary | ICD-10-CM | POA: Diagnosis not present

## 2016-09-10 DIAGNOSIS — M25562 Pain in left knee: Secondary | ICD-10-CM | POA: Diagnosis not present

## 2016-09-16 DIAGNOSIS — N390 Urinary tract infection, site not specified: Secondary | ICD-10-CM | POA: Diagnosis not present

## 2016-09-16 DIAGNOSIS — M25562 Pain in left knee: Secondary | ICD-10-CM | POA: Diagnosis not present

## 2016-09-17 DIAGNOSIS — M25562 Pain in left knee: Secondary | ICD-10-CM | POA: Diagnosis not present

## 2016-09-17 DIAGNOSIS — N390 Urinary tract infection, site not specified: Secondary | ICD-10-CM | POA: Diagnosis not present

## 2016-09-18 DIAGNOSIS — N390 Urinary tract infection, site not specified: Secondary | ICD-10-CM | POA: Diagnosis not present

## 2016-09-18 DIAGNOSIS — M25562 Pain in left knee: Secondary | ICD-10-CM | POA: Diagnosis not present

## 2016-09-21 DIAGNOSIS — N390 Urinary tract infection, site not specified: Secondary | ICD-10-CM | POA: Diagnosis not present

## 2016-09-21 DIAGNOSIS — M25562 Pain in left knee: Secondary | ICD-10-CM | POA: Diagnosis not present

## 2016-09-23 DIAGNOSIS — M25562 Pain in left knee: Secondary | ICD-10-CM | POA: Diagnosis not present

## 2016-09-23 DIAGNOSIS — M170 Bilateral primary osteoarthritis of knee: Secondary | ICD-10-CM | POA: Diagnosis not present

## 2016-09-23 DIAGNOSIS — M25561 Pain in right knee: Secondary | ICD-10-CM | POA: Diagnosis not present

## 2016-09-23 DIAGNOSIS — N390 Urinary tract infection, site not specified: Secondary | ICD-10-CM | POA: Diagnosis not present

## 2016-09-24 DIAGNOSIS — M25562 Pain in left knee: Secondary | ICD-10-CM | POA: Diagnosis not present

## 2016-09-24 DIAGNOSIS — G35 Multiple sclerosis: Secondary | ICD-10-CM | POA: Diagnosis not present

## 2016-09-29 DIAGNOSIS — G35 Multiple sclerosis: Secondary | ICD-10-CM | POA: Diagnosis not present

## 2016-09-29 DIAGNOSIS — M25562 Pain in left knee: Secondary | ICD-10-CM | POA: Diagnosis not present

## 2016-09-30 DIAGNOSIS — M25562 Pain in left knee: Secondary | ICD-10-CM | POA: Diagnosis not present

## 2016-09-30 DIAGNOSIS — G35 Multiple sclerosis: Secondary | ICD-10-CM | POA: Diagnosis not present

## 2016-10-01 DIAGNOSIS — G35 Multiple sclerosis: Secondary | ICD-10-CM | POA: Diagnosis not present

## 2016-10-01 DIAGNOSIS — M25562 Pain in left knee: Secondary | ICD-10-CM | POA: Diagnosis not present

## 2016-10-02 DIAGNOSIS — G35 Multiple sclerosis: Secondary | ICD-10-CM | POA: Diagnosis not present

## 2016-10-02 DIAGNOSIS — M25562 Pain in left knee: Secondary | ICD-10-CM | POA: Diagnosis not present

## 2016-10-06 DIAGNOSIS — M25562 Pain in left knee: Secondary | ICD-10-CM | POA: Diagnosis not present

## 2016-10-06 DIAGNOSIS — G35 Multiple sclerosis: Secondary | ICD-10-CM | POA: Diagnosis not present

## 2016-10-07 DIAGNOSIS — G35 Multiple sclerosis: Secondary | ICD-10-CM | POA: Diagnosis not present

## 2016-10-07 DIAGNOSIS — M25562 Pain in left knee: Secondary | ICD-10-CM | POA: Diagnosis not present

## 2016-10-08 DIAGNOSIS — M25562 Pain in left knee: Secondary | ICD-10-CM | POA: Diagnosis not present

## 2016-10-08 DIAGNOSIS — G35 Multiple sclerosis: Secondary | ICD-10-CM | POA: Diagnosis not present

## 2016-10-09 DIAGNOSIS — G35 Multiple sclerosis: Secondary | ICD-10-CM | POA: Diagnosis not present

## 2016-10-09 DIAGNOSIS — M25562 Pain in left knee: Secondary | ICD-10-CM | POA: Diagnosis not present

## 2016-10-13 DIAGNOSIS — M25562 Pain in left knee: Secondary | ICD-10-CM | POA: Diagnosis not present

## 2016-10-13 DIAGNOSIS — M1711 Unilateral primary osteoarthritis, right knee: Secondary | ICD-10-CM | POA: Diagnosis not present

## 2016-10-13 DIAGNOSIS — M4727 Other spondylosis with radiculopathy, lumbosacral region: Secondary | ICD-10-CM | POA: Diagnosis not present

## 2016-10-13 DIAGNOSIS — G35 Multiple sclerosis: Secondary | ICD-10-CM | POA: Diagnosis not present

## 2016-10-14 DIAGNOSIS — M25562 Pain in left knee: Secondary | ICD-10-CM | POA: Diagnosis not present

## 2016-10-14 DIAGNOSIS — G35 Multiple sclerosis: Secondary | ICD-10-CM | POA: Diagnosis not present

## 2016-10-15 DIAGNOSIS — M25562 Pain in left knee: Secondary | ICD-10-CM | POA: Diagnosis not present

## 2016-10-15 DIAGNOSIS — G35 Multiple sclerosis: Secondary | ICD-10-CM | POA: Diagnosis not present

## 2016-10-16 DIAGNOSIS — R2681 Unsteadiness on feet: Secondary | ICD-10-CM | POA: Diagnosis not present

## 2016-10-16 DIAGNOSIS — G35 Multiple sclerosis: Secondary | ICD-10-CM | POA: Diagnosis not present

## 2016-10-16 DIAGNOSIS — N390 Urinary tract infection, site not specified: Secondary | ICD-10-CM | POA: Diagnosis not present

## 2016-10-16 DIAGNOSIS — M25562 Pain in left knee: Secondary | ICD-10-CM | POA: Diagnosis not present

## 2016-10-16 IMAGING — CR DG ABDOMEN ACUTE W/ 1V CHEST
3 series · 3 of 3 positions shown · non-contrast
Comparison: 08/30/2009

CLINICAL DATA: Vomiting, weakness. Nausea and diarrhea. Recent
scratch head recent treatment for C difficile.

EXAM:
ACUTE ABDOMEN SERIES (ABDOMEN 2 VIEW & CHEST 1 VIEW)

[w chest pa]
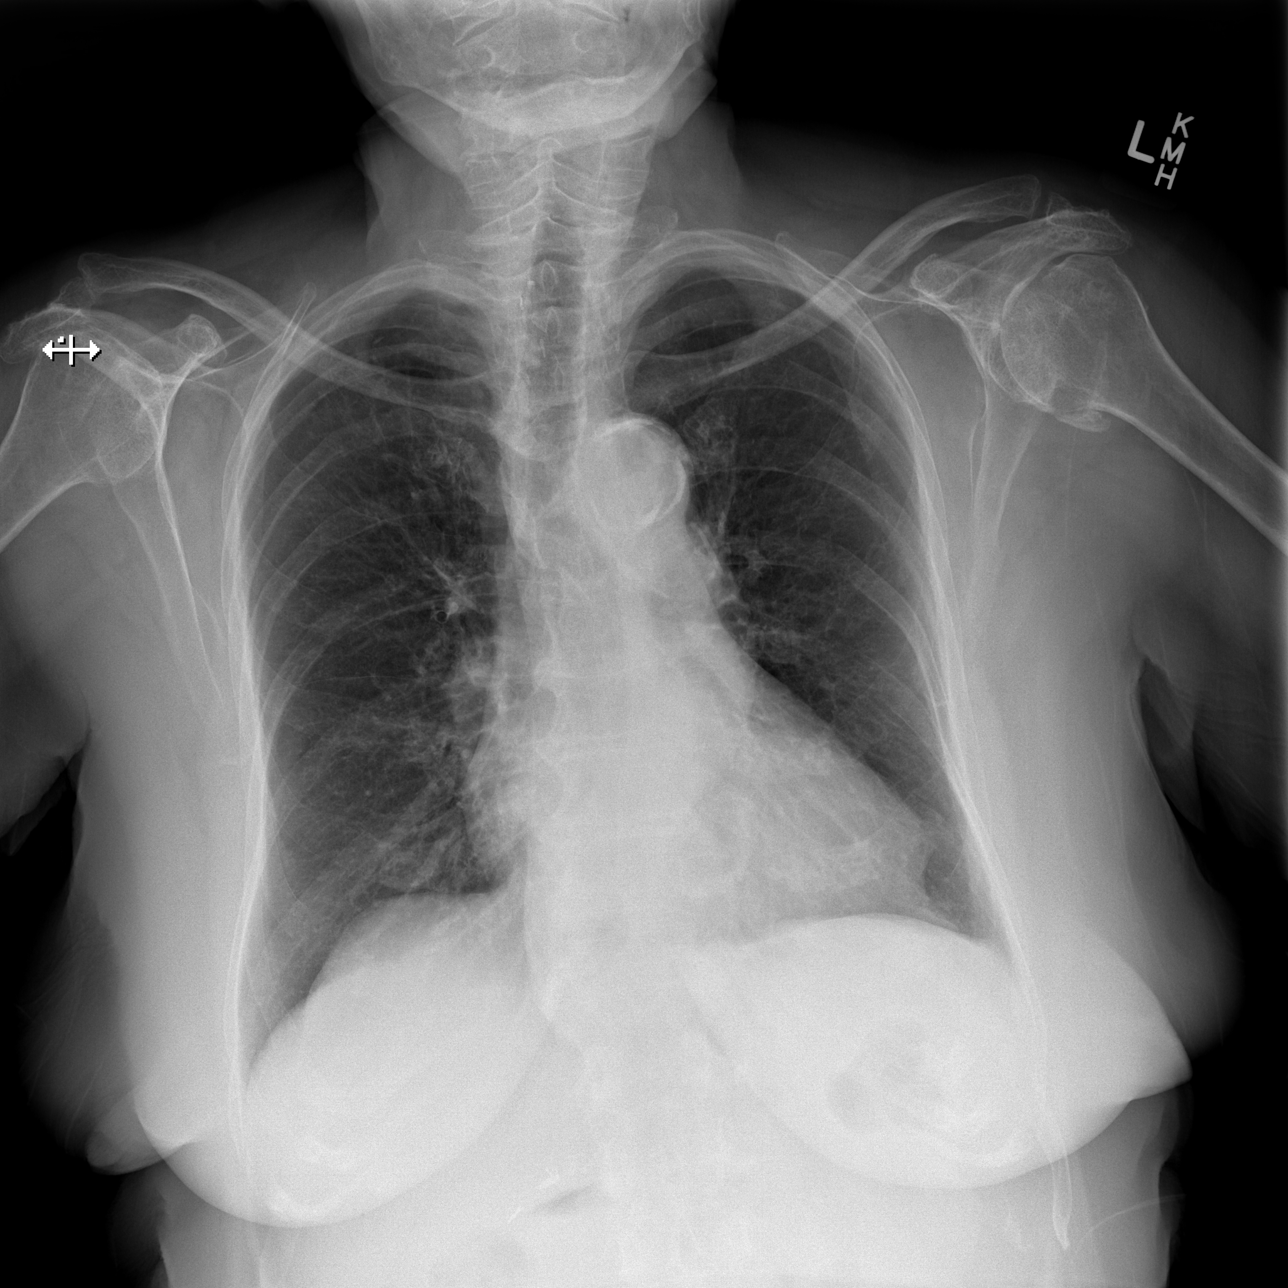

[w abdomen upright]
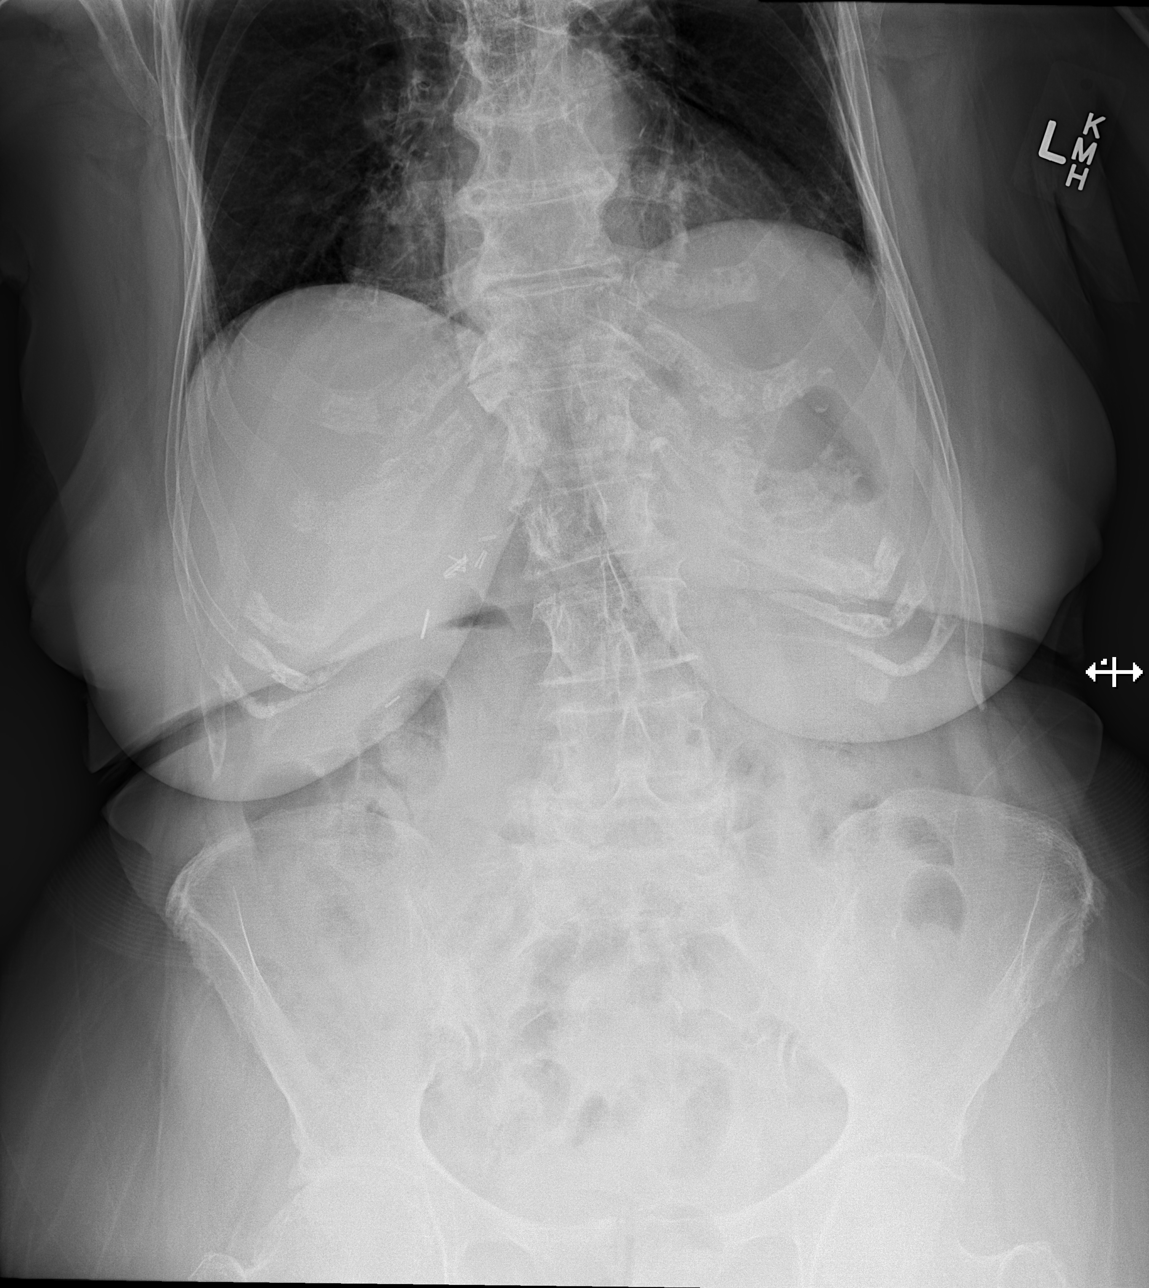

[t abdomen supine]
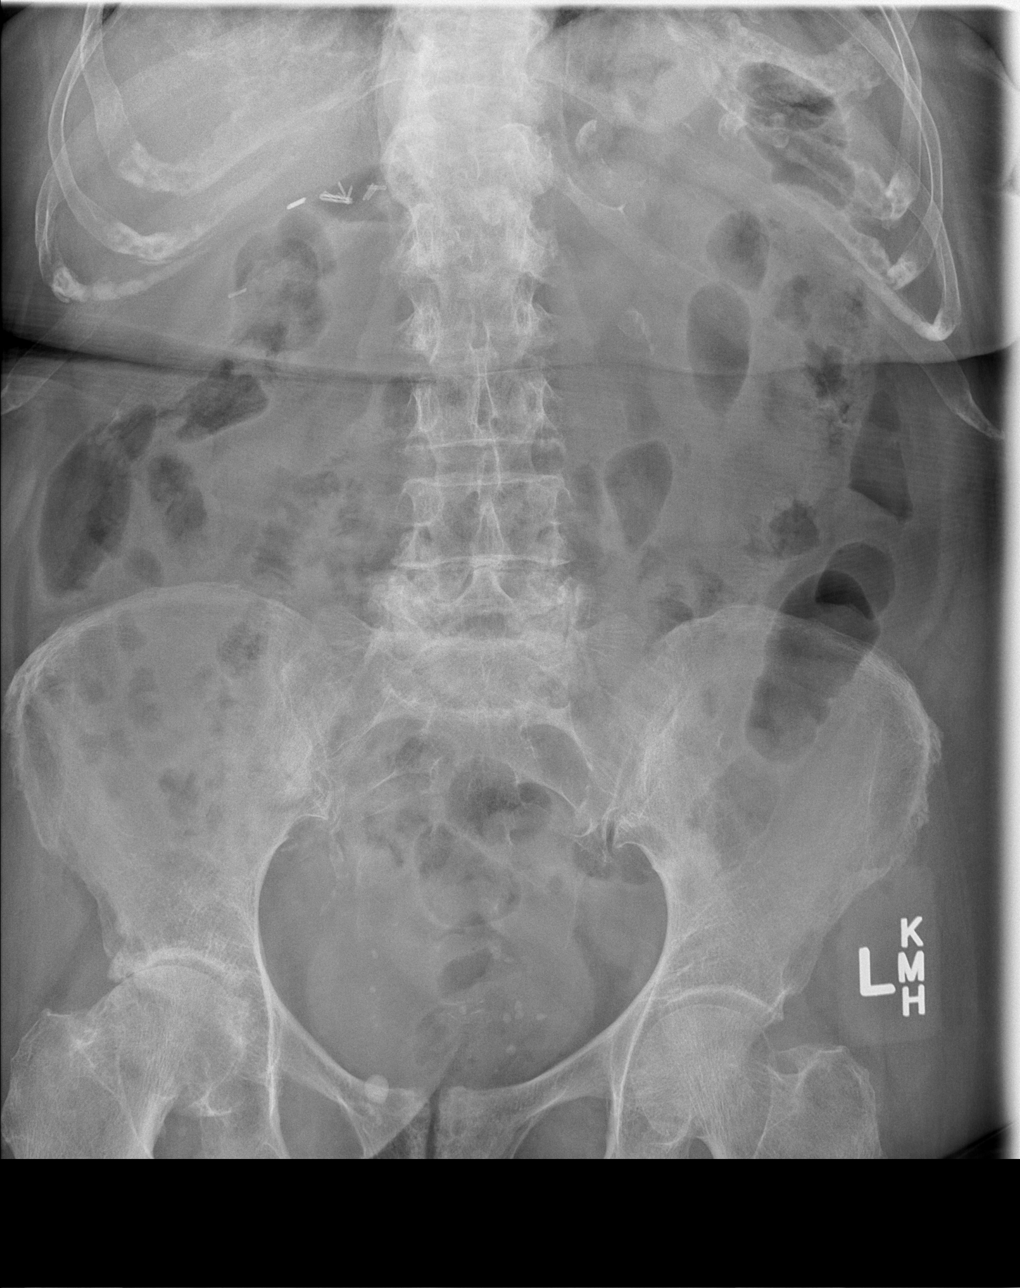

[3 of 3 positions shown; findings below may reference images not displayed]

FINDINGS: Heart is borderline in size.  Lungs are clear.  No effusions.

Nonobstructive bowel gas pattern. No free air. Prior
cholecystectomy. No organomegaly or suspicious calcification.
Vascular calcifications noted. No acute bony abnormality.
Degenerative changes in the thoracolumbar spine and hips, right
greater than left.
IMPRESSION: No evidence of bowel obstruction or free air.

No acute cardiopulmonary disease.

## 2016-10-18 IMAGING — CT CT HEAD W/O CM
1 of 2 series · 15 of 30 positions shown, 19 images · non-contrast
Comparison: CT of the head performed 06/12/2010, and MRI of the
brain performed 01/17/2010

CLINICAL DATA: Acute onset of right-sided weakness and inability to
move the right side of the mouth. Initial encounter.

EXAM:
CT HEAD WITHOUT CONTRAST
TECHNIQUE: Contiguous axial images were obtained from the base of the skull
through the vertex without intravenous contrast.

[Series 2: headseq 4.8 h45s · axial · 0.43mm/px · z∈[-127,+28]mm · 15 of 36 slices shown, 19 images]
[im 2/36  brain]
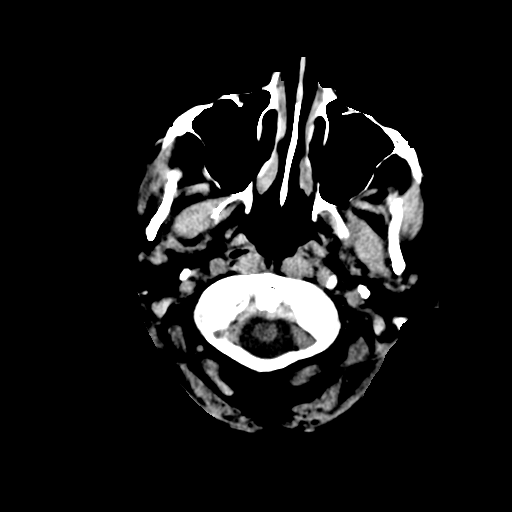
[im 2/36  bone]
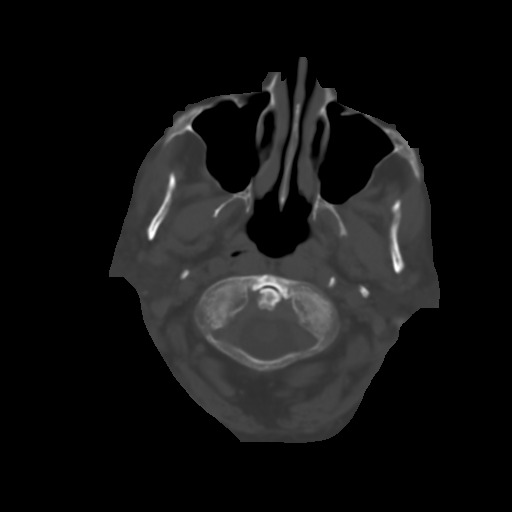
[im 6/36  brain]
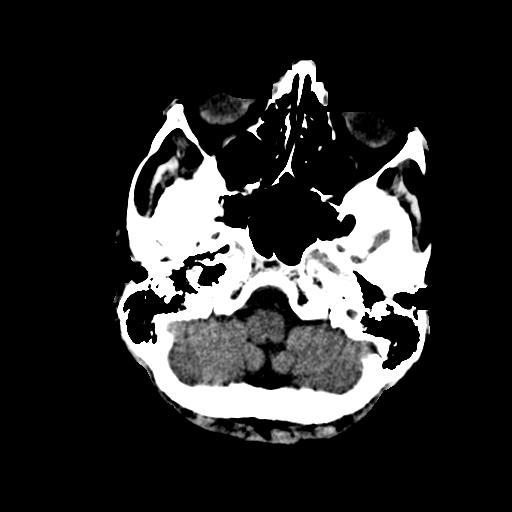
[im 7/36  brain]
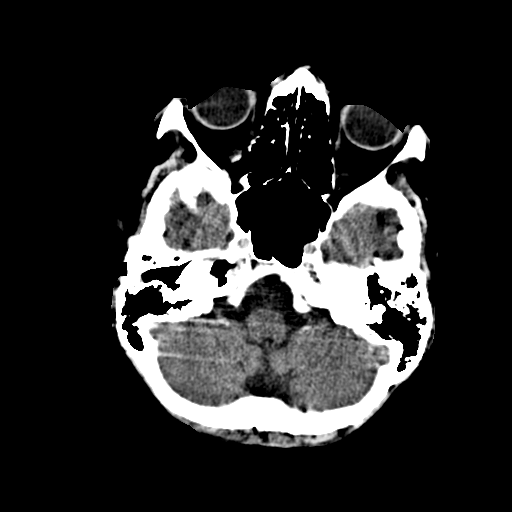
[im 9/36  brain]
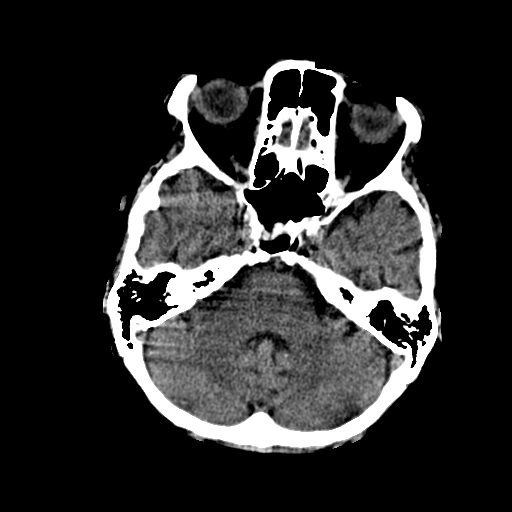
[im 12/36  brain]
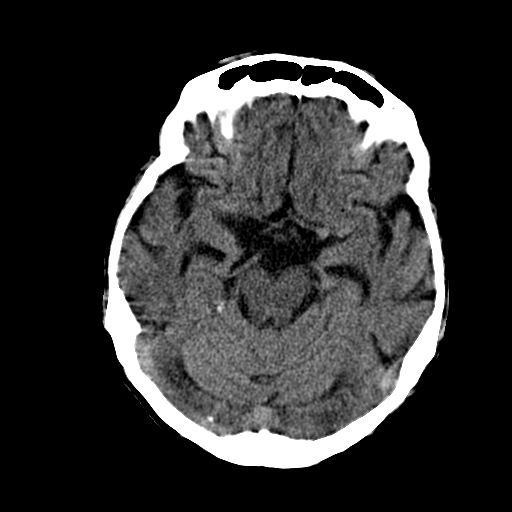
[im 12/36  bone]
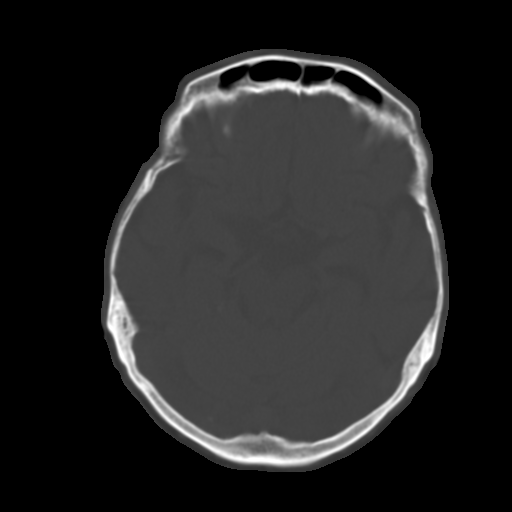
[im 14/36  brain]
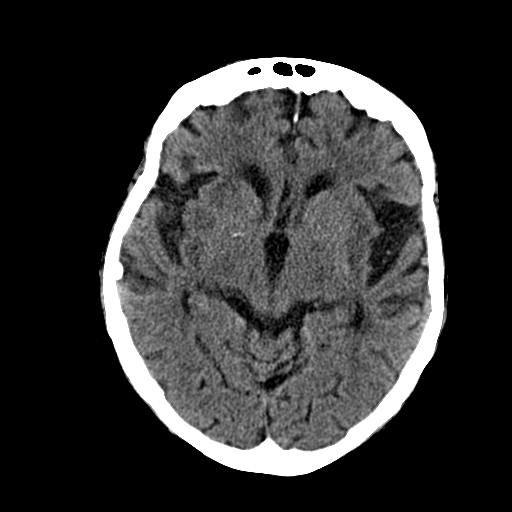
[im 16/36  brain]
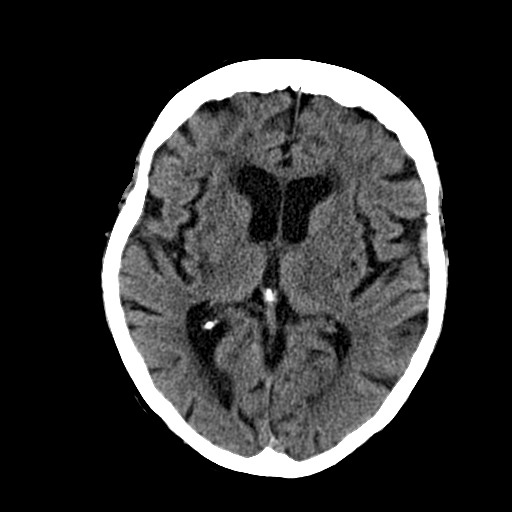
[im 19/36  brain]
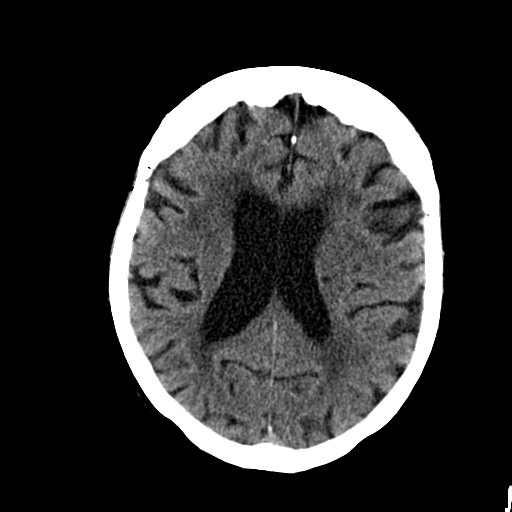
[im 21/36  brain]
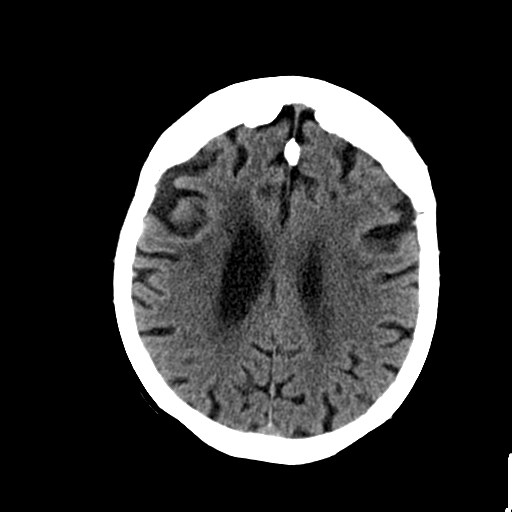
[im 21/36  bone]
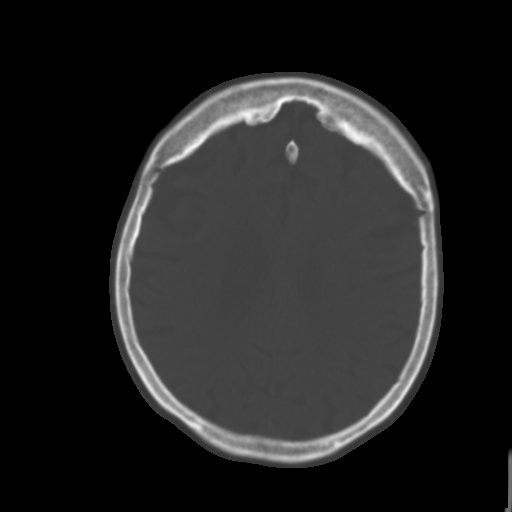
[im 22/36  brain]
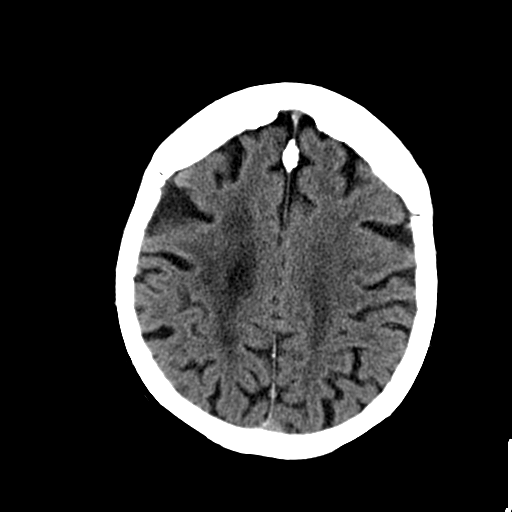
[im 26/36  brain]
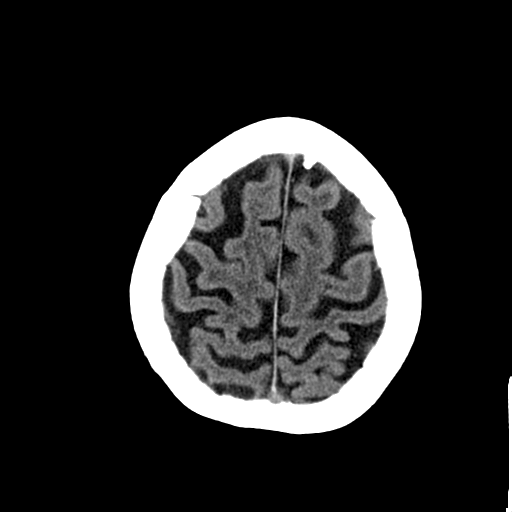
[im 27/36  brain]
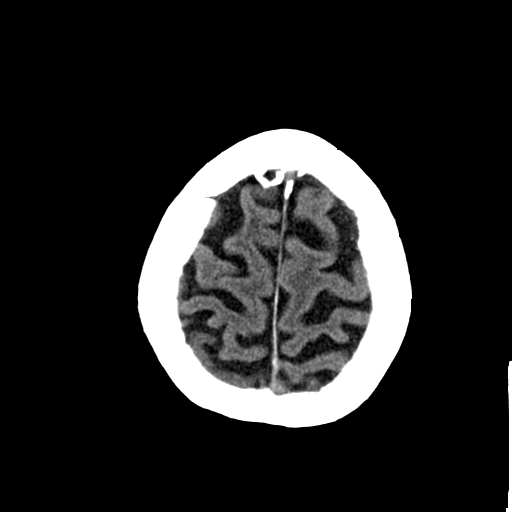
[im 29/36  brain]
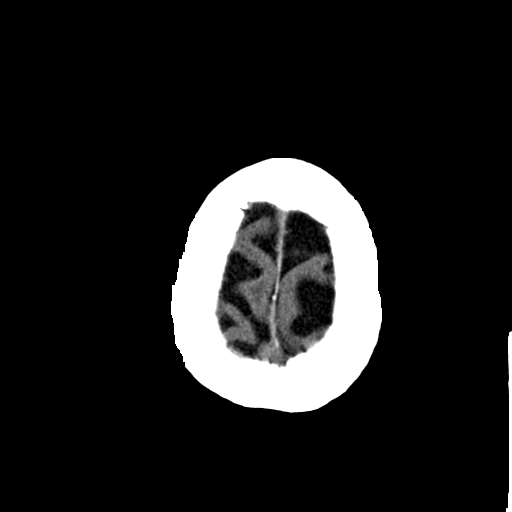
[im 29/36  bone]
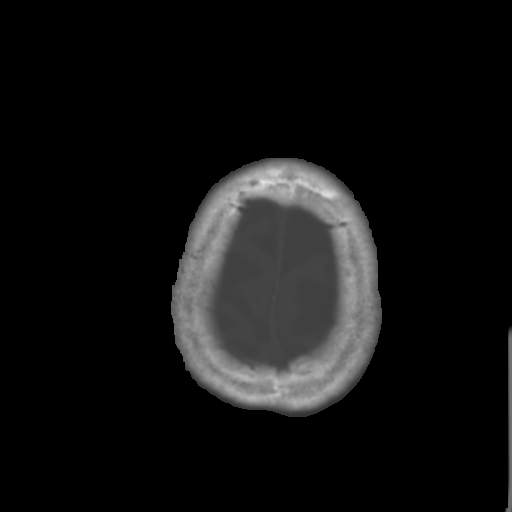
[im 32/36  brain]
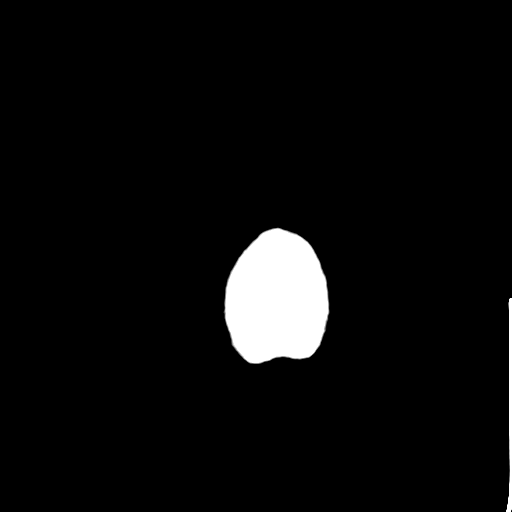
[im 34/36  brain]
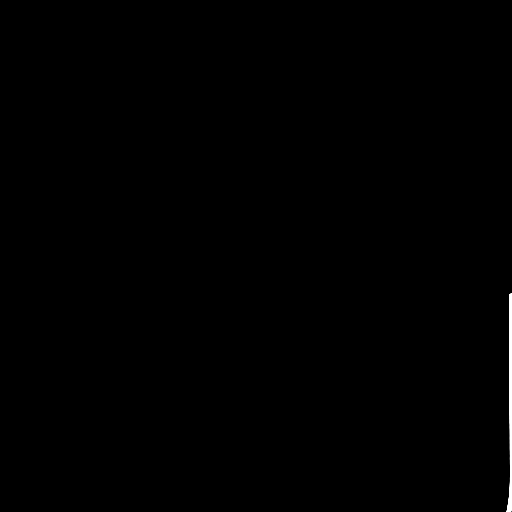

[15 of 30 positions shown; findings below may reference images not displayed]

FINDINGS: There is no evidence of acute infarction, mass lesion, or intra- or
extra-axial hemorrhage on CT.

Prominence of the ventricles and sulci reflects moderate cortical
volume loss. Scattered periventricular and subcortical white matter
change likely reflects small vessel ischemic microangiopathy.
Cerebellar atrophy is noted. Scattered chronic lacunar infarcts are
seen at the basal ganglia bilaterally.

The brainstem and fourth ventricle are within normal limits. The
cerebral hemispheres demonstrate grossly normal gray-white
differentiation. No mass effect or midline shift is seen.

There is no evidence of fracture; visualized osseous structures are
unremarkable in appearance. The orbits are within normal limits. The
paranasal sinuses and mastoid air cells are well-aerated. No
significant soft tissue abnormalities are seen.
IMPRESSION: 1. No acute intracranial pathology seen on CT.
2. Moderate cortical volume loss and scattered small vessel ischemic
microangiopathy.
3. Scattered chronic lacunar infarcts at the basal ganglia
bilaterally.

These results were called by telephone at the time of interpretation
on 08/31/2014 at [DATE] to Dr. RAM BHAJAN PAKE, who verbally
acknowledged these results.

## 2016-10-18 IMAGING — MR MR HEAD W/O CM
8 of 11 series · 35 of 48 positions shown · non-contrast
Comparison: CT head earlier today.  MR head 08/30/2009.

CLINICAL DATA: Brief moment of forgetfulness occurring earlier
today. Symptoms have now resolved. History of permanent atrial
fibrillation with anticoagulation. History of prior CVA. History of
multiple sclerosis.

EXAM:
MRI HEAD WITHOUT CONTRAST
TECHNIQUE: Multiplanar, multiecho pulse sequences of the brain and surrounding
structures were obtained without intravenous contrast.

[Series 4: DWI · axial · 3.0mm · 1.09mm/px · z∈[-51,+98]mm · 8 of 102 slices shown (1 of 4)]
[im 1/102]
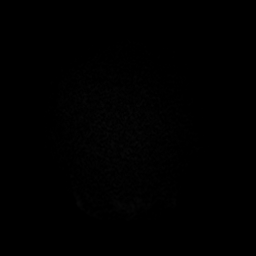
[im 12/102]
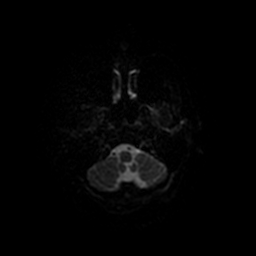
[im 34/102]
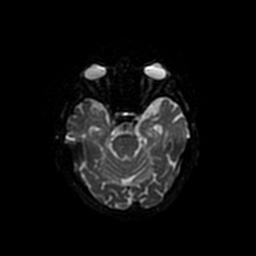
[im 45/102]
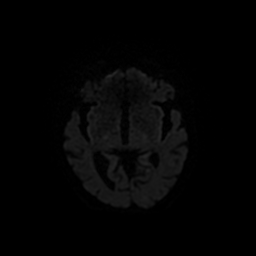
[im 57/102]
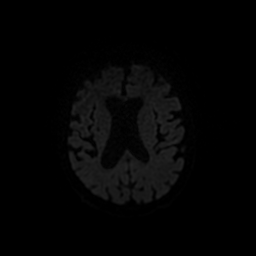
[im 68/102]
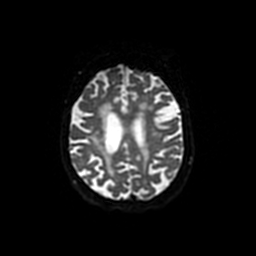
[im 90/102]
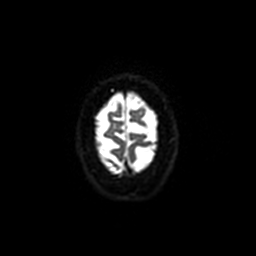
[im 102/102]
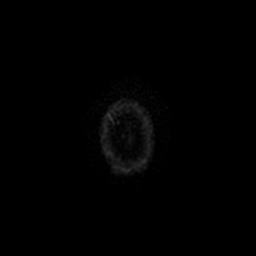

[Series 5: DWI · coronal · 5.0mm · 1.09mm/px · 7 of 68 slices shown (2 of 4)]
[im 1/68]
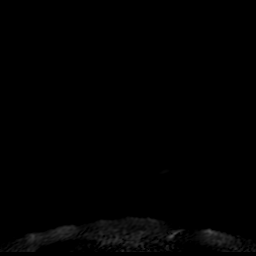
[im 12/68]
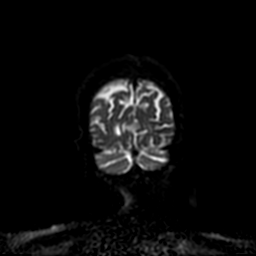
[im 23/68]
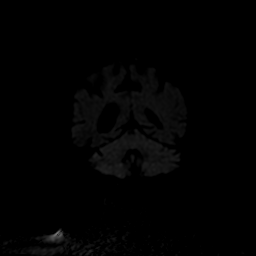
[im 34/68]
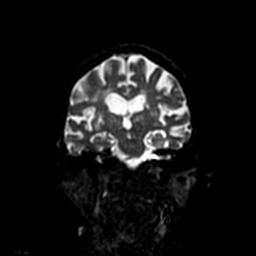
[im 45/68]
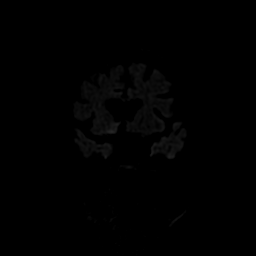
[im 56/68]
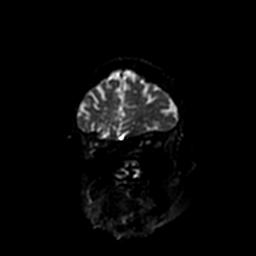
[im 68/68]
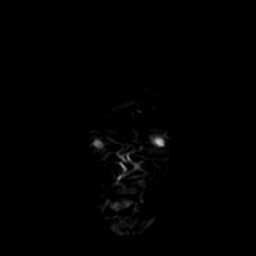

[Series 6: T2 · axial · 5.0mm · 0.43mm/px · z∈[-48,+105]mm · 3 of 25 slices shown (1 of 3)]
[im 1/25]
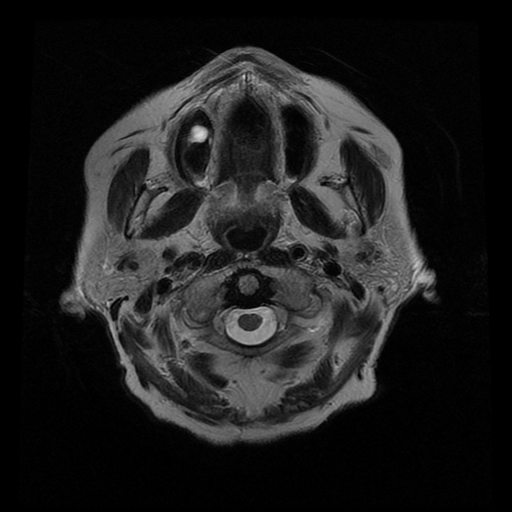
[im 13/25]
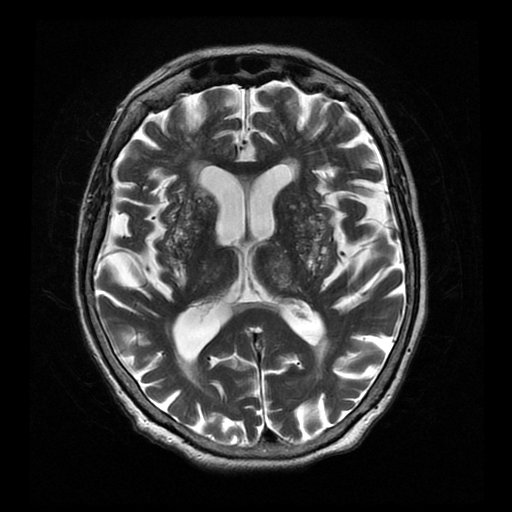
[im 25/25]
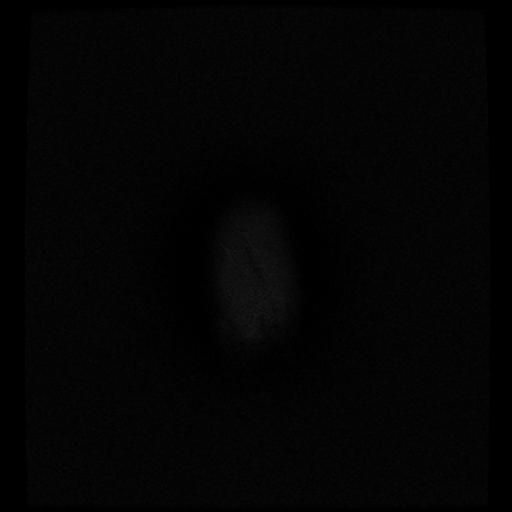

[Series 7: FLAIR · axial · 5.0mm · 0.43mm/px · z∈[-55,+111]mm · 3 of 29 slices shown]
[im 1/29]
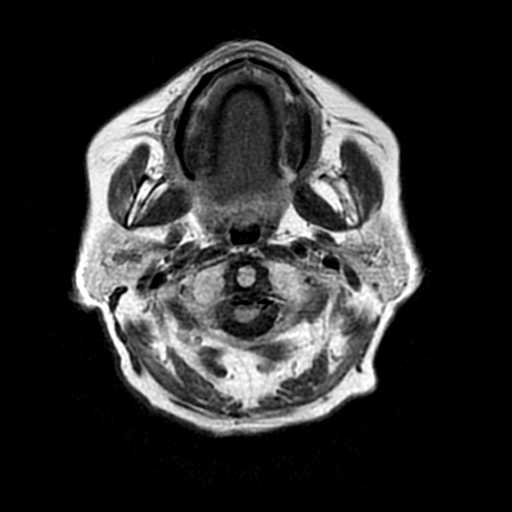
[im 15/29]
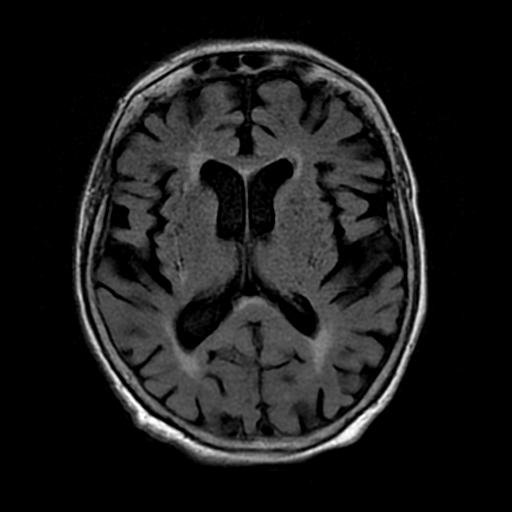
[im 29/29]
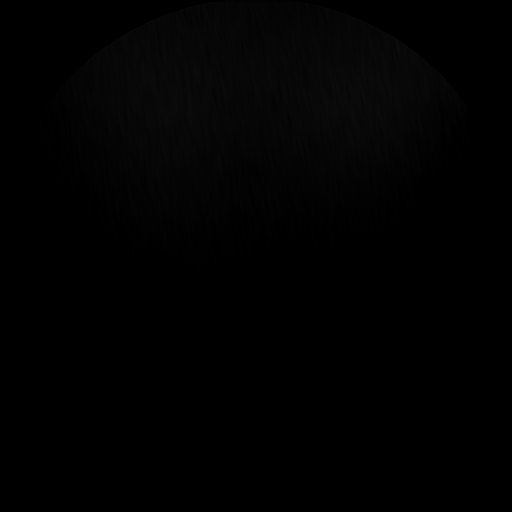

[Series 8: T2 · axial · 5.0mm · 0.43mm/px · z∈[-57,+114]mm · 3 of 30 slices shown (2 of 3)]
[im 1/30]
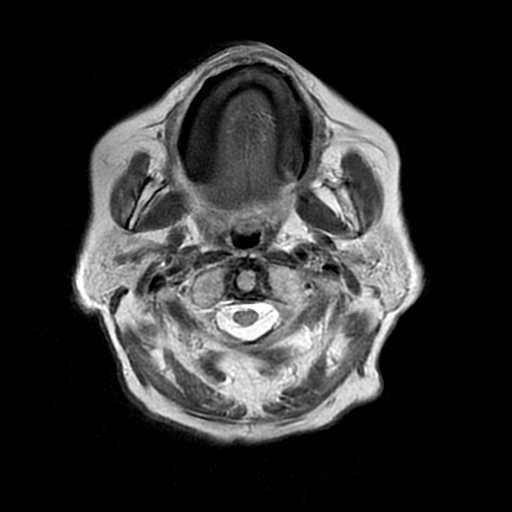
[im 15/30]
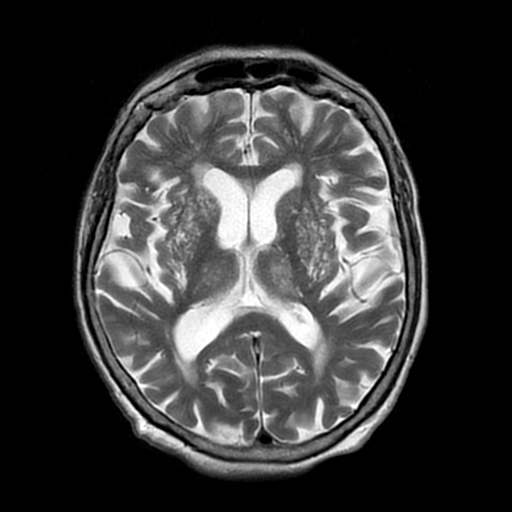
[im 30/30]
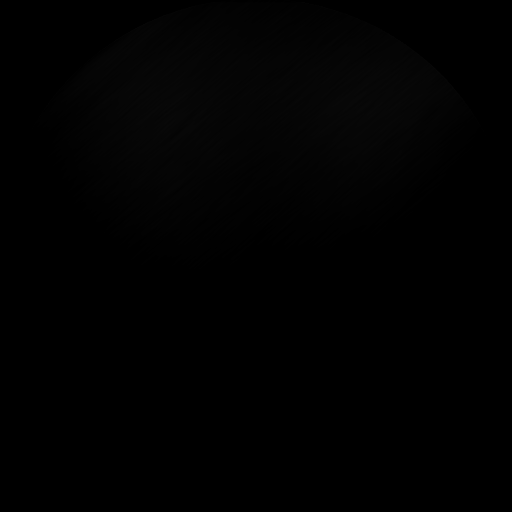

[Series 11: T2 · coronal · 5.0mm · 0.45mm/px · 3 of 26 slices shown (3 of 3)]
[im 1/26]
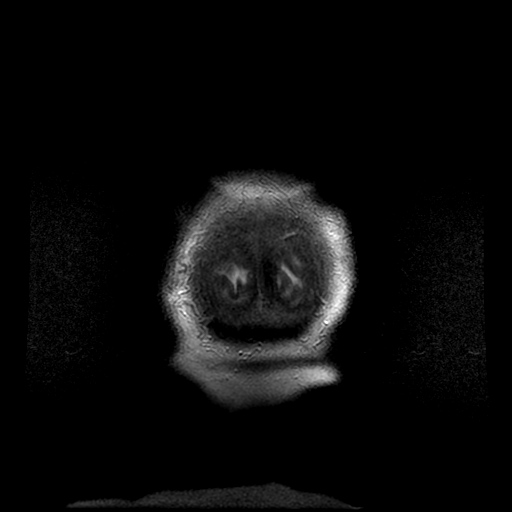
[im 13/26]
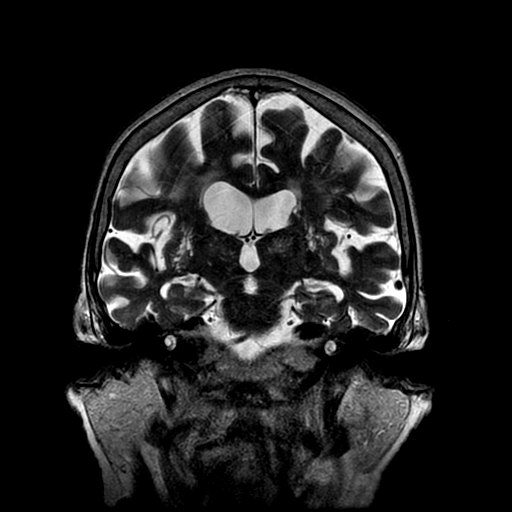
[im 26/26]
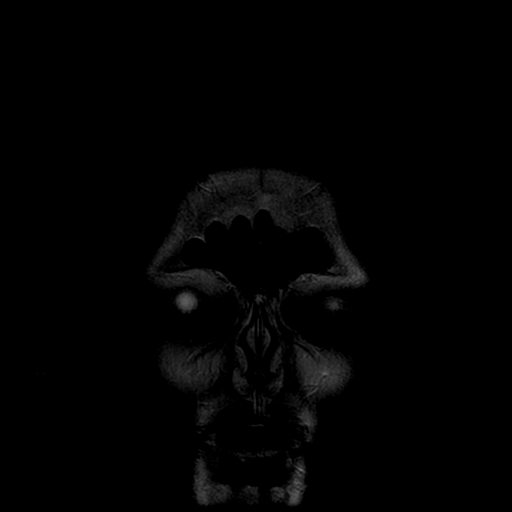

[Series 400: DWI · axial · 3.0mm · 1.09mm/px · z∈[-51,+98]mm · 5 of 51 slices shown (3 of 4)]
[im 1/51]
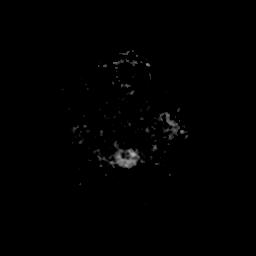
[im 13/51]
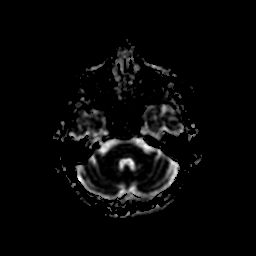
[im 26/51]
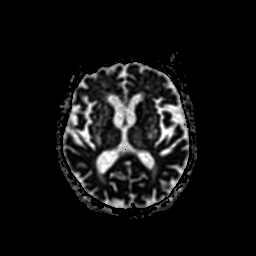
[im 38/51]
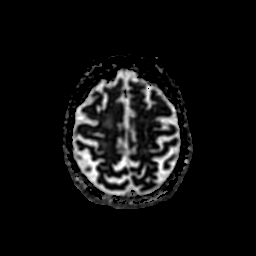
[im 51/51]
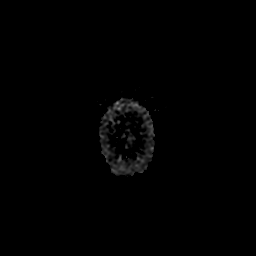

[Series 500: DWI · coronal · 5.0mm · 1.09mm/px · 3 of 34 slices shown (4 of 4)]
[im 1/34]
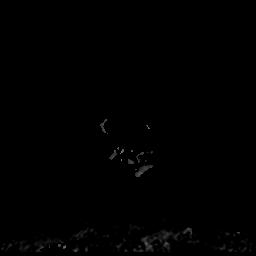
[im 17/34]
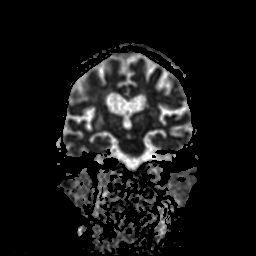
[im 34/34]
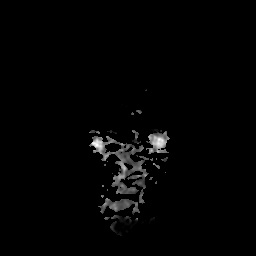

[35 of 48 positions shown; findings below may reference images not displayed]

FINDINGS: No evidence for acute infarction, hemorrhage, mass lesion,
hydrocephalus, or extra-axial fluid. Advanced cerebral and
cerebellar atrophy. Moderately advanced T2 and FLAIR
hyperintensities in the periventricular and subcortical white
matter, which likely represent a combination of chronic
microvascular ischemic change and multiple sclerosis. The patient
has a history of multiple sclerosis with no reported exacerbations
recently. There is only mild white matter signal abnormality in the
cerebellum and pons. Scattered lacunar infarcts. Scattered prominent
perivascular spaces.

Pituitary, pineal, and cerebellar tonsils unremarkable. No upper
cervical lesions. Flow voids are maintained throughout the carotid,
basilar, and vertebral arteries. There are no areas of chronic
hemorrhage. Fetal origin of both posterior cerebral is results in
basilar hypoplasia. Visualized calvarium, skull base, and upper
cervical osseous structures unremarkable. Scalp and extracranial
soft tissues, orbits, sinuses, and mastoids show no acute process.

Compared with the recent CT, there is no change. Compared with prior
MR, the infarct has resolved.
IMPRESSION: Advanced atrophy and white matter disease.  See discussion above.

Resolved RIGHT posterior frontal acute infarct from 0577.

No acute intracranial abnormality.

## 2016-10-22 DIAGNOSIS — M25562 Pain in left knee: Secondary | ICD-10-CM | POA: Diagnosis not present

## 2016-10-22 DIAGNOSIS — G35 Multiple sclerosis: Secondary | ICD-10-CM | POA: Diagnosis not present

## 2016-10-26 DIAGNOSIS — M25562 Pain in left knee: Secondary | ICD-10-CM | POA: Diagnosis not present

## 2016-10-26 DIAGNOSIS — G35 Multiple sclerosis: Secondary | ICD-10-CM | POA: Diagnosis not present

## 2016-10-27 DIAGNOSIS — G35 Multiple sclerosis: Secondary | ICD-10-CM | POA: Diagnosis not present

## 2016-10-27 DIAGNOSIS — N390 Urinary tract infection, site not specified: Secondary | ICD-10-CM | POA: Diagnosis not present

## 2016-10-27 DIAGNOSIS — M25562 Pain in left knee: Secondary | ICD-10-CM | POA: Diagnosis not present

## 2016-10-27 DIAGNOSIS — N3090 Cystitis, unspecified without hematuria: Secondary | ICD-10-CM | POA: Diagnosis not present

## 2016-11-02 DIAGNOSIS — G35 Multiple sclerosis: Secondary | ICD-10-CM | POA: Diagnosis not present

## 2016-11-02 DIAGNOSIS — M25562 Pain in left knee: Secondary | ICD-10-CM | POA: Diagnosis not present

## 2016-11-04 DIAGNOSIS — G35 Multiple sclerosis: Secondary | ICD-10-CM | POA: Diagnosis not present

## 2016-11-04 DIAGNOSIS — M25562 Pain in left knee: Secondary | ICD-10-CM | POA: Diagnosis not present

## 2016-11-09 DIAGNOSIS — M25562 Pain in left knee: Secondary | ICD-10-CM | POA: Diagnosis not present

## 2016-11-09 DIAGNOSIS — G35 Multiple sclerosis: Secondary | ICD-10-CM | POA: Diagnosis not present

## 2016-11-10 DIAGNOSIS — G35 Multiple sclerosis: Secondary | ICD-10-CM | POA: Diagnosis not present

## 2016-11-10 DIAGNOSIS — M25562 Pain in left knee: Secondary | ICD-10-CM | POA: Diagnosis not present

## 2016-11-12 DIAGNOSIS — G35 Multiple sclerosis: Secondary | ICD-10-CM | POA: Diagnosis not present

## 2016-11-12 DIAGNOSIS — M25562 Pain in left knee: Secondary | ICD-10-CM | POA: Diagnosis not present

## 2016-11-12 DIAGNOSIS — B373 Candidiasis of vulva and vagina: Secondary | ICD-10-CM | POA: Diagnosis not present

## 2016-11-17 DIAGNOSIS — G35 Multiple sclerosis: Secondary | ICD-10-CM | POA: Diagnosis not present

## 2016-11-17 DIAGNOSIS — M25562 Pain in left knee: Secondary | ICD-10-CM | POA: Diagnosis not present

## 2016-11-17 DIAGNOSIS — N390 Urinary tract infection, site not specified: Secondary | ICD-10-CM | POA: Diagnosis not present

## 2016-11-18 DIAGNOSIS — M25562 Pain in left knee: Secondary | ICD-10-CM | POA: Diagnosis not present

## 2016-11-18 DIAGNOSIS — G35 Multiple sclerosis: Secondary | ICD-10-CM | POA: Diagnosis not present

## 2016-11-19 DIAGNOSIS — M25562 Pain in left knee: Secondary | ICD-10-CM | POA: Diagnosis not present

## 2016-11-19 DIAGNOSIS — G35 Multiple sclerosis: Secondary | ICD-10-CM | POA: Diagnosis not present

## 2016-11-20 DIAGNOSIS — G35 Multiple sclerosis: Secondary | ICD-10-CM | POA: Diagnosis not present

## 2016-11-20 DIAGNOSIS — M25562 Pain in left knee: Secondary | ICD-10-CM | POA: Diagnosis not present

## 2016-11-21 DIAGNOSIS — G35 Multiple sclerosis: Secondary | ICD-10-CM | POA: Diagnosis not present

## 2016-11-21 DIAGNOSIS — M25562 Pain in left knee: Secondary | ICD-10-CM | POA: Diagnosis not present

## 2016-11-23 DIAGNOSIS — G35 Multiple sclerosis: Secondary | ICD-10-CM | POA: Diagnosis not present

## 2016-11-23 DIAGNOSIS — N390 Urinary tract infection, site not specified: Secondary | ICD-10-CM | POA: Diagnosis not present

## 2016-11-24 DIAGNOSIS — G35 Multiple sclerosis: Secondary | ICD-10-CM | POA: Diagnosis not present

## 2016-11-24 DIAGNOSIS — N390 Urinary tract infection, site not specified: Secondary | ICD-10-CM | POA: Diagnosis not present

## 2016-11-27 DIAGNOSIS — G35 Multiple sclerosis: Secondary | ICD-10-CM | POA: Diagnosis not present

## 2016-11-27 DIAGNOSIS — N390 Urinary tract infection, site not specified: Secondary | ICD-10-CM | POA: Diagnosis not present

## 2016-11-30 DIAGNOSIS — G35 Multiple sclerosis: Secondary | ICD-10-CM | POA: Diagnosis not present

## 2016-11-30 DIAGNOSIS — N390 Urinary tract infection, site not specified: Secondary | ICD-10-CM | POA: Diagnosis not present

## 2016-12-03 DIAGNOSIS — N390 Urinary tract infection, site not specified: Secondary | ICD-10-CM | POA: Diagnosis not present

## 2016-12-03 DIAGNOSIS — G35 Multiple sclerosis: Secondary | ICD-10-CM | POA: Diagnosis not present

## 2016-12-03 DIAGNOSIS — K921 Melena: Secondary | ICD-10-CM | POA: Diagnosis not present

## 2016-12-07 DIAGNOSIS — N390 Urinary tract infection, site not specified: Secondary | ICD-10-CM | POA: Diagnosis not present

## 2016-12-07 DIAGNOSIS — G35 Multiple sclerosis: Secondary | ICD-10-CM | POA: Diagnosis not present

## 2016-12-09 DIAGNOSIS — N390 Urinary tract infection, site not specified: Secondary | ICD-10-CM | POA: Diagnosis not present

## 2016-12-09 DIAGNOSIS — G35 Multiple sclerosis: Secondary | ICD-10-CM | POA: Diagnosis not present

## 2016-12-10 DIAGNOSIS — G35 Multiple sclerosis: Secondary | ICD-10-CM | POA: Diagnosis not present

## 2016-12-10 DIAGNOSIS — N390 Urinary tract infection, site not specified: Secondary | ICD-10-CM | POA: Diagnosis not present

## 2016-12-14 DIAGNOSIS — G35 Multiple sclerosis: Secondary | ICD-10-CM | POA: Diagnosis not present

## 2016-12-14 DIAGNOSIS — N390 Urinary tract infection, site not specified: Secondary | ICD-10-CM | POA: Diagnosis not present

## 2016-12-15 DIAGNOSIS — N390 Urinary tract infection, site not specified: Secondary | ICD-10-CM | POA: Diagnosis not present

## 2016-12-15 DIAGNOSIS — G35 Multiple sclerosis: Secondary | ICD-10-CM | POA: Diagnosis not present

## 2016-12-16 DIAGNOSIS — G35 Multiple sclerosis: Secondary | ICD-10-CM | POA: Diagnosis not present

## 2016-12-16 DIAGNOSIS — N390 Urinary tract infection, site not specified: Secondary | ICD-10-CM | POA: Diagnosis not present

## 2016-12-17 ENCOUNTER — Ambulatory Visit (INDEPENDENT_AMBULATORY_CARE_PROVIDER_SITE_OTHER): Payer: Medicare Other | Admitting: Cardiology

## 2016-12-17 ENCOUNTER — Encounter: Payer: Self-pay | Admitting: Cardiology

## 2016-12-17 VITALS — BP 114/72 | HR 62 | Ht 60.0 in | Wt 178.2 lb

## 2016-12-17 DIAGNOSIS — K625 Hemorrhage of anus and rectum: Secondary | ICD-10-CM | POA: Diagnosis not present

## 2016-12-17 DIAGNOSIS — E785 Hyperlipidemia, unspecified: Secondary | ICD-10-CM | POA: Diagnosis not present

## 2016-12-17 DIAGNOSIS — I482 Chronic atrial fibrillation: Secondary | ICD-10-CM

## 2016-12-17 DIAGNOSIS — I350 Nonrheumatic aortic (valve) stenosis: Secondary | ICD-10-CM

## 2016-12-17 DIAGNOSIS — I10 Essential (primary) hypertension: Secondary | ICD-10-CM

## 2016-12-17 DIAGNOSIS — Z7901 Long term (current) use of anticoagulants: Secondary | ICD-10-CM | POA: Diagnosis not present

## 2016-12-17 DIAGNOSIS — I4821 Permanent atrial fibrillation: Secondary | ICD-10-CM

## 2016-12-17 MED ORDER — BISOPROLOL FUMARATE 10 MG PO TABS: 5.0000 mg | ORAL_TABLET | Freq: Every day | ORAL | 3 refills | Status: DC

## 2016-12-17 NOTE — Patient Instructions (Signed)
MEDICATION CHANGES  -DECREASE BISOPROLOL TO 1/2 TABLET OF DAY ,IF PALPITION INCREASE YOU TAKE THE OTHER 1/2 TABLET.   NO OTHER CHANGES     Your physician wants you to follow-up in 12 months with Dr Herbie Baltimore. You will receive a reminder letter in the mail two months in advance. If you don't receive a letter, please call our office to schedule the follow-up appointment.

## 2016-12-17 NOTE — Progress Notes (Signed)
PCP: Deland Pretty, MD  Clinic Note: Chief Complaint  Patient presents with  . Follow-up    Pt. states experiencing occassional dizziness   . Atrial Fibrillation    Moderate irregular stenosis    HPI: Makayla Thomas is a 83 y.o. female who is being seen today for Annual follow-up for chronic atrial fibrillation and mild-moderate aortic stenosis at the request of Deland Pretty, MD.  Makayla Thomas was last seen on 12/16/2015  Recent Hospitalizations: December 12 she went in for blood in her stool was thought to be related to wiping and hemorrhoids. Hemoglobin was stable.  Studies Personally Reviewed - if available, images/films reviewed: From Epic Chart or Care EveryWhere: n/a  Interval History: Makayla Thomas presents today really without any major complaints are cardiac standpoint. She has no sensation of being in atrial fibrillation has not had any associated heart failure symptoms of PND or orthopnea. She does have some mild lower TIMI edema but is not overly bothered by it. She only takes about half of her Lasix dose daily if that. She denies any chest tightness or pressure rest or exertion.   No palpitations (nothing to suggest recurrence of A. fib), lightheadedness, weakness or syncope/near syncope. No TIA/amaurosis fugax symptoms. No melena, hematochezia, hematuria, or epstaxis. No claudication.  She does have dizziness that she notices mostly when she is having upper). It's usually associated with ulnar arms up to [place] close up. Was happens she feels fatigued. Otherwise fine. Notes that she is not as able to do things that she used to be able to do.  ROS: A comprehensive was performed. Pertinent positives noted above Review of Systems  Constitutional: Positive for malaise/fatigue (Not as much energy). Negative for weight loss.  HENT: Negative for congestion and nosebleeds.   Respiratory: Negative for cough and wheezing.   Gastrointestinal: Negative for heartburn.    Musculoskeletal: Positive for joint pain. Negative for falls.  Neurological: Positive for dizziness.  Endo/Heme/Allergies: Negative for environmental allergies.  Psychiatric/Behavioral: Negative for memory loss. The patient is not nervous/anxious and does not have insomnia.   All other systems reviewed and are negative.  I have reviewed and (if needed) personally updated the patient's problem list, medications, allergies, past medical and surgical history, social and family history.   Past Medical History:  Diagnosis Date  . Anemia   . Aortic stenosis, moderate (was mild) 08/2009; 08/2014   a. ECHO  EF 55-60%, wth increased EDP, mild AS;; b. mild Conc LVH, EF 60-65%, DD with high LVEDP, MIld-Mod AS, Mod MAC with Mod MR, mild-mod PA HTN.  [name] Arthritis   . Borderline diabetes   . Chronic anticoagulation    on Xarelto  . Complication of anesthesia    hallusinations  . Constipation   . Diabetes mellitus without complication ([place])    possibly  . Dyslipidemia    treated  . Dysrhythmia    A-FIB  . H/O cardiovascular stress test 05/2010   Lexiscan cardiolite no ischemia or infarction  . H/O multiple sclerosis    no excaerbations in some time  . History of ETT 02/2011   [place] exercise protocol, negative with poor exercise tolerance  . History of transfusion   . Hypertension   . Hypothyroidism   . Lower extremity edema    chronic  . PAD (peripheral artery disease) ([place]) 08/30/2009   Dopplers; with bil. post. tib artery occlusion  . Permanent atrial fibrillation ([place])   . Transient ischemic attack (TIA)    NO RESIDUAL PROBLEMS  .  Vaginal itching   . Visual disturbances    "FLASHING IN MY EYES"    Past Surgical History:  Procedure Laterality Date  . BREAST SURGERY     L tumor removed - benign  . CESAREAN SECTION     hx of 3  . CHOLECYSTECTOMY    . JOINT REPLACEMENT  2012   left total knee  . ORIF PERIPROSTHETIC FRACTURE Right 02/07/2015   Procedure: OPEN REDUCTION INTERNAL  FIXATION (ORIF) PERIPROSTHETIC FRACTURE WITH TOTAL HIP REVISION AND CABLES;  Surgeon: Paralee Cancel, MD;  Location: [facility];  Service: Orthopedics;  Laterality: Right;  . throidectomy partial     lt  . TOTAL HIP ARTHROPLASTY Right 01/29/2015   Procedure: RIGHT TOTAL HIP ARTHROPLASTY ANTERIOR APPROACH;  Surgeon: Paralee Cancel, MD;  Location: [facility];  Service: Orthopedics;  Laterality: Right;    Current Meds  Medication Sig  . acetaminophen (TYLENOL) 325 MG tablet Take 1-2 tablets (325-650 mg total) by mouth every 6 (six) hours as needed for moderate pain.  [name] amoxicillin-clavulanate (AUGMENTIN) 500-125 MG tablet Take 1 tablet by mouth 2 (two) times daily. Started on 07-24-16 for 7 days  . bisoprolol (ZEBETA) 10 MG tablet Take 0.5 tablets (5 mg total) by mouth daily.  [name] CARTIA XT 120 MG 24 hr capsule TAKE ONE CAPSULE BY MOUTH AT BEDTIME  . cholecalciferol (VITAMIN D) 1000 UNITS tablet Take 1,000 Units by mouth every morning.   . furosemide (LASIX) 20 MG tablet Take 10-20 mg by mouth every morning.   . hydrocortisone (ANUSOL-HC) 2.5 % rectal cream Apply rectally 2 times daily  . levothyroxine (SYNTHROID, LEVOTHROID) 125 MCG tablet Take 125 mcg by mouth daily before breakfast.  . Pitavastatin Calcium (LIVALO) 1 MG TABS Take 1 mg by mouth daily.  . Rivaroxaban (XARELTO) 15 MG TABS tablet Take 1 tablet (15 mg total) by mouth daily with breakfast. (Patient taking differently: Take 15 mg by mouth daily with supper. )  . sertraline (ZOLOFT) 50 MG tablet Take 50 mg by mouth daily.  [name] triamterene-hydrochlorothiazide (MAXZIDE-25) 37.5-25 MG per tablet Take 0.5 tablets by mouth every morning.   . [DISCONTINUED] bisoprolol (ZEBETA) 10 MG tablet Take 1 tablet (10 mg total) by mouth daily.    Allergies  Allergen Reactions  . Tetanus Antitoxin Anaphylaxis    Throat swelling  . Meclizine Other (See Comments)    Had funny feeling  . Tramadol Other (See Comments)    hallucinations    Social History    Social History  . Marital status: Widowed    Spouse name: N/A  . Number of children: 3  . Years of education: HS   Occupational History  . Retired    Social History Main Topics  . Smoking status: Never Smoker  . Smokeless tobacco: Never Used  . Alcohol use No  . Drug use: No  . Sexual activity: Not Asked   Other Topics Concern  . None   Social History Narrative   Uses a walker to walk   Drinks 1-2 cups of coffee a day     family history includes Heart failure (age of onset: 42) in her father; Lung cancer in her father.  Wt Readings from Last 3 Encounters:  12/17/16 178 lb 3.2 oz (80.8 kg)  03/02/16 182 lb (82.6 kg)  01/22/16 182 lb (82.6 kg)    PHYSICAL EXAM BP 114/72   Pulse 62   Ht 5' (1.524 m)   Wt 178 lb 3.2 oz (80.8 kg)   BMI  34.80 kg/m  General appearance: alert, cooperative, appears stated age, no distress and Mildly obese. Pleasant mood and affect. Well-nourished and well-groomed. Neck: no adenopathy, no carotid bruit and no JVD Lungs: clear to auscultation bilaterally, normal percussion bilaterally and non-labored Heart: regular rate and irregularly irregular rhythm, S1 and S2 normal, no click, rub or gallop ; nondisplaced PMI. 1/6 SEM at RUSB. Abdomen: soft, non-tender; bowel sounds normal; no masses,  no organomegaly; no HJR Extremities: extremities normal, atraumatic, no cyanosis, and edema trace to 1+ (stable) Pulses: 2+ and symmetric;  reduced PT pulses bilaterally. Skin: no evidence of bleeding or bruising, no lesions noted, temperature normal and texture normal or  Neurologic: Mental status: Alert, oriented, thought content appropriate; nonfocal    Adult ECG Report  Rate: 62 ;  Rhythm: atrial fibrillation and Controlled rate. Otherwise normal axis, intervals and durations;   Narrative Interpretation: Stable EKG   Other studies Reviewed: Additional studies/ records that were reviewed today include:  Recent Labs:   Lab Results  Component  Value Date   CREATININE 1.03 (H) 07/28/2016   BUN 27 (H) 07/28/2016   NA 138 07/28/2016   K 3.7 07/28/2016   CL 103 07/28/2016   CO2 22 07/28/2016   Lab Results  Component Value Date   CHOL 53 09/01/2014   HDL 19 (L) 09/01/2014   LDLCALC 19 09/01/2014   TRIG 75 09/01/2014   CHOLHDL 2.8 09/01/2014     ASSESSMENT / PLAN: Problem List Items Addressed This Visit    Chronic anticoagulation (Chronic)   Dyslipidemia, goal LDL below 100 - due aortic stenosis (Chronic)    Management by PCP. Not currently on any medications.      Relevant Medications   Pitavastatin Calcium (LIVALO) 1 MG TABS   bisoprolol (ZEBETA) 10 MG tablet   Essential hypertension -well controlled (Chronic)    Relatively well-controlled with bisoprolol and triamterene.      Relevant Medications   Pitavastatin Calcium (LIVALO) 1 MG TABS   bisoprolol (ZEBETA) 10 MG tablet   Other Relevant Orders   EKG 12-Lead   Moderate aortic stenosis (Chronic)    Murmur sounds relatively the same. She is a symptomatically I think we can probably just wait until next year to follow-up with her echocardiogram. Completely asymptomatic, and given her age, she may not be interested in surgical repair.      Relevant Medications   Pitavastatin Calcium (LIVALO) 1 MG TABS   bisoprolol (ZEBETA) 10 MG tablet   Other Relevant Orders   EKG 12-Lead   Permanent atrial fibrillation ([place]); CHA2DS2-VASc Score 6 - Primary (Chronic)    Pretty much asymptomatic. Rate controlled. I think we may get a back off some of her beta blocker to allow for more blood pressure and heart rate responsiveness. Plan: Reduce bisoprolol 5 mg daily. Continue Xarelto at reduced dose.for anticoagulation      Relevant Medications   Pitavastatin Calcium (LIVALO) 1 MG TABS   bisoprolol (ZEBETA) 10 MG tablet   Other Relevant Orders   EKG 12-Lead   Rectal bleeding    I think this sounds like a hemorrhoidal bleed. Told her if she has a significant bleed she  should hold her Xarelto for couple days.         Current medicines are reviewed at length with the patient today. (+/- concerns) A little bit of fatigue The following changes have been made: See below  Patient Instructions  MEDICATION CHANGES  -DECREASE BISOPROLOL TO 1/2 TABLET OF DAY ,IF PALPITION  INCREASE YOU TAKE THE OTHER 1/2 TABLET.   NO OTHER CHANGES     Your physician wants you to follow-up in 12 months with Dr Ellyn Hack. You will receive a reminder letter in the mail two months in advance. If you don't receive a letter, please call our office to schedule the follow-up appointment.    Studies Ordered:   Orders Placed This Encounter  Procedures  . EKG 12-Lead      Glenetta Hew, M.D., M.S. Interventional Cardiologist   Pager # [phone] Phone # [phone] [address] [place], [place] [address]

## 2016-12-18 DIAGNOSIS — N390 Urinary tract infection, site not specified: Secondary | ICD-10-CM | POA: Diagnosis not present

## 2016-12-18 DIAGNOSIS — G35 Multiple sclerosis: Secondary | ICD-10-CM | POA: Diagnosis not present

## 2016-12-19 ENCOUNTER — Encounter: Payer: Self-pay | Admitting: Cardiology

## 2016-12-19 NOTE — Assessment & Plan Note (Deleted)
Pretty much asymptomatic. Rate controlled. I think we may get a back off some of her beta blocker to allow for more blood pressure and heart rate responsiveness. Plan: Reduce bisoprolol 5 mg daily. Continue Xarelto for anticoagulation of reduced dose.

## 2016-12-19 NOTE — Assessment & Plan Note (Addendum)
Management by PCP. Not currently on any medications.

## 2016-12-19 NOTE — Assessment & Plan Note (Signed)
Murmur sounds relatively the same. She is a symptomatically I think we can probably just wait until next year to follow-up with her echocardiogram. Completely asymptomatic, and given her age, she may not be interested in surgical repair.

## 2016-12-19 NOTE — Assessment & Plan Note (Signed)
Relatively well-controlled with bisoprolol and triamterene.

## 2016-12-19 NOTE — Assessment & Plan Note (Addendum)
Pretty much asymptomatic. Rate controlled. I think we may get a back off some of her beta blocker to allow for more blood pressure and heart rate responsiveness. Plan: Reduce bisoprolol 5 mg daily. Continue Xarelto at reduced dose.for anticoagulation

## 2016-12-19 NOTE — Assessment & Plan Note (Signed)
I think this sounds like a hemorrhoidal bleed. Told her if she has a significant bleed she should hold her Xarelto for couple days.

## 2016-12-22 DIAGNOSIS — N390 Urinary tract infection, site not specified: Secondary | ICD-10-CM | POA: Diagnosis not present

## 2016-12-22 DIAGNOSIS — G35 Multiple sclerosis: Secondary | ICD-10-CM | POA: Diagnosis not present

## 2016-12-24 DIAGNOSIS — G35 Multiple sclerosis: Secondary | ICD-10-CM | POA: Diagnosis not present

## 2016-12-24 DIAGNOSIS — N390 Urinary tract infection, site not specified: Secondary | ICD-10-CM | POA: Diagnosis not present

## 2016-12-30 DIAGNOSIS — N390 Urinary tract infection, site not specified: Secondary | ICD-10-CM | POA: Diagnosis not present

## 2016-12-30 DIAGNOSIS — G35 Multiple sclerosis: Secondary | ICD-10-CM | POA: Diagnosis not present

## 2016-12-31 DIAGNOSIS — G35 Multiple sclerosis: Secondary | ICD-10-CM | POA: Diagnosis not present

## 2016-12-31 DIAGNOSIS — N390 Urinary tract infection, site not specified: Secondary | ICD-10-CM | POA: Diagnosis not present

## 2017-01-04 DIAGNOSIS — I10 Essential (primary) hypertension: Secondary | ICD-10-CM | POA: Diagnosis not present

## 2017-01-06 DIAGNOSIS — G35 Multiple sclerosis: Secondary | ICD-10-CM | POA: Diagnosis not present

## 2017-01-06 DIAGNOSIS — N390 Urinary tract infection, site not specified: Secondary | ICD-10-CM | POA: Diagnosis not present

## 2017-01-22 ENCOUNTER — Other Ambulatory Visit: Payer: Self-pay | Admitting: Cardiology

## 2017-01-22 NOTE — Telephone Encounter (Signed)
Rx(s) sent to pharmacy electronically.  

## 2017-01-28 DIAGNOSIS — N3941 Urge incontinence: Secondary | ICD-10-CM | POA: Diagnosis not present

## 2017-01-28 DIAGNOSIS — R350 Frequency of micturition: Secondary | ICD-10-CM | POA: Diagnosis not present

## 2017-01-28 DIAGNOSIS — N3944 Nocturnal enuresis: Secondary | ICD-10-CM | POA: Diagnosis not present

## 2017-01-28 DIAGNOSIS — N390 Urinary tract infection, site not specified: Secondary | ICD-10-CM | POA: Diagnosis not present

## 2017-02-20 ENCOUNTER — Other Ambulatory Visit: Payer: Self-pay | Admitting: Cardiology

## 2017-02-22 ENCOUNTER — Telehealth: Payer: Self-pay | Admitting: *Deleted

## 2017-02-22 NOTE — Telephone Encounter (Signed)
Patient left a msg on the refill vm stating that the bisoprolol was reduced to 5 mg qd, but the rx was sent in for 10 mg qd. She can be reached at [phone]. Thanks, MI

## 2017-02-23 ENCOUNTER — Telehealth: Payer: Self-pay | Admitting: Cardiology

## 2017-02-23 MED ORDER — BISOPROLOL FUMARATE 10 MG PO TABS: 10.0000 mg | ORAL_TABLET | Freq: Every day | ORAL | 3 refills | Status: DC

## 2017-02-23 NOTE — Telephone Encounter (Signed)
New message    Pt is calling because she said when she was here last they told her to call when she was out of medication for a new prescription. She is not sure for which medication.

## 2017-02-23 NOTE — Telephone Encounter (Signed)
Returned call to patient-patient thought Dr. Herbie Baltimore decreased a medication at her last OV but the new rx was not the new dosage.  Advised that Dr. Herbie Baltimore suggested per last OV note to take 1/2 bisoprolol 5 mg daily and she may take the other 1/2 5mg  AS NEEDED for palpitations.     Patient aware and verbalized understanding.

## 2017-02-23 NOTE — Telephone Encounter (Signed)
Per OV:  DECREASE BISOPROLOL TO 1/2 TABLET OF DAY ,IF PALPITION INCREASE YOU TAKE THE OTHER 1/2 TABLET.  Left detailed message(DPR)

## 2017-03-18 IMAGING — DX DG HIP (WITH OR WITHOUT PELVIS) 1V PORT*R*
2 series · 2 of 2 positions shown · non-contrast
Comparison: None.

CLINICAL DATA: Status post right hip replacement today.

EXAM:
RIGHT HIP (WITH PELVIS) 1 VIEW PORTABLE

[pelvis ap]
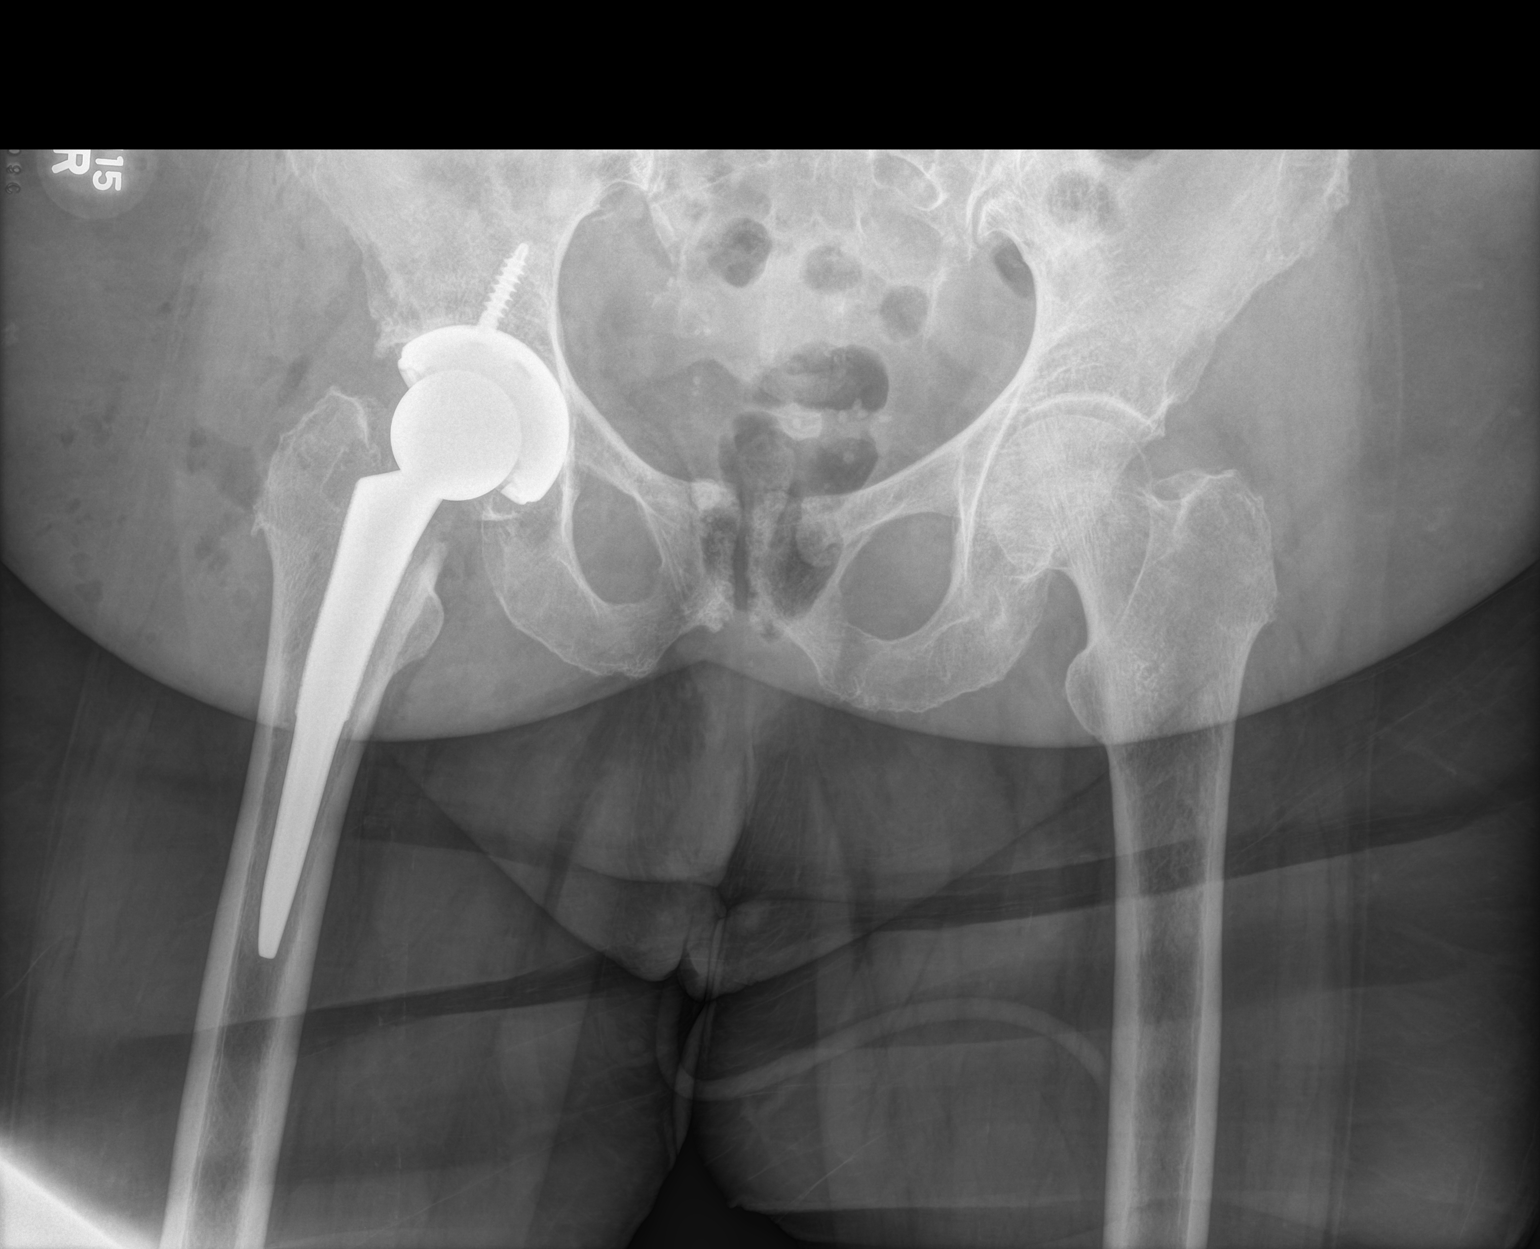

[hip x-table]
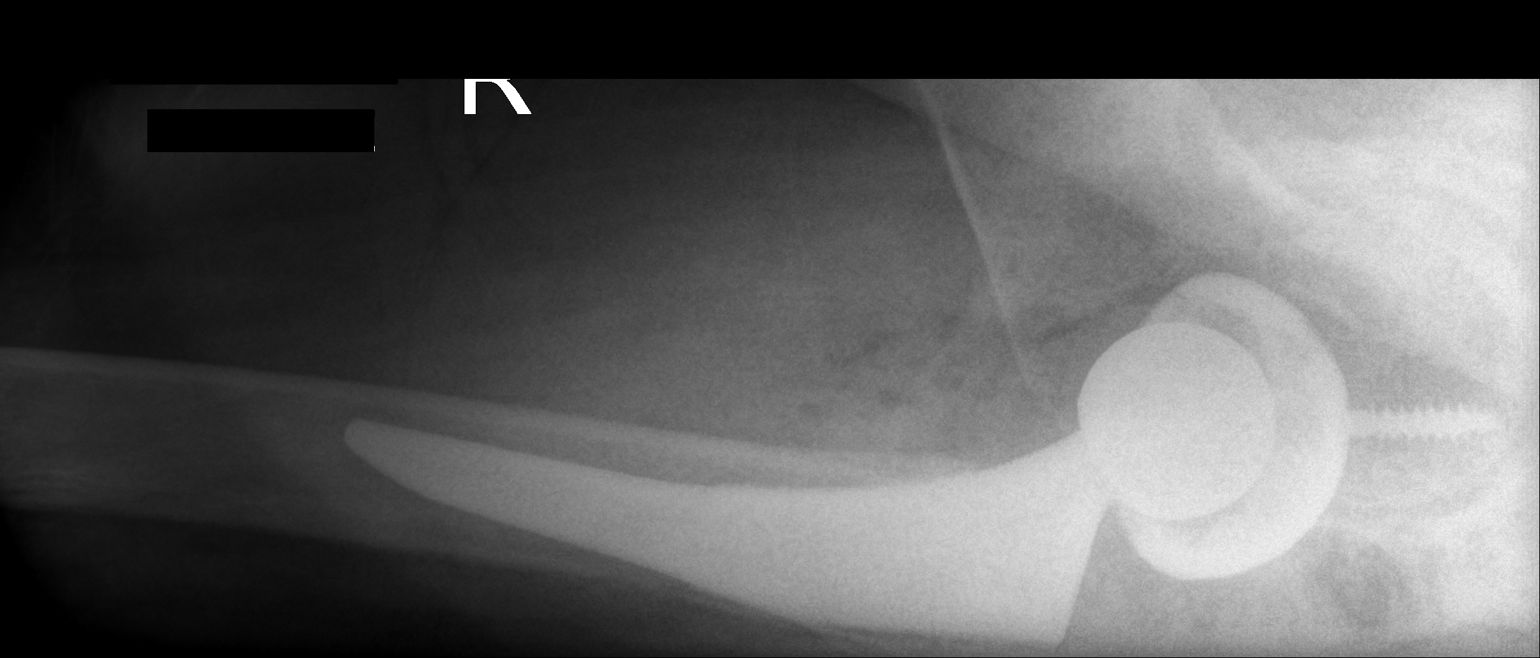

[2 of 2 positions shown; findings below may reference images not displayed]

FINDINGS: Right total hip arthroplasty is in place. The device is located and
no fracture is identified. Gas in the soft tissues from surgery is
noted.
IMPRESSION: Right total hip replacement without complicating feature.

## 2017-03-23 DIAGNOSIS — M25561 Pain in right knee: Secondary | ICD-10-CM | POA: Diagnosis not present

## 2017-03-23 DIAGNOSIS — M170 Bilateral primary osteoarthritis of knee: Secondary | ICD-10-CM | POA: Diagnosis not present

## 2017-03-25 IMAGING — CR DG PELVIS 1-2V
1 series · 1 of 1 positions shown · non-contrast
Comparison: None.

CLINICAL DATA: Patient with recent proximal right femur fracture.

EXAM:
PELVIS - 1-2 VIEW

[t pelvis ap]
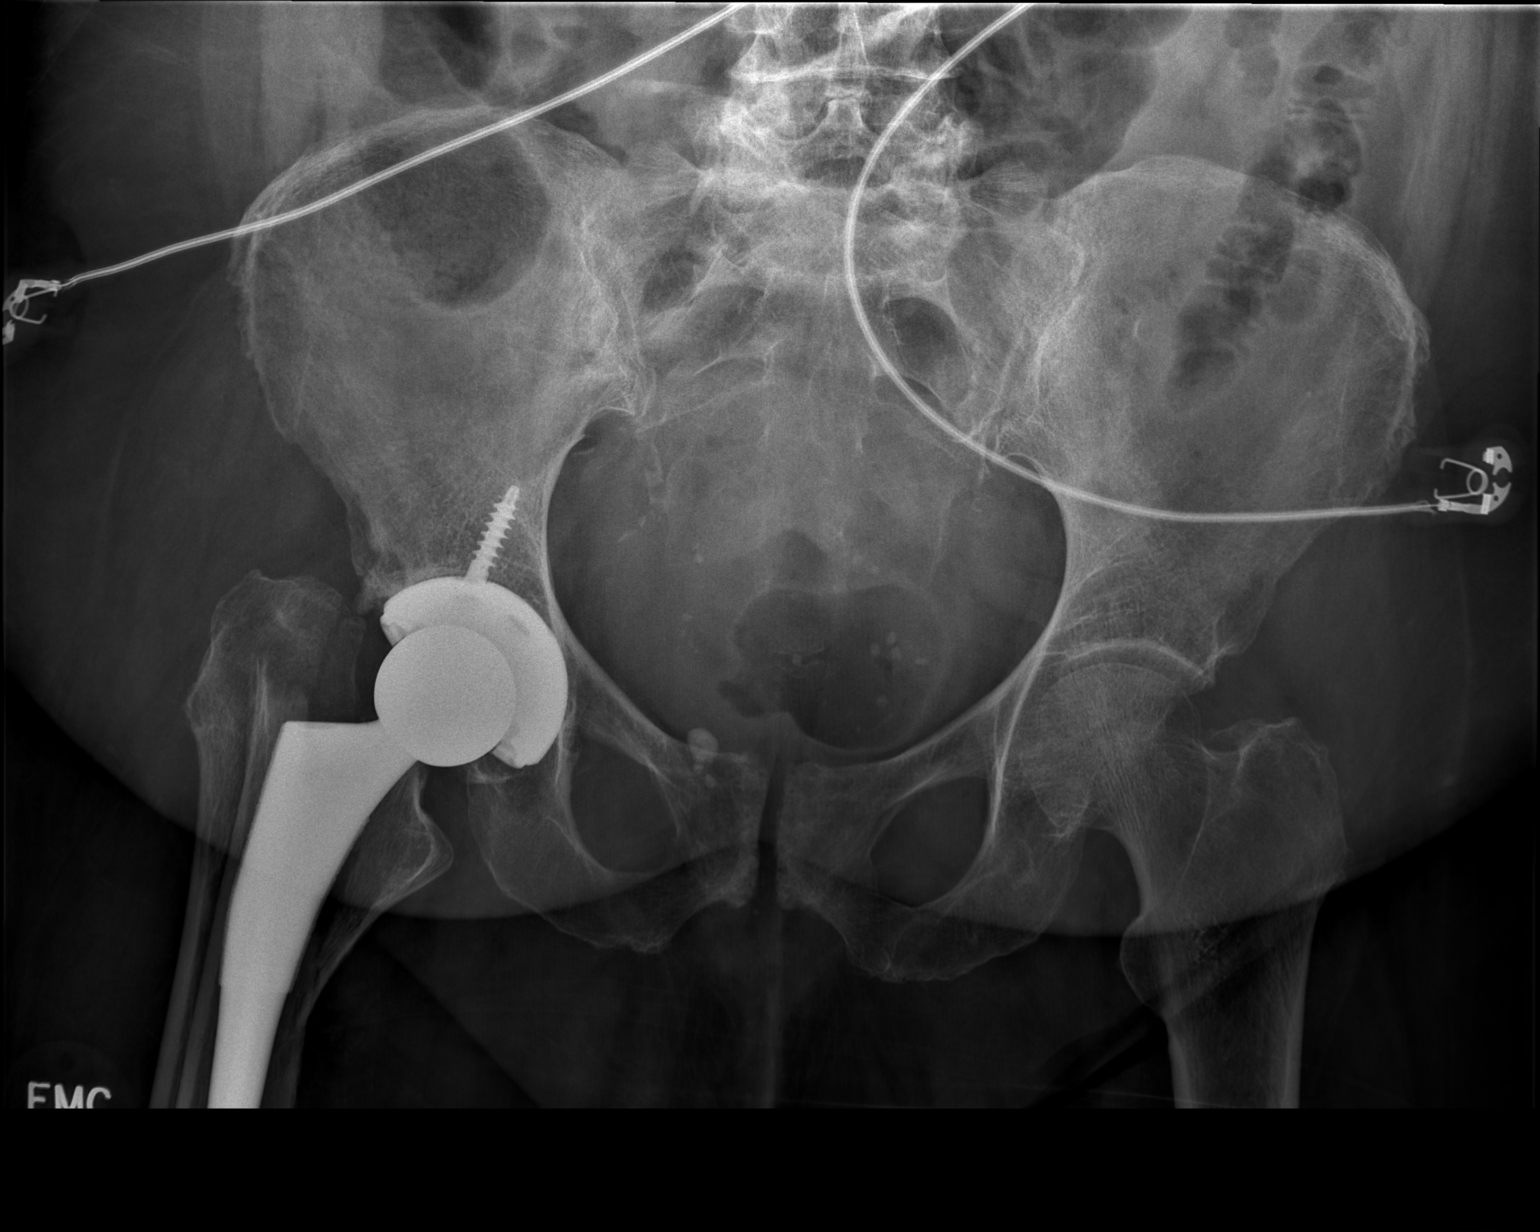

[1 of 1 positions shown; findings below may reference images not displayed]

FINDINGS: Re- demonstrated comminuted proximal right femur fracture about
right femoral hardware. Lower lumbar spine degenerative changes. SI
joints are unremarkable. Pelvic phleboliths.
IMPRESSION: Re- demonstrated comminuted proximal right femur fracture about the
hardware. Pelvic ring remains intact.

## 2017-03-25 IMAGING — CR DG FEMUR 2+V*R*
5 series · 5 of 5 positions shown · non-contrast
Comparison: 01/29/2015

CLINICAL DATA: Patient with recent right hip replacement. Prior
radiograph showing proximal right femur fracture.

EXAM:
RIGHT FEMUR 2 VIEWS

[t femur proximal ap right]
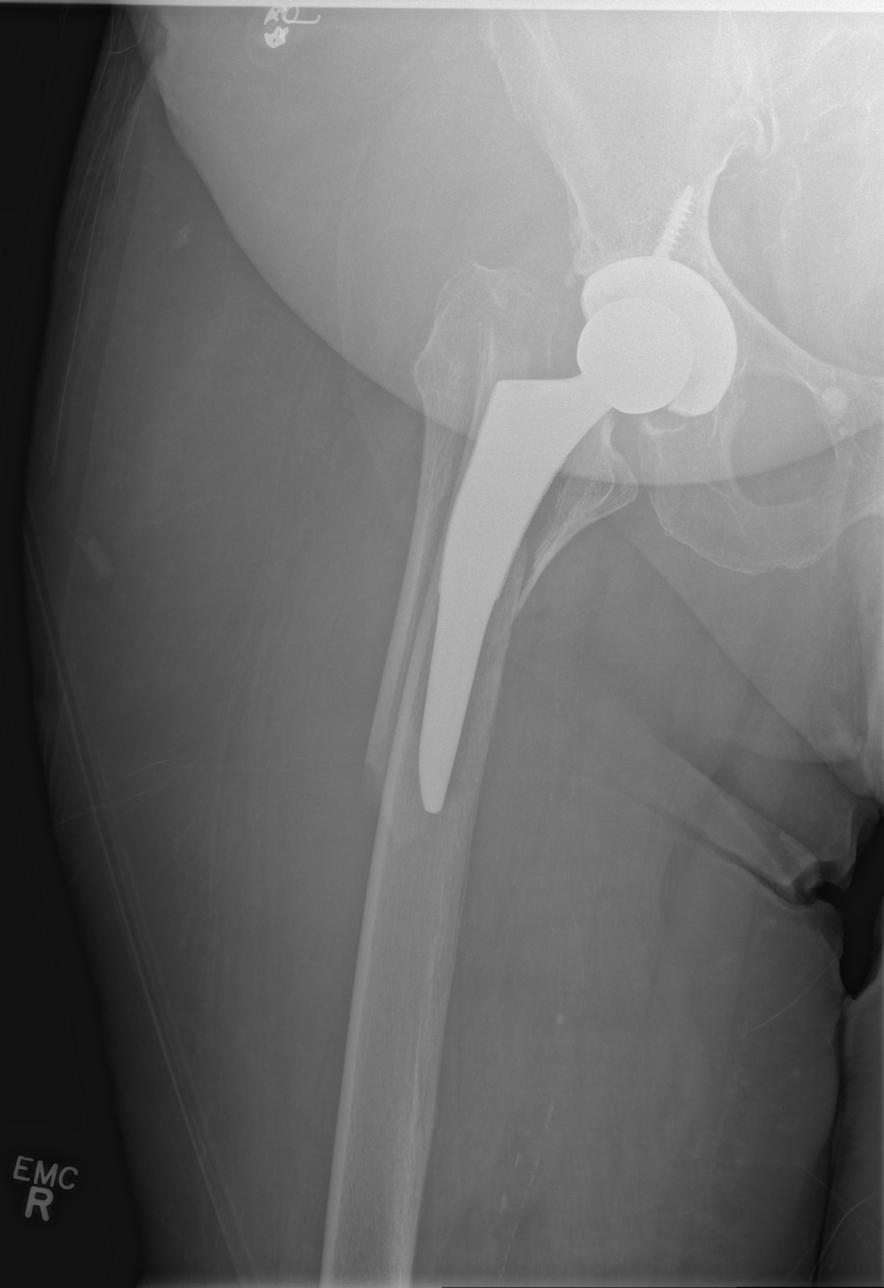

[t femur distal ap right]
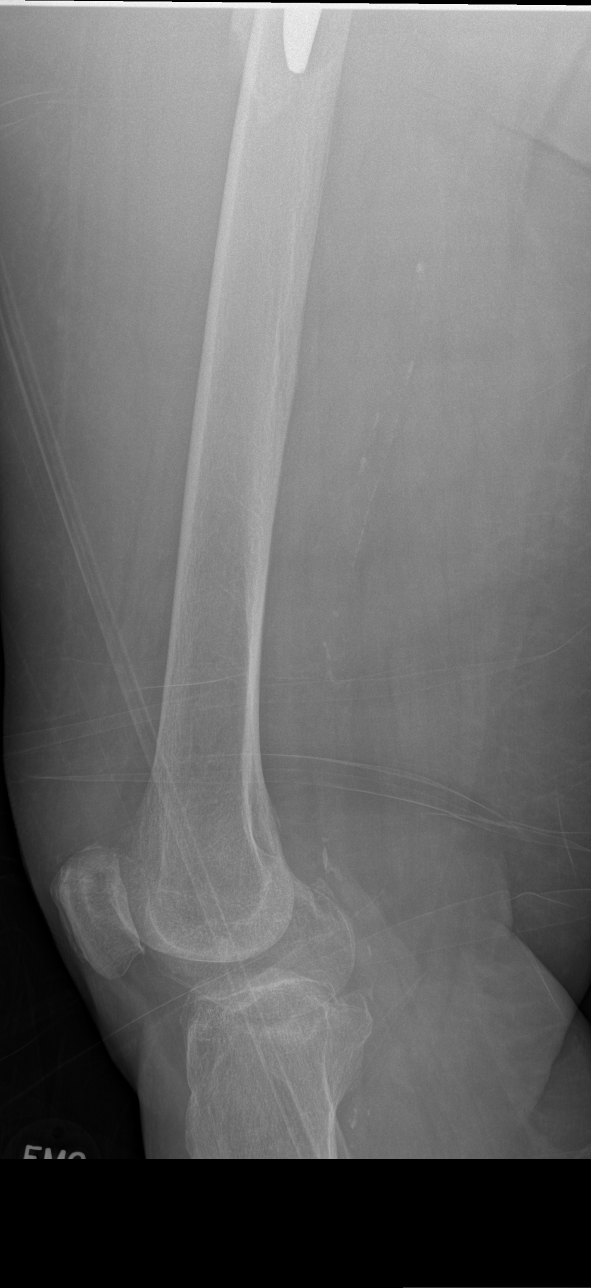

[w femur distal lat right]
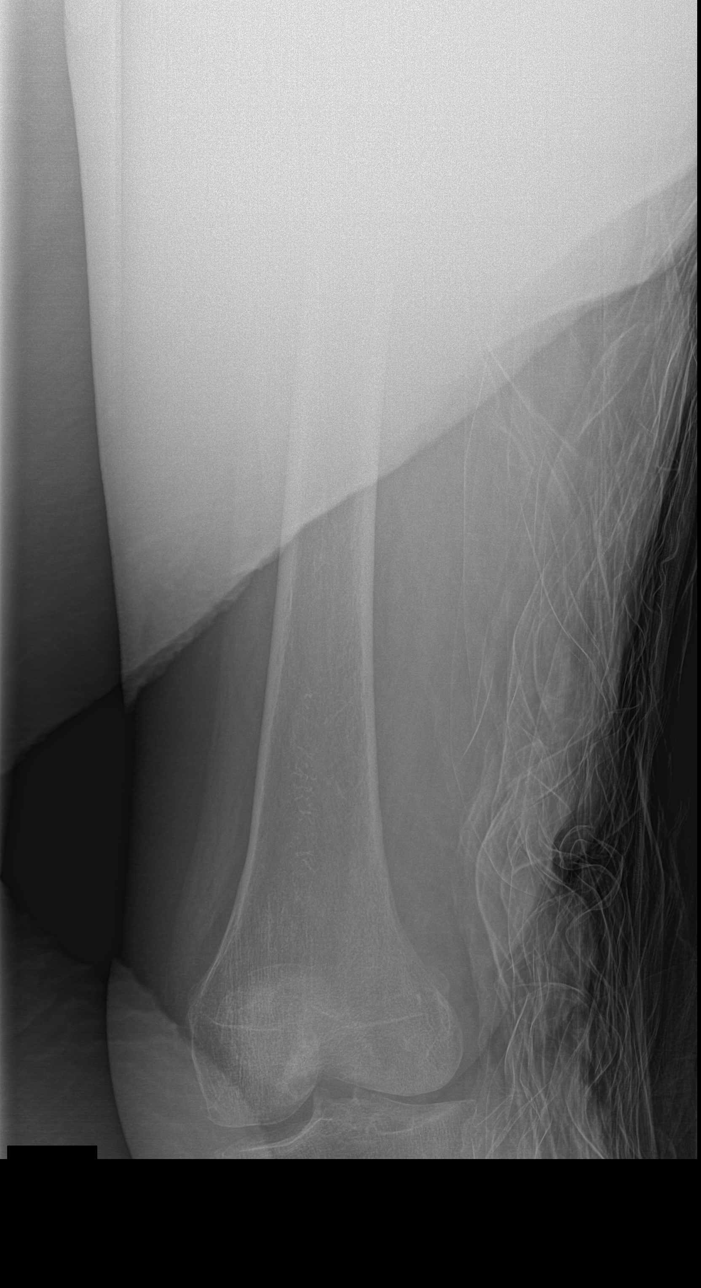

[w hip lat right (1 of 2)]
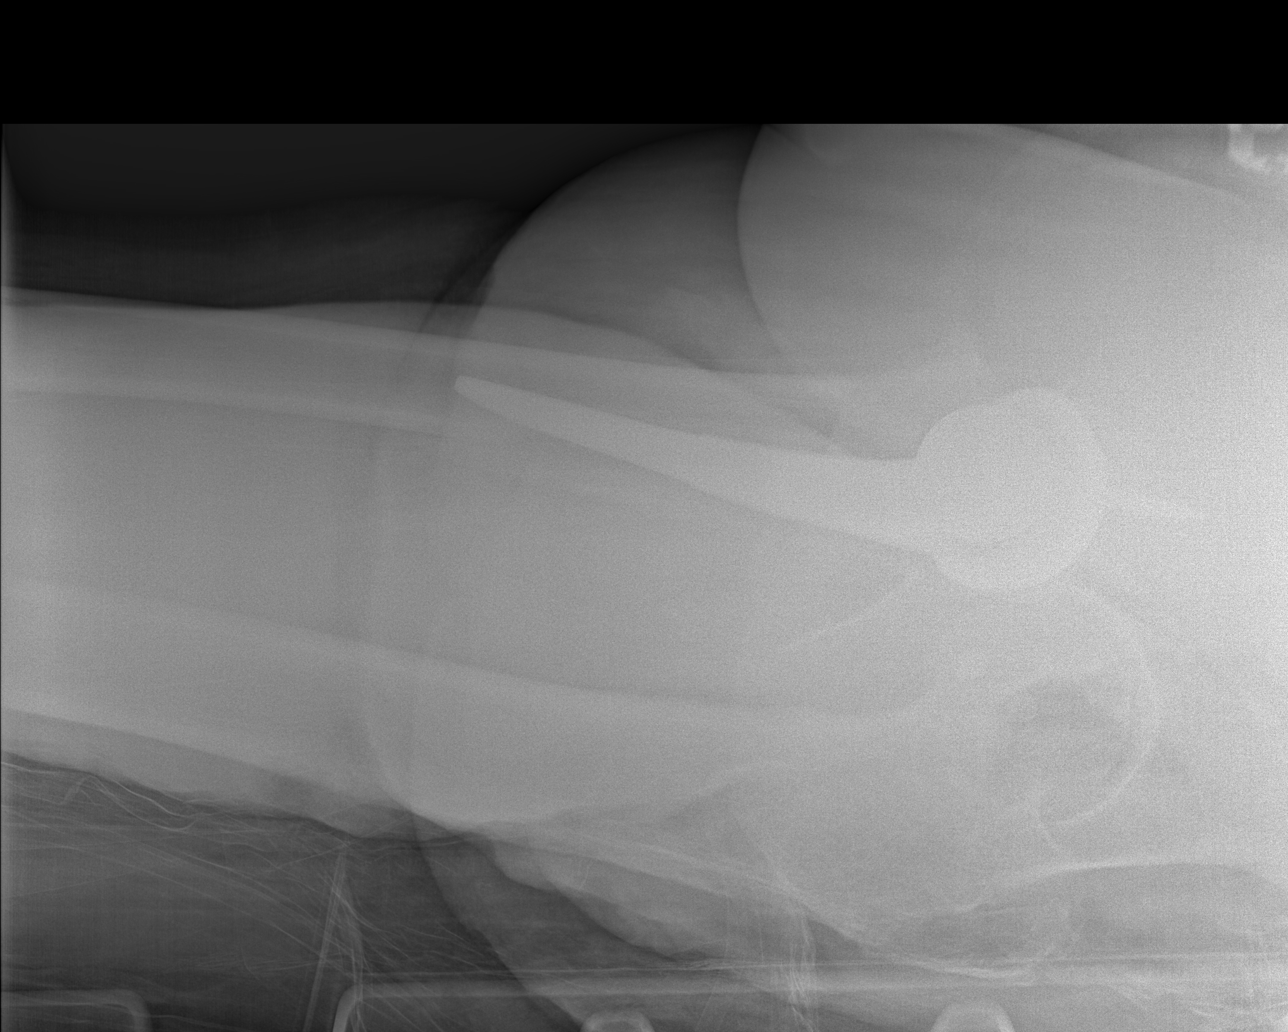

[w hip lat right (2 of 2)]
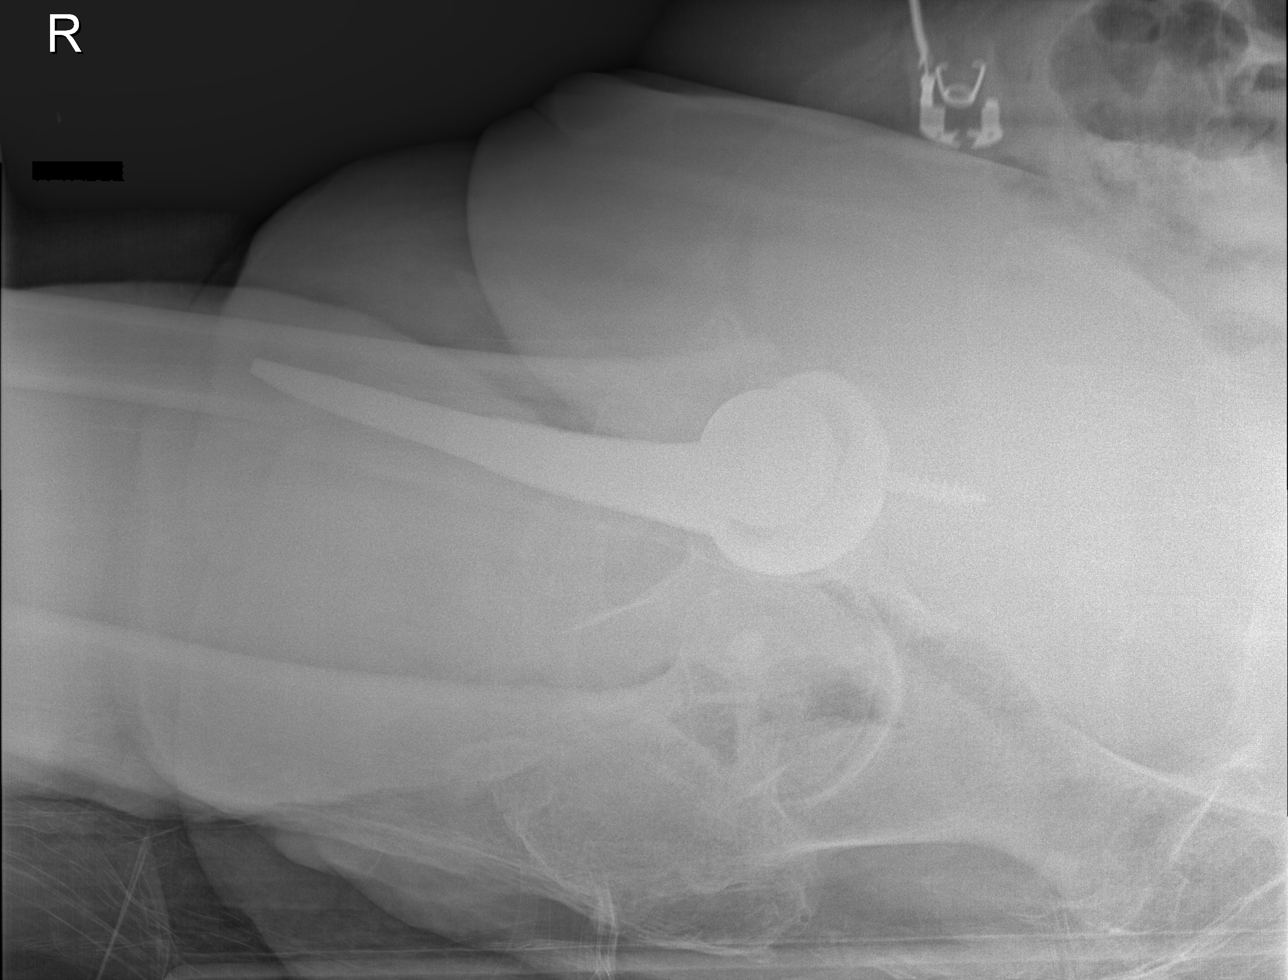

[5 of 5 positions shown; findings below may reference images not displayed]

FINDINGS: Patient status post right hip arthroplasty. Comminuted fracture of
the proximal right femur with associated foreshortening.
Degenerative changes of the knee. Vascular calcifications. Pelvic
phleboliths.
IMPRESSION: Comminuted proximal right femur fracture with associated
foreshortening about the femoral stem of the right hip replacement.

## 2017-03-25 IMAGING — CR DG CHEST 1V
1 series · 1 of 1 positions shown · non-contrast
Comparison: Chest radiograph 08/29/2014

CLINICAL DATA: Patient with prior right hip replacement. Recent
diagnosis right femur fracture.

EXAM:
CHEST  1 VIEW

[t chest supine]
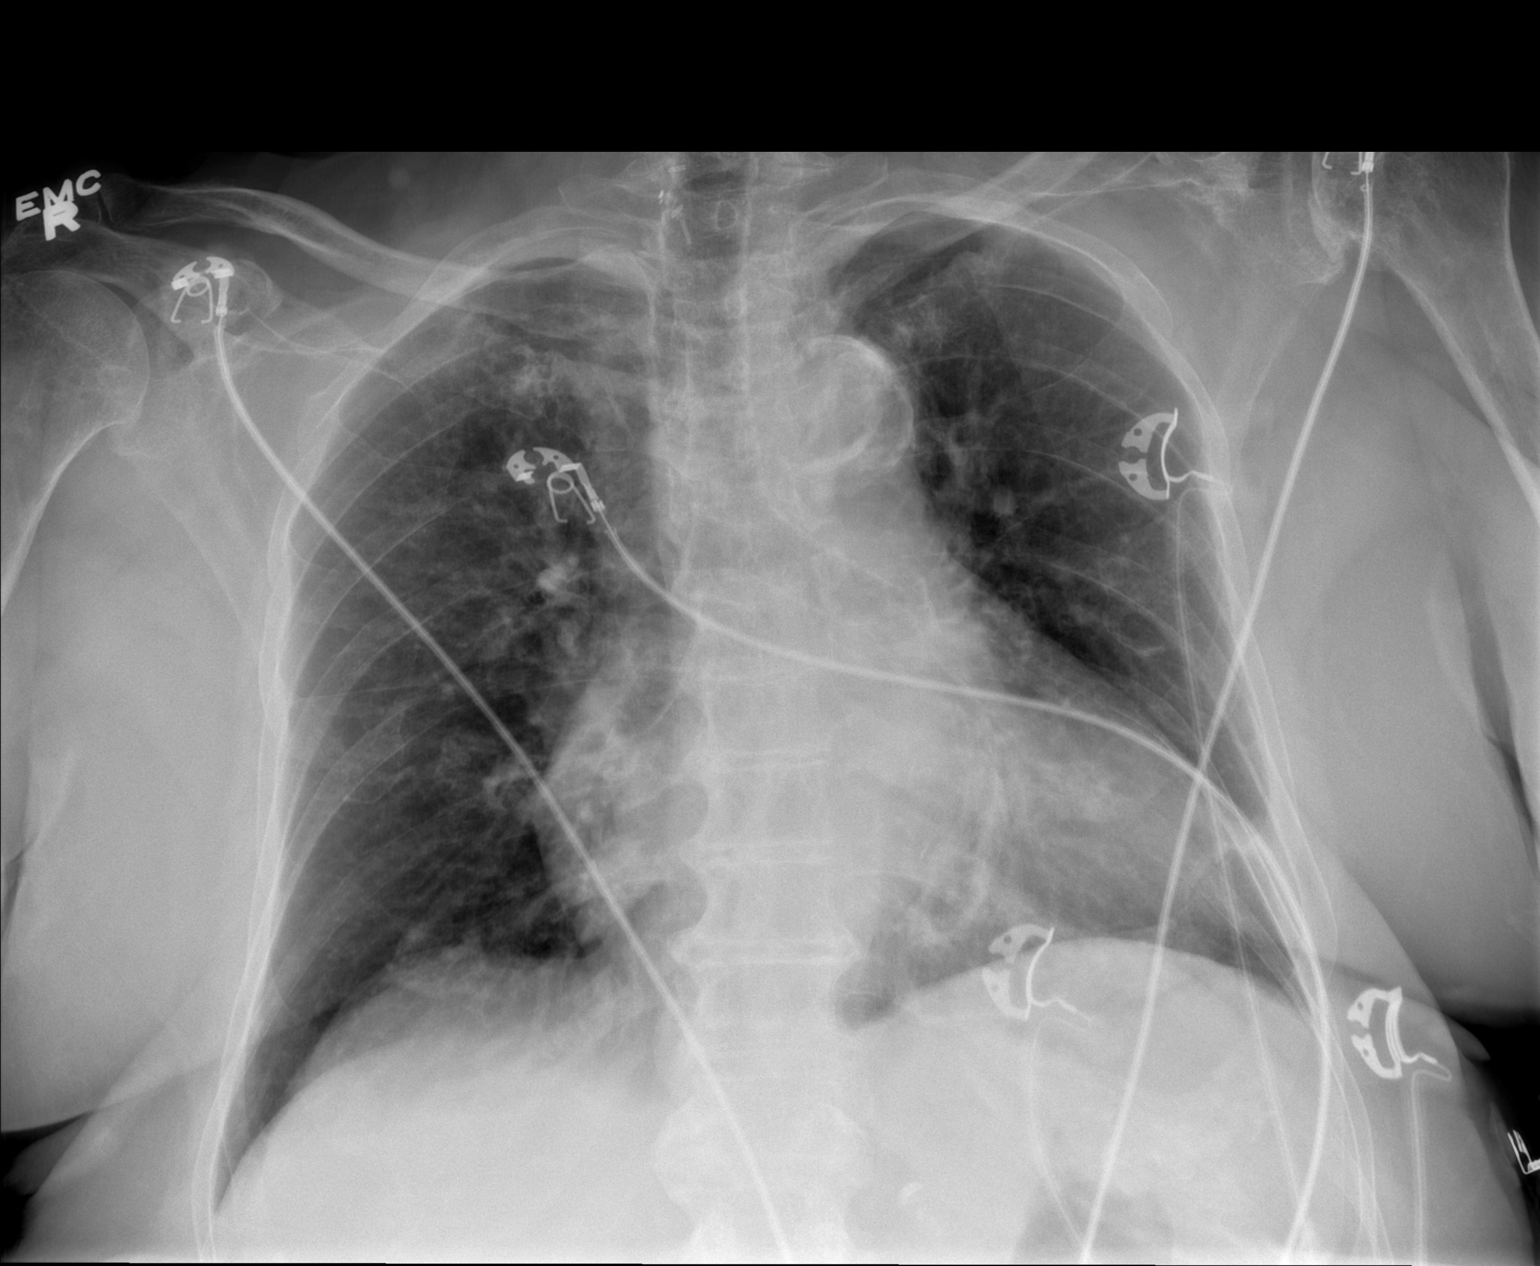

[1 of 1 positions shown; findings below may reference images not displayed]

FINDINGS: Monitoring leads overlie the patient. Stable enlarged cardiac and
mediastinal contours with tortuosity and calcification of the
thoracic aorta. Bilateral coarse interstitial opacities. No large
consolidative pulmonary opacity. No pleural effusion or
pneumothorax.
IMPRESSION: Cardiomegaly.

No acute cardiopulmonary process.

## 2017-03-27 IMAGING — DX DG HIP (WITH OR WITHOUT PELVIS) 2-3V*R*
1 series · 1 of 1 positions shown · non-contrast
Comparison: Plain films the right hip 01/29/2015.

CLINICAL DATA: Revision of right hip replacement.

EXAM:
RIGHT HIP (WITH PELVIS) 2-3 VIEWS

[hip ap]
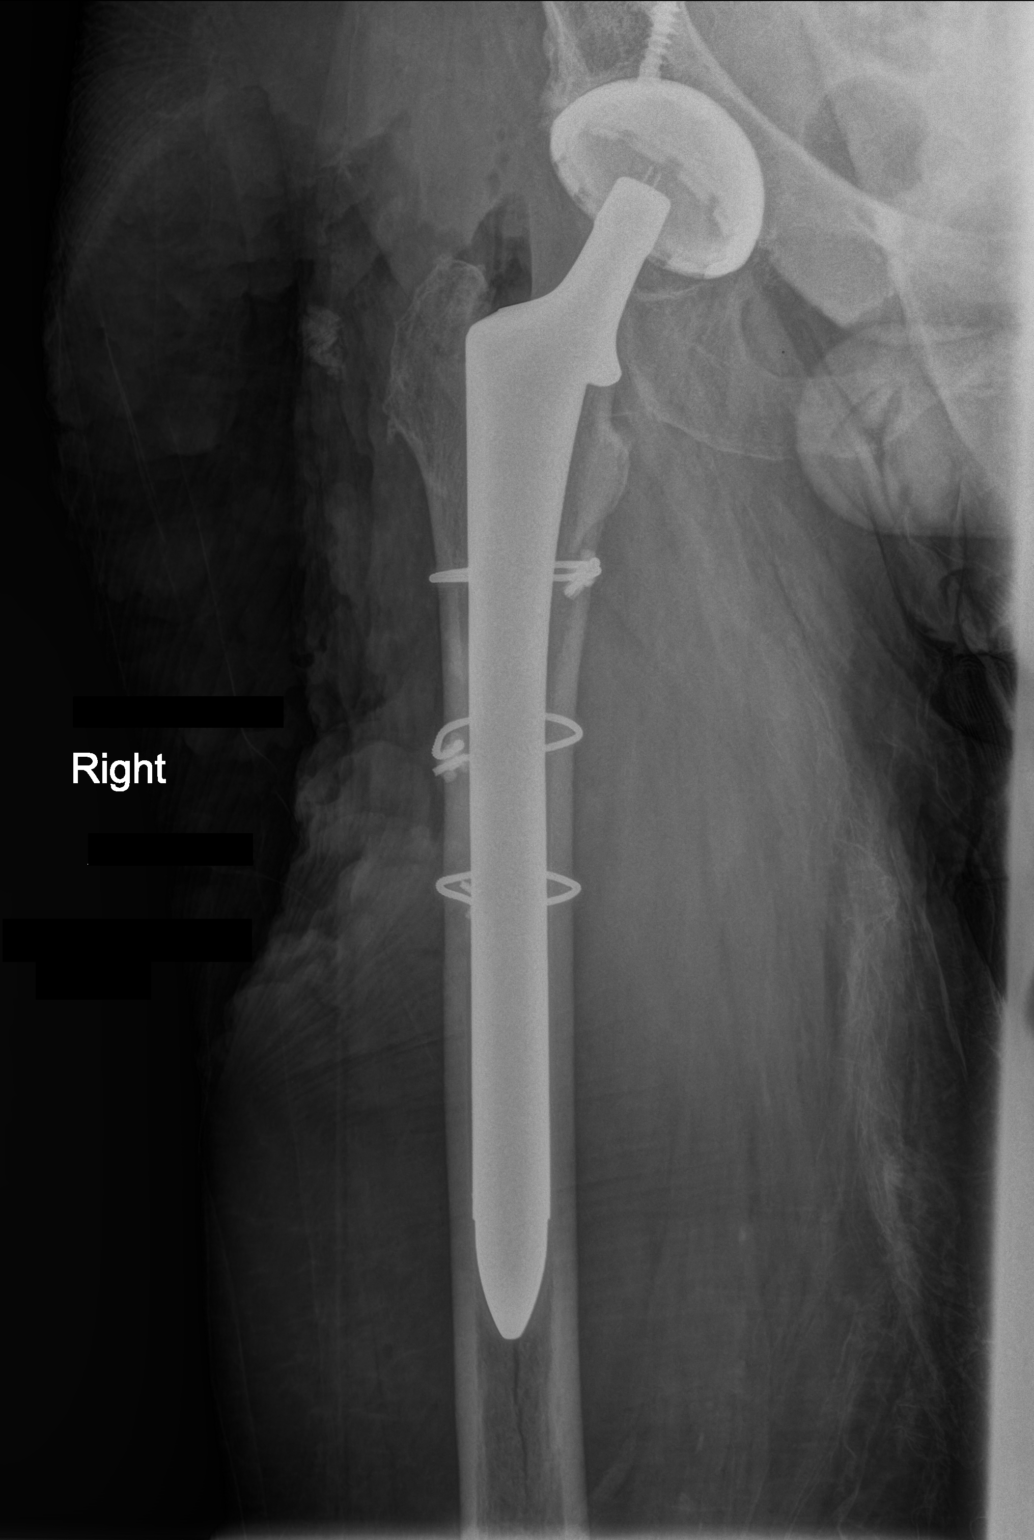

[1 of 1 positions shown; findings below may reference images not displayed]

FINDINGS: Single intraoperative view of the right hip is identified. Surgical
soft tissue wound is identified. The patient has a new long stem
normal finding in place. Cerclage wires are identified scratch at 3
cerclage wires are present about the stem. There is a periprosthetic
fracture off the tip of the stem.
IMPRESSION: Revision the femoral component of right hip replacement.
Periprosthetic femur fracture is seen off the tip of the new long
femoral stem.

Critical Value/emergent results were called by telephone at the time
of interpretation on 02/07/2015 at [DATE] to Saharsh Mehek, RN., the
patient's nurse who verbally acknowledged these results.

## 2017-03-27 IMAGING — CR DG PORTABLE PELVIS
1 series · 2 of 2 positions shown · non-contrast
Comparison: 02/05/2015. Right femur series from 4235 hours on
02/07/2015.

CLINICAL DATA: 89-year-old female status post right hip surgery.
Initial encounter.

EXAM:
PORTABLE PELVIS 1-2 VIEWS

[Series 1: AP · U · 2 of 2 slices shown]
[im 1/2]
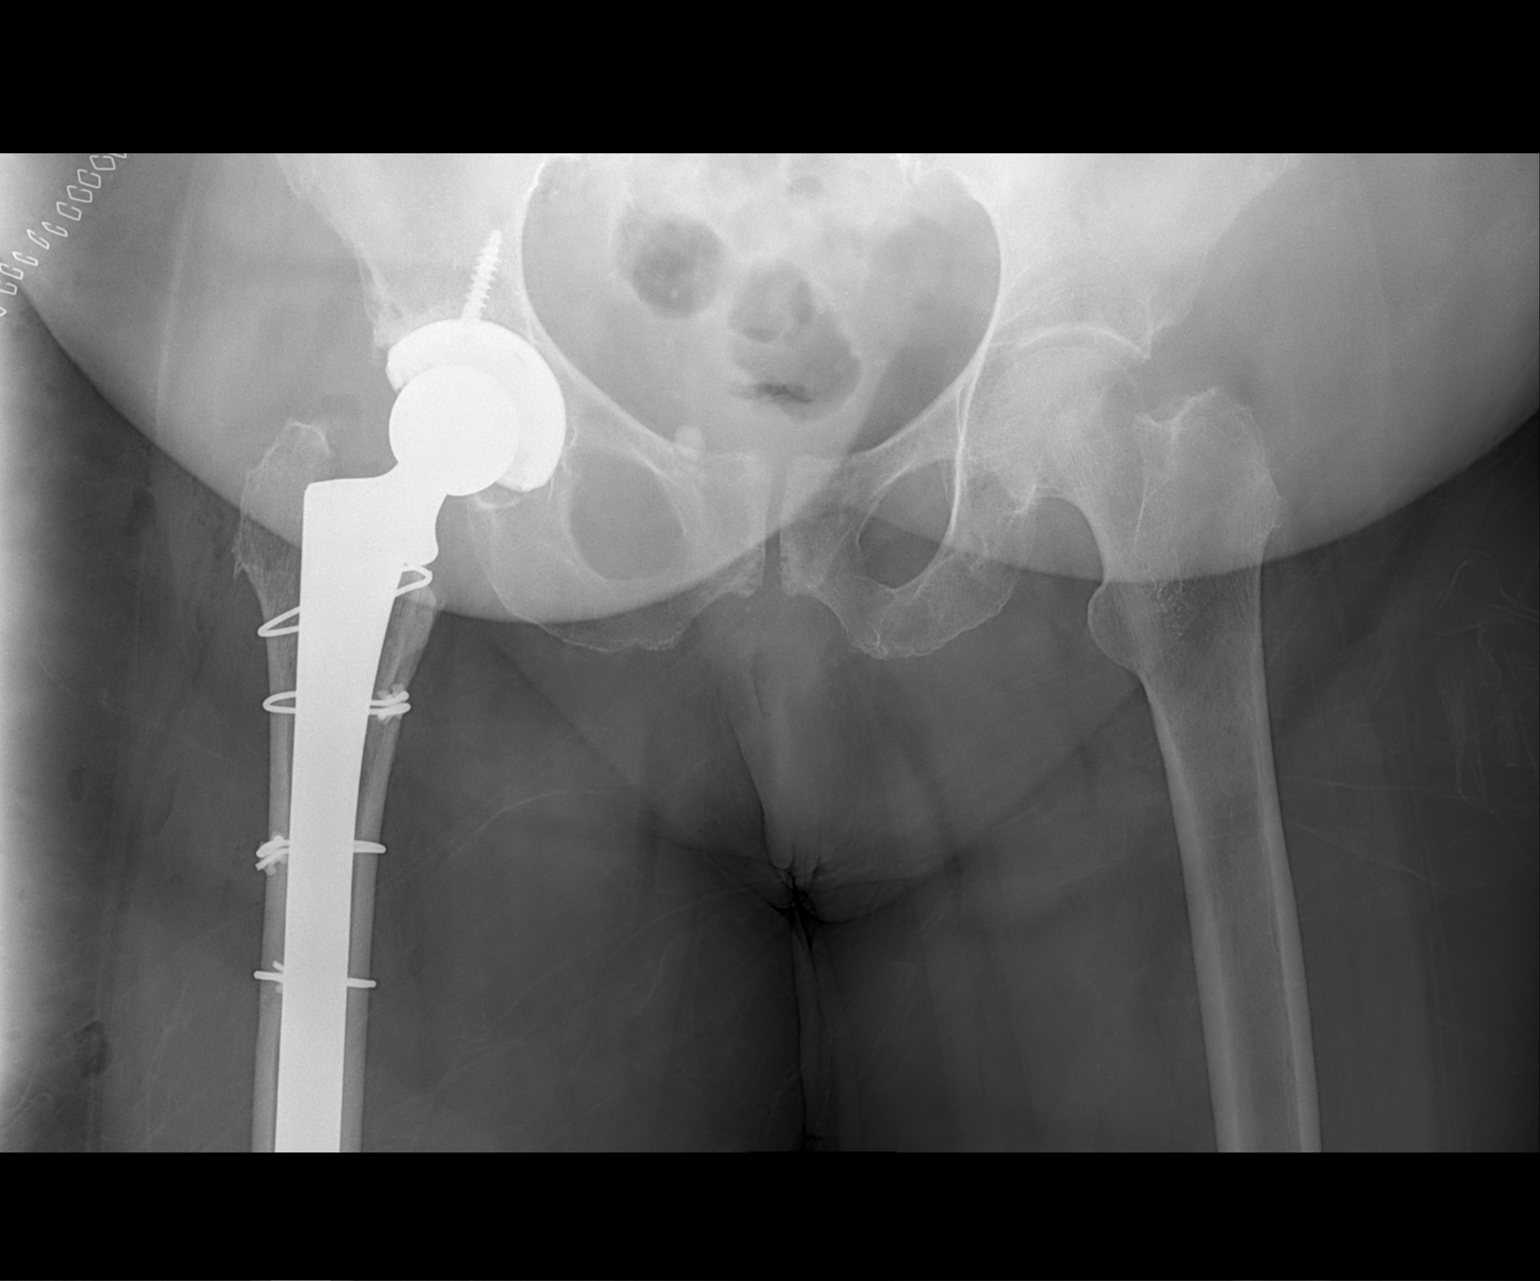
[im 2/2]
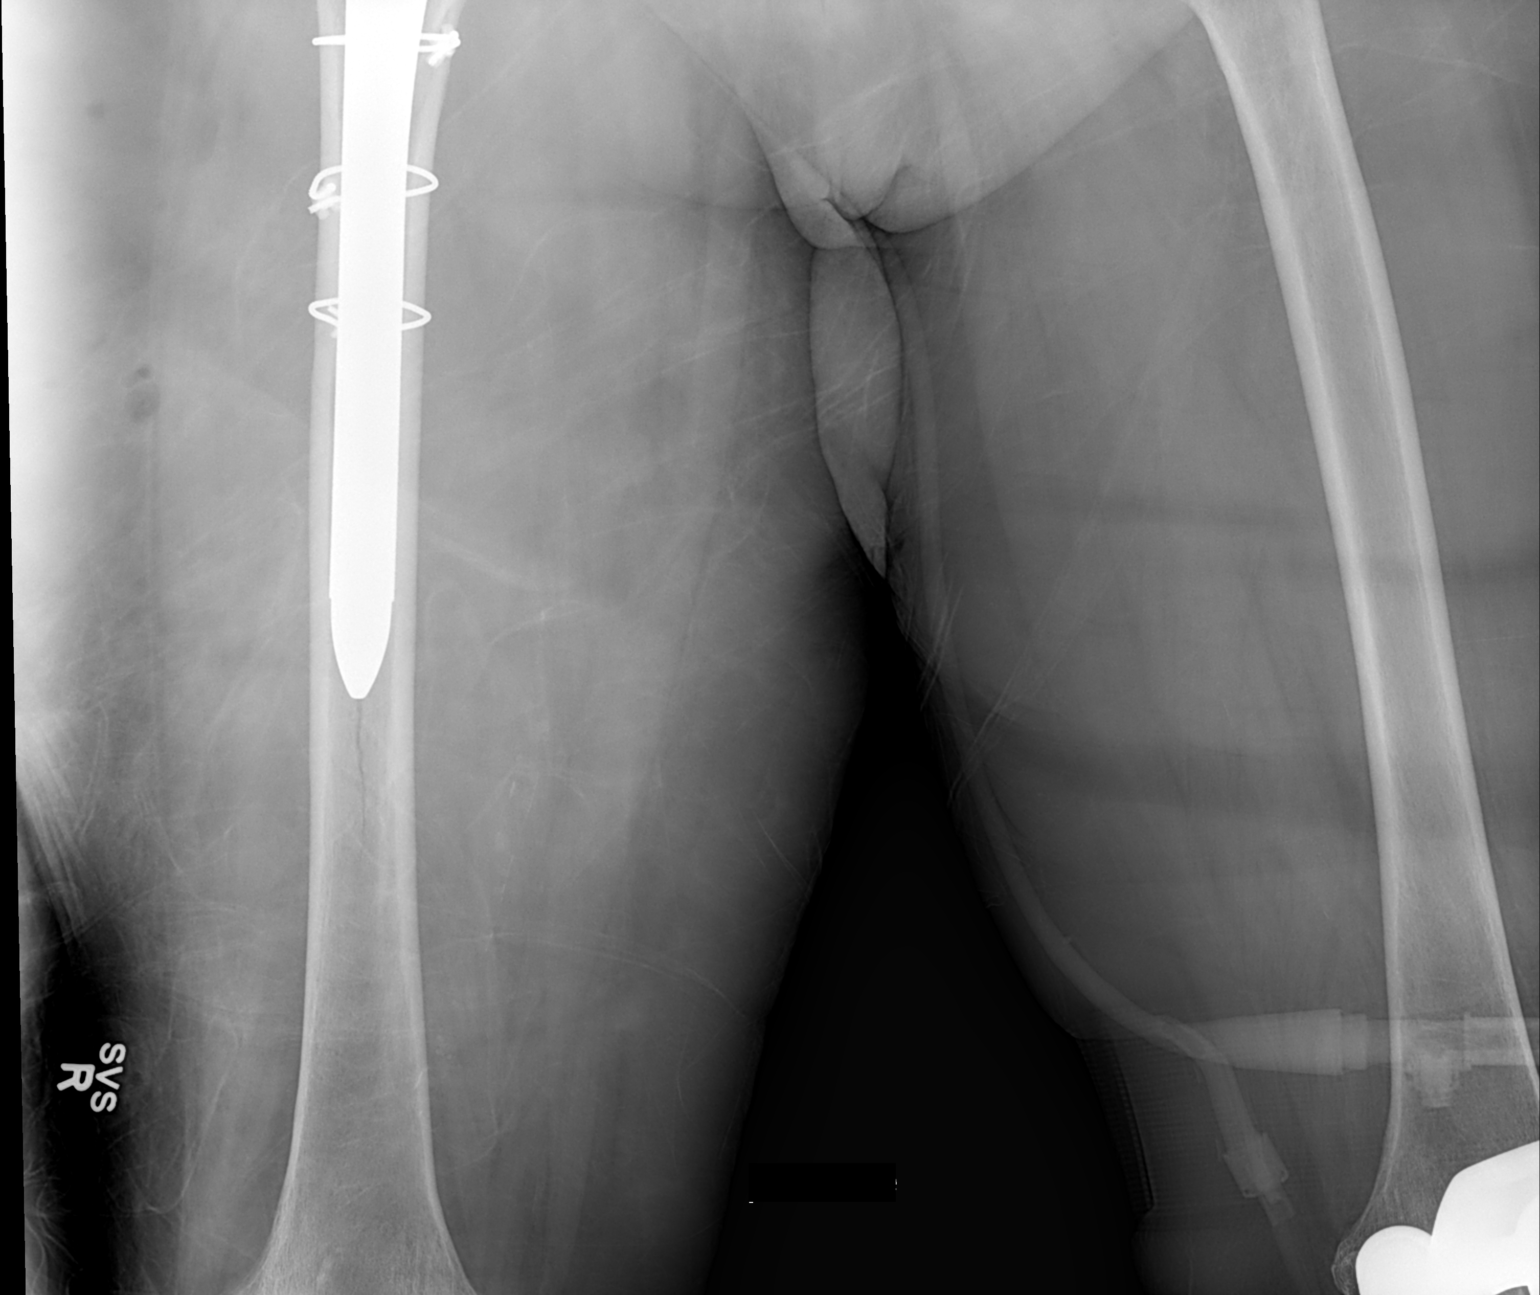

[2 of 2 positions shown; findings below may reference images not displayed]

FINDINGS: 2 portable AP views of the lower pelvis and right femur. Sequelae of
right total hip arthroplasty. Right femoral implant has been revised
since 02/05/2015. Longer right femoral stem is in place with re-
identified periprosthetic longitudinal fracture tracking into the
distal right femoral shaft (image 2).

Stable visualized bony pelvis. Visible left femur remarkable for
partially visible left knee arthroplasty.
IMPRESSION: Right femoral shaft periprosthetic fracture Re-identified.

## 2017-04-02 DIAGNOSIS — B964 Proteus (mirabilis) (morganii) as the cause of diseases classified elsewhere: Secondary | ICD-10-CM | POA: Diagnosis not present

## 2017-04-02 DIAGNOSIS — N390 Urinary tract infection, site not specified: Secondary | ICD-10-CM | POA: Diagnosis not present

## 2017-05-20 DIAGNOSIS — R351 Nocturia: Secondary | ICD-10-CM | POA: Diagnosis not present

## 2017-05-20 DIAGNOSIS — N3944 Nocturnal enuresis: Secondary | ICD-10-CM | POA: Diagnosis not present

## 2017-05-23 DIAGNOSIS — Z23 Encounter for immunization: Secondary | ICD-10-CM | POA: Diagnosis not present

## 2017-06-27 ENCOUNTER — Other Ambulatory Visit: Payer: Self-pay

## 2017-06-27 ENCOUNTER — Emergency Department (HOSPITAL_COMMUNITY): Payer: Medicare Other

## 2017-06-27 ENCOUNTER — Encounter (HOSPITAL_COMMUNITY): Payer: Self-pay | Admitting: Emergency Medicine

## 2017-06-27 ENCOUNTER — Emergency Department (HOSPITAL_COMMUNITY)
Admission: EM | Admit: 2017-06-27 | Discharge: 2017-06-27 | Disposition: A | Discharge status: Home / Self Care | Payer: Medicare Other | Attending: Emergency Medicine | Admitting: Emergency Medicine

## 2017-06-27 DIAGNOSIS — I10 Essential (primary) hypertension: Secondary | ICD-10-CM | POA: Insufficient documentation

## 2017-06-27 DIAGNOSIS — I4891 Unspecified atrial fibrillation: Secondary | ICD-10-CM | POA: Diagnosis not present

## 2017-06-27 DIAGNOSIS — Z79899 Other long term (current) drug therapy: Secondary | ICD-10-CM | POA: Insufficient documentation

## 2017-06-27 DIAGNOSIS — Y999 Unspecified external cause status: Secondary | ICD-10-CM | POA: Diagnosis not present

## 2017-06-27 DIAGNOSIS — R51 Headache: Secondary | ICD-10-CM | POA: Diagnosis not present

## 2017-06-27 DIAGNOSIS — M542 Cervicalgia: Secondary | ICD-10-CM | POA: Insufficient documentation

## 2017-06-27 DIAGNOSIS — R079 Chest pain, unspecified: Secondary | ICD-10-CM | POA: Diagnosis not present

## 2017-06-27 DIAGNOSIS — Z7901 Long term (current) use of anticoagulants: Secondary | ICD-10-CM | POA: Insufficient documentation

## 2017-06-27 DIAGNOSIS — T148XXA Other injury of unspecified body region, initial encounter: Secondary | ICD-10-CM | POA: Diagnosis not present

## 2017-06-27 DIAGNOSIS — S0990XA Unspecified injury of head, initial encounter: Secondary | ICD-10-CM | POA: Insufficient documentation

## 2017-06-27 DIAGNOSIS — M5489 Other dorsalgia: Secondary | ICD-10-CM | POA: Diagnosis not present

## 2017-06-27 DIAGNOSIS — Y92198 Other place in other specified residential institution as the place of occurrence of the external cause: Secondary | ICD-10-CM | POA: Diagnosis not present

## 2017-06-27 DIAGNOSIS — Y939 Activity, unspecified: Secondary | ICD-10-CM | POA: Diagnosis not present

## 2017-06-27 DIAGNOSIS — W0110XA Fall on same level from slipping, tripping and stumbling with subsequent striking against unspecified object, initial encounter: Secondary | ICD-10-CM | POA: Insufficient documentation

## 2017-06-27 DIAGNOSIS — W19XXXA Unspecified fall, initial encounter: Secondary | ICD-10-CM

## 2017-06-27 DIAGNOSIS — S199XXA Unspecified injury of neck, initial encounter: Secondary | ICD-10-CM | POA: Diagnosis not present

## 2017-06-27 DIAGNOSIS — E039 Hypothyroidism, unspecified: Secondary | ICD-10-CM | POA: Insufficient documentation

## 2017-06-27 DIAGNOSIS — S299XXA Unspecified injury of thorax, initial encounter: Secondary | ICD-10-CM | POA: Diagnosis not present

## 2017-06-27 LAB — I-STAT CHEM 8, ED
BUN: 33 mg/dL — ABNORMAL HIGH (ref 6–20)
Calcium, Ion: 1.16 mmol/L (ref 1.15–1.40)
Chloride: 104 mmol/L (ref 101–111)
Creatinine, Ser: 1.4 mg/dL — ABNORMAL HIGH (ref 0.44–1.00)
Glucose, Bld: 99 mg/dL (ref 65–99)
HEMATOCRIT: 43 % (ref 36.0–46.0)
HEMOGLOBIN: 14.6 g/dL (ref 12.0–15.0)
POTASSIUM: 4 mmol/L (ref 3.5–5.1)
Sodium: 141 mmol/L (ref 135–145)
TCO2: 29 mmol/L (ref 22–32)

## 2017-06-27 NOTE — ED Provider Notes (Signed)
[facility] EMERGENCY DEPARTMENT Provider Note CSN: [id number] Arrival date & time: 06/27/17  1555 History   Chief Complaint No chief complaint on file.  HPI Makayla Thomas is a 83 y.o. female with PMH afib on xarelto, who presents after a fall from standing after her knee gave out.  Patient states it happened couple of hours ago and her independent living facility and was advised to come to the emergency department for further evaluation.  She complains of pain in her head and neck.  As well as her upper back area.  She has no midline tenderness along spine.  States that she did not hit her head however did not lose consciousness.  She is currently on Xarelto.  Pain is not exacerbated by anything.  She has not tried anything to relieve her pain.  Her pain does not radiate.  It has been constant.  HPI  Past Medical History:  Diagnosis Date  . Anemia   . Aortic stenosis, moderate (was mild) 08/2009; 08/2014   a. ECHO  EF 55-60%, wth increased EDP, mild AS;; b. mild Conc LVH, EF 60-65%, DD with high LVEDP, MIld-Mod AS, Mod MAC with Mod MR, mild-mod PA HTN.  [name] Arthritis   . Borderline diabetes   . Chronic anticoagulation    on Xarelto  . Complication of anesthesia    hallusinations  . Constipation   . Diabetes mellitus without complication ([place])    possibly  . Dyslipidemia    treated  . Dysrhythmia    A-FIB  . H/O cardiovascular stress test 05/2010   Lexiscan cardiolite no ischemia or infarction  . H/O multiple sclerosis    no excaerbations in some time  . History of ETT 02/2011   [place] exercise protocol, negative with poor exercise tolerance  . History of transfusion   . Hypertension   . Hypothyroidism   . Lower extremity edema    chronic  . PAD (peripheral artery disease) ([place]) 08/30/2009   Dopplers; with bil. post. tib artery occlusion  . Permanent atrial fibrillation ([place])   . Transient ischemic attack (TIA)    NO RESIDUAL PROBLEMS  . Vaginal itching     . Visual disturbances    "FLASHING IN MY EYES"   Patient Active Problem List   Diagnosis Date Noted  . Periprosthetic fracture around internal prosthetic right hip joint ([place]) 02/05/2015  . Hypothyroidism 02/05/2015  . Obese 01/30/2015  . S/P right THA, AA 01/29/2015  . Rectal bleeding   . Anticoagulation adequate   . Stroke ([place])   . Dehydration 08/29/2014  . Nausea and vomiting 08/29/2014  . Diarrhea 08/29/2014  . Hypokalemia 08/29/2014  . Acute renal failure ([place]) 08/29/2014  . Leukocytosis 08/29/2014  . History of TIA (transient ischemic attack) 08/18/2014  . Essential hypertension -well controlled 01/10/2013  . Permanent atrial fibrillation ([place]); CHA2DS2-VASc Score 6   . Chronic anticoagulation   . Dyslipidemia, goal LDL below 100 - due aortic stenosis   . Moderate aortic stenosis 08/17/2009   Past Surgical History:  Procedure Laterality Date  . BREAST SURGERY     L tumor removed - benign  . CESAREAN SECTION     hx of 3  . CHOLECYSTECTOMY    . JOINT REPLACEMENT  2012   left total knee  . throidectomy partial     lt   OB History    No data available     Home Medications    Prior to Admission medications   Medication Sig  Start Date End Date Taking? Authorizing Provider  acetaminophen (TYLENOL) 325 MG tablet Take 1-2 tablets (325-650 mg total) by mouth every 6 (six) hours as needed for moderate pain. 02/01/15   Danae Orleans, PA-C  amoxicillin-clavulanate (AUGMENTIN) 500-125 MG tablet Take 1 tablet by mouth 2 (two) times daily. Started on 07-24-16 for 7 days    [provider]  bisoprolol (ZEBETA) 10 MG tablet Take 1 tablet (10 mg total) by mouth daily. BISOPROLOL 1/2 TABDAILY ,IF PALPITION INCREASE OK TO TAKE ANOTHER 1/2 TABLET. 02/23/17   Leonie Man, MD  CARTIA XT 120 MG 24 hr capsule TAKE ONE CAPSULE BY MOUTH AT BEDTIME 01/22/17   Leonie Man, MD  cholecalciferol (VITAMIN D) 1000 UNITS tablet Take 1,000 Units by mouth every morning.      [provider]  furosemide (LASIX) 20 MG tablet Take 10-20 mg by mouth every morning.     [provider]  hydrocortisone (ANUSOL-HC) 2.5 % rectal cream Apply rectally 2 times daily 07/28/16   Recardo Evangelist, PA-C  levothyroxine (SYNTHROID, LEVOTHROID) 125 MCG tablet Take 125 mcg by mouth daily before breakfast.    [provider]  Pitavastatin Calcium (LIVALO) 1 MG TABS Take 1 mg by mouth daily.    [provider]  Rivaroxaban (XARELTO) 15 MG TABS tablet Take 1 tablet (15 mg total) by mouth daily with breakfast. Patient taking differently: Take 15 mg by mouth daily with supper.  02/12/15   Hosie Poisson, MD  sertraline (ZOLOFT) 50 MG tablet Take 50 mg by mouth daily.    [provider]  triamterene-hydrochlorothiazide (MAXZIDE-25) 37.5-25 MG per tablet Take 0.5 tablets by mouth every morning.     [provider]   Family History Family History  Problem Relation Age of Onset  . Heart failure Father 48  . Lung cancer Father    Social History Social History   Tobacco Use  . Smoking status: Never Smoker  . Smokeless tobacco: Never Used  Substance Use Topics  . Alcohol use: No    Alcohol/week: 0.0 oz  . Drug use: No   Allergies   Tetanus antitoxin; Meclizine; and Tramadol  Review of Systems Review of Systems  Constitutional: Negative for chills and fever.  HENT: Negative for ear pain and sore throat.   Eyes: Negative for pain and visual disturbance.  Respiratory: Negative for cough and shortness of breath.   Cardiovascular: Negative for chest pain and palpitations.  Gastrointestinal: Negative for abdominal pain and vomiting.  Genitourinary: Negative for dysuria and hematuria.  Musculoskeletal: Positive for back pain and neck pain. Negative for arthralgias.  Skin: Negative for color change and rash.  Neurological: Positive for headaches. Negative for seizures and syncope.  All other systems reviewed and are  negative.  Physical Exam Updated Vital Signs BP (!) 148/68 (BP Location: Right Arm)   Pulse (!) 59   Temp 98.1 F (36.7 C) (Oral)   Resp 14   SpO2 99%   Physical Exam  Constitutional: She appears well-developed and well-nourished. No distress.  HENT:  Head: Normocephalic and atraumatic.  Eyes: Conjunctivae are normal.  Neck: Neck supple.  Cardiovascular: Normal rate and regular rhythm.  No murmur heard. Pulmonary/Chest: Effort normal and breath sounds normal. No respiratory distress.  Abdominal: Soft. There is no tenderness.  Musculoskeletal: Normal range of motion. She exhibits no edema or deformity.  Neurological: She is alert.  Skin: Skin is warm and dry.  Psychiatric: She has a normal mood and affect.  Nursing note and vitals reviewed.  ED Treatments / Results  Labs (all labs ordered are listed, but only abnormal results are displayed) Labs Reviewed  I-STAT CHEM 8, ED - Abnormal; Notable for the following components:      Result Value   BUN 33 (*)    Creatinine, Ser 1.40 (*)    All other components within normal limits   EKG  EKG Interpretation None      Radiology Ct Head Wo Contrast  Result Date: 06/27/2017 CLINICAL DATA:  Fall backwards today with head injury. Back pain. Headache. EXAM: CT HEAD WITHOUT CONTRAST CT CERVICAL SPINE WITHOUT CONTRAST TECHNIQUE: Multidetector CT imaging of the head and cervical spine was performed following the standard protocol without intravenous contrast. Multiplanar CT image reconstructions of the cervical spine were also generated. COMPARISON:  08/31/2014 head CT. 11/17/2009 head and cervical spine CT. FINDINGS: CT HEAD FINDINGS Brain: No evidence of parenchymal hemorrhage or extra-axial fluid collection. No mass lesion, mass effect, or midline shift. No CT evidence of acute infarction. Generalized cerebral volume loss. Nonspecific severe subcortical and periventricular white matter hypodensity, most in keeping with chronic small  vessel ischemic change. No ventriculomegaly. Vascular: No acute abnormality. Skull: No evidence of calvarial fracture. Sinuses/Orbits: The visualized paranasal sinuses are essentially clear. Other:  The mastoid air cells are unopacified. CT CERVICAL SPINE FINDINGS Alignment: Normal cervical lordosis. No acute subluxation. Minimal 2 mm retrolisthesis at C4-5 is stable since 11/17/2009 CT. Dens is well positioned between the lateral masses of C1. Skull base and vertebrae: No acute fracture. No primary bone lesion or focal pathologic process. Soft tissues and spinal canal: No prevertebral fluid or swelling. No visible canal hematoma. Disc levels: Moderate to severe multilevel degenerative disc disease throughout the cervical spine, not appreciably changed. Moderate bilateral facet arthropathy. Mild bilateral degenerative foraminal stenosis at C3-4 and C5-6. Upper chest: No acute abnormality.  Aortic atherosclerosis. Other: Visualized mastoid air cells appear clear. No discrete thyroid nodules. No pathologically enlarged cervical nodes. IMPRESSION: 1. No evidence of acute intracranial abnormality. No evidence of calvarial fracture. 2. Generalized cerebral volume loss and marked chronic small vessel ischemic change in the cerebral white matter. 3. No cervical spine fracture or acute malalignment. 4. Moderate to severe degenerative changes in the cervical spine as detailed. Electronically Signed   By: Ilona Sorrel M.D.   On: 06/27/2017 17:34   Ct Cervical Spine Wo Contrast  Result Date: 06/27/2017 CLINICAL DATA:  Fall backwards today with head injury. Back pain. Headache. EXAM: CT HEAD WITHOUT CONTRAST CT CERVICAL SPINE WITHOUT CONTRAST TECHNIQUE: Multidetector CT imaging of the head and cervical spine was performed following the standard protocol without intravenous contrast. Multiplanar CT image reconstructions of the cervical spine were also generated. COMPARISON:  08/31/2014 head CT. 11/17/2009 head and cervical  spine CT. FINDINGS: CT HEAD FINDINGS Brain: No evidence of parenchymal hemorrhage or extra-axial fluid collection. No mass lesion, mass effect, or midline shift. No CT evidence of acute infarction. Generalized cerebral volume loss. Nonspecific severe subcortical and periventricular white matter hypodensity, most in keeping with chronic small vessel ischemic change. No ventriculomegaly. Vascular: No acute abnormality. Skull: No evidence of calvarial fracture. Sinuses/Orbits: The visualized paranasal sinuses are essentially clear. Other:  The mastoid air cells are unopacified. CT CERVICAL SPINE FINDINGS Alignment: Normal cervical lordosis. No acute subluxation. Minimal 2 mm retrolisthesis at C4-5 is stable since 11/17/2009 CT. Dens is well positioned between the lateral masses of C1. Skull base and vertebrae: No acute fracture. No primary bone lesion  or focal pathologic process. Soft tissues and spinal canal: No prevertebral fluid or swelling. No visible canal hematoma. Disc levels: Moderate to severe multilevel degenerative disc disease throughout the cervical spine, not appreciably changed. Moderate bilateral facet arthropathy. Mild bilateral degenerative foraminal stenosis at C3-4 and C5-6. Upper chest: No acute abnormality.  Aortic atherosclerosis. Other: Visualized mastoid air cells appear clear. No discrete thyroid nodules. No pathologically enlarged cervical nodes. IMPRESSION: 1. No evidence of acute intracranial abnormality. No evidence of calvarial fracture. 2. Generalized cerebral volume loss and marked chronic small vessel ischemic change in the cerebral white matter. 3. No cervical spine fracture or acute malalignment. 4. Moderate to severe degenerative changes in the cervical spine as detailed. Electronically Signed   By: Ilona Sorrel M.D.   On: 06/27/2017 17:34   Dg Chest Portable 1 View  Result Date: 06/27/2017 CLINICAL DATA:  Pain after fall. EXAM: PORTABLE CHEST 1 VIEW COMPARISON:  02/08/2015  FINDINGS: Lungs are adequately inflated without focal consolidation or effusion. Mild-to-moderate stable cardiomegaly. Calcified plaque is present over the thoracic aorta. There is diffuse osteopenia. There are degenerative changes of the spine and shoulders. No definite acute fracture. Surgical clips over the neck base. IMPRESSION: No acute findings. Mild to moderate cardiomegaly. Electronically Signed   By: Marin Olp M.D.   On: 06/27/2017 16:57    Procedures Procedures (including critical care time)  Medications Ordered in ED Medications - No data to display  Initial Impression / Assessment and Plan / ED Course  I have reviewed the triage vital signs and the nursing notes.  Pertinent labs & imaging results that were available during my care of the patient were reviewed by me and considered in my medical decision making (see chart for details).  Pt is a 83 y.o. female with pertinent PMHX he is hypertension, hyperlipidemia, and aortic stenosis who presents after mechanical fall from standing.  She denies any loss of consciousness.  Denies that she had any associated chest pain, nausea, dizziness, weakness, or shortness of breath prior to the fall.  My initial assessment the patient appears comfortable no and complains of pain in her head and neck.  She is hemodynamically stable.  Alert and oriented x3.  Does not appear to be in any acute distress at this time.  The patient is elderly and has multiple comorbidities.  The patient does take anticoagulants:  Xarelto Will obtain CT of head and neck. :  CT revealed no acute findings or fractures. Chest x-ray with no acute findings. Basic labs performed.   Labs and imaging reviewed by myself and considered in medical decision making if ordered.  Imaging interpreted by radiology.  Patient discharged in stable condition.  Given strict return precautions  The plan for this patient was discussed with Dr. Tomi Bamberger, who voiced agreement and who  oversaw evaluation and treatment of this patient.   Final Clinical Impressions(s) / ED Diagnoses   Final diagnoses:  Fall, initial encounter    ED Discharge Orders    None       Fenton Foy, MD 06/29/17 1594    Dorie Rank, MD 06/30/17 1315

## 2017-06-27 NOTE — ED Triage Notes (Signed)
Pt in via [facility] from [facility] after tripping and falling today. C/o back and bilateral shoulder pain. Pt did hit head, denies LOC. Does take Xarelto for afib.

## 2017-06-27 NOTE — ED Notes (Signed)
Placed pillow under pt. Knees per pt request

## 2017-06-27 NOTE — ED Notes (Signed)
Pt calling son at this time to come and get her.

## 2017-06-28 NOTE — ED Provider Notes (Signed)
Pt presented to the ED after a mechanical fall sustaining a minor head injury..  No significant injuries noted on exam.  High risk because of her use of xarelto.  CT scans performed and were reassuring.   Pt discharged in good condition.   I saw and evaluated the patient, reviewed the resident's note and I agree with the findings and plan.     Linwood Dibbles, MD 06/28/17 [phone]

## 2017-07-05 DIAGNOSIS — R2689 Other abnormalities of gait and mobility: Secondary | ICD-10-CM | POA: Diagnosis not present

## 2017-07-05 DIAGNOSIS — R2681 Unsteadiness on feet: Secondary | ICD-10-CM | POA: Diagnosis not present

## 2017-07-05 DIAGNOSIS — N393 Stress incontinence (female) (male): Secondary | ICD-10-CM | POA: Diagnosis not present

## 2017-07-05 DIAGNOSIS — M546 Pain in thoracic spine: Secondary | ICD-10-CM | POA: Diagnosis not present

## 2017-07-05 DIAGNOSIS — M25512 Pain in left shoulder: Secondary | ICD-10-CM | POA: Diagnosis not present

## 2017-07-05 DIAGNOSIS — M6281 Muscle weakness (generalized): Secondary | ICD-10-CM | POA: Diagnosis not present

## 2017-07-06 DIAGNOSIS — Z0000 Encounter for general adult medical examination without abnormal findings: Secondary | ICD-10-CM | POA: Diagnosis not present

## 2017-07-06 DIAGNOSIS — E039 Hypothyroidism, unspecified: Secondary | ICD-10-CM | POA: Diagnosis not present

## 2017-07-06 DIAGNOSIS — E785 Hyperlipidemia, unspecified: Secondary | ICD-10-CM | POA: Diagnosis not present

## 2017-07-06 DIAGNOSIS — I10 Essential (primary) hypertension: Secondary | ICD-10-CM | POA: Diagnosis not present

## 2017-07-06 DIAGNOSIS — M810 Age-related osteoporosis without current pathological fracture: Secondary | ICD-10-CM | POA: Diagnosis not present

## 2017-07-06 DIAGNOSIS — N390 Urinary tract infection, site not specified: Secondary | ICD-10-CM | POA: Diagnosis not present

## 2017-07-07 DIAGNOSIS — N393 Stress incontinence (female) (male): Secondary | ICD-10-CM | POA: Diagnosis not present

## 2017-07-07 DIAGNOSIS — M6281 Muscle weakness (generalized): Secondary | ICD-10-CM | POA: Diagnosis not present

## 2017-07-07 DIAGNOSIS — R2681 Unsteadiness on feet: Secondary | ICD-10-CM | POA: Diagnosis not present

## 2017-07-07 DIAGNOSIS — R2689 Other abnormalities of gait and mobility: Secondary | ICD-10-CM | POA: Diagnosis not present

## 2017-07-07 DIAGNOSIS — M25512 Pain in left shoulder: Secondary | ICD-10-CM | POA: Diagnosis not present

## 2017-07-07 DIAGNOSIS — M546 Pain in thoracic spine: Secondary | ICD-10-CM | POA: Diagnosis not present

## 2017-07-09 DIAGNOSIS — M546 Pain in thoracic spine: Secondary | ICD-10-CM | POA: Diagnosis not present

## 2017-07-09 DIAGNOSIS — M6281 Muscle weakness (generalized): Secondary | ICD-10-CM | POA: Diagnosis not present

## 2017-07-09 DIAGNOSIS — R2681 Unsteadiness on feet: Secondary | ICD-10-CM | POA: Diagnosis not present

## 2017-07-09 DIAGNOSIS — M25512 Pain in left shoulder: Secondary | ICD-10-CM | POA: Diagnosis not present

## 2017-07-09 DIAGNOSIS — N393 Stress incontinence (female) (male): Secondary | ICD-10-CM | POA: Diagnosis not present

## 2017-07-09 DIAGNOSIS — R2689 Other abnormalities of gait and mobility: Secondary | ICD-10-CM | POA: Diagnosis not present

## 2017-07-12 DIAGNOSIS — R2689 Other abnormalities of gait and mobility: Secondary | ICD-10-CM | POA: Diagnosis not present

## 2017-07-12 DIAGNOSIS — M546 Pain in thoracic spine: Secondary | ICD-10-CM | POA: Diagnosis not present

## 2017-07-12 DIAGNOSIS — M25512 Pain in left shoulder: Secondary | ICD-10-CM | POA: Diagnosis not present

## 2017-07-12 DIAGNOSIS — N393 Stress incontinence (female) (male): Secondary | ICD-10-CM | POA: Diagnosis not present

## 2017-07-12 DIAGNOSIS — R2681 Unsteadiness on feet: Secondary | ICD-10-CM | POA: Diagnosis not present

## 2017-07-12 DIAGNOSIS — M6281 Muscle weakness (generalized): Secondary | ICD-10-CM | POA: Diagnosis not present

## 2017-07-13 DIAGNOSIS — R2689 Other abnormalities of gait and mobility: Secondary | ICD-10-CM | POA: Diagnosis not present

## 2017-07-13 DIAGNOSIS — M6281 Muscle weakness (generalized): Secondary | ICD-10-CM | POA: Diagnosis not present

## 2017-07-13 DIAGNOSIS — M546 Pain in thoracic spine: Secondary | ICD-10-CM | POA: Diagnosis not present

## 2017-07-13 DIAGNOSIS — M25512 Pain in left shoulder: Secondary | ICD-10-CM | POA: Diagnosis not present

## 2017-07-13 DIAGNOSIS — R2681 Unsteadiness on feet: Secondary | ICD-10-CM | POA: Diagnosis not present

## 2017-07-13 DIAGNOSIS — N393 Stress incontinence (female) (male): Secondary | ICD-10-CM | POA: Diagnosis not present

## 2017-07-14 DIAGNOSIS — R2689 Other abnormalities of gait and mobility: Secondary | ICD-10-CM | POA: Diagnosis not present

## 2017-07-14 DIAGNOSIS — M6281 Muscle weakness (generalized): Secondary | ICD-10-CM | POA: Diagnosis not present

## 2017-07-14 DIAGNOSIS — N393 Stress incontinence (female) (male): Secondary | ICD-10-CM | POA: Diagnosis not present

## 2017-07-14 DIAGNOSIS — M25512 Pain in left shoulder: Secondary | ICD-10-CM | POA: Diagnosis not present

## 2017-07-14 DIAGNOSIS — M546 Pain in thoracic spine: Secondary | ICD-10-CM | POA: Diagnosis not present

## 2017-07-14 DIAGNOSIS — R2681 Unsteadiness on feet: Secondary | ICD-10-CM | POA: Diagnosis not present

## 2017-07-15 DIAGNOSIS — M6281 Muscle weakness (generalized): Secondary | ICD-10-CM | POA: Diagnosis not present

## 2017-07-15 DIAGNOSIS — R2681 Unsteadiness on feet: Secondary | ICD-10-CM | POA: Diagnosis not present

## 2017-07-15 DIAGNOSIS — R2689 Other abnormalities of gait and mobility: Secondary | ICD-10-CM | POA: Diagnosis not present

## 2017-07-15 DIAGNOSIS — N393 Stress incontinence (female) (male): Secondary | ICD-10-CM | POA: Diagnosis not present

## 2017-07-15 DIAGNOSIS — M546 Pain in thoracic spine: Secondary | ICD-10-CM | POA: Diagnosis not present

## 2017-07-15 DIAGNOSIS — M25512 Pain in left shoulder: Secondary | ICD-10-CM | POA: Diagnosis not present

## 2017-07-16 DIAGNOSIS — R2681 Unsteadiness on feet: Secondary | ICD-10-CM | POA: Diagnosis not present

## 2017-07-16 DIAGNOSIS — N393 Stress incontinence (female) (male): Secondary | ICD-10-CM | POA: Diagnosis not present

## 2017-07-16 DIAGNOSIS — M6281 Muscle weakness (generalized): Secondary | ICD-10-CM | POA: Diagnosis not present

## 2017-07-16 DIAGNOSIS — M25512 Pain in left shoulder: Secondary | ICD-10-CM | POA: Diagnosis not present

## 2017-07-16 DIAGNOSIS — M546 Pain in thoracic spine: Secondary | ICD-10-CM | POA: Diagnosis not present

## 2017-07-16 DIAGNOSIS — R2689 Other abnormalities of gait and mobility: Secondary | ICD-10-CM | POA: Diagnosis not present

## 2017-07-19 DIAGNOSIS — Z7902 Long term (current) use of antithrombotics/antiplatelets: Secondary | ICD-10-CM | POA: Diagnosis not present

## 2017-07-19 DIAGNOSIS — Z6832 Body mass index (BMI) 32.0-32.9, adult: Secondary | ICD-10-CM | POA: Diagnosis not present

## 2017-07-19 DIAGNOSIS — M6281 Muscle weakness (generalized): Secondary | ICD-10-CM | POA: Diagnosis not present

## 2017-07-19 DIAGNOSIS — G35 Multiple sclerosis: Secondary | ICD-10-CM | POA: Diagnosis not present

## 2017-07-19 DIAGNOSIS — M810 Age-related osteoporosis without current pathological fracture: Secondary | ICD-10-CM | POA: Diagnosis not present

## 2017-07-19 DIAGNOSIS — I739 Peripheral vascular disease, unspecified: Secondary | ICD-10-CM | POA: Diagnosis not present

## 2017-07-19 DIAGNOSIS — E039 Hypothyroidism, unspecified: Secondary | ICD-10-CM | POA: Diagnosis not present

## 2017-07-19 DIAGNOSIS — R600 Localized edema: Secondary | ICD-10-CM | POA: Diagnosis not present

## 2017-07-19 DIAGNOSIS — M546 Pain in thoracic spine: Secondary | ICD-10-CM | POA: Diagnosis not present

## 2017-07-19 DIAGNOSIS — I482 Chronic atrial fibrillation: Secondary | ICD-10-CM | POA: Diagnosis not present

## 2017-07-19 DIAGNOSIS — N393 Stress incontinence (female) (male): Secondary | ICD-10-CM | POA: Diagnosis not present

## 2017-07-19 DIAGNOSIS — I10 Essential (primary) hypertension: Secondary | ICD-10-CM | POA: Diagnosis not present

## 2017-07-19 DIAGNOSIS — M25512 Pain in left shoulder: Secondary | ICD-10-CM | POA: Diagnosis not present

## 2017-07-19 DIAGNOSIS — E785 Hyperlipidemia, unspecified: Secondary | ICD-10-CM | POA: Diagnosis not present

## 2017-07-19 DIAGNOSIS — R2689 Other abnormalities of gait and mobility: Secondary | ICD-10-CM | POA: Diagnosis not present

## 2017-07-19 DIAGNOSIS — E1129 Type 2 diabetes mellitus with other diabetic kidney complication: Secondary | ICD-10-CM | POA: Diagnosis not present

## 2017-07-19 DIAGNOSIS — R2681 Unsteadiness on feet: Secondary | ICD-10-CM | POA: Diagnosis not present

## 2017-07-19 DIAGNOSIS — R32 Unspecified urinary incontinence: Secondary | ICD-10-CM | POA: Diagnosis not present

## 2017-07-20 DIAGNOSIS — R2689 Other abnormalities of gait and mobility: Secondary | ICD-10-CM | POA: Diagnosis not present

## 2017-07-20 DIAGNOSIS — M6281 Muscle weakness (generalized): Secondary | ICD-10-CM | POA: Diagnosis not present

## 2017-07-20 DIAGNOSIS — M25512 Pain in left shoulder: Secondary | ICD-10-CM | POA: Diagnosis not present

## 2017-07-20 DIAGNOSIS — M546 Pain in thoracic spine: Secondary | ICD-10-CM | POA: Diagnosis not present

## 2017-07-20 DIAGNOSIS — R2681 Unsteadiness on feet: Secondary | ICD-10-CM | POA: Diagnosis not present

## 2017-07-20 DIAGNOSIS — N393 Stress incontinence (female) (male): Secondary | ICD-10-CM | POA: Diagnosis not present

## 2017-07-21 DIAGNOSIS — R2689 Other abnormalities of gait and mobility: Secondary | ICD-10-CM | POA: Diagnosis not present

## 2017-07-21 DIAGNOSIS — M6281 Muscle weakness (generalized): Secondary | ICD-10-CM | POA: Diagnosis not present

## 2017-07-21 DIAGNOSIS — M25512 Pain in left shoulder: Secondary | ICD-10-CM | POA: Diagnosis not present

## 2017-07-21 DIAGNOSIS — N393 Stress incontinence (female) (male): Secondary | ICD-10-CM | POA: Diagnosis not present

## 2017-07-21 DIAGNOSIS — R2681 Unsteadiness on feet: Secondary | ICD-10-CM | POA: Diagnosis not present

## 2017-07-21 DIAGNOSIS — M546 Pain in thoracic spine: Secondary | ICD-10-CM | POA: Diagnosis not present

## 2017-07-22 DIAGNOSIS — M546 Pain in thoracic spine: Secondary | ICD-10-CM | POA: Diagnosis not present

## 2017-07-22 DIAGNOSIS — M6281 Muscle weakness (generalized): Secondary | ICD-10-CM | POA: Diagnosis not present

## 2017-07-22 DIAGNOSIS — R2689 Other abnormalities of gait and mobility: Secondary | ICD-10-CM | POA: Diagnosis not present

## 2017-07-22 DIAGNOSIS — M25512 Pain in left shoulder: Secondary | ICD-10-CM | POA: Diagnosis not present

## 2017-07-22 DIAGNOSIS — N393 Stress incontinence (female) (male): Secondary | ICD-10-CM | POA: Diagnosis not present

## 2017-07-22 DIAGNOSIS — R2681 Unsteadiness on feet: Secondary | ICD-10-CM | POA: Diagnosis not present

## 2017-07-23 DIAGNOSIS — R2681 Unsteadiness on feet: Secondary | ICD-10-CM | POA: Diagnosis not present

## 2017-07-23 DIAGNOSIS — M25512 Pain in left shoulder: Secondary | ICD-10-CM | POA: Diagnosis not present

## 2017-07-23 DIAGNOSIS — N393 Stress incontinence (female) (male): Secondary | ICD-10-CM | POA: Diagnosis not present

## 2017-07-23 DIAGNOSIS — R2689 Other abnormalities of gait and mobility: Secondary | ICD-10-CM | POA: Diagnosis not present

## 2017-07-23 DIAGNOSIS — M546 Pain in thoracic spine: Secondary | ICD-10-CM | POA: Diagnosis not present

## 2017-07-23 DIAGNOSIS — M6281 Muscle weakness (generalized): Secondary | ICD-10-CM | POA: Diagnosis not present

## 2017-07-26 DIAGNOSIS — M546 Pain in thoracic spine: Secondary | ICD-10-CM | POA: Diagnosis not present

## 2017-07-26 DIAGNOSIS — R2689 Other abnormalities of gait and mobility: Secondary | ICD-10-CM | POA: Diagnosis not present

## 2017-07-26 DIAGNOSIS — M6281 Muscle weakness (generalized): Secondary | ICD-10-CM | POA: Diagnosis not present

## 2017-07-26 DIAGNOSIS — M25512 Pain in left shoulder: Secondary | ICD-10-CM | POA: Diagnosis not present

## 2017-07-26 DIAGNOSIS — N393 Stress incontinence (female) (male): Secondary | ICD-10-CM | POA: Diagnosis not present

## 2017-07-26 DIAGNOSIS — R2681 Unsteadiness on feet: Secondary | ICD-10-CM | POA: Diagnosis not present

## 2017-07-27 DIAGNOSIS — M546 Pain in thoracic spine: Secondary | ICD-10-CM | POA: Diagnosis not present

## 2017-07-27 DIAGNOSIS — R2689 Other abnormalities of gait and mobility: Secondary | ICD-10-CM | POA: Diagnosis not present

## 2017-07-27 DIAGNOSIS — R2681 Unsteadiness on feet: Secondary | ICD-10-CM | POA: Diagnosis not present

## 2017-07-27 DIAGNOSIS — M6281 Muscle weakness (generalized): Secondary | ICD-10-CM | POA: Diagnosis not present

## 2017-07-27 DIAGNOSIS — M25512 Pain in left shoulder: Secondary | ICD-10-CM | POA: Diagnosis not present

## 2017-07-27 DIAGNOSIS — N393 Stress incontinence (female) (male): Secondary | ICD-10-CM | POA: Diagnosis not present

## 2017-07-28 DIAGNOSIS — M6281 Muscle weakness (generalized): Secondary | ICD-10-CM | POA: Diagnosis not present

## 2017-07-28 DIAGNOSIS — M25512 Pain in left shoulder: Secondary | ICD-10-CM | POA: Diagnosis not present

## 2017-07-28 DIAGNOSIS — R2681 Unsteadiness on feet: Secondary | ICD-10-CM | POA: Diagnosis not present

## 2017-07-28 DIAGNOSIS — R2689 Other abnormalities of gait and mobility: Secondary | ICD-10-CM | POA: Diagnosis not present

## 2017-07-28 DIAGNOSIS — M546 Pain in thoracic spine: Secondary | ICD-10-CM | POA: Diagnosis not present

## 2017-07-28 DIAGNOSIS — N393 Stress incontinence (female) (male): Secondary | ICD-10-CM | POA: Diagnosis not present

## 2017-07-29 DIAGNOSIS — M546 Pain in thoracic spine: Secondary | ICD-10-CM | POA: Diagnosis not present

## 2017-07-29 DIAGNOSIS — R2689 Other abnormalities of gait and mobility: Secondary | ICD-10-CM | POA: Diagnosis not present

## 2017-07-29 DIAGNOSIS — N393 Stress incontinence (female) (male): Secondary | ICD-10-CM | POA: Diagnosis not present

## 2017-07-29 DIAGNOSIS — M6281 Muscle weakness (generalized): Secondary | ICD-10-CM | POA: Diagnosis not present

## 2017-07-29 DIAGNOSIS — M25512 Pain in left shoulder: Secondary | ICD-10-CM | POA: Diagnosis not present

## 2017-07-29 DIAGNOSIS — R2681 Unsteadiness on feet: Secondary | ICD-10-CM | POA: Diagnosis not present

## 2017-08-03 DIAGNOSIS — R2689 Other abnormalities of gait and mobility: Secondary | ICD-10-CM | POA: Diagnosis not present

## 2017-08-03 DIAGNOSIS — R2681 Unsteadiness on feet: Secondary | ICD-10-CM | POA: Diagnosis not present

## 2017-08-03 DIAGNOSIS — M25512 Pain in left shoulder: Secondary | ICD-10-CM | POA: Diagnosis not present

## 2017-08-03 DIAGNOSIS — M6281 Muscle weakness (generalized): Secondary | ICD-10-CM | POA: Diagnosis not present

## 2017-08-03 DIAGNOSIS — N393 Stress incontinence (female) (male): Secondary | ICD-10-CM | POA: Diagnosis not present

## 2017-08-03 DIAGNOSIS — M546 Pain in thoracic spine: Secondary | ICD-10-CM | POA: Diagnosis not present

## 2017-08-05 DIAGNOSIS — R2681 Unsteadiness on feet: Secondary | ICD-10-CM | POA: Diagnosis not present

## 2017-08-05 DIAGNOSIS — N393 Stress incontinence (female) (male): Secondary | ICD-10-CM | POA: Diagnosis not present

## 2017-08-05 DIAGNOSIS — M546 Pain in thoracic spine: Secondary | ICD-10-CM | POA: Diagnosis not present

## 2017-08-05 DIAGNOSIS — M6281 Muscle weakness (generalized): Secondary | ICD-10-CM | POA: Diagnosis not present

## 2017-08-05 DIAGNOSIS — M25512 Pain in left shoulder: Secondary | ICD-10-CM | POA: Diagnosis not present

## 2017-08-05 DIAGNOSIS — R2689 Other abnormalities of gait and mobility: Secondary | ICD-10-CM | POA: Diagnosis not present

## 2017-08-06 DIAGNOSIS — M6281 Muscle weakness (generalized): Secondary | ICD-10-CM | POA: Diagnosis not present

## 2017-08-06 DIAGNOSIS — R2689 Other abnormalities of gait and mobility: Secondary | ICD-10-CM | POA: Diagnosis not present

## 2017-08-06 DIAGNOSIS — N393 Stress incontinence (female) (male): Secondary | ICD-10-CM | POA: Diagnosis not present

## 2017-08-06 DIAGNOSIS — M546 Pain in thoracic spine: Secondary | ICD-10-CM | POA: Diagnosis not present

## 2017-08-06 DIAGNOSIS — R2681 Unsteadiness on feet: Secondary | ICD-10-CM | POA: Diagnosis not present

## 2017-08-06 DIAGNOSIS — M25512 Pain in left shoulder: Secondary | ICD-10-CM | POA: Diagnosis not present

## 2017-08-18 DIAGNOSIS — R2689 Other abnormalities of gait and mobility: Secondary | ICD-10-CM | POA: Diagnosis not present

## 2017-08-18 DIAGNOSIS — M6281 Muscle weakness (generalized): Secondary | ICD-10-CM | POA: Diagnosis not present

## 2017-08-18 DIAGNOSIS — M25512 Pain in left shoulder: Secondary | ICD-10-CM | POA: Diagnosis not present

## 2017-08-18 DIAGNOSIS — N393 Stress incontinence (female) (male): Secondary | ICD-10-CM | POA: Diagnosis not present

## 2017-08-18 DIAGNOSIS — R2681 Unsteadiness on feet: Secondary | ICD-10-CM | POA: Diagnosis not present

## 2017-08-18 DIAGNOSIS — M546 Pain in thoracic spine: Secondary | ICD-10-CM | POA: Diagnosis not present

## 2017-08-19 DIAGNOSIS — M6281 Muscle weakness (generalized): Secondary | ICD-10-CM | POA: Diagnosis not present

## 2017-08-19 DIAGNOSIS — M25512 Pain in left shoulder: Secondary | ICD-10-CM | POA: Diagnosis not present

## 2017-08-19 DIAGNOSIS — N393 Stress incontinence (female) (male): Secondary | ICD-10-CM | POA: Diagnosis not present

## 2017-08-19 DIAGNOSIS — R2689 Other abnormalities of gait and mobility: Secondary | ICD-10-CM | POA: Diagnosis not present

## 2017-08-19 DIAGNOSIS — R2681 Unsteadiness on feet: Secondary | ICD-10-CM | POA: Diagnosis not present

## 2017-08-19 DIAGNOSIS — M546 Pain in thoracic spine: Secondary | ICD-10-CM | POA: Diagnosis not present

## 2017-08-20 DIAGNOSIS — N393 Stress incontinence (female) (male): Secondary | ICD-10-CM | POA: Diagnosis not present

## 2017-08-20 DIAGNOSIS — M25512 Pain in left shoulder: Secondary | ICD-10-CM | POA: Diagnosis not present

## 2017-08-20 DIAGNOSIS — R2681 Unsteadiness on feet: Secondary | ICD-10-CM | POA: Diagnosis not present

## 2017-08-20 DIAGNOSIS — R2689 Other abnormalities of gait and mobility: Secondary | ICD-10-CM | POA: Diagnosis not present

## 2017-08-20 DIAGNOSIS — M546 Pain in thoracic spine: Secondary | ICD-10-CM | POA: Diagnosis not present

## 2017-08-20 DIAGNOSIS — M6281 Muscle weakness (generalized): Secondary | ICD-10-CM | POA: Diagnosis not present

## 2017-08-23 DIAGNOSIS — M25512 Pain in left shoulder: Secondary | ICD-10-CM | POA: Diagnosis not present

## 2017-08-23 DIAGNOSIS — R2689 Other abnormalities of gait and mobility: Secondary | ICD-10-CM | POA: Diagnosis not present

## 2017-08-23 DIAGNOSIS — M6281 Muscle weakness (generalized): Secondary | ICD-10-CM | POA: Diagnosis not present

## 2017-08-23 DIAGNOSIS — M546 Pain in thoracic spine: Secondary | ICD-10-CM | POA: Diagnosis not present

## 2017-08-23 DIAGNOSIS — N393 Stress incontinence (female) (male): Secondary | ICD-10-CM | POA: Diagnosis not present

## 2017-08-23 DIAGNOSIS — R2681 Unsteadiness on feet: Secondary | ICD-10-CM | POA: Diagnosis not present

## 2017-08-24 DIAGNOSIS — M6281 Muscle weakness (generalized): Secondary | ICD-10-CM | POA: Diagnosis not present

## 2017-08-24 DIAGNOSIS — M546 Pain in thoracic spine: Secondary | ICD-10-CM | POA: Diagnosis not present

## 2017-08-24 DIAGNOSIS — M25512 Pain in left shoulder: Secondary | ICD-10-CM | POA: Diagnosis not present

## 2017-08-24 DIAGNOSIS — R2681 Unsteadiness on feet: Secondary | ICD-10-CM | POA: Diagnosis not present

## 2017-08-24 DIAGNOSIS — R2689 Other abnormalities of gait and mobility: Secondary | ICD-10-CM | POA: Diagnosis not present

## 2017-08-24 DIAGNOSIS — N393 Stress incontinence (female) (male): Secondary | ICD-10-CM | POA: Diagnosis not present

## 2017-08-25 DIAGNOSIS — R2681 Unsteadiness on feet: Secondary | ICD-10-CM | POA: Diagnosis not present

## 2017-08-25 DIAGNOSIS — M6281 Muscle weakness (generalized): Secondary | ICD-10-CM | POA: Diagnosis not present

## 2017-08-25 DIAGNOSIS — M546 Pain in thoracic spine: Secondary | ICD-10-CM | POA: Diagnosis not present

## 2017-08-25 DIAGNOSIS — R2689 Other abnormalities of gait and mobility: Secondary | ICD-10-CM | POA: Diagnosis not present

## 2017-08-25 DIAGNOSIS — N393 Stress incontinence (female) (male): Secondary | ICD-10-CM | POA: Diagnosis not present

## 2017-08-25 DIAGNOSIS — M25512 Pain in left shoulder: Secondary | ICD-10-CM | POA: Diagnosis not present

## 2017-08-26 DIAGNOSIS — M546 Pain in thoracic spine: Secondary | ICD-10-CM | POA: Diagnosis not present

## 2017-08-26 DIAGNOSIS — N393 Stress incontinence (female) (male): Secondary | ICD-10-CM | POA: Diagnosis not present

## 2017-08-26 DIAGNOSIS — R2689 Other abnormalities of gait and mobility: Secondary | ICD-10-CM | POA: Diagnosis not present

## 2017-08-26 DIAGNOSIS — M6281 Muscle weakness (generalized): Secondary | ICD-10-CM | POA: Diagnosis not present

## 2017-08-26 DIAGNOSIS — M25512 Pain in left shoulder: Secondary | ICD-10-CM | POA: Diagnosis not present

## 2017-08-26 DIAGNOSIS — R2681 Unsteadiness on feet: Secondary | ICD-10-CM | POA: Diagnosis not present

## 2017-08-27 DIAGNOSIS — N393 Stress incontinence (female) (male): Secondary | ICD-10-CM | POA: Diagnosis not present

## 2017-08-27 DIAGNOSIS — M546 Pain in thoracic spine: Secondary | ICD-10-CM | POA: Diagnosis not present

## 2017-08-27 DIAGNOSIS — R2689 Other abnormalities of gait and mobility: Secondary | ICD-10-CM | POA: Diagnosis not present

## 2017-08-27 DIAGNOSIS — M6281 Muscle weakness (generalized): Secondary | ICD-10-CM | POA: Diagnosis not present

## 2017-08-27 DIAGNOSIS — M25512 Pain in left shoulder: Secondary | ICD-10-CM | POA: Diagnosis not present

## 2017-08-27 DIAGNOSIS — R2681 Unsteadiness on feet: Secondary | ICD-10-CM | POA: Diagnosis not present

## 2017-08-31 DIAGNOSIS — M546 Pain in thoracic spine: Secondary | ICD-10-CM | POA: Diagnosis not present

## 2017-08-31 DIAGNOSIS — R2681 Unsteadiness on feet: Secondary | ICD-10-CM | POA: Diagnosis not present

## 2017-08-31 DIAGNOSIS — M25512 Pain in left shoulder: Secondary | ICD-10-CM | POA: Diagnosis not present

## 2017-08-31 DIAGNOSIS — M6281 Muscle weakness (generalized): Secondary | ICD-10-CM | POA: Diagnosis not present

## 2017-08-31 DIAGNOSIS — R2689 Other abnormalities of gait and mobility: Secondary | ICD-10-CM | POA: Diagnosis not present

## 2017-08-31 DIAGNOSIS — N393 Stress incontinence (female) (male): Secondary | ICD-10-CM | POA: Diagnosis not present

## 2017-09-01 DIAGNOSIS — R2681 Unsteadiness on feet: Secondary | ICD-10-CM | POA: Diagnosis not present

## 2017-09-01 DIAGNOSIS — M6281 Muscle weakness (generalized): Secondary | ICD-10-CM | POA: Diagnosis not present

## 2017-09-01 DIAGNOSIS — R2689 Other abnormalities of gait and mobility: Secondary | ICD-10-CM | POA: Diagnosis not present

## 2017-09-01 DIAGNOSIS — M25512 Pain in left shoulder: Secondary | ICD-10-CM | POA: Diagnosis not present

## 2017-09-01 DIAGNOSIS — N393 Stress incontinence (female) (male): Secondary | ICD-10-CM | POA: Diagnosis not present

## 2017-09-01 DIAGNOSIS — M546 Pain in thoracic spine: Secondary | ICD-10-CM | POA: Diagnosis not present

## 2017-09-02 DIAGNOSIS — M546 Pain in thoracic spine: Secondary | ICD-10-CM | POA: Diagnosis not present

## 2017-09-02 DIAGNOSIS — M25512 Pain in left shoulder: Secondary | ICD-10-CM | POA: Diagnosis not present

## 2017-09-02 DIAGNOSIS — M6281 Muscle weakness (generalized): Secondary | ICD-10-CM | POA: Diagnosis not present

## 2017-09-02 DIAGNOSIS — R2689 Other abnormalities of gait and mobility: Secondary | ICD-10-CM | POA: Diagnosis not present

## 2017-09-02 DIAGNOSIS — R2681 Unsteadiness on feet: Secondary | ICD-10-CM | POA: Diagnosis not present

## 2017-09-02 DIAGNOSIS — N393 Stress incontinence (female) (male): Secondary | ICD-10-CM | POA: Diagnosis not present

## 2017-09-03 DIAGNOSIS — M546 Pain in thoracic spine: Secondary | ICD-10-CM | POA: Diagnosis not present

## 2017-09-03 DIAGNOSIS — R2681 Unsteadiness on feet: Secondary | ICD-10-CM | POA: Diagnosis not present

## 2017-09-03 DIAGNOSIS — R2689 Other abnormalities of gait and mobility: Secondary | ICD-10-CM | POA: Diagnosis not present

## 2017-09-03 DIAGNOSIS — M25512 Pain in left shoulder: Secondary | ICD-10-CM | POA: Diagnosis not present

## 2017-09-03 DIAGNOSIS — M6281 Muscle weakness (generalized): Secondary | ICD-10-CM | POA: Diagnosis not present

## 2017-09-03 DIAGNOSIS — N393 Stress incontinence (female) (male): Secondary | ICD-10-CM | POA: Diagnosis not present

## 2017-09-07 DIAGNOSIS — N393 Stress incontinence (female) (male): Secondary | ICD-10-CM | POA: Diagnosis not present

## 2017-09-07 DIAGNOSIS — M546 Pain in thoracic spine: Secondary | ICD-10-CM | POA: Diagnosis not present

## 2017-09-07 DIAGNOSIS — R2681 Unsteadiness on feet: Secondary | ICD-10-CM | POA: Diagnosis not present

## 2017-09-07 DIAGNOSIS — R2689 Other abnormalities of gait and mobility: Secondary | ICD-10-CM | POA: Diagnosis not present

## 2017-09-07 DIAGNOSIS — M25512 Pain in left shoulder: Secondary | ICD-10-CM | POA: Diagnosis not present

## 2017-09-07 DIAGNOSIS — M6281 Muscle weakness (generalized): Secondary | ICD-10-CM | POA: Diagnosis not present

## 2017-09-08 DIAGNOSIS — R2689 Other abnormalities of gait and mobility: Secondary | ICD-10-CM | POA: Diagnosis not present

## 2017-09-08 DIAGNOSIS — M25512 Pain in left shoulder: Secondary | ICD-10-CM | POA: Diagnosis not present

## 2017-09-08 DIAGNOSIS — R2681 Unsteadiness on feet: Secondary | ICD-10-CM | POA: Diagnosis not present

## 2017-09-08 DIAGNOSIS — M546 Pain in thoracic spine: Secondary | ICD-10-CM | POA: Diagnosis not present

## 2017-09-08 DIAGNOSIS — M6281 Muscle weakness (generalized): Secondary | ICD-10-CM | POA: Diagnosis not present

## 2017-09-08 DIAGNOSIS — N393 Stress incontinence (female) (male): Secondary | ICD-10-CM | POA: Diagnosis not present

## 2017-09-09 DIAGNOSIS — M25512 Pain in left shoulder: Secondary | ICD-10-CM | POA: Diagnosis not present

## 2017-09-09 DIAGNOSIS — R2689 Other abnormalities of gait and mobility: Secondary | ICD-10-CM | POA: Diagnosis not present

## 2017-09-09 DIAGNOSIS — R2681 Unsteadiness on feet: Secondary | ICD-10-CM | POA: Diagnosis not present

## 2017-09-09 DIAGNOSIS — N393 Stress incontinence (female) (male): Secondary | ICD-10-CM | POA: Diagnosis not present

## 2017-09-09 DIAGNOSIS — M546 Pain in thoracic spine: Secondary | ICD-10-CM | POA: Diagnosis not present

## 2017-09-09 DIAGNOSIS — M6281 Muscle weakness (generalized): Secondary | ICD-10-CM | POA: Diagnosis not present

## 2017-09-10 DIAGNOSIS — M546 Pain in thoracic spine: Secondary | ICD-10-CM | POA: Diagnosis not present

## 2017-09-10 DIAGNOSIS — M25512 Pain in left shoulder: Secondary | ICD-10-CM | POA: Diagnosis not present

## 2017-09-10 DIAGNOSIS — R2681 Unsteadiness on feet: Secondary | ICD-10-CM | POA: Diagnosis not present

## 2017-09-10 DIAGNOSIS — M6281 Muscle weakness (generalized): Secondary | ICD-10-CM | POA: Diagnosis not present

## 2017-09-10 DIAGNOSIS — N393 Stress incontinence (female) (male): Secondary | ICD-10-CM | POA: Diagnosis not present

## 2017-09-10 DIAGNOSIS — R2689 Other abnormalities of gait and mobility: Secondary | ICD-10-CM | POA: Diagnosis not present

## 2017-09-14 DIAGNOSIS — R2681 Unsteadiness on feet: Secondary | ICD-10-CM | POA: Diagnosis not present

## 2017-09-14 DIAGNOSIS — N393 Stress incontinence (female) (male): Secondary | ICD-10-CM | POA: Diagnosis not present

## 2017-09-14 DIAGNOSIS — R2689 Other abnormalities of gait and mobility: Secondary | ICD-10-CM | POA: Diagnosis not present

## 2017-09-14 DIAGNOSIS — M6281 Muscle weakness (generalized): Secondary | ICD-10-CM | POA: Diagnosis not present

## 2017-09-14 DIAGNOSIS — M546 Pain in thoracic spine: Secondary | ICD-10-CM | POA: Diagnosis not present

## 2017-09-14 DIAGNOSIS — M25512 Pain in left shoulder: Secondary | ICD-10-CM | POA: Diagnosis not present

## 2017-09-15 DIAGNOSIS — R2689 Other abnormalities of gait and mobility: Secondary | ICD-10-CM | POA: Diagnosis not present

## 2017-09-15 DIAGNOSIS — N393 Stress incontinence (female) (male): Secondary | ICD-10-CM | POA: Diagnosis not present

## 2017-09-15 DIAGNOSIS — R2681 Unsteadiness on feet: Secondary | ICD-10-CM | POA: Diagnosis not present

## 2017-09-15 DIAGNOSIS — M546 Pain in thoracic spine: Secondary | ICD-10-CM | POA: Diagnosis not present

## 2017-09-15 DIAGNOSIS — M25512 Pain in left shoulder: Secondary | ICD-10-CM | POA: Diagnosis not present

## 2017-09-15 DIAGNOSIS — M6281 Muscle weakness (generalized): Secondary | ICD-10-CM | POA: Diagnosis not present

## 2017-09-16 DIAGNOSIS — N393 Stress incontinence (female) (male): Secondary | ICD-10-CM | POA: Diagnosis not present

## 2017-09-16 DIAGNOSIS — R2681 Unsteadiness on feet: Secondary | ICD-10-CM | POA: Diagnosis not present

## 2017-09-16 DIAGNOSIS — M546 Pain in thoracic spine: Secondary | ICD-10-CM | POA: Diagnosis not present

## 2017-09-16 DIAGNOSIS — M6281 Muscle weakness (generalized): Secondary | ICD-10-CM | POA: Diagnosis not present

## 2017-09-16 DIAGNOSIS — M25512 Pain in left shoulder: Secondary | ICD-10-CM | POA: Diagnosis not present

## 2017-09-16 DIAGNOSIS — R2689 Other abnormalities of gait and mobility: Secondary | ICD-10-CM | POA: Diagnosis not present

## 2017-09-17 DIAGNOSIS — M546 Pain in thoracic spine: Secondary | ICD-10-CM | POA: Diagnosis not present

## 2017-09-17 DIAGNOSIS — N393 Stress incontinence (female) (male): Secondary | ICD-10-CM | POA: Diagnosis not present

## 2017-09-17 DIAGNOSIS — M6281 Muscle weakness (generalized): Secondary | ICD-10-CM | POA: Diagnosis not present

## 2017-09-17 DIAGNOSIS — R2681 Unsteadiness on feet: Secondary | ICD-10-CM | POA: Diagnosis not present

## 2017-09-17 DIAGNOSIS — M25512 Pain in left shoulder: Secondary | ICD-10-CM | POA: Diagnosis not present

## 2017-09-17 DIAGNOSIS — R2689 Other abnormalities of gait and mobility: Secondary | ICD-10-CM | POA: Diagnosis not present

## 2017-09-20 DIAGNOSIS — R2689 Other abnormalities of gait and mobility: Secondary | ICD-10-CM | POA: Diagnosis not present

## 2017-09-20 DIAGNOSIS — N393 Stress incontinence (female) (male): Secondary | ICD-10-CM | POA: Diagnosis not present

## 2017-09-20 DIAGNOSIS — M6281 Muscle weakness (generalized): Secondary | ICD-10-CM | POA: Diagnosis not present

## 2017-09-20 DIAGNOSIS — R2681 Unsteadiness on feet: Secondary | ICD-10-CM | POA: Diagnosis not present

## 2017-09-20 DIAGNOSIS — M546 Pain in thoracic spine: Secondary | ICD-10-CM | POA: Diagnosis not present

## 2017-09-20 DIAGNOSIS — M25512 Pain in left shoulder: Secondary | ICD-10-CM | POA: Diagnosis not present

## 2017-09-23 DIAGNOSIS — M25512 Pain in left shoulder: Secondary | ICD-10-CM | POA: Diagnosis not present

## 2017-09-23 DIAGNOSIS — R2681 Unsteadiness on feet: Secondary | ICD-10-CM | POA: Diagnosis not present

## 2017-09-23 DIAGNOSIS — M6281 Muscle weakness (generalized): Secondary | ICD-10-CM | POA: Diagnosis not present

## 2017-09-23 DIAGNOSIS — N393 Stress incontinence (female) (male): Secondary | ICD-10-CM | POA: Diagnosis not present

## 2017-09-23 DIAGNOSIS — M546 Pain in thoracic spine: Secondary | ICD-10-CM | POA: Diagnosis not present

## 2017-09-23 DIAGNOSIS — R2689 Other abnormalities of gait and mobility: Secondary | ICD-10-CM | POA: Diagnosis not present

## 2017-09-24 DIAGNOSIS — R2681 Unsteadiness on feet: Secondary | ICD-10-CM | POA: Diagnosis not present

## 2017-09-24 DIAGNOSIS — R2689 Other abnormalities of gait and mobility: Secondary | ICD-10-CM | POA: Diagnosis not present

## 2017-09-24 DIAGNOSIS — M6281 Muscle weakness (generalized): Secondary | ICD-10-CM | POA: Diagnosis not present

## 2017-09-24 DIAGNOSIS — M546 Pain in thoracic spine: Secondary | ICD-10-CM | POA: Diagnosis not present

## 2017-09-24 DIAGNOSIS — M25512 Pain in left shoulder: Secondary | ICD-10-CM | POA: Diagnosis not present

## 2017-09-24 DIAGNOSIS — N393 Stress incontinence (female) (male): Secondary | ICD-10-CM | POA: Diagnosis not present

## 2017-09-28 DIAGNOSIS — M25512 Pain in left shoulder: Secondary | ICD-10-CM | POA: Diagnosis not present

## 2017-09-28 DIAGNOSIS — R2681 Unsteadiness on feet: Secondary | ICD-10-CM | POA: Diagnosis not present

## 2017-09-28 DIAGNOSIS — M546 Pain in thoracic spine: Secondary | ICD-10-CM | POA: Diagnosis not present

## 2017-09-28 DIAGNOSIS — M6281 Muscle weakness (generalized): Secondary | ICD-10-CM | POA: Diagnosis not present

## 2017-09-28 DIAGNOSIS — R2689 Other abnormalities of gait and mobility: Secondary | ICD-10-CM | POA: Diagnosis not present

## 2017-09-28 DIAGNOSIS — N393 Stress incontinence (female) (male): Secondary | ICD-10-CM | POA: Diagnosis not present

## 2017-09-29 DIAGNOSIS — R2689 Other abnormalities of gait and mobility: Secondary | ICD-10-CM | POA: Diagnosis not present

## 2017-09-29 DIAGNOSIS — R2681 Unsteadiness on feet: Secondary | ICD-10-CM | POA: Diagnosis not present

## 2017-09-29 DIAGNOSIS — N393 Stress incontinence (female) (male): Secondary | ICD-10-CM | POA: Diagnosis not present

## 2017-09-29 DIAGNOSIS — M6281 Muscle weakness (generalized): Secondary | ICD-10-CM | POA: Diagnosis not present

## 2017-09-29 DIAGNOSIS — M25512 Pain in left shoulder: Secondary | ICD-10-CM | POA: Diagnosis not present

## 2017-09-29 DIAGNOSIS — M546 Pain in thoracic spine: Secondary | ICD-10-CM | POA: Diagnosis not present

## 2017-09-30 DIAGNOSIS — M25512 Pain in left shoulder: Secondary | ICD-10-CM | POA: Diagnosis not present

## 2017-09-30 DIAGNOSIS — R2689 Other abnormalities of gait and mobility: Secondary | ICD-10-CM | POA: Diagnosis not present

## 2017-09-30 DIAGNOSIS — R2681 Unsteadiness on feet: Secondary | ICD-10-CM | POA: Diagnosis not present

## 2017-09-30 DIAGNOSIS — N393 Stress incontinence (female) (male): Secondary | ICD-10-CM | POA: Diagnosis not present

## 2017-09-30 DIAGNOSIS — M6281 Muscle weakness (generalized): Secondary | ICD-10-CM | POA: Diagnosis not present

## 2017-09-30 DIAGNOSIS — M546 Pain in thoracic spine: Secondary | ICD-10-CM | POA: Diagnosis not present

## 2017-10-01 DIAGNOSIS — R2689 Other abnormalities of gait and mobility: Secondary | ICD-10-CM | POA: Diagnosis not present

## 2017-10-01 DIAGNOSIS — N393 Stress incontinence (female) (male): Secondary | ICD-10-CM | POA: Diagnosis not present

## 2017-10-01 DIAGNOSIS — M6281 Muscle weakness (generalized): Secondary | ICD-10-CM | POA: Diagnosis not present

## 2017-10-01 DIAGNOSIS — M25512 Pain in left shoulder: Secondary | ICD-10-CM | POA: Diagnosis not present

## 2017-10-01 DIAGNOSIS — R2681 Unsteadiness on feet: Secondary | ICD-10-CM | POA: Diagnosis not present

## 2017-10-01 DIAGNOSIS — M546 Pain in thoracic spine: Secondary | ICD-10-CM | POA: Diagnosis not present

## 2017-10-05 DIAGNOSIS — M25512 Pain in left shoulder: Secondary | ICD-10-CM | POA: Diagnosis not present

## 2017-10-05 DIAGNOSIS — M6281 Muscle weakness (generalized): Secondary | ICD-10-CM | POA: Diagnosis not present

## 2017-10-05 DIAGNOSIS — R2689 Other abnormalities of gait and mobility: Secondary | ICD-10-CM | POA: Diagnosis not present

## 2017-10-05 DIAGNOSIS — N393 Stress incontinence (female) (male): Secondary | ICD-10-CM | POA: Diagnosis not present

## 2017-10-05 DIAGNOSIS — R2681 Unsteadiness on feet: Secondary | ICD-10-CM | POA: Diagnosis not present

## 2017-10-05 DIAGNOSIS — M546 Pain in thoracic spine: Secondary | ICD-10-CM | POA: Diagnosis not present

## 2017-10-06 DIAGNOSIS — M546 Pain in thoracic spine: Secondary | ICD-10-CM | POA: Diagnosis not present

## 2017-10-06 DIAGNOSIS — R2681 Unsteadiness on feet: Secondary | ICD-10-CM | POA: Diagnosis not present

## 2017-10-06 DIAGNOSIS — N393 Stress incontinence (female) (male): Secondary | ICD-10-CM | POA: Diagnosis not present

## 2017-10-06 DIAGNOSIS — M25512 Pain in left shoulder: Secondary | ICD-10-CM | POA: Diagnosis not present

## 2017-10-06 DIAGNOSIS — R2689 Other abnormalities of gait and mobility: Secondary | ICD-10-CM | POA: Diagnosis not present

## 2017-10-06 DIAGNOSIS — M6281 Muscle weakness (generalized): Secondary | ICD-10-CM | POA: Diagnosis not present

## 2017-10-07 DIAGNOSIS — R2689 Other abnormalities of gait and mobility: Secondary | ICD-10-CM | POA: Diagnosis not present

## 2017-10-07 DIAGNOSIS — R2681 Unsteadiness on feet: Secondary | ICD-10-CM | POA: Diagnosis not present

## 2017-10-07 DIAGNOSIS — M546 Pain in thoracic spine: Secondary | ICD-10-CM | POA: Diagnosis not present

## 2017-10-07 DIAGNOSIS — M25512 Pain in left shoulder: Secondary | ICD-10-CM | POA: Diagnosis not present

## 2017-10-07 DIAGNOSIS — M6281 Muscle weakness (generalized): Secondary | ICD-10-CM | POA: Diagnosis not present

## 2017-10-07 DIAGNOSIS — N393 Stress incontinence (female) (male): Secondary | ICD-10-CM | POA: Diagnosis not present

## 2017-10-08 DIAGNOSIS — M6281 Muscle weakness (generalized): Secondary | ICD-10-CM | POA: Diagnosis not present

## 2017-10-08 DIAGNOSIS — M25512 Pain in left shoulder: Secondary | ICD-10-CM | POA: Diagnosis not present

## 2017-10-08 DIAGNOSIS — R2689 Other abnormalities of gait and mobility: Secondary | ICD-10-CM | POA: Diagnosis not present

## 2017-10-08 DIAGNOSIS — M546 Pain in thoracic spine: Secondary | ICD-10-CM | POA: Diagnosis not present

## 2017-10-08 DIAGNOSIS — N393 Stress incontinence (female) (male): Secondary | ICD-10-CM | POA: Diagnosis not present

## 2017-10-08 DIAGNOSIS — R2681 Unsteadiness on feet: Secondary | ICD-10-CM | POA: Diagnosis not present

## 2017-10-12 DIAGNOSIS — R2681 Unsteadiness on feet: Secondary | ICD-10-CM | POA: Diagnosis not present

## 2017-10-12 DIAGNOSIS — R2689 Other abnormalities of gait and mobility: Secondary | ICD-10-CM | POA: Diagnosis not present

## 2017-10-12 DIAGNOSIS — M546 Pain in thoracic spine: Secondary | ICD-10-CM | POA: Diagnosis not present

## 2017-10-12 DIAGNOSIS — M6281 Muscle weakness (generalized): Secondary | ICD-10-CM | POA: Diagnosis not present

## 2017-10-12 DIAGNOSIS — N393 Stress incontinence (female) (male): Secondary | ICD-10-CM | POA: Diagnosis not present

## 2017-10-12 DIAGNOSIS — M25512 Pain in left shoulder: Secondary | ICD-10-CM | POA: Diagnosis not present

## 2017-10-13 DIAGNOSIS — N393 Stress incontinence (female) (male): Secondary | ICD-10-CM | POA: Diagnosis not present

## 2017-10-13 DIAGNOSIS — M546 Pain in thoracic spine: Secondary | ICD-10-CM | POA: Diagnosis not present

## 2017-10-13 DIAGNOSIS — M25512 Pain in left shoulder: Secondary | ICD-10-CM | POA: Diagnosis not present

## 2017-10-13 DIAGNOSIS — M6281 Muscle weakness (generalized): Secondary | ICD-10-CM | POA: Diagnosis not present

## 2017-10-13 DIAGNOSIS — R2689 Other abnormalities of gait and mobility: Secondary | ICD-10-CM | POA: Diagnosis not present

## 2017-10-13 DIAGNOSIS — R2681 Unsteadiness on feet: Secondary | ICD-10-CM | POA: Diagnosis not present

## 2017-10-14 DIAGNOSIS — M546 Pain in thoracic spine: Secondary | ICD-10-CM | POA: Diagnosis not present

## 2017-10-14 DIAGNOSIS — R2681 Unsteadiness on feet: Secondary | ICD-10-CM | POA: Diagnosis not present

## 2017-10-14 DIAGNOSIS — N393 Stress incontinence (female) (male): Secondary | ICD-10-CM | POA: Diagnosis not present

## 2017-10-14 DIAGNOSIS — M25512 Pain in left shoulder: Secondary | ICD-10-CM | POA: Diagnosis not present

## 2017-10-14 DIAGNOSIS — M6281 Muscle weakness (generalized): Secondary | ICD-10-CM | POA: Diagnosis not present

## 2017-10-14 DIAGNOSIS — R2689 Other abnormalities of gait and mobility: Secondary | ICD-10-CM | POA: Diagnosis not present

## 2017-10-15 DIAGNOSIS — R2689 Other abnormalities of gait and mobility: Secondary | ICD-10-CM | POA: Diagnosis not present

## 2017-10-15 DIAGNOSIS — N393 Stress incontinence (female) (male): Secondary | ICD-10-CM | POA: Diagnosis not present

## 2017-10-15 DIAGNOSIS — M6281 Muscle weakness (generalized): Secondary | ICD-10-CM | POA: Diagnosis not present

## 2017-10-15 DIAGNOSIS — R2681 Unsteadiness on feet: Secondary | ICD-10-CM | POA: Diagnosis not present

## 2017-10-15 DIAGNOSIS — M25512 Pain in left shoulder: Secondary | ICD-10-CM | POA: Diagnosis not present

## 2017-10-15 DIAGNOSIS — M546 Pain in thoracic spine: Secondary | ICD-10-CM | POA: Diagnosis not present

## 2017-10-19 DIAGNOSIS — M6281 Muscle weakness (generalized): Secondary | ICD-10-CM | POA: Diagnosis not present

## 2017-10-19 DIAGNOSIS — M546 Pain in thoracic spine: Secondary | ICD-10-CM | POA: Diagnosis not present

## 2017-10-19 DIAGNOSIS — R2681 Unsteadiness on feet: Secondary | ICD-10-CM | POA: Diagnosis not present

## 2017-10-19 DIAGNOSIS — M25512 Pain in left shoulder: Secondary | ICD-10-CM | POA: Diagnosis not present

## 2017-10-19 DIAGNOSIS — R2689 Other abnormalities of gait and mobility: Secondary | ICD-10-CM | POA: Diagnosis not present

## 2017-10-19 DIAGNOSIS — N393 Stress incontinence (female) (male): Secondary | ICD-10-CM | POA: Diagnosis not present

## 2017-10-21 DIAGNOSIS — R2681 Unsteadiness on feet: Secondary | ICD-10-CM | POA: Diagnosis not present

## 2017-10-21 DIAGNOSIS — M25512 Pain in left shoulder: Secondary | ICD-10-CM | POA: Diagnosis not present

## 2017-10-21 DIAGNOSIS — M6281 Muscle weakness (generalized): Secondary | ICD-10-CM | POA: Diagnosis not present

## 2017-10-21 DIAGNOSIS — M546 Pain in thoracic spine: Secondary | ICD-10-CM | POA: Diagnosis not present

## 2017-10-21 DIAGNOSIS — N393 Stress incontinence (female) (male): Secondary | ICD-10-CM | POA: Diagnosis not present

## 2017-10-21 DIAGNOSIS — R2689 Other abnormalities of gait and mobility: Secondary | ICD-10-CM | POA: Diagnosis not present

## 2017-10-22 DIAGNOSIS — M25512 Pain in left shoulder: Secondary | ICD-10-CM | POA: Diagnosis not present

## 2017-10-22 DIAGNOSIS — R2681 Unsteadiness on feet: Secondary | ICD-10-CM | POA: Diagnosis not present

## 2017-10-22 DIAGNOSIS — M546 Pain in thoracic spine: Secondary | ICD-10-CM | POA: Diagnosis not present

## 2017-10-22 DIAGNOSIS — N393 Stress incontinence (female) (male): Secondary | ICD-10-CM | POA: Diagnosis not present

## 2017-10-22 DIAGNOSIS — M6281 Muscle weakness (generalized): Secondary | ICD-10-CM | POA: Diagnosis not present

## 2017-10-22 DIAGNOSIS — R2689 Other abnormalities of gait and mobility: Secondary | ICD-10-CM | POA: Diagnosis not present

## 2017-10-26 DIAGNOSIS — R2681 Unsteadiness on feet: Secondary | ICD-10-CM | POA: Diagnosis not present

## 2017-10-26 DIAGNOSIS — M546 Pain in thoracic spine: Secondary | ICD-10-CM | POA: Diagnosis not present

## 2017-10-26 DIAGNOSIS — M6281 Muscle weakness (generalized): Secondary | ICD-10-CM | POA: Diagnosis not present

## 2017-10-26 DIAGNOSIS — R2689 Other abnormalities of gait and mobility: Secondary | ICD-10-CM | POA: Diagnosis not present

## 2017-10-26 DIAGNOSIS — M25512 Pain in left shoulder: Secondary | ICD-10-CM | POA: Diagnosis not present

## 2017-10-26 DIAGNOSIS — N393 Stress incontinence (female) (male): Secondary | ICD-10-CM | POA: Diagnosis not present

## 2017-10-27 DIAGNOSIS — M6281 Muscle weakness (generalized): Secondary | ICD-10-CM | POA: Diagnosis not present

## 2017-10-27 DIAGNOSIS — M546 Pain in thoracic spine: Secondary | ICD-10-CM | POA: Diagnosis not present

## 2017-10-27 DIAGNOSIS — R2689 Other abnormalities of gait and mobility: Secondary | ICD-10-CM | POA: Diagnosis not present

## 2017-10-27 DIAGNOSIS — R2681 Unsteadiness on feet: Secondary | ICD-10-CM | POA: Diagnosis not present

## 2017-10-27 DIAGNOSIS — M25512 Pain in left shoulder: Secondary | ICD-10-CM | POA: Diagnosis not present

## 2017-10-27 DIAGNOSIS — N393 Stress incontinence (female) (male): Secondary | ICD-10-CM | POA: Diagnosis not present

## 2017-10-28 DIAGNOSIS — M25512 Pain in left shoulder: Secondary | ICD-10-CM | POA: Diagnosis not present

## 2017-10-28 DIAGNOSIS — R2681 Unsteadiness on feet: Secondary | ICD-10-CM | POA: Diagnosis not present

## 2017-10-28 DIAGNOSIS — M6281 Muscle weakness (generalized): Secondary | ICD-10-CM | POA: Diagnosis not present

## 2017-10-28 DIAGNOSIS — M546 Pain in thoracic spine: Secondary | ICD-10-CM | POA: Diagnosis not present

## 2017-10-28 DIAGNOSIS — N393 Stress incontinence (female) (male): Secondary | ICD-10-CM | POA: Diagnosis not present

## 2017-10-28 DIAGNOSIS — R2689 Other abnormalities of gait and mobility: Secondary | ICD-10-CM | POA: Diagnosis not present

## 2017-10-29 DIAGNOSIS — Z79899 Other long term (current) drug therapy: Secondary | ICD-10-CM | POA: Diagnosis not present

## 2017-10-29 DIAGNOSIS — I482 Chronic atrial fibrillation: Secondary | ICD-10-CM | POA: Diagnosis not present

## 2017-10-29 DIAGNOSIS — I10 Essential (primary) hypertension: Secondary | ICD-10-CM | POA: Diagnosis not present

## 2017-10-29 DIAGNOSIS — Z7902 Long term (current) use of antithrombotics/antiplatelets: Secondary | ICD-10-CM | POA: Diagnosis not present

## 2017-11-02 DIAGNOSIS — N393 Stress incontinence (female) (male): Secondary | ICD-10-CM | POA: Diagnosis not present

## 2017-11-02 DIAGNOSIS — M6281 Muscle weakness (generalized): Secondary | ICD-10-CM | POA: Diagnosis not present

## 2017-11-02 DIAGNOSIS — R2681 Unsteadiness on feet: Secondary | ICD-10-CM | POA: Diagnosis not present

## 2017-11-02 DIAGNOSIS — M25512 Pain in left shoulder: Secondary | ICD-10-CM | POA: Diagnosis not present

## 2017-11-02 DIAGNOSIS — M546 Pain in thoracic spine: Secondary | ICD-10-CM | POA: Diagnosis not present

## 2017-11-02 DIAGNOSIS — R2689 Other abnormalities of gait and mobility: Secondary | ICD-10-CM | POA: Diagnosis not present

## 2017-11-04 DIAGNOSIS — R2689 Other abnormalities of gait and mobility: Secondary | ICD-10-CM | POA: Diagnosis not present

## 2017-11-04 DIAGNOSIS — M6281 Muscle weakness (generalized): Secondary | ICD-10-CM | POA: Diagnosis not present

## 2017-11-04 DIAGNOSIS — M25512 Pain in left shoulder: Secondary | ICD-10-CM | POA: Diagnosis not present

## 2017-11-04 DIAGNOSIS — R2681 Unsteadiness on feet: Secondary | ICD-10-CM | POA: Diagnosis not present

## 2017-11-04 DIAGNOSIS — N393 Stress incontinence (female) (male): Secondary | ICD-10-CM | POA: Diagnosis not present

## 2017-11-04 DIAGNOSIS — M546 Pain in thoracic spine: Secondary | ICD-10-CM | POA: Diagnosis not present

## 2017-11-05 DIAGNOSIS — R2689 Other abnormalities of gait and mobility: Secondary | ICD-10-CM | POA: Diagnosis not present

## 2017-11-05 DIAGNOSIS — R2681 Unsteadiness on feet: Secondary | ICD-10-CM | POA: Diagnosis not present

## 2017-11-05 DIAGNOSIS — M6281 Muscle weakness (generalized): Secondary | ICD-10-CM | POA: Diagnosis not present

## 2017-11-05 DIAGNOSIS — M546 Pain in thoracic spine: Secondary | ICD-10-CM | POA: Diagnosis not present

## 2017-11-05 DIAGNOSIS — M25512 Pain in left shoulder: Secondary | ICD-10-CM | POA: Diagnosis not present

## 2017-11-05 DIAGNOSIS — N393 Stress incontinence (female) (male): Secondary | ICD-10-CM | POA: Diagnosis not present

## 2017-11-09 DIAGNOSIS — M25512 Pain in left shoulder: Secondary | ICD-10-CM | POA: Diagnosis not present

## 2017-11-09 DIAGNOSIS — R2689 Other abnormalities of gait and mobility: Secondary | ICD-10-CM | POA: Diagnosis not present

## 2017-11-09 DIAGNOSIS — N393 Stress incontinence (female) (male): Secondary | ICD-10-CM | POA: Diagnosis not present

## 2017-11-09 DIAGNOSIS — M546 Pain in thoracic spine: Secondary | ICD-10-CM | POA: Diagnosis not present

## 2017-11-09 DIAGNOSIS — R2681 Unsteadiness on feet: Secondary | ICD-10-CM | POA: Diagnosis not present

## 2017-11-09 DIAGNOSIS — M6281 Muscle weakness (generalized): Secondary | ICD-10-CM | POA: Diagnosis not present

## 2017-11-10 DIAGNOSIS — N393 Stress incontinence (female) (male): Secondary | ICD-10-CM | POA: Diagnosis not present

## 2017-11-10 DIAGNOSIS — R2681 Unsteadiness on feet: Secondary | ICD-10-CM | POA: Diagnosis not present

## 2017-11-10 DIAGNOSIS — M6281 Muscle weakness (generalized): Secondary | ICD-10-CM | POA: Diagnosis not present

## 2017-11-10 DIAGNOSIS — M546 Pain in thoracic spine: Secondary | ICD-10-CM | POA: Diagnosis not present

## 2017-11-10 DIAGNOSIS — M25512 Pain in left shoulder: Secondary | ICD-10-CM | POA: Diagnosis not present

## 2017-11-10 DIAGNOSIS — R2689 Other abnormalities of gait and mobility: Secondary | ICD-10-CM | POA: Diagnosis not present

## 2017-11-12 DIAGNOSIS — R2681 Unsteadiness on feet: Secondary | ICD-10-CM | POA: Diagnosis not present

## 2017-11-12 DIAGNOSIS — R2689 Other abnormalities of gait and mobility: Secondary | ICD-10-CM | POA: Diagnosis not present

## 2017-11-12 DIAGNOSIS — N393 Stress incontinence (female) (male): Secondary | ICD-10-CM | POA: Diagnosis not present

## 2017-11-12 DIAGNOSIS — M546 Pain in thoracic spine: Secondary | ICD-10-CM | POA: Diagnosis not present

## 2017-11-12 DIAGNOSIS — M25512 Pain in left shoulder: Secondary | ICD-10-CM | POA: Diagnosis not present

## 2017-11-12 DIAGNOSIS — M6281 Muscle weakness (generalized): Secondary | ICD-10-CM | POA: Diagnosis not present

## 2017-11-16 DIAGNOSIS — M546 Pain in thoracic spine: Secondary | ICD-10-CM | POA: Diagnosis not present

## 2017-11-16 DIAGNOSIS — R2681 Unsteadiness on feet: Secondary | ICD-10-CM | POA: Diagnosis not present

## 2017-11-16 DIAGNOSIS — N393 Stress incontinence (female) (male): Secondary | ICD-10-CM | POA: Diagnosis not present

## 2017-11-16 DIAGNOSIS — R2689 Other abnormalities of gait and mobility: Secondary | ICD-10-CM | POA: Diagnosis not present

## 2017-11-16 DIAGNOSIS — M25512 Pain in left shoulder: Secondary | ICD-10-CM | POA: Diagnosis not present

## 2017-11-16 DIAGNOSIS — M6281 Muscle weakness (generalized): Secondary | ICD-10-CM | POA: Diagnosis not present

## 2017-11-17 DIAGNOSIS — R2681 Unsteadiness on feet: Secondary | ICD-10-CM | POA: Diagnosis not present

## 2017-11-17 DIAGNOSIS — R2689 Other abnormalities of gait and mobility: Secondary | ICD-10-CM | POA: Diagnosis not present

## 2017-11-17 DIAGNOSIS — N393 Stress incontinence (female) (male): Secondary | ICD-10-CM | POA: Diagnosis not present

## 2017-11-17 DIAGNOSIS — M6281 Muscle weakness (generalized): Secondary | ICD-10-CM | POA: Diagnosis not present

## 2017-11-17 DIAGNOSIS — M546 Pain in thoracic spine: Secondary | ICD-10-CM | POA: Diagnosis not present

## 2017-11-17 DIAGNOSIS — M25512 Pain in left shoulder: Secondary | ICD-10-CM | POA: Diagnosis not present

## 2017-11-18 DIAGNOSIS — M546 Pain in thoracic spine: Secondary | ICD-10-CM | POA: Diagnosis not present

## 2017-11-18 DIAGNOSIS — M6281 Muscle weakness (generalized): Secondary | ICD-10-CM | POA: Diagnosis not present

## 2017-11-18 DIAGNOSIS — R2681 Unsteadiness on feet: Secondary | ICD-10-CM | POA: Diagnosis not present

## 2017-11-18 DIAGNOSIS — R3915 Urgency of urination: Secondary | ICD-10-CM | POA: Diagnosis not present

## 2017-11-18 DIAGNOSIS — M25512 Pain in left shoulder: Secondary | ICD-10-CM | POA: Diagnosis not present

## 2017-11-18 DIAGNOSIS — N393 Stress incontinence (female) (male): Secondary | ICD-10-CM | POA: Diagnosis not present

## 2017-11-18 DIAGNOSIS — R300 Dysuria: Secondary | ICD-10-CM | POA: Diagnosis not present

## 2017-11-18 DIAGNOSIS — R2689 Other abnormalities of gait and mobility: Secondary | ICD-10-CM | POA: Diagnosis not present

## 2017-11-19 DIAGNOSIS — M546 Pain in thoracic spine: Secondary | ICD-10-CM | POA: Diagnosis not present

## 2017-11-19 DIAGNOSIS — R2681 Unsteadiness on feet: Secondary | ICD-10-CM | POA: Diagnosis not present

## 2017-11-19 DIAGNOSIS — R2689 Other abnormalities of gait and mobility: Secondary | ICD-10-CM | POA: Diagnosis not present

## 2017-11-19 DIAGNOSIS — N393 Stress incontinence (female) (male): Secondary | ICD-10-CM | POA: Diagnosis not present

## 2017-11-19 DIAGNOSIS — M6281 Muscle weakness (generalized): Secondary | ICD-10-CM | POA: Diagnosis not present

## 2017-11-19 DIAGNOSIS — M25512 Pain in left shoulder: Secondary | ICD-10-CM | POA: Diagnosis not present

## 2017-11-23 DIAGNOSIS — R2689 Other abnormalities of gait and mobility: Secondary | ICD-10-CM | POA: Diagnosis not present

## 2017-11-23 DIAGNOSIS — N393 Stress incontinence (female) (male): Secondary | ICD-10-CM | POA: Diagnosis not present

## 2017-11-23 DIAGNOSIS — R2681 Unsteadiness on feet: Secondary | ICD-10-CM | POA: Diagnosis not present

## 2017-11-23 DIAGNOSIS — M25512 Pain in left shoulder: Secondary | ICD-10-CM | POA: Diagnosis not present

## 2017-11-23 DIAGNOSIS — M546 Pain in thoracic spine: Secondary | ICD-10-CM | POA: Diagnosis not present

## 2017-11-23 DIAGNOSIS — M6281 Muscle weakness (generalized): Secondary | ICD-10-CM | POA: Diagnosis not present

## 2017-11-24 DIAGNOSIS — M546 Pain in thoracic spine: Secondary | ICD-10-CM | POA: Diagnosis not present

## 2017-11-24 DIAGNOSIS — R2689 Other abnormalities of gait and mobility: Secondary | ICD-10-CM | POA: Diagnosis not present

## 2017-11-24 DIAGNOSIS — M6281 Muscle weakness (generalized): Secondary | ICD-10-CM | POA: Diagnosis not present

## 2017-11-24 DIAGNOSIS — M25512 Pain in left shoulder: Secondary | ICD-10-CM | POA: Diagnosis not present

## 2017-11-24 DIAGNOSIS — R2681 Unsteadiness on feet: Secondary | ICD-10-CM | POA: Diagnosis not present

## 2017-11-24 DIAGNOSIS — N393 Stress incontinence (female) (male): Secondary | ICD-10-CM | POA: Diagnosis not present

## 2017-11-25 DIAGNOSIS — N393 Stress incontinence (female) (male): Secondary | ICD-10-CM | POA: Diagnosis not present

## 2017-11-25 DIAGNOSIS — M546 Pain in thoracic spine: Secondary | ICD-10-CM | POA: Diagnosis not present

## 2017-11-25 DIAGNOSIS — M25512 Pain in left shoulder: Secondary | ICD-10-CM | POA: Diagnosis not present

## 2017-11-25 DIAGNOSIS — M6281 Muscle weakness (generalized): Secondary | ICD-10-CM | POA: Diagnosis not present

## 2017-11-25 DIAGNOSIS — R2689 Other abnormalities of gait and mobility: Secondary | ICD-10-CM | POA: Diagnosis not present

## 2017-11-25 DIAGNOSIS — R2681 Unsteadiness on feet: Secondary | ICD-10-CM | POA: Diagnosis not present

## 2017-11-26 DIAGNOSIS — R2689 Other abnormalities of gait and mobility: Secondary | ICD-10-CM | POA: Diagnosis not present

## 2017-11-26 DIAGNOSIS — R2681 Unsteadiness on feet: Secondary | ICD-10-CM | POA: Diagnosis not present

## 2017-11-26 DIAGNOSIS — M25512 Pain in left shoulder: Secondary | ICD-10-CM | POA: Diagnosis not present

## 2017-11-26 DIAGNOSIS — M6281 Muscle weakness (generalized): Secondary | ICD-10-CM | POA: Diagnosis not present

## 2017-11-26 DIAGNOSIS — M546 Pain in thoracic spine: Secondary | ICD-10-CM | POA: Diagnosis not present

## 2017-11-26 DIAGNOSIS — N393 Stress incontinence (female) (male): Secondary | ICD-10-CM | POA: Diagnosis not present

## 2017-11-29 DIAGNOSIS — R2681 Unsteadiness on feet: Secondary | ICD-10-CM | POA: Diagnosis not present

## 2017-11-29 DIAGNOSIS — M25512 Pain in left shoulder: Secondary | ICD-10-CM | POA: Diagnosis not present

## 2017-11-29 DIAGNOSIS — N393 Stress incontinence (female) (male): Secondary | ICD-10-CM | POA: Diagnosis not present

## 2017-11-29 DIAGNOSIS — R2689 Other abnormalities of gait and mobility: Secondary | ICD-10-CM | POA: Diagnosis not present

## 2017-11-29 DIAGNOSIS — M6281 Muscle weakness (generalized): Secondary | ICD-10-CM | POA: Diagnosis not present

## 2017-11-29 DIAGNOSIS — M546 Pain in thoracic spine: Secondary | ICD-10-CM | POA: Diagnosis not present

## 2017-11-30 DIAGNOSIS — R2681 Unsteadiness on feet: Secondary | ICD-10-CM | POA: Diagnosis not present

## 2017-11-30 DIAGNOSIS — M25512 Pain in left shoulder: Secondary | ICD-10-CM | POA: Diagnosis not present

## 2017-11-30 DIAGNOSIS — N393 Stress incontinence (female) (male): Secondary | ICD-10-CM | POA: Diagnosis not present

## 2017-11-30 DIAGNOSIS — R2689 Other abnormalities of gait and mobility: Secondary | ICD-10-CM | POA: Diagnosis not present

## 2017-11-30 DIAGNOSIS — M6281 Muscle weakness (generalized): Secondary | ICD-10-CM | POA: Diagnosis not present

## 2017-11-30 DIAGNOSIS — M546 Pain in thoracic spine: Secondary | ICD-10-CM | POA: Diagnosis not present

## 2017-12-01 DIAGNOSIS — N393 Stress incontinence (female) (male): Secondary | ICD-10-CM | POA: Diagnosis not present

## 2017-12-01 DIAGNOSIS — M6281 Muscle weakness (generalized): Secondary | ICD-10-CM | POA: Diagnosis not present

## 2017-12-01 DIAGNOSIS — M546 Pain in thoracic spine: Secondary | ICD-10-CM | POA: Diagnosis not present

## 2017-12-01 DIAGNOSIS — M25512 Pain in left shoulder: Secondary | ICD-10-CM | POA: Diagnosis not present

## 2017-12-01 DIAGNOSIS — R2681 Unsteadiness on feet: Secondary | ICD-10-CM | POA: Diagnosis not present

## 2017-12-01 DIAGNOSIS — R2689 Other abnormalities of gait and mobility: Secondary | ICD-10-CM | POA: Diagnosis not present

## 2017-12-02 DIAGNOSIS — R2681 Unsteadiness on feet: Secondary | ICD-10-CM | POA: Diagnosis not present

## 2017-12-02 DIAGNOSIS — M25512 Pain in left shoulder: Secondary | ICD-10-CM | POA: Diagnosis not present

## 2017-12-02 DIAGNOSIS — R2689 Other abnormalities of gait and mobility: Secondary | ICD-10-CM | POA: Diagnosis not present

## 2017-12-02 DIAGNOSIS — M6281 Muscle weakness (generalized): Secondary | ICD-10-CM | POA: Diagnosis not present

## 2017-12-02 DIAGNOSIS — N393 Stress incontinence (female) (male): Secondary | ICD-10-CM | POA: Diagnosis not present

## 2017-12-02 DIAGNOSIS — M546 Pain in thoracic spine: Secondary | ICD-10-CM | POA: Diagnosis not present

## 2017-12-03 DIAGNOSIS — R2689 Other abnormalities of gait and mobility: Secondary | ICD-10-CM | POA: Diagnosis not present

## 2017-12-03 DIAGNOSIS — M6281 Muscle weakness (generalized): Secondary | ICD-10-CM | POA: Diagnosis not present

## 2017-12-03 DIAGNOSIS — M25512 Pain in left shoulder: Secondary | ICD-10-CM | POA: Diagnosis not present

## 2017-12-03 DIAGNOSIS — N393 Stress incontinence (female) (male): Secondary | ICD-10-CM | POA: Diagnosis not present

## 2017-12-03 DIAGNOSIS — M546 Pain in thoracic spine: Secondary | ICD-10-CM | POA: Diagnosis not present

## 2017-12-03 DIAGNOSIS — R2681 Unsteadiness on feet: Secondary | ICD-10-CM | POA: Diagnosis not present

## 2017-12-06 DIAGNOSIS — R2681 Unsteadiness on feet: Secondary | ICD-10-CM | POA: Diagnosis not present

## 2017-12-06 DIAGNOSIS — M6281 Muscle weakness (generalized): Secondary | ICD-10-CM | POA: Diagnosis not present

## 2017-12-06 DIAGNOSIS — M25512 Pain in left shoulder: Secondary | ICD-10-CM | POA: Diagnosis not present

## 2017-12-06 DIAGNOSIS — R2689 Other abnormalities of gait and mobility: Secondary | ICD-10-CM | POA: Diagnosis not present

## 2017-12-06 DIAGNOSIS — M546 Pain in thoracic spine: Secondary | ICD-10-CM | POA: Diagnosis not present

## 2017-12-06 DIAGNOSIS — N393 Stress incontinence (female) (male): Secondary | ICD-10-CM | POA: Diagnosis not present

## 2017-12-07 DIAGNOSIS — N393 Stress incontinence (female) (male): Secondary | ICD-10-CM | POA: Diagnosis not present

## 2017-12-07 DIAGNOSIS — R2681 Unsteadiness on feet: Secondary | ICD-10-CM | POA: Diagnosis not present

## 2017-12-07 DIAGNOSIS — M546 Pain in thoracic spine: Secondary | ICD-10-CM | POA: Diagnosis not present

## 2017-12-07 DIAGNOSIS — M25512 Pain in left shoulder: Secondary | ICD-10-CM | POA: Diagnosis not present

## 2017-12-07 DIAGNOSIS — R2689 Other abnormalities of gait and mobility: Secondary | ICD-10-CM | POA: Diagnosis not present

## 2017-12-07 DIAGNOSIS — M6281 Muscle weakness (generalized): Secondary | ICD-10-CM | POA: Diagnosis not present

## 2017-12-08 DIAGNOSIS — R2681 Unsteadiness on feet: Secondary | ICD-10-CM | POA: Diagnosis not present

## 2017-12-08 DIAGNOSIS — N393 Stress incontinence (female) (male): Secondary | ICD-10-CM | POA: Diagnosis not present

## 2017-12-08 DIAGNOSIS — M546 Pain in thoracic spine: Secondary | ICD-10-CM | POA: Diagnosis not present

## 2017-12-08 DIAGNOSIS — M25512 Pain in left shoulder: Secondary | ICD-10-CM | POA: Diagnosis not present

## 2017-12-08 DIAGNOSIS — M6281 Muscle weakness (generalized): Secondary | ICD-10-CM | POA: Diagnosis not present

## 2017-12-08 DIAGNOSIS — R2689 Other abnormalities of gait and mobility: Secondary | ICD-10-CM | POA: Diagnosis not present

## 2017-12-09 DIAGNOSIS — R2681 Unsteadiness on feet: Secondary | ICD-10-CM | POA: Diagnosis not present

## 2017-12-09 DIAGNOSIS — M546 Pain in thoracic spine: Secondary | ICD-10-CM | POA: Diagnosis not present

## 2017-12-09 DIAGNOSIS — M25512 Pain in left shoulder: Secondary | ICD-10-CM | POA: Diagnosis not present

## 2017-12-09 DIAGNOSIS — M6281 Muscle weakness (generalized): Secondary | ICD-10-CM | POA: Diagnosis not present

## 2017-12-09 DIAGNOSIS — N393 Stress incontinence (female) (male): Secondary | ICD-10-CM | POA: Diagnosis not present

## 2017-12-09 DIAGNOSIS — R2689 Other abnormalities of gait and mobility: Secondary | ICD-10-CM | POA: Diagnosis not present

## 2017-12-10 DIAGNOSIS — M25512 Pain in left shoulder: Secondary | ICD-10-CM | POA: Diagnosis not present

## 2017-12-10 DIAGNOSIS — R2689 Other abnormalities of gait and mobility: Secondary | ICD-10-CM | POA: Diagnosis not present

## 2017-12-10 DIAGNOSIS — R2681 Unsteadiness on feet: Secondary | ICD-10-CM | POA: Diagnosis not present

## 2017-12-10 DIAGNOSIS — M6281 Muscle weakness (generalized): Secondary | ICD-10-CM | POA: Diagnosis not present

## 2017-12-10 DIAGNOSIS — N393 Stress incontinence (female) (male): Secondary | ICD-10-CM | POA: Diagnosis not present

## 2017-12-10 DIAGNOSIS — M546 Pain in thoracic spine: Secondary | ICD-10-CM | POA: Diagnosis not present

## 2017-12-13 DIAGNOSIS — R2689 Other abnormalities of gait and mobility: Secondary | ICD-10-CM | POA: Diagnosis not present

## 2017-12-13 DIAGNOSIS — M6281 Muscle weakness (generalized): Secondary | ICD-10-CM | POA: Diagnosis not present

## 2017-12-13 DIAGNOSIS — N393 Stress incontinence (female) (male): Secondary | ICD-10-CM | POA: Diagnosis not present

## 2017-12-13 DIAGNOSIS — R2681 Unsteadiness on feet: Secondary | ICD-10-CM | POA: Diagnosis not present

## 2017-12-13 DIAGNOSIS — M546 Pain in thoracic spine: Secondary | ICD-10-CM | POA: Diagnosis not present

## 2017-12-13 DIAGNOSIS — M25512 Pain in left shoulder: Secondary | ICD-10-CM | POA: Diagnosis not present

## 2017-12-16 DIAGNOSIS — M6281 Muscle weakness (generalized): Secondary | ICD-10-CM | POA: Diagnosis not present

## 2017-12-16 DIAGNOSIS — R2689 Other abnormalities of gait and mobility: Secondary | ICD-10-CM | POA: Diagnosis not present

## 2017-12-16 DIAGNOSIS — M546 Pain in thoracic spine: Secondary | ICD-10-CM | POA: Diagnosis not present

## 2017-12-16 DIAGNOSIS — M25512 Pain in left shoulder: Secondary | ICD-10-CM | POA: Diagnosis not present

## 2017-12-17 ENCOUNTER — Ambulatory Visit: Payer: Medicare Other | Admitting: Cardiology

## 2017-12-20 DIAGNOSIS — M546 Pain in thoracic spine: Secondary | ICD-10-CM | POA: Diagnosis not present

## 2017-12-20 DIAGNOSIS — R2689 Other abnormalities of gait and mobility: Secondary | ICD-10-CM | POA: Diagnosis not present

## 2017-12-20 DIAGNOSIS — M6281 Muscle weakness (generalized): Secondary | ICD-10-CM | POA: Diagnosis not present

## 2017-12-20 DIAGNOSIS — M25512 Pain in left shoulder: Secondary | ICD-10-CM | POA: Diagnosis not present

## 2017-12-22 DIAGNOSIS — M25512 Pain in left shoulder: Secondary | ICD-10-CM | POA: Diagnosis not present

## 2017-12-22 DIAGNOSIS — M546 Pain in thoracic spine: Secondary | ICD-10-CM | POA: Diagnosis not present

## 2017-12-22 DIAGNOSIS — M6281 Muscle weakness (generalized): Secondary | ICD-10-CM | POA: Diagnosis not present

## 2017-12-22 DIAGNOSIS — R2689 Other abnormalities of gait and mobility: Secondary | ICD-10-CM | POA: Diagnosis not present

## 2017-12-23 DIAGNOSIS — R350 Frequency of micturition: Secondary | ICD-10-CM | POA: Diagnosis not present

## 2017-12-23 DIAGNOSIS — N390 Urinary tract infection, site not specified: Secondary | ICD-10-CM | POA: Diagnosis not present

## 2017-12-23 DIAGNOSIS — I10 Essential (primary) hypertension: Secondary | ICD-10-CM | POA: Diagnosis not present

## 2017-12-27 DIAGNOSIS — R2689 Other abnormalities of gait and mobility: Secondary | ICD-10-CM | POA: Diagnosis not present

## 2017-12-27 DIAGNOSIS — M25512 Pain in left shoulder: Secondary | ICD-10-CM | POA: Diagnosis not present

## 2017-12-27 DIAGNOSIS — M546 Pain in thoracic spine: Secondary | ICD-10-CM | POA: Diagnosis not present

## 2017-12-27 DIAGNOSIS — M6281 Muscle weakness (generalized): Secondary | ICD-10-CM | POA: Diagnosis not present

## 2018-01-06 DIAGNOSIS — R350 Frequency of micturition: Secondary | ICD-10-CM | POA: Diagnosis not present

## 2018-01-06 DIAGNOSIS — B373 Candidiasis of vulva and vagina: Secondary | ICD-10-CM | POA: Diagnosis not present

## 2018-01-06 DIAGNOSIS — I10 Essential (primary) hypertension: Secondary | ICD-10-CM | POA: Diagnosis not present

## 2018-01-06 DIAGNOSIS — N3000 Acute cystitis without hematuria: Secondary | ICD-10-CM | POA: Diagnosis not present

## 2018-01-06 DIAGNOSIS — N390 Urinary tract infection, site not specified: Secondary | ICD-10-CM | POA: Diagnosis not present

## 2018-01-13 ENCOUNTER — Encounter: Payer: Self-pay | Admitting: Cardiology

## 2018-01-13 ENCOUNTER — Ambulatory Visit (INDEPENDENT_AMBULATORY_CARE_PROVIDER_SITE_OTHER): Payer: Medicare Other | Admitting: Cardiology

## 2018-01-13 VITALS — BP 119/70 | HR 54 | Wt 169.8 lb

## 2018-01-13 DIAGNOSIS — I10 Essential (primary) hypertension: Secondary | ICD-10-CM | POA: Diagnosis not present

## 2018-01-13 DIAGNOSIS — I350 Nonrheumatic aortic (valve) stenosis: Secondary | ICD-10-CM

## 2018-01-13 DIAGNOSIS — I482 Chronic atrial fibrillation: Secondary | ICD-10-CM | POA: Diagnosis not present

## 2018-01-13 DIAGNOSIS — E785 Hyperlipidemia, unspecified: Secondary | ICD-10-CM | POA: Diagnosis not present

## 2018-01-13 DIAGNOSIS — I4821 Permanent atrial fibrillation: Secondary | ICD-10-CM

## 2018-01-13 NOTE — Patient Instructions (Addendum)
NO CHANGE WITH MEDICATIONS   WATCH FOR ANY CHANGES WITH SYMPTOMS- SHORTNESS OF BREATHE , FATIGUE   Your physician wants you to follow-up in 12 MONTHS WITH DR HARDING.You will receive a reminder letter in the mail two months in advance. If you don't receive a letter, please call our office to schedule the follow-up appointment.  Will order 2D echo prior to this visit--     If you need a refill on your cardiac medications before your next appointment, please call your pharmacy.

## 2018-01-13 NOTE — Progress Notes (Signed)
PCP: Deland Pretty, MD  Clinic Note: Chief Complaint  Patient presents with  . Follow-up    12 months;  . Edema    in ankles.   . Atrial Fibrillation    HPI: Makayla Thomas is a 84 y.o. female with a PMH below who presents today for annual follow-up with chronic persistent atrial fibrillation and mild to moderate aortic stenosis.Makayla Thomas  Makayla Thomas was last seen in May 2018.  Doing relatively well without any major complaints.  Recent Hospitalizations: ER visit for a fall in November 2018  Studies Personally Reviewed - (if available, images/films reviewed: From Epic Chart or Care Everywhere)  No recent studies  She is accompanied today by 1 of her sons and his wife.  Interval History: Makayla Thomas returns here today overall feeling quite well.  She has not had a fall since November (and that occasion was when her legs gave out from under her having lost balance - they think this may have been related to her history of multiple sclerosis. Probably the only abnormal thing has happened over the last couple months that she had a nosebleed requiring some cautery.  Otherwise she really has no sensation whatsoever rapid irregular heartbeat/palpitations. She has no heart failure symptoms of PND,or orthopnea with mild end of day edema, but is no longer on any Lasix.  No chest pain or shortness of breath with rest or exertion -  However she really does not do that much limited by any significant activity.  She walks with a walker slowly. No syncope/near syncope, or TIA/amaurosis fugax symptoms. No melena, hematochezia, hematuria, or epstaxis. No claudication.  ROS: A comprehensive was performed. Review of Systems  Constitutional: Negative for malaise/fatigue.  HENT: Negative for congestion and nosebleeds.   Respiratory: Positive for shortness of breath (If she overdoes it).   Cardiovascular: Positive for leg swelling (Trivial).  Gastrointestinal: Positive for constipation. Negative for blood in  stool and diarrhea.  Genitourinary: Negative for hematuria.  Musculoskeletal: Negative for falls (No falls since November 2018).  Neurological: Positive for dizziness (Sometimes positional). Negative for focal weakness and weakness.  Endo/Heme/Allergies: Does not bruise/bleed easily.  Psychiatric/Behavioral: Negative.   All other systems reviewed and are negative.   I have reviewed and (if needed) personally updated the patient's problem list, medications, allergies, past medical and surgical history, social and family history.   Past Medical History:  Diagnosis Date  . Anemia   . Aortic stenosis, moderate (was mild) 08/2009; 08/2014   a. ECHO  EF 55-60%, wth increased EDP, mild AS;; b. mild Conc LVH, EF 60-65%, DD with high LVEDP, MIld-Mod AS, Mod MAC with Mod MR, mild-mod PA HTN.  Makayla Thomas Arthritis   . Borderline diabetes   . Chronic anticoagulation    on Xarelto  . Complication of anesthesia    hallusinations  . Constipation   . Diabetes mellitus without complication (Makayla Thomas)    possibly  . Dyslipidemia    treated  . Dysrhythmia    A-FIB  . H/O cardiovascular stress test 05/2010   Lexiscan cardiolite no ischemia or infarction  . H/O multiple sclerosis    no excaerbations in some time  . History of ETT 02/2011   Naughton exercise protocol, negative with poor exercise tolerance  . History of transfusion   . Hypertension   . Hypothyroidism   . Lower extremity edema    chronic  . PAD (peripheral artery disease) (Makayla Thomas) 08/30/2009   Dopplers; with bil. post. tib artery occlusion  . Permanent atrial  fibrillation (Makayla Thomas)   . Transient ischemic attack (TIA)    NO RESIDUAL PROBLEMS  . Vaginal itching   . Visual disturbances    "FLASHING IN MY EYES"    Past Surgical History:  Procedure Laterality Date  . BREAST SURGERY     L tumor removed - benign  . CESAREAN SECTION     hx of 3  . CHOLECYSTECTOMY    . JOINT REPLACEMENT  2012   left total knee  . ORIF PERIPROSTHETIC FRACTURE Right  02/07/2015   Procedure: OPEN REDUCTION INTERNAL FIXATION (ORIF) PERIPROSTHETIC FRACTURE WITH TOTAL HIP REVISION AND CABLES;  Surgeon: Paralee Cancel, MD;  Location: [facility];  Service: Orthopedics;  Laterality: Right;  . throidectomy partial     lt  . TOTAL HIP ARTHROPLASTY Right 01/29/2015   Procedure: RIGHT TOTAL HIP ARTHROPLASTY ANTERIOR APPROACH;  Surgeon: Paralee Cancel, MD;  Location: [facility];  Service: Orthopedics;  Laterality: Right;    Current Meds  Medication Sig  . bisoprolol (ZEBETA) 10 MG tablet Take 1 tablet (10 mg total) by mouth daily. BISOPROLOL 1/2 TABDAILY ,IF PALPITION INCREASE OK TO TAKE ANOTHER 1/2 TABLET. (Patient taking differently: Take 10-15 mg See admin instructions by mouth. 63m daily, may take as additional 530mif having palpitations.)  . CARTIA XT 120 MG 24 hr capsule TAKE ONE CAPSULE BY MOUTH AT BEDTIME  . cholecalciferol (VITAMIN D) 1000 UNITS tablet Take 1,000 Units by mouth every morning.   . Cranberry 500 MG TABS Take 500 mg daily by mouth.  . levothyroxine (SYNTHROID, LEVOTHROID) 125 MCG tablet Take 125 mcg by mouth daily before breakfast.  . Rivaroxaban (XARELTO) 15 MG TABS tablet Take 1 tablet (15 mg total) by mouth daily with breakfast. (Patient taking differently: Take 15 mg by mouth daily with supper. )  . triamterene-hydrochlorothiazide (MAXZIDE-25) 37.5-25 MG per tablet Take 0.5 tablets by mouth every morning.   . [DISCONTINUED] furosemide (LASIX) 20 MG tablet Take 10-20 mg by mouth every morning.     Allergies  Allergen Reactions  . Tetanus Antitoxin Anaphylaxis    Throat swelling  . Meclizine Other (See Comments)    Had funny feeling  . Tramadol Other (See Comments)    hallucinations    Social History   Tobacco Use  . Smoking status: Never Smoker  . Smokeless tobacco: Never Used  Substance Use Topics  . Alcohol use: No    Alcohol/week: 0.0 oz  . Drug use: No    Types: Fentanyl   Social History   Social History Narrative   Uses a walker  to walk   Drinks 1-2 cups of coffee a day     family history includes Heart failure (age of onset: 8445in her father; Lung cancer in her father.  Wt Readings from Last 3 Encounters:  01/13/18 169 lb 12.8 oz (77 kg)  06/27/17 178 lb (80.7 kg)  12/17/16 178 lb 3.2 oz (80.8 kg)    PHYSICAL EXAM BP 119/70   Pulse (!) 54   Wt 169 lb 12.8 oz (77 kg)   BMI 27.41 kg/m   Physical Exam  Constitutional: She is oriented to person, place, and time. She appears well-developed and well-nourished. No distress.  Actually appears younger than stated age.    HENT:  Head: Normocephalic and atraumatic.  Neck: No hepatojugular reflux and no JVD present. Carotid bruit is not present.  Cardiovascular: S1 normal, S2 normal, intact distal pulses and normal pulses. An irregularly irregular rhythm present. Bradycardia present. PMI is not  displaced. Exam reveals no gallop and no friction rub.  Murmur heard.  Harsh crescendo-decrescendo midsystolic murmur is present with a grade of 1/6 at the upper right sternal border radiating to the neck. Pulmonary/Chest: Effort normal and breath sounds normal. No respiratory distress. She has no wheezes. She has no rales.  Abdominal: Soft. Bowel sounds are normal. She exhibits no distension. There is no tenderness.  No HSM  Musculoskeletal: Normal range of motion. She exhibits edema (Trivial).  Neurological: She is alert and oriented to person, place, and time.  Psychiatric: She has a normal mood and affect. Her behavior is normal. Judgment and thought content normal.  Vitals reviewed.    Adult ECG Report  Rate: 54;  Rhythm: atrial fibrillation and Slow ventricular rate.  Otherwise normal axis, intervals and durations.;   Narrative Interpretation: Stable EKG   Other studies Reviewed: Additional studies/ records that were reviewed today include:  Recent Labs:    November 2018: Total cholesterol 210, HDL 55, LDL 127, triglycerides 138.  A1c 6.1 with a glucose 104.   BUN/creatinine 23/1.3.  TSH 2.66.  ASSESSMENT / PLAN: Problem List Items Addressed This Visit    Permanent atrial fibrillation ([place]); CHA2DS2-VASc Score 6 - Primary (Chronic)    Rate controlled, asymptomatic.  If she does show signs of fatigue, we would probably want to back down on either beta-blocker or Cartia.  She is on low-dose of both.    she does have the ability to take PRN additional beta-blocker. She is on Xarelto reduced dose without any bleeding.      Relevant Orders   EKG 12-Lead (Completed)   Moderate aortic stenosis (Chronic)    She had a history of moderate aortic stenosis by previous echoes.  She is relatively asymptomatic, and the plan is to follow-up an echocardiogram to be ordered next year.  She would not be interested in any type of surgical repair, but may be interested in TAVR      Relevant Orders   EKG 12-Lead (Completed)   Essential hypertension -well controlled (Chronic)    Well-controlled blood pressure.  She is on combination of bisoprolol and Cartia  in addition to [facility]      Relevant Orders   EKG 12-Lead (Completed)   Dyslipidemia, goal LDL below 100 - due aortic stenosis (Chronic)    Managed by PCP.  Not on statin.Makayla Thomas Her LDL is 127 in November.  She probably would not take any medication.  Discussed adjusting her diet           I spent a total of 25 minutes with the patient and chart review. >  50% of the time was spent in direct patient consultation.   Current medicines are reviewed at length with the patient today.  (+/- concerns) n/a The following changes have been made:  n/a  Patient Instructions  NO CHANGE WITH MEDICATIONS   WATCH FOR ANY CHANGES WITH SYMPTOMS- [place] , FATIGUE   Your physician wants you to follow-up in [place].You will receive a reminder letter in the mail two months in advance. If you don't receive a letter, please call our office to schedule the follow-up appointment.  Will order  2D echo prior to this visit--     If you need a refill on your cardiac medications before your next appointment, please call your pharmacy.     Studies Ordered:   Orders Placed This Encounter  Procedures  . EKG 12-Lead  Glenetta Hew, M.D., M.S. Interventional Cardiologist   Pager # [phone] Phone # [phone] [address] [place], [place] [address]   Thank you for choosing [facility] at [facility]!!

## 2018-01-16 ENCOUNTER — Encounter: Payer: Self-pay | Admitting: Cardiology

## 2018-01-16 NOTE — Assessment & Plan Note (Signed)
Well-controlled blood pressure.  She is on combination of bisoprolol and Cartia  in addition to [facility]

## 2018-01-16 NOTE — Assessment & Plan Note (Signed)
Managed by PCP.  Not on statin.[name] Her LDL is 127 in November.  She probably would not take any medication.  Discussed adjusting her diet

## 2018-01-16 NOTE — Assessment & Plan Note (Signed)
Rate controlled, asymptomatic.  If she does show signs of fatigue, we would probably want to back down on either beta-blocker or Cartia.  She is on low-dose of both.    she does have the ability to take PRN additional beta-blocker. She is on Xarelto reduced dose without any bleeding.

## 2018-01-16 NOTE — Assessment & Plan Note (Signed)
She had a history of moderate aortic stenosis by previous echoes.  She is relatively asymptomatic, and the plan is to follow-up an echocardiogram to be ordered next year.  She would not be interested in any type of surgical repair, but may be interested in TAVR

## 2018-01-18 ENCOUNTER — Telehealth: Payer: Self-pay | Admitting: *Deleted

## 2018-01-18 DIAGNOSIS — I350 Nonrheumatic aortic (valve) stenosis: Secondary | ICD-10-CM

## 2018-01-18 NOTE — Telephone Encounter (Signed)
Per DR Herbie Baltimore, patient will need  annual echo prior to annual appointment 12/2018. Information given to  Patient's son Brett Canales.  order placed

## 2018-01-27 ENCOUNTER — Telehealth: Payer: Self-pay | Admitting: Cardiology

## 2018-01-27 NOTE — Telephone Encounter (Signed)
Pt son is calling and need to speak to nurse concerning how pt take all her pills. Please call son to advise.

## 2018-01-27 NOTE — Telephone Encounter (Signed)
Left message to call back  

## 2018-01-28 NOTE — Telephone Encounter (Signed)
Spoke with pt son, questions regarding evening or morning dosing of medications answered.

## 2018-02-01 ENCOUNTER — Telehealth: Payer: Self-pay | Admitting: Cardiology

## 2018-02-01 ENCOUNTER — Other Ambulatory Visit: Payer: Self-pay | Admitting: *Deleted

## 2018-02-01 NOTE — Telephone Encounter (Signed)
New Message   Pt's son is calling wanting to delete the [facility] pharmacy that is saved on file. States the pt is using [facility]

## 2018-02-01 NOTE — Telephone Encounter (Signed)
[facility] pharmacy deleted from file at sons request .Zack Seal

## 2018-02-08 DIAGNOSIS — N952 Postmenopausal atrophic vaginitis: Secondary | ICD-10-CM | POA: Diagnosis not present

## 2018-02-08 DIAGNOSIS — N390 Urinary tract infection, site not specified: Secondary | ICD-10-CM | POA: Diagnosis not present

## 2018-02-08 DIAGNOSIS — N3941 Urge incontinence: Secondary | ICD-10-CM | POA: Diagnosis not present

## 2018-03-07 DIAGNOSIS — N390 Urinary tract infection, site not specified: Secondary | ICD-10-CM | POA: Diagnosis not present

## 2018-03-07 DIAGNOSIS — B964 Proteus (mirabilis) (morganii) as the cause of diseases classified elsewhere: Secondary | ICD-10-CM | POA: Diagnosis not present

## 2018-03-07 DIAGNOSIS — N3941 Urge incontinence: Secondary | ICD-10-CM | POA: Diagnosis not present

## 2018-03-07 DIAGNOSIS — N3946 Mixed incontinence: Secondary | ICD-10-CM | POA: Diagnosis not present

## 2018-03-07 DIAGNOSIS — R82998 Other abnormal findings in urine: Secondary | ICD-10-CM | POA: Diagnosis not present

## 2018-03-10 DIAGNOSIS — B964 Proteus (mirabilis) (morganii) as the cause of diseases classified elsewhere: Secondary | ICD-10-CM | POA: Diagnosis not present

## 2018-03-10 DIAGNOSIS — N390 Urinary tract infection, site not specified: Secondary | ICD-10-CM | POA: Diagnosis not present

## 2018-03-11 ENCOUNTER — Emergency Department (HOSPITAL_COMMUNITY): Payer: Medicare Other

## 2018-03-11 ENCOUNTER — Emergency Department (HOSPITAL_COMMUNITY)
Admission: EM | Admit: 2018-03-11 | Discharge: 2018-03-11 | Disposition: A | Discharge status: Home / Self Care | Payer: Medicare Other | Attending: Emergency Medicine | Admitting: Emergency Medicine

## 2018-03-11 ENCOUNTER — Encounter (HOSPITAL_COMMUNITY): Payer: Self-pay

## 2018-03-11 ENCOUNTER — Other Ambulatory Visit: Payer: Self-pay

## 2018-03-11 DIAGNOSIS — S2232XA Fracture of one rib, left side, initial encounter for closed fracture: Secondary | ICD-10-CM | POA: Diagnosis not present

## 2018-03-11 DIAGNOSIS — Z96641 Presence of right artificial hip joint: Secondary | ICD-10-CM | POA: Diagnosis not present

## 2018-03-11 DIAGNOSIS — E039 Hypothyroidism, unspecified: Secondary | ICD-10-CM | POA: Insufficient documentation

## 2018-03-11 DIAGNOSIS — Y929 Unspecified place or not applicable: Secondary | ICD-10-CM | POA: Insufficient documentation

## 2018-03-11 DIAGNOSIS — Y999 Unspecified external cause status: Secondary | ICD-10-CM | POA: Diagnosis not present

## 2018-03-11 DIAGNOSIS — R102 Pelvic and perineal pain: Secondary | ICD-10-CM | POA: Diagnosis not present

## 2018-03-11 DIAGNOSIS — M25561 Pain in right knee: Secondary | ICD-10-CM | POA: Diagnosis not present

## 2018-03-11 DIAGNOSIS — S0990XA Unspecified injury of head, initial encounter: Secondary | ICD-10-CM | POA: Diagnosis not present

## 2018-03-11 DIAGNOSIS — W010XXA Fall on same level from slipping, tripping and stumbling without subsequent striking against object, initial encounter: Secondary | ICD-10-CM | POA: Insufficient documentation

## 2018-03-11 DIAGNOSIS — E119 Type 2 diabetes mellitus without complications: Secondary | ICD-10-CM | POA: Insufficient documentation

## 2018-03-11 DIAGNOSIS — Y9389 Activity, other specified: Secondary | ICD-10-CM | POA: Diagnosis not present

## 2018-03-11 DIAGNOSIS — Z7901 Long term (current) use of anticoagulants: Secondary | ICD-10-CM | POA: Insufficient documentation

## 2018-03-11 DIAGNOSIS — I10 Essential (primary) hypertension: Secondary | ICD-10-CM | POA: Diagnosis not present

## 2018-03-11 DIAGNOSIS — S3993XA Unspecified injury of pelvis, initial encounter: Secondary | ICD-10-CM | POA: Diagnosis not present

## 2018-03-11 DIAGNOSIS — Z96652 Presence of left artificial knee joint: Secondary | ICD-10-CM | POA: Diagnosis not present

## 2018-03-11 DIAGNOSIS — W19XXXA Unspecified fall, initial encounter: Secondary | ICD-10-CM

## 2018-03-11 DIAGNOSIS — Z79899 Other long term (current) drug therapy: Secondary | ICD-10-CM | POA: Diagnosis not present

## 2018-03-11 DIAGNOSIS — Z8673 Personal history of transient ischemic attack (TIA), and cerebral infarction without residual deficits: Secondary | ICD-10-CM | POA: Insufficient documentation

## 2018-03-11 DIAGNOSIS — S79919A Unspecified injury of unspecified hip, initial encounter: Secondary | ICD-10-CM | POA: Diagnosis not present

## 2018-03-11 DIAGNOSIS — S299XXA Unspecified injury of thorax, initial encounter: Secondary | ICD-10-CM | POA: Diagnosis present

## 2018-03-11 DIAGNOSIS — R079 Chest pain, unspecified: Secondary | ICD-10-CM | POA: Diagnosis not present

## 2018-03-11 NOTE — Discharge Instructions (Addendum)
You were evaluated in the emergency department for injuries after a fall.  Your head was normal but on chest x-ray you had evidence of a rib fracture on the left.  You also had a knee x-ray on the right that did not show an acute fracture.  These can be very painful but ultimately have to heal on their own.  You are on blood thinners and also at risk for significant bleeding.  Please watch out for any increased shortness of breath weakness dizziness fever or other concerns and please return to the hospital.  Follow up with your doctor.  You are having difficulty ambulating here and so we are arranging for you to get a wheelchair.

## 2018-03-11 NOTE — ED Provider Notes (Signed)
[facility] EMERGENCY DEPARTMENT Provider Note   CSN: [id number] Arrival date & time: 03/11/18  1019     History   Chief Complaint Chief Complaint  Patient presents with  . Hip Pain  . Fall    HPI Makayla Thomas is a 84 y.o. female.  She has a history of aortic stenosis and A. fib and is on Xarelto.  She had a mechanical fall when her knee gave out as she was walking with her walker.  She ended up on the floor without any loss of consciousness.  But she does not think she had her head but she is not sure.  She is complaining of some left-sided rib pain.  It is worse with twisting turning or taking a deep breath.  She initially said it was left hip pain but can range of motion at her hip without any difficulty.  There is no numbness no tingling.  She has chronic right knee pain and needs an operation but she was told with her medical problems that she was going to get one.  There is no headache no neck pain no back pain no numbness no weakness  The history is provided by the patient.  Fall  This is a new problem. The current episode started 1 to 2 hours ago. The problem has not changed since onset.Associated symptoms include chest pain. Pertinent negatives include no abdominal pain, no headaches and no shortness of breath. The symptoms are aggravated by twisting and bending. Nothing relieves the symptoms. She has tried nothing for the symptoms. The treatment provided no relief.    Past Medical History:  Diagnosis Date  . Anemia   . Aortic stenosis, moderate (was mild) 08/2009; 08/2014   a. ECHO  EF 55-60%, wth increased EDP, mild AS;; b. mild Conc LVH, EF 60-65%, DD with high LVEDP, MIld-Mod AS, Mod MAC with Mod MR, mild-mod PA HTN.  [name] Arthritis   . Borderline diabetes   . Chronic anticoagulation    on Xarelto  . Complication of anesthesia    hallusinations  . Constipation   . Diabetes mellitus without complication ([place])    possibly  . Dyslipidemia    treated    . Dysrhythmia    A-FIB  . H/O cardiovascular stress test 05/2010   Lexiscan cardiolite no ischemia or infarction  . H/O multiple sclerosis    no excaerbations in some time  . History of ETT 02/2011   Naughton exercise protocol, negative with poor exercise tolerance  . History of transfusion   . Hypertension   . Hypothyroidism   . Lower extremity edema    chronic  . PAD (peripheral artery disease) ([place]) 08/30/2009   Dopplers; with bil. post. tib artery occlusion  . Permanent atrial fibrillation ([place])   . Transient ischemic attack (TIA)    NO RESIDUAL PROBLEMS  . Vaginal itching   . Visual disturbances    "FLASHING IN MY EYES"    Patient Active Problem List   Diagnosis Date Noted  . Periprosthetic fracture around internal prosthetic right hip joint ([place]) 02/05/2015  . Hypothyroidism 02/05/2015  . Obese 01/30/2015  . S/P right THA, AA 01/29/2015  . Rectal bleeding   . Anticoagulation adequate   . Stroke ([place])   . Dehydration 08/29/2014  . Nausea and vomiting 08/29/2014  . Diarrhea 08/29/2014  . Hypokalemia 08/29/2014  . Acute renal failure ([place]) 08/29/2014  . Leukocytosis 08/29/2014  . History of TIA (transient ischemic attack) 08/18/2014  . Essential  hypertension -well controlled 01/10/2013  . Permanent atrial fibrillation ([place]); CHA2DS2-VASc Score 6   . Chronic anticoagulation   . Dyslipidemia, goal LDL below 100 - due aortic stenosis   . Moderate aortic stenosis 08/17/2009    Past Surgical History:  Procedure Laterality Date  . BREAST SURGERY     L tumor removed - benign  . CESAREAN SECTION     hx of 3  . CHOLECYSTECTOMY    . JOINT REPLACEMENT  2012   left total knee  . ORIF PERIPROSTHETIC FRACTURE Right 02/07/2015   Procedure: OPEN REDUCTION INTERNAL FIXATION (ORIF) PERIPROSTHETIC FRACTURE WITH TOTAL HIP REVISION AND CABLES;  Surgeon: Paralee Cancel, MD;  Location: [facility];  Service: Orthopedics;  Laterality: Right;  . throidectomy partial     lt  . TOTAL HIP  ARTHROPLASTY Right 01/29/2015   Procedure: RIGHT TOTAL HIP ARTHROPLASTY ANTERIOR APPROACH;  Surgeon: Paralee Cancel, MD;  Location: [facility];  Service: Orthopedics;  Laterality: Right;     OB History   None      Home Medications    Prior to Admission medications   Medication Sig Start Date End Date Taking? Authorizing Provider  bisoprolol (ZEBETA) 10 MG tablet Take 1 tablet (10 mg total) by mouth daily. BISOPROLOL 1/2 TABDAILY ,IF PALPITION INCREASE OK TO TAKE ANOTHER 1/2 TABLET. Patient taking differently: Take 10-15 mg See admin instructions by mouth. 51m daily, may take as additional 598mif having palpitations. 02/23/17   HaLeonie ManMD  CARTIA XT 120 MG 24 hr capsule TAKE ONE CAPSULE BY MOUTH AT BEDTIME 01/22/17   HaLeonie ManMD  cholecalciferol (VITAMIN D) 1000 UNITS tablet Take 1,000 Units by mouth every morning.     [provider]  Cranberry 500 MG TABS Take 500 mg daily by mouth.    [provider]  levothyroxine (SYNTHROID, LEVOTHROID) 125 MCG tablet Take 125 mcg by mouth daily before breakfast.    [provider]  MYRBETRIQ 25 MG TB24 tablet Take 25 mg by mouth daily. 12/15/17   [provider]  Rivaroxaban (XARELTO) 15 MG TABS tablet Take 1 tablet (15 mg total) by mouth daily with breakfast. Patient taking differently: Take 15 mg by mouth daily with supper.  02/12/15   AkHosie PoissonMD  triamterene-hydrochlorothiazide (MAXZIDE-25) 37.5-25 MG per tablet Take 0.5 tablets by mouth every morning.     [provider]  trimethoprim (TRIMPEX) 100 MG tablet TAKE 1 TABLET BY MOUTH TWICE A DAY FOR 5 DAYS 01/06/18   [provider]    Family History Family History  Problem Relation Age of Onset  . Heart failure Father 8421. Lung cancer Father     Social History Social History   Tobacco Use  . Smoking status: Never Smoker  . Smokeless tobacco: Never Used  Substance Use Topics  . Alcohol use: No    Alcohol/week: 0.0 oz    . Drug use: No    Types: Fentanyl     Allergies   Tetanus antitoxin; Meclizine; and Tramadol   Review of Systems Review of Systems  Constitutional: Negative for fever.  HENT: Negative for sore throat.   Eyes: Negative for visual disturbance.  Respiratory: Negative for shortness of breath.   Cardiovascular: Positive for chest pain.  Gastrointestinal: Negative for abdominal pain.  Genitourinary: Negative for dysuria.  Musculoskeletal: Negative for back pain and neck pain.  Skin: Negative for rash.  Neurological: Negative for headaches.     Physical Exam Updated Vital Signs  BP (!) 132/97   Pulse 60   Temp 98.1 F (36.7 C) (Oral)   Resp 18   Ht _0  (1.549 m)   Wt 74.4 kg (164 lb)   SpO2 96%   BMI 30.99 kg/m   Physical Exam  Constitutional: She appears well-developed and well-nourished.  HENT:  Head: Normocephalic and atraumatic.  Eyes: Conjunctivae are normal.  Neck: Neck supple.  Cardiovascular: Normal rate, normal heart sounds and intact distal pulses.  Pulmonary/Chest: Effort normal. No stridor. She has no wheezes. She has no rales. She exhibits tenderness (left lateral - no crepitus).  Abdominal: Soft. She exhibits no mass. There is no tenderness. There is no guarding.  Musculoskeletal: Normal range of motion.  She has some chronic pain and limitation in her right knee.  Otherwise full range of motion of all joints.  Specifically no apparent tenderness in her left hip.  Neurological: She is alert. She has normal strength. No sensory deficit. GCS eye subscore is 4. GCS verbal subscore is 5. GCS motor subscore is 6.  Skin: Skin is warm and dry.  Psychiatric: She has a normal mood and affect.     ED Treatments / Results  Labs (all labs ordered are listed, but only abnormal results are displayed) Labs Reviewed - No data to display  EKG None  Radiology No results found.  Procedures Procedures (including critical care time)  Medications Ordered in  ED Medications - No data to display     Durable Medical Equipment  (From admission, onward)        Start     Ordered   03/11/18 1327  For home use only DME standard manual wheelchair with seat cushion  Once    Comments:  Patient suffers from knee pain which impairs their ability to perform daily activities like bathing in the home.  A walker will not resolve  issue with performing activities of daily living. A wheelchair will allow patient to safely perform daily activities. Patient can safely propel the wheelchair in the home or has a caregiver who can provide assistance.  Accessories: elevating leg rests (ELRs), wheel locks, extensions and anti-tippers.   03/11/18 1327     Initial Impression / Assessment and Plan / ED Course  I have reviewed the triage vital signs and the nursing notes.  Pertinent labs & imaging results that were available during my care of the patient were reviewed by me and considered in my medical decision making (see chart for details).  Clinical Course as of Mar 11 1326  Fri Mar 11, 2018  1200 Reviewed the patient's imaging with her.  She has a rib fracture on the left which fits with her clinical exam.  No obvious effusion in the rest of her imaging was negative.  She is comfortable going home and following up with her primary care doctor.   [MB]  1222 Patient asked to be evaluated again before leaving because she noticed so much her right knee was hurting her.  She has chronic right knee issues and has been told that she would need a surgery but was not medically well enough to get one.  She is diffusely tender but no particular swelling or erythema.  Order an x-ray of her right knee.   [MB]    Clinical Course User Index [MB] Hayden Rasmussen, MD     Final Clinical Impressions(s) / ED Diagnoses   Final diagnoses:  None    ED Discharge Orders    None  Hayden Rasmussen, MD 03/11/18 1328

## 2018-03-11 NOTE — ED Notes (Signed)
Pt has received wheel chair at this time

## 2018-03-11 NOTE — ED Triage Notes (Signed)
Pt BIBA for c/o fall ; pt states she was trying to get to the phone and states her knees gave out and ended up falling face forward onto carpet ; pt denies any dizziness piror to fall; no KO ; No trauma noted to the face ; pt denies any headache ; pt c/o left hip pain and left rib pain ; Per EMS No shortening or bruising or swelling  noted to the left hip area; pt on xarelto

## 2018-03-11 NOTE — Care Management Note (Signed)
Case Management Note  Patient Details  Name: Makayla Thomas MRN: [id number] Date of Birth: 08/27/1925  CM contacted by CSW requesting a wheelchair.  Orders placed by Dr. Charm Barges.  CM contacted Lupita Leash with [facility] who will bring wheelchair down to pt prior to leaving the ED. No further CM needs noted at this time.  Expected Discharge Date:   03/11/2018               Expected Discharge Plan:  Home/Self Care  In-House Referral:  Clinical Social Work  Discharge planning Services  CM Consult  Post Acute Care Choice:  Durable Medical Equipment  DME Arranged:  Wheelchair manual DME Agency:  [facility]  Status of Service:  Completed, signed off  Makayla Thomas, Makayla Sandhoff, RN 03/11/2018, 1:31 PM

## 2018-03-21 DIAGNOSIS — K5900 Constipation, unspecified: Secondary | ICD-10-CM | POA: Diagnosis not present

## 2018-03-21 DIAGNOSIS — N39498 Other specified urinary incontinence: Secondary | ICD-10-CM | POA: Diagnosis not present

## 2018-04-06 ENCOUNTER — Telehealth: Payer: Self-pay | Admitting: Cardiology

## 2018-04-06 NOTE — Telephone Encounter (Signed)
Called son, LVM advising to call back to discuss medications.  Left call back number.

## 2018-04-06 NOTE — Telephone Encounter (Signed)
New message  Pt c/o medication issue:  1. Name of Medication: CARTIA XT 120 MG 24 hr capsule and bisoprolol (ZEBETA) 10 MG tablet  2. How are you currently taking this medication (dosage and times per day)? bisoprolol in the morning 1 time daily, Cardia XT at night 1 time a day.    3. Are you having a reaction (difficulty breathing--STAT)? No    4. What is your medication issue? Patient's son wants to know if one medications can be dropped. Patient's son states that this was discussed at last appt. Patient's son states that she took an extra dose of the medications within the last 24 hours. Please advise.

## 2018-04-07 NOTE — Telephone Encounter (Signed)
Left message for pt son to call  

## 2018-04-08 NOTE — Telephone Encounter (Signed)
Attempted to contact son x 3. Unable to leave message.

## 2018-04-13 ENCOUNTER — Encounter (HOSPITAL_COMMUNITY): Payer: Self-pay

## 2018-04-13 ENCOUNTER — Other Ambulatory Visit: Payer: Self-pay

## 2018-04-13 ENCOUNTER — Emergency Department (HOSPITAL_COMMUNITY): Payer: Medicare Other

## 2018-04-13 ENCOUNTER — Telehealth: Payer: Self-pay | Admitting: Cardiology

## 2018-04-13 ENCOUNTER — Inpatient Hospital Stay (HOSPITAL_COMMUNITY)
Admission: EM | Admit: 2018-04-13 | Discharge: 2018-04-20 | DRG: 481 | Disposition: A | Discharge status: Skilled Nursing Facility | Payer: Medicare Other | Attending: Internal Medicine | Admitting: Internal Medicine

## 2018-04-13 DIAGNOSIS — E1122 Type 2 diabetes mellitus with diabetic chronic kidney disease: Secondary | ICD-10-CM | POA: Diagnosis present

## 2018-04-13 DIAGNOSIS — S72402A Unspecified fracture of lower end of left femur, initial encounter for closed fracture: Secondary | ICD-10-CM | POA: Diagnosis not present

## 2018-04-13 DIAGNOSIS — Z881 Allergy status to other antibiotic agents status: Secondary | ICD-10-CM

## 2018-04-13 DIAGNOSIS — R630 Anorexia: Secondary | ICD-10-CM | POA: Diagnosis present

## 2018-04-13 DIAGNOSIS — S72492D Other fracture of lower end of left femur, subsequent encounter for closed fracture with routine healing: Secondary | ICD-10-CM | POA: Diagnosis not present

## 2018-04-13 DIAGNOSIS — R404 Transient alteration of awareness: Secondary | ICD-10-CM | POA: Diagnosis not present

## 2018-04-13 DIAGNOSIS — Z79890 Hormone replacement therapy (postmenopausal): Secondary | ICD-10-CM

## 2018-04-13 DIAGNOSIS — S79911A Unspecified injury of right hip, initial encounter: Secondary | ICD-10-CM | POA: Diagnosis not present

## 2018-04-13 DIAGNOSIS — R339 Retention of urine, unspecified: Secondary | ICD-10-CM | POA: Diagnosis not present

## 2018-04-13 DIAGNOSIS — R739 Hyperglycemia, unspecified: Secondary | ICD-10-CM | POA: Diagnosis present

## 2018-04-13 DIAGNOSIS — Z743 Need for continuous supervision: Secondary | ICD-10-CM | POA: Diagnosis not present

## 2018-04-13 DIAGNOSIS — M978XXA Periprosthetic fracture around other internal prosthetic joint, initial encounter: Secondary | ICD-10-CM | POA: Diagnosis not present

## 2018-04-13 DIAGNOSIS — I129 Hypertensive chronic kidney disease with stage 1 through stage 4 chronic kidney disease, or unspecified chronic kidney disease: Secondary | ICD-10-CM | POA: Diagnosis present

## 2018-04-13 DIAGNOSIS — N183 Chronic kidney disease, stage 3 unspecified: Secondary | ICD-10-CM | POA: Diagnosis present

## 2018-04-13 DIAGNOSIS — I10 Essential (primary) hypertension: Secondary | ICD-10-CM

## 2018-04-13 DIAGNOSIS — Z9049 Acquired absence of other specified parts of digestive tract: Secondary | ICD-10-CM | POA: Diagnosis not present

## 2018-04-13 DIAGNOSIS — S82852D Displaced trimalleolar fracture of left lower leg, subsequent encounter for closed fracture with routine healing: Secondary | ICD-10-CM | POA: Diagnosis not present

## 2018-04-13 DIAGNOSIS — R627 Adult failure to thrive: Secondary | ICD-10-CM | POA: Diagnosis present

## 2018-04-13 DIAGNOSIS — Z7901 Long term (current) use of anticoagulants: Secondary | ICD-10-CM

## 2018-04-13 DIAGNOSIS — I350 Nonrheumatic aortic (valve) stenosis: Secondary | ICD-10-CM | POA: Diagnosis not present

## 2018-04-13 DIAGNOSIS — S82842A Displaced bimalleolar fracture of left lower leg, initial encounter for closed fracture: Secondary | ICD-10-CM | POA: Diagnosis not present

## 2018-04-13 DIAGNOSIS — R600 Localized edema: Secondary | ICD-10-CM | POA: Diagnosis present

## 2018-04-13 DIAGNOSIS — Z888 Allergy status to other drugs, medicaments and biological substances status: Secondary | ICD-10-CM

## 2018-04-13 DIAGNOSIS — E1165 Type 2 diabetes mellitus with hyperglycemia: Secondary | ICD-10-CM | POA: Diagnosis present

## 2018-04-13 DIAGNOSIS — R410 Disorientation, unspecified: Secondary | ICD-10-CM | POA: Diagnosis not present

## 2018-04-13 DIAGNOSIS — M9712XA Periprosthetic fracture around internal prosthetic left knee joint, initial encounter: Principal | ICD-10-CM | POA: Diagnosis present

## 2018-04-13 DIAGNOSIS — Z96642 Presence of left artificial hip joint: Secondary | ICD-10-CM | POA: Diagnosis not present

## 2018-04-13 DIAGNOSIS — M810 Age-related osteoporosis without current pathological fracture: Secondary | ICD-10-CM | POA: Diagnosis present

## 2018-04-13 DIAGNOSIS — W1830XA Fall on same level, unspecified, initial encounter: Secondary | ICD-10-CM | POA: Diagnosis present

## 2018-04-13 DIAGNOSIS — D62 Acute posthemorrhagic anemia: Secondary | ICD-10-CM | POA: Diagnosis not present

## 2018-04-13 DIAGNOSIS — M79605 Pain in left leg: Secondary | ICD-10-CM | POA: Diagnosis not present

## 2018-04-13 DIAGNOSIS — Z8249 Family history of ischemic heart disease and other diseases of the circulatory system: Secondary | ICD-10-CM

## 2018-04-13 DIAGNOSIS — Z66 Do not resuscitate: Secondary | ICD-10-CM | POA: Diagnosis present

## 2018-04-13 DIAGNOSIS — I4821 Permanent atrial fibrillation: Secondary | ICD-10-CM

## 2018-04-13 DIAGNOSIS — Z96652 Presence of left artificial knee joint: Secondary | ICD-10-CM | POA: Diagnosis not present

## 2018-04-13 DIAGNOSIS — F0390 Unspecified dementia without behavioral disturbance: Secondary | ICD-10-CM | POA: Diagnosis present

## 2018-04-13 DIAGNOSIS — S0990XA Unspecified injury of head, initial encounter: Secondary | ICD-10-CM | POA: Diagnosis not present

## 2018-04-13 DIAGNOSIS — L89321 Pressure ulcer of left buttock, stage 1: Secondary | ICD-10-CM | POA: Diagnosis present

## 2018-04-13 DIAGNOSIS — S8292XD Unspecified fracture of left lower leg, subsequent encounter for closed fracture with routine healing: Secondary | ICD-10-CM | POA: Diagnosis not present

## 2018-04-13 DIAGNOSIS — I482 Chronic atrial fibrillation, unspecified: Secondary | ICD-10-CM | POA: Diagnosis not present

## 2018-04-13 DIAGNOSIS — Q899 Congenital malformation, unspecified: Secondary | ICD-10-CM

## 2018-04-13 DIAGNOSIS — Z419 Encounter for procedure for purposes other than remedying health state, unspecified: Secondary | ICD-10-CM

## 2018-04-13 DIAGNOSIS — M25552 Pain in left hip: Secondary | ICD-10-CM | POA: Diagnosis not present

## 2018-04-13 DIAGNOSIS — R0902 Hypoxemia: Secondary | ICD-10-CM | POA: Diagnosis not present

## 2018-04-13 DIAGNOSIS — G35 Multiple sclerosis: Secondary | ICD-10-CM | POA: Diagnosis present

## 2018-04-13 DIAGNOSIS — S199XXA Unspecified injury of neck, initial encounter: Secondary | ICD-10-CM | POA: Diagnosis not present

## 2018-04-13 DIAGNOSIS — Z01810 Encounter for preprocedural cardiovascular examination: Secondary | ICD-10-CM | POA: Diagnosis not present

## 2018-04-13 DIAGNOSIS — R52 Pain, unspecified: Secondary | ICD-10-CM

## 2018-04-13 DIAGNOSIS — Z8673 Personal history of transient ischemic attack (TIA), and cerebral infarction without residual deficits: Secondary | ICD-10-CM

## 2018-04-13 DIAGNOSIS — Z79899 Other long term (current) drug therapy: Secondary | ICD-10-CM

## 2018-04-13 DIAGNOSIS — E876 Hypokalemia: Secondary | ICD-10-CM | POA: Diagnosis not present

## 2018-04-13 DIAGNOSIS — E785 Hyperlipidemia, unspecified: Secondary | ICD-10-CM | POA: Diagnosis present

## 2018-04-13 DIAGNOSIS — Z96649 Presence of unspecified artificial hip joint: Secondary | ICD-10-CM | POA: Diagnosis not present

## 2018-04-13 DIAGNOSIS — E039 Hypothyroidism, unspecified: Secondary | ICD-10-CM | POA: Diagnosis not present

## 2018-04-13 DIAGNOSIS — I4891 Unspecified atrial fibrillation: Secondary | ICD-10-CM | POA: Diagnosis not present

## 2018-04-13 DIAGNOSIS — S72452A Displaced supracondylar fracture without intracondylar extension of lower end of left femur, initial encounter for closed fracture: Secondary | ICD-10-CM | POA: Diagnosis present

## 2018-04-13 DIAGNOSIS — S79912A Unspecified injury of left hip, initial encounter: Secondary | ICD-10-CM | POA: Diagnosis not present

## 2018-04-13 DIAGNOSIS — Z9181 History of falling: Secondary | ICD-10-CM | POA: Diagnosis not present

## 2018-04-13 DIAGNOSIS — T148XXA Other injury of unspecified body region, initial encounter: Secondary | ICD-10-CM

## 2018-04-13 DIAGNOSIS — S8252XA Displaced fracture of medial malleolus of left tibia, initial encounter for closed fracture: Secondary | ICD-10-CM | POA: Diagnosis not present

## 2018-04-13 DIAGNOSIS — R279 Unspecified lack of coordination: Secondary | ICD-10-CM | POA: Diagnosis not present

## 2018-04-13 DIAGNOSIS — E1151 Type 2 diabetes mellitus with diabetic peripheral angiopathy without gangrene: Secondary | ICD-10-CM | POA: Diagnosis present

## 2018-04-13 DIAGNOSIS — N390 Urinary tract infection, site not specified: Secondary | ICD-10-CM | POA: Diagnosis not present

## 2018-04-13 DIAGNOSIS — S93432A Sprain of tibiofibular ligament of left ankle, initial encounter: Secondary | ICD-10-CM | POA: Diagnosis not present

## 2018-04-13 DIAGNOSIS — S8992XA Unspecified injury of left lower leg, initial encounter: Secondary | ICD-10-CM | POA: Diagnosis not present

## 2018-04-13 DIAGNOSIS — S82852A Displaced trimalleolar fracture of left lower leg, initial encounter for closed fracture: Secondary | ICD-10-CM | POA: Diagnosis present

## 2018-04-13 DIAGNOSIS — W19XXXA Unspecified fall, initial encounter: Secondary | ICD-10-CM

## 2018-04-13 DIAGNOSIS — Z96641 Presence of right artificial hip joint: Secondary | ICD-10-CM | POA: Diagnosis present

## 2018-04-13 DIAGNOSIS — M21962 Unspecified acquired deformity of left lower leg: Secondary | ICD-10-CM | POA: Diagnosis not present

## 2018-04-13 DIAGNOSIS — Z885 Allergy status to narcotic agent status: Secondary | ICD-10-CM

## 2018-04-13 DIAGNOSIS — S72402D Unspecified fracture of lower end of left femur, subsequent encounter for closed fracture with routine healing: Secondary | ICD-10-CM | POA: Diagnosis not present

## 2018-04-13 DIAGNOSIS — M25551 Pain in right hip: Secondary | ICD-10-CM | POA: Diagnosis not present

## 2018-04-13 HISTORY — DX: Unspecified osteoarthritis, unspecified site: M19.90

## 2018-04-13 HISTORY — DX: Cerebral infarction, unspecified: I63.9

## 2018-04-13 LAB — CBC WITH DIFFERENTIAL/PLATELET
ABS IMMATURE GRANULOCYTES: 0 10*3/uL (ref 0.0–0.1)
BASOS PCT: 1 %
Basophils Absolute: 0.1 10*3/uL (ref 0.0–0.1)
EOS ABS: 0.4 10*3/uL (ref 0.0–0.7)
Eosinophils Relative: 4 %
HEMATOCRIT: 43 % (ref 36.0–46.0)
Hemoglobin: 13.9 g/dL (ref 12.0–15.0)
IMMATURE GRANULOCYTES: 0 %
Lymphocytes Relative: 12 %
Lymphs Abs: 1 10*3/uL (ref 0.7–4.0)
MCH: 31.1 pg (ref 26.0–34.0)
MCHC: 32.3 g/dL (ref 30.0–36.0)
MCV: 96.2 fL (ref 78.0–100.0)
MONOS PCT: 6 %
Monocytes Absolute: 0.5 10*3/uL (ref 0.1–1.0)
Neutro Abs: 6.5 10*3/uL (ref 1.7–7.7)
Neutrophils Relative %: 77 %
PLATELETS: 247 10*3/uL (ref 150–400)
RBC: 4.47 MIL/uL (ref 3.87–5.11)
RDW: 13.7 % (ref 11.5–15.5)
WBC: 8.6 10*3/uL (ref 4.0–10.5)

## 2018-04-13 LAB — BASIC METABOLIC PANEL
Anion gap: 8 (ref 5–15)
BUN: 20 mg/dL (ref 8–23)
CALCIUM: 9.3 mg/dL (ref 8.9–10.3)
CO2: 27 mmol/L (ref 22–32)
CREATININE: 1.41 mg/dL — AB (ref 0.44–1.00)
Chloride: 105 mmol/L (ref 98–111)
GFR calc Af Amer: 37 mL/min — ABNORMAL LOW (ref >60)
GFR calc non Af Amer: 32 mL/min — ABNORMAL LOW (ref >60)
GLUCOSE: 147 mg/dL — AB (ref 70–99)
Potassium: 4.1 mmol/L (ref 3.5–5.1)
Sodium: 140 mmol/L (ref 135–145)

## 2018-04-13 LAB — T4, FREE: FREE T4: 1.74 ng/dL (ref 0.82–1.77)

## 2018-04-13 LAB — LIPID PANEL
CHOLESTEROL: 212 mg/dL — AB (ref 0–200)
HDL: 47 mg/dL (ref >40)
LDL Cholesterol: 143 mg/dL — ABNORMAL HIGH (ref 0–99)
Total CHOL/HDL Ratio: 4.5 RATIO
Triglycerides: 110 mg/dL (ref <150)
VLDL: 22 mg/dL (ref 0–40)

## 2018-04-13 LAB — TSH: TSH: 1.005 u[IU]/mL (ref 0.350–4.500)

## 2018-04-13 LAB — PROTIME-INR
INR: 2.14
Prothrombin Time: 23.7 seconds — ABNORMAL HIGH (ref 11.4–15.2)

## 2018-04-13 LAB — MRSA PCR SCREENING: MRSA by PCR: NEGATIVE

## 2018-04-13 MED ORDER — METHOCARBAMOL 500 MG PO TABS: 500.0000 mg | ORAL_TABLET | Freq: Four times a day (QID) | ORAL | Status: DC | PRN

## 2018-04-13 MED ORDER — MORPHINE SULFATE (PF) 2 MG/ML IV SOLN: 0.5000 mg | INTRAVENOUS | Status: DC | PRN

## 2018-04-13 MED ORDER — TRIMETHOPRIM 100 MG PO TABS
100.0000 mg | ORAL_TABLET | Freq: Every day | ORAL | Status: DC
  Administered 2018-04-13 – 2018-04-20 (×7): 100 mg via ORAL
  Filled 2018-04-13 (×8): qty 1

## 2018-04-13 MED ORDER — LEVOTHYROXINE SODIUM 25 MCG PO TABS
125.0000 ug | ORAL_TABLET | Freq: Every day | ORAL | Status: DC
  Administered 2018-04-13 – 2018-04-20 (×7): 125 ug via ORAL
  Filled 2018-04-13 (×8): qty 1

## 2018-04-13 MED ORDER — POLYETHYLENE GLYCOL 3350 17 G PO PACK: 17.0000 g | PACK | Freq: Every day | ORAL | Status: DC | PRN

## 2018-04-13 MED ORDER — DARIFENACIN HYDROBROMIDE ER 15 MG PO TB24
15.0000 mg | ORAL_TABLET | Freq: Every day | ORAL | Status: DC
  Administered 2018-04-13: 15 mg via ORAL
  Filled 2018-04-13 (×3): qty 1

## 2018-04-13 MED ORDER — HYDROMORPHONE HCL 1 MG/ML IJ SOLN
0.5000 mg | INTRAMUSCULAR | Status: DC | PRN
  Administered 2018-04-13: 0.5 mg via INTRAVENOUS
  Filled 2018-04-13: qty 1

## 2018-04-13 MED ORDER — DILTIAZEM HCL ER COATED BEADS 120 MG PO CP24
120.0000 mg | ORAL_CAPSULE | Freq: Every day | ORAL | Status: DC
  Administered 2018-04-13: 120 mg via ORAL
  Filled 2018-04-13: qty 1

## 2018-04-13 MED ORDER — ONDANSETRON HCL 4 MG/2ML IJ SOLN: 4.0000 mg | Freq: Once | INTRAMUSCULAR | Status: DC | PRN

## 2018-04-13 MED ORDER — DOCUSATE SODIUM 100 MG PO CAPS
100.0000 mg | ORAL_CAPSULE | Freq: Two times a day (BID) | ORAL | Status: DC
  Administered 2018-04-13 – 2018-04-20 (×12): 100 mg via ORAL
  Filled 2018-04-13 (×13): qty 1

## 2018-04-13 MED ORDER — BISACODYL 5 MG PO TBEC
5.0000 mg | DELAYED_RELEASE_TABLET | Freq: Every day | ORAL | Status: DC | PRN
  Administered 2018-04-16: 5 mg via ORAL
  Filled 2018-04-13: qty 1

## 2018-04-13 MED ORDER — BISOPROLOL FUMARATE 10 MG PO TABS
10.0000 mg | ORAL_TABLET | Freq: Every day | ORAL | Status: DC
  Administered 2018-04-13: 10 mg via ORAL
  Filled 2018-04-13: qty 2

## 2018-04-13 MED ORDER — HEPARIN SODIUM (PORCINE) 5000 UNIT/ML IJ SOLN
5000.0000 [IU] | Freq: Three times a day (TID) | INTRAMUSCULAR | Status: DC
  Administered 2018-04-14: 5000 [IU] via SUBCUTANEOUS
  Filled 2018-04-13 (×3): qty 1

## 2018-04-13 MED ORDER — ONDANSETRON HCL 4 MG/2ML IJ SOLN
4.0000 mg | Freq: Once | INTRAMUSCULAR | Status: AC
  Administered 2018-04-13: 4 mg via INTRAVENOUS

## 2018-04-13 MED ORDER — FLEET ENEMA 7-19 GM/118ML RE ENEM: 1.0000 | ENEMA | Freq: Once | RECTAL | Status: DC | PRN

## 2018-04-13 MED ORDER — LACTATED RINGERS IV SOLN
INTRAVENOUS | Status: DC
  Administered 2018-04-13 – 2018-04-15 (×3): via INTRAVENOUS

## 2018-04-13 MED ORDER — METHOCARBAMOL 1000 MG/10ML IJ SOLN
500.0000 mg | Freq: Four times a day (QID) | INTRAVENOUS | Status: DC | PRN
  Administered 2018-04-14: 500 mg via INTRAVENOUS
  Filled 2018-04-13: qty 5

## 2018-04-13 MED ORDER — OXYCODONE HCL 5 MG PO TABS: 5.0000 mg | ORAL_TABLET | ORAL | Status: DC | PRN

## 2018-04-13 NOTE — Progress Notes (Signed)
Orthopedic Tech Progress Note Patient Details:  Makayla Thomas 10-01-1925 [id number]  Ortho Devices Type of Ortho Device: Ace wrap, Knee Immobilizer, Post (short leg) splint Ortho Device/Splint Location: lle Ortho Device/Splint Interventions: Application   Post Interventions Patient Tolerated: Well Instructions Provided: Care of device   Makayla Thomas 04/13/2018, 10:14 AM

## 2018-04-13 NOTE — Consult Note (Signed)
Reason for Consult:Left femur/ankle fxs Referring Physician: R Jauna Thomas is an 84 y.o. female.  HPI: Makayla Thomas was at her ALF when she fell. The circumstances are unclear and she cannot tell me what happened. She was brought to the ED where x-rays showed a left periprosthetic femur fx and a left bimal ankle fx with intact mortise. Orthopedic surgery was consulted. She suffers from some dementia and is confabulatory on my exam though would answer most of my questions but not always directly. I spoke with her eldest son (health care POA, pharmacist) who confirmed she has been worse in the past week and thinks her BP meds may be responsible for the fall.  Past Medical History:  Diagnosis Date  . Anemia   . Aortic stenosis, moderate (was mild) 08/2009; 08/2014   a. ECHO  EF 55-60%, wth increased EDP, mild AS;; b. mild Conc LVH, EF 60-65%, DD with high LVEDP, MIld-Mod AS, Mod MAC with Mod MR, mild-mod PA HTN.  [name] Arthritis   . Borderline diabetes   . Chronic anticoagulation    on Xarelto  . Complication of anesthesia    hallusinations  . Constipation   . Diabetes mellitus without complication ([place])    possibly  . Dyslipidemia    treated  . Dysrhythmia    A-FIB  . H/O cardiovascular stress test 05/2010   Lexiscan cardiolite no ischemia or infarction  . H/O multiple sclerosis    no excaerbations in some time  . History of ETT 02/2011   [place] exercise protocol, negative with poor exercise tolerance  . History of transfusion   . Hypertension   . Hypothyroidism   . Lower extremity edema    chronic  . PAD (peripheral artery disease) ([place]) 08/30/2009   Dopplers; with bil. post. tib artery occlusion  . Permanent atrial fibrillation ([place])   . Transient ischemic attack (TIA)    NO RESIDUAL PROBLEMS  . Vaginal itching   . Visual disturbances    "FLASHING IN MY EYES"    Past Surgical History:  Procedure Laterality Date  . BREAST SURGERY     L tumor removed - benign  . CESAREAN  SECTION     hx of 3  . CHOLECYSTECTOMY    . JOINT REPLACEMENT  2012   left total knee  . ORIF PERIPROSTHETIC FRACTURE Right 02/07/2015   Procedure: OPEN REDUCTION INTERNAL FIXATION (ORIF) PERIPROSTHETIC FRACTURE WITH TOTAL HIP REVISION AND CABLES;  Surgeon: Paralee Cancel, MD;  Location: [facility];  Service: Orthopedics;  Laterality: Right;  . throidectomy partial     lt  . TOTAL HIP ARTHROPLASTY Right 01/29/2015   Procedure: RIGHT TOTAL HIP ARTHROPLASTY ANTERIOR APPROACH;  Surgeon: Paralee Cancel, MD;  Location: [facility];  Service: Orthopedics;  Laterality: Right;    Family History  Problem Relation Age of Onset  . Heart failure Father 56  . Lung cancer Father     Social History:  reports that she has never smoked. She has never used smokeless tobacco. She reports that she does not drink alcohol or use drugs.  Allergies:  Allergies  Allergen Reactions  . Tetanus Antitoxin Anaphylaxis    Throat swelling  . Augmentin [Amoxicillin-Pot Clavulanate] Other (See Comments)    unknown  . Codeine Other (See Comments)    unknown  . Livalo [Pitavastatin] Other (See Comments)    unknown  . Meclizine Other (See Comments)    Had funny feeling  . Tramadol Other (See Comments)    hallucinations  Medications: I have reviewed the patient's current medications.  Results for orders placed or performed during the hospital encounter of 04/13/18 (from the past 48 hour(s))  Basic metabolic panel     Status: Abnormal   Collection Time: 04/13/18  6:13 AM  Result Value Ref Range   Sodium 140 135 - 145 mmol/L   Potassium 4.1 3.5 - 5.1 mmol/L   Chloride 105 98 - 111 mmol/L   CO2 27 22 - 32 mmol/L   Glucose, Bld 147 (H) 70 - 99 mg/dL   BUN 20 8 - 23 mg/dL   Creatinine, Ser 1.41 (H) 0.44 - 1.00 mg/dL   Calcium 9.3 8.9 - 10.3 mg/dL   GFR calc non Af Amer 32 (L) >60 mL/min   GFR calc Af Amer 37 (L) >60 mL/min    Comment: (NOTE) The eGFR has been calculated using the CKD EPI equation. This calculation  has not been validated in all clinical situations. eGFR's persistently <60 mL/min signify possible Chronic Kidney Disease.    Anion gap 8 5 - 15    Comment: Performed at [facility] [address] [place], [place] [address]  CBC WITH DIFFERENTIAL     Status: None   Collection Time: 04/13/18  6:13 AM  Result Value Ref Range   WBC 8.6 4.0 - 10.5 K/uL   RBC 4.47 3.87 - 5.11 MIL/uL   Hemoglobin 13.9 12.0 - 15.0 g/dL   HCT 43.0 36.0 - 46.0 %   MCV 96.2 78.0 - 100.0 fL   MCH 31.1 26.0 - 34.0 pg   MCHC 32.3 30.0 - 36.0 g/dL   RDW 13.7 11.5 - 15.5 %   Platelets 247 150 - 400 K/uL   Neutrophils Relative % 77 %   Neutro Abs 6.5 1.7 - 7.7 K/uL   Lymphocytes Relative 12 %   Lymphs Abs 1.0 0.7 - 4.0 K/uL   Monocytes Relative 6 %   Monocytes Absolute 0.5 0.1 - 1.0 K/uL   Eosinophils Relative 4 %   Eosinophils Absolute 0.4 0.0 - 0.7 K/uL   Basophils Relative 1 %   Basophils Absolute 0.1 0.0 - 0.1 K/uL   Immature Granulocytes 0 %   Abs Immature Granulocytes 0.0 0.0 - 0.1 K/uL    Comment: Performed at [facility], [address], [place], [place] [address]  Protime-INR     Status: Abnormal   Collection Time: 04/13/18  6:32 AM  Result Value Ref Range   Prothrombin Time 23.7 (H) 11.4 - 15.2 seconds   INR 2.14     Comment: Performed at [facility] [address], [place], [place] [address]  Type and screen [place]     Status: None (Preliminary result)   Collection Time: 04/13/18  6:51 AM  Result Value Ref Range   ABO/RH(D) O POS    Antibody Screen NEG    Sample Expiration      04/16/2018 Performed at [facility], [place] [address] [place], [place] [address]    Unit Number [id number]    Blood Component Type RED CELLS,LR    Unit division 00    Status of Unit ALLOCATED    Transfusion Status OK TO TRANSFUSE    Crossmatch Result COMPATIBLE    Unit Number [id number]    Blood Component Type RED CELLS,LR    Unit division 00    Status  of Unit ALLOCATED    Transfusion Status OK TO TRANSFUSE  Crossmatch Result COMPATIBLE     Dg Chest 1 View  Result Date: 04/13/2018 CLINICAL DATA:  84 year old female with a history of fall and a left leg deformity EXAM: CHEST  1 VIEW COMPARISON:  03/11/2018, 06/27/2017 FINDINGS: Cardiomediastinal silhouette unchanged in size and contour with cardiomegaly. Right rotation somewhat accentuates the heart borders. Calcifications of the aortic arch. Low lung volumes. Coarsened interstitial markings, similar to prior. No confluent airspace disease, pneumothorax, or pleural effusion. Degenerative changes of the spine IMPRESSION: Chronic lung changes and likely atelectasis with no evidence of acute cardiopulmonary disease. Cardiomegaly Electronically Signed   By: Corrie Mckusick D.O.   On: 04/13/2018 07:00   Dg Tibia/fibula Left  Result Date: 04/13/2018 CLINICAL DATA:  84 y/o F; fall this morning with left leg deformity. EXAM: LEFT TIBIA AND FIBULA - 2 VIEW; LEFT FEMUR 2 VIEWS; LEFT FOOT - COMPLETE 3+ VIEW COMPARISON:  01/03/2014 CT of the left knee. FINDINGS: Left femur: Acute comminuted transverse fracture of the lower femoral diaphysis just above the knee prosthesis with 1 shaft's width posterior displacement of the distal fracture component. The hip joint appears well maintained. Left tibia and fibula: Left distal femur fracture as above. Total knee prosthesis without periprosthetic lucency. Chronic fracture deformity of the patella. Vascular calcifications noted. Acute minimally displaced fracture of the medial malleolus. Acute oblique minimally displaced fracture of the lower fibula above level of tibial plafond. No ankle joint dislocation identified on these nonstress views. Possible minimally displaced fracture of the posterior malleolus. Left foot: There is no evidence of fracture or other focal bone lesions the foot. Soft tissues are unremarkable. IMPRESSION: 1. Acute comminuted transverse fracture of  the left lower femur diaphysis just above the knee prosthesis. One shaft's width posterior displacement of distal fracture component. 2. Acute minimally displaced fracture of left medial malleolus. 3. Acute minimally displaced fracture of the left lower fibula diaphysis above level of tibial plafond. 4. Possible minimally displaced acute fracture of posterior malleolus. 5. Chronic fracture deformity of the left patella. Electronically Signed   By: Kristine Garbe M.D.   On: 04/13/2018 06:46   Dg Ankle 2 Views Left  Result Date: 04/13/2018 CLINICAL DATA:  Recent fall with left ankle pain and deformity, initial encounter EXAM: LEFT ANKLE - 2 VIEW COMPARISON:  Films from earlier in the same day. FINDINGS: Oblique fracture through the distal fibular metaphysis is noted. Additionally a transverse fracture through the medial malleolus is seen. Slight angulation laterally at the fracture site is noted. Soft tissue swelling is seen. No other fracture is identified. IMPRESSION: Distal tibial and fibular fractures as described. No definitive posterior malleolar fracture is seen. Electronically Signed   By: Inez Catalina M.D.   On: 04/13/2018 08:16   Ct Head Wo Contrast  Result Date: 04/13/2018 CLINICAL DATA:  Recent fall with head injury EXAM: CT HEAD WITHOUT CONTRAST CT CERVICAL SPINE WITHOUT CONTRAST TECHNIQUE: Multidetector CT imaging of the head and cervical spine was performed following the standard protocol without intravenous contrast. Multiplanar CT image reconstructions of the cervical spine were also generated. COMPARISON:  03/11/2018 FINDINGS: CT HEAD FINDINGS Brain: Chronic atrophic changes and chronic white matter ischemic changes again noted and stable. No findings to suggest acute hemorrhage, acute infarction space-occupying lesion seen. Vascular: No hyperdense vessel or unexpected calcification. Skull: Normal. Negative for fracture or focal lesion. Sinuses/Orbits: No acute finding. Other: None.  CT CERVICAL SPINE FINDINGS Alignment: Within normal limits. Skull base and vertebrae: 7 cervical segments are well visualized. Vertebral body height  is well maintained. Multilevel disc space narrowing is noted from C3-C7. Mild osteophytic changes and facet hypertrophic changes are noted. No findings to suggest acute fracture or facet abnormality seen. The odontoid is within normal limits. Multilevel neural foraminal narrowing is noted bilaterally. Soft tissues and spinal canal: Diffuse vascular calcifications are noted. No acute soft tissue abnormality is noted. Upper chest: Within normal limits Other: None IMPRESSION: CT of the head: Chronic atrophic and ischemic changes without acute abnormality. CT of the cervical spine: Multilevel degenerative change without acute abnormality. Electronically Signed   By: Inez Catalina M.D.   On: 04/13/2018 07:58   Ct Cervical Spine Wo Contrast  Result Date: 04/13/2018 CLINICAL DATA:  Recent fall with head injury EXAM: CT HEAD WITHOUT CONTRAST CT CERVICAL SPINE WITHOUT CONTRAST TECHNIQUE: Multidetector CT imaging of the head and cervical spine was performed following the standard protocol without intravenous contrast. Multiplanar CT image reconstructions of the cervical spine were also generated. COMPARISON:  03/11/2018 FINDINGS: CT HEAD FINDINGS Brain: Chronic atrophic changes and chronic white matter ischemic changes again noted and stable. No findings to suggest acute hemorrhage, acute infarction space-occupying lesion seen. Vascular: No hyperdense vessel or unexpected calcification. Skull: Normal. Negative for fracture or focal lesion. Sinuses/Orbits: No acute finding. Other: None. CT CERVICAL SPINE FINDINGS Alignment: Within normal limits. Skull base and vertebrae: 7 cervical segments are well visualized. Vertebral body height is well maintained. Multilevel disc space narrowing is noted from C3-C7. Mild osteophytic changes and facet hypertrophic changes are noted. No  findings to suggest acute fracture or facet abnormality seen. The odontoid is within normal limits. Multilevel neural foraminal narrowing is noted bilaterally. Soft tissues and spinal canal: Diffuse vascular calcifications are noted. No acute soft tissue abnormality is noted. Upper chest: Within normal limits Other: None IMPRESSION: CT of the head: Chronic atrophic and ischemic changes without acute abnormality. CT of the cervical spine: Multilevel degenerative change without acute abnormality. Electronically Signed   By: Inez Catalina M.D.   On: 04/13/2018 07:58   Dg Foot Complete Left  Result Date: 04/13/2018 CLINICAL DATA:  84 y/o F; fall this morning with left leg deformity. EXAM: LEFT TIBIA AND FIBULA - 2 VIEW; LEFT FEMUR 2 VIEWS; LEFT FOOT - COMPLETE 3+ VIEW COMPARISON:  01/03/2014 CT of the left knee. FINDINGS: Left femur: Acute comminuted transverse fracture of the lower femoral diaphysis just above the knee prosthesis with 1 shaft's width posterior displacement of the distal fracture component. The hip joint appears well maintained. Left tibia and fibula: Left distal femur fracture as above. Total knee prosthesis without periprosthetic lucency. Chronic fracture deformity of the patella. Vascular calcifications noted. Acute minimally displaced fracture of the medial malleolus. Acute oblique minimally displaced fracture of the lower fibula above level of tibial plafond. No ankle joint dislocation identified on these nonstress views. Possible minimally displaced fracture of the posterior malleolus. Left foot: There is no evidence of fracture or other focal bone lesions the foot. Soft tissues are unremarkable. IMPRESSION: 1. Acute comminuted transverse fracture of the left lower femur diaphysis just above the knee prosthesis. One shaft's width posterior displacement of distal fracture component. 2. Acute minimally displaced fracture of left medial malleolus. 3. Acute minimally displaced fracture of the left  lower fibula diaphysis above level of tibial plafond. 4. Possible minimally displaced acute fracture of posterior malleolus. 5. Chronic fracture deformity of the left patella. Electronically Signed   By: Kristine Garbe M.D.   On: 04/13/2018 06:46   Dg Femur Min 2 Views  Left  Result Date: 04/13/2018 CLINICAL DATA:  84 y/o F; fall this morning with left leg deformity. EXAM: LEFT TIBIA AND FIBULA - 2 VIEW; LEFT FEMUR 2 VIEWS; LEFT FOOT - COMPLETE 3+ VIEW COMPARISON:  01/03/2014 CT of the left knee. FINDINGS: Left femur: Acute comminuted transverse fracture of the lower femoral diaphysis just above the knee prosthesis with 1 shaft's width posterior displacement of the distal fracture component. The hip joint appears well maintained. Left tibia and fibula: Left distal femur fracture as above. Total knee prosthesis without periprosthetic lucency. Chronic fracture deformity of the patella. Vascular calcifications noted. Acute minimally displaced fracture of the medial malleolus. Acute oblique minimally displaced fracture of the lower fibula above level of tibial plafond. No ankle joint dislocation identified on these nonstress views. Possible minimally displaced fracture of the posterior malleolus. Left foot: There is no evidence of fracture or other focal bone lesions the foot. Soft tissues are unremarkable. IMPRESSION: 1. Acute comminuted transverse fracture of the left lower femur diaphysis just above the knee prosthesis. One shaft's width posterior displacement of distal fracture component. 2. Acute minimally displaced fracture of left medial malleolus. 3. Acute minimally displaced fracture of the left lower fibula diaphysis above level of tibial plafond. 4. Possible minimally displaced acute fracture of posterior malleolus. 5. Chronic fracture deformity of the left patella. Electronically Signed   By: Kristine Garbe M.D.   On: 04/13/2018 06:46   Dg Hips Bilat W Or Wo Pelvis 3-4  Views  Result Date: 04/13/2018 CLINICAL DATA:  Recent fall with bilateral hip pain left greater than right EXAM: DG HIP (WITH OR WITHOUT PELVIS) 3-4V BILAT COMPARISON:  03/11/2018 FINDINGS: Prior right hip replacement is again identified and stable. The pelvic ring is intact. No acute fracture or dislocation is noted. No soft tissue changes are seen. IMPRESSION: No acute abnormality noted. Electronically Signed   By: Inez Catalina M.D.   On: 04/13/2018 08:14    Review of Systems  Unable to perform ROS: Dementia  Musculoskeletal: Positive for joint pain (Left leg).   Blood pressure 125/70, pulse 63, temperature 98.6 F (37 C), temperature source Oral, resp. rate 20, height _0  (1.575 m), weight 75 kg, SpO2 98 %. Physical Exam  Constitutional: She appears well-developed and well-nourished. No distress.  HENT:  Head: Normocephalic and atraumatic.  Eyes: Conjunctivae are normal. Right eye exhibits no discharge. Left eye exhibits no discharge. No scleral icterus.  Neck: Normal range of motion.  Cardiovascular: Normal rate and regular rhythm.  Respiratory: Effort normal. No respiratory distress.  Musculoskeletal:  LLE No traumatic wounds or rash  TTP ankle, knee  Sens DPN, SPN, TN intact  Motor EHL, ext, flex, evers 5/5  DP 1+, PT 1+, No significant edema  Neurological: She is alert.  Skin: Skin is warm and dry. She is not diaphoretic.  Psychiatric: She has a normal mood and affect. Her speech is tangential. She is agitated.  Mildly confabulatory    Assessment/Plan: Fall Left periprosthetic femur fx -- Plan for ORIF in AM if IM can clear Left ankle fx -- Would benefit from ORIF, edema may preclude immediate ORIF Dementia HTN Hypothyroidism S/p CVA on [place], PA-C Orthopedic Surgery [phone] 04/13/2018, 9:23 AM

## 2018-04-13 NOTE — Consult Note (Addendum)
Cardiology Consultation:   Patient ID: Makayla Thomas; [id number]; 02-27-1926   Admit date: 04/13/2018 Date of Consult: 04/13/2018  Primary Care Provider: Deland Pretty, MD Primary Cardiologist: Dr. Glenetta Hew, MD   Patient Profile:   Makayla Thomas is a 84 y.o. female with a hx of permanent atrial fibrillation on low dose Xarelto, mild to moderate aortic stenosis (mean gradient 38mHG), essential hypertension, dyslipidemia, TIA, PAD, hypothyroidism, MS, advanced dementia and DM2 who is being seen today for pre-operative evaluation for femur and ankle fx and need for ORIF scheduled for 04/14/18 at the request of Dr. YLorin Mercy  History of Present Illness:   Ms. HRobellois a 89yoF with a history as stated above who [facility]/p mechanical fall resulting in a femur and left ankle fracture. Patient unable to provide HPI secondary to advanced dementia and there is no family at bedside.  It is unclear as to the events that led to Ms. Pucciarelli's fall. She cannot describe if she had any presipitaing symptoms prior to her fall. She denies chest pain or palpitations. She states that she is not having surgery and is currently disoriented about place and time.   At baseline per chart review, she has fairly good quality of life. She lives in an independent living facility and has medication assistance from a recently hired RTherapist, sports She walks with a walker and son denied that she had had recent complaints of SOB at rest but does have some with exertion per chart review. He denied that she has had any CP, palpitations, syncope, dizziness, or s/s of active bleeding symptoms.   In the ED, imaging performed showed a left periprosthetic femur fracture and a left ankle fracture. Orthopedic surgery was consulted given the above. She was given IV pain medications with some pain relief.  Admitting MD spoke with son who feels her fall was a result of a recent medication change related to her  bisoprolol. Per Ortho note, plan for ORIF on 04/14/2018 if cleared by internal medicine. EKG with controlled atrial fibrillation, HR 73. TSH within normal limits at 1.005.  Elevated at 1.41. INR noted to be 2.14.  Internal medicine asked for cardiac clearance, as well as Cardiology given her hx of AF and AS.   Past Medical History:  Diagnosis Date  . Anemia   . Aortic stenosis, moderate (was mild) 08/2009; 08/2014   a. ECHO  EF 55-60%, wth increased EDP, mild AS;; b. mild Conc LVH, EF 60-65%, DD with high LVEDP, MIld-Mod AS, Mod MAC with Mod MR, mild-mod PA HTN.  .Marland KitchenArthritis   . Complication of anesthesia    hallusinations  . Constipation   . Diabetes mellitus without complication ([facility] [place]    possibly  . Dyslipidemia    treated  . H/O cardiovascular stress test 05/2010   Lexiscan cardiolite no ischemia or infarction  . H/O multiple sclerosis    no excaerbations in some time  . History of ETT 02/2011   [place] exercise protocol, negative with poor exercise tolerance  . History of transfusion   . Hypertension   . Hypothyroidism   . Lower extremity edema    chronic  . PAD (peripheral artery disease) ([facility] 08[place]   Dopplers; with bil. post. tib artery occlusion  . Permanent atrial fibrillation (HCC)    on Xarelto  . Transient ischemic attack (TIA)    NO RESIDUAL PROBLEMS  . Vaginal itching   . Visual disturbances    "FLASHING IN MY EYES"  Past Surgical History:  Procedure Laterality Date  . BREAST SURGERY     L tumor removed - benign  . CESAREAN SECTION     hx of 3  . CHOLECYSTECTOMY    . JOINT REPLACEMENT  2012   left total knee  . ORIF PERIPROSTHETIC FRACTURE Right 02/07/2015   Procedure: OPEN REDUCTION INTERNAL FIXATION (ORIF) PERIPROSTHETIC FRACTURE WITH TOTAL HIP REVISION AND CABLES;  Surgeon: Paralee Cancel, MD;  Location: [facility];  Service: Orthopedics;  Laterality: Right;  . throidectomy partial     lt  . TOTAL HIP ARTHROPLASTY Right 01/29/2015   Procedure: RIGHT  TOTAL HIP ARTHROPLASTY ANTERIOR APPROACH;  Surgeon: Paralee Cancel, MD;  Location: [facility];  Service: Orthopedics;  Laterality: Right;     Prior to Admission medications   Medication Sig Start Date End Date Taking? Authorizing Provider  bisoprolol (ZEBETA) 10 MG tablet Take 1 tablet (10 mg total) by mouth daily. BISOPROLOL 1/2 TABDAILY ,IF PALPITION INCREASE OK TO TAKE ANOTHER 1/2 TABLET. Patient taking differently: Take 10-15 mg by mouth daily.  02/23/17  Yes Leonie Man, MD  CARTIA XT 120 MG 24 hr capsule TAKE ONE CAPSULE BY MOUTH AT BEDTIME Patient taking differently: Take 120 mg by mouth at bedtime.  01/22/17  Yes Leonie Man, MD  cholecalciferol (VITAMIN D) 1000 UNITS tablet Take 1,000 Units by mouth every morning.    Yes [provider]  Cranberry 500 MG TABS Take 250 mg by mouth daily.    Yes [provider]  levothyroxine (SYNTHROID, LEVOTHROID) 125 MCG tablet Take 125 mcg by mouth daily before breakfast.   Yes [provider]  Rivaroxaban (XARELTO) 15 MG TABS tablet Take 1 tablet (15 mg total) by mouth daily with breakfast. Patient taking differently: Take 15 mg by mouth daily with supper.  02/12/15  Yes Hosie Poisson, MD  solifenacin (VESICARE) 5 MG tablet Take 10 mg by mouth daily. 02/08/18  Yes [provider]  triamterene-hydrochlorothiazide (MAXZIDE-25) 37.5-25 MG per tablet Take 0.5 tablets by mouth daily.    Yes [provider]  trimethoprim (TRIMPEX) 100 MG tablet Take 100 mg by mouth daily.  01/06/18  Yes [provider]    Inpatient Medications: Scheduled Meds: . bisoprolol  10-15 mg Oral Daily  . darifenacin  15 mg Oral Daily  . diltiazem  120 mg Oral QHS  . docusate sodium  100 mg Oral BID  . [START ON 04/14/2018] heparin  5,000 Units Subcutaneous Q8H  . levothyroxine  125 mcg Oral QAC breakfast  . trimethoprim  100 mg Oral Daily   Continuous Infusions: . lactated ringers 75 mL/hr at 04/13/18 1048  .  methocarbamol (ROBAXIN) IV     PRN Meds: bisacodyl, methocarbamol **OR** methocarbamol (ROBAXIN) IV, morphine injection, oxyCODONE, polyethylene glycol, sodium phosphate  Allergies:    Allergies  Allergen Reactions  . Tetanus Antitoxin Anaphylaxis    Throat swelling  . Augmentin [Amoxicillin-Pot Clavulanate] Other (See Comments)    unknown  . Codeine Other (See Comments)    unknown  . Livalo [Pitavastatin] Other (See Comments)    unknown  . Meclizine Other (See Comments)    Had funny feeling  . Tramadol Other (See Comments)    hallucinations    Social History:   Social History   Socioeconomic History  . Marital status: Widowed    Spouse name: Not on file  . Number of children: 3  . Years of education: HS  . Highest education level: Not on file  Occupational History  . Occupation: Retired  Scientific laboratory technician  . Financial resource strain: Not on file  . Food insecurity:    Worry: Not on file    Inability: Not on file  . Transportation needs:    Medical: Not on file    Non-medical: Not on file  Tobacco Use  . Smoking status: Never Smoker  . Smokeless tobacco: Never Used  Substance and Sexual Activity  . Alcohol use: No    Alcohol/week: 0.0 standard drinks  . Drug use: No    Types: Fentanyl  . Sexual activity: Not on file  Lifestyle  . Physical activity:    Days per week: Not on file    Minutes per session: Not on file  . Stress: Not on file  Relationships  . Social connections:    Talks on phone: Not on file    Gets together: Not on file    Attends religious service: Not on file    Active member of club or organization: Not on file    Attends meetings of clubs or organizations: Not on file    Relationship status: Not on file  . Intimate partner violence:    Fear of current or ex partner: Not on file    Emotionally abused: Not on file    Physically abused: Not on file    Forced sexual activity: Not on file  Other Topics Concern  . Not on file  Social  History Narrative   Uses a walker to walk   Drinks 1-2 cups of coffee a day     Family History:   Family History  Problem Relation Age of Onset  . Heart failure Father 38  . Lung cancer Father    Family Status:  Family Status  Relation Name Status  . Father  Deceased  . Mother  Deceased    ROS:  Please see the history of present illness.  All other ROS reviewed and negative.     Physical Exam/Data:   Vitals:   04/13/18 0915 04/13/18 0930 04/13/18 0945 04/13/18 1037  BP: 128/75 136/79 (!) 142/126 136/78  Pulse: 68 69 76 76  Resp: 16 14 12    Temp:    98.1 F (36.7 C)  TempSrc:    Oral  SpO2: 97% 94% 92%   Weight:      Height:       No intake or output data in the 24 hours ending 04/13/18 1209 Filed Weights   04/13/18 0545  Weight: 75 kg   Body mass index is 30.24 kg/m.   General: Frail, elderly, NAD Skin: Warm, dry, intact  Head: Normocephalic, atraumatic, clear, moist mucus membranes. Neck: Negative for carotid bruits. No JVD Lungs:Clear to ausculation bilaterally. No wheezes, rales, or rhonchi. Breathing is unlabored. Cardiovascular: Irregularly irregular with S1 S2. + murmur. No rubs, gallops, or LV heave appreciated. Abdomen: Soft, non-tender, non-distended with normoactive bowel sounds.No obvious abdominal masses. MSK: Strength and tone appear normal for age. 5/5 in all extremities Extremities: No edema. No clubbing or cyanosis. DP/PT pulses 1+ bilaterally Neuro: Alert and disoriented to place and time. No focal deficits. No facial asymmetry. MAE spontaneously. Psych: Responds to some questions appropriately with normal affect. Disoriented at baseline.      EKG:  The EKG was personally reviewed and demonstrates: 04/13/17 Atrial fibrillation HR 73 Telemetry:  Telemetry was personally reviewed and demonstrates: 04/13/18 AF HR 77  Relevant CV Studies:  ECHO: 09/01/2014 Study Conclusions  - Left ventricle: The cavity size  was normal. There was  mild concentric hypertrophy. Systolic function was normal. The estimated ejection fraction was in the range of 60% to 65%. Although no diagnostic regional wall motion abnormality was identified, this possibility cannot be completely excluded on the basis of this study. Doppler parameters are consistent with high ventricular filling pressure. - Aortic valve: Cusp separation was reduced. There was mild to moderate stenosis. Peak velocity (S): 281 cm/s. Mean gradient (S): 18 mm Hg. - Mitral valve: Moderately calcified annulus. There was moderate regurgitation. - Left atrium: The atrium was moderately dilated. - Right atrium: The atrium was mildly dilated. - Tricuspid valve: There was moderate regurgitation. - Pulmonary arteries: Systolic pressure was mildly to moderately increased. PA peak pressure: 44 mm Hg (S).  Impressions:  - No cardiac source of emboli was indentified. When compared to prior ECHO in 2011, aortic stenosis has increased.  CATH: None   Laboratory Data:  Chemistry Recent Labs  Lab 04/13/18 0613  NA 140  K 4.1  CL 105  CO2 27  GLUCOSE 147*  BUN 20  CREATININE 1.41*  CALCIUM 9.3  GFRNONAA 32*  GFRAA 37*  ANIONGAP 8    Total Protein  Date Value Ref Range Status  07/28/2016 7.0 6.5 - 8.1 g/dL Final   Albumin  Date Value Ref Range Status  07/28/2016 4.0 3.5 - 5.0 g/dL Final   AST  Date Value Ref Range Status  07/28/2016 35 15 - 41 U/L Final   ALT  Date Value Ref Range Status  07/28/2016 32 14 - 54 U/L Final   Alkaline Phosphatase  Date Value Ref Range Status  07/28/2016 86 38 - 126 U/L Final   Total Bilirubin  Date Value Ref Range Status  07/28/2016 0.8 0.3 - 1.2 mg/dL Final   Hematology Recent Labs  Lab 04/13/18 0613  WBC 8.6  RBC 4.47  HGB 13.9  HCT 43.0  MCV 96.2  MCH 31.1  MCHC 32.3  RDW 13.7  PLT 247   Cardiac EnzymesNo results for input(s): TROPONINI in the last 168 hours. No results for input(s):  TROPIPOC in the last 168 hours.  BNPNo results for input(s): BNP, PROBNP in the last 168 hours.  DDimer No results for input(s): DDIMER in the last 168 hours. TSH:  Lab Results  Component Value Date   TSH 0.123 (L) 08/30/2014   Lipids: Lab Results  Component Value Date   CHOL 212 (H) 04/13/2018   HDL 47 04/13/2018   LDLCALC 143 (H) 04/13/2018   TRIG 110 04/13/2018   CHOLHDL 4.5 04/13/2018   HgbA1c: Lab Results  Component Value Date   HGBA1C 6.4 (H) 08/31/2014    Radiology/Studies:  Dg Chest 1 View  Result Date: 04/13/2018 CLINICAL DATA:  84 year old female with a history of fall and a left leg deformity EXAM: CHEST  1 VIEW COMPARISON:  03/11/2018, 06/27/2017 FINDINGS: Cardiomediastinal silhouette unchanged in size and contour with cardiomegaly. Right rotation somewhat accentuates the heart borders. Calcifications of the aortic arch. Low lung volumes. Coarsened interstitial markings, similar to prior. No confluent airspace disease, pneumothorax, or pleural effusion. Degenerative changes of the spine IMPRESSION: Chronic lung changes and likely atelectasis with no evidence of acute cardiopulmonary disease. Cardiomegaly Electronically Signed   By: Corrie Mckusick D.O.   On: 04/13/2018 07:00   Dg Tibia/fibula Left  Result Date: 04/13/2018 CLINICAL DATA:  84 y/o F; fall this morning with left leg deformity. EXAM: LEFT TIBIA AND FIBULA - 2 VIEW; LEFT FEMUR 2 VIEWS; LEFT FOOT -  COMPLETE 3+ VIEW COMPARISON:  01/03/2014 CT of the left knee. FINDINGS: Left femur: Acute comminuted transverse fracture of the lower femoral diaphysis just above the knee prosthesis with 1 shaft's width posterior displacement of the distal fracture component. The hip joint appears well maintained. Left tibia and fibula: Left distal femur fracture as above. Total knee prosthesis without periprosthetic lucency. Chronic fracture deformity of the patella. Vascular calcifications noted. Acute minimally displaced fracture of  the medial malleolus. Acute oblique minimally displaced fracture of the lower fibula above level of tibial plafond. No ankle joint dislocation identified on these nonstress views. Possible minimally displaced fracture of the posterior malleolus. Left foot: There is no evidence of fracture or other focal bone lesions the foot. Soft tissues are unremarkable. IMPRESSION: 1. Acute comminuted transverse fracture of the left lower femur diaphysis just above the knee prosthesis. One shaft's width posterior displacement of distal fracture component. 2. Acute minimally displaced fracture of left medial malleolus. 3. Acute minimally displaced fracture of the left lower fibula diaphysis above level of tibial plafond. 4. Possible minimally displaced acute fracture of posterior malleolus. 5. Chronic fracture deformity of the left patella. Electronically Signed   By: Kristine Garbe M.D.   On: 04/13/2018 06:46   Dg Ankle 2 Views Left  Result Date: 04/13/2018 CLINICAL DATA:  Recent fall with left ankle pain and deformity, initial encounter EXAM: LEFT ANKLE - 2 VIEW COMPARISON:  Films from earlier in the same day. FINDINGS: Oblique fracture through the distal fibular metaphysis is noted. Additionally a transverse fracture through the medial malleolus is seen. Slight angulation laterally at the fracture site is noted. Soft tissue swelling is seen. No other fracture is identified. IMPRESSION: Distal tibial and fibular fractures as described. No definitive posterior malleolar fracture is seen. Electronically Signed   By: Inez Catalina M.D.   On: 04/13/2018 08:16   Ct Head Wo Contrast  Result Date: 04/13/2018 CLINICAL DATA:  Recent fall with head injury EXAM: CT HEAD WITHOUT CONTRAST CT CERVICAL SPINE WITHOUT CONTRAST TECHNIQUE: Multidetector CT imaging of the head and cervical spine was performed following the standard protocol without intravenous contrast. Multiplanar CT image reconstructions of the cervical spine  were also generated. COMPARISON:  03/11/2018 FINDINGS: CT HEAD FINDINGS Brain: Chronic atrophic changes and chronic white matter ischemic changes again noted and stable. No findings to suggest acute hemorrhage, acute infarction space-occupying lesion seen. Vascular: No hyperdense vessel or unexpected calcification. Skull: Normal. Negative for fracture or focal lesion. Sinuses/Orbits: No acute finding. Other: None. CT CERVICAL SPINE FINDINGS Alignment: Within normal limits. Skull base and vertebrae: 7 cervical segments are well visualized. Vertebral body height is well maintained. Multilevel disc space narrowing is noted from C3-C7. Mild osteophytic changes and facet hypertrophic changes are noted. No findings to suggest acute fracture or facet abnormality seen. The odontoid is within normal limits. Multilevel neural foraminal narrowing is noted bilaterally. Soft tissues and spinal canal: Diffuse vascular calcifications are noted. No acute soft tissue abnormality is noted. Upper chest: Within normal limits Other: None IMPRESSION: CT of the head: Chronic atrophic and ischemic changes without acute abnormality. CT of the cervical spine: Multilevel degenerative change without acute abnormality. Electronically Signed   By: Inez Catalina M.D.   On: 04/13/2018 07:58   Ct Cervical Spine Wo Contrast  Result Date: 04/13/2018 CLINICAL DATA:  Recent fall with head injury EXAM: CT HEAD WITHOUT CONTRAST CT CERVICAL SPINE WITHOUT CONTRAST TECHNIQUE: Multidetector CT imaging of the head and cervical spine was performed following the standard  protocol without intravenous contrast. Multiplanar CT image reconstructions of the cervical spine were also generated. COMPARISON:  03/11/2018 FINDINGS: CT HEAD FINDINGS Brain: Chronic atrophic changes and chronic white matter ischemic changes again noted and stable. No findings to suggest acute hemorrhage, acute infarction space-occupying lesion seen. Vascular: No hyperdense vessel or  unexpected calcification. Skull: Normal. Negative for fracture or focal lesion. Sinuses/Orbits: No acute finding. Other: None. CT CERVICAL SPINE FINDINGS Alignment: Within normal limits. Skull base and vertebrae: 7 cervical segments are well visualized. Vertebral body height is well maintained. Multilevel disc space narrowing is noted from C3-C7. Mild osteophytic changes and facet hypertrophic changes are noted. No findings to suggest acute fracture or facet abnormality seen. The odontoid is within normal limits. Multilevel neural foraminal narrowing is noted bilaterally. Soft tissues and spinal canal: Diffuse vascular calcifications are noted. No acute soft tissue abnormality is noted. Upper chest: Within normal limits Other: None IMPRESSION: CT of the head: Chronic atrophic and ischemic changes without acute abnormality. CT of the cervical spine: Multilevel degenerative change without acute abnormality. Electronically Signed   By: Inez Catalina M.D.   On: 04/13/2018 07:58   Dg Foot Complete Left  Result Date: 04/13/2018 CLINICAL DATA:  84 y/o F; fall this morning with left leg deformity. EXAM: LEFT TIBIA AND FIBULA - 2 VIEW; LEFT FEMUR 2 VIEWS; LEFT FOOT - COMPLETE 3+ VIEW COMPARISON:  01/03/2014 CT of the left knee. FINDINGS: Left femur: Acute comminuted transverse fracture of the lower femoral diaphysis just above the knee prosthesis with 1 shaft's width posterior displacement of the distal fracture component. The hip joint appears well maintained. Left tibia and fibula: Left distal femur fracture as above. Total knee prosthesis without periprosthetic lucency. Chronic fracture deformity of the patella. Vascular calcifications noted. Acute minimally displaced fracture of the medial malleolus. Acute oblique minimally displaced fracture of the lower fibula above level of tibial plafond. No ankle joint dislocation identified on these nonstress views. Possible minimally displaced fracture of the posterior  malleolus. Left foot: There is no evidence of fracture or other focal bone lesions the foot. Soft tissues are unremarkable. IMPRESSION: 1. Acute comminuted transverse fracture of the left lower femur diaphysis just above the knee prosthesis. One shaft's width posterior displacement of distal fracture component. 2. Acute minimally displaced fracture of left medial malleolus. 3. Acute minimally displaced fracture of the left lower fibula diaphysis above level of tibial plafond. 4. Possible minimally displaced acute fracture of posterior malleolus. 5. Chronic fracture deformity of the left patella. Electronically Signed   By: Kristine Garbe M.D.   On: 04/13/2018 06:46   Dg Femur Min 2 Views Left  Result Date: 04/13/2018 CLINICAL DATA:  84 y/o F; fall this morning with left leg deformity. EXAM: LEFT TIBIA AND FIBULA - 2 VIEW; LEFT FEMUR 2 VIEWS; LEFT FOOT - COMPLETE 3+ VIEW COMPARISON:  01/03/2014 CT of the left knee. FINDINGS: Left femur: Acute comminuted transverse fracture of the lower femoral diaphysis just above the knee prosthesis with 1 shaft's width posterior displacement of the distal fracture component. The hip joint appears well maintained. Left tibia and fibula: Left distal femur fracture as above. Total knee prosthesis without periprosthetic lucency. Chronic fracture deformity of the patella. Vascular calcifications noted. Acute minimally displaced fracture of the medial malleolus. Acute oblique minimally displaced fracture of the lower fibula above level of tibial plafond. No ankle joint dislocation identified on these nonstress views. Possible minimally displaced fracture of the posterior malleolus. Left foot: There is no evidence of  fracture or other focal bone lesions the foot. Soft tissues are unremarkable. IMPRESSION: 1. Acute comminuted transverse fracture of the left lower femur diaphysis just above the knee prosthesis. One shaft's width posterior displacement of distal fracture  component. 2. Acute minimally displaced fracture of left medial malleolus. 3. Acute minimally displaced fracture of the left lower fibula diaphysis above level of tibial plafond. 4. Possible minimally displaced acute fracture of posterior malleolus. 5. Chronic fracture deformity of the left patella. Electronically Signed   By: Kristine Garbe M.D.   On: 04/13/2018 06:46   Dg Hips Bilat W Or Wo Pelvis 3-4 Views  Result Date: 04/13/2018 CLINICAL DATA:  Recent fall with bilateral hip pain left greater than right EXAM: DG HIP (WITH OR WITHOUT PELVIS) 3-4V BILAT COMPARISON:  03/11/2018 FINDINGS: Prior right hip replacement is again identified and stable. The pelvic ring is intact. No acute fracture or dislocation is noted. No soft tissue changes are seen. IMPRESSION: No acute abnormality noted. Electronically Signed   By: Inez Catalina M.D.   On: 04/13/2018 08:14   Assessment and Plan:   1.Pre-operative cardiac clearance for surgical repair of left femur and left ankle s/p mechanical fall at ALF: -Pt presented to [facility] on 04/13/18 s/p mechanical fall at her ALF/ILF. It is unclear as to the events that led up to the fall. Imagining upon presentation revealed left femur and ankle fx. Orthopedic Medicine was consulted with plans to pursue ORIF tomorrow 04/14/18 pending surgical clearance from IM and Cards. -Patient with a history of permanent atrial fibrillation on chronic anticoagulation and mild to moderate aortic stenosis (last mean gradient at 46mHG) for which she is followed by Dr. HEllyn Hack She was last seen by him on 01/13/18 and it is reported that she was doing quite well with no recent falls at that point. Her AF was well controlled. Falls in the past thought to be related to her MS (legs giving out).  -It appears that she has another ED visit in 02/2018 for a fall and suffered a rib fracure  -There have been multiple telephone encounters regarding the patients Cardia dosing and use. Telephone  encounter from 04/13/18 states that her son thinks that her Cardia XT [id number] making her sleep too much -Pt revised cardiac risk index for pre-operative risk was calculated at 1, Class II risk indicating a 6% 30-day risk of death, MI, or cardiac arrest --(this is because of her history of TIA, however there is reduced risk overall because of her being on beta-blocker). Pt is at higher risk for surgery including advanced age and chronic anticoagulation however has no past history or current symptoms of ACS or CAD. Will discuss final recommendation with primary cardiologist.   2. Permanent atrial fibrillation: -Rate controlled with bisoprolol, diltiazem and anticoagulated with low dose Xarelto which is currently on hold for upcoming surgery. -TSH, WNL -[id number] =8  3. Mild to Moderate aortic stenosis: -Last echocardiogram 09/01/2014 with mild to moderate AS with a mean gradient of 1860m with a worsening of AS from 2011. There was also moderate MR noted.  -Plan to follow with serial echocardiograms unless symptomatic --would not be unreasonable to check an echo preop if there is time  4. Essential hypertension: -Stable, 136/78>142/126>136/79>128/75 -Continue bisoprolol 10-55m61mily, diltiazem 120mg66mly  -She was recently started on Maxzide>not continued inpatient   5. Dyslipidemia: -Elevated, LDL noted to be 143 on admission -Not currently on statin therapy and likely will not take per chart review  -Managed by PCP  For questions or updates, please contact [place] Please consult [email or web address] for contact info under Cardiology/STEMI.   SignedKathyrn Drown NP-C [facility] Pager: [phone] 04/13/2018 12:09 PM  Echo I have seen, examined and evaluated the patient this PM along with Kathyrn Drown, NP-C.  After reviewing all the available data and chart, we discussed the patients laboratory, study & physical findings as well as symptoms in detail. I agree with her findings,  examination as well as impression recommendations as per our discussion.    Kesi is a very pleasant 84 year old woman well-known history of permanent A. fib on low-dose Xarelto as well as combination of low-dose beta-blocker and diltiazem for rate control who presents here after mechanical fall with femur and ankle fracture.  We have been consulted for preoperative risk assessment.  She has no home CAD, angina or systolic heart failure symptoms.  She has simply A. fib.  Her Xarelto was already being held and should be fine after being held for 1-2 doses for her surgery. No increased risk with mild to moderate aortic stenosis.   She is extremely delirious and confused today showing worsening neurologic status possibly because of pain medications from what she usually is at baseline. From a cardiac risk standpoint she is essentially low to intermediate risk based on her having history of stroke/TIA in the past, but I would not recommend any further cardiac evaluation other than possibly an echocardiogram if there is time.  The presence of aortic stenosis would suggest that we would want to avoid excess volumes she has, however I do not think there is any need to delay her surgery from a cardiac standpoint.  In the past the patient and family have voiced an interesting to avoid continued evaluation of her cardiac condition stenosis.  However it would not be unreasonable to check while she is here to help manage.   We will be happy to follow along with you from a distance in case there are any potential cardiac concerns, but otherwise will sign off.    Glenetta Hew, M.D., M.S. Interventional Cardiologist   Pager # [phone] Phone # [phone] [address] [place], [place] [address]

## 2018-04-13 NOTE — ED Triage Notes (Signed)
Pt BIB EMS from [facility]. Pt was attempted to get up from her recliner when her knees gave out on her and she fell. EMS noted closed L tibia fx, splinted on scene. EMS gave pt fentanyl en route. Pt denies head, neck or back pain. Placed in towel rolls as precaution. Does take xarelto daily. Pt's baseline is CAO x3 with some difficulty with time orientation.

## 2018-04-13 NOTE — Anesthesia Preprocedure Evaluation (Addendum)
Anesthesia Evaluation  Patient identified by MRN, date of birth, ID band Patient awake    Reviewed: Allergy & Precautions, NPO status , Patient's Chart, lab work & pertinent test results  Airway Mallampati: II  TM Distance: >3 FB Neck ROM: Full    Dental  (+) Dental Advisory Given   Pulmonary neg pulmonary ROS,    breath sounds clear to auscultation       Cardiovascular hypertension, Pt. on medications and Pt. on home beta blockers + Peripheral Vascular Disease  + dysrhythmias Atrial Fibrillation + Valvular Problems/Murmurs AS  Rhythm:Regular Rate:Normal  Mild mod AS on 2016 echo. Normal EF   Neuro/Psych Dementia MS CVA    GI/Hepatic negative GI ROS, Neg liver ROS,   Endo/Other  diabetesHypothyroidism   Renal/GU Renal disease     Musculoskeletal   Abdominal   Peds  Hematology  (+) Blood dyscrasia, ,   Anesthesia Other Findings   Reproductive/Obstetrics                           Lab Results  Component Value Date   WBC 8.6 04/13/2018   HGB 13.9 04/13/2018   HCT 43.0 04/13/2018   MCV 96.2 04/13/2018   PLT 247 04/13/2018   Lab Results  Component Value Date   CREATININE 1.41 (H) 04/13/2018   BUN 20 04/13/2018   NA 140 04/13/2018   K 4.1 04/13/2018   CL 105 04/13/2018   CO2 27 04/13/2018   Lab Results  Component Value Date   INR 2.14 04/13/2018   INR 2.00 (H) 02/05/2015   INR 1.15 01/29/2015    Anesthesia Physical Anesthesia Plan  ASA: III  Anesthesia Plan: General   Post-op Pain Management:    Induction: Intravenous  PONV Risk Score and Plan: 3 and Dexamethasone, Ondansetron and Treatment may vary due to age or medical condition  Airway Management Planned: Oral ETT  Additional Equipment:   Intra-op Plan:   Post-operative Plan: Extubation in OR and Possible Post-op intubation/ventilation  Informed Consent: I have reviewed the patients History and Physical, chart,  labs and discussed the procedure including the risks, benefits and alternatives for the proposed anesthesia with the patient or authorized representative who has indicated his/her understanding and acceptance.   Dental advisory given  Plan Discussed with: CRNA  Anesthesia Plan Comments:        Anesthesia Quick Evaluation

## 2018-04-13 NOTE — ED Notes (Signed)
Pt alert and oriented x 4 ; pt refusing to put on gown and remove personal clothes , pt states  "Im 84 years old and I don't want surgery "

## 2018-04-13 NOTE — H&P (Addendum)
History and Physical    Makayla Thomas OJJ:[id number] DOB: Aug 28, 1925 DOA: 04/13/2018  PCP: Deland Pretty, MD Consultants:  Ellyn Hack - cardiology; Alvan Dame - orthopedics Patient coming from: [facility]; NOK: sons, Makayla Thomas, [phone]; Makayla Thomas, [phone]  Chief Complaint: fall  HPI: Makayla Thomas is a 84 y.o. female with medical history significant of TIA; afib on Xarelto; moderate AS; PAD; hypothyroidism; HTN;  MS; HLD; and DM presenting with tibial fracture.  She is unable to provide history due to her dementia.    HPI per PA Makayla Thomas: She has a past surgical history right total hip arthroplasty with revision after spiral fracture around the prosthesis. Left total knee arthroplasty performed by [facility]. Patient presents with left leg deformity. She states that she was getting up out of her recliner when her knees gave out and she fell to the floor. She has normal sensation and pulses in the left leg. She denies hitting her head or losing consciousness. Her daughter is at bedside and states that she lives in an assisted living facility and they recently had to hire a nurse to come in to help with medication changes due to poor short-term memory and some mild dementia.  She otherwise has a fairly good quality of life. The patient states that her pain is "not at screaming level right now." And is improved after IV pain medications. Complains of pain in her left leg and pain in her left groin  I spoke with her son Makayla Thomas).  He reports that she has been receiving all of her medications packaged today through the pharmacy.  The family realized recently that the package did not include some of her pills and she had not been taking bisoprolol and another unspecified medication for at least a week or more.  The pharmacy provided the packaged medications and 2 "loose pills" starting last week.   Her son saw her last week and she was doing fairly well.  He  thinks this fall might be related to resumption of bisoprolol.  In the last week, she has really gone downhill - he thinks it was related to this medication change.  As a result, the family called Dr. Allison Thomas office and he recommended stopping Cardizem instead of Bisoprolol - this has not happened yet.  At her 89th birthday party, they also discussed code status - but she would not commit.  Her sons decided together that she should be DNR.  After her prior hip replacement, post-anesthesia or with pain medication, she had serious hallucinations.  ED Course:   Mechanical fall today.  On Xarelto for afib, mild dementia.  Fall on left side - periprosthetic femur fracture, left bimalleolar fracture.  Family [place] route from out of state.  Orthopedics consulted, possible ORIF for both this afternoon.  Review of Systems:  Unable to perform - patient denied pain.   Past Medical History:  Diagnosis Date  . Anemia   . Aortic stenosis, moderate (was mild) 08/2009; 08/2014   a. ECHO  EF 55-60%, wth increased EDP, mild AS;; b. mild Conc LVH, EF 60-65%, DD with high LVEDP, MIld-Mod AS, Mod MAC with Mod MR, mild-mod PA HTN.  [name] Arthritis   . Complication of anesthesia    hallusinations  . Constipation   . Diabetes mellitus without complication ([place])    possibly  . Dyslipidemia    treated  . H/O cardiovascular stress test 05/2010   Lexiscan cardiolite no ischemia or infarction  . H/O multiple sclerosis  no excaerbations in some time  . History of ETT 02/2011   [place] exercise protocol, negative with poor exercise tolerance  . History of transfusion   . Hypertension   . Hypothyroidism   . Lower extremity edema    chronic  . PAD (peripheral artery disease) ([place]) 08/30/2009   Dopplers; with bil. post. tib artery occlusion  . Permanent atrial fibrillation (HCC)    on Xarelto  . Transient ischemic attack (TIA)    NO RESIDUAL PROBLEMS  . Vaginal itching   . Visual disturbances    "FLASHING IN MY EYES"     Past Surgical History:  Procedure Laterality Date  . BREAST SURGERY     L tumor removed - benign  . CESAREAN SECTION     hx of 3  . CHOLECYSTECTOMY    . JOINT REPLACEMENT  2012   left total knee  . ORIF PERIPROSTHETIC FRACTURE Right 02/07/2015   Procedure: OPEN REDUCTION INTERNAL FIXATION (ORIF) PERIPROSTHETIC FRACTURE WITH TOTAL HIP REVISION AND CABLES;  Surgeon: Makayla Cancel, MD;  Location: [facility];  Service: Orthopedics;  Laterality: Right;  . throidectomy partial     lt  . TOTAL HIP ARTHROPLASTY Right 01/29/2015   Procedure: RIGHT TOTAL HIP ARTHROPLASTY ANTERIOR APPROACH;  Surgeon: Makayla Cancel, MD;  Location: [facility];  Service: Orthopedics;  Laterality: Right;    Social History   Socioeconomic History  . Marital status: Widowed    Spouse name: Not on file  . Number of children: 3  . Years of education: HS  . Highest education level: Not on file  Occupational History  . Occupation: Retired  Scientific laboratory technician  . Financial resource strain: Not on file  . Food insecurity:    Worry: Not on file    Inability: Not on file  . Transportation needs:    Medical: Not on file    Non-medical: Not on file  Tobacco Use  . Smoking status: Never Smoker  . Smokeless tobacco: Never Used  Substance and Sexual Activity  . Alcohol use: No    Alcohol/week: 0.0 standard drinks  . Drug use: No    Types: Fentanyl  . Sexual activity: Not on file  Lifestyle  . Physical activity:    Days per week: Not on file    Minutes per session: Not on file  . Stress: Not on file  Relationships  . Social connections:    Talks on phone: Not on file    Gets together: Not on file    Attends religious service: Not on file    Active member of club or organization: Not on file    Attends meetings of clubs or organizations: Not on file    Relationship status: Not on file  . Intimate partner violence:    Fear of current or ex partner: Not on file    Emotionally abused: Not on file    Physically abused:  Not on file    Forced sexual activity: Not on file  Other Topics Concern  . Not on file  Social History Narrative   Uses a walker to walk   Drinks 1-2 cups of coffee a day     Allergies  Allergen Reactions  . Tetanus Antitoxin Anaphylaxis    Throat swelling  . Augmentin [Amoxicillin-Pot Clavulanate] Other (See Comments)    unknown  . Codeine Other (See Comments)    unknown  . Livalo [Pitavastatin] Other (See Comments)    unknown  . Meclizine Other (See Comments)  Had funny feeling  . Tramadol Other (See Comments)    hallucinations    Family History  Problem Relation Age of Onset  . Heart failure Father 71  . Lung cancer Father     Prior to Admission medications   Medication Sig Start Date End Date Taking? Authorizing Provider  bisoprolol (ZEBETA) 10 MG tablet Take 1 tablet (10 mg total) by mouth daily. BISOPROLOL 1/2 TABDAILY ,IF PALPITION INCREASE OK TO TAKE ANOTHER 1/2 TABLET. Patient taking differently: Take 10-15 mg by mouth daily.  02/23/17  Yes Leonie Man, MD  CARTIA XT 120 MG 24 hr capsule TAKE ONE CAPSULE BY MOUTH AT BEDTIME Patient taking differently: Take 120 mg by mouth at bedtime.  01/22/17  Yes Leonie Man, MD  cholecalciferol (VITAMIN D) 1000 UNITS tablet Take 1,000 Units by mouth every morning.    Yes [provider]  Cranberry 500 MG TABS Take 250 mg by mouth daily.    Yes [provider]  levothyroxine (SYNTHROID, LEVOTHROID) 125 MCG tablet Take 125 mcg by mouth daily before breakfast.   Yes [provider]  Rivaroxaban (XARELTO) 15 MG TABS tablet Take 1 tablet (15 mg total) by mouth daily with breakfast. Patient taking differently: Take 15 mg by mouth daily with supper.  02/12/15  Yes Hosie Poisson, MD  solifenacin (VESICARE) 5 MG tablet Take 10 mg by mouth daily. 02/08/18  Yes [provider]  triamterene-hydrochlorothiazide (MAXZIDE-25) 37.5-25 MG per tablet Take 0.5 tablets by mouth daily.    Yes [provider]  trimethoprim (TRIMPEX) 100 MG tablet Take 100 mg by mouth daily.  01/06/18  Yes [provider]    Physical Exam: Vitals:   04/13/18 0810 04/13/18 0915 04/13/18 0930 04/13/18 0945  BP: 125/70 128/75 136/79 (!) 142/126  Pulse: 63 68 69 76  Resp: 20 16 14 12   Temp:      TempSrc:      SpO2: 98% 97% 94% 92%  Weight:      Height:         General:  Appears calm and comfortable and is NAD; as is typical of many patients with dementia, she was fidgeting with her tubes including [place] O2, pulse ox sensor, and IV tubing Eyes:  PERRL, EOMI, normal lids, iris ENT:  grossly normal hearing, lips & tongue, mmm Neck:  no LAD, masses or thyromegaly Cardiovascular:  Afib, no r/g. No LE edema.  Respiratory:   CTA bilaterally with no wheezes/rales/rhonchi.  Normal respiratory effort. Abdomen:  soft, NT, ND, NABS Skin:  no rash or induration seen on limited exam; there was already ecchymosis developing on her left ankle Musculoskeletal: left hip shortened and externally rotated with deformity and bruising on the left ankle; knee immobilizer was placed Psychiatric: grossly normal mood and affect, speech slowed and mildly dysarthric , AO x 2 (knows name, reports that she is "here" and then was able to further specify that she is in [place]; was able to eventually determine that it was 2019 Neurologic: unable to perform   Radiological Exams on Admission: Dg Chest 1 View  Result Date: 04/13/2018 CLINICAL DATA:  84 year old female with a history of fall and a left leg deformity EXAM: CHEST  1 VIEW COMPARISON:  03/11/2018, 06/27/2017 FINDINGS: Cardiomediastinal silhouette unchanged in size and contour with cardiomegaly. Right rotation somewhat accentuates the heart borders. Calcifications of the aortic arch. Low lung volumes. Coarsened interstitial markings, similar to prior. No confluent airspace disease, pneumothorax, or pleural effusion. Degenerative changes  of the spine IMPRESSION:  Chronic lung changes and likely atelectasis with no evidence of acute cardiopulmonary disease. Cardiomegaly Electronically Signed   By: Corrie Mckusick D.O.   On: 04/13/2018 07:00   Dg Tibia/fibula Left  Result Date: 04/13/2018 CLINICAL DATA:  84 y/o F; fall this morning with left leg deformity. EXAM: LEFT TIBIA AND FIBULA - 2 VIEW; LEFT FEMUR 2 VIEWS; LEFT FOOT - COMPLETE 3+ VIEW COMPARISON:  01/03/2014 CT of the left knee. FINDINGS: Left femur: Acute comminuted transverse fracture of the lower femoral diaphysis just above the knee prosthesis with 1 shaft's width posterior displacement of the distal fracture component. The hip joint appears well maintained. Left tibia and fibula: Left distal femur fracture as above. Total knee prosthesis without periprosthetic lucency. Chronic fracture deformity of the patella. Vascular calcifications noted. Acute minimally displaced fracture of the medial malleolus. Acute oblique minimally displaced fracture of the lower fibula above level of tibial plafond. No ankle joint dislocation identified on these nonstress views. Possible minimally displaced fracture of the posterior malleolus. Left foot: There is no evidence of fracture or other focal bone lesions the foot. Soft tissues are unremarkable. IMPRESSION: 1. Acute comminuted transverse fracture of the left lower femur diaphysis just above the knee prosthesis. One shaft's width posterior displacement of distal fracture component. 2. Acute minimally displaced fracture of left medial malleolus. 3. Acute minimally displaced fracture of the left lower fibula diaphysis above level of tibial plafond. 4. Possible minimally displaced acute fracture of posterior malleolus. 5. Chronic fracture deformity of the left patella. Electronically Signed   By: Kristine Garbe M.D.   On: 04/13/2018 06:46   Dg Ankle 2 Views Left  Result Date: 04/13/2018 CLINICAL DATA:  Recent fall with left ankle pain and deformity, initial encounter  EXAM: LEFT ANKLE - 2 VIEW COMPARISON:  Films from earlier in the same day. FINDINGS: Oblique fracture through the distal fibular metaphysis is noted. Additionally a transverse fracture through the medial malleolus is seen. Slight angulation laterally at the fracture site is noted. Soft tissue swelling is seen. No other fracture is identified. IMPRESSION: Distal tibial and fibular fractures as described. No definitive posterior malleolar fracture is seen. Electronically Signed   By: Inez Catalina M.D.   On: 04/13/2018 08:16   Ct Head Wo Contrast  Result Date: 04/13/2018 CLINICAL DATA:  Recent fall with head injury EXAM: CT HEAD WITHOUT CONTRAST CT CERVICAL SPINE WITHOUT CONTRAST TECHNIQUE: Multidetector CT imaging of the head and cervical spine was performed following the standard protocol without intravenous contrast. Multiplanar CT image reconstructions of the cervical spine were also generated. COMPARISON:  03/11/2018 FINDINGS: CT HEAD FINDINGS Brain: Chronic atrophic changes and chronic white matter ischemic changes again noted and stable. No findings to suggest acute hemorrhage, acute infarction space-occupying lesion seen. Vascular: No hyperdense vessel or unexpected calcification. Skull: Normal. Negative for fracture or focal lesion. Sinuses/Orbits: No acute finding. Other: None. CT CERVICAL SPINE FINDINGS Alignment: Within normal limits. Skull base and vertebrae: 7 cervical segments are well visualized. Vertebral body height is well maintained. Multilevel disc space narrowing is noted from C3-C7. Mild osteophytic changes and facet hypertrophic changes are noted. No findings to suggest acute fracture or facet abnormality seen. The odontoid is within normal limits. Multilevel neural foraminal narrowing is noted bilaterally. Soft tissues and spinal canal: Diffuse vascular calcifications are noted. No acute soft tissue abnormality is noted. Upper chest: Within normal limits Other: None IMPRESSION: CT of the  head: Chronic atrophic and ischemic changes without acute  abnormality. CT of the cervical spine: Multilevel degenerative change without acute abnormality. Electronically Signed   By: Inez Catalina M.D.   On: 04/13/2018 07:58   Ct Cervical Spine Wo Contrast  Result Date: 04/13/2018 CLINICAL DATA:  Recent fall with head injury EXAM: CT HEAD WITHOUT CONTRAST CT CERVICAL SPINE WITHOUT CONTRAST TECHNIQUE: Multidetector CT imaging of the head and cervical spine was performed following the standard protocol without intravenous contrast. Multiplanar CT image reconstructions of the cervical spine were also generated. COMPARISON:  03/11/2018 FINDINGS: CT HEAD FINDINGS Brain: Chronic atrophic changes and chronic white matter ischemic changes again noted and stable. No findings to suggest acute hemorrhage, acute infarction space-occupying lesion seen. Vascular: No hyperdense vessel or unexpected calcification. Skull: Normal. Negative for fracture or focal lesion. Sinuses/Orbits: No acute finding. Other: None. CT CERVICAL SPINE FINDINGS Alignment: Within normal limits. Skull base and vertebrae: 7 cervical segments are well visualized. Vertebral body height is well maintained. Multilevel disc space narrowing is noted from C3-C7. Mild osteophytic changes and facet hypertrophic changes are noted. No findings to suggest acute fracture or facet abnormality seen. The odontoid is within normal limits. Multilevel neural foraminal narrowing is noted bilaterally. Soft tissues and spinal canal: Diffuse vascular calcifications are noted. No acute soft tissue abnormality is noted. Upper chest: Within normal limits Other: None IMPRESSION: CT of the head: Chronic atrophic and ischemic changes without acute abnormality. CT of the cervical spine: Multilevel degenerative change without acute abnormality. Electronically Signed   By: Inez Catalina M.D.   On: 04/13/2018 07:58   Dg Foot Complete Left  Result Date: 04/13/2018 CLINICAL DATA:  84  y/o F; fall this morning with left leg deformity. EXAM: LEFT TIBIA AND FIBULA - 2 VIEW; LEFT FEMUR 2 VIEWS; LEFT FOOT - COMPLETE 3+ VIEW COMPARISON:  01/03/2014 CT of the left knee. FINDINGS: Left femur: Acute comminuted transverse fracture of the lower femoral diaphysis just above the knee prosthesis with 1 shaft's width posterior displacement of the distal fracture component. The hip joint appears well maintained. Left tibia and fibula: Left distal femur fracture as above. Total knee prosthesis without periprosthetic lucency. Chronic fracture deformity of the patella. Vascular calcifications noted. Acute minimally displaced fracture of the medial malleolus. Acute oblique minimally displaced fracture of the lower fibula above level of tibial plafond. No ankle joint dislocation identified on these nonstress views. Possible minimally displaced fracture of the posterior malleolus. Left foot: There is no evidence of fracture or other focal bone lesions the foot. Soft tissues are unremarkable. IMPRESSION: 1. Acute comminuted transverse fracture of the left lower femur diaphysis just above the knee prosthesis. One shaft's width posterior displacement of distal fracture component. 2. Acute minimally displaced fracture of left medial malleolus. 3. Acute minimally displaced fracture of the left lower fibula diaphysis above level of tibial plafond. 4. Possible minimally displaced acute fracture of posterior malleolus. 5. Chronic fracture deformity of the left patella. Electronically Signed   By: Kristine Garbe M.D.   On: 04/13/2018 06:46   Dg Femur Min 2 Views Left  Result Date: 04/13/2018 CLINICAL DATA:  84 y/o F; fall this morning with left leg deformity. EXAM: LEFT TIBIA AND FIBULA - 2 VIEW; LEFT FEMUR 2 VIEWS; LEFT FOOT - COMPLETE 3+ VIEW COMPARISON:  01/03/2014 CT of the left knee. FINDINGS: Left femur: Acute comminuted transverse fracture of the lower femoral diaphysis just above the knee prosthesis with  1 shaft's width posterior displacement of the distal fracture component. The hip joint appears well maintained. Left  tibia and fibula: Left distal femur fracture as above. Total knee prosthesis without periprosthetic lucency. Chronic fracture deformity of the patella. Vascular calcifications noted. Acute minimally displaced fracture of the medial malleolus. Acute oblique minimally displaced fracture of the lower fibula above level of tibial plafond. No ankle joint dislocation identified on these nonstress views. Possible minimally displaced fracture of the posterior malleolus. Left foot: There is no evidence of fracture or other focal bone lesions the foot. Soft tissues are unremarkable. IMPRESSION: 1. Acute comminuted transverse fracture of the left lower femur diaphysis just above the knee prosthesis. One shaft's width posterior displacement of distal fracture component. 2. Acute minimally displaced fracture of left medial malleolus. 3. Acute minimally displaced fracture of the left lower fibula diaphysis above level of tibial plafond. 4. Possible minimally displaced acute fracture of posterior malleolus. 5. Chronic fracture deformity of the left patella. Electronically Signed   By: Kristine Garbe M.D.   On: 04/13/2018 06:46   Dg Hips Bilat W Or Wo Pelvis 3-4 Views  Result Date: 04/13/2018 CLINICAL DATA:  Recent fall with bilateral hip pain left greater than right EXAM: DG HIP (WITH OR WITHOUT PELVIS) 3-4V BILAT COMPARISON:  03/11/2018 FINDINGS: Prior right hip replacement is again identified and stable. The pelvic ring is intact. No acute fracture or dislocation is noted. No soft tissue changes are seen. IMPRESSION: No acute abnormality noted. Electronically Signed   By: Inez Catalina M.D.   On: 04/13/2018 08:14    EKG: Independently reviewed.  Afib with rate 73; nonspecific ST changes with no evidence of acute ischemia   Labs on Admission: I have personally reviewed the available labs and  imaging studies at the time of the admission.  Pertinent labs:   Glucose 147 BUN 20/Creatinine 1.41/GFR 37 - stable from 11/18 Normal CBC INR 2.17 Type and cross done  Assessment/Plan Principal Problem:   Peri-prosthetic fracture of femur at tip of prosthesis Active Problems:   Permanent atrial fibrillation (HCC); CHA2DS2-VASc Score 6   Chronic anticoagulation   Dyslipidemia, goal LDL below 100 - due aortic stenosis   Essential hypertension -well controlled   Hypothyroidism   Trimalleolar fracture of ankle, closed, left, initial encounter   CKD (chronic kidney disease) stage 3, GFR 30-59 ml/min (HCC)   Hyperglycemia   Dementia   Left hip and trimalleolar ankle fractures -Mechanical fall resulting in hip/ankle fractures -Son is concerned that this fall may have been precipitated by medications (see below), although this is not clear at this time -Orthopedics consult  -NPO in anticipation of surgical repair  -She took Xarelto last night - will plan to start SQ Heparin tomorrow AM -CXR and EKG done -Pain control with Robxain, Vicodin, and Morphine prn -SW consult for rehab placement -Will need PT consult post-operatively -Hip fracture order set utilized  Pre-operative clearance -Orthopedic/spinal surgery is associated with an intermediate (1-5%) cardiovascular risk for cardiac death and nonfatal MI -With her h/oTIA, her revised cardiac index gives a risk estimate of 6.0% -Because of this risk, she is recommended to have pre-operative EKG testing prior to surgery; this was done in the ER -Her Detsky's Modified Cardiac Risk Index score is Class II, with a 7% cardiac risk -Given her h/o AS and PVD, will request formal cardiology consult for surgical clearance  Afib on Xarelto -Family is concerned that both Cardizem and Bisoprolol may cause hypotension and increase risk for falls -Will continue both medications for now but will monitor BP and suggest orthostatics prior to d/c -  and will request cardiology input on this issue, as well -Rate controlled at this time -She is on Xarelto which may lead to a delay in her surgery - although according to the ortho note, the current plan is to operate through it this afternoon if cleared -Her last dose was last night -Her creatinine clearance indicates that Xarelto may not be the best option for her - and with her fall today, ongoing risks/benefits of ongoing anticoagulation needs to be considered -For now, will hold Xarelto either way and start SQ heparin tomorrow AM  Dementia -Family reports recent progression, possibly resulting from medications -She does appear to have at least moderate dementia on my evaluation today without obvious delirium -Consider medication changes, as above -Family counseled about how patients with dementia often do better behaviorally while hospitalized if family members are present overnight -She has had issues with hallucinations with pain medications in the past, but will need pain control as a priority - will monitor, may need telesitter  HTN -Continue bisoprolol, diltiazem for now -Hold and consider d/c of Maxzide based on her creatinine clearance  HLD -Does not appear to be taking medications for this -LDL goal is <100 due to AS -LDL in 2016 was 19 -Will check lipid panel  Hypothyroidism -Check TSH and free T4 -Continue Synthroid at current dose for now  CKD -Appears to be stable at this time -Will follow  Hyperglycemia -May be stress response -A1c in 2019 was 6.4 -Will follow with fasting AM labs -It is unlikely that she will need acute or chronic treatment for this issue  DVT prophylaxis:  Heparin starting tomorrow, received Xarelto last night Code Status:  DNR - confirmed with family Family Communication: None present; spoke with son by telephone  Disposition Plan:  SNF once clinically improved Consults called: Orthopedics; SW, Nutrition; will need PT post-operatively    Admission status: Admit - It is my clinical opinion that admission to [place] is reasonable and necessary because of the expectation that this patient will require hospital care that crosses at least 2 midnights to treat this condition based on the medical complexity of the problems presented.  Given the aforementioned information, the predictability of an adverse outcome is felt to be significant.    Makayla Bongo MD [facility]  If note is complete, please contact covering daytime or nighttime physician. [email or web address] Password [facility]  04/13/2018, 10:36 AM

## 2018-04-13 NOTE — ED Provider Notes (Signed)
[facility] EMERGENCY DEPARTMENT Provider Note   CSN: [id number] Arrival date & time: 04/13/18  0626     History   Chief Complaint Chief Complaint  Patient presents with  . Fall  . Leg Injury    HPI Elmer Boutelle is a 84 y.o. female who is a past medical history of diabetes, chronic anticoagulation on Xarelto with a history of permanent atrial fibrillation, peripheral arterial disease, mild aortic stenosis, anemia, and hypertension.  She has a past surgical history right total hip arthroplasty with revision after spiral fracture around the prosthesis.  Left total knee arthroplasty performed by [facility].  Patient presents with left leg deformity.  She states that she was getting up out of her recliner when her knees gave out and she fell to the floor.  She has normal sensation and pulses in the left leg.  She denies hitting her head or losing consciousness.  Her daughter is at bedside and states that she lives in an assisted living facility and they recently had to hire a nurse to come in to help with medication changes due to poor short-term memory and some mild dementia. She otherwise has a fairly good quality of life.  The patient states that her pain is "not at screaming level right now."  And is improved after IV pain medications.  Complains of pain in her left leg and pain in her left groin HPI  Past Medical History:  Diagnosis Date  . Anemia   . Aortic stenosis, moderate (was mild) 08/2009; 08/2014   a. ECHO  EF 55-60%, wth increased EDP, mild AS;; b. mild Conc LVH, EF 60-65%, DD with high LVEDP, MIld-Mod AS, Mod MAC with Mod MR, mild-mod PA HTN.  [name] Arthritis   . Borderline diabetes   . Chronic anticoagulation    on Xarelto  . Complication of anesthesia    hallusinations  . Constipation   . Diabetes mellitus without complication ([place])    possibly  . Dyslipidemia    treated  . Dysrhythmia    A-FIB  . H/O cardiovascular stress test 05/2010   Lexiscan cardiolite no ischemia or infarction  . H/O multiple sclerosis    no excaerbations in some time  . History of ETT 02/2011   [place] exercise protocol, negative with poor exercise tolerance  . History of transfusion   . Hypertension   . Hypothyroidism   . Lower extremity edema    chronic  . PAD (peripheral artery disease) ([place]) 08/30/2009   Dopplers; with bil. post. tib artery occlusion  . Permanent atrial fibrillation ([place])   . Transient ischemic attack (TIA)    NO RESIDUAL PROBLEMS  . Vaginal itching   . Visual disturbances    "FLASHING IN MY EYES"    Patient Active Problem List   Diagnosis Date Noted  . Periprosthetic fracture around internal prosthetic right hip joint ([place]) 02/05/2015  . Hypothyroidism 02/05/2015  . Obese 01/30/2015  . S/P right THA, AA 01/29/2015  . Rectal bleeding   . Anticoagulation adequate   . Stroke ([place])   . Dehydration 08/29/2014  . Nausea and vomiting 08/29/2014  . Diarrhea 08/29/2014  . Hypokalemia 08/29/2014  . Acute renal failure ([place]) 08/29/2014  . Leukocytosis 08/29/2014  . History of TIA (transient ischemic attack) 08/18/2014  . Essential hypertension -well controlled 01/10/2013  . Permanent atrial fibrillation ([place]); CHA2DS2-VASc Score 6   . Chronic anticoagulation   . Dyslipidemia, goal LDL below 100 - due aortic stenosis   .  Moderate aortic stenosis 08/17/2009    Past Surgical History:  Procedure Laterality Date  . BREAST SURGERY     L tumor removed - benign  . CESAREAN SECTION     hx of 3  . CHOLECYSTECTOMY    . JOINT REPLACEMENT  2012   left total knee  . ORIF PERIPROSTHETIC FRACTURE Right 02/07/2015   Procedure: OPEN REDUCTION INTERNAL FIXATION (ORIF) PERIPROSTHETIC FRACTURE WITH TOTAL HIP REVISION AND CABLES;  Surgeon: Paralee Cancel, MD;  Location: [facility];  Service: Orthopedics;  Laterality: Right;  . throidectomy partial     lt  . TOTAL HIP ARTHROPLASTY Right 01/29/2015   Procedure: RIGHT TOTAL HIP ARTHROPLASTY  ANTERIOR APPROACH;  Surgeon: Paralee Cancel, MD;  Location: [facility];  Service: Orthopedics;  Laterality: Right;     OB History   None      Home Medications    Prior to Admission medications   Medication Sig Start Date End Date Taking? Authorizing Provider  bisoprolol (ZEBETA) 10 MG tablet Take 1 tablet (10 mg total) by mouth daily. BISOPROLOL 1/2 TABDAILY ,IF PALPITION INCREASE OK TO TAKE ANOTHER 1/2 TABLET. Patient taking differently: Take 10-15 mg by mouth daily.  02/23/17  Yes Leonie Man, MD  CARTIA XT 120 MG 24 hr capsule TAKE ONE CAPSULE BY MOUTH AT BEDTIME Patient taking differently: Take 120 mg by mouth at bedtime.  01/22/17  Yes Leonie Man, MD  cholecalciferol (VITAMIN D) 1000 UNITS tablet Take 1,000 Units by mouth every morning.    Yes [provider]  Cranberry 500 MG TABS Take 250 mg by mouth daily.    Yes [provider]  levothyroxine (SYNTHROID, LEVOTHROID) 125 MCG tablet Take 125 mcg by mouth daily before breakfast.   Yes [provider]  Rivaroxaban (XARELTO) 15 MG TABS tablet Take 1 tablet (15 mg total) by mouth daily with breakfast. Patient taking differently: Take 15 mg by mouth daily with supper.  02/12/15  Yes Hosie Poisson, MD  solifenacin (VESICARE) 5 MG tablet Take 10 mg by mouth daily. 02/08/18  Yes [provider]  triamterene-hydrochlorothiazide (MAXZIDE-25) 37.5-25 MG per tablet Take 0.5 tablets by mouth daily.    Yes [provider]  trimethoprim (TRIMPEX) 100 MG tablet Take 100 mg by mouth daily.  01/06/18  Yes [provider]    Family History Family History  Problem Relation Age of Onset  . Heart failure Father 70  . Lung cancer Father     Social History Social History   Tobacco Use  . Smoking status: Never Smoker  . Smokeless tobacco: Never Used  Substance Use Topics  . Alcohol use: No    Alcohol/week: 0.0 standard drinks  . Drug use: No    Types: Fentanyl     Allergies   Tetanus  antitoxin; Augmentin [amoxicillin-pot clavulanate]; Codeine; Livalo [pitavastatin]; Meclizine; and Tramadol   Review of Systems Review of Systems Ten systems reviewed and are negative for acute change, except as noted in the HPI.    Physical Exam Updated Vital Signs BP (!) 146/68 (BP Location: Right Arm)   Pulse 60   Temp 98.6 F (37 C) (Oral)   Resp 14   Ht _0  (1.575 m)   Wt 75 kg   SpO2 98%   BMI 30.24 kg/m   Physical Exam  Constitutional: She is oriented to person, place, and time. She appears well-developed and well-nourished. No distress.  HENT:  Head: Normocephalic and atraumatic.  Eyes: Conjunctivae are normal. No scleral  icterus.  Neck: Normal range of motion.  C-spine precautions with soft towel  Cardiovascular: Normal rate, regular rhythm and normal heart sounds. Exam reveals no gallop and no friction rub.  No murmur heard. Pulmonary/Chest: Effort normal and breath sounds normal. No respiratory distress.  Abdominal: Soft. Bowel sounds are normal. She exhibits no distension and no mass. There is no tenderness. There is no guarding.  Musculoskeletal: She exhibits edema and deformity.  Left leg with obvious deformity.  She has a palpable pulses in the foot.  Complaining of pain in the left groin.  She is neurovascularly intact and examined in splint.  Neurological: She is alert and oriented to person, place, and time.  Skin: Skin is warm and dry. She is not diaphoretic.  Psychiatric: Her behavior is normal.  Nursing note and vitals reviewed.    ED Treatments / Results  Labs (all labs ordered are listed, but only abnormal results are displayed) Labs Reviewed  BASIC METABOLIC PANEL  CBC WITH DIFFERENTIAL/PLATELET  URINALYSIS, ROUTINE W REFLEX MICROSCOPIC  PROTIME-INR  TYPE AND SCREEN    EKG None  Radiology No results found.  Procedures Procedures (including critical care time)  Medications Ordered in ED Medications  ondansetron (ZOFRAN) injection  4 mg (has no administration in time range)  HYDROmorphone (DILAUDID) injection 0.5 mg (has no administration in time range)  ondansetron (ZOFRAN) injection 4 mg (4 mg Intravenous Given by Other 04/13/18 2979)     Initial Impression / Assessment and Plan / ED Course  I have reviewed the triage vital signs and the nursing notes.  Pertinent labs & imaging results that were available during my care of the patient were reviewed by me and considered in my medical decision making (see chart for details).     This is a 84 year old female with mechanical injury resulting in multiple fractures of the left lower extremity.  Patient seen and shared visit with Dr. Vanita Panda.  Orthopedics has consulted on the patient.  She was placed in a knee immobilizer.  She has had good pulses and sensation throughout.  Her family was informed of the diagnoses at bedside.  She will be admitted by the hospitalist service.  Patient's labs and imaging have been reviewed.  She has no significant abnormalities.  Final Clinical Impressions(s) / ED Diagnoses   Final diagnoses:  Fall    ED Discharge Orders    None       Margarita Mail, PA-C 04/13/18 1604    Carmin Muskrat, MD 04/14/18 2036

## 2018-04-13 NOTE — Telephone Encounter (Signed)
New Message:    Pt c/o medication issue:  1. Name of Medication: CARTIA XT 120 MG 24 hr capsule  2. How are you currently taking this medication (dosage and times per day)? [id number] - [place], [place] - [address]  3. Are you having a reaction (difficulty breathing--STAT)? No   4. What is your medication issue? Pt son states the medication is causing his mother to sleep too much

## 2018-04-13 NOTE — Telephone Encounter (Signed)
Called pt son back and he advised me that the pt had fallen this afternoon and is in the hospital with a broken leg and ankle. I expressed our compassion and sorry to hear what she is going through and reassured him that they will address her meds while in the hospital but to please let us know if there is anything we can do for him and her.

## 2018-04-14 ENCOUNTER — Inpatient Hospital Stay (HOSPITAL_COMMUNITY): Payer: Medicare Other

## 2018-04-14 ENCOUNTER — Inpatient Hospital Stay (HOSPITAL_COMMUNITY): Payer: Medicare Other | Admitting: Anesthesiology

## 2018-04-14 ENCOUNTER — Encounter (HOSPITAL_COMMUNITY): Payer: Self-pay | Admitting: Certified Registered Nurse Anesthetist

## 2018-04-14 ENCOUNTER — Encounter (HOSPITAL_COMMUNITY)
Admission: EM | Disposition: A | Discharge status: Skilled Nursing Facility | Payer: Self-pay | Source: Home / Self Care | Attending: Internal Medicine

## 2018-04-14 HISTORY — PX: ORIF ANKLE FRACTURE: SHX5408

## 2018-04-14 HISTORY — PX: ORIF FEMUR FRACTURE: SHX2119

## 2018-04-14 LAB — CBC
HEMATOCRIT: 38.3 % (ref 36.0–46.0)
Hemoglobin: 12.5 g/dL (ref 12.0–15.0)
MCH: 30.9 pg (ref 26.0–34.0)
MCHC: 32.6 g/dL (ref 30.0–36.0)
MCV: 94.6 fL (ref 78.0–100.0)
Platelets: 249 10*3/uL (ref 150–400)
RBC: 4.05 MIL/uL (ref 3.87–5.11)
RDW: 13.5 % (ref 11.5–15.5)
WBC: 14.1 10*3/uL — AB (ref 4.0–10.5)

## 2018-04-14 LAB — GLUCOSE, CAPILLARY: GLUCOSE-CAPILLARY: 149 mg/dL — AB (ref 70–99)

## 2018-04-14 SURGERY — OPEN REDUCTION INTERNAL FIXATION (ORIF) ANKLE FRACTURE
Anesthesia: General | Site: Leg Upper | Laterality: Left

## 2018-04-14 MED ORDER — LIDOCAINE 2% (20 MG/ML) 5 ML SYRINGE
INTRAMUSCULAR | Status: AC
  Filled 2018-04-14: qty 5

## 2018-04-14 MED ORDER — PROPOFOL 10 MG/ML IV BOLUS
INTRAVENOUS | Status: DC | PRN
  Administered 2018-04-14: 80 mg via INTRAVENOUS

## 2018-04-14 MED ORDER — VANCOMYCIN HCL 1000 MG IV SOLR
INTRAVENOUS | Status: DC | PRN
  Administered 2018-04-14: 2000 mg via TOPICAL

## 2018-04-14 MED ORDER — FENTANYL CITRATE (PF) 250 MCG/5ML IJ SOLN
INTRAMUSCULAR | Status: AC
  Filled 2018-04-14: qty 5

## 2018-04-14 MED ORDER — CEFAZOLIN SODIUM-DEXTROSE 2-4 GM/100ML-% IV SOLN
2.0000 g | Freq: Two times a day (BID) | INTRAVENOUS | Status: AC
  Administered 2018-04-14 (×2): 2 g via INTRAVENOUS
  Filled 2018-04-14 (×2): qty 100

## 2018-04-14 MED ORDER — BISOPROLOL FUMARATE 10 MG PO TABS: 10.0000 mg | ORAL_TABLET | Freq: Every day | ORAL | Status: DC

## 2018-04-14 MED ORDER — DEXAMETHASONE SODIUM PHOSPHATE 10 MG/ML IJ SOLN
INTRAMUSCULAR | Status: AC
  Filled 2018-04-14: qty 1

## 2018-04-14 MED ORDER — CEFAZOLIN SODIUM-DEXTROSE 2-3 GM-%(50ML) IV SOLR
INTRAVENOUS | Status: DC | PRN
  Administered 2018-04-14: 2 g via INTRAVENOUS

## 2018-04-14 MED ORDER — ROCURONIUM BROMIDE 50 MG/5ML IV SOSY
PREFILLED_SYRINGE | INTRAVENOUS | Status: AC
  Filled 2018-04-14: qty 5

## 2018-04-14 MED ORDER — BISOPROLOL FUMARATE 10 MG PO TABS: 5.0000 mg | ORAL_TABLET | Freq: Every day | ORAL | Status: DC

## 2018-04-14 MED ORDER — LIDOCAINE 2% (20 MG/ML) 5 ML SYRINGE
INTRAMUSCULAR | Status: DC | PRN
  Administered 2018-04-14: 60 mg via INTRAVENOUS

## 2018-04-14 MED ORDER — PHENYLEPHRINE 40 MCG/ML (10ML) SYRINGE FOR IV PUSH (FOR BLOOD PRESSURE SUPPORT)
PREFILLED_SYRINGE | INTRAVENOUS | Status: AC
  Filled 2018-04-14: qty 10

## 2018-04-14 MED ORDER — ROCURONIUM BROMIDE 50 MG/5ML IV SOSY
PREFILLED_SYRINGE | INTRAVENOUS | Status: DC | PRN
  Administered 2018-04-14: 40 mg via INTRAVENOUS
  Administered 2018-04-14: 10 mg via INTRAVENOUS
  Administered 2018-04-14: 20 mg via INTRAVENOUS

## 2018-04-14 MED ORDER — 0.9 % SODIUM CHLORIDE (POUR BTL) OPTIME
TOPICAL | Status: DC | PRN
  Administered 2018-04-14: 1000 mL

## 2018-04-14 MED ORDER — BACITRACIN 500 UNIT/GM EX OINT
TOPICAL_OINTMENT | CUTANEOUS | Status: DC | PRN
  Administered 2018-04-14: 1 via TOPICAL

## 2018-04-14 MED ORDER — FENTANYL CITRATE (PF) 100 MCG/2ML IJ SOLN
INTRAMUSCULAR | Status: DC | PRN
  Administered 2018-04-14 (×3): 25 ug via INTRAVENOUS
  Administered 2018-04-14: 50 ug via INTRAVENOUS

## 2018-04-14 MED ORDER — BUPIVACAINE HCL 0.5 % IJ SOLN: INTRAMUSCULAR | Status: DC | PRN

## 2018-04-14 MED ORDER — ONDANSETRON HCL 4 MG/2ML IJ SOLN
INTRAMUSCULAR | Status: DC | PRN
  Administered 2018-04-14: 4 mg via INTRAVENOUS

## 2018-04-14 MED ORDER — FENTANYL CITRATE (PF) 100 MCG/2ML IJ SOLN: 25.0000 ug | INTRAMUSCULAR | Status: DC | PRN

## 2018-04-14 MED ORDER — BISOPROLOL FUMARATE 10 MG PO TABS
5.0000 mg | ORAL_TABLET | Freq: Every day | ORAL | Status: DC
Start: 2018-04-14 — End: 2018-04-15
  Administered 2018-04-15: 5 mg via ORAL
  Filled 2018-04-14: qty 1

## 2018-04-14 MED ORDER — LACTATED RINGERS IV SOLN
INTRAVENOUS | Status: DC | PRN
  Administered 2018-04-14: 07:00:00 via INTRAVENOUS

## 2018-04-14 MED ORDER — SUGAMMADEX SODIUM 200 MG/2ML IV SOLN
INTRAVENOUS | Status: DC | PRN
  Administered 2018-04-14: 200 mg via INTRAVENOUS

## 2018-04-14 MED ORDER — PROPOFOL 10 MG/ML IV BOLUS
INTRAVENOUS | Status: AC
  Filled 2018-04-14: qty 20

## 2018-04-14 MED ORDER — ONDANSETRON HCL 4 MG/2ML IJ SOLN
INTRAMUSCULAR | Status: AC
  Filled 2018-04-14: qty 2

## 2018-04-14 MED ORDER — VANCOMYCIN HCL 1000 MG IV SOLR
INTRAVENOUS | Status: AC
  Filled 2018-04-14: qty 2000

## 2018-04-14 MED ORDER — TOBRAMYCIN SULFATE 1.2 G IJ SOLR
INTRAMUSCULAR | Status: AC
  Filled 2018-04-14: qty 1.2

## 2018-04-14 MED ORDER — BACITRACIN ZINC 500 UNIT/GM EX OINT
TOPICAL_OINTMENT | CUTANEOUS | Status: AC
  Filled 2018-04-14: qty 28.35

## 2018-04-14 MED ORDER — PHENYLEPHRINE 40 MCG/ML (10ML) SYRINGE FOR IV PUSH (FOR BLOOD PRESSURE SUPPORT)
PREFILLED_SYRINGE | INTRAVENOUS | Status: DC | PRN
  Administered 2018-04-14 (×2): 80 ug via INTRAVENOUS

## 2018-04-14 MED ORDER — POLYETHYLENE GLYCOL 3350 17 G PO PACK
17.0000 g | PACK | Freq: Every day | ORAL | Status: DC
  Administered 2018-04-15 – 2018-04-17 (×3): 17 g via ORAL
  Filled 2018-04-14 (×3): qty 1

## 2018-04-14 MED ORDER — DEXAMETHASONE SODIUM PHOSPHATE 10 MG/ML IJ SOLN
INTRAMUSCULAR | Status: DC | PRN
  Administered 2018-04-14: 5 mg via INTRAVENOUS

## 2018-04-14 MED ORDER — SODIUM CHLORIDE 0.9 % IV SOLN
INTRAVENOUS | Status: DC | PRN
  Administered 2018-04-14: 20 ug/min via INTRAVENOUS

## 2018-04-14 MED ORDER — BUPIVACAINE HCL (PF) 0.5 % IJ SOLN
INTRAMUSCULAR | Status: AC
  Filled 2018-04-14: qty 30

## 2018-04-14 SURGICAL SUPPLY — 102 items
BANDAGE ACE 4X5 VEL STRL LF (GAUZE/BANDAGES/DRESSINGS) ×5 IMPLANT
BANDAGE ACE 6X5 VEL STRL LF (GAUZE/BANDAGES/DRESSINGS) ×10 IMPLANT
BANDAGE ELASTIC 6 VELCRO ST LF (GAUZE/BANDAGES/DRESSINGS) ×5 IMPLANT
BANDAGE ESMARK 6X9 LF (GAUZE/BANDAGES/DRESSINGS) ×3 IMPLANT
BIT DRILL 2.5X2.75 QC CALB (BIT) ×5 IMPLANT
BIT DRILL 4.3 (BIT) ×4
BIT DRILL 4.3MM (BIT) ×1
BIT DRILL 4.3X300MM (BIT) ×3 IMPLANT
BIT DRILL CALIBRATED 2.7 (BIT) ×4 IMPLANT
BIT DRILL CALIBRATED 2.7MM (BIT) ×1
BIT DRILL LONG 3.3 (BIT) ×4 IMPLANT
BIT DRILL LONG 3.3MM (BIT) ×1
BIT DRILL QC 3.3X195 (BIT) ×5 IMPLANT
BLADE SURG 10 STRL SS (BLADE) ×10 IMPLANT
BLADE SURG 15 STRL LF DISP TIS (BLADE) ×3 IMPLANT
BLADE SURG 15 STRL SS (BLADE) ×2
BNDG COHESIVE 4X5 TAN STRL (GAUZE/BANDAGES/DRESSINGS) ×5 IMPLANT
BNDG COHESIVE 6X5 TAN STRL LF (GAUZE/BANDAGES/DRESSINGS) IMPLANT
BNDG ESMARK 6X9 LF (GAUZE/BANDAGES/DRESSINGS) ×5
BNDG PLASTER X FAST 5X5 WHT LF (CAST SUPPLIES) ×10 IMPLANT
BRUSH SCRUB SURG 4.25 DISP (MISCELLANEOUS) ×10 IMPLANT
CAP LOCK NCB (Cap) ×35 IMPLANT
CHLORAPREP W/TINT 26ML (MISCELLANEOUS) ×5 IMPLANT
COVER SURGICAL LIGHT HANDLE (MISCELLANEOUS) ×5 IMPLANT
DRAPE C-ARM 35X43 STRL (DRAPES) ×5 IMPLANT
DRAPE C-ARM 42X72 X-RAY (DRAPES) ×5 IMPLANT
DRAPE C-ARMOR (DRAPES) ×5 IMPLANT
DRAPE HALF SHEET 40X57 (DRAPES) ×10 IMPLANT
DRAPE IMP U-DRAPE 54X76 (DRAPES) ×10 IMPLANT
DRAPE INCISE IOBAN 66X45 STRL (DRAPES) ×5 IMPLANT
DRAPE ORTHO SPLIT 77X108 STRL (DRAPES) ×4
DRAPE SURG 17X23 STRL (DRAPES) ×5 IMPLANT
DRAPE SURG ORHT 6 SPLT 77X108 (DRAPES) ×6 IMPLANT
DRAPE U-SHAPE 47X51 STRL (DRAPES) ×5 IMPLANT
DRILL BIT 2.5X100 214235007 (MISCELLANEOUS) ×5 IMPLANT
DRSG ADAPTIC 3X8 NADH LF (GAUZE/BANDAGES/DRESSINGS) ×5 IMPLANT
DRSG MEPILEX BORDER 4X4 (GAUZE/BANDAGES/DRESSINGS) ×15 IMPLANT
DRSG MEPILEX BORDER 4X8 (GAUZE/BANDAGES/DRESSINGS) ×5 IMPLANT
ELECT REM PT RETURN 9FT ADLT (ELECTROSURGICAL) ×5
ELECTRODE REM PT RTRN 9FT ADLT (ELECTROSURGICAL) ×3 IMPLANT
GAUZE SPONGE 4X4 12PLY STRL (GAUZE/BANDAGES/DRESSINGS) ×10 IMPLANT
GLOVE BIO SURGEON STRL SZ7.5 (GLOVE) ×20 IMPLANT
GLOVE BIOGEL PI IND STRL 7.5 (GLOVE) ×3 IMPLANT
GLOVE BIOGEL PI INDICATOR 7.5 (GLOVE) ×2
GLOVE SURG ORTHO 7.0 STRL STRW (GLOVE) ×5 IMPLANT
GOWN STRL REUS W/ TWL LRG LVL3 (GOWN DISPOSABLE) ×9 IMPLANT
GOWN STRL REUS W/TWL LRG LVL3 (GOWN DISPOSABLE) ×6
GOWN STRL REUS W/TWL XL LVL3 (GOWN DISPOSABLE) ×5 IMPLANT
K-WIRE 2.0 (WIRE) ×2
K-WIRE ACE 1.6X6 (WIRE) ×5
K-WIRE FXSTD 280X2XNS SS (WIRE) ×3
KIT BASIN OR (CUSTOM PROCEDURE TRAY) ×5 IMPLANT
KIT TURNOVER KIT B (KITS) ×5 IMPLANT
KWIRE ACE 1.6X6 (WIRE) ×3 IMPLANT
KWIRE FXSTD 280X2XNS SS (WIRE) ×3 IMPLANT
MANIFOLD NEPTUNE II (INSTRUMENTS) ×5 IMPLANT
NEEDLE 25GAX1.5 (MISCELLANEOUS) ×5 IMPLANT
NEEDLE HYPO 21X1.5 SAFETY (NEEDLE) IMPLANT
NS IRRIG 1000ML POUR BTL (IV SOLUTION) ×5 IMPLANT
PACK GENERAL/GYN (CUSTOM PROCEDURE TRAY) ×5 IMPLANT
PACK TOTAL JOINT (CUSTOM PROCEDURE TRAY) ×5 IMPLANT
PAD ARMBOARD 7.5X6 YLW CONV (MISCELLANEOUS) ×10 IMPLANT
PAD CAST 4YDX4 CTTN HI CHSV (CAST SUPPLIES) ×3 IMPLANT
PADDING CAST ABS 6INX4YD NS (CAST SUPPLIES) ×4
PADDING CAST ABS COTTON 6X4 NS (CAST SUPPLIES) ×6 IMPLANT
PADDING CAST COTTON 4X4 STRL (CAST SUPPLIES) ×2
PADDING CAST COTTON 6X4 STRL (CAST SUPPLIES) IMPLANT
PLATE DIST FEM 12H (Plate) ×5 IMPLANT
PLATE LOCK 8H 103 BILAT FIB (Plate) ×5 IMPLANT
SCREW 5.0 70MM (Screw) ×5 IMPLANT
SCREW 5.0 80MM (Screw) ×15 IMPLANT
SCREW CORTICAL 3.5MM  55MM (Screw) ×2 IMPLANT
SCREW CORTICAL 3.5MM 55MM (Screw) ×3 IMPLANT
SCREW CORTICAL 3.5MM 70MM (Screw) ×5 IMPLANT
SCREW CORTICAL 3.5X46MM (Screw) ×10 IMPLANT
SCREW CORTICAL NCB 5.0X65 (Screw) ×5 IMPLANT
SCREW LOCK CORT STAR 3.5X12 (Screw) ×5 IMPLANT
SCREW LOCK CORT STAR 3.5X14 (Screw) IMPLANT
SCREW LOCK CORT STAR 3.5X16 (Screw) ×10 IMPLANT
SCREW LOW PROFILE 12MMX3.5MM (Screw) ×15 IMPLANT
SCREW NCB 3.5X75X5X6.2XST (Screw) ×6 IMPLANT
SCREW NCB 4.0MX34M (Screw) ×5 IMPLANT
SCREW NCB 5.0X34MM (Screw) ×10 IMPLANT
SCREW NCB 5.0X75MM (Screw) ×4 IMPLANT
SCREW NON LOCKING LP 3.5 16MM (Screw) ×5 IMPLANT
SPONGE LAP 18X18 X RAY DECT (DISPOSABLE) ×5 IMPLANT
STAPLER VISISTAT 35W (STAPLE) ×5 IMPLANT
STOCKINETTE IMPERVIOUS LG (DRAPES) ×5 IMPLANT
SUCTION FRAZIER HANDLE 10FR (MISCELLANEOUS) ×4
SUCTION TUBE FRAZIER 10FR DISP (MISCELLANEOUS) ×6 IMPLANT
SUT ETHILON 3 0 PS 1 (SUTURE) ×20 IMPLANT
SUT MNCRL AB 3-0 PS2 18 (SUTURE) ×5 IMPLANT
SUT PROLENE 0 CT (SUTURE) IMPLANT
SUT VIC AB 0 CT1 27 (SUTURE) ×2
SUT VIC AB 0 CT1 27XBRD ANBCTR (SUTURE) ×3 IMPLANT
SUT VIC AB 2-0 CT1 27 (SUTURE) ×4
SUT VIC AB 2-0 CT1 TAPERPNT 27 (SUTURE) ×6 IMPLANT
SYR CONTROL 10ML LL (SYRINGE) ×5 IMPLANT
TOWEL OR 17X24 6PK STRL BLUE (TOWEL DISPOSABLE) ×5 IMPLANT
TOWEL OR 17X26 10 PK STRL BLUE (TOWEL DISPOSABLE) ×10 IMPLANT
UNDERPAD 30X30 (UNDERPADS AND DIAPERS) ×5 IMPLANT
WATER STERILE IRR 1000ML POUR (IV SOLUTION) ×5 IMPLANT

## 2018-04-14 NOTE — Anesthesia Procedure Notes (Signed)
Procedure Name: Intubation Date/Time: 04/14/2018 7:39 AM Performed by: Candis Shine, CRNA Pre-anesthesia Checklist: Patient identified, Emergency Drugs available, Suction available and Patient being monitored Patient Re-evaluated:Patient Re-evaluated prior to induction Oxygen Delivery Method: Circle System Utilized Preoxygenation: Pre-oxygenation with 100% oxygen Induction Type: IV induction Ventilation: Mask ventilation without difficulty and Oral airway inserted - appropriate to patient size Laryngoscope Size: Mac and 3 Grade View: Grade I Tube type: Oral Tube size: 7.0 mm Number of attempts: 1 Airway Equipment and Method: Stylet and Oral airway Placement Confirmation: ETT inserted through vocal cords under direct vision,  positive ETCO2 and breath sounds checked- equal and bilateral Secured at: 22 cm Tube secured with: Tape Dental Injury: Teeth and Oropharynx as per pre-operative assessment

## 2018-04-14 NOTE — Anesthesia Postprocedure Evaluation (Signed)
Anesthesia Post Note  Patient: Venisa Estupinan  Procedure(s) Performed: OPEN REDUCTION INTERNAL FIXATION (ORIF) ANKLE FRACTURE (Left Ankle) OPEN REDUCTION INTERNAL FIXATION (ORIF) DISTAL FEMUR FRACTURE (Left Leg Upper)     Patient location during evaluation: PACU Anesthesia Type: General Level of consciousness: awake and alert Pain management: pain level controlled Vital Signs Assessment: post-procedure vital signs reviewed and stable Respiratory status: spontaneous breathing, nonlabored ventilation, respiratory function stable and patient connected to nasal cannula oxygen Cardiovascular status: blood pressure returned to baseline and stable Postop Assessment: no apparent nausea or vomiting Anesthetic complications: no    Last Vitals:  Vitals:   04/14/18 1045 04/14/18 1058  BP: 129/60 (!) 144/71  Pulse: 62 (!) 57  Resp: 15 19  Temp:  36.6 C  SpO2: 97% 99%    Last Pain:  Vitals:   04/14/18 1015  TempSrc:   PainSc: 0-No pain                 Kennieth Rad

## 2018-04-14 NOTE — Progress Notes (Signed)
PHARMACY NOTE:  ANTIMICROBIAL RENAL DOSAGE ADJUSTMENT  Current antimicrobial regimen includes a mismatch between antimicrobial dosage and estimated renal function.  As per policy approved by the Pharmacy & Therapeutics and Medical Executive Committees, the antimicrobial dosage will be adjusted accordingly.  Current antimicrobial dosage:  Cefazolin 2gm Iv q8h x 3  Indication: Surgical prophylaxis  Renal Function:  Estimated Creatinine Clearance: 25.2 mL/min (A) (by C-G formula based on SCr of 1.41 mg/dL (H)). []      On intermittent HD, scheduled: []      On CRRT    Antimicrobial dosage has been changed to: Cefazolin 2gm IV q12h x 2  Additional comments:    Unique Sillas A. Jeanella Craze, PharmD, BCPS Clinical Pharmacist [place] Pager: [phone] Please utilize Amion for appropriate phone number to reach the unit pharmacist [facility])   04/14/2018 11:57 AM

## 2018-04-14 NOTE — Plan of Care (Signed)

## 2018-04-14 NOTE — Progress Notes (Signed)
PROGRESS NOTE    Makayla Thomas  ZOX:[id number] DOB: 1926-02-29 DOA: 04/13/2018 PCP: Merri Brunette, MD    Brief Narrative:: Makayla Thomas is a 84 y.o. female with medical history significant of TIA; afib on Xarelto; moderate AS; PAD; hypothyroidism; HTN;  MS; HLD; and DM presenting with tibial fracture.  She is unable to provide history due to her dementia.     son Makayla Thomas)  reports that she has been receiving all of her medications packaged today through the pharmacy.  The family realized recently that the package did not include some of her pills and she had not been taking bisoprolol and another unspecified medication for at least a week or more.  The pharmacy provided the packaged medications and 2 "loose pills" starting last week.   Her son saw her last week and she was doing fairly well.  He thinks this fall might be related to resumption of bisoprolol.  In the last week, she has really gone downhill - he thinks it was related to this medication change.  As a result, the family called Dr. Elissa Hefty office and he recommended stopping Cardizem instead of Bisoprolol - this has not happened yet.  At her 89th birthday party, they also discussed code status - but she would not commit.  Her sons decided together that she should be DNR.  After her prior hip replacement, post-anesthesia or with pain medication, she had serious hallucinations.  ED Course:   Mechanical fall today.  On Xarelto for afib, mild dementia.  Fall on left side - periprosthetic femur fracture, left bimalleolar fracture.  [place].  Orthopedics consulted, possible ORIF for both this afternoon.   Assessment & Plan:   Principal Problem:   Peri-prosthetic fracture of femur at tip of prosthesis Active Problems:   Permanent atrial fibrillation (HCC); CHA2DS2-VASc Score 6   Chronic anticoagulation   Dyslipidemia, goal LDL below 100 - due aortic stenosis   Essential hypertension -well controlled  Hypothyroidism   Trimalleolar fracture of ankle, closed, left, initial encounter   CKD (chronic kidney disease) stage 3, GFR 30-59 ml/min (HCC)   Hyperglycemia   Dementia  Left Hip and trimalleolar ankle fracture;  PT per ortho.  Post op today 8-29. Pain management.  Schedule miralax for bowel regimen,  Check CBC.   Permanent A fib;  Discussed with Dr Herbie [place], patient ; son concern regarding Cardizem and making patient sleepy and tired. Plan to hold Cardizem for now. Continue with low dose bisoprolol 5 mg daily and if Hr increases increase bisprolol to 10.  Will monitor patient on telemetry.  resume Xarelto when ok by ortho.   Confusion;  Worsening confusion, weakness over last 3 week. Some changes in cognition for last 3 months.  Will check B 12.  TSH normal.  Check Vitamin D level.   Mild to moderate aortic stenosis Monitor./  Out patient follow up.   HTN; monitor post op.     DVT prophylaxis: heparin  Code Status: DNR Family Communication: ( son at bedside.  Disposition Plan: she will need rehab.   Consultants:   Ortho  Cardiology    Procedures:      Antimicrobials:   prophylaxis    Subjective: She is lethargic, post op. Open eyes , say few words.   Objective: Vitals:   04/14/18 1030 04/14/18 1045 04/14/18 1058 04/14/18 1229  BP: 96/75 129/60 (!) 144/71 (!) 123/57  Pulse: (!) 48 62 (!) 57 64  Resp: 16 15 19  16  Temp:   97.9 F (36.6 C) 98.3 F (36.8 C)  TempSrc:    Oral  SpO2: 100% 97% 99% 94%  Weight:      Height:        Intake/Output Summary (Last 24 hours) at 04/14/2018 1331 Last data filed at 04/14/2018 1100 Gross per 24 hour  Intake 810 ml  Output 1075 ml  Net -265 ml   Filed Weights   04/13/18 0545  Weight: 75 kg    Examination:  General exam: Appears calm and comfortable  Respiratory system: Clear to auscultation. Respiratory effort normal. Cardiovascular system: S1 & S2 heard, RRR. No JVD, murmurs, rubs, gallops or  clicks. No pedal edema. Gastrointestinal system: Abdomen is nondistended, soft and nontender. No organomegaly or masses felt. Normal bowel sounds heard. Central nervous system: sleepy Extremities: left LE with dressing.  Skin: No rashes, lesions or ulcers   Data Reviewed: I have personally reviewed following labs and imaging studies  CBC: Recent Labs  Lab 04/13/18 0613  WBC 8.6  NEUTROABS 6.5  HGB 13.9  HCT 43.0  MCV 96.2  PLT 247   Basic Metabolic Panel: Recent Labs  Lab 04/13/18 0613  NA 140  K 4.1  CL 105  CO2 27  GLUCOSE 147*  BUN 20  CREATININE 1.41*  CALCIUM 9.3   GFR: Estimated Creatinine Clearance: 25.2 mL/min (A) (by C-G formula based on SCr of 1.41 mg/dL (H)). Liver Function Tests: No results for input(s): AST, ALT, ALKPHOS, BILITOT, PROT, ALBUMIN in the last 168 hours. No results for input(s): LIPASE, AMYLASE in the last 168 hours. No results for input(s): AMMONIA in the last 168 hours. Coagulation Profile: Recent Labs  Lab 04/13/18 0632  INR 2.14   Cardiac Enzymes: No results for input(s): CKTOTAL, CKMB, CKMBINDEX, TROPONINI in the last 168 hours. BNP (last 3 results) No results for input(s): PROBNP in the last 8760 hours. HbA1C: No results for input(s): HGBA1C in the last 72 hours. CBG: Recent Labs  Lab 04/14/18 1015  GLUCAP 149*   Lipid Profile: Recent Labs    04/13/18 1107  CHOL 212*  HDL 47  LDLCALC 143*  TRIG 110  CHOLHDL 4.5   Thyroid Function Tests: Recent Labs    04/13/18 1107  TSH 1.005  FREET4 1.74   Anemia Panel: No results for input(s): VITAMINB12, FOLATE, FERRITIN, TIBC, IRON, RETICCTPCT in the last 72 hours. Sepsis Labs: No results for input(s): PROCALCITON, LATICACIDVEN in the last 168 hours.  Recent Results (from the past 240 hour(s))  MRSA PCR Screening     Status: None   Collection Time: 04/13/18  6:34 PM  Result Value Ref Range Status   MRSA by PCR NEGATIVE NEGATIVE Final    Comment:        The  GeneXpert MRSA Assay (FDA approved for NASAL specimens only), is one component of a comprehensive MRSA colonization surveillance program. It is not intended to diagnose MRSA infection nor to guide or monitor treatment for MRSA infections. Performed at [facility], [address] [place], [place] [address]          Radiology Studies: Dg Chest 1 View  Result Date: 04/13/2018 CLINICAL DATA:  84 year old female with a history of fall and a left leg deformity EXAM: CHEST  1 VIEW COMPARISON:  03/11/2018, 06/27/2017 FINDINGS: Cardiomediastinal silhouette unchanged in size and contour with cardiomegaly. Right rotation somewhat accentuates the heart borders. Calcifications of the aortic arch. Low lung volumes. Coarsened interstitial markings, similar to prior. No confluent  airspace disease, pneumothorax, or pleural effusion. Degenerative changes of the spine IMPRESSION: Chronic lung changes and likely atelectasis with no evidence of acute cardiopulmonary disease. Cardiomegaly Electronically Signed   By: Gilmer Mor D.O.   On: 04/13/2018 07:00   Dg Tibia/fibula Left  Result Date: 04/13/2018 CLINICAL DATA:  84 y/o F; fall this morning with left leg deformity. EXAM: LEFT TIBIA AND FIBULA - 2 VIEW; LEFT FEMUR 2 VIEWS; LEFT FOOT - COMPLETE 3+ VIEW COMPARISON:  01/03/2014 CT of the left knee. FINDINGS: Left femur: Acute comminuted transverse fracture of the lower femoral diaphysis just above the knee prosthesis with 1 shaft's width posterior displacement of the distal fracture component. The hip joint appears well maintained. Left tibia and fibula: Left distal femur fracture as above. Total knee prosthesis without periprosthetic lucency. Chronic fracture deformity of the patella. Vascular calcifications noted. Acute minimally displaced fracture of the medial malleolus. Acute oblique minimally displaced fracture of the lower fibula above level of tibial plafond. No ankle joint dislocation  identified on these nonstress views. Possible minimally displaced fracture of the posterior malleolus. Left foot: There is no evidence of fracture or other focal bone lesions the foot. Soft tissues are unremarkable. IMPRESSION: 1. Acute comminuted transverse fracture of the left lower femur diaphysis just above the knee prosthesis. One shaft's width posterior displacement of distal fracture component. 2. Acute minimally displaced fracture of left medial malleolus. 3. Acute minimally displaced fracture of the left lower fibula diaphysis above level of tibial plafond. 4. Possible minimally displaced acute fracture of posterior malleolus. 5. Chronic fracture deformity of the left patella. Electronically Signed   By: Mitzi Hansen M.D.   On: 04/13/2018 06:46   Dg Ankle 2 Views Left  Result Date: 04/13/2018 CLINICAL DATA:  Recent fall with left ankle pain and deformity, initial encounter EXAM: LEFT ANKLE - 2 VIEW COMPARISON:  Films from earlier in the same day. FINDINGS: Oblique fracture through the distal fibular metaphysis is noted. Additionally a transverse fracture through the medial malleolus is seen. Slight angulation laterally at the fracture site is noted. Soft tissue swelling is seen. No other fracture is identified. IMPRESSION: Distal tibial and fibular fractures as described. No definitive posterior malleolar fracture is seen. Electronically Signed   By: Alcide Clever M.D.   On: 04/13/2018 08:16   Ct Head Wo Contrast  Result Date: 04/13/2018 CLINICAL DATA:  Recent fall with head injury EXAM: CT HEAD WITHOUT CONTRAST CT CERVICAL SPINE WITHOUT CONTRAST TECHNIQUE: Multidetector CT imaging of the head and cervical spine was performed following the standard protocol without intravenous contrast. Multiplanar CT image reconstructions of the cervical spine were also generated. COMPARISON:  03/11/2018 FINDINGS: CT HEAD FINDINGS Brain: Chronic atrophic changes and chronic white matter ischemic changes  again noted and stable. No findings to suggest acute hemorrhage, acute infarction space-occupying lesion seen. Vascular: No hyperdense vessel or unexpected calcification. Skull: Normal. Negative for fracture or focal lesion. Sinuses/Orbits: No acute finding. Other: None. CT CERVICAL SPINE FINDINGS Alignment: Within normal limits. Skull base and vertebrae: 7 cervical segments are well visualized. Vertebral body height is well maintained. Multilevel disc space narrowing is noted from C3-C7. Mild osteophytic changes and facet hypertrophic changes are noted. No findings to suggest acute fracture or facet abnormality seen. The odontoid is within normal limits. Multilevel neural foraminal narrowing is noted bilaterally. Soft tissues and spinal canal: Diffuse vascular calcifications are noted. No acute soft tissue abnormality is noted. Upper chest: Within normal limits Other: None IMPRESSION: CT of the head:  Chronic atrophic and ischemic changes without acute abnormality. CT of the cervical spine: Multilevel degenerative change without acute abnormality. Electronically Signed   By: Alcide Clever M.D.   On: 04/13/2018 07:58   Ct Cervical Spine Wo Contrast  Result Date: 04/13/2018 CLINICAL DATA:  Recent fall with head injury EXAM: CT HEAD WITHOUT CONTRAST CT CERVICAL SPINE WITHOUT CONTRAST TECHNIQUE: Multidetector CT imaging of the head and cervical spine was performed following the standard protocol without intravenous contrast. Multiplanar CT image reconstructions of the cervical spine were also generated. COMPARISON:  03/11/2018 FINDINGS: CT HEAD FINDINGS Brain: Chronic atrophic changes and chronic white matter ischemic changes again noted and stable. No findings to suggest acute hemorrhage, acute infarction space-occupying lesion seen. Vascular: No hyperdense vessel or unexpected calcification. Skull: Normal. Negative for fracture or focal lesion. Sinuses/Orbits: No acute finding. Other: None. CT CERVICAL SPINE  FINDINGS Alignment: Within normal limits. Skull base and vertebrae: 7 cervical segments are well visualized. Vertebral body height is well maintained. Multilevel disc space narrowing is noted from C3-C7. Mild osteophytic changes and facet hypertrophic changes are noted. No findings to suggest acute fracture or facet abnormality seen. The odontoid is within normal limits. Multilevel neural foraminal narrowing is noted bilaterally. Soft tissues and spinal canal: Diffuse vascular calcifications are noted. No acute soft tissue abnormality is noted. Upper chest: Within normal limits Other: None IMPRESSION: CT of the head: Chronic atrophic and ischemic changes without acute abnormality. CT of the cervical spine: Multilevel degenerative change without acute abnormality. Electronically Signed   By: Alcide Clever M.D.   On: 04/13/2018 07:58   Dg Foot Complete Left  Result Date: 04/13/2018 CLINICAL DATA:  84 y/o F; fall this morning with left leg deformity. EXAM: LEFT TIBIA AND FIBULA - 2 VIEW; LEFT FEMUR 2 VIEWS; LEFT FOOT - COMPLETE 3+ VIEW COMPARISON:  01/03/2014 CT of the left knee. FINDINGS: Left femur: Acute comminuted transverse fracture of the lower femoral diaphysis just above the knee prosthesis with 1 shaft's width posterior displacement of the distal fracture component. The hip joint appears well maintained. Left tibia and fibula: Left distal femur fracture as above. Total knee prosthesis without periprosthetic lucency. Chronic fracture deformity of the patella. Vascular calcifications noted. Acute minimally displaced fracture of the medial malleolus. Acute oblique minimally displaced fracture of the lower fibula above level of tibial plafond. No ankle joint dislocation identified on these nonstress views. Possible minimally displaced fracture of the posterior malleolus. Left foot: There is no evidence of fracture or other focal bone lesions the foot. Soft tissues are unremarkable. IMPRESSION: 1. Acute  comminuted transverse fracture of the left lower femur diaphysis just above the knee prosthesis. One shaft's width posterior displacement of distal fracture component. 2. Acute minimally displaced fracture of left medial malleolus. 3. Acute minimally displaced fracture of the left lower fibula diaphysis above level of tibial plafond. 4. Possible minimally displaced acute fracture of posterior malleolus. 5. Chronic fracture deformity of the left patella. Electronically Signed   By: Mitzi Hansen M.D.   On: 04/13/2018 06:46   Dg Femur Min 2 Views Left  Result Date: 04/13/2018 CLINICAL DATA:  84 y/o F; fall this morning with left leg deformity. EXAM: LEFT TIBIA AND FIBULA - 2 VIEW; LEFT FEMUR 2 VIEWS; LEFT FOOT - COMPLETE 3+ VIEW COMPARISON:  01/03/2014 CT of the left knee. FINDINGS: Left femur: Acute comminuted transverse fracture of the lower femoral diaphysis just above the knee prosthesis with 1 shaft's width posterior displacement of the distal fracture component.  The hip joint appears well maintained. Left tibia and fibula: Left distal femur fracture as above. Total knee prosthesis without periprosthetic lucency. Chronic fracture deformity of the patella. Vascular calcifications noted. Acute minimally displaced fracture of the medial malleolus. Acute oblique minimally displaced fracture of the lower fibula above level of tibial plafond. No ankle joint dislocation identified on these nonstress views. Possible minimally displaced fracture of the posterior malleolus. Left foot: There is no evidence of fracture or other focal bone lesions the foot. Soft tissues are unremarkable. IMPRESSION: 1. Acute comminuted transverse fracture of the left lower femur diaphysis just above the knee prosthesis. One shaft's width posterior displacement of distal fracture component. 2. Acute minimally displaced fracture of left medial malleolus. 3. Acute minimally displaced fracture of the left lower fibula diaphysis  above level of tibial plafond. 4. Possible minimally displaced acute fracture of posterior malleolus. 5. Chronic fracture deformity of the left patella. Electronically Signed   By: Mitzi Hansen M.D.   On: 04/13/2018 06:46   Dg Hips Bilat W Or Wo Pelvis 3-4 Views  Result Date: 04/13/2018 CLINICAL DATA:  Recent fall with bilateral hip pain left greater than right EXAM: DG HIP (WITH OR WITHOUT PELVIS) 3-4V BILAT COMPARISON:  03/11/2018 FINDINGS: Prior right hip replacement is again identified and stable. The pelvic ring is intact. No acute fracture or dislocation is noted. No soft tissue changes are seen. IMPRESSION: No acute abnormality noted. Electronically Signed   By: Alcide Clever M.D.   On: 04/13/2018 08:14        Scheduled Meds: . bisoprolol  10-15 mg Oral Daily  . darifenacin  15 mg Oral Daily  . diltiazem  120 mg Oral QHS  . docusate sodium  100 mg Oral BID  . heparin  5,000 Units Subcutaneous Q8H  . levothyroxine  125 mcg Oral QAC breakfast  . trimethoprim  100 mg Oral Daily   Continuous Infusions: .  ceFAZolin (ANCEF) IV    . lactated ringers 75 mL/hr at 04/14/18 0528  . methocarbamol (ROBAXIN) IV       LOS: 1 day    Time spent: 35 minutes.     Alba Cory, MD [facility] Pager [phone]  If 7PM-7AM, please contact night-coverage [email or web address] Password [email or web address] 04/14/2018, 1:31 PM

## 2018-04-14 NOTE — Plan of Care (Signed)
  Problem: Education: Goal: Knowledge of General Education information will improve Description Including pain rating scale, medication(s)/side effects and non-pharmacologic comfort measures 04/14/2018 1519 by Darreld Mclean, RN Outcome: Progressing 04/14/2018 1242 by Darreld Mclean, RN Outcome: Progressing   Problem: Health Behavior/Discharge Planning: Goal: Ability to manage health-related needs will improve 04/14/2018 1519 by Darreld Mclean, RN Outcome: Progressing 04/14/2018 1242 by Darreld Mclean, RN Outcome: Progressing   Problem: Clinical Measurements: Goal: Ability to maintain clinical measurements within normal limits will improve 04/14/2018 1519 by Darreld Mclean, RN Outcome: Progressing 04/14/2018 1242 by Darreld Mclean, RN Outcome: Progressing Goal: Will remain free from infection 04/14/2018 1519 by Darreld Mclean, RN Outcome: Progressing 04/14/2018 1242 by Darreld Mclean, RN Outcome: Progressing Goal: Diagnostic test results will improve 04/14/2018 1519 by Darreld Mclean, RN Outcome: Progressing 04/14/2018 1242 by Darreld Mclean, RN Outcome: Progressing Goal: Respiratory complications will improve 04/14/2018 1519 by Darreld Mclean, RN Outcome: Progressing 04/14/2018 1242 by Darreld Mclean, RN Outcome: Progressing Goal: Cardiovascular complication will be avoided 04/14/2018 1519 by Darreld Mclean, RN Outcome: Progressing 04/14/2018 1242 by Darreld Mclean, RN Outcome: Progressing   Problem: Activity: Goal: Risk for activity intolerance will decrease 04/14/2018 1519 by Darreld Mclean, RN Outcome: Progressing 04/14/2018 1242 by Darreld Mclean, RN Outcome: Progressing   Problem: Nutrition: Goal: Adequate nutrition will be maintained 04/14/2018 1519 by Darreld Mclean, RN Outcome: Progressing 04/14/2018 1242 by Darreld Mclean, RN Outcome: Progressing   Problem: Coping: Goal: Level of anxiety will decrease 04/14/2018 1519 by Darreld Mclean, RN Outcome: Progressing 04/14/2018 1242 by Darreld Mclean, RN Outcome: Progressing   Problem: Elimination: Goal: Will not  experience complications related to bowel motility 04/14/2018 1519 by Darreld Mclean, RN Outcome: Progressing 04/14/2018 1242 by Darreld Mclean, RN Outcome: Progressing Goal: Will not experience complications related to urinary retention 04/14/2018 1519 by Darreld Mclean, RN Outcome: Progressing 04/14/2018 1242 by Darreld Mclean, RN Outcome: Progressing   Problem: Pain Managment: Goal: General experience of comfort will improve 04/14/2018 1519 by Darreld Mclean, RN Outcome: Progressing 04/14/2018 1242 by Darreld Mclean, RN Outcome: Progressing   Problem: Safety: Goal: Ability to remain free from injury will improve 04/14/2018 1519 by Darreld Mclean, RN Outcome: Progressing 04/14/2018 1242 by Darreld Mclean, RN Outcome: Progressing   Problem: Skin Integrity: Goal: Risk for impaired skin integrity will decrease 04/14/2018 1519 by Darreld Mclean, RN Outcome: Progressing 04/14/2018 1242 by Darreld Mclean, RN Outcome: Progressing

## 2018-04-14 NOTE — Op Note (Signed)
OrthopaedicSurgeryOperativeNote (XBJ:[id number]) Date of Surgery: 04/13/2018 - 04/14/2018  Admit Date: 04/13/2018   Diagnoses: Pre-Op Diagnoses: Left periprosthetic supracondylar femur fracture Left bimalleolar ankle fracture   Post-Op Diagnosis: Same  Procedures: 1. CPT 27511-Open reduction internal fixation of left supracondylar femur fracture 2. CPT 27814-Open reduction internal fixation of left bimalleolar ankle fracture 3. CPT 27829-Open reduction internal fixation of left syndesmosis  Surgeons: Primary: Roby Lofts, MD   Location:[facility] OR ROOM 03   AnesthesiaChoice   Antibiotics:Ancef 2g preop   Tourniquettime:* Missing tourniquet times found for documented tourniquets in log: [id number] * .  EstimatedBloodLoss:125 mL   Complications: None  Specimens:None  Implants: Implant Name Type Inv. Item Serial No. Manufacturer Lot No. LRB No. Used Action  SCREW 5.0 - [id number] Screw SCREW 5.0  [employer](ORTH,TRAU,BIO,SG)  Left 1 Implanted  SCREW 5.0 - [id number] Screw SCREW 5.0  [employer](ORTH,TRAU,BIO,SG)  Left 3 Implanted  SCREW 5.0 - [id number] Screw SCREW 5.0  [employer](ORTH,TRAU,BIO,SG)  Left 1 Implanted  SCREW CORTICAL NCB 5.0X65 - [id number] Screw SCREW CORTICAL NCB 5.0X65  [employer](ORTH,TRAU,BIO,SG)  Left 1 Implanted  SCREW NCB 5.0X34MM - [id number] Screw SCREW NCB 5.0X34MM  [employer](ORTH,TRAU,BIO,SG)  Left 2 Implanted  SCREW NCB 5.0X75MM - [id number] Screw SCREW NCB 5.0X75MM  [employer](ORTH,TRAU,BIO,SG)  Left 1 Implanted  PLATE DIST FEM 66A - [id number] Plate PLATE DIST FEM 32T  [employer](ORTH,TRAU,BIO,SG)  Left 1 Implanted  SCREW NCB 4.0MX34M - [id number] Screw SCREW NCB 4.0MX34M  [employer](ORTH,TRAU,BIO,SG)  Left 1 Implanted  CAP LOCK NCB - [id number] Cap CAP LOCK NCB  [employer](ORTH,TRAU,BIO,SG)  Left 7 Implanted  PLATE FIBULAR COMP LOCKING 8H - [id number] Plate PLATE FIBULAR COMP LOCKING 8H  [employer]IMMER  [employer](ORTH,TRAU,BIO,SG)  Left 1 Implanted  SCREW NON LOCKING LP 3.5 - [id number] Screw SCREW NON LOCKING LP 3.5  [employer](ORTH,TRAU,BIO,SG)  Left 2 Implanted  SCREW LOW PROFILE 12MMX3. - [id number] Screw SCREW LOW PROFILE 12MMX3.  [employer](ORTH,TRAU,BIO,SG)  Left 3 Implanted  SCREW CORT LOCK 3.5MM - [id number] Screw SCREW CORT LOCK 3.5MM  [employer](ORTH,TRAU,BIO,SG)  Left 1 Implanted  SCREW CORT LOCK 3.5MM - [id number] Screw SCREW CORT LOCK 3.5MM  [employer](ORTH,TRAU,BIO,SG)  Left 1 Implanted  SCREW CORT LOCK 3.5MM - [id number] Screw SCREW CORT LOCK 3.5MM  [employer](ORTH,TRAU,BIO,SG)  Left 1 Implanted  SCREW CORT LOCK 3.5MM - [id number] Screw SCREW CORT LOCK 3.5MM  [employer](ORTH,TRAU,BIO,SG)  Left 1 Implanted  SCREW CORTICAL 3.5MM  - [id number] Screw SCREW CORTICAL 3.5MM   [employer](ORTH,TRAU,BIO,SG)  Left 1 Implanted  SCREW CORTICAL 3.5MM - [id number] Screw SCREW CORTICAL 3.5MM  [employer](ORTH,TRAU,BIO,SG)  Left 1 Implanted  SCREW CORTICAL 3.5X46MM - [id number] Screw SCREW CORTICAL 3.5X46MM  [employer](ORTH,TRAU,BIO,SG)  Left 1 Implanted  SCREW CORTICAL 3.5X46MM - [id number] Screw SCREW CORTICAL 3.5X46MM  [employer](ORTH,TRAU,BIO,SG)  Left 1 Implanted    IndicationsforSurgery: 84 year old female on Xarelto for A. fib who had a mechanical fall and sustained a left periprosthetic distal femur fracture and associated bimalleolar ankle fracture on the same side.  I recommended proceeding for ORIF of left distal femur and ankle.  I discussed risks and benefits with the patient and her family. Risks discussed included bleeding requiring blood transfusion, bleeding causing a hematoma, infection, malunion, nonunion, damage to surrounding nerves and blood vessels, pain, hardware prominence or irritation, hardware failure, stiffness, post-traumatic arthritis, DVT/PE, compartment syndrome, and even death.  They  agreed to proceed with surgery and consent was obtained.  Operative Findings: 1.  Open reduction internal fixation of left periprosthetic distal femur fracture using [employer] NCB distal femoral locking plate 12 hole. 2.  Open reduction internal fixation of left bimalleolar ankle fracture using a [employer] composite fibular plate and 3.5 mm titanium nonlocking screws from medial malleolus. 3.  External rotation stress view showed some widening of the medial clear space after fixation so syndesmotic screw across both fibula and tibia was provided with a 3.5 mm nonlocking screw.  Procedure: The patient was identified in the preoperative holding area. Consent was confirmed with the patient and their family and all questions were answered. The operative extremity was marked after confirmation with the patient. The patientwas then brought back to the operating room by our anesthesia colleagues. The patient was placed under general anesthesia and was carefully transferred over to a radiolucent flattop table. They were secured to the bed and all bony prominences were padded.  A bump was placed under the operative hip. The operative extremity was then prepped and draped in usual sterile fashion. A timeout was performed to verify the patient, the procedure and extremity. Preoperative antibiotics were dosed.  Fluoroscopy was used to identify the fracture. A lateral approach was made to the distal femur. It was taken down through the skin and the IT band was incised in line with the incision. The distal portion of the vastus lateralis was reflected off the IT band and the distal articular block of the lateral femoral condyle was exposed.  An attempt was made to keep the fracture from being stripped. I used fluoroscopy to identify the correct length plate to bridge the whole femoral shaft and proximal to the tip of the hip prothesis. I chose a 12-hole plate. The aiming arm was attached to the plate and slide  submuscularly under the vastus to the proximal femur. The distal portion of the plate was placed flush against the lateral cortex of the distal articular block and pinned in place with a 2.0 mm K wire. A 3.55mm drill bit was used to position the proximal portion of the plate. A perfect lateral was obtained to show appropriate positioning of the plate.  The distal segment was drilled and 5.35mm screws were placed in the distal fracture segment. A total of 2 screws were placed initially. A 5.0 mm screw was placed percutaneously in the shaft to reduce the coronal alignment. A total of three shaft screws were placed to allow for a significant working length and micromotion of the fracture. Locking caps were placed in the shaft screws except for the most proximal screw to prevent a stress riser. The targeting guide was removed. Four more screws were placed in the distal segment. Fluoroscopy was used to confirm adequate reduction of the fracture and appropriate position of the plate and screws.  The incisions were irrigated with normal saline. A gram of vancomycin powder was placed in the incision around the plate. The IT band was closed with 0-vicryl. The skin was closed with 2-vicryl, 3-0 nylon.   I then turned my attention to the left ankle fracture.  Fluoroscopy was used to confirm the location of the fracture and the instability of the fracture.  Performed a direct lateral approach to the fibula.  I took care to protect the superficial peroneal nerve.  Subperiosteal dissection was performed to visualize the fracture.  A 8 hole fibular composite locking plate was then contoured to fit the posterior lateral  distal aspect of the fibula and contour around the lateral side of the proximal fibula.  It was held reduced to the bone.  Both locking and nonlocking screws were placed to provide fixation.  Adequate reduction was obtained.  I then turned my attention to the medial side.  It was nearly anatomic and due to  her age and frailty of her skin I felt that percutaneous fixation of medial malleolus would be most appropriate.  Using a 15 blade I was able to make percutaneous incisions and then used a 2.5 mm drill bit to drill bicortically across the medial malleolus fracture is seen in the lateral cortex of the tibia.  I placed a 3.5 mm nonlocking screws and obtained excellent purchase.  The reduction appeared anatomic on radiographs.  I then performed a external rotation stress view and it appeared that the medial clear space is widened.  At this point I removed 1 of nonlocking screws from the fibular plate and proceeded to place a syndesmotic screw across all 4 cortices.  Excellent fixation was obtained.  There was no motion with repeat external rotation stress view.  The incisions were copiously irrigated.  A gram of vancomycin powder was placed into the incisions.  It was closed with 2-0 Vicryl and 3-0 nylon.  A sterile dressing consisting of bacitracin ointment, Adaptic, 4 x 4's and sterile cast padding was placed to both the femur and the ankle wounds.  I then placed a well-padded short leg splint to immobilize the ankle.  The patient was then awoken from anesthesia and taken to PACU in stable condition.  Post Op Plan/Instructions: The patient will be nonweightbearing to the left lower extremity.  She will receive postoperative Ancef.  She may be restarted on her Xarelto on postoperative day 1.  We will monitor her hemoglobin for possible postoperative transfusion.  She will mobilize with physical and occupational therapy.  I was present and performed the entire surgery.  Truitt Merle, MD [facility]

## 2018-04-14 NOTE — Transfer of Care (Signed)
Immediate Anesthesia Transfer of Care Note  Patient: Makayla Thomas  Procedure(s) Performed: OPEN REDUCTION INTERNAL FIXATION (ORIF) ANKLE FRACTURE (Left Ankle) OPEN REDUCTION INTERNAL FIXATION (ORIF) DISTAL FEMUR FRACTURE (Left Leg Upper)  Patient Location: PACU  Anesthesia Type:General  Level of Consciousness: awake and alert   Airway & Oxygen Therapy: Patient Spontanous Breathing and Patient connected to nasal cannula oxygen  Post-op Assessment: Report given to RN and Post -op Vital signs reviewed and stable  Post vital signs: Reviewed and stable  Last Vitals:  Vitals Value Taken Time  BP 124/55 04/14/2018 10:14 AM  Temp    Pulse 77 04/14/2018 10:15 AM  Resp 18 04/14/2018 10:15 AM  SpO2 99 % 04/14/2018 10:15 AM  Vitals shown include unvalidated device data.  Last Pain:  Vitals:   04/14/18 0300  TempSrc: Oral  PainSc:          Complications: No apparent anesthesia complications

## 2018-04-14 NOTE — Plan of Care (Signed)
  Problem: Safety: Goal: Ability to remain free from injury will improve Outcome: Progressing   

## 2018-04-15 ENCOUNTER — Encounter (HOSPITAL_COMMUNITY): Payer: Self-pay | Admitting: Student

## 2018-04-15 LAB — CBC
HCT: 33.1 % — ABNORMAL LOW (ref 36.0–46.0)
HEMOGLOBIN: 10.7 g/dL — AB (ref 12.0–15.0)
MCH: 30.5 pg (ref 26.0–34.0)
MCHC: 32.3 g/dL (ref 30.0–36.0)
MCV: 94.3 fL (ref 78.0–100.0)
Platelets: 225 10*3/uL (ref 150–400)
RBC: 3.51 MIL/uL — AB (ref 3.87–5.11)
RDW: 13.7 % (ref 11.5–15.5)
WBC: 12.1 10*3/uL — AB (ref 4.0–10.5)

## 2018-04-15 LAB — BASIC METABOLIC PANEL
Anion gap: 7 (ref 5–15)
BUN: 16 mg/dL (ref 8–23)
CO2: 28 mmol/L (ref 22–32)
Calcium: 8.7 mg/dL — ABNORMAL LOW (ref 8.9–10.3)
Chloride: 105 mmol/L (ref 98–111)
Creatinine, Ser: 1 mg/dL (ref 0.44–1.00)
GFR calc non Af Amer: 48 mL/min — ABNORMAL LOW (ref >60)
GFR, EST AFRICAN AMERICAN: 56 mL/min — AB (ref >60)
Glucose, Bld: 142 mg/dL — ABNORMAL HIGH (ref 70–99)
POTASSIUM: 3.9 mmol/L (ref 3.5–5.1)
Sodium: 140 mmol/L (ref 135–145)

## 2018-04-15 LAB — VITAMIN B12: VITAMIN B 12: 687 pg/mL (ref 180–914)

## 2018-04-15 MED ORDER — ACETAMINOPHEN 325 MG PO TABS
650.0000 mg | ORAL_TABLET | Freq: Four times a day (QID) | ORAL | Status: DC | PRN
  Administered 2018-04-16 – 2018-04-19 (×6): 650 mg via ORAL
  Filled 2018-04-15 (×6): qty 2

## 2018-04-15 MED ORDER — VITAMIN D 1000 UNITS PO TABS
1000.0000 [IU] | ORAL_TABLET | Freq: Every morning | ORAL | Status: DC
  Administered 2018-04-15 – 2018-04-20 (×6): 1000 [IU] via ORAL
  Filled 2018-04-15 (×6): qty 1

## 2018-04-15 MED ORDER — ADULT MULTIVITAMIN W/MINERALS CH
1.0000 | ORAL_TABLET | Freq: Every day | ORAL | Status: DC
  Administered 2018-04-15 – 2018-04-20 (×6): 1 via ORAL
  Filled 2018-04-15 (×6): qty 1

## 2018-04-15 MED ORDER — BISOPROLOL FUMARATE 10 MG PO TABS
5.0000 mg | ORAL_TABLET | Freq: Once | ORAL | Status: AC
  Administered 2018-04-15: 5 mg via ORAL
  Filled 2018-04-15: qty 1

## 2018-04-15 MED ORDER — BISOPROLOL FUMARATE 10 MG PO TABS
10.0000 mg | ORAL_TABLET | Freq: Every day | ORAL | Status: DC
  Administered 2018-04-16 – 2018-04-20 (×5): 10 mg via ORAL
  Filled 2018-04-15 (×5): qty 1

## 2018-04-15 MED ORDER — RIVAROXABAN 15 MG PO TABS
15.0000 mg | ORAL_TABLET | Freq: Every day | ORAL | Status: DC
  Administered 2018-04-15 – 2018-04-19 (×5): 15 mg via ORAL
  Filled 2018-04-15 (×6): qty 1

## 2018-04-15 MED ORDER — ENSURE ENLIVE PO LIQD
237.0000 mL | Freq: Two times a day (BID) | ORAL | Status: DC
  Administered 2018-04-15 – 2018-04-16 (×4): 237 mL via ORAL

## 2018-04-15 MED ORDER — OXYCODONE HCL 5 MG PO TABS: 5.0000 mg | ORAL_TABLET | Freq: Four times a day (QID) | ORAL | Status: DC | PRN

## 2018-04-15 NOTE — Progress Notes (Signed)
Initial Nutrition Assessment  DOCUMENTATION CODES:   Obesity unspecified  INTERVENTION:    Ensure Enlive po BID, each supplement provides 350 kcal and 20 grams of protein  MVI daily  NUTRITION DIAGNOSIS:   Increased nutrient needs related to acute illness(hip fracture) as evidenced by estimated needs.  GOAL:   Patient will meet greater than or equal to 90% of their needs  MONITOR:   PO intake, Supplement acceptance  REASON FOR ASSESSMENT:   Consult Hip fracture protocol  ASSESSMENT:   84 yo female with PMH of A fib, hypothyroidism, HLD, PAD, chronic LE edema, TIA, DM, HTN, stroke, and osteoarthritis who was admitted on 8/28 with L femur and L ankle fractures s/p mechanical fall. S/P ORIF ankle and femur 8/29.  Spoke with patient and her son in patient's room. Patient loves to eat all foods. She has a good appetite. She has been trying to lose weight and has been successful. Weight down ~10-12 lbs over the past year. She currently does not have any paste to adhere dentures to her gums, so she is unable to chew well. She was not able to eat much breakfast, but she has no trouble with liquids. Her daughter-in-law is bringing in denture adhesive today.   Labs reviewed.  Medications reviewed and include Vitamin D, Colace, Miralax.  NUTRITION - FOCUSED PHYSICAL EXAM:    Most Recent Value  Orbital Region  No depletion  Upper Arm Region  No depletion  Thoracic and Lumbar Region  Unable to assess  Buccal Region  No depletion  Temple Region  No depletion  Clavicle Bone Region  No depletion  Clavicle and Acromion Bone Region  No depletion  Scapular Bone Region  Unable to assess  Dorsal Hand  Mild depletion  Patellar Region  No depletion  Anterior Thigh Region  No depletion  Posterior Calf Region  No depletion  Edema (RD Assessment)  None  Hair  Reviewed  Eyes  Reviewed  Mouth  Reviewed  Skin  Reviewed  Nails  Reviewed       Diet Order:   Diet Order           Diet regular Room service appropriate? Yes; Fluid consistency: Thin  Diet effective now              EDUCATION NEEDS:   No education needs have been identified at this time  Skin:  Skin Assessment: Skin Integrity Issues: Skin Integrity Issues:: Incisions Incisions: L leg & hip  Last BM:  unknown  Height:   Ht Readings from Last 1 Encounters:  04/13/18 5\' 2"  (1.575 m)    Weight:   Wt Readings from Last 1 Encounters:  04/13/18 75 kg    Ideal Body Weight:  50 kg  BMI:  Body mass index is 30.24 kg/m.  Estimated Nutritional Needs:   Kcal:  1550-1750  Protein:  75-90 gm  Fluid:  1.6-1.8 L    Joaquin Courts, RD, LDN, CNSC Pager [phone] After Hours Pager [phone]

## 2018-04-15 NOTE — Clinical Social Work Note (Signed)
Clinical Social Work Assessment  Patient Details  Name: Makayla Thomas MRN: [id number] Date of Birth: [birth year]  Date of referral:  04/15/18               Reason for consult:  Facility Placement                Permission sought to share information with:  Family Supports Permission granted to share information::     Name::     Brett Canales  Agency::     Relationship::  Son  Solicitor Information:     Housing/Transportation Living arrangements for the past 2 months:  ([employer]) Source of Information:  Adult Children Patient Interpreter Needed:  None Criminal Activity/Legal Involvement Pertinent to Current Situation/Hospitalization:  No - Comment as needed Significant Relationships:  Adult Children Lives with:  Self Do you feel safe going back to the place where you live?  No Need for family participation in patient care:  No (Coment)  Care giving concerns:  Pt is only alert to self. CSW spoke with pt's son at bedside.   Social Worker assessment / plan:  Pt son states he is agreeable for pt to go to SNF at d/c. IN order his preferences are [facility], [facility], [facility], [facility], and [facility]. CSW to reach out to facilities and follow up with pt's son.  Employment status:  Retired Health and safety inspector:  Medicare PT Recommendations:  Skilled Nursing Facility Information / Referral to community resources:  Skilled Nursing Facility  Patient/Family's Response to care:  Pt's son verbalized understanding of CSW role and expressed appreciation for support. Pt's son denies any concern regarding pt care at this time.   Patient/Family's Understanding of and Emotional Response to Diagnosis, Current Treatment, and Prognosis:  Pt's son understanding and realistic regarding pt's physical limitations. Pt's son understands the need for pt to d/c to SNF--Pt's son agreeable at this time. Pt's son denies any concern regarding pt's treatment plan at this time. CSW will continue to provide support  and facilitate d/c needs.   Emotional Assessment Appearance:  Appears stated age Attitude/Demeanor/Rapport:  Unable to Assess Affect (typically observed):  Unable to Assess Orientation:  Oriented to Self Alcohol / Substance use:  Not Applicable Psych involvement (Current and /or in the community):  No (Comment)  Discharge Needs  Concerns to be addressed:  Care Coordination, Basic Needs Readmission within the last 30 days:  No Current discharge risk:  Dependent with Mobility Barriers to Discharge:  Continued Medical Work up   [employer], LCSW 04/15/2018, 1:15 PM

## 2018-04-15 NOTE — Discharge Instructions (Signed)

## 2018-04-15 NOTE — Evaluation (Signed)
Physical Therapy Evaluation Patient Details Name: Makayla Thomas MRN: [id number] DOB: [birth year] Today's Date: 04/15/2018   History of Present Illness  84yo female who fell in her ALF; imaging positive for periprosthetic femur fracture, L bimalleolar ankle fracture. She received surgery for placement of L LE ankle and femur ORIFs on 04/14/18. PMH aortic stenosis, DM, MS, HTN, hypothyroidism, A-fib, TIA, visual disturbance, L TKR, hx femur ORIF, R THA   Clinical Impression   Patient received in bed, very pleasant and willing to participate in PT but A&Ox2 this afternoon. She requires MaxAx2 for functional bed mobilty, and totalAx2 to attempt partial sit to stand transfers with NWB L LE. She often demonstrates significant forward lean and required Mod VC and TC to come to full upright posture, able to correct but did not maintain upright well when cues were removed. Able to perform lateral scooting along side of bed with ModAx2 and NWB L LE, and was significantly fatigued at the end of this session. She will continue to benefit from skilled PT services in the acute setting as well as ongoing skilled PT services in the ST-SNF setting moving forward.     Follow Up Recommendations SNF    Equipment Recommendations  Other (comment)(defer to next venue )    Recommendations for Other Services       Precautions / Restrictions Precautions Precautions: Fall Restrictions Weight Bearing Restrictions: Yes LLE Weight Bearing: Non weight bearing Other Position/Activity Restrictions: has short leg splint L LE       Mobility  Bed Mobility Overal bed mobility: Needs Assistance Bed Mobility: Supine to Sit;Sit to Supine     Supine to sit: Max assist;+2 for physical assistance Sit to supine: Max assist;+2 for physical assistance   General bed mobility comments: extended time due to L LE pain   Transfers Overall transfer level: Needs assistance Equipment used: Rolling walker (2 wheeled) Transfers:  Sit to/from Stand;Lateral/Scoot Transfers Sit to Stand: Total assist;+2 physical assistance        Lateral/Scoot Transfers: Mod assist;+2 physical assistance General transfer comment: required totalAx2 for partial sit to stand, unable to come to full upright due to pain but actually did well keeping weight off of L LE during partial transfer; note R LE is historically her weaker leg and can buckle   Ambulation/Gait             General Gait Details: DNT, unable due to safety concerns   Stairs            Wheelchair Mobility    Modified Rankin (Stroke Patients Only)       Balance Overall balance assessment: Needs assistance Sitting-balance support: Bilateral upper extremity supported;Feet supported Sitting balance-Leahy Scale: Poor Sitting balance - Comments: frequently leans forward excessively, requires Mod verbal and tactile cues for upright posture  Postural control: Other (comment)(forward lean ) Standing balance support: Bilateral upper extremity supported;During functional activity Standing balance-Leahy Scale: Poor                               Pertinent Vitals/Pain Pain Assessment: Faces Faces Pain Scale: Hurts little more Pain Location: L LE  Pain Descriptors / Indicators: Aching;Sore;Throbbing Pain Intervention(s): Limited activity within patient's tolerance;Monitored during session;Repositioned    Home Living Family/patient expects to be discharged to:: Assisted living Living Arrangements: Alone             Home Equipment: Dan Humphreys - 2 wheels;Cane - single point;Shower seat;Grab bars -  toilet Additional Comments: in ALF, does not have to cook     Prior Function Level of Independence: Independent with assistive device(s)               Hand Dominance        Extremity/Trunk Assessment   Upper Extremity Assessment Upper Extremity Assessment: Defer to OT evaluation    Lower Extremity Assessment Lower Extremity Assessment:  Generalized weakness    Cervical / Trunk Assessment Cervical / Trunk Assessment: Kyphotic  Communication   Communication: No difficulties  Cognition Arousal/Alertness: Awake/alert Behavior During Therapy: Flat affect;Impulsive Overall Cognitive Status: Impaired/Different from baseline Area of Impairment: Orientation;Attention;Safety/judgement;Problem solving                 Orientation Level: Person;Situation Current Attention Level: Sustained     Safety/Judgement: Decreased awareness of safety;Decreased awareness of deficits   Problem Solving: Decreased initiation;Difficulty sequencing;Requires verbal cues;Slow processing        General Comments General comments (skin integrity, edema, etc.): R LE is historically her weak leg and can buckle per family     Exercises     Assessment/Plan    PT Assessment Patient needs continued PT services  PT Problem List Decreased strength;Decreased knowledge of use of DME;Decreased activity tolerance;Decreased safety awareness;Decreased balance;Pain;Decreased mobility;Decreased coordination       PT Treatment Interventions DME instruction;Balance training;Gait training;Neuromuscular re-education;Stair training;Functional mobility training;Patient/family education;Therapeutic activities;Therapeutic exercise;Manual techniques    PT Goals (Current goals can be found in the Care Plan section)  Acute Rehab PT Goals Patient Stated Goal: less pain in leg  PT Goal Formulation: With patient/family Time For Goal Achievement: 04/29/18 Potential to Achieve Goals: Fair    Frequency Min 2X/week   Barriers to discharge        Co-evaluation PT/OT/SLP Co-Evaluation/Treatment: Yes Reason for Co-Treatment: Complexity of the patient's impairments (multi-system involvement);For patient/therapist safety;To address functional/ADL transfers PT goals addressed during session: Mobility/safety with mobility;Balance;Proper use of  DME;Strengthening/ROM         AM-PAC PT "6 Clicks" Daily Activity  Outcome Measure Difficulty turning over in bed (including adjusting bedclothes, sheets and blankets)?: Unable Difficulty moving from lying on back to sitting on the side of the bed? : Unable Difficulty sitting down on and standing up from a chair with arms (e.g., wheelchair, bedside commode, etc,.)?: Unable Help needed moving to and from a bed to chair (including a wheelchair)?: A Lot Help needed walking in hospital room?: Total Help needed climbing 3-5 steps with a railing? : Total 6 Click Score: 7    End of Session Equipment Utilized During Treatment: Gait belt Activity Tolerance: Patient tolerated treatment well;Patient limited by pain Patient left: in bed;with call bell/phone within reach;with bed alarm set;with family/visitor present Nurse Communication: Mobility status PT Visit Diagnosis: Unsteadiness on feet (R26.81);Difficulty in walking, not elsewhere classified (R26.2);History of falling (Z91.81);Muscle weakness (generalized) (M62.81)    Time: 1610-9604 PT Time Calculation (min) (ACUTE ONLY): 37 min   Charges:   PT Evaluation $PT Eval Moderate Complexity: 1 Mod          Nedra Hai PT, DPT, CBIS  Supplemental Physical Therapist [facility]   Pager [phone]

## 2018-04-15 NOTE — Progress Notes (Signed)
ANTICOAGULATION CONSULT NOTE - Initial Consult  Pharmacy Consult for Xarelto Indication: atrial fibrillation and history of TIA  Allergies  Allergen Reactions  . Tetanus Antitoxin Anaphylaxis    Throat swelling  . Tramadol Other (See Comments)    HALLUCINATIONS; "and paranoia"  . Augmentin [Amoxicillin-Pot Clavulanate]     UNSPECIFIED REACTION   . Codeine     UNSPECIFIED REACTION   . Livalo [Pitavastatin]     UNSPECIFIED REACTION   . Meclizine Other (See Comments)    Had funny feeling    Patient Measurements: Height: 5' 2"  (157.5 cm) Weight: 165 lb 5.5 oz (75 kg) IBW/kg (Calculated) : 50.1  Vital Signs: Temp: 98 F (36.7 C) (08/30 0853) Temp Source: Oral (08/30 0853) BP: 121/64 (08/30 0853) Pulse Rate: 94 (08/30 0853)  Labs: Recent Labs    04/13/18 0613 04/13/18 0632 04/14/18 1447 04/15/18 0447  HGB 13.9  --  12.5 10.7*  HCT 43.0  --  38.3 33.1*  PLT 247  --  249 225  LABPROT  --  23.7*  --   --   INR  --  2.14  --   --   CREATININE 1.41*  --   --  1.00    Estimated Creatinine Clearance: 35.5 mL/min (by C-G formula based on SCr of 1 mg/dL).   Medical History: Past Medical History:  Diagnosis Date  . Anemia   . Aortic stenosis, moderate (was mild) 08/2009; 08/2014   a. ECHO  EF 55-60%, wth increased EDP, mild AS;; b. mild Conc LVH, EF 60-65%, DD with high LVEDP, MIld-Mod AS, Mod MAC with Mod MR, mild-mod PA HTN.  [name] Complication of anesthesia    hallusinations  . Constipation   . Diabetes mellitus without complication ([place])    possibly  . Dyslipidemia    treated  . H/O cardiovascular stress test 05/2010   Lexiscan cardiolite no ischemia or infarction  . H/O multiple sclerosis    no excaerbations in some time  . History of ETT 02/2011   [place] exercise protocol, negative with poor exercise tolerance  . History of transfusion   . Hypertension   . Hypothyroidism   . Lower extremity edema    chronic  . Osteoarthritis    "generalized" (04/13/2018)   . PAD (peripheral artery disease) ([place]) 08/30/2009   Dopplers; with bil. post. tib artery occlusion  . Permanent atrial fibrillation (HCC)    on Xarelto  . Stroke [facility]) ~2011/2012   "memory issues since" (04/13/2018)  . Transient ischemic attack (TIA) "she's had several"   "memory issues since" (04/13/2018)  . Vaginal itching   . Visual disturbances    "FLASHING IN MY EYES"    Assessment: 84 year old female s/p mechanical fall prior to admission and admitted with anxle and femur fracture for surgical repair. Patient was on Xarelto for atrial fibrillation which was held for surgery. Surgery occurred 8/29 and now pharmacy consulted to resume Xarelto.   Hgb 10.7 - slight trend down post operatively. Platelets are stable. No bleeding noted. SCr down to 1 with estimated CrCl ~ 35 mL/min.   Goal of Therapy:  Monitor platelets by anticoagulation protocol: Yes   Plan:  Resume Xarelto 15 mg po daily.  Monitor CBC and for signs/sympotoms of bleeding.  Monitor renal function.   Sloan Leiter, PharmD, BCPS, BCCCP Clinical Pharmacist Clinical phone 04/15/2018 until 3:30PM [phone] Please refer to [facility] for [place] numbers 04/15/2018,10:59 AM

## 2018-04-15 NOTE — Evaluation (Signed)
Occupational Therapy Evaluation Patient Details Name: Makayla Thomas MRN: [id number] DOB: 05-27-1926 Today's Date: 04/15/2018    History of Present Illness 84yo female who fell in her ALF; imaging positive for periprosthetic femur fracture, L bimalleolar ankle fracture. She received surgery for placement of L LE ankle and femur ORIFs on 04/14/18. PMH aortic stenosis, DM, MS, HTN, hypothyroidism, A-fib, TIA, visual disturbance, L TKR, hx femur ORIF, R THA    Clinical Impression   This 84 y/o female presents with the above. At baseline pt lives in ILF, was completing ADLs and functional mobility with mod independence using RW. Pt presenting with increased pain in LLE, cognitive impairments, decreased static and dynamic balance. Pt requiring maxA+2 for bed mobility, totalA+2 for partial sit<>stand from EOB as pt unable to achieve full upright position due to pain with transitions. Practiced lateral scoot along EOB during session with pt able to perform with heavy modA+2 and VCs throughout. Pt requires consistent multimodal cueing during session for safety, sequencing and to follow instructions; despite cognitive impairments pt with fair recall of need to maintain NWB in LLE. She currently requires consistent minguard-minA for static sitting balance EOB due to tendency to demonstrate forward flexed posture. Pt requires minA for seated UB ADLs, max-totalA for LB ADLs. Will benefit from continued acute OT services and recommend follow up therapy services in SNF setting after discharge to maximize her safety and independence with ADLs and mobility prior to return home. Will follow.    Follow Up Recommendations  SNF;Supervision/Assistance - 24 hour    Equipment Recommendations  3 in 1 bedside commode;Other (comment)(TBD in next venue )           Precautions / Restrictions Precautions Precautions: Fall Precaution Comments: family reports increased number of falls recently at home  Restrictions Weight  Bearing Restrictions: Yes LLE Weight Bearing: Non weight bearing Other Position/Activity Restrictions: has short leg splint L LE       Mobility Bed Mobility Overal bed mobility: Needs Assistance Bed Mobility: Supine to Sit;Sit to Supine     Supine to sit: Max assist;+2 for physical assistance Sit to supine: Max assist;+2 for physical assistance   General bed mobility comments: extended time due to L LE pain, assist for LLE over EOB and to elevate trunk, use of bed pad to scoot hips; use of helicoptor method for trunk/LE support when returning to supine   Transfers Overall transfer level: Needs assistance Equipment used: Rolling walker (2 wheeled) Transfers: Sit to/from Stand;Lateral/Scoot Transfers Sit to Stand: Total assist;+2 physical assistance        Lateral/Scoot Transfers: Mod assist;+2 physical assistance General transfer comment: required totalAx2 for partial sit to stand, unable to come to full upright due to pain but actually did well keeping weight off of L LE during transition; per family R LE is historically her weaker leg and can buckle. Practiced lateral scoot along EOB as precursor for lateral/scoot transfers to drop arm recliner (currently without drop arm in room); pt requiring heavymodA+2 to scoot along EOB with VCs for sequencing, technique and to ensure maintaining NWB in LLE     Balance Overall balance assessment: Needs assistance Sitting-balance support: Bilateral upper extremity supported;Feet supported Sitting balance-Leahy Scale: Poor Sitting balance - Comments: frequently leans forward excessively, requires Mod verbal and tactile cues for upright posture  Postural control: Other (comment)(foward lean/forward flexed posture ) Standing balance support: Bilateral upper extremity supported;During functional activity Standing balance-Leahy Scale: Poor  ADL either performed or assessed with clinical judgement   ADL  Overall ADL's : Needs assistance/impaired Eating/Feeding: Set up;Sitting;Bed level   Grooming: Set up;Bed level;Wash/dry face   Upper Body Bathing: Minimal assistance;Sitting   Lower Body Bathing: Maximal assistance;+2 for safety/equipment;+2 for physical assistance;Sitting/lateral leans   Upper Body Dressing : Minimal assistance;Sitting   Lower Body Dressing: Maximal assistance;+2 for physical assistance;+2 for safety/equipment;Sitting/lateral leans       Toileting- Clothing Manipulation and Hygiene: Total assistance;Bed level;+2 for physical assistance;+2 for safety/equipment         General ADL Comments: pt performing bed mobility and sitting EOB >65min during session. attempted sit<>stand with pt requiring totalA+2 for partial sit<>stand, unable to achieve full standing position; practiced lateral scoot along EOB as feel pt will be safest with lateral/scoot transfers to drop arm recliner for OOB attempts (currently does not have drop arm recliner in room), pt requiring heavy mod-maxA+2 for lateral scoot; pt requires consistent minguard-minA for static sitting balance as she frequently tends to demonstrate forward flexed posture                          Pertinent Vitals/Pain Pain Assessment: Faces Faces Pain Scale: Hurts little more Pain Location: L LE  Pain Descriptors / Indicators: Aching;Sore;Throbbing Pain Intervention(s): Monitored during session;Repositioned;Limited activity within patient's tolerance     Hand Dominance     Extremity/Trunk Assessment Upper Extremity Assessment Upper Extremity Assessment: Generalized weakness   Lower Extremity Assessment Lower Extremity Assessment: Defer to PT evaluation(son reports RLE is her "weak leg", per family report LLE was her stronger leg)   Cervical / Trunk Assessment Cervical / Trunk Assessment: Kyphotic   Communication Communication Communication: No difficulties   Cognition Arousal/Alertness:  Awake/alert Behavior During Therapy: Impulsive Overall Cognitive Status: Impaired/Different from baseline Area of Impairment: Orientation;Attention;Safety/judgement;Problem solving;Following commands                 Orientation Level: Disoriented to;Place;Time Current Attention Level: Sustained   Following Commands: Follows one step commands with increased time Safety/Judgement: Decreased awareness of safety;Decreased awareness of deficits   Problem Solving: Decreased initiation;Difficulty sequencing;Requires verbal cues;Slow processing General Comments: pt with baseline cognitive impairments, family present and reports cognition appears to have worsened recently (right up to this hospitalization and during hospitalization), especially regarding orientation to time, situation. Pt requires multimodal cues for safety and to follow instructions; also noted to have difficulty sequencing simple tasks   General Comments  R LE is historically her weak leg and can buckle per family     Exercises     Shoulder Instructions      Home Living Family/patient expects to be discharged to:: Assisted living Living Arrangements: Alone                           Home Equipment: Dan Humphreys - 2 wheels;Cane - single point;Shower seat;Grab bars - toilet   Additional Comments: in ALF, does not have to cook       Prior Functioning/Environment Level of Independence: Independent with assistive device(s)                 OT Problem List: Decreased strength;Impaired balance (sitting and/or standing);Decreased cognition;Pain;Decreased knowledge of precautions;Decreased activity tolerance;Decreased knowledge of use of DME or AE      OT Treatment/Interventions: Self-care/ADL training;DME and/or AE instruction;Therapeutic activities;Balance training;Therapeutic exercise;Patient/family education    OT Goals(Current goals can be found in the care plan section)  Acute Rehab OT Goals Patient  Stated Goal: less pain in leg  OT Goal Formulation: With patient Time For Goal Achievement: 04/29/18 Potential to Achieve Goals: Good  OT Frequency: Min 2X/week   Barriers to D/C:            Co-evaluation PT/OT/SLP Co-Evaluation/Treatment: Yes Reason for Co-Treatment: Complexity of the patient's impairments (multi-system involvement);For patient/therapist safety;To address functional/ADL transfers PT goals addressed during session: Mobility/safety with mobility;Balance;Proper use of DME;Strengthening/ROM OT goals addressed during session: ADL's and self-care;Strengthening/ROM      AM-PAC PT "6 Clicks" Daily Activity     Outcome Measure Help from another person eating meals?: A Little Help from another person taking care of personal grooming?: A Little Help from another person toileting, which includes using toliet, bedpan, or urinal?: Total Help from another person bathing (including washing, rinsing, drying)?: A Lot Help from another person to put on and taking off regular upper body clothing?: A Lot Help from another person to put on and taking off regular lower body clothing?: Total 6 Click Score: 12   End of Session Nurse Communication: Mobility status  Activity Tolerance: Patient tolerated treatment well Patient left: in bed;with call bell/phone within reach;with bed alarm set  OT Visit Diagnosis: Muscle weakness (generalized) (M62.81);Other abnormalities of gait and mobility (R26.89);Pain Pain - Right/Left: Left Pain - part of body: Knee;Leg                Time: 8657-8469 OT Time Calculation (min): 37 min Charges:  OT General Charges $OT Visit: 1 Visit OT Evaluation $OT Eval Moderate Complexity: 1 Mod  Marcy Siren, [place] Pager [phone] 04/15/2018  Orlando Penner 04/15/2018, 3:31 PM

## 2018-04-15 NOTE — Plan of Care (Signed)

## 2018-04-15 NOTE — Progress Notes (Signed)
PROGRESS NOTE    Makayla Thomas  MVH:[id number] DOB: 03-02-1926 DOA: 04/13/2018 PCP: Merri Brunette, MD    Brief Narrative:: Makayla Thomas is a 84 y.o. female with medical history significant of TIA; afib on Xarelto; moderate AS; PAD; hypothyroidism; HTN;  MS; HLD; and DM presenting with tibial fracture.  She is unable to provide history due to her dementia.     son [name])  reports that she has been receiving all of her medications packaged today through the pharmacy.  The family realized recently that the package did not include some of her pills and she had not been taking bisoprolol and another unspecified medication for at least a week or more.  The pharmacy provided the packaged medications and 2 "loose pills" starting last week.   Her son saw her last week and she was doing fairly well.  He thinks this fall might be related to resumption of bisoprolol.  In the last week, she has really gone downhill - he thinks it was related to this medication change.  As a result, the family called Dr. Elissa Hefty office and he recommended stopping Cardizem instead of Bisoprolol - this has not happened yet.  At her 89th birthday party, they also discussed code status - but she would not commit.  Her sons decided together that she should be DNR.  After her prior hip replacement, post-anesthesia or with pain medication, she had serious hallucinations.  ED Course:   Mechanical fall today.  On Xarelto for afib, mild dementia.  Fall on left side - periprosthetic femur fracture, left bimalleolar fracture.  [place].  Orthopedics consulted, possible ORIF for both this afternoon.   Assessment & Plan:   Principal Problem:   Peri-prosthetic fracture of femur at tip of prosthesis Active Problems:   Permanent atrial fibrillation (HCC); CHA2DS2-VASc Score 6   Chronic anticoagulation   Dyslipidemia, goal LDL below 100 - due aortic stenosis   Essential hypertension -well controlled  Hypothyroidism   Trimalleolar fracture of ankle, closed, left, initial encounter   CKD (chronic kidney disease) stage 3, GFR 30-59 ml/min (HCC)   Hyperglycemia   Dementia  Left Hip and trimalleolar ankle fracture;  PT per ortho.  Post op  8-29. S/P ORIF, left bimalleolar ankle fracture post ORIF.  Pain management.  Schedule miralax for bowel regimen,  Ok to resume xarelto.  Pain management, avoid oversedation.  Ancef for 24 hours.   Permanent A fib;  Discussed with Dr Herbie [place], patient ; son concern regarding Cardizem and making patient sleepy and tired. Plan to hold Cardizem for now. Continue with low dose bisoprolol 5 mg daily and if Hr increases increase bisprolol to 10.  -Patient was taking bisoprolol 10 mg at home. HR this am 90---100. Will increase bisoprolol to 10 mg daily.  Resume xarelto.    Confusion;  Worsening confusion, weakness over last 3 week. Some changes in cognition for last 3 months.  B 12 normal. .  TSH normal.   Vitamin D level.   Mild to moderate aortic stenosis Monitor./  Out patient follow up.   HTN; monitor post op.  Hold triamterene for  now.    Acute blood loss anemia; post sx. Expected.  Repeat hb in am./    DVT prophylaxis: resume xarelto.  Code Status: DNR Family Communication: ( son at bedside.  Disposition Plan: she will need rehab.   Consultants:   Ortho  Cardiology    Procedures:      Antimicrobials:  prophylaxis    Subjective: She is alert, conversant.  She is confuse.   Objective: Vitals:   04/14/18 1925 04/15/18 0015 04/15/18 0354 04/15/18 0853  BP: 106/73 112/61 108/74 121/64  Pulse: 64 74 72 94  Resp: 16 14 16    Temp: 99.9 F (37.7 C) 98.6 F (37 C) 98.2 F (36.8 C) 98 F (36.7 C)  TempSrc: Oral Oral Oral Oral  SpO2: 93% 98% 98% 99%  Weight:      Height:        Intake/Output Summary (Last 24 hours) at 04/15/2018 1002 Last data filed at 04/15/2018 0407 Gross per 24 hour  Intake 400 ml  Output  1300 ml  Net -900 ml   Filed Weights   04/13/18 0545  Weight: 75 kg    Examination:  General exam: NAD Respiratory system: CTA Cardiovascular system: S 1, S 2 RRR Gastrointestinal system: BS present, soft, nt Central nervous system: alert, follows command  Extremities; left LE with dressing.  Skin: No rashes, lesions or ulcers   Data Reviewed: I have personally reviewed following labs and imaging studies  CBC: Recent Labs  Lab 04/13/18 0613 04/14/18 1447 04/15/18 0447  WBC 8.6 14.1* 12.1*  NEUTROABS 6.5  --   --   HGB 13.9 12.5 10.7*  HCT 43.0 38.3 33.1*  MCV 96.2 94.6 94.3  PLT 247 249 225   Basic Metabolic Panel: Recent Labs  Lab 04/13/18 0613 04/15/18 0447  NA 140 140  K 4.1 3.9  CL 105 105  CO2 27 28  GLUCOSE 147* 142*  BUN 20 16  CREATININE 1.41* 1.00  CALCIUM 9.3 8.7*   GFR: Estimated Creatinine Clearance: 35.5 mL/min (by C-G formula based on SCr of 1 mg/dL). Liver Function Tests: No results for input(s): AST, ALT, ALKPHOS, BILITOT, PROT, ALBUMIN in the last 168 hours. No results for input(s): LIPASE, AMYLASE in the last 168 hours. No results for input(s): AMMONIA in the last 168 hours. Coagulation Profile: Recent Labs  Lab 04/13/18 0632  INR 2.14   Cardiac Enzymes: No results for input(s): CKTOTAL, CKMB, CKMBINDEX, TROPONINI in the last 168 hours. BNP (last 3 results) No results for input(s): PROBNP in the last 8760 hours. HbA1C: No results for input(s): HGBA1C in the last 72 hours. CBG: Recent Labs  Lab 04/14/18 1015  GLUCAP 149*   Lipid Profile: Recent Labs    04/13/18 1107  CHOL 212*  HDL 47  LDLCALC 143*  TRIG 110  CHOLHDL 4.5   Thyroid Function Tests: Recent Labs    04/13/18 1107  TSH 1.005  FREET4 1.74   Anemia Panel: Recent Labs    04/15/18 0447  VITAMINB12 687   Sepsis Labs: No results for input(s): PROCALCITON, LATICACIDVEN in the last 168 hours.  Recent Results (from the past 240 hour(s))  MRSA PCR  Screening     Status: None   Collection Time: 04/13/18  6:34 PM  Result Value Ref Range Status   MRSA by PCR NEGATIVE NEGATIVE Final    Comment:        The GeneXpert MRSA Assay (FDA approved for NASAL specimens only), is one component of a comprehensive MRSA colonization surveillance program. It is not intended to diagnose MRSA infection nor to guide or monitor treatment for MRSA infections. Performed at [facility], [address], [place], [place] [address]          Radiology Studies: Dg Ankle 2 Views Left  Result Date: 04/14/2018 CLINICAL DATA:  ORIF Left Ankle  EXAM: LEFT ANKLE - 2 VIEW COMPARISON:  Plain films dated 04/13/2018. FINDINGS: Six intraoperative fluoroscopic images are provided. Final images show plate and screw fixation hardware of the distal tibia and fibula. Hardware appears intact and appropriately positioned. Osseous alignment is significantly improved compared to earlier plain films. Fluoroscopy was provided for 73 seconds. IMPRESSION: Intraoperative films demonstrating plate and screw fixation hardware of the distal LEFT tibia and fibula. No evidence of surgical complicating feature. Electronically Signed   By: Bary Richard M.D.   On: 04/14/2018 13:49   Dg Ankle Complete Left  Result Date: 04/14/2018 CLINICAL DATA:  S/p ORIF left ankle EXAM: LEFT ANKLE COMPLETE - 3+ VIEW COMPARISON:  Plain films of the LEFT ankle dated 04/13/2018. FINDINGS: Interval plate and screw fixation hardware of the distal LEFT tibia and fibula. Hardware appears intact and appropriately positioned. Osseous alignment is significantly improved. Ankle mortise is now symmetric. Overlying casting in place. IMPRESSION: Significantly improved alignment of the distal LEFT tibia and fibula status post plate and screw fixation. Electronically Signed   By: Bary Richard M.D.   On: 04/14/2018 13:54   Dg C-arm 1-60 Min  Result Date: 04/14/2018 CLINICAL DATA:  ORIF DISTAL LEFT FEMUR EXAM: DG  C-ARM 61-120 MIN COMPARISON:  None. FINDINGS: Intraoperative fluoroscopic images demonstrating plate and screw fixation hardware of the LEFT femur fracture. Final images demonstrated significantly improved osseous alignment. Fluoroscopy was provided for 87 seconds. IMPRESSION: Intraoperative fluoroscopic images demonstrating plate and screw fixation hardware of the LEFT femur. Fluoroscopy provided for the procedure. Electronically Signed   By: Bary Richard M.D.   On: 04/14/2018 13:53   Dg C-arm 1-60 Min  Result Date: 04/14/2018 CLINICAL DATA:  ORIF Left Ankle EXAM: DG C-ARM 61-120 MIN COMPARISON:  Plain films of the LEFT ankle dated 04/13/2018. FINDINGS: Intraoperative fluoroscopic images demonstrating plate and screw fixation hardware of the distal LEFT tibia and fibula. Hardware appears intact and appropriately positioned. Osseous alignment is improved compared to earlier plain films. Fluoroscopy was provided for 73 seconds. IMPRESSION: Intraoperative images demonstrating plate and screw fixation hardware of the distal LEFT tibia and fibula. Fluoroscopy provided for the procedure. Electronically Signed   By: Bary Richard M.D.   On: 04/14/2018 13:50   Dg Femur Min 2 Views Left  Result Date: 04/14/2018 CLINICAL DATA:  ORIF DISTAL LEFT FEMUR EXAM: LEFT FEMUR 2 VIEWS COMPARISON:  Plain films of the femur dated 04/13/2018. FINDINGS: Six intraoperative fluoroscopic images are provided of the LEFT femur. Final images demonstrate plate and screw fixation of the LEFT femur, traversing the displaced/comminuted fracture within the distal LEFT femur. Hardware appears intact and appropriately positioned. Fluoroscopy was provided for 87 seconds. IMPRESSION: Intraoperative images demonstrating plate and screw fixation of the LEFT femur. Hardware appears intact and appropriately positioned. No evidence of surgical complicating feature. Electronically Signed   By: Bary Richard M.D.   On: 04/14/2018 13:52   Dg Femur  Port Min 2 Views Left  Result Date: 04/14/2018 CLINICAL DATA:  S/P ORIF left femur EXAM: LEFT FEMUR PORTABLE 2 VIEWS COMPARISON:  Plain films dated 04/13/2018. FINDINGS: Status post plate and screw fixation of the LEFT femur, traversing the displaced/comminuted fracture within the distal LEFT femur. Hardware appears intact and appropriately positioned. Osseous alignment is significantly improved. No evidence of surgical complicating feature. IMPRESSION: Status post plate and screw fixation of the LEFT femur. Significantly improved osseous alignment. No evidence of surgical complicating feature. Electronically Signed   By: Bary Richard M.D.   On: 04/14/2018 13:56  Scheduled Meds: . [START ON 04/16/2018] bisoprolol  10 mg Oral Daily  . bisoprolol  5 mg Oral Once  . docusate sodium  100 mg Oral BID  . levothyroxine  125 mcg Oral QAC breakfast  . polyethylene glycol  17 g Oral Daily  . trimethoprim  100 mg Oral Daily   Continuous Infusions: . lactated ringers 75 mL/hr at 04/15/18 0542  . methocarbamol (ROBAXIN) IV 500 mg (04/14/18 2356)     LOS: 2 days    Time spent: 35 minutes.     Alba Cory, MD [facility] Pager [phone]  If 7PM-7AM, please contact night-coverage [email or web address] Password [email or web address] 04/15/2018, 10:02 AM

## 2018-04-15 NOTE — Progress Notes (Signed)
Orthopaedic Trauma Progress Note  S: Doing okay. Talking about medications and stating about "prison"  O:  Vitals:   04/15/18 0015 04/15/18 0354  BP: 112/61 108/74  Pulse: 74 72  Resp: 14 16  Temp: 98.6 F (37 C) 98.2 F (36.8 C)  SpO2: 98% 98%   LLE: Dressing clean, dry and intact. Wiggles toes. Endorese sensation to toes. Warm and well perfused foot. Compartments soft and compressible.  Imaging: Sable postoperative imaging  Labs:  Results for orders placed or performed during the hospital encounter of 04/13/18 (from the past 24 hour(s))  Glucose, capillary     Status: Abnormal   Collection Time: 04/14/18 10:15 AM  Result Value Ref Range   Glucose-Capillary 149 (H) 70 - 99 mg/dL   Comment 1 Notify RN   CBC     Status: Abnormal   Collection Time: 04/14/18  2:47 PM  Result Value Ref Range   WBC 14.1 (H) 4.0 - 10.5 K/uL   RBC 4.05 3.87 - 5.11 MIL/uL   Hemoglobin 12.5 12.0 - 15.0 g/dL   HCT 16.1 09.6 - 04.5 %   MCV 94.6 78.0 - 100.0 fL   MCH 30.9 26.0 - 34.0 pg   MCHC 32.6 30.0 - 36.0 g/dL   RDW 40.9 81.1 - 91.4 %   Platelets 249 150 - 400 K/uL  CBC     Status: Abnormal   Collection Time: 04/15/18  4:47 AM  Result Value Ref Range   WBC 12.1 (H) 4.0 - 10.5 K/uL   RBC 3.51 (L) 3.87 - 5.11 MIL/uL   Hemoglobin 10.7 (L) 12.0 - 15.0 g/dL   HCT 78.2 (L) 95.6 - 21.3 %   MCV 94.3 78.0 - 100.0 fL   MCH 30.5 26.0 - 34.0 pg   MCHC 32.3 30.0 - 36.0 g/dL   RDW 08.6 57.8 - 46.9 %   Platelets 225 150 - 400 K/uL  Basic metabolic panel     Status: Abnormal   Collection Time: 04/15/18  4:47 AM  Result Value Ref Range   Sodium 140 135 - 145 mmol/L   Potassium 3.9 3.5 - 5.1 mmol/L   Chloride 105 98 - 111 mmol/L   CO2 28 22 - 32 mmol/L   Glucose, Bld 142 (H) 70 - 99 mg/dL   BUN 16 8 - 23 mg/dL   Creatinine, Ser 6.29 0.44 - 1.00 mg/dL   Calcium 8.7 (L) 8.9 - 10.3 mg/dL   GFR calc non Af Amer 48 (L) >60 mL/min   GFR calc Af Amer 56 (L) >60 mL/min   Anion gap 7 5 - 15  Vitamin B12      Status: None   Collection Time: 04/15/18  4:47 AM  Result Value Ref Range   Vitamin B-12 687 180 - 914 pg/mL    Assessment: 84 year old female s/p fall  Injuries: 1. Left periprosthetic distal femur fracture s/p ORIF 2. Left bimalleolar ankle fracture s/p ORIF   Weightbearing: NWB RLE  Insicional and dressing care: Keep splint clean, dry and intact  Orthopedic device(s):None  CV/Blood loss:Hgb 10.7 this AM, hemodynamically stable overnight   Pain management: 1. Oxycodone 5-10 mg q 4 hours PRN 2. Morphine 0.5 mg q 2 hours PRN 3. Robaxin 500 mg 1 6 hours PRN  VTE prophylaxis: Currently on heparin SQ q 8 hrs, Okay to restart xeralto today from orthopaedic perspective.  ID: Ancef for 24 hours  Foley/Lines: Discontinue foley, IVF per hospitalist currently LR at 75 ml/hr  Medical  co-morbidities: 1. A-fib-Medications per cardiology and hospitalist 2. Hypothyroid-synthoid home dosing  Impediments to Fracture Healing: Osteoporosis  Dispo: PT/OT eval, likely SNF  Follow - up plan: Follow up in 2 weeks for x-rays and suture removal   Roby Lofts, MD [facility] [phone] (phone)

## 2018-04-15 NOTE — Clinical Social Work Note (Signed)
Pt's family has toured and chosen bed at [facility]. Pt can d/c over weekend if medically stable, facility prepared. MD please sign DNR.  Velora Mediate, MSW [phone]

## 2018-04-15 NOTE — NC FL2 (Signed)
[place] MEDICAID FL2 LEVEL OF CARE SCREENING TOOL     IDENTIFICATION  Patient Name: Makayla Thomas Birthdate: 1925-08-31 Sex: female Admission Date (Current Location): 04/13/2018  [facility] and [place] Number:  Producer, television/film/video and Address:  [facility], [address], [place], [place] [address]      Provider Number: [id number]  Attending Physician Name and Address:  Alba Cory, MD  Relative Name and Phone Number:       Current Level of Care: Hospital Recommended Level of Care: Skilled Nursing Facility Prior Approval Number:    Date Approved/Denied:   PASRR Number:  [id number]   Discharge Plan: SNF    Current Diagnoses: Patient Active Problem List   Diagnosis Date Noted  . Peri-prosthetic fracture of femur at tip of prosthesis 04/13/2018  . Trimalleolar fracture of ankle, closed, left, initial encounter 04/13/2018  . CKD (chronic kidney disease) stage 3, GFR 30-59 ml/min (HCC) 04/13/2018  . Hyperglycemia 04/13/2018  . Dementia 04/13/2018  . Periprosthetic fracture around internal prosthetic right hip joint (HCC) 02/05/2015  . Hypothyroidism 02/05/2015  . Obese 01/30/2015  . S/P right THA, AA 01/29/2015  . Rectal bleeding   . Anticoagulation adequate   . Stroke (HCC)   . Dehydration 08/29/2014  . Nausea and vomiting 08/29/2014  . Diarrhea 08/29/2014  . Hypokalemia 08/29/2014  . Acute renal failure (HCC) 08/29/2014  . Leukocytosis 08/29/2014  . History of TIA (transient ischemic attack) 08/18/2014  . Essential hypertension -well controlled 01/10/2013  . Permanent atrial fibrillation (HCC); CHA2DS2-VASc Score 6   . Chronic anticoagulation   . Dyslipidemia, goal LDL below 100 - due aortic stenosis   . Moderate aortic stenosis 08/17/2009    Orientation RESPIRATION BLADDER Height & Weight     Self  Normal Incontinent, Indwelling catheter(Urethal cath placed 8/28) Weight: 165 lb 5.5 oz (75 kg) Height:  5\' 2"  (157.5 cm)   BEHAVIORAL SYMPTOMS/MOOD NEUROLOGICAL BOWEL NUTRITION STATUS      Continent Diet(Regular diet, thin liquids)  AMBULATORY STATUS COMMUNICATION OF NEEDS Skin   Extensive Assist Verbally Surgical wounds(Closed incision, left hip, adhesive bandage. Closed incision, left leg,  compression wrapped. Closed incision, right hip.)                       Personal Care Assistance Level of Assistance  Bathing, Dressing, Feeding Bathing Assistance: Maximum assistance Feeding assistance: Limited assistance Dressing Assistance: Maximum assistance     Functional Limitations Info  Sight, Hearing, Speech Sight Info: Adequate Hearing Info: Adequate Speech Info: Adequate    SPECIAL CARE FACTORS FREQUENCY  PT (By licensed PT), OT (By licensed OT)     PT Frequency: 2x OT Frequency: 2x            Contractures Contractures Info: Not present    Additional Factors Info  Code Status, Allergies Code Status Info: DNR Allergies Info: Tetanus Antitoxin, Tramadol, Augmentin Amoxicillin-pot Clavulanate, Codeine, Livalo Pitavastatin, Meclizine           Current Medications (04/15/2018):  This is the current hospital active medication list Current Facility-Administered Medications  Medication Dose Route Frequency Provider Last Rate Last Dose  . acetaminophen (TYLENOL) tablet 650 mg  650 mg Oral Q6H PRN Regalado, Belkys A, MD      . bisacodyl (DULCOLAX) EC tablet 5 mg  5 mg Oral Daily PRN Haddix, Gillie Manners, MD      . Melene Muller ON 04/16/2018] bisoprolol (ZEBETA) tablet 10 mg  10 mg Oral  Daily Regalado, Belkys A, MD      . cholecalciferol (VITAMIN D) tablet 1,000 Units  1,000 Units Oral q morning - 10a Regalado, Belkys A, MD   1,000 Units at 04/15/18 1118  . docusate sodium (COLACE) capsule 100 mg  100 mg Oral BID Haddix, Gillie Manners, MD   100 mg at 04/15/18 0837  . feeding supplement (ENSURE ENLIVE) (ENSURE ENLIVE) liquid 237 mL  237 mL Oral BID BM Regalado, Belkys A, MD      . lactated ringers infusion    Intravenous Continuous Haddix, Gillie Manners, MD 75 mL/hr at 04/15/18 0542    . levothyroxine (SYNTHROID, LEVOTHROID) tablet 125 mcg  125 mcg Oral QAC breakfast Haddix, Gillie Manners, MD   125 mcg at 04/15/18 0837  . methocarbamol (ROBAXIN) tablet 500 mg  500 mg Oral Q6H PRN Haddix, Gillie Manners, MD       Or  . methocarbamol (ROBAXIN) 500 mg in dextrose 5 % 50 mL IVPB  500 mg Intravenous Q6H PRN Haddix, Gillie Manners, MD 100 mL/hr at 04/14/18 2356 500 mg at 04/14/18 2356  . morphine 2 MG/ML injection 0.5 mg  0.5 mg Intravenous Q2H PRN Haddix, Gillie Manners, MD      . multivitamin with minerals tablet 1 tablet  1 tablet Oral Daily Regalado, Belkys A, MD   1 tablet at 04/15/18 1118  . oxyCODONE (Oxy IR/ROXICODONE) immediate release tablet 5 mg  5 mg Oral Q6H PRN Regalado, Belkys A, MD      . polyethylene glycol (MIRALAX / GLYCOLAX) packet 17 g  17 g Oral Daily Regalado, Belkys A, MD   17 g at 04/15/18 0837  . Rivaroxaban (XARELTO) tablet 15 mg  15 mg Oral Q supper Quenton Fetter, RPH      . sodium phosphate (FLEET) 7-19 GM/118ML enema 1 enema  1 enema Rectal Once PRN Haddix, Gillie Manners, MD      . trimethoprim (TRIMPEX) tablet 100 mg  100 mg Oral Daily Haddix, Gillie Manners, MD   100 mg at 04/15/18 1610     Discharge Medications: Please see discharge summary for a list of discharge medications.  Relevant Imaging Results:  Relevant Lab Results:   Additional Information SSN: [id number]  Maree Krabbe, LCSW

## 2018-04-16 LAB — CBC
HEMATOCRIT: 30 % — AB (ref 36.0–46.0)
HEMOGLOBIN: 9.6 g/dL — AB (ref 12.0–15.0)
MCH: 30.9 pg (ref 26.0–34.0)
MCHC: 32 g/dL (ref 30.0–36.0)
MCV: 96.5 fL (ref 78.0–100.0)
Platelets: 204 10*3/uL (ref 150–400)
RBC: 3.11 MIL/uL — ABNORMAL LOW (ref 3.87–5.11)
RDW: 13.7 % (ref 11.5–15.5)
WBC: 9.9 10*3/uL (ref 4.0–10.5)

## 2018-04-16 LAB — VITAMIN D 25 HYDROXY (VIT D DEFICIENCY, FRACTURES): Vit D, 25-Hydroxy: 35.6 ng/mL (ref 30.0–100.0)

## 2018-04-16 MED ORDER — FERROUS SULFATE 325 (65 FE) MG PO TABS: 325.0000 mg | ORAL_TABLET | Freq: Two times a day (BID) | ORAL | Status: DC

## 2018-04-16 MED ORDER — BISACODYL 10 MG RE SUPP
10.0000 mg | Freq: Every day | RECTAL | Status: DC | PRN
  Administered 2018-04-16: 10 mg via RECTAL
  Filled 2018-04-16: qty 1

## 2018-04-16 MED ORDER — ACETAMINOPHEN 325 MG PO TABS: 650.0000 mg | ORAL_TABLET | Freq: Four times a day (QID) | ORAL | 0 refills | Status: DC | PRN

## 2018-04-16 MED ORDER — POLYETHYLENE GLYCOL 3350 17 G PO PACK: 17.0000 g | PACK | Freq: Every day | ORAL | 0 refills | Status: DC

## 2018-04-16 MED ORDER — DOCUSATE SODIUM 100 MG PO CAPS: 100.0000 mg | ORAL_CAPSULE | Freq: Two times a day (BID) | ORAL | 0 refills | Status: DC

## 2018-04-16 MED ORDER — ENSURE ENLIVE PO LIQD: 237.0000 mL | Freq: Two times a day (BID) | ORAL | 12 refills | Status: DC

## 2018-04-16 MED ORDER — OXYCODONE HCL 5 MG PO TABS: 2.5000 mg | ORAL_TABLET | Freq: Four times a day (QID) | ORAL | 0 refills | Status: DC | PRN

## 2018-04-16 MED ORDER — SENNA 8.6 MG PO TABS
1.0000 | ORAL_TABLET | Freq: Every day | ORAL | Status: DC
  Administered 2018-04-16 – 2018-04-18 (×3): 8.6 mg via ORAL
  Filled 2018-04-16 (×3): qty 1

## 2018-04-16 MED ORDER — POLYSACCHARIDE IRON COMPLEX 150 MG PO CAPS: 150.0000 mg | ORAL_CAPSULE | Freq: Every day | ORAL | 0 refills | Status: DC

## 2018-04-16 MED ORDER — SODIUM CHLORIDE 0.9 % IV SOLN
INTRAVENOUS | Status: DC
  Administered 2018-04-16: 16:00:00 via INTRAVENOUS

## 2018-04-16 MED ORDER — POLYSACCHARIDE IRON COMPLEX 150 MG PO CAPS
150.0000 mg | ORAL_CAPSULE | Freq: Every day | ORAL | Status: DC
Start: 2018-04-16 — End: 2018-04-20
  Administered 2018-04-16 – 2018-04-20 (×5): 150 mg via ORAL
  Filled 2018-04-16 (×5): qty 1

## 2018-04-16 MED ORDER — BISOPROLOL FUMARATE 10 MG PO TABS: 10.0000 mg | ORAL_TABLET | Freq: Every day | ORAL | 0 refills | Status: DC

## 2018-04-16 NOTE — Discharge Summary (Addendum)
Physician Discharge Summary  Makayla Thomas BJY:[id number] DOB: 15-Mar-1926 DOA: 04/13/2018  PCP: Merri Brunette, MD  Admit date: 04/13/2018 Discharge date: 04/16/2018  Admitted From: Home  Disposition:  SNF  Recommendations for Outpatient Follow-up:  1. Follow up with PCP in 1-2 weeks 2. Please obtain BMP/CBC in one week 3. Needs to follow up with Dr Jena Gauss in 2 week for post op follow up/ for x-rays and suture removal Ortho recommendations:      Weightbearing: NWB RLE.   Insicional and dressing care: Keep splint clean, dry and intact    Discharge Condition: Stable.  CODE STATUS: DNR Diet recommendation: Heart Healthy   Brief/Interim Summary: Makayla Hendersonis a 84 y.o.femalewith medical history significant ofTIA; afib on Xarelto;moderate AS;PAD; hypothyroidism; HTN; MS; HLD; and DM presenting with tibial fracture. She is unable to provide history due to her dementia.    son [name])  reports that she has been receiving all of her medications packaged today through the pharmacy. The family realized recently that the package did not include some of her pills and she had not been taking bisoprolol and another unspecified medication for at least a week or more. The pharmacy provided the packaged medications and 2 "loose pills" starting last week. Her son saw her last week and she was doing fairly well. He thinks this fall might be related to resumption of bisoprolol. In the last week, she has really gone downhill - he thinks it was related to this medication change. As a result, the family called Dr. Elissa Hefty office and he recommended stopping Cardizem instead of Bisoprolol - this has not happened yet. At her 89th birthday party, they also discussed code status - but she would not commit. Her sons decided together that she should be DNR. After her prior hip replacement, post-anesthesia or with pain medication, she had serious hallucinations.  ED Course:Mechanical fall today.  On Xarelto for afib, mild dementia. Fall on left side - periprosthetic femur fracture, left bimalleolar fracture. [place]. Orthopedics consulted, possible ORIF for both this afternoon.   Assessment & Plan:   Principal Problem:   Peri-prosthetic fracture of femur at tip of prosthesis Active Problems:   Permanent atrial fibrillation (HCC); CHA2DS2-VASc Score 6   Chronic anticoagulation   Dyslipidemia, goal LDL below 100 - due aortic stenosis   Essential hypertension -well controlled   Hypothyroidism   Trimalleolar fracture of ankle, closed, left, initial encounter   CKD (chronic kidney disease) stage 3, GFR 30-59 ml/min (HCC)   Hyperglycemia   Dementia  Left Hip and trimalleolar ankle fracture;  PT per ortho.  Post op  8-29. S/P ORIF, left bimalleolar ankle fracture post ORIF.  Pain management.  Schedule miralax for bowel regimen,  Ok to resume xarelto.  Pain management, avoid oversedation. Tylenol for pain.  Ancef for 24 hours.  Patient has not had BM, not eating a lot. Will start IV fluids, and re-evaluate in am to determine if she will be transfer to rehab.   Permanent A fib;  Discussed with Dr Herbie Baltimore, patient ; son concern regarding Cardizem and making patient sleepy and tired. Plan to hold Cardizem for now. Continue with low dose bisoprolol 5 mg daily and if Hr increases increase bisprolol to 10.  -Patient was taking bisoprolol 10 mg at home. HR this am 90---100. W increased bisoprolol to 10 mg daily.  Resume xarelto.  HR controlled. Stop Cartia.  If HR increases could consider extra dose of 5 mg of bisoprolol  Confusion;  Worsening confusion, weakness over last 3 week. Some changes in cognition for last 3 months.  B 12 normal. .  TSH normal.  Vitamin D level. Resume vitamin d supplement.  Needs follow up with neurology when recover from sx. Needs evaluation for dementia.  stable.   Mild to moderate aortic stenosis Monitor./  Out  patient follow up.   HTN; monitor post op.  Hold triamterene for  now.    Acute blood loss anemia; post sx. Expected.  Repeat hb in am./  hb stable at 9.7. Ok to discharge per ortho.  Start nu iron.   Discharge Diagnoses:  Principal Problem:   Peri-prosthetic fracture of femur at tip of prosthesis Active Problems:   Permanent atrial fibrillation (HCC); CHA2DS2-VASc Score 6   Chronic anticoagulation   Dyslipidemia, goal LDL below 100 - due aortic stenosis   Essential hypertension -well controlled   Hypothyroidism   Trimalleolar fracture of ankle, closed, left, initial encounter   CKD (chronic kidney disease) stage 3, GFR 30-59 ml/min (HCC)   Hyperglycemia   Dementia    Discharge Instructions  Discharge Instructions    Diet - low sodium heart healthy   Complete by:  As directed    Increase activity slowly   Complete by:  As directed      Allergies as of 04/16/2018      Reactions   Tetanus Antitoxin Anaphylaxis   Throat swelling   Tramadol Other (See Comments)   HALLUCINATIONS; "and paranoia"   Augmentin [amoxicillin-pot Clavulanate]    UNSPECIFIED REACTION    Codeine    UNSPECIFIED REACTION    Livalo [pitavastatin]    UNSPECIFIED REACTION    Meclizine Other (See Comments)   Had funny feeling      Medication List    STOP taking these medications   CARTIA XT 120 MG 24 hr capsule Generic drug:  diltiazem   solifenacin 5 MG tablet Commonly known as:  VESICARE   triamterene-hydrochlorothiazide 37.5-25 MG tablet Commonly known as:  MAXZIDE-25     TAKE these medications   acetaminophen 325 MG tablet Commonly known as:  TYLENOL Take 2 tablets (650 mg total) by mouth every 6 (six) hours as needed for mild pain or moderate pain.   bisoprolol 10 MG tablet Commonly known as:  ZEBETA Take 1 tablet (10 mg total) by mouth daily. Start taking on:  04/17/2018 What changed:  additional instructions   cholecalciferol 1000 units tablet Commonly known as:  VITAMIN  D Take 1,000 Units by mouth every morning.   Cranberry 500 MG Tabs Take 250 mg by mouth daily.   docusate sodium 100 MG capsule Commonly known as:  COLACE Take 1 capsule (100 mg total) by mouth 2 (two) times daily.   feeding supplement (ENSURE ENLIVE) Liqd Take 237 mLs by mouth 2 (two) times daily between meals.   iron polysaccharides 150 MG capsule Commonly known as:  NIFEREX Take 1 capsule (150 mg total) by mouth daily. Start taking on:  04/17/2018   levothyroxine 125 MCG tablet Commonly known as:  SYNTHROID, LEVOTHROID Take 125 mcg by mouth daily before breakfast.   polyethylene glycol packet Commonly known as:  MIRALAX / GLYCOLAX Take 17 g by mouth daily. Start taking on:  04/17/2018   Rivaroxaban 15 MG Tabs tablet Commonly known as:  XARELTO Take 1 tablet (15 mg total) by mouth daily with breakfast. What changed:  when to take this   trimethoprim 100 MG tablet Commonly known as:  TRIMPEX Take 100  mg by mouth daily.      Follow-up Information    Haddix, Gillie Manners, MD Follow up in 1 week(s).   Specialty:  Orthopedic Surgery Contact information: [address] [place] [address] [phone]          Allergies  Allergen Reactions  . Tetanus Antitoxin Anaphylaxis    Throat swelling  . Tramadol Other (See Comments)    HALLUCINATIONS; "and paranoia"  . Augmentin [Amoxicillin-Pot Clavulanate]     UNSPECIFIED REACTION   . Codeine     UNSPECIFIED REACTION   . Livalo [Pitavastatin]     UNSPECIFIED REACTION   . Meclizine Other (See Comments)    Had funny feeling    Consultations:  Ortho    Procedures/Studies: Dg Chest 1 View  Result Date: 04/13/2018 CLINICAL DATA:  84 year old female with a history of fall and a left leg deformity EXAM: CHEST  1 VIEW COMPARISON:  03/11/2018, 06/27/2017 FINDINGS: Cardiomediastinal silhouette unchanged in size and contour with cardiomegaly. Right rotation somewhat accentuates the heart borders. Calcifications  of the aortic arch. Low lung volumes. Coarsened interstitial markings, similar to prior. No confluent airspace disease, pneumothorax, or pleural effusion. Degenerative changes of the spine IMPRESSION: Chronic lung changes and likely atelectasis with no evidence of acute cardiopulmonary disease. Cardiomegaly Electronically Signed   By: Gilmer Mor D.O.   On: 04/13/2018 07:00   Dg Tibia/fibula Left  Result Date: 04/13/2018 CLINICAL DATA:  84 y/o F; fall this morning with left leg deformity. EXAM: LEFT TIBIA AND FIBULA - 2 VIEW; LEFT FEMUR 2 VIEWS; LEFT FOOT - COMPLETE 3+ VIEW COMPARISON:  01/03/2014 CT of the left knee. FINDINGS: Left femur: Acute comminuted transverse fracture of the lower femoral diaphysis just above the knee prosthesis with 1 shaft's width posterior displacement of the distal fracture component. The hip joint appears well maintained. Left tibia and fibula: Left distal femur fracture as above. Total knee prosthesis without periprosthetic lucency. Chronic fracture deformity of the patella. Vascular calcifications noted. Acute minimally displaced fracture of the medial malleolus. Acute oblique minimally displaced fracture of the lower fibula above level of tibial plafond. No ankle joint dislocation identified on these nonstress views. Possible minimally displaced fracture of the posterior malleolus. Left foot: There is no evidence of fracture or other focal bone lesions the foot. Soft tissues are unremarkable. IMPRESSION: 1. Acute comminuted transverse fracture of the left lower femur diaphysis just above the knee prosthesis. One shaft's width posterior displacement of distal fracture component. 2. Acute minimally displaced fracture of left medial malleolus. 3. Acute minimally displaced fracture of the left lower fibula diaphysis above level of tibial plafond. 4. Possible minimally displaced acute fracture of posterior malleolus. 5. Chronic fracture deformity of the left patella. Electronically  Signed   By: Mitzi Hansen M.D.   On: 04/13/2018 06:46   Dg Ankle 2 Views Left  Result Date: 04/14/2018 CLINICAL DATA:  ORIF Left Ankle EXAM: LEFT ANKLE - 2 VIEW COMPARISON:  Plain films dated 04/13/2018. FINDINGS: Six intraoperative fluoroscopic images are provided. Final images show plate and screw fixation hardware of the distal tibia and fibula. Hardware appears intact and appropriately positioned. Osseous alignment is significantly improved compared to earlier plain films. Fluoroscopy was provided for 73 seconds. IMPRESSION: Intraoperative films demonstrating plate and screw fixation hardware of the distal LEFT tibia and fibula. No evidence of surgical complicating feature. Electronically Signed   By: Bary Richard M.D.   On: 04/14/2018 13:49   Dg Ankle 2 Views Left  Result Date: 04/13/2018 CLINICAL DATA:  Recent fall with left ankle pain and deformity, initial encounter EXAM: LEFT ANKLE - 2 VIEW COMPARISON:  Films from earlier in the same day. FINDINGS: Oblique fracture through the distal fibular metaphysis is noted. Additionally a transverse fracture through the medial malleolus is seen. Slight angulation laterally at the fracture site is noted. Soft tissue swelling is seen. No other fracture is identified. IMPRESSION: Distal tibial and fibular fractures as described. No definitive posterior malleolar fracture is seen. Electronically Signed   By: Alcide Clever M.D.   On: 04/13/2018 08:16   Dg Ankle Complete Left  Result Date: 04/14/2018 CLINICAL DATA:  S/p ORIF left ankle EXAM: LEFT ANKLE COMPLETE - 3+ VIEW COMPARISON:  Plain films of the LEFT ankle dated 04/13/2018. FINDINGS: Interval plate and screw fixation hardware of the distal LEFT tibia and fibula. Hardware appears intact and appropriately positioned. Osseous alignment is significantly improved. Ankle mortise is now symmetric. Overlying casting in place. IMPRESSION: Significantly improved alignment of the distal LEFT tibia and  fibula status post plate and screw fixation. Electronically Signed   By: Bary Richard M.D.   On: 04/14/2018 13:54   Ct Head Wo Contrast  Result Date: 04/13/2018 CLINICAL DATA:  Recent fall with head injury EXAM: CT HEAD WITHOUT CONTRAST CT CERVICAL SPINE WITHOUT CONTRAST TECHNIQUE: Multidetector CT imaging of the head and cervical spine was performed following the standard protocol without intravenous contrast. Multiplanar CT image reconstructions of the cervical spine were also generated. COMPARISON:  03/11/2018 FINDINGS: CT HEAD FINDINGS Brain: Chronic atrophic changes and chronic white matter ischemic changes again noted and stable. No findings to suggest acute hemorrhage, acute infarction space-occupying lesion seen. Vascular: No hyperdense vessel or unexpected calcification. Skull: Normal. Negative for fracture or focal lesion. Sinuses/Orbits: No acute finding. Other: None. CT CERVICAL SPINE FINDINGS Alignment: Within normal limits. Skull base and vertebrae: 7 cervical segments are well visualized. Vertebral body height is well maintained. Multilevel disc space narrowing is noted from C3-C7. Mild osteophytic changes and facet hypertrophic changes are noted. No findings to suggest acute fracture or facet abnormality seen. The odontoid is within normal limits. Multilevel neural foraminal narrowing is noted bilaterally. Soft tissues and spinal canal: Diffuse vascular calcifications are noted. No acute soft tissue abnormality is noted. Upper chest: Within normal limits Other: None IMPRESSION: CT of the head: Chronic atrophic and ischemic changes without acute abnormality. CT of the cervical spine: Multilevel degenerative change without acute abnormality. Electronically Signed   By: Alcide Clever M.D.   On: 04/13/2018 07:58   Ct Cervical Spine Wo Contrast  Result Date: 04/13/2018 CLINICAL DATA:  Recent fall with head injury EXAM: CT HEAD WITHOUT CONTRAST CT CERVICAL SPINE WITHOUT CONTRAST TECHNIQUE:  Multidetector CT imaging of the head and cervical spine was performed following the standard protocol without intravenous contrast. Multiplanar CT image reconstructions of the cervical spine were also generated. COMPARISON:  03/11/2018 FINDINGS: CT HEAD FINDINGS Brain: Chronic atrophic changes and chronic white matter ischemic changes again noted and stable. No findings to suggest acute hemorrhage, acute infarction space-occupying lesion seen. Vascular: No hyperdense vessel or unexpected calcification. Skull: Normal. Negative for fracture or focal lesion. Sinuses/Orbits: No acute finding. Other: None. CT CERVICAL SPINE FINDINGS Alignment: Within normal limits. Skull base and vertebrae: 7 cervical segments are well visualized. Vertebral body height is well maintained. Multilevel disc space narrowing is noted from C3-C7. Mild osteophytic changes and facet hypertrophic changes are noted. No findings to suggest acute fracture or facet abnormality seen.  The odontoid is within normal limits. Multilevel neural foraminal narrowing is noted bilaterally. Soft tissues and spinal canal: Diffuse vascular calcifications are noted. No acute soft tissue abnormality is noted. Upper chest: Within normal limits Other: None IMPRESSION: CT of the head: Chronic atrophic and ischemic changes without acute abnormality. CT of the cervical spine: Multilevel degenerative change without acute abnormality. Electronically Signed   By: Alcide Clever M.D.   On: 04/13/2018 07:58   Dg Foot Complete Left  Result Date: 04/13/2018 CLINICAL DATA:  84 y/o F; fall this morning with left leg deformity. EXAM: LEFT TIBIA AND FIBULA - 2 VIEW; LEFT FEMUR 2 VIEWS; LEFT FOOT - COMPLETE 3+ VIEW COMPARISON:  01/03/2014 CT of the left knee. FINDINGS: Left femur: Acute comminuted transverse fracture of the lower femoral diaphysis just above the knee prosthesis with 1 shaft's width posterior displacement of the distal fracture component. The hip joint appears well  maintained. Left tibia and fibula: Left distal femur fracture as above. Total knee prosthesis without periprosthetic lucency. Chronic fracture deformity of the patella. Vascular calcifications noted. Acute minimally displaced fracture of the medial malleolus. Acute oblique minimally displaced fracture of the lower fibula above level of tibial plafond. No ankle joint dislocation identified on these nonstress views. Possible minimally displaced fracture of the posterior malleolus. Left foot: There is no evidence of fracture or other focal bone lesions the foot. Soft tissues are unremarkable. IMPRESSION: 1. Acute comminuted transverse fracture of the left lower femur diaphysis just above the knee prosthesis. One shaft's width posterior displacement of distal fracture component. 2. Acute minimally displaced fracture of left medial malleolus. 3. Acute minimally displaced fracture of the left lower fibula diaphysis above level of tibial plafond. 4. Possible minimally displaced acute fracture of posterior malleolus. 5. Chronic fracture deformity of the left patella. Electronically Signed   By: Mitzi Hansen M.D.   On: 04/13/2018 06:46   Dg C-arm 1-60 Min  Result Date: 04/14/2018 CLINICAL DATA:  ORIF DISTAL LEFT FEMUR EXAM: DG C-ARM 61-120 MIN COMPARISON:  None. FINDINGS: Intraoperative fluoroscopic images demonstrating plate and screw fixation hardware of the LEFT femur fracture. Final images demonstrated significantly improved osseous alignment. Fluoroscopy was provided for 87 seconds. IMPRESSION: Intraoperative fluoroscopic images demonstrating plate and screw fixation hardware of the LEFT femur. Fluoroscopy provided for the procedure. Electronically Signed   By: Bary Richard M.D.   On: 04/14/2018 13:53   Dg C-arm 1-60 Min  Result Date: 04/14/2018 CLINICAL DATA:  ORIF Left Ankle EXAM: DG C-ARM 61-120 MIN COMPARISON:  Plain films of the LEFT ankle dated 04/13/2018. FINDINGS: Intraoperative fluoroscopic  images demonstrating plate and screw fixation hardware of the distal LEFT tibia and fibula. Hardware appears intact and appropriately positioned. Osseous alignment is improved compared to earlier plain films. Fluoroscopy was provided for 73 seconds. IMPRESSION: Intraoperative images demonstrating plate and screw fixation hardware of the distal LEFT tibia and fibula. Fluoroscopy provided for the procedure. Electronically Signed   By: Bary Richard M.D.   On: 04/14/2018 13:50   Dg Femur Min 2 Views Left  Result Date: 04/14/2018 CLINICAL DATA:  ORIF DISTAL LEFT FEMUR EXAM: LEFT FEMUR 2 VIEWS COMPARISON:  Plain films of the femur dated 04/13/2018. FINDINGS: Six intraoperative fluoroscopic images are provided of the LEFT femur. Final images demonstrate plate and screw fixation of the LEFT femur, traversing the displaced/comminuted fracture within the distal LEFT femur. Hardware appears intact and appropriately positioned. Fluoroscopy was provided for 87 seconds. IMPRESSION: Intraoperative images demonstrating plate and screw fixation of  the LEFT femur. Hardware appears intact and appropriately positioned. No evidence of surgical complicating feature. Electronically Signed   By: Bary Richard M.D.   On: 04/14/2018 13:52   Dg Femur Min 2 Views Left  Result Date: 04/13/2018 CLINICAL DATA:  84 y/o F; fall this morning with left leg deformity. EXAM: LEFT TIBIA AND FIBULA - 2 VIEW; LEFT FEMUR 2 VIEWS; LEFT FOOT - COMPLETE 3+ VIEW COMPARISON:  01/03/2014 CT of the left knee. FINDINGS: Left femur: Acute comminuted transverse fracture of the lower femoral diaphysis just above the knee prosthesis with 1 shaft's width posterior displacement of the distal fracture component. The hip joint appears well maintained. Left tibia and fibula: Left distal femur fracture as above. Total knee prosthesis without periprosthetic lucency. Chronic fracture deformity of the patella. Vascular calcifications noted. Acute minimally displaced  fracture of the medial malleolus. Acute oblique minimally displaced fracture of the lower fibula above level of tibial plafond. No ankle joint dislocation identified on these nonstress views. Possible minimally displaced fracture of the posterior malleolus. Left foot: There is no evidence of fracture or other focal bone lesions the foot. Soft tissues are unremarkable. IMPRESSION: 1. Acute comminuted transverse fracture of the left lower femur diaphysis just above the knee prosthesis. One shaft's width posterior displacement of distal fracture component. 2. Acute minimally displaced fracture of left medial malleolus. 3. Acute minimally displaced fracture of the left lower fibula diaphysis above level of tibial plafond. 4. Possible minimally displaced acute fracture of posterior malleolus. 5. Chronic fracture deformity of the left patella. Electronically Signed   By: Mitzi Hansen M.D.   On: 04/13/2018 06:46   Dg Femur Port Min 2 Views Left  Result Date: 04/14/2018 CLINICAL DATA:  S/P ORIF left femur EXAM: LEFT FEMUR PORTABLE 2 VIEWS COMPARISON:  Plain films dated 04/13/2018. FINDINGS: Status post plate and screw fixation of the LEFT femur, traversing the displaced/comminuted fracture within the distal LEFT femur. Hardware appears intact and appropriately positioned. Osseous alignment is significantly improved. No evidence of surgical complicating feature. IMPRESSION: Status post plate and screw fixation of the LEFT femur. Significantly improved osseous alignment. No evidence of surgical complicating feature. Electronically Signed   By: Bary Richard M.D.   On: 04/14/2018 13:56   Dg Hips Bilat W Or Wo Pelvis 3-4 Views  Result Date: 04/13/2018 CLINICAL DATA:  Recent fall with bilateral hip pain left greater than right EXAM: DG HIP (WITH OR WITHOUT PELVIS) 3-4V BILAT COMPARISON:  03/11/2018 FINDINGS: Prior right hip replacement is again identified and stable. The pelvic ring is intact. No acute  fracture or dislocation is noted. No soft tissue changes are seen. IMPRESSION: No acute abnormality noted. Electronically Signed   By: Alcide Clever M.D.   On: 04/13/2018 08:14     Subjective: Alert. Report she was able to sleep well, last night.  Denies chest pain or dyspnea.  No abdominal pain   Discharge Exam: Vitals:   04/15/18 2022 04/16/18 0444  BP: (!) 98/56 107/73  Pulse: 71 87  Resp:    Temp:  98.6 F (37 C)  SpO2: 95% 94%   Vitals:   04/15/18 0853 04/15/18 1347 04/15/18 2022 04/16/18 0444  BP: 121/64 101/60 (!) 98/56 107/73  Pulse: 94 97 71 87  Resp:      Temp: 98 F (36.7 C) 98.9 F (37.2 C)  98.6 F (37 C)  TempSrc: Oral Oral  Oral  SpO2: 99% 94% 95% 94%  Weight:      Height:  General: Pt is alert, awake, not in acute distress Cardiovascular: RRR, S1/S2 +, no rubs, no gallops Respiratory: CTA bilaterally, no wheezing, no rhonchi Abdominal: Soft, NT, ND, bowel sounds + Extremities: no edema, no cyanosis    The results of significant diagnostics from this hospitalization (including imaging, microbiology, ancillary and laboratory) are listed below for reference.     Microbiology: Recent Results (from the past 240 hour(s))  MRSA PCR Screening     Status: None   Collection Time: 04/13/18  6:34 PM  Result Value Ref Range Status   MRSA by PCR NEGATIVE NEGATIVE Final    Comment:        The GeneXpert MRSA Assay (FDA approved for NASAL specimens only), is one component of a comprehensive MRSA colonization surveillance program. It is not intended to diagnose MRSA infection nor to guide or monitor treatment for MRSA infections. Performed at [facility], [address], [place], [place] [address]      Labs: BNP (last 3 results) No results for input(s): BNP in the last 8760 hours. Basic Metabolic Panel: Recent Labs  Lab 04/13/18 0613 04/15/18 0447  NA 140 140  K 4.1 3.9  CL 105 105  CO2 27 28  GLUCOSE 147* 142*  BUN 20 16   CREATININE 1.41* 1.00  CALCIUM 9.3 8.7*   Liver Function Tests: No results for input(s): AST, ALT, ALKPHOS, BILITOT, PROT, ALBUMIN in the last 168 hours. No results for input(s): LIPASE, AMYLASE in the last 168 hours. No results for input(s): AMMONIA in the last 168 hours. CBC: Recent Labs  Lab 04/13/18 0613 04/14/18 1447 04/15/18 0447 04/16/18 0526  WBC 8.6 14.1* 12.1* 9.9  NEUTROABS 6.5  --   --   --   HGB 13.9 12.5 10.7* 9.6*  HCT 43.0 38.3 33.1* 30.0*  MCV 96.2 94.6 94.3 96.5  PLT 247 249 225 204   Cardiac Enzymes: No results for input(s): CKTOTAL, CKMB, CKMBINDEX, TROPONINI in the last 168 hours. BNP: Invalid input(s): POCBNP CBG: Recent Labs  Lab 04/14/18 1015  GLUCAP 149*   D-Dimer No results for input(s): DDIMER in the last 72 hours. Hgb A1c No results for input(s): HGBA1C in the last 72 hours. Lipid Profile Recent Labs    04/13/18 1107  CHOL 212*  HDL 47  LDLCALC 143*  TRIG 110  CHOLHDL 4.5   Thyroid function studies Recent Labs    04/13/18 1107  TSH 1.005   Anemia work up Recent Labs    04/15/18 0447  VITAMINB12 687   Urinalysis    Component Value Date/Time   COLORURINE YELLOW 07/28/2016 1828   APPEARANCEUR CLEAR 07/28/2016 1828   LABSPEC 1.014 07/28/2016 1828   PHURINE 6.0 07/28/2016 1828   GLUCOSEU NEGATIVE 07/28/2016 1828   HGBUR NEGATIVE 07/28/2016 1828   BILIRUBINUR NEGATIVE 07/28/2016 1828   KETONESUR NEGATIVE 07/28/2016 1828   PROTEINUR NEGATIVE 07/28/2016 1828   UROBILINOGEN 1.0 02/05/2015 0344   NITRITE NEGATIVE 07/28/2016 1828   LEUKOCYTESUR NEGATIVE 07/28/2016 1828   Sepsis Labs Invalid input(s): PROCALCITONIN,  WBC,  LACTICIDVEN Microbiology Recent Results (from the past 240 hour(s))  MRSA PCR Screening     Status: None   Collection Time: 04/13/18  6:34 PM  Result Value Ref Range Status   MRSA by PCR NEGATIVE NEGATIVE Final    Comment:        The GeneXpert MRSA Assay (FDA approved for NASAL specimens only), is  one component of a comprehensive MRSA colonization surveillance program. It is not  intended to diagnose MRSA infection nor to guide or monitor treatment for MRSA infections. Performed at [facility], [address], [place], [place] [address]      Time coordinating discharge: 35 minutes.   SIGNED:   Alba Cory, MD  [facility] 04/16/2018, 9:24 AM Pager [phone]  If 7PM-7AM, please contact night-coverage [email or web address] Password [email or web address]

## 2018-04-16 NOTE — Clinical Social Work Note (Signed)
Clinical Social Worker facilitated patient discharge including contacting patient family and facility to confirm patient discharge plans.  Clinical information faxed to facility and family agreeable with plan.  CSW arranged ambulance transport via [facility] to [place] (room 7010A) .  RN to call [phone] for report prior to discharge.  Clinical Social Worker will sign off for now as social work intervention is no longer needed. Please consult [place] again if new need arises.  [place], [place] [phone]

## 2018-04-16 NOTE — Clinical Social Work Placement (Signed)
   CLINICAL SOCIAL WORK PLACEMENT  NOTE  Date:  04/16/2018  Patient Details  Name: Makayla Thomas MRN: [id number] Date of Birth: 05-13-1926  Clinical Social Work is seeking post-discharge placement for this patient at the Skilled  Nursing Facility level of care (*CSW will initial, date and re-position this form in  chart as items are completed):      Patient/family provided with [facility] Clinical Social Work Department's list of facilities offering this level of care within the geographic area requested by the patient (or if unable, by the patient's family).  Yes   Patient/family informed of their freedom to choose among providers that offer the needed level of care, that participate in Medicare, Medicaid or managed care program needed by the patient, have an available bed and are willing to accept the patient.      Patient/family informed of [facility]'s ownership interest in [facility] and [facility], as well as of the fact that they are under no obligation to receive care at these facilities.  PASRR submitted to EDS on       PASRR number received on 04/15/18     Existing PASRR number confirmed on       FL2 transmitted to all facilities in geographic area requested by pt/family on 04/15/18     FL2 transmitted to all facilities within larger geographic area on       Patient informed that his/her managed care company has contracts with or will negotiate with certain facilities, including the following:        Yes   Patient/family informed of bed offers received.  Patient chooses bed at [facility]     Physician recommends and patient chooses bed at      Patient to be transferred to [facility] [place] on 04/16/18.  Patient to be transferred to facility by [facility]     Patient family notified on 04/16/18 of transfer.  Name of family member notified:  Brett Canales, son     PHYSICIAN       Additional Comment:     _______________________________________________ Maree Krabbe, LCSW 04/16/2018, 11:42 AM

## 2018-04-16 NOTE — Progress Notes (Signed)
Patient ID: Nina Peikert, female   DOB: [birth year], 84 y.o.   MRN: [id number]     Subjective:  Patient reports pain as mild to moderate.  Patient in bed and in no acute distress.  Denies any CP or SOB  Objective:   VITALS:   Vitals:   04/15/18 0853 04/15/18 1347 04/15/18 2022 04/16/18 0444  BP: 121/64 101/60 (!) 98/56 107/73  Pulse: 94 97 71 87  Resp:      Temp: 98 F (36.7 C) 98.9 F (37.2 C)  98.6 F (37 C)  TempSrc: Oral Oral  Oral  SpO2: 99% 94% 95% 94%  Weight:      Height:        ABD soft Sensation intact distally Dorsiflexion/Plantar flexion intact Incision: dressing C/D/I and no drainage   Lab Results  Component Value Date   WBC 9.9 04/16/2018   HGB 9.6 (L) 04/16/2018   HCT 30.0 (L) 04/16/2018   MCV 96.5 04/16/2018   PLT 204 04/16/2018   BMET    Component Value Date/Time   NA 140 04/15/2018 0447   K 3.9 04/15/2018 0447   CL 105 04/15/2018 0447   CO2 28 04/15/2018 0447   GLUCOSE 142 (H) 04/15/2018 0447   BUN 16 04/15/2018 0447   CREATININE 1.00 04/15/2018 0447   CALCIUM 8.7 (L) 04/15/2018 0447   GFRNONAA 48 (L) 04/15/2018 0447   GFRAA 56 (L) 04/15/2018 0447     Assessment/Plan: 2 Days Post-Op   Principal Problem:   Peri-prosthetic fracture of femur at tip of prosthesis Active Problems:   Permanent atrial fibrillation (HCC); CHA2DS2-VASc Score 6   Chronic anticoagulation   Dyslipidemia, goal LDL below 100 - due aortic stenosis   Essential hypertension -well controlled   Hypothyroidism   Trimalleolar fracture of ankle, closed, left, initial encounter   CKD (chronic kidney disease) stage 3, GFR 30-59 ml/min (HCC)   Hyperglycemia   Dementia   Advance diet Up with therapy Continue plan per medicine Okay for SNF Return to see Dr Jena Gauss in 1-2 weeks      Haskel Khan 04/16/2018, 10:07 AM   Teryl Lucy, MD Cell [phone]

## 2018-04-16 NOTE — Plan of Care (Signed)

## 2018-04-16 NOTE — Progress Notes (Signed)
Call MD, patient has not voided or had a BM. Bladder Scan revealed <200cc of urine. Bladder non distended. Patient has poor po intake. Orders to start IV fluids. Family in room and notified.

## 2018-04-17 LAB — TYPE AND SCREEN
ABO/RH(D): O POS
ANTIBODY SCREEN: NEGATIVE
UNIT DIVISION: 0
Unit division: 0

## 2018-04-17 LAB — BPAM RBC
Blood Product Expiration Date: 201909172359
Blood Product Expiration Date: 201909202359
ISSUE DATE / TIME: 201908261037
ISSUE DATE / TIME: 201908261037
Unit Type and Rh: 5100
Unit Type and Rh: 5100

## 2018-04-17 LAB — BASIC METABOLIC PANEL
Anion gap: 6 (ref 5–15)
BUN: 23 mg/dL (ref 8–23)
CHLORIDE: 108 mmol/L (ref 98–111)
CO2: 28 mmol/L (ref 22–32)
Calcium: 8 mg/dL — ABNORMAL LOW (ref 8.9–10.3)
Creatinine, Ser: 0.92 mg/dL (ref 0.44–1.00)
GFR calc Af Amer: >60 mL/min (ref >60)
GFR calc non Af Amer: 53 mL/min — ABNORMAL LOW (ref >60)
GLUCOSE: 113 mg/dL — AB (ref 70–99)
POTASSIUM: 4.1 mmol/L (ref 3.5–5.1)
Sodium: 142 mmol/L (ref 135–145)

## 2018-04-17 LAB — MAGNESIUM: Magnesium: 2.1 mg/dL (ref 1.7–2.4)

## 2018-04-17 LAB — HEMOGLOBIN AND HEMATOCRIT, BLOOD
HCT: 29.9 % — ABNORMAL LOW (ref 36.0–46.0)
Hemoglobin: 9.4 g/dL — ABNORMAL LOW (ref 12.0–15.0)

## 2018-04-17 MED ORDER — ENSURE ENLIVE PO LIQD
237.0000 mL | Freq: Three times a day (TID) | ORAL | Status: DC
  Administered 2018-04-17 – 2018-04-20 (×10): 237 mL via ORAL

## 2018-04-17 NOTE — Progress Notes (Signed)
Patient has not voided since last In &Out. Bladder scan <250cc. MD notified. Order to increase IV fluids. Patient's appetite has improved today. Eating more than 25% of meals. Encouraged to increase po fluids. Will cont to monitor.

## 2018-04-17 NOTE — Plan of Care (Signed)
  Problem: Education: Goal: Knowledge of General Education information will improve Description Including pain rating scale, medication(s)/side effects and non-pharmacologic comfort measures Outcome: Progressing   Problem: Clinical Measurements: Goal: Ability to maintain clinical measurements within normal limits will improve Outcome: Progressing   Problem: Elimination: Goal: Will not experience complications related to bowel motility Outcome: Progressing   Problem: Elimination: Goal: Will not experience complications related to urinary retention Outcome: Progressing   Problem: Pain Managment: Goal: General experience of comfort will improve Outcome: Progressing   Problem: Safety: Goal: Ability to remain free from injury will improve Outcome: Progressing   Problem: Skin Integrity: Goal: Risk for impaired skin integrity will decrease Outcome: Progressing

## 2018-04-17 NOTE — Progress Notes (Signed)
PROGRESS NOTE    Makayla Thomas  WRU:[id number] DOB: 03-05-1926 DOA: 04/13/2018 PCP: Merri Brunette, MD    Brief Narrative:: Makayla Thomas is a 84 y.o. female with medical history significant of TIA; afib on Xarelto; moderate AS; PAD; hypothyroidism; HTN;  MS; HLD; and DM presenting with tibial fracture.  She is unable to provide history due to her dementia.     son [name])  reports that she has been receiving all of her medications packaged today through the pharmacy.  The family realized recently that the package did not include some of her pills and she had not been taking bisoprolol and another unspecified medication for at least a week or more.  The pharmacy provided the packaged medications and 2 "loose pills" starting last week.   Her son saw her last week and she was doing fairly well.  He thinks this fall might be related to resumption of bisoprolol.  In the last week, she has really gone downhill - he thinks it was related to this medication change.  As a result, the family called Dr. Elissa Hefty office and he recommended stopping Cardizem instead of Bisoprolol - this has not happened yet.  At her 89th birthday party, they also discussed code status - but she would not commit.  Her sons decided together that she should be DNR.  After her prior hip replacement, post-anesthesia or with pain medication, she had serious hallucinations.  ED Course:   Mechanical fall today.  On Xarelto for afib, mild dementia.  Fall on left side - periprosthetic femur fracture, left bimalleolar fracture.  [place].  Orthopedics consulted, possible ORIF for both this afternoon.   Assessment & Plan:   Principal Problem:   Peri-prosthetic fracture of femur at tip of prosthesis Active Problems:   Permanent atrial fibrillation (HCC); CHA2DS2-VASc Score 6   Chronic anticoagulation   Dyslipidemia, goal LDL below 100 - due aortic stenosis   Essential hypertension -well controlled  Hypothyroidism   Trimalleolar fracture of ankle, closed, left, initial encounter   CKD (chronic kidney disease) stage 3, GFR 30-59 ml/min (HCC)   Hyperglycemia   Dementia  Left Hip and trimalleolar ankle fracture;  PT per ortho.  Post op  8-29. S/P ORIF, left bimalleolar ankle fracture post ORIF.  Pain management.  Schedule miralax for bowel regimen,  Ok to resume xarelto.  Pain management, avoid oversedation.  Ancef for 24 hours.  Had small BM. Develops urine retention yesterday.   Permanent A fib;  Discussed with Dr Herbie Baltimore, patient ; son concern regarding Cardizem and making patient sleepy and tired. Plan to hold Cardizem for now. Continue with low dose bisoprolol 5 mg daily and if Hr increases increase bisprolol to 10.  -Patient was taking bisoprolol 10 mg at home. HR this am 90---100. Will increase bisoprolol to 10 mg daily.  Resume xarelto.    Confusion;  Worsening confusion, weakness over last 3 week. Some changes in cognition for last 3 months.  B 12 normal. .  TSH normal.   Vitamin D level.   Mild to moderate aortic stenosis Monitor./  Out patient follow up.   HTN; monitor post op.  Hold triamterene for  now.    Acute blood loss anemia; post sx. Expected.  Hb stable at 9/  Urine retention, monitor , might need foley catheter placement    DVT prophylaxis: resume xarelto.  Code Status: DNR Family Communication: ( son at bedside.  Disposition Plan: she will need rehab.  Consultants:   Ortho  Cardiology    Procedures:      Antimicrobials:   prophylaxis    Subjective: Alert. Not eating enough.  Had very small BM yesterday   Objective: Vitals:   04/16/18 0444 04/16/18 1449 04/16/18 2117 04/17/18 0358  BP: 107/73 114/65 110/82 105/80  Pulse: 87 96 70 63  Resp:  12    Temp: 98.6 F (37 C)  98.9 F (37.2 C) 98.5 F (36.9 C)  TempSrc: Oral  Oral Axillary  SpO2: 94% 95% 100%   Weight:      Height:        Intake/Output Summary (Last 24  hours) at 04/17/2018 4098 Last data filed at 04/17/2018 0600 Gross per 24 hour  Intake 1130.88 ml  Output 720 ml  Net 410.88 ml   Filed Weights   04/13/18 0545  Weight: 75 kg    Examination:  General exam: NAD Respiratory system; CTA Cardiovascular system: S 1, S 2 RRR Gastrointestinal system: BS , present, soft, nt Central nervous system: alert, follows command Extremities; left LE with dressing.  Skin: No rashes, lesions or ulcers   Data Reviewed: I have personally reviewed following labs and imaging studies  CBC: Recent Labs  Lab 04/13/18 0613 04/14/18 1447 04/15/18 0447 04/16/18 0526 04/17/18 0805  WBC 8.6 14.1* 12.1* 9.9  --   NEUTROABS 6.5  --   --   --   --   HGB 13.9 12.5 10.7* 9.6* 9.4*  HCT 43.0 38.3 33.1* 30.0* 29.9*  MCV 96.2 94.6 94.3 96.5  --   PLT 247 249 225 204  --    Basic Metabolic Panel: Recent Labs  Lab 04/13/18 0613 04/15/18 0447  NA 140 140  K 4.1 3.9  CL 105 105  CO2 27 28  GLUCOSE 147* 142*  BUN 20 16  CREATININE 1.41* 1.00  CALCIUM 9.3 8.7*   GFR: Estimated Creatinine Clearance: 35.5 mL/min (by C-G formula based on SCr of 1 mg/dL). Liver Function Tests: No results for input(s): AST, ALT, ALKPHOS, BILITOT, PROT, ALBUMIN in the last 168 hours. No results for input(s): LIPASE, AMYLASE in the last 168 hours. No results for input(s): AMMONIA in the last 168 hours. Coagulation Profile: Recent Labs  Lab 04/13/18 0632  INR 2.14   Cardiac Enzymes: No results for input(s): CKTOTAL, CKMB, CKMBINDEX, TROPONINI in the last 168 hours. BNP (last 3 results) No results for input(s): PROBNP in the last 8760 hours. HbA1C: No results for input(s): HGBA1C in the last 72 hours. CBG: Recent Labs  Lab 04/14/18 1015  GLUCAP 149*   Lipid Profile: No results for input(s): CHOL, HDL, LDLCALC, TRIG, CHOLHDL, LDLDIRECT in the last 72 hours. Thyroid Function Tests: No results for input(s): TSH, T4TOTAL, FREET4, T3FREE, THYROIDAB in the last 72  hours. Anemia Panel: Recent Labs    04/15/18 0447  VITAMINB12 687   Sepsis Labs: No results for input(s): PROCALCITON, LATICACIDVEN in the last 168 hours.  Recent Results (from the past 240 hour(s))  MRSA PCR Screening     Status: None   Collection Time: 04/13/18  6:34 PM  Result Value Ref Range Status   MRSA by PCR NEGATIVE NEGATIVE Final    Comment:        The GeneXpert MRSA Assay (FDA approved for NASAL specimens only), is one component of a comprehensive MRSA colonization surveillance program. It is not intended to diagnose MRSA infection nor to guide or monitor treatment for MRSA infections. Performed at [facility],  [address], [place], [place] [address]          Radiology Studies: No results found.      Scheduled Meds: . bisoprolol  10 mg Oral Daily  . cholecalciferol  1,000 Units Oral q morning - 10a  . docusate sodium  100 mg Oral BID  . feeding supplement (ENSURE ENLIVE)  237 mL Oral BID BM  . iron polysaccharides  150 mg Oral Daily  . levothyroxine  125 mcg Oral QAC breakfast  . multivitamin with minerals  1 tablet Oral Daily  . polyethylene glycol  17 g Oral Daily  . rivaroxaban  15 mg Oral Q supper  . senna  1 tablet Oral Daily  . trimethoprim  100 mg Oral Daily   Continuous Infusions: . sodium chloride 50 mL/hr at 04/17/18 0400  . methocarbamol (ROBAXIN) IV Stopped (04/15/18 0026)     LOS: 4 days    Time spent: 35 minutes.     Alba Cory, MD [facility] Pager [phone]  If 7PM-7AM, please contact night-coverage [email or web address] Password [email or web address] 04/17/2018, 9:52 AM

## 2018-04-17 NOTE — Progress Notes (Signed)
In and out cath performed due to bladder scan >600 cc. Order obtained  From nurse practitioner on call.  720 cc clear dark amber urine obtained.  She has been unable to void since foley removed.  Mentation much clearer this morning and she states that she "needs to urinate but can't make it come out".

## 2018-04-17 NOTE — Clinical Social Work Note (Signed)
CSW continuing to follow for medical stability.   Velora Mediate, MSW [phone]

## 2018-04-17 NOTE — Progress Notes (Signed)
Patient stated that she has voided. A large wet circle noted on bed pad.  Bladder scan revealed  <300cc. MD notified and state to repeat bladder scan in 2-3 hours. Family at bed side and updated.

## 2018-04-18 LAB — CBC
HEMATOCRIT: 33.4 % — AB (ref 36.0–46.0)
HEMOGLOBIN: 10.4 g/dL — AB (ref 12.0–15.0)
MCH: 30.6 pg (ref 26.0–34.0)
MCHC: 31.1 g/dL (ref 30.0–36.0)
MCV: 98.2 fL (ref 78.0–100.0)
Platelets: 274 10*3/uL (ref 150–400)
RBC: 3.4 MIL/uL — ABNORMAL LOW (ref 3.87–5.11)
RDW: 13.6 % (ref 11.5–15.5)
WBC: 8 10*3/uL (ref 4.0–10.5)

## 2018-04-18 LAB — AMMONIA: Ammonia: 16 umol/L (ref 9–35)

## 2018-04-18 MED ORDER — TAMSULOSIN HCL 0.4 MG PO CAPS
0.4000 mg | ORAL_CAPSULE | Freq: Every day | ORAL | Status: DC
  Administered 2018-04-18 – 2018-04-20 (×3): 0.4 mg via ORAL
  Filled 2018-04-18 (×3): qty 1

## 2018-04-18 MED ORDER — FAMOTIDINE 20 MG PO TABS
20.0000 mg | ORAL_TABLET | Freq: Every day | ORAL | Status: DC
  Administered 2018-04-18 – 2018-04-20 (×3): 20 mg via ORAL
  Filled 2018-04-18 (×3): qty 1

## 2018-04-18 MED ORDER — SENNA 8.6 MG PO TABS
1.0000 | ORAL_TABLET | Freq: Two times a day (BID) | ORAL | Status: DC
  Administered 2018-04-18 – 2018-04-20 (×4): 8.6 mg via ORAL
  Filled 2018-04-18 (×4): qty 1

## 2018-04-18 MED ORDER — POLYETHYLENE GLYCOL 3350 17 G PO PACK
17.0000 g | PACK | Freq: Two times a day (BID) | ORAL | Status: DC
  Administered 2018-04-18 – 2018-04-20 (×5): 17 g via ORAL
  Filled 2018-04-18 (×5): qty 1

## 2018-04-18 MED ORDER — BISACODYL 5 MG PO TBEC
5.0000 mg | DELAYED_RELEASE_TABLET | Freq: Once | ORAL | Status: AC
  Administered 2018-04-18: 5 mg via ORAL
  Filled 2018-04-18: qty 1

## 2018-04-18 NOTE — Care Management Important Message (Signed)
Important Message  Patient Details  Name: Makayla Thomas MRN: [id number] Date of Birth: 01-Jul-1926   Medicare Important Message Given:  Yes  Patient could not sign due to illness  Dorena Bodo 04/18/2018, 8:22 AM

## 2018-04-18 NOTE — Care Management Important Message (Signed)
Important Message  Patient Details  Name: Makayla Thomas MRN: [id number] Date of Birth: 1926-05-21   Medicare Important Message Given:  No    Riku Buttery 04/18/2018, 8:23 AM

## 2018-04-18 NOTE — Progress Notes (Signed)
Pt unable to void. Bladder scan and in and out ordered and performed twice overnight with a total of 650 ml output of clear dark amber urine. Pt tolerated well. Will continue to monitor.

## 2018-04-18 NOTE — Plan of Care (Signed)
  Problem: Nutrition: Goal: Adequate nutrition will be maintained Outcome: Progressing   Problem: Elimination: Goal: Will not experience complications related to bowel motility Outcome: Progressing Goal: Will not experience complications related to urinary retention Outcome: Not Progressing Intermittent bladder scan and straight cath to empty pt's bladder   Problem: Pain Managment: Goal: General experience of comfort will improve Outcome: Progressing   Problem: Safety: Goal: Ability to remain free from injury will improve Outcome: Progressing

## 2018-04-18 NOTE — Progress Notes (Signed)
PROGRESS NOTE    Makayla Thomas  WUJ:[id number] DOB: August 21, 1925 DOA: 04/13/2018 PCP: Merri Brunette, MD    Brief Narrative:: Makayla Thomas is a 84 y.o. female with medical history significant of TIA; afib on Xarelto; moderate AS; PAD; hypothyroidism; HTN;  MS; HLD; and DM presenting with tibial fracture.  She is unable to provide history due to her dementia.     son [name])  reports that she has been receiving all of her medications packaged today through the pharmacy.  The family realized recently that the package did not include some of her pills and she had not been taking bisoprolol and another unspecified medication for at least a week or more.  The pharmacy provided the packaged medications and 2 "loose pills" starting last week.   Her son saw her last week and she was doing fairly well.  He thinks this fall might be related to resumption of bisoprolol.  In the last week, she has really gone downhill - he thinks it was related to this medication change.  As a result, the family called Dr. Elissa Hefty office and he recommended stopping Cardizem instead of Bisoprolol - this has not happened yet.  At her 89th birthday party, they also discussed code status - but she would not commit.  Her sons decided together that she should be DNR.  After her prior hip replacement, post-anesthesia or with pain medication, she had serious hallucinations.  ED Course:   Mechanical fall today.  On Xarelto for afib, mild dementia.  Fall on left side - periprosthetic femur fracture, left bimalleolar fracture.  [place] from out of state.  Orthopedics consulted, possible ORIF for both this afternoon.   Assessment & Plan:   Principal Problem:   Peri-prosthetic fracture of femur at tip of prosthesis Active Problems:   Permanent atrial fibrillation (HCC); CHA2DS2-VASc Score 6   Chronic anticoagulation   Dyslipidemia, goal LDL below 100 - due aortic stenosis   Essential hypertension -well controlled  Hypothyroidism   Trimalleolar fracture of ankle, closed, left, initial encounter   CKD (chronic kidney disease) stage 3, GFR 30-59 ml/min (HCC)   Hyperglycemia   Dementia  Left Hip and trimalleolar ankle fracture;  PT per ortho.  Post op  8-29. S/P ORIF, left bimalleolar ankle fracture post ORIF.  Schedule miralax for bowel regimen,  Ok to resume xarelto.  Pain management, avoid oversedation.  Ancef for 24 hours.  Had small BM. Required I and Out cath overnight. Now with foley catheter. [name]   Permanent A fib;  Discussed with Dr Herbie Baltimore, patient ; son concern regarding Cardizem and making patient sleepy and tired. Plan to hold Cardizem for now. Continue with low dose bisoprolol 5 mg daily and if Hr increases increase bisprolol to 10.  -Patient was taking bisoprolol 10 mg at home. HR this am 90---100. Will increase bisoprolol to 10 mg daily.  -On  xarelto.    Confusion; Delirium:  Worsening confusion, weakness over last 3 week. Some changes in cognition for last 3 months.  B 12 normal. .  TSH normal.  Vitamin D level.  Per son , patient is more confuse. Will get MRI, CBC rule out infection.   Mild to moderate aortic stenosis Monitor./  Out patient follow up.   HTN; monitor post op.  Hold triamterene for  now.    Acute blood loss anemia; post sx. Expected.  Hb stable at 9/  Urine retention, foley catheter placed 9-02 Start Flomax.   Episode of cough; check  CBC  Poor oral intake; ensure,   DVT prophylaxis: resume xarelto.  Code Status: DNR Family Communication: ( son at bedside.  Disposition Plan: she will need rehab.   Consultants:   Ortho  Cardiology    Procedures:      Antimicrobials:   prophylaxis    Subjective: Patient flat affect today. More confuse per son.  Develop urine retention overnight.  Had Small BM yesterday.  She had coughing episode  today.. Felt something in her throat.   Objective: Vitals:   04/17/18 0958 04/17/18 1429 04/17/18  2024 04/18/18 0505  BP: 121/64 (!) 131/104 (!) 109/59 137/60  Pulse: (!) 39 (!) 104 67 60  Resp: 14  16 16   Temp:   97.9 F (36.6 C) 98.4 F (36.9 C)  TempSrc:   Oral Oral  SpO2: 97% 97% 96% 98%  Weight:      Height:        Intake/Output Summary (Last 24 hours) at 04/18/2018 0915 Last data filed at 04/18/2018 0800 Gross per 24 hour  Intake -  Output 850 ml  Net -850 ml   Filed Weights   04/13/18 0545  Weight: 75 kg    Examination:  General exam: NAD Respiratory system; CTA Cardiovascular system: S 1, S 2 RRR Gastrointestinal system: BS present, soft, nt Central nervous system: Alert, follows command.  Extremities; left LE with dressing.  Skin: No rashes, lesions or ulcers   Data Reviewed: I have personally reviewed following labs and imaging studies  CBC: Recent Labs  Lab 04/13/18 0613 04/14/18 1447 04/15/18 0447 04/16/18 0526 04/17/18 0805  WBC 8.6 14.1* 12.1* 9.9  --   NEUTROABS 6.5  --   --   --   --   HGB 13.9 12.5 10.7* 9.6* 9.4*  HCT 43.0 38.3 33.1* 30.0* 29.9*  MCV 96.2 94.6 94.3 96.5  --   PLT 247 249 225 204  --    Basic Metabolic Panel: Recent Labs  Lab 04/13/18 0613 04/15/18 0447 04/17/18 0955  NA 140 140 142  K 4.1 3.9 4.1  CL 105 105 108  CO2 27 28 28   GLUCOSE 147* 142* 113*  BUN 20 16 23   CREATININE 1.41* 1.00 0.92  CALCIUM 9.3 8.7* 8.0*  MG  --   --  2.1   GFR: Estimated Creatinine Clearance: 38.6 mL/min (by C-G formula based on SCr of 0.92 mg/dL). Liver Function Tests: No results for input(s): AST, ALT, ALKPHOS, BILITOT, PROT, ALBUMIN in the last 168 hours. No results for input(s): LIPASE, AMYLASE in the last 168 hours. No results for input(s): AMMONIA in the last 168 hours. Coagulation Profile: Recent Labs  Lab 04/13/18 0632  INR 2.14   Cardiac Enzymes: No results for input(s): CKTOTAL, CKMB, CKMBINDEX, TROPONINI in the last 168 hours. BNP (last 3 results) No results for input(s): PROBNP in the last 8760  hours. HbA1C: No results for input(s): HGBA1C in the last 72 hours. CBG: Recent Labs  Lab 04/14/18 1015  GLUCAP 149*   Lipid Profile: No results for input(s): CHOL, HDL, LDLCALC, TRIG, CHOLHDL, LDLDIRECT in the last 72 hours. Thyroid Function Tests: No results for input(s): TSH, T4TOTAL, FREET4, T3FREE, THYROIDAB in the last 72 hours. Anemia Panel: No results for input(s): VITAMINB12, FOLATE, FERRITIN, TIBC, IRON, RETICCTPCT in the last 72 hours. Sepsis Labs: No results for input(s): PROCALCITON, LATICACIDVEN in the last 168 hours.  Recent Results (from the past 240 hour(s))  MRSA PCR Screening     Status: None   Collection  Time: 04/13/18  6:34 PM  Result Value Ref Range Status   MRSA by PCR NEGATIVE NEGATIVE Final    Comment:        The GeneXpert MRSA Assay (FDA approved for NASAL specimens only), is one component of a comprehensive MRSA colonization surveillance program. It is not intended to diagnose MRSA infection nor to guide or monitor treatment for MRSA infections. Performed at [facility], [address] [place], [place] [address]          Radiology Studies: No results found.      Scheduled Meds: . bisoprolol  10 mg Oral Daily  . cholecalciferol  1,000 Units Oral q morning - 10a  . docusate sodium  100 mg Oral BID  . feeding supplement (ENSURE ENLIVE)  237 mL Oral TID BM  . iron polysaccharides  150 mg Oral Daily  . levothyroxine  125 mcg Oral QAC breakfast  . multivitamin with minerals  1 tablet Oral Daily  . polyethylene glycol  17 g Oral Daily  . rivaroxaban  15 mg Oral Q supper  . senna  1 tablet Oral Daily  . trimethoprim  100 mg Oral Daily   Continuous Infusions: . sodium chloride 65 mL/hr at 04/17/18 1603  . methocarbamol (ROBAXIN) IV Stopped (04/15/18 0026)     LOS: 5 days    Time spent: 35 minutes.     Alba Cory, MD [facility] Pager [phone]  If 7PM-7AM, please contact  night-coverage [email or web address] Password [email or web address] 04/18/2018, 9:15 AM

## 2018-04-19 ENCOUNTER — Inpatient Hospital Stay (HOSPITAL_COMMUNITY): Payer: Medicare Other

## 2018-04-19 LAB — CBC
HEMATOCRIT: 30.3 % — AB (ref 36.0–46.0)
HEMOGLOBIN: 9.6 g/dL — AB (ref 12.0–15.0)
MCH: 30.7 pg (ref 26.0–34.0)
MCHC: 31.7 g/dL (ref 30.0–36.0)
MCV: 96.8 fL (ref 78.0–100.0)
Platelets: 260 10*3/uL (ref 150–400)
RBC: 3.13 MIL/uL — ABNORMAL LOW (ref 3.87–5.11)
RDW: 13.7 % (ref 11.5–15.5)
WBC: 7.5 10*3/uL (ref 4.0–10.5)

## 2018-04-19 LAB — URINALYSIS, ROUTINE W REFLEX MICROSCOPIC
BILIRUBIN URINE: NEGATIVE
GLUCOSE, UA: NEGATIVE mg/dL
KETONES UR: NEGATIVE mg/dL
Nitrite: NEGATIVE
PROTEIN: 30 mg/dL — AB
SPECIFIC GRAVITY, URINE: 1.017 (ref 1.005–1.030)
WBC, UA: >50 WBC/hpf — ABNORMAL HIGH (ref 0–5)
pH: 9 — ABNORMAL HIGH (ref 5.0–8.0)

## 2018-04-19 LAB — BASIC METABOLIC PANEL
ANION GAP: 6 (ref 5–15)
BUN: 19 mg/dL (ref 8–23)
CALCIUM: 8.4 mg/dL — AB (ref 8.9–10.3)
CHLORIDE: 110 mmol/L (ref 98–111)
CO2: 27 mmol/L (ref 22–32)
Creatinine, Ser: 0.78 mg/dL (ref 0.44–1.00)
GFR calc non Af Amer: >60 mL/min (ref >60)
GLUCOSE: 122 mg/dL — AB (ref 70–99)
POTASSIUM: 4.5 mmol/L (ref 3.5–5.1)
Sodium: 143 mmol/L (ref 135–145)

## 2018-04-19 MED ORDER — SODIUM CHLORIDE 0.9 % IV SOLN
1.0000 g | INTRAVENOUS | Status: DC
  Administered 2018-04-19 – 2018-04-20 (×2): 1 g via INTRAVENOUS
  Filled 2018-04-19 (×2): qty 10

## 2018-04-19 NOTE — Plan of Care (Signed)
  Problem: Education: Goal: Knowledge of General Education information will improve Description: Including pain rating scale, medication(s)/side effects and non-pharmacologic comfort measures Outcome: Progressing   Problem: Health Behavior/Discharge Planning: Goal: Ability to manage health-related needs will improve Outcome: Progressing   Problem: Activity: Goal: Risk for activity intolerance will decrease Outcome: Progressing   Problem: Elimination: Goal: Will not experience complications related to bowel motility Outcome: Progressing   Problem: Pain Managment: Goal: General experience of comfort will improve Outcome: Progressing   Problem: Safety: Goal: Ability to remain free from injury will improve Outcome: Progressing   

## 2018-04-19 NOTE — Progress Notes (Signed)
Physical Therapy Treatment Patient Details Name: Makayla Thomas MRN: [id number] DOB: [birth year] Today's Date: 04/19/2018    History of Present Illness 84yo female who fell in her ALF; imaging positive for periprosthetic femur fracture, L bimalleolar ankle fracture. She received surgery for placement of L LE ankle and femur ORIFs on 04/14/18. PMH aortic stenosis, DM, MS, HTN, hypothyroidism, A-fib, TIA, visual disturbance, L TKR, hx femur ORIF, R THA     PT Comments    Pt performed supine to sit and lateral scoot to drop arm recliner with total assistance +2.  Increase in assistance likely due to cognition as her sitting balance improved sitting edge of bed briefly.  Pt sitting comfortably in recliner with posey activity mat.  Continue to recommend SNF placement to improve strength and function.     Follow Up Recommendations  SNF     Equipment Recommendations  Other (comment)(defer to next venue)    Recommendations for Other Services       Precautions / Restrictions Precautions Precautions: Fall Precaution Comments: family reports increased number of falls recently at home  Restrictions Weight Bearing Restrictions: Yes LLE Weight Bearing: Non weight bearing Other Position/Activity Restrictions: has short leg splint L LE     Mobility  Bed Mobility   Bed Mobility: Supine to Sit;Rolling Rolling: Mod assist   Supine to sit: Total assist;+2 for physical assistance     General bed mobility comments: extended time due to L LE pain, assist for LLE over EOB and to elevate trunk, use of bed pad to scoot hips; use of helicoptor method for trunk/LE support when returning to supine   Transfers Overall transfer level: Needs assistance Equipment used: (bed pad) Transfers: Lateral/Scoot Transfers Sit to Stand: Total assist;+2 physical assistance        Lateral/Scoot Transfers: Total assist;+2 physical assistance General transfer comment: Pt with improved sitting balance once moved to  edge of bed.  She required total assistance with use of bed pad and +2 assist to laterally scoot from bed to recliner ( drop arm ).  Pt attempting to use UE to assist but minimal effort on patient.    Ambulation/Gait Ambulation/Gait assistance: (NT)               Stairs             Wheelchair Mobility    Modified Rankin (Stroke Patients Only)       Balance Overall balance assessment: Needs assistance Sitting-balance support: Bilateral upper extremity supported;Feet supported Sitting balance-Leahy Scale: Zero(zero to fair.  Once sitting edge of bed improved to fair.  ) Sitting balance - Comments: Pt intially pushing backwards and to the R.  Once sitting further at edge of bed able to sit unassisted for brief period of time.       Standing balance-Leahy Scale: (NT)                              Cognition Arousal/Alertness: Awake/alert Behavior During Therapy: Impulsive Overall Cognitive Status: Impaired/Different from baseline Area of Impairment: Orientation;Attention;Safety/judgement;Problem solving;Following commands;Memory                 Orientation Level: Disoriented to;Place;Time;Situation Current Attention Level: Sustained Memory: Decreased recall of precautions(short term memory is intact.  ) Following Commands: Follows one step commands with increased time Safety/Judgement: Decreased awareness of safety;Decreased awareness of deficits   Problem Solving: Decreased initiation;Difficulty sequencing;Requires verbal cues;Slow processing General Comments: pt with baseline cognitive impairments,  Pt requires multimodal cues for safety and to follow instructions; also noted to have difficulty sequencing simple tasks.      Exercises      General Comments        Pertinent Vitals/Pain Pain Assessment: Faces Faces Pain Scale: Hurts little more Pain Location: L LE  Pain Descriptors / Indicators: Aching;Sore;Throbbing Pain Intervention(s):  Monitored during session;Repositioned    Home Living                      Prior Function            PT Goals (current goals can now be found in the care plan section) Acute Rehab PT Goals Patient Stated Goal: less pain in leg  Potential to Achieve Goals: Fair Progress towards PT goals: Progressing toward goals    Frequency    Min 2X/week      PT Plan Current plan remains appropriate    Co-evaluation PT/OT/SLP Co-Evaluation/Treatment: Yes Reason for Co-Treatment: Complexity of the patient's impairments (multi-system involvement);Necessary to address cognition/behavior during functional activity;For patient/therapist safety(will not tolerate multiple sessions.  ) PT goals addressed during session: Mobility/safety with mobility OT goals addressed during session: ADL's and self-care      AM-PAC PT "6 Clicks" Daily Activity  Outcome Measure  Difficulty turning over in bed (including adjusting bedclothes, sheets and blankets)?: Unable Difficulty moving from lying on back to sitting on the side of the bed? : Unable Difficulty sitting down on and standing up from a chair with arms (e.g., wheelchair, bedside commode, etc,.)?: Unable Help needed moving to and from a bed to chair (including a wheelchair)?: A Lot Help needed walking in hospital room?: Total Help needed climbing 3-5 steps with a railing? : Total 6 Click Score: 7    End of Session Equipment Utilized During Treatment: Gait belt Activity Tolerance: Patient tolerated treatment well;Patient limited by pain Patient left: with call bell/phone within reach;with family/visitor present;in chair;with chair alarm set(Pt sitting at nurses desk with AD to avoid pulling on IV site.  ) Nurse Communication: Mobility status PT Visit Diagnosis: Unsteadiness on feet (R26.81);Difficulty in walking, not elsewhere classified (R26.2);History of falling (Z91.81);Muscle weakness (generalized) (M62.81)     Time: 3500-9381 PT  Time Calculation (min) (ACUTE ONLY): 31 min  Charges:  $Therapeutic Activity: 8-22 mins                     Joycelyn Rua, PTA pager [phone]    Florestine Avers 04/19/2018, 4:19 PM

## 2018-04-19 NOTE — Progress Notes (Signed)
PROGRESS NOTE    Makayla Thomas  ZOX:[id number] DOB: 1926-07-18 DOA: 04/13/2018 PCP: Merri Brunette, MD    Brief Narrative:: Makayla Thomas is a 84 y.o. female with medical history significant of TIA; afib on Xarelto; moderate AS; PAD; hypothyroidism; HTN;  MS; HLD; and DM presenting with tibial fracture.  She is unable to provide history due to her dementia.     son [name])  reports that she has been receiving all of her medications packaged today through the pharmacy.  The family realized recently that the package did not include some of her pills and she had not been taking bisoprolol and another unspecified medication for at least a week or more.  The pharmacy provided the packaged medications and 2 "loose pills" starting last week.   Her son saw her last week and she was doing fairly well.  He thinks this fall might be related to resumption of bisoprolol.  In the last week, she has really gone downhill - he thinks it was related to this medication change.  As a result, the family called Dr. Elissa Hefty office and he recommended stopping Cardizem instead of Bisoprolol - this has not happened yet.  At her 89th birthday party, they also discussed code status - but she would not commit.  Her sons decided together that she should be DNR.  After her prior hip replacement, post-anesthesia or with pain medication, she had serious hallucinations.  ED Course:   Mechanical fall .  On Xarelto for afib, mild dementia.  Fall on left side - periprosthetic femur fracture, left bimalleolar fracture.  [place].  Orthopedics consulted, possible ORIF for both this afternoon.  Hospital course;  Patient admitted 8-28 after mechanical fall found to have left periprosthetic femur fracture and left bimalleolar fracture. Underwent sx 8-29.  She develops urinary retention. Diagnosed with UTI 9-03. Started on IV ceftriaxone. MRI was done due to AMS, and to rule out stroke.     Assessment & Plan:   Principal Problem:   Supracondylar fracture of left femur, closed, initial encounter (HCC) Active Problems:   Permanent atrial fibrillation (HCC); CHA2DS2-VASc Score 6   Chronic anticoagulation   Dyslipidemia, goal LDL below 100 - due aortic stenosis   Essential hypertension -well controlled   Hypothyroidism   Trimalleolar fracture of ankle, closed, left, initial encounter   CKD (chronic kidney disease) stage 3, GFR 30-59 ml/min (HCC)   Hyperglycemia   Dementia  Left Hip and trimalleolar ankle fracture;  PT per ortho.  Post op  8-29. S/P ORIF, left bimalleolar ankle fracture post ORIF.  Schedule miralax for bowel regimen,  Ok to resume xarelto.  Pain management, avoid oversedation.  Ancef for 24 hours.   Permanent A fib;  Discussed with Dr Herbie Baltimore, patient 's son concern regarding Cardizem and making patient sleepy and tired. Plan to hold Cardizem for now. Continue with bisoprolol/  -On  xarelto.  -HR controlled.   Confusion; Delirium:  Worsening confusion, weakness over last 3 week. Some changes in cognition for last 3 months.  B 12 normal. .  TSH normal.  Vitamin D level.  Per son , patient is more confuse. MRI ordered which was negative for stroke.  UA ordered with evidence of UTI. Urine culture ordered. Started IV ceftriaxone 9-03.  UTI;  Develops urine retention 9-02 UA showed UTI.  Started ceftriaxone.  Follow urine culture.    Mild to moderate aortic stenosis Monitor./  Out patient follow up.   HTN; monitor  post op.  Hold triamterene for  now.    Acute blood loss anemia; post sx. Expected.  Hb stable at 9/  Urine retention, foley catheter placed 9-02 Started  Flomax.   Episode of cough; resolved   Poor oral intake; ensure,   DVT prophylaxis: resume xarelto.  Code Status: DNR Family Communication: son over phone Disposition Plan: she will need rehab.   Consultants:   Ortho  Cardiology    Procedures:      Antimicrobials:   prophylaxis     Subjective: Alert this am. pleasantly confuse.    Objective: Vitals:   04/18/18 1520 04/18/18 1923 04/18/18 2100 04/19/18 0414  BP: 129/73 117/65  110/61  Pulse: 65 (!) 45 79 78  Resp:  16  16  Temp:  98.5 F (36.9 C)  98.8 F (37.1 C)  TempSrc:  Oral  Oral  SpO2: 99% 96%  98%  Weight:      Height:        Intake/Output Summary (Last 24 hours) at 04/19/2018 1349 Last data filed at 04/19/2018 0915 Gross per 24 hour  Intake 480 ml  Output 750 ml  Net -270 ml   Filed Weights   04/13/18 0545  Weight: 75 kg    Examination:  General exam: NAD Respiratory system; CTA Cardiovascular system: S 1, S 2 RRR Gastrointestinal system: BS present, soft, nt Central nervous system: alert, non focal.  Extremities; left LE with dressing.  Skin: No rashes.    Data Reviewed: I have personally reviewed following labs and imaging studies  CBC: Recent Labs  Lab 04/13/18 0613 04/14/18 1447 04/15/18 0447 04/16/18 0526 04/17/18 0805 04/18/18 1507 04/19/18 0357  WBC 8.6 14.1* 12.1* 9.9  --  8.0 7.5  NEUTROABS 6.5  --   --   --   --   --   --   HGB 13.9 12.5 10.7* 9.6* 9.4* 10.4* 9.6*  HCT 43.0 38.3 33.1* 30.0* 29.9* 33.4* 30.3*  MCV 96.2 94.6 94.3 96.5  --  98.2 96.8  PLT 247 249 225 204  --  274 260   Basic Metabolic Panel: Recent Labs  Lab 04/13/18 0613 04/15/18 0447 04/17/18 0955 04/19/18 0357  NA 140 140 142 143  K 4.1 3.9 4.1 4.5  CL 105 105 108 110  CO2 27 28 28 27   GLUCOSE 147* 142* 113* 122*  BUN 20 16 23 19   CREATININE 1.41* 1.00 0.92 0.78  CALCIUM 9.3 8.7* 8.0* 8.4*  MG  --   --  2.1  --    GFR: Estimated Creatinine Clearance: 44.3 mL/min (by C-G formula based on SCr of 0.78 mg/dL). Liver Function Tests: No results for input(s): AST, ALT, ALKPHOS, BILITOT, PROT, ALBUMIN in the last 168 hours. No results for input(s): LIPASE, AMYLASE in the last 168 hours. Recent Labs  Lab 04/18/18 1507  AMMONIA 16   Coagulation Profile: Recent Labs  Lab  04/13/18 0632  INR 2.14   Cardiac Enzymes: No results for input(s): CKTOTAL, CKMB, CKMBINDEX, TROPONINI in the last 168 hours. BNP (last 3 results) No results for input(s): PROBNP in the last 8760 hours. HbA1C: No results for input(s): HGBA1C in the last 72 hours. CBG: Recent Labs  Lab 04/14/18 1015  GLUCAP 149*   Lipid Profile: No results for input(s): CHOL, HDL, LDLCALC, TRIG, CHOLHDL, LDLDIRECT in the last 72 hours. Thyroid Function Tests: No results for input(s): TSH, T4TOTAL, FREET4, T3FREE, THYROIDAB in the last 72 hours. Anemia Panel: No results for input(s): VITAMINB12, FOLATE,  FERRITIN, TIBC, IRON, RETICCTPCT in the last 72 hours. Sepsis Labs: No results for input(s): PROCALCITON, LATICACIDVEN in the last 168 hours.  Recent Results (from the past 240 hour(s))  MRSA PCR Screening     Status: None   Collection Time: 04/13/18  6:34 PM  Result Value Ref Range Status   MRSA by PCR NEGATIVE NEGATIVE Final    Comment:        The GeneXpert MRSA Assay (FDA approved for NASAL specimens only), is one component of a comprehensive MRSA colonization surveillance program. It is not intended to diagnose MRSA infection nor to guide or monitor treatment for MRSA infections. Performed at [facility], [address], [place], [place] [address]          Radiology Studies: Mr Brain Wo Contrast  Result Date: 04/19/2018 CLINICAL DATA:  Confusion. EXAM: MRI HEAD WITHOUT CONTRAST TECHNIQUE: Multiplanar, multiecho pulse sequences of the brain and surrounding structures were obtained without intravenous contrast. COMPARISON:  Head CT 04/13/2018 and MRI 03/02/2016 FINDINGS: Brain: There is no evidence of acute infarct, mass, midline shift, or extra-axial fluid collection. Chronic microhemorrhages are again seen in the thalami. There is also a chronic microhemorrhage in the pons which is new from the prior MRI. Patchy T2 hyperintensities in the cerebral white matter and pons are  unchanged from the prior MRI and nonspecific but compatible with moderate chronic small vessel ischemic disease. Numerous dilated perivascular spaces are again seen throughout the basal ganglia bilaterally. There also appear to be chronic lacunar infarcts along the posterior and superior aspects of the right lentiform nucleus. Small chronic infarcts are also present in the cerebellum bilaterally. There is moderate cerebral atrophy. Vascular: Major intracranial vascular flow voids are preserved. Skull and upper cervical spine: Unremarkable bone marrow signal. Sinuses/Orbits: Bilateral cataract extraction. Paranasal sinuses and mastoid air cells are clear. Other: None. IMPRESSION: 1. No acute intracranial abnormality. 2. Moderate chronic small vessel ischemic disease and cerebral atrophy. Electronically Signed   By: Sebastian Ache M.D.   On: 04/19/2018 12:42        Scheduled Meds: . bisoprolol  10 mg Oral Daily  . cholecalciferol  1,000 Units Oral q morning - 10a  . docusate sodium  100 mg Oral BID  . famotidine  20 mg Oral Daily  . feeding supplement (ENSURE ENLIVE)  237 mL Oral TID BM  . iron polysaccharides  150 mg Oral Daily  . levothyroxine  125 mcg Oral QAC breakfast  . multivitamin with minerals  1 tablet Oral Daily  . polyethylene glycol  17 g Oral BID  . rivaroxaban  15 mg Oral Q supper  . senna  1 tablet Oral BID  . tamsulosin  0.4 mg Oral Daily  . trimethoprim  100 mg Oral Daily   Continuous Infusions: . cefTRIAXone (ROCEPHIN)  IV 1 g (04/19/18 1209)     LOS: 6 days    Time spent: 35 minutes.     Alba Cory, MD [facility] Pager [phone]  If 7PM-7AM, please contact night-coverage [email or web address] Password [email or web address] 04/19/2018, 1:49 PM

## 2018-04-19 NOTE — Progress Notes (Signed)
Occupational Therapy Treatment Patient Details Name: Makayla Thomas MRN: [id number] DOB: 1926-01-08 Today's Date: 04/19/2018    History of present illness 84yo female who fell in her ALF; imaging positive for periprosthetic femur fracture, L bimalleolar ankle fracture. She received surgery for placement of L LE ankle and femur ORIFs on 04/14/18. PMH aortic stenosis, DM, MS, HTN, hypothyroidism, A-fib, TIA, visual disturbance, L TKR, hx femur ORIF, R THA    OT comments  Pt presents supine in bed, is pleasantly confused during therapy session but agreeable to working with therapy. Pt requiring totalA+2 to perform lateral/scoot transfer to drop arm recliner. Pt initially requiring mod-maxA for sitting balance EOB prior to transfer, but improves given increased time. Pt continues to require totalA for LB ADLs, setup assist and verbal cues for completing simple grooming ADLs. Pt requires multimodal cues and redirection to task throughout session. Feel SNF recommendation remains appropriate at this time. Continue per POC.    Follow Up Recommendations  SNF;Supervision/Assistance - 24 hour    Equipment Recommendations  3 in 1 bedside commode;Other (comment)(TBD in next venue)          Precautions / Restrictions Precautions Precautions: Fall Precaution Comments: family reports increased number of falls recently at home  Restrictions Weight Bearing Restrictions: Yes LLE Weight Bearing: Non weight bearing Other Position/Activity Restrictions: has short leg splint L LE        Mobility Bed Mobility Overal bed mobility: Needs Assistance Bed Mobility: Supine to Sit;Rolling Rolling: Mod assist   Supine to sit: Total assist;+2 for physical assistance     General bed mobility comments: extended time due to L LE pain, assist for LLE over EOB and to elevate trunk, use of bed pad to scoot hips; use of helicoptor method for trunk/LE support when returning to supine   Transfers Overall transfer  level: Needs assistance Equipment used: (bed pad ) Transfers: Lateral/Scoot Transfers Sit to Stand: Total assist;+2 physical assistance        Lateral/Scoot Transfers: Total assist;+2 physical assistance General transfer comment: Pt with improved sitting balance once moved to edge of bed.  She required total assistance with use of bed pad and +2 assist to laterally scoot from bed to recliner ( drop arm ).  Pt attempting to use UE to assist but minimal effort on patient.      Balance Overall balance assessment: Needs assistance Sitting-balance support: Bilateral upper extremity supported;Feet supported Sitting balance-Leahy Scale: Zero(zero to fair, once seated EOB sitting balance improved ) Sitting balance - Comments: Pt intially pushing backwards and to the R.  Once sitting further at edge of bed able to sit unassisted for brief period of time.       Standing balance-Leahy Scale: (NT)                             ADL either performed or assessed with clinical judgement   ADL Overall ADL's : Needs assistance/impaired     Grooming: Set up;Sitting;Brushing hair Grooming Details (indicate cue type and reason): sitting in recliner; VCs for initiating task                              Functional mobility during ADLs: Total assistance;+2 for physical assistance;+2 for safety/equipment(for lateral/scoot transfer ) General ADL Comments: assisted with lateral/scoot transfer to recliner and setup for grooming ADLs; additional focus on performing fine motor tasks seated in recliner  Cognition Arousal/Alertness: Awake/alert Behavior During Therapy: Impulsive Overall Cognitive Status: Impaired/Different from baseline Area of Impairment: Orientation;Attention;Safety/judgement;Problem solving;Following commands;Memory                 Orientation Level: Disoriented to;Place;Time;Situation Current Attention Level: Sustained Memory:  Decreased recall of precautions(short term memory is intact.  ) Following Commands: Follows one step commands with increased time Safety/Judgement: Decreased awareness of safety;Decreased awareness of deficits   Problem Solving: Decreased initiation;Difficulty sequencing;Requires verbal cues;Slow processing General Comments: pt with baseline cognitive impairments, Pt requires multimodal cues for safety and to follow instructions; also noted to have difficulty sequencing simple tasks.        Exercises     Shoulder Instructions       General Comments      Pertinent Vitals/ Pain       Pain Assessment: Faces Faces Pain Scale: Hurts little more Pain Location: L LE  Pain Descriptors / Indicators: Aching;Sore;Throbbing Pain Intervention(s): Monitored during session;Repositioned  Home Living                                          Prior Functioning/Environment              Frequency  Min 2X/week        Progress Toward Goals  OT Goals(current goals can now be found in the care plan section)  Progress towards OT goals: Progressing toward goals  Acute Rehab OT Goals Patient Stated Goal: less pain in leg  OT Goal Formulation: With patient Time For Goal Achievement: 04/29/18 Potential to Achieve Goals: Good  Plan Discharge plan remains appropriate    Co-evaluation    PT/OT/SLP Co-Evaluation/Treatment: Yes Reason for Co-Treatment: Complexity of the patient's impairments (multi-system involvement);For patient/therapist safety;To address functional/ADL transfers(will not tolerate multiple sessions) PT goals addressed during session: Mobility/safety with mobility OT goals addressed during session: ADL's and self-care      AM-PAC PT "6 Clicks" Daily Activity     Outcome Measure   Help from another person eating meals?: A Little Help from another person taking care of personal grooming?: A Little Help from another person toileting, which includes  using toliet, bedpan, or urinal?: Total Help from another person bathing (including washing, rinsing, drying)?: A Lot Help from another person to put on and taking off regular upper body clothing?: A Lot Help from another person to put on and taking off regular lower body clothing?: Total 6 Click Score: 12    End of Session    OT Visit Diagnosis: Muscle weakness (generalized) (M62.81);Other abnormalities of gait and mobility (R26.89);Pain Pain - Right/Left: Left Pain - part of body: Knee;Leg   Activity Tolerance Patient tolerated treatment well   Patient Left in chair;with call bell/phone within reach;Other (comment);with chair alarm set(seated at nurses station )   Nurse Communication Mobility status        Time: [phone] OT Time Calculation (min): 31 min  Charges: OT General Charges $OT Visit: 1 Visit OT Treatments $Self Care/Home Management : 8-22 mins  Marcy Siren, OT Pager [phone] 04/19/2018    Orlando Penner 04/19/2018, 4:35 PM

## 2018-04-20 DIAGNOSIS — Z7901 Long term (current) use of anticoagulants: Secondary | ICD-10-CM | POA: Diagnosis not present

## 2018-04-20 DIAGNOSIS — S72452A Displaced supracondylar fracture without intracondylar extension of lower end of left femur, initial encounter for closed fracture: Secondary | ICD-10-CM | POA: Diagnosis not present

## 2018-04-20 DIAGNOSIS — S82852D Displaced trimalleolar fracture of left lower leg, subsequent encounter for closed fracture with routine healing: Secondary | ICD-10-CM | POA: Diagnosis not present

## 2018-04-20 DIAGNOSIS — I129 Hypertensive chronic kidney disease with stage 1 through stage 4 chronic kidney disease, or unspecified chronic kidney disease: Secondary | ICD-10-CM | POA: Diagnosis not present

## 2018-04-20 DIAGNOSIS — F0390 Unspecified dementia without behavioral disturbance: Secondary | ICD-10-CM | POA: Diagnosis not present

## 2018-04-20 DIAGNOSIS — Z9181 History of falling: Secondary | ICD-10-CM | POA: Diagnosis not present

## 2018-04-20 DIAGNOSIS — S72452D Displaced supracondylar fracture without intracondylar extension of lower end of left femur, subsequent encounter for closed fracture with routine healing: Secondary | ICD-10-CM | POA: Diagnosis not present

## 2018-04-20 DIAGNOSIS — Z96642 Presence of left artificial hip joint: Secondary | ICD-10-CM | POA: Diagnosis not present

## 2018-04-20 DIAGNOSIS — N183 Chronic kidney disease, stage 3 (moderate): Secondary | ICD-10-CM | POA: Diagnosis not present

## 2018-04-20 DIAGNOSIS — S72402D Unspecified fracture of lower end of left femur, subsequent encounter for closed fracture with routine healing: Secondary | ICD-10-CM | POA: Diagnosis not present

## 2018-04-20 DIAGNOSIS — G35 Multiple sclerosis: Secondary | ICD-10-CM | POA: Diagnosis not present

## 2018-04-20 DIAGNOSIS — D509 Iron deficiency anemia, unspecified: Secondary | ICD-10-CM | POA: Diagnosis not present

## 2018-04-20 DIAGNOSIS — E785 Hyperlipidemia, unspecified: Secondary | ICD-10-CM | POA: Diagnosis not present

## 2018-04-20 DIAGNOSIS — Z743 Need for continuous supervision: Secondary | ICD-10-CM | POA: Diagnosis not present

## 2018-04-20 DIAGNOSIS — I4891 Unspecified atrial fibrillation: Secondary | ICD-10-CM | POA: Diagnosis not present

## 2018-04-20 DIAGNOSIS — S93432D Sprain of tibiofibular ligament of left ankle, subsequent encounter: Secondary | ICD-10-CM | POA: Diagnosis not present

## 2018-04-20 DIAGNOSIS — D62 Acute posthemorrhagic anemia: Secondary | ICD-10-CM | POA: Diagnosis not present

## 2018-04-20 DIAGNOSIS — R279 Unspecified lack of coordination: Secondary | ICD-10-CM | POA: Diagnosis not present

## 2018-04-20 DIAGNOSIS — S72492D Other fracture of lower end of left femur, subsequent encounter for closed fracture with routine healing: Secondary | ICD-10-CM | POA: Diagnosis not present

## 2018-04-20 DIAGNOSIS — I482 Chronic atrial fibrillation, unspecified: Secondary | ICD-10-CM | POA: Diagnosis not present

## 2018-04-20 DIAGNOSIS — E1165 Type 2 diabetes mellitus with hyperglycemia: Secondary | ICD-10-CM | POA: Diagnosis not present

## 2018-04-20 DIAGNOSIS — I10 Essential (primary) hypertension: Secondary | ICD-10-CM | POA: Diagnosis not present

## 2018-04-20 DIAGNOSIS — E039 Hypothyroidism, unspecified: Secondary | ICD-10-CM | POA: Diagnosis not present

## 2018-04-20 DIAGNOSIS — I4821 Permanent atrial fibrillation: Secondary | ICD-10-CM | POA: Diagnosis not present

## 2018-04-20 DIAGNOSIS — S72302D Unspecified fracture of shaft of left femur, subsequent encounter for closed fracture with routine healing: Secondary | ICD-10-CM | POA: Diagnosis not present

## 2018-04-20 DIAGNOSIS — R739 Hyperglycemia, unspecified: Secondary | ICD-10-CM | POA: Diagnosis not present

## 2018-04-20 DIAGNOSIS — Z96652 Presence of left artificial knee joint: Secondary | ICD-10-CM | POA: Diagnosis not present

## 2018-04-20 DIAGNOSIS — I350 Nonrheumatic aortic (valve) stenosis: Secondary | ICD-10-CM | POA: Diagnosis not present

## 2018-04-20 LAB — CBC
HEMATOCRIT: 29.4 % — AB (ref 36.0–46.0)
Hemoglobin: 9.2 g/dL — ABNORMAL LOW (ref 12.0–15.0)
MCH: 30.8 pg (ref 26.0–34.0)
MCHC: 31.3 g/dL (ref 30.0–36.0)
MCV: 98.3 fL (ref 78.0–100.0)
Platelets: 269 10*3/uL (ref 150–400)
RBC: 2.99 MIL/uL — ABNORMAL LOW (ref 3.87–5.11)
RDW: 13.7 % (ref 11.5–15.5)
WBC: 8 10*3/uL (ref 4.0–10.5)

## 2018-04-20 LAB — BASIC METABOLIC PANEL
Anion gap: 7 (ref 5–15)
BUN: 16 mg/dL (ref 8–23)
CALCIUM: 8.3 mg/dL — AB (ref 8.9–10.3)
CO2: 26 mmol/L (ref 22–32)
CREATININE: 0.81 mg/dL (ref 0.44–1.00)
Chloride: 109 mmol/L (ref 98–111)
GFR calc Af Amer: >60 mL/min (ref >60)
GFR calc non Af Amer: >60 mL/min (ref >60)
GLUCOSE: 92 mg/dL (ref 70–99)
Potassium: 4.6 mmol/L (ref 3.5–5.1)
Sodium: 142 mmol/L (ref 135–145)

## 2018-04-20 IMAGING — MR MR HEAD W/O CM
9 of 10 series · 40 of 48 positions shown · non-contrast
Comparison: 08/31/2014.

CLINICAL DATA: 89-year-old hypertensive female with 1 episode of
bright light and dizziness left-side lasting less than 2 minutes 1
week ago. Initial encounter.

EXAM:
MRI HEAD WITHOUT CONTRAST
TECHNIQUE: Multiplanar, multiecho pulse sequences of the brain and surrounding
structures were obtained without intravenous contrast.

[Series 2: T1 · sagittal · 5.0mm · 0.45mm/px · 3 of 23 slices shown]
[im 1/23]
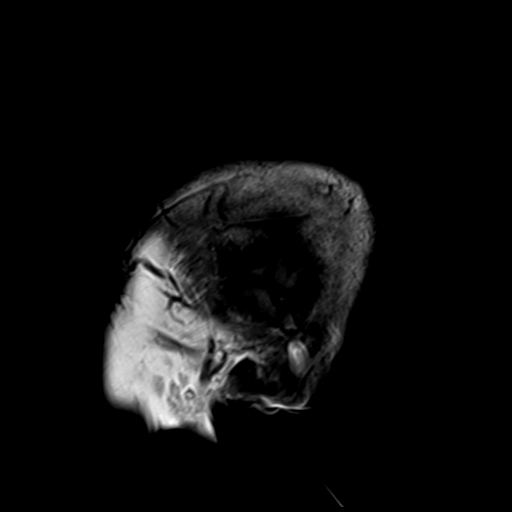
[im 12/23]
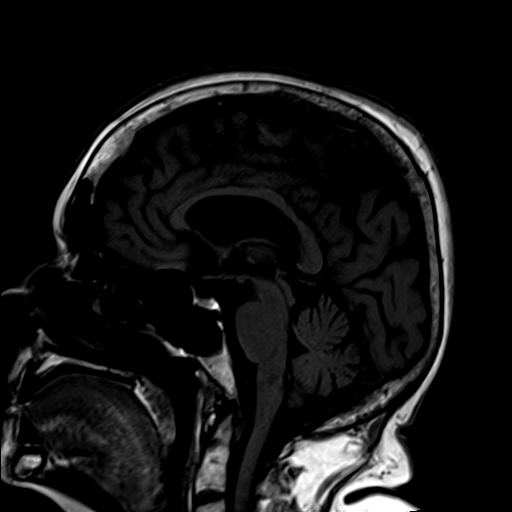
[im 23/23]
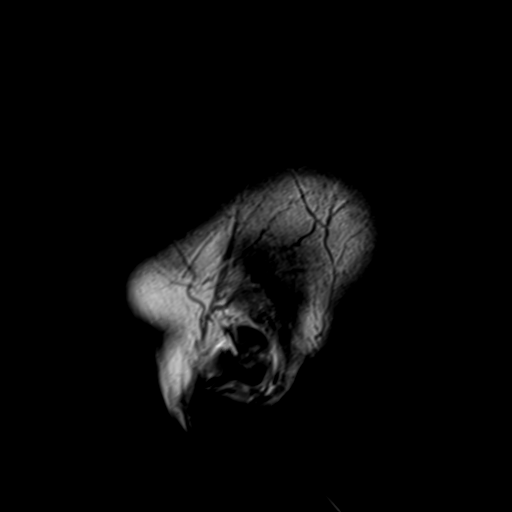

[Series 3: ax ep2d_diff_(id)_trace · axial · 3.0mm · 1.80mm/px · z∈[-67,+74]mm · 10 of 99 slices shown]
[im 1/99]
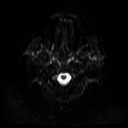
[im 11/99]
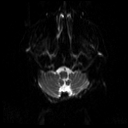
[im 22/99]
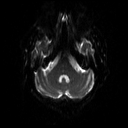
[im 33/99]
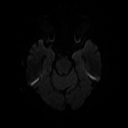
[im 44/99]
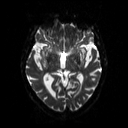
[im 55/99]
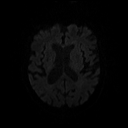
[im 66/99]
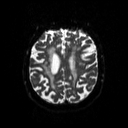
[im 77/99]
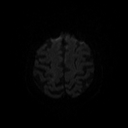
[im 88/99]
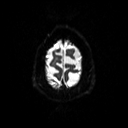
[im 99/99]
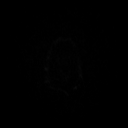

[Series 4: ax ep2d_diff_(id)_trace_adc · axial · 3.0mm · 1.80mm/px · z∈[-67,+74]mm · 5 of 50 slices shown]
[im 1/50]
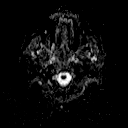
[im 13/50]
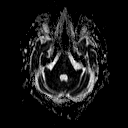
[im 25/50]
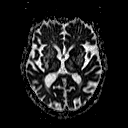
[im 37/50]
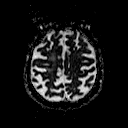
[im 50/50]
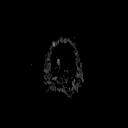

[Series 6: swi_images · axial · 2.0mm · 0.90mm/px · z∈[-71,+80]mm · 8 of 80 slices shown]
[im 1/80]
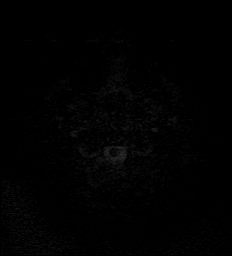
[im 12/80]
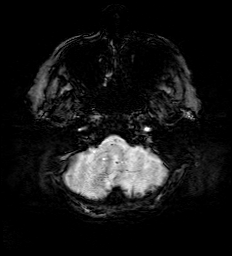
[im 23/80]
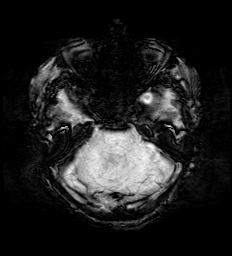
[im 34/80]
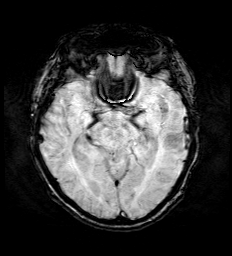
[im 46/80]
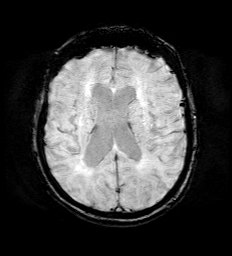
[im 57/80]
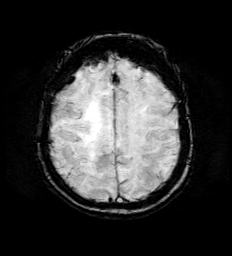
[im 68/80]
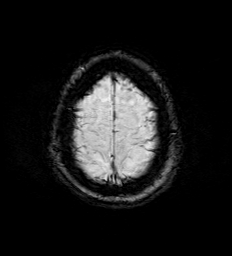
[im 80/80]
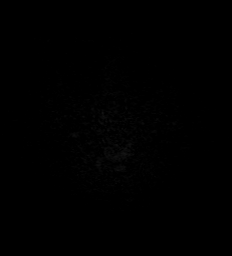

[Series 7: cor ep2d_diff · coronal · 5.0mm · 1.77mm/px · 5 of 45 slices shown]
[im 1/45]
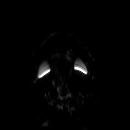
[im 12/45]
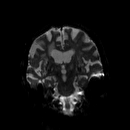
[im 23/45]
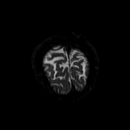
[im 34/45]
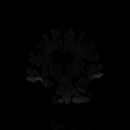
[im 45/45]
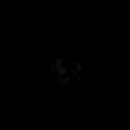

[Series 8: cor ep2d_diff_adc · coronal · 5.0mm · 1.77mm/px · 2 of 24 slices shown]
[im 1/24]
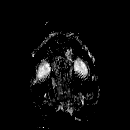
[im 24/24]
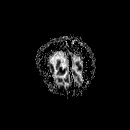

[Series 9: FLAIR · axial · 5.0mm · 0.45mm/px · z∈[-53,+77]mm · 2 of 23 slices shown]
[im 1/23]
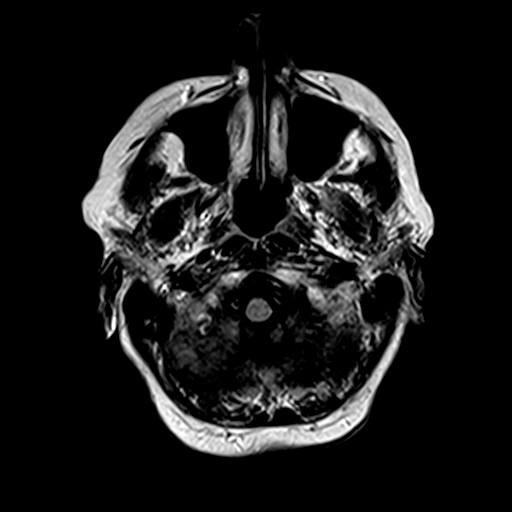
[im 23/23]
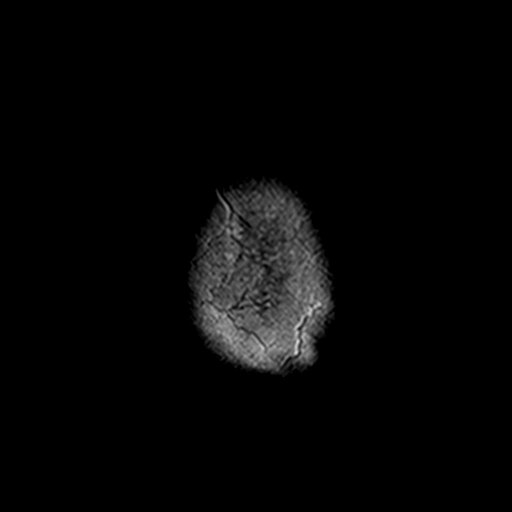

[Series 10: T2 · axial · 5.0mm · 0.60mm/px · z∈[-52,+79]mm · 2 of 23 slices shown (1 of 2)]
[im 1/23]
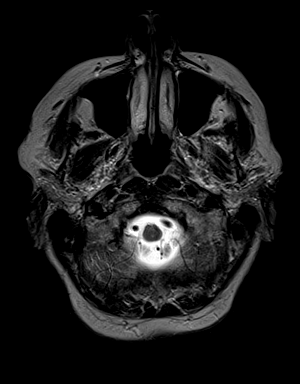
[im 23/23]
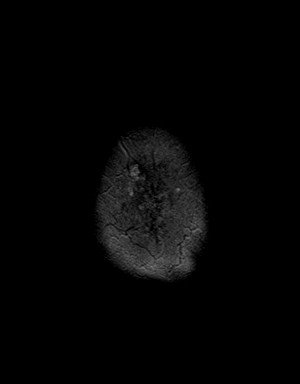

[Series 12: T2 · coronal · 5.0mm · 0.45mm/px · 3 of 27 slices shown (2 of 2)]
[im 1/27]
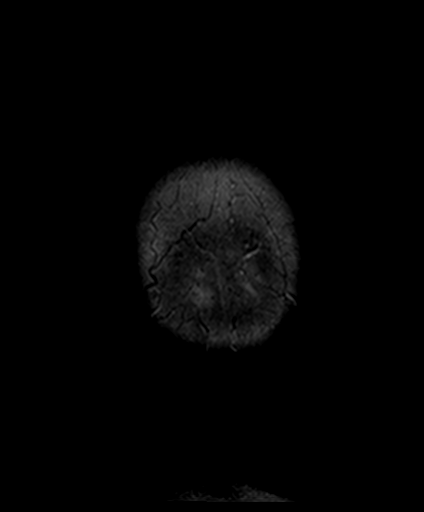
[im 14/27]
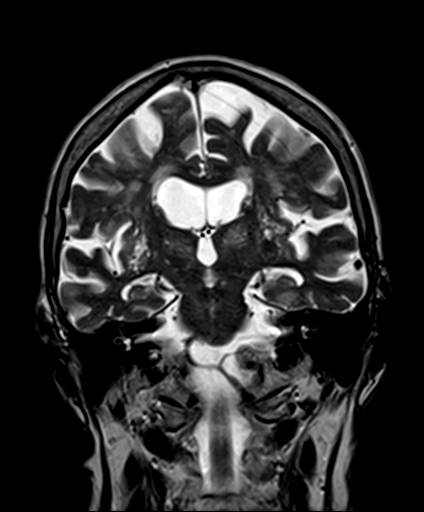
[im 27/27]
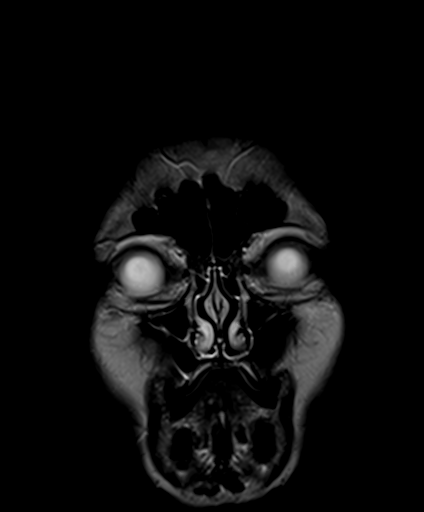

[40 of 48 positions shown; findings below may reference images not displayed]

FINDINGS: No acute infarct or intracranial hemorrhage.

Remote posterior right frontal lobe infarct.

Moderate-to-marked chronic microvascular changes.

Moderate global atrophy without hydrocephalus.

No intracranial mass lesion noted on this unenhanced exam.

Prominent perivascular spaces incidentally noted.

Minimal mucosal thickening ethmoid sinus air cells

Major intracranial vascular structures are patent.

Post lens replacement without acute orbital abnormality.

Cervical medullary junction unremarkable.
IMPRESSION: No acute infarct or intracranial hemorrhage.

Remote posterior right frontal lobe infarct.

Moderate-to-marked chronic microvascular changes.

Moderate global atrophy without hydrocephalus.

No intracranial mass lesion noted on this unenhanced exam.

## 2018-04-20 MED ORDER — TAMSULOSIN HCL 0.4 MG PO CAPS: 0.4000 mg | ORAL_CAPSULE | Freq: Every day | ORAL | 0 refills | Status: AC

## 2018-04-20 MED ORDER — MIRTAZAPINE 7.5 MG PO TABS: 7.5000 mg | ORAL_TABLET | Freq: Every day | ORAL | 0 refills | Status: DC

## 2018-04-20 MED ORDER — MIRTAZAPINE 15 MG PO TABS: 7.5000 mg | ORAL_TABLET | Freq: Every day | ORAL | Status: DC

## 2018-04-20 MED ORDER — CEFDINIR 300 MG PO CAPS
300.0000 mg | ORAL_CAPSULE | Freq: Two times a day (BID) | ORAL | Status: DC
  Filled 2018-04-20: qty 1

## 2018-04-20 MED ORDER — SENNOSIDES-DOCUSATE SODIUM 8.6-50 MG PO TABS: 1.0000 | ORAL_TABLET | Freq: Every day | ORAL | 0 refills | Status: DC

## 2018-04-20 MED ORDER — CEFDINIR 300 MG PO CAPS: 300.0000 mg | ORAL_CAPSULE | Freq: Two times a day (BID) | ORAL | 0 refills | Status: AC

## 2018-04-20 NOTE — Consult Note (Signed)
            [facility] CM Primary Care Navigator  04/20/2018  Makayla Thomas February 16, 1926 [id number]   Went to see patientin the room to identify possible discharge needs butshe wasalreadydischarged perRN report.  Patientwas discharged to skilled nursing facility per therapy recommendation ([facility]). Per Inpatient social worker note, she is a resident of [facility]  Per chart review,patienthad a mechanical fall. She fell on her left side and was found to have periprosthetic femur fracture and left bimalleolar fracture, underwent open reduction internal fixation of left ankle and distal left femur.  Primary care provider's office is listed as providing transition of care (TOC) follow-up.  Patient has discharge instruction to follow-up withprimary care provider, ortho follow-up in 1 week and cardiology in 4 weeks.    For additional questions please contact:  Karin Golden A. August Gosser, BSN, RN-BC [facility] PRIMARY CARE Navigator Cell: [phone]

## 2018-04-20 NOTE — Clinical Social Work Placement (Signed)
   CLINICAL SOCIAL WORK PLACEMENT  NOTE  Date:  04/20/2018  Patient Details  Name: Makayla Thomas MRN: [id number] Date of Birth: Jun 07, 1926  Clinical Social Work is seeking post-discharge placement for this patient at the Skilled  Nursing Facility level of care (*CSW will initial, date and re-position this form in  chart as items are completed):      Patient/family provided with [facility] Clinical Social Work Department's list of facilities offering this level of care within the geographic area requested by the patient (or if unable, by the patient's family).  Yes   Patient/family informed of their freedom to choose among providers that offer the needed level of care, that participate in Medicare, Medicaid or managed care program needed by the patient, have an available bed and are willing to accept the patient.      Patient/family informed of [facility]'s ownership interest in [facility] and [facility], as well as of the fact that they are under no obligation to receive care at these facilities.  PASRR submitted to EDS on       PASRR number received on 04/15/18     Existing PASRR number confirmed on       FL2 transmitted to all facilities in geographic area requested by pt/family on 04/15/18     FL2 transmitted to all facilities within larger geographic area on       Patient informed that his/her managed care company has contracts with or will negotiate with certain facilities, including the following:        Yes   Patient/family informed of bed offers received.  Patient chooses bed at [facility]     Physician recommends and patient chooses bed at      Patient to be transferred to [facility] [place] on 04/20/18.  Patient to be transferred to facility by [facility]     Patient family notified on 04/20/18 of transfer.  Name of family member notified:  Brett Canales, son     PHYSICIAN       Additional Comment:     _______________________________________________ Eduard Roux, LCSWA 04/20/2018, 1:27 PM

## 2018-04-20 NOTE — Discharge Summary (Signed)
Discharge Summary  Makayla Thomas ZOX:[id number] DOB: 12/05/1925  PCP: Merri Brunette, MD  Admit date: 04/13/2018 Discharge date: 04/20/2018  Time spent: , more than 50% time spent on coordination of care  Recommendations for Outpatient Follow-up:  1. F/u with SNF MD  for hospital discharge follow up, repeat cbc/bmp at follow up. snf MD to follow up on final urine culture result. 2. Foley inserted this hospitalization, she is started on flomax, voiding trial in 1-2 days at SNF 3. F/u with ortho Dr Haddix 4. F/u with cardiology Dr Herbie Baltimore  Discharge Diagnoses:  Active Hospital Problems   Diagnosis Date Noted  . Supracondylar fracture of left femur, closed, initial encounter (HCC) 04/13/2018  . Trimalleolar fracture of ankle, closed, left, initial encounter 04/13/2018  . CKD (chronic kidney disease) stage 3, GFR 30-59 ml/min (HCC) 04/13/2018  . Hyperglycemia 04/13/2018  . Dementia 04/13/2018  . Hypothyroidism 02/05/2015  . Essential hypertension -well controlled 01/10/2013  . Chronic anticoagulation   . Dyslipidemia, goal LDL below 100 - due aortic stenosis   . Permanent atrial fibrillation (HCC); CHA2DS2-VASc Score 6     Resolved Hospital Problems  No resolved problems to display.    Discharge Condition: stable  Diet recommendation: heart healthy  Filed Weights   04/13/18 0545  Weight: 75 kg    History of present illness: (per admitting MD Dr Ophelia Charter) PCP: Merri Brunette, MD Consultants:  Herbie Baltimore - cardiology; Charlann Boxer - orthopedics Patient coming from: [facility]; NOK: sons, Makayla Thomas, [phone]; Makayla Thomas, [phone]  Chief Complaint: fall  HPI: Makayla Thomas is a 84 y.o. female with medical history significant of TIA; afib on Xarelto; moderate AS; PAD; hypothyroidism; HTN;  MS; HLD; and DM presenting with tibial fracture.  She is unable to provide history due to her dementia.    HPI per PA Harris: She has a past surgical  history right total hip arthroplasty with revision after spiral fracture around the prosthesis. Left total knee arthroplasty performed by [facility] orthopedics. Patient presents with left leg deformity. She states that she was getting up out of her recliner when her knees gave out and she fell to the floor. She has normal sensation and pulses in the left leg. She denies hitting her head or losing consciousness. Her daughter is at bedside and states that she lives in an assisted living facility and they recently had to hire a nurse to come in to help with medication changes due to poor short-term memory and some mild dementia.  She otherwise has a fairly good quality of life. The patient states that her pain is "not at screaming level right now." And is improved after IV pain medications. Complains of pain in her left leg and pain in her left groin  I spoke with her son [name]).  He reports that she has been receiving all of her medications packaged today through the pharmacy.  The family realized recently that the package did not include some of her pills and she had not been taking bisoprolol and another unspecified medication for at least a week or more.  The pharmacy provided the packaged medications and 2 "loose pills" starting last week.   Her son saw her last week and she was doing fairly well.  He thinks this fall might be related to resumption of bisoprolol.  In the last week, she has really gone downhill - he thinks it was related to this medication change.  As a result, the family called Dr. Elissa Hefty office and he  recommended stopping Cardizem instead of Bisoprolol - this has not happened yet.  At her 89th birthday party, they also discussed code status - but she would not commit.  Her sons decided together that she should be DNR.  After her prior hip replacement, post-anesthesia or with pain medication, she had serious hallucinations.  ED Course:   Mechanical fall today.  On Xarelto for afib, mild  dementia.  Fall on left side - periprosthetic femur fracture, left bimalleolar fracture.  [place] of [place].  Orthopedics consulted, possible ORIF for both this afternoon.  Hospital Course:  Principal Problem:   Supracondylar fracture of left femur, closed, initial encounter [facility]) Active Problems:   Permanent atrial fibrillation (HCC); CHA2DS2-VASc Score 6   Chronic anticoagulation   Dyslipidemia, goal LDL below 100 - due aortic stenosis   Essential hypertension -well controlled   Hypothyroidism   Trimalleolar fracture of ankle, closed, left, initial encounter   CKD (chronic kidney disease) stage 3, GFR 30-59 ml/min (HCC)   Hyperglycemia   Dementia   Left Hip and trimalleolar ankle fracture;  -Post op  8-29. S/P ORIF, left bimalleolar ankle fracture post ORIF.  -Schedule miralax for bowel regimen,  -Ok to resume xarelto.  -Pain management, avoid oversedation.  -she is to follow up with ortho Dr Jena Gauss  Acute blood loss anemia; post sx. Expected.  Hb stable at 9 snf md to continue monitor hgb   Permanent A fib;  Cardiology Dr Herbie Baltimore consulted, patient 's son concern regarding Cardizem and making patient sleepy and tired. Plan to hold Cardizem for now. Continue with bisoprolol -On  xarelto.  -HR controlled.  -f/u with Dr Herbie Baltimore   H/o aortic stenosis Followed by Dr Herbie Baltimore  HTN; monitor post op.  Home meds triamterene held in the hospital, discontinued at discharge, patient has poor appetite, prone to dehydation Home meds cardizem discontinued,  She is discharged on bisoprolol, bp stable at discharge.  Confusion; Delirium:  With possible baseline dementia Worsening confusion, weakness over last 3 week. Some changes in cognition for last 3 months.  B 12 normal. .  TSH normal.  Vitamin D level.  Per son , patient is more confuse. MRI ordered which was negative for stroke.  She is oriented to self only UA ordered with evidence of UTI. Urine culture  ordered. Started IV ceftriaxone 9-03.  UTI with acute urinary retention;  Develops urine retention 9-02, foley placed, she is started on flomax UA showed UTI. Urine culture + g-rods  She is treated with ceftriaxone, she is discharged on cefdinir ( per past culture) Follow urine culture.   Voiding trial at SNF   Stage I pressure injury:  left buttock,  Presented on admission Skin care, pressure off loading  FTT: poor appetite She is started on nutrition supplement, she is started on low dose remeron for appetite.    DVT prophylaxis: resume xarelto.  Code Status: DNR Family Communication: son over phone Disposition Plan:  rehab.   Consultants:   Ortho  Cardiology    Procedures:   as above   Antimicrobials:   As above   Discharge Exam: BP 124/73   Pulse 61   Temp 97.9 F (36.6 C) (Oral)   Resp 20   Ht 5\' 2"  (1.575 m)   Wt 75 kg   SpO2 98%   BMI 30.24 kg/m   General: alert, following commands, only oriented to self Cardiovascular: IRRR, + murmur Respiratory: CTABL Extremity: left lower extremity post op changes wrapped,  Discharge Instructions You were cared for by a hospitalist during your hospital stay. If you have any questions about your discharge medications or the care you received while you were in the hospital after you are discharged, you can call the unit and asked to speak with the hospitalist on call if the hospitalist that took care of you is not available. Once you are discharged, your primary care physician will handle any further medical issues. Please note that NO REFILLS for any discharge medications will be authorized once you are discharged, as it is imperative that you return to your primary care physician (or establish a relationship with a primary care physician if you do not have one) for your aftercare needs so that they can reassess your need for medications and monitor your lab values.  Discharge Instructions    Diet - low  sodium heart healthy   Complete by:  As directed    Increase activity slowly   Complete by:  As directed    Weightbearing: NWB RLE     Allergies as of 04/20/2018      Reactions   Tetanus Antitoxin Anaphylaxis   Throat swelling   Tramadol Other (See Comments)   HALLUCINATIONS; "and paranoia"   Augmentin [amoxicillin-pot Clavulanate]    UNSPECIFIED REACTION    Codeine    UNSPECIFIED REACTION    Livalo [pitavastatin]    UNSPECIFIED REACTION    Meclizine Other (See Comments)   Had funny feeling      Medication List    STOP taking these medications   CARTIA XT 120 MG 24 hr capsule Generic drug:  diltiazem   solifenacin 5 MG tablet Commonly known as:  VESICARE   triamterene-hydrochlorothiazide 37.5-25 MG tablet Commonly known as:  MAXZIDE-25     TAKE these medications   acetaminophen 325 MG tablet Commonly known as:  TYLENOL Take 2 tablets (650 mg total) by mouth every 6 (six) hours as needed for mild pain or moderate pain.   bisoprolol 10 MG tablet Commonly known as:  ZEBETA Take 1 tablet (10 mg total) by mouth daily. What changed:  additional instructions   cefdinir 300 MG capsule Commonly known as:  OMNICEF Take 1 capsule (300 mg total) by mouth every 12 (twelve) hours for 7 days.   cholecalciferol 1000 units tablet Commonly known as:  VITAMIN D Take 1,000 Units by mouth every morning.   Cranberry 500 MG Tabs Take 250 mg by mouth daily.   docusate sodium 100 MG capsule Commonly known as:  COLACE Take 1 capsule (100 mg total) by mouth 2 (two) times daily.   feeding supplement (ENSURE ENLIVE) Liqd Take 237 mLs by mouth 2 (two) times daily between meals.   iron polysaccharides 150 MG capsule Commonly known as:  NIFEREX Take 1 capsule (150 mg total) by mouth daily.   levothyroxine 125 MCG tablet Commonly known as:  SYNTHROID, LEVOTHROID Take 125 mcg by mouth daily before breakfast.   mirtazapine 7.5 MG tablet Commonly known as:  REMERON Take 1 tablet  (7.5 mg total) by mouth at bedtime.   polyethylene glycol packet Commonly known as:  MIRALAX / GLYCOLAX Take 17 g by mouth daily.   Rivaroxaban 15 MG Tabs tablet Commonly known as:  XARELTO Take 1 tablet (15 mg total) by mouth daily with breakfast. What changed:  when to take this   senna-docusate 8.6-50 MG tablet Commonly known as:  Senokot-S Take 1 tablet by mouth at bedtime.   tamsulosin 0.4 MG Caps capsule Commonly known  as:  FLOMAX Take 1 capsule (0.4 mg total) by mouth daily for 14 days. Start taking on:  04/21/2018   trimethoprim 100 MG tablet Commonly known as:  TRIMPEX Take 100 mg by mouth daily.      Allergies  Allergen Reactions  . Tetanus Antitoxin Anaphylaxis    Throat swelling  . Tramadol Other (See Comments)    HALLUCINATIONS; "and paranoia"  . Augmentin [Amoxicillin-Pot Clavulanate]     UNSPECIFIED REACTION   . Codeine     UNSPECIFIED REACTION   . Livalo [Pitavastatin]     UNSPECIFIED REACTION   . Meclizine Other (See Comments)    Had funny feeling    Contact information for follow-up providers    Haddix, Gillie Manners, MD Follow up in 1 week(s).   Specialty:  Orthopedic Surgery Why:  for x-rays and suture removal Contact information: [address] [place] [address] [phone]        Merri Brunette, MD Follow up.   Specialty:  Internal Medicine Contact information: [address] [place] [address] [place] [address] [phone]        Marykay Lex, MD Follow up in 4 week(s).   Specialty:  Cardiology Why:  afib/aortic stenosis Contact information: [address] [place] [address] [phone]            Contact information for after-discharge care    Destination    [facility] .   Service:  Skilled Nursing Contact information: [address] [place] [address] [phone]                   The results of significant diagnostics  from this hospitalization (including imaging, microbiology, ancillary and laboratory) are listed below for reference.    Significant Diagnostic Studies: Dg Chest 1 View  Result Date: 04/13/2018 CLINICAL DATA:  84 year old female with a history of fall and a left leg deformity EXAM: CHEST  1 VIEW COMPARISON:  03/11/2018, 06/27/2017 FINDINGS: Cardiomediastinal silhouette unchanged in size and contour with cardiomegaly. Right rotation somewhat accentuates the heart borders. Calcifications of the aortic arch. Low lung volumes. Coarsened interstitial markings, similar to prior. No confluent airspace disease, pneumothorax, or pleural effusion. Degenerative changes of the spine IMPRESSION: Chronic lung changes and likely atelectasis with no evidence of acute cardiopulmonary disease. Cardiomegaly Electronically Signed   By: Gilmer Mor D.O.   On: 04/13/2018 07:00   Dg Tibia/fibula Left  Result Date: 04/13/2018 CLINICAL DATA:  84 y/o F; fall this morning with left leg deformity. EXAM: LEFT TIBIA AND FIBULA - 2 VIEW; LEFT FEMUR 2 VIEWS; LEFT FOOT - COMPLETE 3+ VIEW COMPARISON:  01/03/2014 CT of the left knee. FINDINGS: Left femur: Acute comminuted transverse fracture of the lower femoral diaphysis just above the knee prosthesis with 1 shaft's width posterior displacement of the distal fracture component. The hip joint appears well maintained. Left tibia and fibula: Left distal femur fracture as above. Total knee prosthesis without periprosthetic lucency. Chronic fracture deformity of the patella. Vascular calcifications noted. Acute minimally displaced fracture of the medial malleolus. Acute oblique minimally displaced fracture of the lower fibula above level of tibial plafond. No ankle joint dislocation identified on these nonstress views. Possible minimally displaced fracture of the posterior malleolus. Left foot: There is no evidence of fracture or other focal bone lesions the foot. Soft tissues are  unremarkable. IMPRESSION: 1. Acute comminuted transverse fracture of the left lower femur diaphysis just above the knee  prosthesis. One shaft's width posterior displacement of distal fracture component. 2. Acute minimally displaced fracture of left medial malleolus. 3. Acute minimally displaced fracture of the left lower fibula diaphysis above level of tibial plafond. 4. Possible minimally displaced acute fracture of posterior malleolus. 5. Chronic fracture deformity of the left patella. Electronically Signed   By: Mitzi Hansen M.D.   On: 04/13/2018 06:46   Dg Ankle 2 Views Left  Result Date: 04/14/2018 CLINICAL DATA:  ORIF Left Ankle EXAM: LEFT ANKLE - 2 VIEW COMPARISON:  Plain films dated 04/13/2018. FINDINGS: Six intraoperative fluoroscopic images are provided. Final images show plate and screw fixation hardware of the distal tibia and fibula. Hardware appears intact and appropriately positioned. Osseous alignment is significantly improved compared to earlier plain films. Fluoroscopy was provided for 73 seconds. IMPRESSION: Intraoperative films demonstrating plate and screw fixation hardware of the distal LEFT tibia and fibula. No evidence of surgical complicating feature. Electronically Signed   By: Bary Richard M.D.   On: 04/14/2018 13:49   Dg Ankle 2 Views Left  Result Date: 04/13/2018 CLINICAL DATA:  Recent fall with left ankle pain and deformity, initial encounter EXAM: LEFT ANKLE - 2 VIEW COMPARISON:  Films from earlier in the same day. FINDINGS: Oblique fracture through the distal fibular metaphysis is noted. Additionally a transverse fracture through the medial malleolus is seen. Slight angulation laterally at the fracture site is noted. Soft tissue swelling is seen. No other fracture is identified. IMPRESSION: Distal tibial and fibular fractures as described. No definitive posterior malleolar fracture is seen. Electronically Signed   By: Alcide Clever M.D.   On: 04/13/2018 08:16    Dg Ankle Complete Left  Result Date: 04/14/2018 CLINICAL DATA:  S/p ORIF left ankle EXAM: LEFT ANKLE COMPLETE - 3+ VIEW COMPARISON:  Plain films of the LEFT ankle dated 04/13/2018. FINDINGS: Interval plate and screw fixation hardware of the distal LEFT tibia and fibula. Hardware appears intact and appropriately positioned. Osseous alignment is significantly improved. Ankle mortise is now symmetric. Overlying casting in place. IMPRESSION: Significantly improved alignment of the distal LEFT tibia and fibula status post plate and screw fixation. Electronically Signed   By: Bary Richard M.D.   On: 04/14/2018 13:54   Ct Head Wo Contrast  Result Date: 04/13/2018 CLINICAL DATA:  Recent fall with head injury EXAM: CT HEAD WITHOUT CONTRAST CT CERVICAL SPINE WITHOUT CONTRAST TECHNIQUE: Multidetector CT imaging of the head and cervical spine was performed following the standard protocol without intravenous contrast. Multiplanar CT image reconstructions of the cervical spine were also generated. COMPARISON:  03/11/2018 FINDINGS: CT HEAD FINDINGS Brain: Chronic atrophic changes and chronic white matter ischemic changes again noted and stable. No findings to suggest acute hemorrhage, acute infarction space-occupying lesion seen. Vascular: No hyperdense vessel or unexpected calcification. Skull: Normal. Negative for fracture or focal lesion. Sinuses/Orbits: No acute finding. Other: None. CT CERVICAL SPINE FINDINGS Alignment: Within normal limits. Skull base and vertebrae: 7 cervical segments are well visualized. Vertebral body height is well maintained. Multilevel disc space narrowing is noted from C3-C7. Mild osteophytic changes and facet hypertrophic changes are noted. No findings to suggest acute fracture or facet abnormality seen. The odontoid is within normal limits. Multilevel neural foraminal narrowing is noted bilaterally. Soft tissues and spinal canal: Diffuse vascular calcifications are noted. No acute soft  tissue abnormality is noted. Upper chest: Within normal limits Other: None IMPRESSION: CT of the head: Chronic atrophic and ischemic changes without acute abnormality. CT of the cervical spine: Multilevel  degenerative change without acute abnormality. Electronically Signed   By: Alcide Clever M.D.   On: 04/13/2018 07:58   Ct Cervical Spine Wo Contrast  Result Date: 04/13/2018 CLINICAL DATA:  Recent fall with head injury EXAM: CT HEAD WITHOUT CONTRAST CT CERVICAL SPINE WITHOUT CONTRAST TECHNIQUE: Multidetector CT imaging of the head and cervical spine was performed following the standard protocol without intravenous contrast. Multiplanar CT image reconstructions of the cervical spine were also generated. COMPARISON:  03/11/2018 FINDINGS: CT HEAD FINDINGS Brain: Chronic atrophic changes and chronic white matter ischemic changes again noted and stable. No findings to suggest acute hemorrhage, acute infarction space-occupying lesion seen. Vascular: No hyperdense vessel or unexpected calcification. Skull: Normal. Negative for fracture or focal lesion. Sinuses/Orbits: No acute finding. Other: None. CT CERVICAL SPINE FINDINGS Alignment: Within normal limits. Skull base and vertebrae: 7 cervical segments are well visualized. Vertebral body height is well maintained. Multilevel disc space narrowing is noted from C3-C7. Mild osteophytic changes and facet hypertrophic changes are noted. No findings to suggest acute fracture or facet abnormality seen. The odontoid is within normal limits. Multilevel neural foraminal narrowing is noted bilaterally. Soft tissues and spinal canal: Diffuse vascular calcifications are noted. No acute soft tissue abnormality is noted. Upper chest: Within normal limits Other: None IMPRESSION: CT of the head: Chronic atrophic and ischemic changes without acute abnormality. CT of the cervical spine: Multilevel degenerative change without acute abnormality. Electronically Signed   By: Alcide Clever  M.D.   On: 04/13/2018 07:58   Mr Brain Wo Contrast  Result Date: 04/19/2018 CLINICAL DATA:  Confusion. EXAM: MRI HEAD WITHOUT CONTRAST TECHNIQUE: Multiplanar, multiecho pulse sequences of the brain and surrounding structures were obtained without intravenous contrast. COMPARISON:  Head CT 04/13/2018 and MRI 03/02/2016 FINDINGS: Brain: There is no evidence of acute infarct, mass, midline shift, or extra-axial fluid collection. Chronic microhemorrhages are again seen in the thalami. There is also a chronic microhemorrhage in the pons which is new from the prior MRI. Patchy T2 hyperintensities in the cerebral white matter and pons are unchanged from the prior MRI and nonspecific but compatible with moderate chronic small vessel ischemic disease. Numerous dilated perivascular spaces are again seen throughout the basal ganglia bilaterally. There also appear to be chronic lacunar infarcts along the posterior and superior aspects of the right lentiform nucleus. Small chronic infarcts are also present in the cerebellum bilaterally. There is moderate cerebral atrophy. Vascular: Major intracranial vascular flow voids are preserved. Skull and upper cervical spine: Unremarkable bone marrow signal. Sinuses/Orbits: Bilateral cataract extraction. Paranasal sinuses and mastoid air cells are clear. Other: None. IMPRESSION: 1. No acute intracranial abnormality. 2. Moderate chronic small vessel ischemic disease and cerebral atrophy. Electronically Signed   By: Sebastian Ache M.D.   On: 04/19/2018 12:42   Dg Foot Complete Left  Result Date: 04/13/2018 CLINICAL DATA:  84 y/o F; fall this morning with left leg deformity. EXAM: LEFT TIBIA AND FIBULA - 2 VIEW; LEFT FEMUR 2 VIEWS; LEFT FOOT - COMPLETE 3+ VIEW COMPARISON:  01/03/2014 CT of the left knee. FINDINGS: Left femur: Acute comminuted transverse fracture of the lower femoral diaphysis just above the knee prosthesis with 1 shaft's width posterior displacement of the distal  fracture component. The hip joint appears well maintained. Left tibia and fibula: Left distal femur fracture as above. Total knee prosthesis without periprosthetic lucency. Chronic fracture deformity of the patella. Vascular calcifications noted. Acute minimally displaced fracture of the medial malleolus. Acute oblique minimally displaced fracture of the lower  fibula above level of tibial plafond. No ankle joint dislocation identified on these nonstress views. Possible minimally displaced fracture of the posterior malleolus. Left foot: There is no evidence of fracture or other focal bone lesions the foot. Soft tissues are unremarkable. IMPRESSION: 1. Acute comminuted transverse fracture of the left lower femur diaphysis just above the knee prosthesis. One shaft's width posterior displacement of distal fracture component. 2. Acute minimally displaced fracture of left medial malleolus. 3. Acute minimally displaced fracture of the left lower fibula diaphysis above level of tibial plafond. 4. Possible minimally displaced acute fracture of posterior malleolus. 5. Chronic fracture deformity of the left patella. Electronically Signed   By: Mitzi Hansen M.D.   On: 04/13/2018 06:46   Dg C-arm 1-60 Min  Result Date: 04/14/2018 CLINICAL DATA:  ORIF DISTAL LEFT FEMUR EXAM: DG C-ARM 61-120 MIN COMPARISON:  None. FINDINGS: Intraoperative fluoroscopic images demonstrating plate and screw fixation hardware of the LEFT femur fracture. Final images demonstrated significantly improved osseous alignment. Fluoroscopy was provided for 87 seconds. IMPRESSION: Intraoperative fluoroscopic images demonstrating plate and screw fixation hardware of the LEFT femur. Fluoroscopy provided for the procedure. Electronically Signed   By: Bary Richard M.D.   On: 04/14/2018 13:53   Dg C-arm 1-60 Min  Result Date: 04/14/2018 CLINICAL DATA:  ORIF Left Ankle EXAM: DG C-ARM 61-120 MIN COMPARISON:  Plain films of the LEFT ankle dated  04/13/2018. FINDINGS: Intraoperative fluoroscopic images demonstrating plate and screw fixation hardware of the distal LEFT tibia and fibula. Hardware appears intact and appropriately positioned. Osseous alignment is improved compared to earlier plain films. Fluoroscopy was provided for 73 seconds. IMPRESSION: Intraoperative images demonstrating plate and screw fixation hardware of the distal LEFT tibia and fibula. Fluoroscopy provided for the procedure. Electronically Signed   By: Bary Richard M.D.   On: 04/14/2018 13:50   Dg Femur Min 2 Views Left  Result Date: 04/14/2018 CLINICAL DATA:  ORIF DISTAL LEFT FEMUR EXAM: LEFT FEMUR 2 VIEWS COMPARISON:  Plain films of the femur dated 04/13/2018. FINDINGS: Six intraoperative fluoroscopic images are provided of the LEFT femur. Final images demonstrate plate and screw fixation of the LEFT femur, traversing the displaced/comminuted fracture within the distal LEFT femur. Hardware appears intact and appropriately positioned. Fluoroscopy was provided for 87 seconds. IMPRESSION: Intraoperative images demonstrating plate and screw fixation of the LEFT femur. Hardware appears intact and appropriately positioned. No evidence of surgical complicating feature. Electronically Signed   By: Bary Richard M.D.   On: 04/14/2018 13:52   Dg Femur Min 2 Views Left  Result Date: 04/13/2018 CLINICAL DATA:  84 y/o F; fall this morning with left leg deformity. EXAM: LEFT TIBIA AND FIBULA - 2 VIEW; LEFT FEMUR 2 VIEWS; LEFT FOOT - COMPLETE 3+ VIEW COMPARISON:  01/03/2014 CT of the left knee. FINDINGS: Left femur: Acute comminuted transverse fracture of the lower femoral diaphysis just above the knee prosthesis with 1 shaft's width posterior displacement of the distal fracture component. The hip joint appears well maintained. Left tibia and fibula: Left distal femur fracture as above. Total knee prosthesis without periprosthetic lucency. Chronic fracture deformity of the patella. Vascular  calcifications noted. Acute minimally displaced fracture of the medial malleolus. Acute oblique minimally displaced fracture of the lower fibula above level of tibial plafond. No ankle joint dislocation identified on these nonstress views. Possible minimally displaced fracture of the posterior malleolus. Left foot: There is no evidence of fracture or other focal bone lesions the foot. Soft tissues are unremarkable. IMPRESSION: 1.  Acute comminuted transverse fracture of the left lower femur diaphysis just above the knee prosthesis. One shaft's width posterior displacement of distal fracture component. 2. Acute minimally displaced fracture of left medial malleolus. 3. Acute minimally displaced fracture of the left lower fibula diaphysis above level of tibial plafond. 4. Possible minimally displaced acute fracture of posterior malleolus. 5. Chronic fracture deformity of the left patella. Electronically Signed   By: Mitzi Hansen M.D.   On: 04/13/2018 06:46   Dg Femur Port Min 2 Views Left  Result Date: 04/14/2018 CLINICAL DATA:  S/P ORIF left femur EXAM: LEFT FEMUR PORTABLE 2 VIEWS COMPARISON:  Plain films dated 04/13/2018. FINDINGS: Status post plate and screw fixation of the LEFT femur, traversing the displaced/comminuted fracture within the distal LEFT femur. Hardware appears intact and appropriately positioned. Osseous alignment is significantly improved. No evidence of surgical complicating feature. IMPRESSION: Status post plate and screw fixation of the LEFT femur. Significantly improved osseous alignment. No evidence of surgical complicating feature. Electronically Signed   By: Bary Richard M.D.   On: 04/14/2018 13:56   Dg Hips Bilat W Or Wo Pelvis 3-4 Views  Result Date: 04/13/2018 CLINICAL DATA:  Recent fall with bilateral hip pain left greater than right EXAM: DG HIP (WITH OR WITHOUT PELVIS) 3-4V BILAT COMPARISON:  03/11/2018 FINDINGS: Prior right hip replacement is again identified and  stable. The pelvic ring is intact. No acute fracture or dislocation is noted. No soft tissue changes are seen. IMPRESSION: No acute abnormality noted. Electronically Signed   By: Alcide Clever M.D.   On: 04/13/2018 08:14    Microbiology: Recent Results (from the past 240 hour(s))  MRSA PCR Screening     Status: None   Collection Time: 04/13/18  6:34 PM  Result Value Ref Range Status   MRSA by PCR NEGATIVE NEGATIVE Final    Comment:        The GeneXpert MRSA Assay (FDA approved for NASAL specimens only), is one component of a comprehensive MRSA colonization surveillance program. It is not intended to diagnose MRSA infection nor to guide or monitor treatment for MRSA infections. Performed at [facility], [address] [place], [place] [address]   Urine Culture     Status: Abnormal (Preliminary result)   Collection Time: 04/19/18  5:27 AM  Result Value Ref Range Status   Specimen Description URINE, CATHETERIZED  Final   Special Requests   Final    NONE Performed at [facility], [address], [place], [place] [address]    Culture >=100,000 COLONIES/mL GRAM NEGATIVE RODS (A)  Final   Report Status PENDING  Incomplete     Labs: Basic Metabolic Panel: Recent Labs  Lab 04/15/18 0447 04/17/18 0955 04/19/18 0357 04/20/18 0415  NA 140 142 143 142  K 3.9 4.1 4.5 4.6  CL 105 108 110 109  CO2 28 28 27 26   GLUCOSE 142* 113* 122* 92  BUN 16 23 19 16   CREATININE 1.00 0.92 0.78 0.81  CALCIUM 8.7* 8.0* 8.4* 8.3*  MG  --  2.1  --   --    Liver Function Tests: No results for input(s): AST, ALT, ALKPHOS, BILITOT, PROT, ALBUMIN in the last 168 hours. No results for input(s): LIPASE, AMYLASE in the last 168 hours. Recent Labs  Lab 04/18/18 1507  AMMONIA 16   CBC: Recent Labs  Lab 04/15/18 0447 04/16/18 0526 04/17/18 0805 04/18/18 1507 04/19/18 0357 04/20/18 0415  WBC 12.1* 9.9  --  8.0  7.5 8.0  HGB 10.7* 9.6* 9.4* 10.4* 9.6* 9.2*  HCT 33.1* 30.0* 29.9*  33.4* 30.3* 29.4*  MCV 94.3 96.5  --  98.2 96.8 98.3  PLT 225 204  --  274 260 269   Cardiac Enzymes: No results for input(s): CKTOTAL, CKMB, CKMBINDEX, TROPONINI in the last 168 hours. BNP: BNP (last 3 results) No results for input(s): BNP in the last 8760 hours.  ProBNP (last 3 results) No results for input(s): PROBNP in the last 8760 hours.  CBG: Recent Labs  Lab 04/14/18 1015  GLUCAP 149*       Signed:  Albertine Grates MD, PhD  [facility] 04/20/2018, 12:51 PM

## 2018-04-20 NOTE — Progress Notes (Signed)
IV line discontinued per discharge order. Pt tolerated well. No complications observed to site.

## 2018-04-20 NOTE — Progress Notes (Signed)
Report called to nurse Toni Arthurs at [facility]. Made aware that xarelto dose is due this evening and to change schedule in the am.

## 2018-04-20 NOTE — Plan of Care (Signed)
  Problem: Clinical Measurements: Goal: Will remain free from infection Outcome: Progressing   Problem: Elimination: Goal: Will not experience complications related to bowel motility Outcome: Progressing Goal: Will not experience complications related to urinary retention Outcome: Progressing   Problem: Skin Integrity: Goal: Risk for impaired skin integrity will decrease Outcome: Progressing   

## 2018-04-20 NOTE — Progress Notes (Signed)
Pt will DC to: [facility]  DC date: 04/20/2018 Family notified: Steve(son) Transport [id number]  RN, patient, and facility notified of DC. Discharge Summary sent to facility. RN given number for report [phone] room 7010. DC packet on chart. Ambulance transport requested for patient.   CSW signing off.  Antony Blackbird, [facility] Clinical Social Worker [phone]

## 2018-04-22 DIAGNOSIS — S72302D Unspecified fracture of shaft of left femur, subsequent encounter for closed fracture with routine healing: Secondary | ICD-10-CM | POA: Diagnosis not present

## 2018-04-22 DIAGNOSIS — I4891 Unspecified atrial fibrillation: Secondary | ICD-10-CM | POA: Diagnosis not present

## 2018-04-22 DIAGNOSIS — F0390 Unspecified dementia without behavioral disturbance: Secondary | ICD-10-CM | POA: Diagnosis not present

## 2018-04-22 DIAGNOSIS — D509 Iron deficiency anemia, unspecified: Secondary | ICD-10-CM | POA: Diagnosis not present

## 2018-04-22 LAB — URINE CULTURE: Culture: >=100000 — AB

## 2018-04-26 DIAGNOSIS — S72452D Displaced supracondylar fracture without intracondylar extension of lower end of left femur, subsequent encounter for closed fracture with routine healing: Secondary | ICD-10-CM | POA: Diagnosis not present

## 2018-04-26 DIAGNOSIS — S82852D Displaced trimalleolar fracture of left lower leg, subsequent encounter for closed fracture with routine healing: Secondary | ICD-10-CM | POA: Diagnosis not present

## 2018-05-17 DIAGNOSIS — S93432D Sprain of tibiofibular ligament of left ankle, subsequent encounter: Secondary | ICD-10-CM | POA: Diagnosis not present

## 2018-05-17 DIAGNOSIS — S72452D Displaced supracondylar fracture without intracondylar extension of lower end of left femur, subsequent encounter for closed fracture with routine healing: Secondary | ICD-10-CM | POA: Diagnosis not present

## 2018-05-17 DIAGNOSIS — S82852D Displaced trimalleolar fracture of left lower leg, subsequent encounter for closed fracture with routine healing: Secondary | ICD-10-CM | POA: Diagnosis not present

## 2018-06-08 ENCOUNTER — Encounter: Payer: Self-pay | Admitting: Cardiology

## 2018-06-08 ENCOUNTER — Ambulatory Visit (INDEPENDENT_AMBULATORY_CARE_PROVIDER_SITE_OTHER): Payer: Medicare Other | Admitting: Cardiology

## 2018-06-08 VITALS — BP 128/72 | HR 66 | Ht 62.0 in | Wt 153.0 lb

## 2018-06-08 DIAGNOSIS — E785 Hyperlipidemia, unspecified: Secondary | ICD-10-CM

## 2018-06-08 DIAGNOSIS — I10 Essential (primary) hypertension: Secondary | ICD-10-CM | POA: Diagnosis not present

## 2018-06-08 DIAGNOSIS — I350 Nonrheumatic aortic (valve) stenosis: Secondary | ICD-10-CM | POA: Diagnosis not present

## 2018-06-08 DIAGNOSIS — I4821 Permanent atrial fibrillation: Secondary | ICD-10-CM | POA: Diagnosis not present

## 2018-06-08 NOTE — Progress Notes (Signed)
PCP: Deland Pretty, MD  Clinic Note: Chief Complaint  Patient presents with  . Hospitalization Follow-up    Early for original appointment because of recent hospitalization with leg fracture and medication changes.  They want to confirm that medicines do not need to be restarted.    HPI: Jamilett Ferrante is a 84 y.o. female with a PMH below who presents today for 5 month follow-up for Chronic Persistent AFib & Mild-Mod AS.[name]  Charis Juliana was last seen in May 2019.  Doing relatively well without any major complaints.  Recent Hospitalizations:   8/28 - L Femur Fxr & TIb-Fib - ORIF  Studies Personally Reviewed - (if available, images/films reviewed: From Epic Chart or Care Everywhere)  No recent studies  She is accompanied today by 1 of her sons and his wife.  Interval History: Bronda returns here today still in wheelchair with leg in a brace/cast recovering from her fall with leg fracture.  She is currently living in the rehab center at [facility] with plans to move into the assisted living section in about a month or so.  Basically she fell because she got up at 4 in the morning walked down to the cafeteria realize it was too early for breakfast and turned around to walk back without her routine break.  When she got home the thought was that she just was so tired that she lost her balance and fell.  She does not remember any loss of consciousness just remembers that she gave out walking up. Apparently some of her medications were stopped including Cartia and Maxide.  Actually since doing that her blood pressures have been pretty stable in the systolic range of 324 to 401 mmHg.  Heart rates been in the 60s to 80s with one episode of 108. About the only bad thing with her current condition is that she has not really been eating as well as she wanted to because she states the food is not very good.  She is lost about 10 pounds.  Says she just does not have an appetite when the food is not  good. She was quick to note that she did not have any major symptoms of the time of her fall.  Did not feel any irregular rapid heartbeats.  Denies any loss of consciousness..  Interestingly, the episode that happened seem to stand more along the fact that she had not been sleeping very well, waking up multiple times during the night and then sleeping during the day.  She had some medication adjustments and now is sleeping much better.  She previously was not aware of the date and time during the day.  She was just confused and often lethargic.  Now she is wide awake and and well aware was going on.  The general consensus was that her fall was associated with early morning walking when she probably should have been walking. She still has some edema but nothing significant.  Has not noticed a change since being off the Maxide.  Not using Lasix. She has not noted any rapid irregular heartbeats palpitations either.  Syncope/near syncope or TIA shows amorous fugax.  No claudication.  No PND, orthopnea.  ROS: A comprehensive was performed. Review of Systems  Constitutional: Positive for weight loss. Negative for chills, fever and malaise/fatigue.  HENT: Negative for congestion and nosebleeds.   Respiratory: Positive for shortness of breath (If she overdoes it).   Cardiovascular: Negative for leg swelling (Trivial).  Gastrointestinal: Positive for constipation. Negative for  blood in stool and diarrhea.  Genitourinary: Negative for hematuria.  Musculoskeletal: Positive for falls (No falls since her fracture back in late July/August) and joint pain. Negative for myalgias.  Neurological: Positive for dizziness (Sometimes positional). Negative for focal weakness and weakness.  Endo/Heme/Allergies: Does not bruise/bleed easily.  Psychiatric/Behavioral: Negative.  Negative for memory loss.  All other systems reviewed and are negative.   I have reviewed and (if needed) personally updated the patient's  problem list, medications, allergies, past medical and surgical history, social and family history.   Past Medical History:  Diagnosis Date  . Anemia   . Aortic stenosis, moderate (was mild) 08/2009; 08/2014   a. ECHO  EF 55-60%, wth increased EDP, mild AS;; b. mild Conc LVH, EF 60-65%, DD with high LVEDP, MIld-Mod AS, Mod MAC with Mod MR, mild-mod PA HTN.  [name] Complication of anesthesia    hallusinations  . Constipation   . Diabetes mellitus without complication ([place])    possibly  . Dyslipidemia    treated  . H/O cardiovascular stress test 05/2010   Lexiscan cardiolite no ischemia or infarction  . H/O multiple sclerosis    no excaerbations in some time  . History of ETT 02/2011   [place] exercise protocol, negative with poor exercise tolerance  . History of transfusion   . Hypertension   . Hypothyroidism   . Lower extremity edema    chronic  . Osteoarthritis    "generalized" (04/13/2018)  . PAD (peripheral artery disease) ([place]) 08/30/2009   Dopplers; with bil. post. tib artery occlusion  . Permanent atrial fibrillation    on Xarelto  . Stroke [facility]) ~2011/2012   "memory issues since" (04/13/2018)  . Transient ischemic attack (TIA) "she's had several"   "memory issues since" (04/13/2018)  . Vaginal itching   . Visual disturbances    "FLASHING IN MY EYES"    Past Surgical History:  Procedure Laterality Date  . BREAST SURGERY     L tumor removed - benign  . CATARACT EXTRACTION, BILATERAL Bilateral   . CESAREAN SECTION  1952; 1956; 1959  . CHOLECYSTECTOMY    . JOINT REPLACEMENT    . ORIF ANKLE FRACTURE Left 04/14/2018   Procedure: OPEN REDUCTION INTERNAL FIXATION (ORIF) ANKLE FRACTURE;  Surgeon: Shona Needles, MD;  Location: [place];  Service: Orthopedics;  Laterality: Left;  . ORIF FEMUR FRACTURE Left 04/14/2018   Procedure: OPEN REDUCTION INTERNAL FIXATION (ORIF) DISTAL FEMUR FRACTURE;  Surgeon: Shona Needles, MD;  Location: [place];  Service: Orthopedics;  Laterality: Left;   . ORIF PERIPROSTHETIC FRACTURE Right 02/07/2015   Procedure: OPEN REDUCTION INTERNAL FIXATION (ORIF) PERIPROSTHETIC FRACTURE WITH TOTAL HIP REVISION AND CABLES;  Surgeon: Paralee Cancel, MD;  Location: [facility];  Service: Orthopedics;  Laterality: Right;  . THYROIDECTOMY, PARTIAL Left   . TOTAL HIP ARTHROPLASTY Right 01/29/2015   Procedure: RIGHT TOTAL HIP ARTHROPLASTY ANTERIOR APPROACH;  Surgeon: Paralee Cancel, MD;  Location: [facility];  Service: Orthopedics;  Laterality: Right;  . TOTAL KNEE ARTHROPLASTY Left 2012    Current Meds  Medication Sig  . acetaminophen (TYLENOL) 325 MG tablet Take 2 tablets (650 mg total) by mouth every 6 (six) hours as needed for mild pain or moderate pain.  . bisoprolol (ZEBETA) 10 MG tablet Take 1 tablet (10 mg total) by mouth daily.  . cholecalciferol (VITAMIN D) 1000 UNITS tablet Take 1,000 Units by mouth every morning.   . Cranberry 500 MG TABS Take 250 mg by mouth daily.   [name]  docusate sodium (COLACE) 100 MG capsule Take 1 capsule (100 mg total) by mouth 2 (two) times daily.  . feeding supplement, ENSURE ENLIVE, (ENSURE ENLIVE) LIQD Take 237 mLs by mouth 2 (two) times daily between meals.  . iron polysaccharides (NIFEREX) 150 MG capsule Take 1 capsule (150 mg total) by mouth daily.  [name] levothyroxine (SYNTHROID, LEVOTHROID) 125 MCG tablet Take 125 mcg by mouth daily before breakfast.  . polyethylene glycol (MIRALAX / GLYCOLAX) packet Take 17 g by mouth daily.  . Rivaroxaban (XARELTO) 15 MG TABS tablet Take 1 tablet (15 mg total) by mouth daily with breakfast. (Patient taking differently: Take 15 mg by mouth daily with supper. )  . trimethoprim (TRIMPEX) 100 MG tablet Take 100 mg by mouth daily.     Allergies  Allergen Reactions  . Tetanus Antitoxin Anaphylaxis    Throat swelling  . Tramadol Other (See Comments)    HALLUCINATIONS; "and paranoia"  . Augmentin [Amoxicillin-Pot Clavulanate]     UNSPECIFIED REACTION   . Codeine     UNSPECIFIED REACTION   . Livalo  [Pitavastatin]     UNSPECIFIED REACTION   . Meclizine Other (See Comments)    Had funny feeling    Social History   Tobacco Use  . Smoking status: Never Smoker  . Smokeless tobacco: Never Used  Substance Use Topics  . Alcohol use: No    Alcohol/week: 0.0 standard drinks  . Drug use: No    Types: Fentanyl   Social History   Social History Narrative   Uses a walker to walk   Drinks 1-2 cups of coffee a day     family history includes Heart failure (age of onset: 12) in her father; Lung cancer in her father.  Wt Readings from Last 3 Encounters:  06/08/18 153 lb (69.4 kg)  04/13/18 165 lb 5.5 oz (75 kg)  03/11/18 164 lb (74.4 kg)    PHYSICAL EXAM BP 128/72   Pulse 66   Ht _0  (1.575 m)   Wt 153 lb (69.4 kg) Comment: caregiver reported, unable to obtain  SpO2 96%   BMI 27.98 kg/m  Physical Exam  Constitutional: She is oriented to person, place, and time. She appears well-developed and well-nourished. No distress.  For the first time she actually does appear more her stated age.  She is in a wheelchair.  Left leg somewhat immobilized.  HENT:  Head: Normocephalic and atraumatic.  Neck: Normal range of motion. Neck supple. No hepatojugular reflux and no JVD present. Carotid bruit is not present.  Cardiovascular: S1 normal, S2 normal, intact distal pulses and normal pulses. An irregularly irregular rhythm present. Bradycardia present. PMI is not displaced. Exam reveals no gallop and no friction rub.  Murmur heard.  Harsh crescendo-decrescendo midsystolic murmur is present with a grade of 1/6 at the upper right sternal border radiating to the neck. Pulmonary/Chest: Effort normal and breath sounds normal. No respiratory distress. She has no wheezes. She has no rales.  Abdominal: Soft. Bowel sounds are normal. She exhibits no distension. There is no tenderness.  No HSM  Musculoskeletal: Normal range of motion. She exhibits edema (Trivial).  Her left leg is in a large  immobilizing brace/cast.  She is in a wheelchair.  Unable to really assess the leg for any edema.  Neurological: She is alert and oriented to person, place, and time.  Psychiatric: She has a normal mood and affect. Her behavior is normal. Judgment and thought content normal.  Vitals reviewed.  Adult ECG Report  Rate: 66;  Rhythm: atrial fibrillation and Normal ventricular rate.  Otherwise normal axis, intervals and durations.;   Narrative Interpretation: Stable EKG   Other studies Reviewed: Additional studies/ records that were reviewed today include:  Recent Labs - April 27, 2018: (Will be scanned in)  NA 142, K4.0, CL 107, CO2 25, BUN 16, CR 0.94, glu 137, CA 8.9.  CBC W8.6, H/H 11.5/34.7, PLT 431.  Lipids from August 2019: TC 212, TG 111, LDL 143, HDL 47.  ASSESSMENT / PLAN: Problem List Items Addressed This Visit    Dyslipidemia, goal LDL below 100 - due aortic stenosis (Chronic)    Most recent lipids from August showed that her LDL is 143.  I suspect that is probably better with her having lost 9 pounds.  At this point I do not see the benefit of being aggressive with management.  Would hold off on statin for now.      Essential hypertension -well controlled (Chronic)    Blood pressures look fine.  I would be fine with some mild permissive hypertension as opposed to hypotension.  Therefore I am fine with her not being on the Cartia or triamterene-HCTZ.      Moderate aortic stenosis (Chronic)    Plan was to check an echocardiogram this summer, but since she is not having any real symptoms and the murmur does not seem to be any worse, I think were fine waiting to discuss it next year. I do not think that they will be all that interested in pursuing any treatment for her aortic valve tolerate to worsen, but we can discuss in routine follow-up next May. We did discuss concerning possible symptoms of heart failure type symptoms, syncope or near syncope and angina.       Permanent atrial fibrillation ([place]); CHA2DS2-VASc Score 6 - Primary (Chronic)    Remains rate controlled.  No sensation of irregular heartbeats.  Continue current dose of Zebeta. Since she has not had any breakthrough tachycardia episodes with not being on Cartia, I do not see any reason for restarting. She remains on low-dose Xarelto.  No bleeding.  We talked about concern for falls and bleeding, but since she did okay with the last fall I think they are fine with continuing Xarelto.      Relevant Orders   EKG 12-Lead (Completed)      I spent a total of 25 minutes with the patient and chart review. >  50% of the time was spent in direct patient consultation.   Current medicines are reviewed at length with the patient today.  (+/- concerns) n/a The following changes have been made:  n/a  Patient Instructions  Medication Instructions:  NO CHANGE WITH CURRENT MEDICATION  SEE ACCOMPANYING  PHYSICIAN VISIT FORM If you need a refill on your cardiac medications before your next appointment, please call your pharmacy.   Lab work: NOT NEEED If you have labs (blood work) drawn today and your tests are completely normal, you will receive your results only by: [name] MyChart Message (if you have MyChart) OR . A paper copy in the mail If you have any lab test that is abnormal or we need to change your treatment, we will call you to review the results.  Testing/Procedures: NOT NEEDED  CANCELLED  UPCOMING RECALL FOR ECHO. WILL RESCHEDULE AT NEXT APPOINTMENT  Follow-Up: At [facility], you and your health needs are our priority.  As part of our continuing mission to provide you with  exceptional heart care, we have created designated Provider Care Teams.  These Care Teams include your primary Cardiologist (physician) and Advanced Practice Providers (APPs -  Physician Assistants and Nurse Practitioners) who all work together to provide you with the care you need, when you need it. You will need a  follow up appointment in 7 months MAY 2020.  Please call our office 2 months in advance to schedule this appointment.  You may see Glenetta Hew, MD or one of the following Advanced Practice Providers on your designated Care Team:   Rosaria Ferries, PA-C . Jory Sims, DNP, ANP  Any Other Special Instructions Will Be Listed Below (If Applicable). LIBERALIZE DIET  TO GAIN WEIGHT SEE ACCOMPANYING  PHYSICIAN VISIT FORM    Studies Ordered:   Orders Placed This Encounter  Procedures  . EKG 12-Lead      Glenetta Hew, M.D., M.S. Interventional Cardiologist   Pager # [phone] Phone # [phone] [address] [place], [place] [address]   Thank you for choosing Heartcare at [facility]!!

## 2018-06-08 NOTE — Patient Instructions (Addendum)
Medication Instructions:  NO CHANGE WITH CURRENT MEDICATION  SEE ACCOMPANYING  PHYSICIAN VISIT FORM If you need a refill on your cardiac medications before your next appointment, please call your pharmacy.   Lab work: NOT NEEED If you have labs (blood work) drawn today and your tests are completely normal, you will receive your results only by: [name] MyChart Message (if you have MyChart) OR . A paper copy in the mail If you have any lab test that is abnormal or we need to change your treatment, we will call you to review the results.  Testing/Procedures: NOT NEEDED  CANCELLED  UPCOMING RECALL FOR ECHO. WILL RESCHEDULE AT NEXT APPOINTMENT  Follow-Up: At [facility], you and your health needs are our priority.  As part of our continuing mission to provide you with exceptional heart care, we have created designated Provider Care Teams.  These Care Teams include your primary Cardiologist (physician) and Advanced Practice Providers (APPs -  Physician Assistants and Nurse Practitioners) who all work together to provide you with the care you need, when you need it. You will need a follow up appointment in 7 months MAY 2020.  Please call our office 2 months in advance to schedule this appointment.  You may see Bryan Lemma, MD or one of the following Advanced Practice Providers on your designated Care Team:   Theodore Demark, PA-C . Joni Reining, DNP, ANP  Any Other Special Instructions Will Be Listed Below (If Applicable). LIBERALIZE DIET  TO GAIN WEIGHT SEE ACCOMPANYING  PHYSICIAN VISIT FORM

## 2018-06-11 ENCOUNTER — Encounter: Payer: Self-pay | Admitting: Cardiology

## 2018-06-11 NOTE — Assessment & Plan Note (Signed)
Plan was to check an echocardiogram this summer, but since she is not having any real symptoms and the murmur does not seem to be any worse, I think were fine waiting to discuss it next year. I do not think that they will be all that interested in pursuing any treatment for her aortic valve tolerate to worsen, but we can discuss in routine follow-up next May. We did discuss concerning possible symptoms of heart failure type symptoms, syncope or near syncope and angina.

## 2018-06-11 NOTE — Assessment & Plan Note (Signed)
Most recent lipids from August showed that her LDL is 143.  I suspect that is probably better with her having lost 9 pounds.  At this point I do not see the benefit of being aggressive with management.  Would hold off on statin for now.

## 2018-06-11 NOTE — Assessment & Plan Note (Signed)
Remains rate controlled.  No sensation of irregular heartbeats.  Continue current dose of Zebeta. Since she has not had any breakthrough tachycardia episodes with not being on Cartia, I do not see any reason for restarting. She remains on low-dose Xarelto.  No bleeding.  We talked about concern for falls and bleeding, but since she did okay with the last fall I think they are fine with continuing Xarelto.

## 2018-06-11 NOTE — Assessment & Plan Note (Signed)
Blood pressures look fine.  I would be fine with some mild permissive hypertension as opposed to hypotension.  Therefore I am fine with her not being on the Cartia or triamterene-HCTZ.

## 2018-06-13 DIAGNOSIS — I10 Essential (primary) hypertension: Secondary | ICD-10-CM | POA: Diagnosis not present

## 2018-06-13 DIAGNOSIS — I4891 Unspecified atrial fibrillation: Secondary | ICD-10-CM | POA: Diagnosis not present

## 2018-06-13 DIAGNOSIS — S72402D Unspecified fracture of lower end of left femur, subsequent encounter for closed fracture with routine healing: Secondary | ICD-10-CM | POA: Diagnosis not present

## 2018-06-13 DIAGNOSIS — F0390 Unspecified dementia without behavioral disturbance: Secondary | ICD-10-CM | POA: Diagnosis not present

## 2018-06-14 DIAGNOSIS — S72452D Displaced supracondylar fracture without intracondylar extension of lower end of left femur, subsequent encounter for closed fracture with routine healing: Secondary | ICD-10-CM | POA: Diagnosis not present

## 2018-06-14 DIAGNOSIS — S82852D Displaced trimalleolar fracture of left lower leg, subsequent encounter for closed fracture with routine healing: Secondary | ICD-10-CM | POA: Diagnosis not present

## 2018-07-03 ENCOUNTER — Encounter (HOSPITAL_COMMUNITY): Payer: Self-pay

## 2018-07-03 ENCOUNTER — Emergency Department (HOSPITAL_COMMUNITY): Payer: Medicare Other

## 2018-07-03 ENCOUNTER — Other Ambulatory Visit: Payer: Self-pay

## 2018-07-03 ENCOUNTER — Observation Stay (HOSPITAL_COMMUNITY): Payer: Medicare Other

## 2018-07-03 ENCOUNTER — Inpatient Hospital Stay (HOSPITAL_COMMUNITY)
Admission: EM | Admit: 2018-07-03 | Discharge: 2018-07-06 | DRG: 065 | Disposition: A | Discharge status: Skilled Nursing Facility | Payer: Medicare Other | Attending: Internal Medicine | Admitting: Internal Medicine

## 2018-07-03 DIAGNOSIS — I10 Essential (primary) hypertension: Secondary | ICD-10-CM | POA: Diagnosis present

## 2018-07-03 DIAGNOSIS — G35 Multiple sclerosis: Secondary | ICD-10-CM | POA: Diagnosis present

## 2018-07-03 DIAGNOSIS — I350 Nonrheumatic aortic (valve) stenosis: Secondary | ICD-10-CM | POA: Diagnosis not present

## 2018-07-03 DIAGNOSIS — I6389 Other cerebral infarction: Secondary | ICD-10-CM | POA: Diagnosis not present

## 2018-07-03 DIAGNOSIS — I639 Cerebral infarction, unspecified: Secondary | ICD-10-CM | POA: Diagnosis not present

## 2018-07-03 DIAGNOSIS — R29818 Other symptoms and signs involving the nervous system: Secondary | ICD-10-CM | POA: Diagnosis not present

## 2018-07-03 DIAGNOSIS — I272 Pulmonary hypertension, unspecified: Secondary | ICD-10-CM | POA: Diagnosis present

## 2018-07-03 DIAGNOSIS — E1151 Type 2 diabetes mellitus with diabetic peripheral angiopathy without gangrene: Secondary | ICD-10-CM | POA: Diagnosis present

## 2018-07-03 DIAGNOSIS — R29810 Facial weakness: Secondary | ICD-10-CM | POA: Diagnosis present

## 2018-07-03 DIAGNOSIS — R531 Weakness: Secondary | ICD-10-CM | POA: Diagnosis not present

## 2018-07-03 DIAGNOSIS — R4781 Slurred speech: Secondary | ICD-10-CM | POA: Diagnosis not present

## 2018-07-03 DIAGNOSIS — R402142 Coma scale, eyes open, spontaneous, at arrival to emergency department: Secondary | ICD-10-CM | POA: Diagnosis present

## 2018-07-03 DIAGNOSIS — Z993 Dependence on wheelchair: Secondary | ICD-10-CM

## 2018-07-03 DIAGNOSIS — Z887 Allergy status to serum and vaccine status: Secondary | ICD-10-CM

## 2018-07-03 DIAGNOSIS — Z7901 Long term (current) use of anticoagulants: Secondary | ICD-10-CM

## 2018-07-03 DIAGNOSIS — F0390 Unspecified dementia without behavioral disturbance: Secondary | ICD-10-CM | POA: Diagnosis present

## 2018-07-03 DIAGNOSIS — Z66 Do not resuscitate: Secondary | ICD-10-CM | POA: Diagnosis present

## 2018-07-03 DIAGNOSIS — I6350 Cerebral infarction due to unspecified occlusion or stenosis of unspecified cerebral artery: Secondary | ICD-10-CM

## 2018-07-03 DIAGNOSIS — R4182 Altered mental status, unspecified: Secondary | ICD-10-CM

## 2018-07-03 DIAGNOSIS — Z6828 Body mass index (BMI) 28.0-28.9, adult: Secondary | ICD-10-CM

## 2018-07-03 DIAGNOSIS — Z79899 Other long term (current) drug therapy: Secondary | ICD-10-CM

## 2018-07-03 DIAGNOSIS — R29706 NIHSS score 6: Secondary | ICD-10-CM | POA: Diagnosis present

## 2018-07-03 DIAGNOSIS — Z7951 Long term (current) use of inhaled steroids: Secondary | ICD-10-CM

## 2018-07-03 DIAGNOSIS — R791 Abnormal coagulation profile: Secondary | ICD-10-CM | POA: Diagnosis not present

## 2018-07-03 DIAGNOSIS — I4891 Unspecified atrial fibrillation: Secondary | ICD-10-CM | POA: Diagnosis not present

## 2018-07-03 DIAGNOSIS — R32 Unspecified urinary incontinence: Secondary | ICD-10-CM | POA: Diagnosis present

## 2018-07-03 DIAGNOSIS — Z96641 Presence of right artificial hip joint: Secondary | ICD-10-CM | POA: Diagnosis present

## 2018-07-03 DIAGNOSIS — Z743 Need for continuous supervision: Secondary | ICD-10-CM | POA: Diagnosis not present

## 2018-07-03 DIAGNOSIS — R410 Disorientation, unspecified: Secondary | ICD-10-CM | POA: Diagnosis present

## 2018-07-03 DIAGNOSIS — N183 Chronic kidney disease, stage 3 (moderate): Secondary | ICD-10-CM | POA: Diagnosis present

## 2018-07-03 DIAGNOSIS — E46 Unspecified protein-calorie malnutrition: Secondary | ICD-10-CM | POA: Diagnosis present

## 2018-07-03 DIAGNOSIS — M1990 Unspecified osteoarthritis, unspecified site: Secondary | ICD-10-CM | POA: Diagnosis present

## 2018-07-03 DIAGNOSIS — R402362 Coma scale, best motor response, obeys commands, at arrival to emergency department: Secondary | ICD-10-CM | POA: Diagnosis present

## 2018-07-03 DIAGNOSIS — E785 Hyperlipidemia, unspecified: Secondary | ICD-10-CM | POA: Diagnosis present

## 2018-07-03 DIAGNOSIS — Z96652 Presence of left artificial knee joint: Secondary | ICD-10-CM | POA: Diagnosis present

## 2018-07-03 DIAGNOSIS — I4821 Permanent atrial fibrillation: Secondary | ICD-10-CM | POA: Diagnosis present

## 2018-07-03 DIAGNOSIS — Z8673 Personal history of transient ischemic attack (TIA), and cerebral infarction without residual deficits: Secondary | ICD-10-CM

## 2018-07-03 DIAGNOSIS — G459 Transient cerebral ischemic attack, unspecified: Secondary | ICD-10-CM

## 2018-07-03 DIAGNOSIS — Z885 Allergy status to narcotic agent status: Secondary | ICD-10-CM

## 2018-07-03 DIAGNOSIS — Z888 Allergy status to other drugs, medicaments and biological substances status: Secondary | ICD-10-CM

## 2018-07-03 DIAGNOSIS — D509 Iron deficiency anemia, unspecified: Secondary | ICD-10-CM | POA: Diagnosis present

## 2018-07-03 DIAGNOSIS — I129 Hypertensive chronic kidney disease with stage 1 through stage 4 chronic kidney disease, or unspecified chronic kidney disease: Secondary | ICD-10-CM | POA: Diagnosis present

## 2018-07-03 DIAGNOSIS — E039 Hypothyroidism, unspecified: Secondary | ICD-10-CM | POA: Diagnosis present

## 2018-07-03 DIAGNOSIS — R402252 Coma scale, best verbal response, oriented, at arrival to emergency department: Secondary | ICD-10-CM | POA: Diagnosis present

## 2018-07-03 LAB — I-STAT CHEM 8, ED
BUN: 15 mg/dL (ref 8–23)
CHLORIDE: 107 mmol/L (ref 98–111)
CREATININE: 0.7 mg/dL (ref 0.44–1.00)
Calcium, Ion: 1.07 mmol/L — ABNORMAL LOW (ref 1.15–1.40)
Glucose, Bld: 108 mg/dL — ABNORMAL HIGH (ref 70–99)
HEMATOCRIT: 38 % (ref 36.0–46.0)
Hemoglobin: 12.9 g/dL (ref 12.0–15.0)
POTASSIUM: 3.5 mmol/L (ref 3.5–5.1)
Sodium: 141 mmol/L (ref 135–145)
TCO2: 26 mmol/L (ref 22–32)

## 2018-07-03 LAB — PROTIME-INR
INR: >10
INR: 1.75
PROTHROMBIN TIME: 20.3 s — AB (ref 11.4–15.2)
Prothrombin Time: >90 seconds — ABNORMAL HIGH (ref 11.4–15.2)

## 2018-07-03 LAB — CBC
HCT: 41.2 % (ref 36.0–46.0)
Hemoglobin: 12.8 g/dL (ref 12.0–15.0)
MCH: 29.4 pg (ref 26.0–34.0)
MCHC: 31.1 g/dL (ref 30.0–36.0)
MCV: 94.7 fL (ref 80.0–100.0)
Platelets: 252 10*3/uL (ref 150–400)
RBC: 4.35 MIL/uL (ref 3.87–5.11)
RDW: 13.6 % (ref 11.5–15.5)
WBC: 7.9 10*3/uL (ref 4.0–10.5)
nRBC: 0 % (ref 0.0–0.2)

## 2018-07-03 LAB — DIFFERENTIAL
ABS IMMATURE GRANULOCYTES: 0.02 10*3/uL (ref 0.00–0.07)
BASOS ABS: 0.1 10*3/uL (ref 0.0–0.1)
BASOS PCT: 1 %
EOS ABS: 0.3 10*3/uL (ref 0.0–0.5)
Eosinophils Relative: 4 %
Immature Granulocytes: 0 %
Lymphocytes Relative: 25 %
Lymphs Abs: 2 10*3/uL (ref 0.7–4.0)
Monocytes Absolute: 0.7 10*3/uL (ref 0.1–1.0)
Monocytes Relative: 9 %
NEUTROS PCT: 61 %
Neutro Abs: 4.8 10*3/uL (ref 1.7–7.7)

## 2018-07-03 LAB — COMPREHENSIVE METABOLIC PANEL
ALT: 13 U/L (ref 0–44)
AST: 21 U/L (ref 15–41)
Albumin: 3.8 g/dL (ref 3.5–5.0)
Alkaline Phosphatase: 163 U/L — ABNORMAL HIGH (ref 38–126)
Anion gap: 8 (ref 5–15)
BUN: 13 mg/dL (ref 8–23)
CO2: 23 mmol/L (ref 22–32)
Calcium: 8.9 mg/dL (ref 8.9–10.3)
Chloride: 109 mmol/L (ref 98–111)
Creatinine, Ser: 0.77 mg/dL (ref 0.44–1.00)
Glucose, Bld: 113 mg/dL — ABNORMAL HIGH (ref 70–99)
Potassium: 3.5 mmol/L (ref 3.5–5.1)
Sodium: 140 mmol/L (ref 135–145)
Total Bilirubin: 0.6 mg/dL (ref 0.3–1.2)
Total Protein: 6.3 g/dL — ABNORMAL LOW (ref 6.5–8.1)

## 2018-07-03 LAB — I-STAT TROPONIN, ED: Troponin i, poc: 0.01 ng/mL (ref 0.00–0.08)

## 2018-07-03 LAB — APTT: aPTT: 108 seconds — ABNORMAL HIGH (ref 24–36)

## 2018-07-03 MED ORDER — STROKE: EARLY STAGES OF RECOVERY BOOK
Freq: Once | Status: AC
  Administered 2018-07-03: 19:00:00
  Filled 2018-07-03 (×2): qty 1

## 2018-07-03 MED ORDER — SODIUM CHLORIDE 0.9 % IV SOLN
INTRAVENOUS | Status: DC
  Administered 2018-07-03: 19:00:00 via INTRAVENOUS

## 2018-07-03 MED ORDER — ACETAMINOPHEN 325 MG PO TABS
650.0000 mg | ORAL_TABLET | Freq: Four times a day (QID) | ORAL | Status: DC | PRN
  Administered 2018-07-03: 650 mg via ORAL
  Filled 2018-07-03: qty 2

## 2018-07-03 MED ORDER — BISOPROLOL FUMARATE 10 MG PO TABS
10.0000 mg | ORAL_TABLET | Freq: Every day | ORAL | Status: DC
  Administered 2018-07-04 – 2018-07-06 (×3): 10 mg via ORAL
  Filled 2018-07-03 (×3): qty 1

## 2018-07-03 MED ORDER — SENNA 8.6 MG PO TABS
1.0000 | ORAL_TABLET | Freq: Every day | ORAL | Status: DC
  Administered 2018-07-04 – 2018-07-06 (×3): 8.6 mg via ORAL
  Filled 2018-07-03 (×5): qty 1

## 2018-07-03 MED ORDER — GADOBUTROL 1 MMOL/ML IV SOLN: 7.0000 mL | Freq: Once | INTRAVENOUS | Status: DC | PRN

## 2018-07-03 MED ORDER — TRIMETHOPRIM 100 MG PO TABS
100.0000 mg | ORAL_TABLET | Freq: Every day | ORAL | Status: DC
  Administered 2018-07-04 – 2018-07-06 (×3): 100 mg via ORAL
  Filled 2018-07-03 (×4): qty 1

## 2018-07-03 MED ORDER — DOCUSATE SODIUM 100 MG PO CAPS
100.0000 mg | ORAL_CAPSULE | Freq: Two times a day (BID) | ORAL | Status: DC
  Administered 2018-07-03 – 2018-07-06 (×5): 100 mg via ORAL
  Filled 2018-07-03 (×7): qty 1

## 2018-07-03 MED ORDER — LEVOTHYROXINE SODIUM 125 MCG PO TABS
125.0000 ug | ORAL_TABLET | Freq: Every day | ORAL | Status: DC
  Administered 2018-07-06: 125 ug via ORAL
  Filled 2018-07-03 (×3): qty 1

## 2018-07-03 NOTE — H&P (Addendum)
History and Physical:    Makayla Thomas   QPR:[id number] DOB: 1926/03/18 DOA: 07/03/2018   Referring MD/provider: Dr. Gilford Raid PCP: Deland Pretty, MD   Patient coming from: SNF  Chief Complaint: left facial droop, slurred speech and right leg weakness.  History of Present Illness:   Makayla Thomas is an 84 y.o. female with past medical history significant for hypertension, atrial fibrillationon Xarelto, mild dementia who was in her usual state of health at her SNF until this morning when she noted that her forget her knife were dropping out of her right hand. She had earlier noted difficulty moving her legs as she tried to paddle herself in her wheelchair down the hallway. She noted that her right leg kept on getting caught.she was also noted to have some slurring of speech and patient was brought to the ED with a code stroke.  ED Course:  The patient was seen by neurology urgently who noted she has likely had a nonhemorrhagic stroke and was not a TPA candidate. They recommended admission for further stroke workup.  Also of note patient's INR is found to be 10. Patient is not supposed to be on Coumadin.  ROS:   ROS   Review of Systems: General: No fever, chills, weight changes Skin: No rashes, lesions, wounds Eyes: no discharge, redness, pain Endocrine: no heat/cold intolerance, no polyuria Respiratory: No cough,, shortness of breath, hemoptysis Cardiovascular: No palpitations, chest pain GU: No dysuria,urgency, increased frequency. She does admit to urinary incontinence which she has had for a while. Blood/lymphatics: No easy bruising, bleeding Mood/affect: No anxiety/depression    Past Medical History:   Past Medical History:  Diagnosis Date  . Anemia   . Aortic stenosis, moderate (was mild) 08/2009; 08/2014   a. ECHO  EF 55-60%, wth increased EDP, mild AS;; b. mild Conc LVH, EF 60-65%, DD with high LVEDP, MIld-Mod AS, Mod MAC with Mod MR, mild-mod PA HTN.  [name]  Complication of anesthesia    hallusinations  . Constipation   . Diabetes mellitus without complication ([place])    possibly  . Dyslipidemia    treated  . H/O cardiovascular stress test 05/2010   Lexiscan cardiolite no ischemia or infarction  . H/O multiple sclerosis    no excaerbations in some time  . History of ETT 02/2011   [place] exercise protocol, negative with poor exercise tolerance  . History of transfusion   . Hypertension   . Hypothyroidism   . Lower extremity edema    chronic  . Osteoarthritis    "generalized" (04/13/2018)  . PAD (peripheral artery disease) ([place]) 08/30/2009   Dopplers; with bil. post. tib artery occlusion  . Permanent atrial fibrillation    on Xarelto  . Stroke [facility] ~2011/2012   "memory issues since" (04/13/2018)  . Transient ischemic attack (TIA) "she's had several"   "memory issues since" (04/13/2018)  . Vaginal itching   . Visual disturbances    "FLASHING IN MY EYES"    Past Surgical History:   Past Surgical History:  Procedure Laterality Date  . BREAST SURGERY     L tumor removed - benign  . CATARACT EXTRACTION, BILATERAL Bilateral   . CESAREAN SECTION  1952; 1956; 1959  . CHOLECYSTECTOMY    . JOINT REPLACEMENT    . ORIF ANKLE FRACTURE Left 04/14/2018   Procedure: OPEN REDUCTION INTERNAL FIXATION (ORIF) ANKLE FRACTURE;  Surgeon: Shona Needles, MD;  Location: [place];  Service: Orthopedics;  Laterality: Left;  . ORIF FEMUR FRACTURE Left  04/14/2018   Procedure: OPEN REDUCTION INTERNAL FIXATION (ORIF) DISTAL FEMUR FRACTURE;  Surgeon: Shona Needles, MD;  Location: [place];  Service: Orthopedics;  Laterality: Left;  . ORIF PERIPROSTHETIC FRACTURE Right 02/07/2015   Procedure: OPEN REDUCTION INTERNAL FIXATION (ORIF) PERIPROSTHETIC FRACTURE WITH TOTAL HIP REVISION AND CABLES;  Surgeon: Paralee Cancel, MD;  Location: [facility];  Service: Orthopedics;  Laterality: Right;  . THYROIDECTOMY, PARTIAL Left   . TOTAL HIP ARTHROPLASTY Right 01/29/2015   Procedure:  RIGHT TOTAL HIP ARTHROPLASTY ANTERIOR APPROACH;  Surgeon: Paralee Cancel, MD;  Location: [facility];  Service: Orthopedics;  Laterality: Right;  . TOTAL KNEE ARTHROPLASTY Left 2012    Social History:   Social History   Socioeconomic History  . Marital status: Widowed    Spouse name: Not on file  . Number of children: 3  . Years of education: HS  . Highest education level: Not on file  Occupational History  . Occupation: Retired  Scientific laboratory technician  . Financial resource strain: Not on file  . Food insecurity:    Worry: Not on file    Inability: Not on file  . Transportation needs:    Medical: Not on file    Non-medical: Not on file  Tobacco Use  . Smoking status: Never Smoker  . Smokeless tobacco: Never Used  Substance and Sexual Activity  . Alcohol use: No    Alcohol/week: 0.0 standard drinks  . Drug use: No    Types: Fentanyl  . Sexual activity: Not on file  Lifestyle  . Physical activity:    Days per week: Not on file    Minutes per session: Not on file  . Stress: Not on file  Relationships  . Social connections:    Talks on phone: Not on file    Gets together: Not on file    Attends religious service: Not on file    Active member of club or organization: Not on file    Attends meetings of clubs or organizations: Not on file    Relationship status: Not on file  . Intimate partner violence:    Fear of current or ex partner: Not on file    Emotionally abused: Not on file    Physically abused: Not on file    Forced sexual activity: Not on file  Other Topics Concern  . Not on file  Social History Narrative   Uses a walker to walk   Drinks 1-2 cups of coffee a day     Allergies   Tetanus antitoxin; Tramadol; Augmentin [amoxicillin-pot clavulanate]; Codeine; Livalo [pitavastatin]; and Meclizine  Family history:   Family History  Problem Relation Age of Onset  . Heart failure Father 15  . Lung cancer Father     Current Medications:   Prior to Admission  medications   Medication Sig Start Date End Date Taking? Authorizing Provider  acetaminophen (TYLENOL) 325 MG tablet Take 2 tablets (650 mg total) by mouth every 6 (six) hours as needed for mild pain or moderate pain. 04/16/18   Regalado, Belkys A, MD  bisoprolol (ZEBETA) 10 MG tablet Take 1 tablet (10 mg total) by mouth daily. 04/17/18   Regalado, Belkys A, MD  cholecalciferol (VITAMIN D) 1000 UNITS tablet Take 1,000 Units by mouth every morning.     [provider]  Cranberry 500 MG TABS Take 250 mg by mouth daily.     [provider]  docusate sodium (COLACE) 100 MG capsule Take 1 capsule (100 mg total) by mouth  2 (two) times daily. 04/16/18   Regalado, Belkys A, MD  feeding supplement, ENSURE ENLIVE, (ENSURE ENLIVE) LIQD Take 237 mLs by mouth 2 (two) times daily between meals. 04/16/18   Regalado, Belkys A, MD  iron polysaccharides (NIFEREX) 150 MG capsule Take 1 capsule (150 mg total) by mouth daily. 04/17/18   Regalado, Belkys A, MD  levothyroxine (SYNTHROID, LEVOTHROID) 125 MCG tablet Take 125 mcg by mouth daily before breakfast.    [provider]  polyethylene glycol (MIRALAX / GLYCOLAX) packet Take 17 g by mouth daily. 04/17/18   Regalado, Belkys A, MD  Rivaroxaban (XARELTO) 15 MG TABS tablet Take 1 tablet (15 mg total) by mouth daily with breakfast. Patient taking differently: Take 15 mg by mouth daily with supper.  02/12/15   Hosie Poisson, MD  trimethoprim (TRIMPEX) 100 MG tablet Take 100 mg by mouth daily.  01/06/18   [provider]    Physical Exam:   Vitals:   07/03/18 1215 07/03/18 1252 07/03/18 1315  BP: (!) 150/77  (!) 142/74  Pulse: 72  67  Resp: 16  15  SpO2: 94%  99%  Weight:  72.6 kg   Height:  _0  (1.575 m)      Physical Exam: Blood pressure (!) 142/74, pulse 67, resp. rate 15, height _1  (1.575 m), weight 72.6 kg, SpO2 99 %. Gen: spry, bright well-appearing female looking much younger than stated age. Eyes: Sclerae anicteric.  Uterus membranes moist.. Chest: Moderately good air entry bilaterally with no adventitious sounds.  CV: Irregular, high-pitched 1/6 diastolic murmur at left sternal border without radiation Abdomen: NABS, soft, nondistended, nontender. No tenderness to light or deep palpation. No rebound, no guarding. Extremities: trace to 1+ edema above compression hose Skin: Warm and dry.  Neuro: per neuro note Psych: Patient is cooperative, logical and coherent with appropriate mood and affect.  Data Review:    Labs: Basic Metabolic Panel: Recent Labs  Lab 07/03/18 1219 07/03/18 1223  NA 140 141  K 3.5 3.5  CL 109 107  CO2 23  --   GLUCOSE 113* 108*  BUN 13 15  CREATININE 0.77 0.70  CALCIUM 8.9  --    Liver Function Tests: Recent Labs  Lab 07/03/18 1219  AST 21  ALT 13  ALKPHOS 163*  BILITOT 0.6  PROT 6.3*  ALBUMIN 3.8   No results for input(s): LIPASE, AMYLASE in the last 168 hours. No results for input(s): AMMONIA in the last 168 hours. CBC: Recent Labs  Lab 07/03/18 1219 07/03/18 1223  WBC 7.9  --   NEUTROABS 4.8  --   HGB 12.8 12.9  HCT 41.2 38.0  MCV 94.7  --   PLT 252  --    Cardiac Enzymes: No results for input(s): CKTOTAL, CKMB, CKMBINDEX, TROPONINI in the last 168 hours.  BNP (last 3 results) No results for input(s): PROBNP in the last 8760 hours. CBG: No results for input(s): GLUCAP in the last 168 hours.  Urinalysis    Component Value Date/Time   COLORURINE AMBER (A) 04/19/2018 0723   APPEARANCEUR TURBID (A) 04/19/2018 0723   LABSPEC 1.017 04/19/2018 0723   PHURINE 9.0 (H) 04/19/2018 0723   GLUCOSEU NEGATIVE 04/19/2018 0723   HGBUR SMALL (A) 04/19/2018 0723   BILIRUBINUR NEGATIVE 04/19/2018 0723   KETONESUR NEGATIVE 04/19/2018 0723   PROTEINUR 30 (A) 04/19/2018 0723   UROBILINOGEN 1.0 02/05/2015 0344   NITRITE NEGATIVE 04/19/2018 0723   LEUKOCYTESUR MODERATE (A) 04/19/2018 0037  Radiographic Studies: Ct Head Code Stroke Wo  Contrast  Result Date: 07/03/2018 CLINICAL DATA:  Code stroke. Right leg weakness, facial droop, and slurred speech. EXAM: CT HEAD WITHOUT CONTRAST TECHNIQUE: Contiguous axial images were obtained from the base of the skull through the vertex without intravenous contrast. COMPARISON:  Brain MRI 04/19/2018 and CT 04/13/2018 FINDINGS: Brain: There is no evidence of acute infarct, intracranial hemorrhage, mass, midline shift, or extra-axial fluid collection. Patchy to confluent hypodensities are again seen in the cerebral white matter bilaterally, similar to the prior CT and compatible with moderate chronic small vessel ischemic disease. Small chronic cerebellar infarcts on MRI are not well demonstrated by CT. Heterogeneous hypoattenuation in the basal ganglia corresponds to dilated perivascular spaces and right-sided lacunar infarcts on MRI. There is moderate cerebral atrophy. Vascular: Calcified atherosclerosis at the skull base. No hyperdense vessel. Skull: No fracture or focal osseous lesion. Sinuses/Orbits: Visualized paranasal sinuses and mastoid air cells are clear. Bilateral cataract extraction is noted. Other: None. ASPECTS [facility] Stroke Program Early CT Score) - Ganglionic level infarction (caudate, lentiform nuclei, internal capsule, insula, M1-M3 cortex): 7 - Supraganglionic infarction (M4-M6 cortex): 3 Total score (0-10 with 10 being normal): 10 IMPRESSION: 1. No evidence of acute intracranial abnormality. 2. ASPECTS is 10. 3. Moderate chronic small vessel ischemic disease and cerebral atrophy. These results were communicated to Dr. Lorraine Lax at 12:35 pm on 07/03/2018 by text page via the [facility] messaging system. Electronically Signed   By: Logan Bores M.D.   On: 07/03/2018 12:36    EKG: Independently reviewed. Atrial fibrillation at 70, normal axis, Q in V1 through V3, no acute ST-T wave changes.   Assessment/Plan:   Principal Problem:   Stroke [facility]) Active Problems:   Moderate aortic  stenosis   Permanent atrial fibrillation (HCC); CHA2DS2-VASc Score 6   Essential hypertension -well controlled   Dementia (HCC)   Supratherapeutic INR  STROKE Will admit for stroke workup per neurology recommendations: -- BP goal : Permissive HTN upto 220/110 mmHg )  --MRI Brain  --MRA of the head w/o and neck with contrast  --Echocardiogram -- ASA -- High intensity Statin if LDL > 70 -- HgbA1c, fasting lipid panel -- PT consult, OT consult, Speech consult --Telemetry monitoring --Frequent neuro checks --Stroke swallow screen   Of note, I have not started aspirin until I know what patient's repeat INR is. This can be started tomorrow. Statin can also be started tomorrow based upon LDL.   ELEVATED INR NOT ON COUMADIN It's hard to believe the patient has an INR of 10 given that she is not on Coumadin. We are repeating this stat  ATRIAL FIBRILLATION Continue bisoprolol for rate management Will hold to go for now given INR of 10 which is being repeated.  HYPERTENSION Continue bisoprolol  ASYMPTOMATIC BACTERIURIA Patient is apparently on trimethoprim 100 mg daily for bacteria. Patient specifically denies any dysuria, urgency, frequency or suprapubic discomfort.  She does admit to urinary incontinence which has been going on for a while. Will continue trimethoprim at suppressive doses per home medication  AORTIC STENOSIS Appears to be compensated at present.  DEMENTIA No official diagnosis but patient does have some short-term memory loss. She is otherwise delightful and provides a good history. Initiation of Aricept versus memantine can be done as an outpatient as warranted.  HYPOTHYROIDISM Continue Synthroid  STATUS POST LEFT HIP AND TRIMALLEOLAR FRACTURE Recently discharged 04/20/2018    Other information:   DVT prophylaxis: Lovenox ordered. Code Status: DNR Family Communication: Patient's son  and daughter-in-law were in the room throughout Disposition Plan:  SNF Consults called: neurology Admission status: inpatient   The medical decision making on this patient was of high complexity and the patient is at high risk for clinical deterioration, therefore this is a level 3 visit.   Dewaine Oats Tublu Bonney Berres [facility]  If 7PM-7AM, please contact night-coverage [email or web address] Password [email or web address] 07/03/2018, 2:24 PM

## 2018-07-03 NOTE — Progress Notes (Signed)
Received patient to unit at this time.  No s/s of distress noted.  Declines pain.  Ambulated from stretcher in hall to bed in room with x2 assist.  Vitals obtained, placed on telemetry, assessment completed at this time.  Family at bedside.

## 2018-07-03 NOTE — ED Notes (Signed)
Dr. Particia Nearing aware of INR >10 ; no further orders received

## 2018-07-03 NOTE — ED Provider Notes (Signed)
[facility] EMERGENCY DEPARTMENT Provider Note   CSN: [id number] Arrival date & time: 07/03/18  1212     History   Chief Complaint Chief Complaint  Patient presents with  . Code Stroke    HPI Makayla Thomas is a 84 y.o. female.  Pt presents to the ED today as a code stroke.  The pt was LSN b/t 10:30 and 10:45.  She was noted to have a right sided facial droop by staff.  She does have a hx of permanent a.fib and is on Xarelto.  Pt c/o right leg weakness.  CHA2DS2/VAS Stroke Risk Points  Current as of 11 days ago     6 >= 2 Points: High Risk  1 - 1.99 Points: Medium Risk  0 Points: Low Risk    This is the only CHA2DS2/VAS Stroke Risk Points available for the past  year.:  Last Change: N/A     Details    This score determines the patient's risk of having a stroke if the  patient has atrial fibrillation.       Points Metrics  0 Has Congestive Heart Failure:  No    Current as of 11 days ago  0 Has Vascular Disease:  No    Current as of 11 days ago  1 Has Hypertension:  Yes    Current as of 11 days ago  2 Age:  19    Current as of 11 days ago  0 Has Diabetes:  No    Current as of 11 days ago  2 Had Stroke:  Yes  Had TIA:  No  Had thromboembolism:  No    Current as of 11 days ago  1 Female:  Yes    Current as of 11 days ago              Past Medical History:  Diagnosis Date  . Anemia   . Aortic stenosis, moderate (was mild) 08/2009; 08/2014   a. ECHO  EF 55-60%, wth increased EDP, mild AS;; b. mild Conc LVH, EF 60-65%, DD with high LVEDP, MIld-Mod AS, Mod MAC with Mod MR, mild-mod PA HTN.  [name] Complication of anesthesia    hallusinations  . Constipation   . Diabetes mellitus without complication ([place])    possibly  . Dyslipidemia    treated  . H/O cardiovascular stress test 05/2010   Lexiscan cardiolite no ischemia or infarction  . H/O multiple sclerosis    no excaerbations in some time  . History of ETT 02/2011   [place] exercise  protocol, negative with poor exercise tolerance  . History of transfusion   . Hypertension   . Hypothyroidism   . Lower extremity edema    chronic  . Osteoarthritis    "generalized" (04/13/2018)  . PAD (peripheral artery disease) ([place]) 08/30/2009   Dopplers; with bil. post. tib artery occlusion  . Permanent atrial fibrillation    on Xarelto  . Stroke [facility]) ~2011/2012   "memory issues since" (04/13/2018)  . Transient ischemic attack (TIA) "she's had several"   "memory issues since" (04/13/2018)  . Vaginal itching   . Visual disturbances    "FLASHING IN MY EYES"    Patient Active Problem List   Diagnosis Date Noted  . Supracondylar fracture of left femur, closed, initial encounter ([place]) 04/13/2018  . Trimalleolar fracture of ankle, closed, left, initial encounter 04/13/2018  . CKD (chronic kidney disease) stage 3, GFR 30-59 ml/min ([facility]) 04/13/2018  . Hyperglycemia 04/13/2018  . Dementia (  [place]) 04/13/2018  . Periprosthetic fracture around internal prosthetic right hip joint ([place]) 02/05/2015  . Hypothyroidism 02/05/2015  . Obese 01/30/2015  . S/P right THA, AA 01/29/2015  . Rectal bleeding   . Anticoagulation adequate   . Stroke ([place])   . Dehydration 08/29/2014  . Nausea and vomiting 08/29/2014  . Diarrhea 08/29/2014  . Hypokalemia 08/29/2014  . Acute renal failure ([place]) 08/29/2014  . Leukocytosis 08/29/2014  . History of TIA (transient ischemic attack) 08/18/2014  . Essential hypertension -well controlled 01/10/2013  . Permanent atrial fibrillation ([place]); CHA2DS2-VASc Score 6   . Chronic anticoagulation   . Dyslipidemia, goal LDL below 100 - due aortic stenosis   . Moderate aortic stenosis 08/17/2009    Past Surgical History:  Procedure Laterality Date  . BREAST SURGERY     L tumor removed - benign  . CATARACT EXTRACTION, BILATERAL Bilateral   . CESAREAN SECTION  1952; 1956; 1959  . CHOLECYSTECTOMY    . JOINT REPLACEMENT    . ORIF ANKLE FRACTURE Left 04/14/2018    Procedure: OPEN REDUCTION INTERNAL FIXATION (ORIF) ANKLE FRACTURE;  Surgeon: Shona Needles, MD;  Location: [place];  Service: Orthopedics;  Laterality: Left;  . ORIF FEMUR FRACTURE Left 04/14/2018   Procedure: OPEN REDUCTION INTERNAL FIXATION (ORIF) DISTAL FEMUR FRACTURE;  Surgeon: Shona Needles, MD;  Location: [place];  Service: Orthopedics;  Laterality: Left;  . ORIF PERIPROSTHETIC FRACTURE Right 02/07/2015   Procedure: OPEN REDUCTION INTERNAL FIXATION (ORIF) PERIPROSTHETIC FRACTURE WITH TOTAL HIP REVISION AND CABLES;  Surgeon: Paralee Cancel, MD;  Location: [facility];  Service: Orthopedics;  Laterality: Right;  . THYROIDECTOMY, PARTIAL Left   . TOTAL HIP ARTHROPLASTY Right 01/29/2015   Procedure: RIGHT TOTAL HIP ARTHROPLASTY ANTERIOR APPROACH;  Surgeon: Paralee Cancel, MD;  Location: [facility];  Service: Orthopedics;  Laterality: Right;  . TOTAL KNEE ARTHROPLASTY Left 2012     OB History   None      Home Medications    Prior to Admission medications   Medication Sig Start Date End Date Taking? Authorizing Provider  acetaminophen (TYLENOL) 325 MG tablet Take 2 tablets (650 mg total) by mouth every 6 (six) hours as needed for mild pain or moderate pain. 04/16/18   Regalado, [place] A, MD  bisoprolol (ZEBETA) 10 MG tablet Take 1 tablet (10 mg total) by mouth daily. 04/17/18   Regalado, [place] A, MD  cholecalciferol (VITAMIN D) 1000 UNITS tablet Take 1,000 Units by mouth every morning.     [provider]  Cranberry 500 MG TABS Take 250 mg by mouth daily.     [provider]  docusate sodium (COLACE) 100 MG capsule Take 1 capsule (100 mg total) by mouth 2 (two) times daily. 04/16/18   Regalado, [place] A, MD  feeding supplement, ENSURE ENLIVE, (ENSURE ENLIVE) LIQD Take 237 mLs by mouth 2 (two) times daily between meals. 04/16/18   Regalado, [place] A, MD  iron polysaccharides (NIFEREX) 150 MG capsule Take 1 capsule (150 mg total) by mouth daily. 04/17/18   Regalado, [place] A, MD  levothyroxine  (SYNTHROID, LEVOTHROID) 125 MCG tablet Take 125 mcg by mouth daily before breakfast.    [provider]  polyethylene glycol (MIRALAX / GLYCOLAX) packet Take 17 g by mouth daily. 04/17/18   Regalado, [place] A, MD  Rivaroxaban (XARELTO) 15 MG TABS tablet Take 1 tablet (15 mg total) by mouth daily with breakfast. Patient taking differently: Take 15 mg by mouth daily with supper.  02/12/15   Karleen Hampshire,  Jeoffrey Massed, MD  trimethoprim (TRIMPEX) 100 MG tablet Take 100 mg by mouth daily.  01/06/18   [provider]    Family History Family History  Problem Relation Age of Onset  . Heart failure Father 30  . Lung cancer Father     Social History Social History   Tobacco Use  . Smoking status: Never Smoker  . Smokeless tobacco: Never Used  Substance Use Topics  . Alcohol use: No    Alcohol/week: 0.0 standard drinks  . Drug use: No    Types: Fentanyl     Allergies   Tetanus antitoxin; Tramadol; Augmentin [amoxicillin-pot clavulanate]; Codeine; Livalo [pitavastatin]; and Meclizine   Review of Systems Review of Systems  Neurological: Positive for facial asymmetry and weakness.     Physical Exam Updated Vital Signs BP (!) 142/74   Pulse 67   Resp 15   Ht _0  (1.575 m)   Wt 72.6 kg   SpO2 99%   BMI 29.26 kg/m   Physical Exam  Constitutional: She is oriented to person, place, and time. She appears well-developed and well-nourished.  HENT:  Head: Atraumatic.  Right Ear: External ear normal.  Left Ear: External ear normal.  Nose: Nose normal.  Mouth/Throat: Oropharynx is clear and moist.  Eyes: Pupils are equal, round, and reactive to light. Conjunctivae and EOM are normal.  Neck: Normal range of motion. Neck supple.  Cardiovascular: An irregularly irregular rhythm present.  Pulmonary/Chest: Effort normal and breath sounds normal.  Abdominal: Soft. Bowel sounds are normal.  Musculoskeletal: Normal range of motion.  Neurological: She is alert and oriented to person,  place, and time.  Mild drift right arm Right leg 1/5  Skin: Skin is warm. Capillary refill takes less than 2 seconds.  Psychiatric: She has a normal mood and affect.  Nursing note and vitals reviewed.    ED Treatments / Results  Labs (all labs ordered are listed, but only abnormal results are displayed) Labs Reviewed  COMPREHENSIVE METABOLIC PANEL - Abnormal; Notable for the following components:      Result Value   Glucose, Bld 113 (*)    Total Protein 6.3 (*)    Alkaline Phosphatase 163 (*)    All other components within normal limits  PROTIME-INR - Abnormal; Notable for the following components:   Prothrombin Time >90.0 (*)    INR >10.00 (*)    All other components within normal limits  APTT - Abnormal; Notable for the following components:   aPTT 108 (*)    All other components within normal limits  I-STAT CHEM 8, ED - Abnormal; Notable for the following components:   Glucose, Bld 108 (*)    Calcium, Ion 1.07 (*)    All other components within normal limits  CBC  DIFFERENTIAL  PROTIME-INR  URINALYSIS, ROUTINE W REFLEX MICROSCOPIC  I-STAT TROPONIN, ED  CBG MONITORING, ED    EKG EKG Interpretation  Date/Time:  Sunday July 03 2018 13:08:04 EST Ventricular Rate:  67 PR Interval:    QRS Duration: 86 QT Interval:  435 QTC Calculation: 460 R Axis:   52 Text Interpretation:  Atrial fibrillation Anterior infarct, old No significant change since last tracing Confirmed by Isla Pence [phone]) on 07/03/2018 1:16:43 PM   Radiology Ct Head Code Stroke Wo Contrast  Result Date: 07/03/2018 CLINICAL DATA:  Code stroke. Right leg weakness, facial droop, and slurred speech. EXAM: CT HEAD WITHOUT CONTRAST TECHNIQUE: Contiguous axial images were obtained from the base of the skull through the vertex  without intravenous contrast. COMPARISON:  Brain MRI 04/19/2018 and CT 04/13/2018 FINDINGS: Brain: There is no evidence of acute infarct, intracranial hemorrhage, mass,  midline shift, or extra-axial fluid collection. Patchy to confluent hypodensities are again seen in the cerebral white matter bilaterally, similar to the prior CT and compatible with moderate chronic small vessel ischemic disease. Small chronic cerebellar infarcts on MRI are not well demonstrated by CT. Heterogeneous hypoattenuation in the basal ganglia corresponds to dilated perivascular spaces and right-sided lacunar infarcts on MRI. There is moderate cerebral atrophy. Vascular: Calcified atherosclerosis at the skull base. No hyperdense vessel. Skull: No fracture or focal osseous lesion. Sinuses/Orbits: Visualized paranasal sinuses and mastoid air cells are clear. Bilateral cataract extraction is noted. Other: None. ASPECTS [facility] Stroke Program Early CT Score) - Ganglionic level infarction (caudate, lentiform nuclei, internal capsule, insula, M1-M3 cortex): 7 - Supraganglionic infarction (M4-M6 cortex): 3 Total score (0-10 with 10 being normal): 10 IMPRESSION: 1. No evidence of acute intracranial abnormality. 2. ASPECTS is 10. 3. Moderate chronic small vessel ischemic disease and cerebral atrophy. These results were communicated to Dr. Lorraine Lax at 12:35 pm on 07/03/2018 by text page via the [facility] messaging system. Electronically Signed   By: Logan Bores M.D.   On: 07/03/2018 12:36    Procedures Procedures (including critical care time)  Medications Ordered in ED Medications - No data to display   Initial Impression / Assessment and Plan / ED Course  I have reviewed the triage vital signs and the nursing notes.  Pertinent labs & imaging results that were available during my care of the patient were reviewed by me and considered in my medical decision making (see chart for details).  CRITICAL CARE Performed by: Isla Pence   Total critical care time: 30 minutes  Critical care time was exclusive of separately billable procedures and treating other patients.  Critical care was necessary to  treat or prevent imminent or life-threatening deterioration.  Critical care was time spent personally by me on the following activities: development of treatment plan with patient and/or surrogate as well as nursing, discussions with consultants, evaluation of patient's response to treatment, examination of patient, obtaining history from patient or surrogate, ordering and performing treatments and interventions, ordering and review of laboratory studies, ordering and review of radiographic studies, pulse oximetry and re-evaluation of patient's condition.    Stroke team met pt at the bridge.  CT showed no bleed or acute abnormality.  Pt d/w Dr. Lorraine Lax (neurology) who said she's not a candidate for tpa or intervention.  He recommends admission and MRI without contrast.  I have ordered the MRI, but it has not yet been done.  INR is also >10 and she's only on Xarelto.  No coumadin.  I will repeat to make sure that is not an error.  UA also ordered which is pending.  Pt d/w Dr. Jamse Arn (triad) for admission.   Final Clinical Impressions(s) / ED Diagnoses   Final diagnoses:  Cerebrovascular accident (CVA), unspecified mechanism ([place])  Elevated INR    ED Discharge Orders    None       Isla Pence, MD 07/03/18 1414

## 2018-07-03 NOTE — ED Triage Notes (Signed)
Pt arrived via [facility], pt from [facility] with c/o R sided facial droop, slurred speech (according to staff), and R sided leg wkness. Pt has hx of Afib and currently taking Xarelto, which she took this morning. EMS reports LVO is 1; 130/70, 70, 16, 98% on RA; CBG 127

## 2018-07-03 NOTE — Progress Notes (Signed)
Unable to obtain good images on mra of neck. Spoke with radiologist Dr Margo Aye who said he saw contrast in pulmonary arteries but not in carotid  Vessels was told to let Dr Marlyce Huge know and see if she wanted another study or a cta done. Spoke to Dr Marlyce Huge @ 6:30 pm and relayed message from radiologist and was told to send patient back upstairs and she might do a different test like ultrasound.

## 2018-07-03 NOTE — Progress Notes (Signed)
Transport at bedside to take patient down to MRI.

## 2018-07-03 NOTE — Consult Note (Addendum)
NEURO HOSPITALIST  CONSULT   Requesting Physician: Dr. Gilford Raid    Chief Complaint: right facial droop, slurred speech, right leg weakness  History obtained from:  Patient    HPI:                                                                                                                                         Makayla Thomas is an 84 y.o. female  With PMH stroke ( 2011/2012), HTN, HLD a. Fib ( xarelto), PAD, Hypothyroidism who presented to [facility] ED as a code stroke from [facility] with c/o right leg weakness and slurred speech.   Patient was in SNF. Per family she has been there 1 week. After rehab. She broke her left leg in 3 places. At 11:00 they noticed a sudden onset of  Left facial droop, slurred speech, and right leg weakness. Patient is wheelchair bound at baseline. Per family she has been having memory problems and has some dementia but has not been formally diagnosed.     ED course:  BP: 130/70 BG: 127 CTH: no hemorrhage   2011: previous stroke no deficits.  Date last known well: Date: 07/03/2018 Time last known well: Time: 10:45 tPA Given: No: on xarelto last dose this AM No Symptoms  Modified Rankin: Rankin Score=4 NIHSS:6     Past Medical History:  Diagnosis Date  . Anemia   . Aortic stenosis, moderate (was mild) 08/2009; 08/2014   a. ECHO  EF 55-60%, wth increased EDP, mild AS;; b. mild Conc LVH, EF 60-65%, DD with high LVEDP, MIld-Mod AS, Mod MAC with Mod MR, mild-mod PA HTN.  [name] Complication of anesthesia    hallusinations  . Constipation   . Diabetes mellitus without complication ([place])    possibly  . Dyslipidemia    treated  . H/O cardiovascular stress test 05/2010   Lexiscan cardiolite no ischemia or infarction  . H/O multiple sclerosis    no excaerbations in some time  . History of ETT 02/2011   [place]on exercise protocol, negative with poor exercise tolerance  . History of transfusion   .  Hypertension   . Hypothyroidism   . Lower extremity edema    chronic  . Osteoarthritis    "generalized" (04/13/2018)  . PAD (peripheral artery disease) ([place]) 08/30/2009   Dopplers; with bil. post. tib artery occlusion  . Permanent atrial fibrillation    on Xarelto  . Stroke [facility]) ~2011/2012   "memory issues since" (04/13/2018)  . Transient ischemic attack (TIA) "she's had several"   "memory issues since" (04/13/2018)  . Vaginal itching   . Visual disturbances    "  FLASHING IN MY EYES"    Past Surgical History:  Procedure Laterality Date  . BREAST SURGERY     L tumor removed - benign  . CATARACT EXTRACTION, BILATERAL Bilateral   . CESAREAN SECTION  1952; 1956; 1959  . CHOLECYSTECTOMY    . JOINT REPLACEMENT    . ORIF ANKLE FRACTURE Left 04/14/2018   Procedure: OPEN REDUCTION INTERNAL FIXATION (ORIF) ANKLE FRACTURE;  Surgeon: Shona Needles, MD;  Location: [place];  Service: Orthopedics;  Laterality: Left;  . ORIF FEMUR FRACTURE Left 04/14/2018   Procedure: OPEN REDUCTION INTERNAL FIXATION (ORIF) DISTAL FEMUR FRACTURE;  Surgeon: Shona Needles, MD;  Location: [place];  Service: Orthopedics;  Laterality: Left;  . ORIF PERIPROSTHETIC FRACTURE Right 02/07/2015   Procedure: OPEN REDUCTION INTERNAL FIXATION (ORIF) PERIPROSTHETIC FRACTURE WITH TOTAL HIP REVISION AND CABLES;  Surgeon: Paralee Cancel, MD;  Location: [facility];  Service: Orthopedics;  Laterality: Right;  . THYROIDECTOMY, PARTIAL Left   . TOTAL HIP ARTHROPLASTY Right 01/29/2015   Procedure: RIGHT TOTAL HIP ARTHROPLASTY ANTERIOR APPROACH;  Surgeon: Paralee Cancel, MD;  Location: [facility];  Service: Orthopedics;  Laterality: Right;  . TOTAL KNEE ARTHROPLASTY Left 2012    Family History  Problem Relation Age of Onset  . Heart failure Father 53  . Lung cancer Father          Social History:  reports that she has never smoked. She has never used smokeless tobacco. She reports that she does not drink alcohol or use drugs.  Allergies:   Allergies  Allergen Reactions  . Tetanus Antitoxin Anaphylaxis    Throat swelling  . Tramadol Other (See Comments)    HALLUCINATIONS; "and paranoia"  . Augmentin [Amoxicillin-Pot Clavulanate]     UNSPECIFIED REACTION   . Codeine     UNSPECIFIED REACTION   . Livalo [Pitavastatin]     UNSPECIFIED REACTION   . Meclizine Other (See Comments)    Had funny feeling    Medications:                                                                                                                           No current facility-administered medications for this encounter.    Current Outpatient Medications  Medication Sig Dispense Refill  . acetaminophen (TYLENOL) 325 MG tablet Take 2 tablets (650 mg total) by mouth every 6 (six) hours as needed for mild pain or moderate pain. 30 tablet 0  . bisoprolol (ZEBETA) 10 MG tablet Take 1 tablet (10 mg total) by mouth daily. 30 tablet 0  . cholecalciferol (VITAMIN D) 1000 UNITS tablet Take 1,000 Units by mouth every morning.     . Cranberry 500 MG TABS Take 250 mg by mouth daily.     [name] docusate sodium (COLACE) 100 MG capsule Take 1 capsule (100 mg total) by mouth 2 (two) times daily. 10 capsule 0  . feeding supplement, ENSURE ENLIVE, (ENSURE ENLIVE) LIQD Take 237 mLs by mouth 2 (two) times  daily between meals. 237 mL 12  . iron polysaccharides (NIFEREX) 150 MG capsule Take 1 capsule (150 mg total) by mouth daily. 30 capsule 0  . levothyroxine (SYNTHROID, LEVOTHROID) 125 MCG tablet Take 125 mcg by mouth daily before breakfast.    . polyethylene glycol (MIRALAX / GLYCOLAX) packet Take 17 g by mouth daily. 14 each 0  . Rivaroxaban (XARELTO) 15 MG TABS tablet Take 1 tablet (15 mg total) by mouth daily with breakfast. (Patient taking differently: Take 15 mg by mouth daily with supper. ) 30 tablet   . trimethoprim (TRIMPEX) 100 MG tablet Take 100 mg by mouth daily.   0     ROS:                                                                                                                                        ROS was performed and is negative except as noted in HPI    General Examination:                                                                                                      There were no vitals taken for this visit.  HEENT-  Normocephalic, no lesions, without obvious abnormality.  Normal external eye and conjunctiva.  Cardiovascular- S1-S2 audible, pulses palpable throughout   Lungs-no rhonchi or wheezing noted, no excessive working breathing.  Saturations within normal limits Abdomen- All 4 quadrants palpated and nontender Extremities- Warm, dry and intact Musculoskeletal-no joint tenderness, deformity or swelling Skin-warm and dry, no hyperpigmentation, vitiligo, or suspicious lesions  Neurological Examination Mental Status: Alert, oriented, thought content appropriate.  Speech fluent without evidence of aphasia.  Able to follow commands without difficulty. Cranial Nerves: II:  Visual fields grossly normal,  III,IV, VI: ptosis not present, extra-ocular motions intact bilaterally, pupils equal, round, reactive to light and accommodation V,VII: smile asymmetric, slight left facial droop at rest, facial light touch sensation normal bilaterally VIII: hearing normal bilaterally IX,X: uvula rises symmetrically XI: bilateral shoulder shrug XII: midline tongue extension Motor: Right : Upper extremity   4/5 Left:     Upper extremity   5/5  Lower extremity   2/5  Lower extremity   5/5 Tone and bulk:normal tone throughout; no atrophy noted Sensory: Pinprick and light touch intact throughout, bilaterally Deep Tendon Reflexes: 2+ and symmetric biceps and patella Plantars: Right: downgoing   Left: downgoing Cerebellar: normal finger-to-nose, normal heel-to-shin test on left side. Unable to perform with left leg Gait:  deferred   Lab Results: Basic Metabolic Panel: Recent Labs  Lab 07/03/18 1223  NA 141  K 3.5  CL 107   GLUCOSE 108*  BUN 15  CREATININE 0.70    CBC: Recent Labs  Lab 07/03/18 1219 07/03/18 1223  WBC 7.9  --   NEUTROABS 4.8  --   HGB 12.8 12.9  HCT 41.2 38.0  MCV 94.7  --   PLT 252  --      Imaging: Ct Head Code Stroke Wo Contrast  Result Date: 07/03/2018 CLINICAL DATA:  Code stroke. Right leg weakness, facial droop, and slurred speech. EXAM: CT HEAD WITHOUT CONTRAST TECHNIQUE: Contiguous axial images were obtained from the base of the skull through the vertex without intravenous contrast. COMPARISON:  Brain MRI 04/19/2018 and CT 04/13/2018 FINDINGS: Brain: There is no evidence of acute infarct, intracranial hemorrhage, mass, midline shift, or extra-axial fluid collection. Patchy to confluent hypodensities are again seen in the cerebral white matter bilaterally, similar to the prior CT and compatible with moderate chronic small vessel ischemic disease. Small chronic cerebellar infarcts on MRI are not well demonstrated by CT. Heterogeneous hypoattenuation in the basal ganglia corresponds to dilated perivascular spaces and right-sided lacunar infarcts on MRI. There is moderate cerebral atrophy. Vascular: Calcified atherosclerosis at the skull base. No hyperdense vessel. Skull: No fracture or focal osseous lesion. Sinuses/Orbits: Visualized paranasal sinuses and mastoid air cells are clear. Bilateral cataract extraction is noted. Other: None. ASPECTS [facility] Stroke Program Early CT Score) - Ganglionic level infarction (caudate, lentiform nuclei, internal capsule, insula, M1-M3 cortex): 7 - Supraganglionic infarction (M4-M6 cortex): 3 Total score (0-10 with 10 being normal): 10 IMPRESSION: 1. No evidence of acute intracranial abnormality. 2. ASPECTS is 10. 3. Moderate chronic small vessel ischemic disease and cerebral atrophy. These results were communicated to Dr. Lorraine Lax at 12:35 pm on 07/03/2018 by text page via the [facility] messaging system. Electronically Signed   By: Logan Bores M.D.   On:  07/03/2018 12:36   Laurey Morale, MSN, NP-C [facility] [phone]  07/03/2018, 12:28 PM   Attending physician note to follow with Assessment and plan .   Assessment: 84 y.o. female  With PMH stroke ( 2011/2012), HTN, HLD a. Fib ( xarelto), PAD, Hypothyroidism who presented to [facility] ED as a code stroke from [facility] with c/o right leg weakness and slurred speech. CTH: no hemorrhage. Not a candidate for TPA d/t xarelto. Further stroke work up needed.    Impression:  Small acute ischemic infarcts in the left centrum semiovale and left parietal lobe.  Stroke Risk Factors - atrial fibrillation, hyperlipidemia and hypertension    Recommendations: -- BP goal : Permissive HTN upto 220/110 mmHg )  --MRI Brain  --MRA of the head  -- Carotid doppler  --Echocardiogram --Resume Xarelto for now strokes are small and risk of hemorrhage is low, consider switching to Eliquis ; final decision to be decided by stroke team -- High intensity Statin if LDL > 70 -- HgbA1c, fasting lipid panel -- PT consult, OT consult, Speech consult --Telemetry monitoring --Frequent neuro checks --Stroke swallow screen   --please page stroke NP  Or  PA  Or MD from 8am -4 pm  as this patient from this time will be  followed by the stroke.   You can look them up on [email or web address]  Password [email or web address]   NEUROHOSPITALIST ADDENDUM Performed a face to face diagnostic evaluation.   I have reviewed the contents of history and physical exam as documented by  PA/ARNP/Resident and agree with above documentation.  I have discussed and formulated the above plan as documented. Edits to the note have been made as needed.  84 year old female with history of A. fib on Xarelto presents with right-sided weakness and right facial droop.  Candidate for TPA due to patient being on Xarelto.  Clinically does not appear to be in large vessel occlusion.  Patient patient has right facial droop, right arm drift and right  lower extremity weakness.  Does not appear to be aphasic and can name the objects accurately.  My brain and patient has 2 small acute infarcts in the left centrum semiovale and left parietal lobe.   Recommend admission for stroke work-up.  Continue Xarelto may consider switching to Eliquis.  Stroke team to follow.   Karena Addison Aroor MD [facility] [phone]   If 7pm to 7am, please call on call as listed on AMION.

## 2018-07-04 ENCOUNTER — Inpatient Hospital Stay (HOSPITAL_COMMUNITY): Payer: Medicare Other

## 2018-07-04 ENCOUNTER — Observation Stay (HOSPITAL_COMMUNITY): Payer: Medicare Other

## 2018-07-04 DIAGNOSIS — I63412 Cerebral infarction due to embolism of left middle cerebral artery: Secondary | ICD-10-CM

## 2018-07-04 DIAGNOSIS — G459 Transient cerebral ischemic attack, unspecified: Secondary | ICD-10-CM | POA: Diagnosis not present

## 2018-07-04 DIAGNOSIS — R29810 Facial weakness: Secondary | ICD-10-CM | POA: Diagnosis present

## 2018-07-04 DIAGNOSIS — N183 Chronic kidney disease, stage 3 (moderate): Secondary | ICD-10-CM | POA: Diagnosis present

## 2018-07-04 DIAGNOSIS — J410 Simple chronic bronchitis: Secondary | ICD-10-CM | POA: Diagnosis not present

## 2018-07-04 DIAGNOSIS — I129 Hypertensive chronic kidney disease with stage 1 through stage 4 chronic kidney disease, or unspecified chronic kidney disease: Secondary | ICD-10-CM | POA: Diagnosis present

## 2018-07-04 DIAGNOSIS — R402362 Coma scale, best motor response, obeys commands, at arrival to emergency department: Secondary | ICD-10-CM | POA: Diagnosis present

## 2018-07-04 DIAGNOSIS — E039 Hypothyroidism, unspecified: Secondary | ICD-10-CM | POA: Diagnosis present

## 2018-07-04 DIAGNOSIS — I4821 Permanent atrial fibrillation: Secondary | ICD-10-CM | POA: Diagnosis present

## 2018-07-04 DIAGNOSIS — I6350 Cerebral infarction due to unspecified occlusion or stenosis of unspecified cerebral artery: Secondary | ICD-10-CM

## 2018-07-04 DIAGNOSIS — I6389 Other cerebral infarction: Secondary | ICD-10-CM | POA: Diagnosis present

## 2018-07-04 DIAGNOSIS — E1151 Type 2 diabetes mellitus with diabetic peripheral angiopathy without gangrene: Secondary | ICD-10-CM | POA: Diagnosis present

## 2018-07-04 DIAGNOSIS — R410 Disorientation, unspecified: Secondary | ICD-10-CM

## 2018-07-04 DIAGNOSIS — I340 Nonrheumatic mitral (valve) insufficiency: Secondary | ICD-10-CM

## 2018-07-04 DIAGNOSIS — R32 Unspecified urinary incontinence: Secondary | ICD-10-CM | POA: Diagnosis present

## 2018-07-04 DIAGNOSIS — F0391 Unspecified dementia with behavioral disturbance: Secondary | ICD-10-CM | POA: Diagnosis not present

## 2018-07-04 DIAGNOSIS — Z96641 Presence of right artificial hip joint: Secondary | ICD-10-CM | POA: Diagnosis present

## 2018-07-04 DIAGNOSIS — Z66 Do not resuscitate: Secondary | ICD-10-CM | POA: Diagnosis present

## 2018-07-04 DIAGNOSIS — R402252 Coma scale, best verbal response, oriented, at arrival to emergency department: Secondary | ICD-10-CM | POA: Diagnosis present

## 2018-07-04 DIAGNOSIS — R402142 Coma scale, eyes open, spontaneous, at arrival to emergency department: Secondary | ICD-10-CM | POA: Diagnosis present

## 2018-07-04 DIAGNOSIS — F0390 Unspecified dementia without behavioral disturbance: Secondary | ICD-10-CM

## 2018-07-04 DIAGNOSIS — D509 Iron deficiency anemia, unspecified: Secondary | ICD-10-CM | POA: Diagnosis present

## 2018-07-04 DIAGNOSIS — G35 Multiple sclerosis: Secondary | ICD-10-CM | POA: Diagnosis present

## 2018-07-04 DIAGNOSIS — I272 Pulmonary hypertension, unspecified: Secondary | ICD-10-CM | POA: Diagnosis present

## 2018-07-04 DIAGNOSIS — R29706 NIHSS score 6: Secondary | ICD-10-CM | POA: Diagnosis present

## 2018-07-04 DIAGNOSIS — I10 Essential (primary) hypertension: Secondary | ICD-10-CM | POA: Diagnosis not present

## 2018-07-04 DIAGNOSIS — R29818 Other symptoms and signs involving the nervous system: Secondary | ICD-10-CM | POA: Diagnosis not present

## 2018-07-04 DIAGNOSIS — I350 Nonrheumatic aortic (valve) stenosis: Secondary | ICD-10-CM | POA: Diagnosis present

## 2018-07-04 DIAGNOSIS — E46 Unspecified protein-calorie malnutrition: Secondary | ICD-10-CM | POA: Diagnosis present

## 2018-07-04 DIAGNOSIS — Z7401 Bed confinement status: Secondary | ICD-10-CM | POA: Diagnosis not present

## 2018-07-04 DIAGNOSIS — R4781 Slurred speech: Secondary | ICD-10-CM | POA: Diagnosis present

## 2018-07-04 DIAGNOSIS — Z96652 Presence of left artificial knee joint: Secondary | ICD-10-CM | POA: Diagnosis present

## 2018-07-04 DIAGNOSIS — R791 Abnormal coagulation profile: Secondary | ICD-10-CM | POA: Diagnosis not present

## 2018-07-04 DIAGNOSIS — E785 Hyperlipidemia, unspecified: Secondary | ICD-10-CM | POA: Diagnosis present

## 2018-07-04 DIAGNOSIS — M2550 Pain in unspecified joint: Secondary | ICD-10-CM | POA: Diagnosis not present

## 2018-07-04 DIAGNOSIS — R531 Weakness: Secondary | ICD-10-CM | POA: Diagnosis not present

## 2018-07-04 DIAGNOSIS — M1990 Unspecified osteoarthritis, unspecified site: Secondary | ICD-10-CM | POA: Diagnosis present

## 2018-07-04 LAB — ECHOCARDIOGRAM COMPLETE
Height: 62 in
Weight: 2469.15 oz

## 2018-07-04 LAB — PROTIME-INR
INR: 1.26
PROTHROMBIN TIME: 15.6 s — AB (ref 11.4–15.2)

## 2018-07-04 LAB — LIPID PANEL
CHOL/HDL RATIO: 4 ratio
Cholesterol: 176 mg/dL (ref 0–200)
HDL: 44 mg/dL (ref >40)
LDL CALC: 110 mg/dL — AB (ref 0–99)
TRIGLYCERIDES: 112 mg/dL (ref <150)
VLDL: 22 mg/dL (ref 0–40)

## 2018-07-04 LAB — GLUCOSE, CAPILLARY: Glucose-Capillary: 98 mg/dL (ref 70–99)

## 2018-07-04 LAB — VITAMIN B12: Vitamin B-12: 247 pg/mL (ref 180–914)

## 2018-07-04 LAB — HEMOGLOBIN A1C
HEMOGLOBIN A1C: 5.2 % (ref 4.8–5.6)
Mean Plasma Glucose: 102.54 mg/dL

## 2018-07-04 MED ORDER — RIVAROXABAN 15 MG PO TABS: 15.0000 mg | ORAL_TABLET | Freq: Every day | ORAL | Status: DC

## 2018-07-04 MED ORDER — HALOPERIDOL LACTATE 5 MG/ML IJ SOLN
2.5000 mg | Freq: Once | INTRAMUSCULAR | Status: AC
  Administered 2018-07-04: 2.5 mg via INTRAVENOUS

## 2018-07-04 MED ORDER — ASPIRIN 325 MG PO TABS
325.0000 mg | ORAL_TABLET | Freq: Every day | ORAL | Status: DC
  Administered 2018-07-04 – 2018-07-05 (×2): 325 mg via ORAL
  Filled 2018-07-04 (×2): qty 1

## 2018-07-04 MED ORDER — HALOPERIDOL LACTATE 5 MG/ML IJ SOLN
5.0000 mg | Freq: Once | INTRAMUSCULAR | Status: AC
  Administered 2018-07-04: 5 mg via INTRAVENOUS
  Filled 2018-07-04: qty 1

## 2018-07-04 MED ORDER — ENSURE ENLIVE PO LIQD
237.0000 mL | Freq: Two times a day (BID) | ORAL | Status: DC
  Administered 2018-07-04 – 2018-07-06 (×3): 237 mL via ORAL

## 2018-07-04 MED ORDER — APIXABAN 5 MG PO TABS
5.0000 mg | ORAL_TABLET | Freq: Two times a day (BID) | ORAL | Status: DC
  Administered 2018-07-05 – 2018-07-06 (×3): 5 mg via ORAL
  Filled 2018-07-04 (×6): qty 1

## 2018-07-04 MED ORDER — HALOPERIDOL LACTATE 5 MG/ML IJ SOLN
INTRAMUSCULAR | Status: AC
  Filled 2018-07-04: qty 1

## 2018-07-04 NOTE — Care Management Note (Signed)
Case Management Note  Patient Details  Name: Makayla Thomas MRN: [id number] Date of Birth: 1925-09-04  Subjective/Objective:     Pt admitted with a stroke. She is from [facility].                Action/Plan: Plan is for SNF rehab at d/c. CM following for d/c disposition.  Expected Discharge Date:                  Expected Discharge Plan:  Skilled Nursing Facility  In-House Referral:  Clinical Social Work  Discharge planning Services     Post Acute Care Choice:    Choice offered to:     DME Arranged:    DME Agency:     HH Arranged:    HH Agency:     Status of Service:  In process, will continue to follow  If discussed at Long Length of Stay Meetings, dates discussed:    Additional Comments:  Kermit Balo, RN 07/04/2018, 1:25 PM

## 2018-07-04 NOTE — Progress Notes (Signed)
Initial Nutrition Assessment  DOCUMENTATION CODES:   Not applicable  INTERVENTION:  Provide Ensure Enlive po BID, each supplement provides 350 kcal and 20 grams of protein.  Encourage adequate PO intake.   NUTRITION DIAGNOSIS:   Increased nutrient needs related to acute illness as evidenced by estimated needs.  GOAL:   Patient will meet greater than or equal to 90% of their needs  MONITOR:   PO intake, Supplement acceptance, Labs, Weight trends, I & O's, Skin  REASON FOR ASSESSMENT:   Malnutrition Screening Tool    ASSESSMENT:   84 year old female with essential hypertension, A. fib on Xarelto, dementia who was admitted for concerns of upper extremity weakness, confusion and CVA.  MRI showed 2 small acute infarct.  Pt unavailable during time of visit. RD unable to obtain most recent nutrition history. Weight loss not significant per weight records. RD to order nutritional supplements to aid in caloric and protein needs.   Unable to complete Nutrition-Focused physical exam at this time.   Labs and medications reviewed.   Diet Order:   Diet Order            Diet Heart Room service appropriate? Yes; Fluid consistency: Thin  Diet effective now              EDUCATION NEEDS:   Not appropriate for education at this time  Skin:  Skin Assessment: Reviewed RN Assessment  Last BM:  11/15  Height:   Ht Readings from Last 1 Encounters:  07/03/18 5\' 2"  (1.575 m)    Weight:   Wt Readings from Last 1 Encounters:  07/03/18 70 kg    Ideal Body Weight:  50 kg  BMI:  Body mass index is 28.23 kg/m.  Estimated Nutritional Needs:   Kcal:  1400-1650  Protein:  60-75 grams  Fluid:  >/= 1.5 L/day    Roslyn Smiling, MS, RD, LDN Pager # [phone] After hours/ weekend pager # [phone]

## 2018-07-04 NOTE — Progress Notes (Addendum)
STROKE TEAM PROGRESS NOTE   INTERVAL HISTORY No family is at the bedside.  Pt with recurrent expressive aphasia this am, stat CT unremarkable. She said she became upset when someone told her that her son had a stroke. She is waiting for him to arrive now- she has talked to him and is less upset about the news than previously. Admits she has memory difficulties.  Vitals:   07/04/18 0137 07/04/18 0524 07/04/18 0605 07/04/18 0732  BP: (!) 149/77 136/85 (!) 164/119 (!) 156/72  Pulse: 63 62 63 60  Resp: 20 20 (!) 22 20  Temp: (!) 97.5 F (36.4 C) (!) 97.5 F (36.4 C) (!) 97.4 F (36.3 C) (!) 97.4 F (36.3 C)  TempSrc: Oral Oral Oral Oral  SpO2: 98% 97% 93% 99%  Weight:      Height:        CBC:  Recent Labs  Lab 07/03/18 1219 07/03/18 1223  WBC 7.9  --   NEUTROABS 4.8  --   HGB 12.8 12.9  HCT 41.2 38.0  MCV 94.7  --   PLT 252  --     Basic Metabolic Panel:  Recent Labs  Lab 07/03/18 1219 07/03/18 1223  NA 140 141  K 3.5 3.5  CL 109 107  CO2 23  --   GLUCOSE 113* 108*  BUN 13 15  CREATININE 0.77 0.70  CALCIUM 8.9  --    Lipid Panel:     Component Value Date/Time   CHOL 176 07/04/2018 0653   TRIG 112 07/04/2018 0653   HDL 44 07/04/2018 0653   CHOLHDL 4.0 07/04/2018 0653   VLDL 22 07/04/2018 0653   LDLCALC 110 (H) 07/04/2018 0653   HgbA1c:  Lab Results  Component Value Date   HGBA1C 5.2 07/03/2018   Urine Drug Screen: No results found for: LABOPIA, COCAINSCRNUR, LABBENZ, AMPHETMU, THCU, LABBARB  Alcohol Level No results found for: ETH  IMAGING Ct Head Wo Contrast  Result Date: 07/04/2018 CLINICAL DATA:  Neuro change with speech difficulty EXAM: CT HEAD WITHOUT CONTRAST TECHNIQUE: Contiguous axial images were obtained from the base of the skull through the vertex without intravenous contrast. COMPARISON:  Brain MRI from yesterday.  Head CT yesterday. FINDINGS: Brain: 2 known small acute infarcts by MRI are not detected on this CT. There is generalized  atrophy and chronic small vessel ischemia with confluent gliosis in the cerebral white matter. No hemorrhage, hydrocephalus, or collection. Vascular: Atherosclerotic calcification.  No hyperdense vessel Skull: Negative Sinuses/Orbits: Bilateral cataract resection IMPRESSION: No acute finding or change from yesterday. Electronically Signed   By: Marnee Spring M.D.   On: 07/04/2018 07:55   Mr Brain Wo Contrast  Result Date: 07/03/2018 CLINICAL DATA:  84 year old female with right leg weakness, facial droop and slurred speech. EXAM: MRI HEAD WITHOUT CONTRAST MRA HEAD WITHOUT CONTRAST TECHNIQUE: Multiplanar, multiecho pulse sequences of the brain and surrounding structures were obtained without intravenous contrast. Angiographic images of the head were obtained using MRA technique without contrast. CONTRAST:  7 milliliters of Gadavist was administered for attempted Neck MRA, but for unclear technical reasons that study was nondiagnostic (both the time-of-flight and post-contrast portions) and is not being charged. No postcontrast imaging of the brain was requested or obtained. COMPARISON:  Head CT without contrast earlier today. Brain MRI 04/19/2018. FINDINGS: MRI HEAD FINDINGS Brain: There are 2 small foci of restricted diffusion identified. The larger measuring 8 millimeters is in the left centrum semiovale on series 3, image 37. There is a  smaller cortical area of restricted diffusion along the posterolateral left lower parietal lobe on series 3, image 30. No other restricted diffusion identified. No associated acute hemorrhage or mass effect. Underlying severe widespread T2 and FLAIR signal hyperintensity in the cerebral white matter and throughout the deep gray matter nuclei stable since September. Comparatively mild for age T2 heterogeneity in the pons and there are 1 or 2 unchanged tiny chronic lacunar infarcts in the cerebellum (series 8, image 11). Occasional scattered chronic microhemorrhages are  stable, including those in both thalami and the central pons No midline shift, mass effect, evidence of mass lesion, ventriculomegaly, extra-axial collection or acute intracranial hemorrhage. Cervicomedullary junction and pituitary are within normal limits. Vascular: Major intracranial vascular flow voids are stable since September. Skull and upper cervical spine: Negative for age visible cervical spine. Normal bone marrow signal. Sinuses/Orbits: Stable and negative. Other: Mastoids remain clear. Visible internal auditory structures appear normal. Scalp and face soft tissues appear negative. MRA HEAD FINDINGS Antegrade flow in the distal vertebral arteries. The right vertebral appears to functionally terminates in PICA. The left PICA is patent. The distal left vertebral supplies the basilar without definite stenosis. The basilar artery is diminutive on the basis of fetal type bilateral PCA origins. Both SCA origins are patent. Bilateral posterior communicating arteries and PCA branches are patent with no proximal stenosis. Antegrade flow in both ICA siphons. Bilateral siphon irregularity without stenosis. Normal ophthalmic and posterior communicating artery origins. Patent carotid termini. Normal MCA and ACA origins. The left A1 is dominant and the right is diminutive. The anterior communicating artery and visible ACA branches are within normal limits. MCA M1 segments and MCA bi/trifurcations are patent. No proximal MCA branch stenosis. IMPRESSION: 1. There are two small acute infarcts in the left centrum semiovale and left parietal lobe cortex with no hemorrhage or mass effect. 2. No other acute intracranial abnormality. Underlying advanced signal changes in the brain compatible with chronic small vessel disease. 3. Intracranial MRA is negative for large vessel occlusion or proximal branch stenosis. 4. Neck MRA was attempted with 7 mL IV Gadavist but for unclear technical reasons was non-diagnostic and is not being  charged. Electronically Signed   By: Odessa Fleming M.D.   On: 07/03/2018 18:26   Mr Maxine Glenn Head Wo Contrast  Result Date: 07/03/2018 CLINICAL DATA:  84 year old female with right leg weakness, facial droop and slurred speech. EXAM: MRI HEAD WITHOUT CONTRAST MRA HEAD WITHOUT CONTRAST TECHNIQUE: Multiplanar, multiecho pulse sequences of the brain and surrounding structures were obtained without intravenous contrast. Angiographic images of the head were obtained using MRA technique without contrast. CONTRAST:  7 milliliters of Gadavist was administered for attempted Neck MRA, but for unclear technical reasons that study was nondiagnostic (both the time-of-flight and post-contrast portions) and is not being charged. No postcontrast imaging of the brain was requested or obtained. COMPARISON:  Head CT without contrast earlier today. Brain MRI 04/19/2018. FINDINGS: MRI HEAD FINDINGS Brain: There are 2 small foci of restricted diffusion identified. The larger measuring 8 millimeters is in the left centrum semiovale on series 3, image 37. There is a smaller cortical area of restricted diffusion along the posterolateral left lower parietal lobe on series 3, image 30. No other restricted diffusion identified. No associated acute hemorrhage or mass effect. Underlying severe widespread T2 and FLAIR signal hyperintensity in the cerebral white matter and throughout the deep gray matter nuclei stable since September. Comparatively mild for age T2 heterogeneity in the pons and there are 1  or 2 unchanged tiny chronic lacunar infarcts in the cerebellum (series 8, image 11). Occasional scattered chronic microhemorrhages are stable, including those in both thalami and the central pons No midline shift, mass effect, evidence of mass lesion, ventriculomegaly, extra-axial collection or acute intracranial hemorrhage. Cervicomedullary junction and pituitary are within normal limits. Vascular: Major intracranial vascular flow voids are stable  since September. Skull and upper cervical spine: Negative for age visible cervical spine. Normal bone marrow signal. Sinuses/Orbits: Stable and negative. Other: Mastoids remain clear. Visible internal auditory structures appear normal. Scalp and face soft tissues appear negative. MRA HEAD FINDINGS Antegrade flow in the distal vertebral arteries. The right vertebral appears to functionally terminates in PICA. The left PICA is patent. The distal left vertebral supplies the basilar without definite stenosis. The basilar artery is diminutive on the basis of fetal type bilateral PCA origins. Both SCA origins are patent. Bilateral posterior communicating arteries and PCA branches are patent with no proximal stenosis. Antegrade flow in both ICA siphons. Bilateral siphon irregularity without stenosis. Normal ophthalmic and posterior communicating artery origins. Patent carotid termini. Normal MCA and ACA origins. The left A1 is dominant and the right is diminutive. The anterior communicating artery and visible ACA branches are within normal limits. MCA M1 segments and MCA bi/trifurcations are patent. No proximal MCA branch stenosis. IMPRESSION: 1. There are two small acute infarcts in the left centrum semiovale and left parietal lobe cortex with no hemorrhage or mass effect. 2. No other acute intracranial abnormality. Underlying advanced signal changes in the brain compatible with chronic small vessel disease. 3. Intracranial MRA is negative for large vessel occlusion or proximal branch stenosis. 4. Neck MRA was attempted with 7 mL IV Gadavist but for unclear technical reasons was non-diagnostic and is not being charged. Electronically Signed   By: Odessa Fleming M.D.   On: 07/03/2018 18:26   Ct Head Code Stroke Wo Contrast  Result Date: 07/03/2018 CLINICAL DATA:  Code stroke. Right leg weakness, facial droop, and slurred speech. EXAM: CT HEAD WITHOUT CONTRAST TECHNIQUE: Contiguous axial images were obtained from the base of  the skull through the vertex without intravenous contrast. COMPARISON:  Brain MRI 04/19/2018 and CT 04/13/2018 FINDINGS: Brain: There is no evidence of acute infarct, intracranial hemorrhage, mass, midline shift, or extra-axial fluid collection. Patchy to confluent hypodensities are again seen in the cerebral white matter bilaterally, similar to the prior CT and compatible with moderate chronic small vessel ischemic disease. Small chronic cerebellar infarcts on MRI are not well demonstrated by CT. Heterogeneous hypoattenuation in the basal ganglia corresponds to dilated perivascular spaces and right-sided lacunar infarcts on MRI. There is moderate cerebral atrophy. Vascular: Calcified atherosclerosis at the skull base. No hyperdense vessel. Skull: No fracture or focal osseous lesion. Sinuses/Orbits: Visualized paranasal sinuses and mastoid air cells are clear. Bilateral cataract extraction is noted. Other: None. ASPECTS [facility] Stroke Program Early CT Score) - Ganglionic level infarction (caudate, lentiform nuclei, internal capsule, insula, M1-M3 cortex): 7 - Supraganglionic infarction (M4-M6 cortex): 3 Total score (0-10 with 10 being normal): 10 IMPRESSION: 1. No evidence of acute intracranial abnormality. 2. ASPECTS is 10. 3. Moderate chronic small vessel ischemic disease and cerebral atrophy. These results were communicated to Dr. Laurence Slate at 12:35 pm on 07/03/2018 by text page via the [facility] [place] messaging system. Electronically Signed   By: Sebastian Ache M.D.   On: 07/03/2018 12:36   Carotid Doppler   There is 1-39% bilateral ICA stenosis. Vertebral artery flow is antegrade.   2D Echocardiogram  -  Left ventricle: The cavity size was normal. Wall thickness was   increased in a pattern of moderate LVH. Systolic function was   normal. The estimated ejection fraction was in the range of 60%   to 65%. The study is not technically sufficient to allow   evaluation of LV diastolic function. - Aortic valve: Mildly  calcified leaflets. Moderate stenosis. There   was trivial regurgitation. Mean gradient (S): 18 mm Hg. Peak   gradient (S): 36 mm Hg. Valve area (VTI): 0.96 cm^2. Valve area   (Vmax): 1.05 cm^2. Valve area (Vmean): 1.03 cm^2. - Mitral valve: Calcified annulus. Mildly thickened leaflets .   There was mild regurgitation. - Left atrium: Severely dilated. - Right ventricle: The cavity size was mildly dilated. TAPSE: 11.7   mm . - Right atrium: Moderately dilated. - Tricuspid valve: There was moderate regurgitation. - Pulmonary arteries: PA peak pressure: 56 mm Hg (S). - Inferior vena cava: The vessel was normal in size. The   respirophasic diameter changes were in the normal range (>= 50%),   consistent with normal central venous pressure. Impressions: - Compared to a prior study in 2016, the LVEF is unchanged at 60-65%. There is now moderate to severe aortic stenosis - AVA around 1.0 cm2 with mean gradient of 18 mmHg (stable), however, LVOT measures 2.0 cm. There is severe LAE and moderate RAE and moderate pulmonary hypertension with an RVSP of 56 mmHg.  PHYSICAL EXAM HEENT-  Normocephalic, no lesions, without obvious abnormality.  Normal external eye and conjunctiva.  Cardiovascular- S1-S2 audible, pulses palpable throughout   Lungs-no rhonchi or wheezing noted, no excessive working breathing.   Extremities- Warm, dry and intact Musculoskeletal-no joint tenderness, deformity or swelling Skin-warm and dry, no hyperpigmentation, vitiligo, or suspicious lesions  Neurological Examination Mental Status: Alert, oriented, to person and place, thought the year was 2028. thought content appropriate.  Speech fluent without evidence of aphasia.  Able to follow commands without difficulty.  Cranial Nerves: II:  Visual fields grossly normal,  III,IV, VI: ptosis not present, extra-ocular motions intact bilaterally, pupils equal, round, reactive to light and accommodation V,VII: smile asymmetric,  slight left facial droop at rest, facial light touch sensation normal bilaterally VIII: hearing normal bilaterally IX,X: uvula rises symmetrically XI: bilateral shoulder shrug XII: midline tongue extension Motor: Right :  Upper extremity   4/5  Left:     Upper extremity   5/5             Lower extremity   2/5              Lower extremity   5/5 Tone and bulk:normal tone throughout; no atrophy noted Sensory: Pinprick and light touch intact throughout, bilaterally Deep Tendon Reflexes: 2+ and symmetric biceps and patella Plantars: Right: downgoing                                Left: downgoing Cerebellar: normal finger-to-nose, normal heel-to-shin test on left side. Unable to perform with left leg Gait: deferred  ASSESSMENT/PLAN Ms. Makayla Thomas is a 84 y.o. female with history of stroke ( 2011/2012), HTN, HLD a. Fib ( xarelto), PAD, Hypothyroidism, dementia who is w/c boun  presenting with right facial droop, slurred speech, right leg weakness.   Stroke:  Small L centrum semiovale and L parietal love infarcts  embolic secondary to known AFib  Code Stroke CT head No acute stroke. Small vessel disease. Atrophy. ASPECTS 10.  MRI  2 small infarcts L CSV and L parietal lobe. Small vessel disease. Atrophy.   MRA head  negative  MRA neck non-dx (noquality)  Repeat CT head no acute finding or change  Carotid Doppler  B ICA 1-39% stenosis, VAs antegrade   2D Echo  EF 60-65%. No source of embolus   LDL 110  HgbA1c 5.2  On Xarelto on admission for VTE prophylaxis - plan change to eliquis  Xarelto (rivaroxaban) daily prior to admission(she used to take an night but more recently has been taking it in the morning with food). She is agreeable to change to eliquis. Will consult pharmacy to enact.  Therapy recommendations:  SNF  Disposition:  pending   Followed by DR[facility] Athar at [facility]. Follow up with her at d/c.  Atrial Fibrillation  Home anticoagulation:  Xarelto (rivaroxaban)  daily continued in the hospital . Elevated INR 10 on admission (not on warfarin) - repeat 1.75, this am 1.25. PT also elevated and has lowered . she used to take Xarelto at night but more recently has been taking it in the morning with food . She is agreeable to change to eliquis. Will consult pharmacy to enact. . Continue Eliquis (apixaban) daily at discharge   Hypertension  Stable . Permissive hypertension (OK if < 220/120) but gradually normalize in 5-7 days . Long-term BP goal normotensive  Hyperlipidemia  Home meds:  No statin  LDL 110, goal < 70  Recommend adding lipitor 10 - has hx unknown allergy to Pitavastatin. Assess allergy prior to starting  Other Stroke Risk Factors  Advanced age  Overweight, Body mass index is 28.23 kg/m., recommend weight loss, diet and exercise as appropriate   Hx stroke/TIA  08/2014 TIA transient word finding difficulties  05/2010 TIA w R leg weakness occurring POD#2 after L femur fx surgery  08/2009 R posterior frontal  Cortex and white matter infarct d/t known AF in setting of  Subtherapeutic INR  Aortic stenosis, mod to severe  PAD  Other Active Problems  Dementia  Hypothyroidism  MS  asymptomatic bacteriuria  Recent Hx L hip and trimalleolar fx  , d/c to SNF for rehab 04/20/2018  Hospital day # 1  Annie Main, MSN, APRN, ANVP-BC, AGPCNP-BC Advanced Practice Stroke Nurse [facility] See Amion for Schedule & Pager information 07/04/2018 3:54 PM   ATTENDING NOTE: I reviewed above note and agree with the assessment and plan. Pt was seen and examined.   84 year old female with history of diabetes, hyperlipidemia, hypertension, PAD, A. fib on Xarelto and TIA admitted for right side leg weakness, slurred speech and facial droop.  She had stroke in 2011 and MRI showed right frontal small cortical infarct.  No residual deficit.  TIA in 08/2014 with transient word finding difficulty, MRI negative, EEG negative.  Has  A. fib on Xarelto PTA, however not 100% sure about medication compliance due to memory impairment.  On exam, patient AAO x2, not orientated to time, visual field full, facial symmetric, tongue midline, still has 4/5 right upper extremity weakness with drift and 2+/5 right lower extremity weakness.  Sensation symmetrical.  Patient mostly wheelchair-bound at home.    MRI showed left CS and left cortical parietal punctate infarcts.  MRA unremarkable.  EF 60 to 65%.  Carotid Doppler unremarkable.  A1c 5.2 and LDL 110.  Patient stroke most likely embolic stroke due to atrial fibrillation although on Xarelto.  Discussed with patient will change Xarelto to Eliquis and educated on medication compliance.  Also started on Lipitor 10 for hyperlipidemia.  She will continue follow-up with Dr. Frances Furbish at [facility].  Neurology will sign off. Please call with questions. Pt will follow up with Dr. Frances Furbish at [facility] in about 4-6 weeks. Thanks for the consult.   Marvel Plan, MD PhD Stroke Neurology 07/05/2018 6:23 AM     To contact Stroke Continuity provider, please refer to [email or web address]. After hours, contact General Neurology

## 2018-07-04 NOTE — Progress Notes (Signed)
Called by nursing staff regarding neuro change in patient.  Makayla Thomas was experiencing new expressive aphasia now. NIHSS 6. Pt is disrupting medical devices and pointing at the closet moaning.  Dr. Amada Jupiter notified and CT Head ordered.  Pt transported to CT in the bed with tele monitor.

## 2018-07-04 NOTE — Progress Notes (Signed)
ANTICOAGULATION CONSULT NOTE  Pharmacy Consult for apixaban Indication: atrial fibrillation and stroke  Allergies  Allergen Reactions  . Tetanus Antitoxin Anaphylaxis    Throat swelling  . Tramadol Other (See Comments)    HALLUCINATIONS; "and paranoia"  . Augmentin [Amoxicillin-Pot Clavulanate]     UNSPECIFIED REACTION   . Codeine     UNSPECIFIED REACTION   . Livalo [Pitavastatin]     UNSPECIFIED REACTION   . Meclizine Other (See Comments)    Had funny feeling    Patient Measurements: Height: 5\' 2"  (157.5 cm) Weight: 154 lb 5.2 oz (70 kg) IBW/kg (Calculated) : 50.1  Labs: Recent Labs    07/03/18 1219 07/03/18 1223 07/03/18 1606 07/04/18 0653  HGB 12.8 12.9  --   --   HCT 41.2 38.0  --   --   PLT 252  --   --   --   APTT 108*  --   --   --   LABPROT >90.0*  --  20.3* 15.6*  INR >10.00*  --  1.75 1.26  CREATININE 0.77 0.70  --   --     Estimated Creatinine Clearance: 42.9 mL/min (by C-G formula based on SCr of 0.7 mg/dL).   Assessment: 84 y/o female on rivaroxaban PTA for hx Afib and hx stroke admitted with new stroke. Pharmacy consulted to change from rivaroxaban to apixaban. Last dose of rivaroxaban was 11/17 - patient was taking in the morning with food. No bleeding noted, CBC is normal.  Full dose appropriate for weight >60 kg and SCr <1.5.  Plan:  Discontinue rivaroxaban Begin apixaban 5 mg PO bid Pharmacy signing off consult but will continue to follow peripherally   Loura Back, PharmD, BCPS Clinical Pharmacist Clinical phone for 07/04/2018 until 10p is [phone] 07/04/2018 6:02 PM  **Pharmacist phone directory can now be found on [email or web address] listed under [facility]**

## 2018-07-04 NOTE — Progress Notes (Signed)
VASCULAR LAB PRELIMINARY  PRELIMINARY  PRELIMINARY  PRELIMINARY  Carotid duplex completed.    Preliminary report:  1-39% ICA plaquing. Vertebral artery flow is antegrade.  Warnie Belair, RVT 07/04/2018, 8:40 AM

## 2018-07-04 NOTE — Progress Notes (Signed)
Speech Language Evaluation  07/04/18 1600  SLP Visit Information  SLP Received On 07/04/18  SLP Time Calculation  SLP Start Time (ACUTE ONLY) 1530  SLP Stop Time (ACUTE ONLY) 1558  SLP Time Calculation (min) (ACUTE ONLY) 28 min  Subjective  Subjective alert, upright in chair at bedside  General Information  HPI 84 y.o. female  With PMH stroke ( 2011/2012), HTN, HLD a. Fib ( xarelto), PAD, Hypothyroidism who presented to [facility] ED as a code stroke from [facility] with c/o right leg weakness and slurred speech. She fell in Aug 2019 sustaining L femur and L ankle fx, undergoing ORIF on both 04/14/18. MRI revealed two small acute infarcts in the left centrum semiovale and left parietal lobe cortex with no hemorrhage or mass effect  Prior Functional Status  Cognitive/Linguistic Baseline Baseline deficits  Baseline deficit details "mild dementia at baseline"  Type of Home Skilled Nursing Facility   Lives With Alone  Available Help at Discharge Skilled Nursing Facility  Vocation Retired  Pain Assessment  Pain Assessment No/denies pain  Oral Motor/Sensory Function  Overall Oral Motor/Sensory Function WFL  Cognition  Overall Cognitive Status Impaired/Different from baseline  Arousal/Alertness Awake/alert  Orientation Level Disoriented to time;Disoriented to situation;Oriented to person;Oriented to place  Attention Sustained  Sustained Attention Impaired  Sustained Attention Impairment Verbal complex;Functional complex  Memory Impaired  Memory Impairment Decreased short term memory  Awareness Impaired  Problem Solving Impaired  Executive Function Organizing;Sequencing  Sequencing Impaired  Organizing Impaired  Safety/Judgment Impaired  Auditory Comprehension  Overall Auditory Comprehension Appears within functional limits for tasks assessed  Yes/No Questions WFL  Expression  Primary Mode of Expression Verbal  Verbal Expression  Overall Verbal Expression Impaired at  baseline (states some mild word finding difficulties at baseline)  Written Expression  Dominant Hand Right  Motor Speech  Overall Motor Speech Other (comment) (appears slightly dysarthric )  Articulation Impaired  Level of Impairment Conversation  Intelligibility Intelligible  SLP - End of Session  Patient left in chair;with call bell/phone within reach  Nurse Communication Cognitive/Linguistic strategies reviewed  Assessment  Clinical Impression Statement (ACUTE ONLY) Mild cognitive linguistic deficits were exhibited, difficult to determine if worse from baseline. Pt with a hx of mild dementia.  Since onset of  acute CVA she states she feels that her speech has returned to baseline. She noted periods of slurred speech during onset. A minimal dysarthria was exhibited with reduced articulation during certain sound production, however pt remained fully intelligible during communication interactions. Cognitive deficits noted include reduced executive function skills, reduced thought organization, reduced novel recall of information, and decreased problem solving. Pt stated some intermittent word finding difficulties prior acute CVA, mild anomia of speech exhibited during interactions this date. SLP will follow up for to ensure cognitive linguistic skills return to baseline.   SLP Recommendation/Assessment Patient needs continued Speech Lanaguage Pathology Services  SLP Visit Diagnosis Cognitive communication deficit [phone])  Problem List Memory;Problem Solving;Orientation;Attention;Executive Functioning;Thought organization  Plan  Speech Therapy Frequency (ACUTE ONLY) min 1 x/week  Duration 1 week  Treatment/Interventions Compensatory strategies;Patient/family education;SLP instruction and feedback;Cognitive reorganization;Environmental controls  Potential to Achieve Goals (ACUTE ONLY) Good  Potential Considerations (ACUTE ONLY) Previous level of function;Family/community  support;Cooperation/participation level  SLP Recommendations  Follow up Recommendations Skilled Nursing facility  Individuals Consulted  Consulted and Agree with Results and Recommendations Patient  SLP Evaluations  $ SLP Speech Visit 1 Visit  SLP Evaluations  $ SLP EVAL LANGUAGE/SOUND PRODUCTION 1 Procedure   [facility]  Nicasio Barlowe MA, CCC-SLP  Acute Rehab Warehouse manager

## 2018-07-04 NOTE — Progress Notes (Signed)
  Echocardiogram 2D Echocardiogram has been performed.  Shakesha Soltau L Androw 07/04/2018, 8:45 AM

## 2018-07-04 NOTE — Evaluation (Signed)
Occupational Therapy Evaluation Patient Details Name: Makayla Thomas MRN: [id number] DOB: [birth year] Today's Date: 07/04/2018    History of Present Illness 84 y.o. female  With PMH stroke ( 2011/2012), HTN, HLD a. Fib ( xarelto), PAD, Hypothyroidism who presented to [facility] ED as a code stroke from [facility] with c/o right leg weakness and slurred speech. She fell in Aug 2019 sustaining L femur and L ankle fx, undergoing ORIF on both 04/14/18. MRI revealed two small acute infarcts in the left centrum semiovale and left parietal lobe cortex with no hemorrhage or mass effect.     Patient presenting with decreased I in self care, balance, functional mobility, safety awareness, coordination, cognition, and safety awareness. Patient with recent fall 2019 with ORIF of L femur and ankle and has been at [facility] for 2 months receiving therapy. Pt recently admitted to ALF prior to stroke. Pt requires assist with self care prior, ambulation less than 67' with RW and assist, mostly in wheelchair during the day per family.  Patient currently functioning at mod - max A for self care, mod A sit <>stand, mod A stand pivot transfer. Patient will benefit from acute OT to increase overall independence in the areas of ADLs, functional mobility, and safety in order to safely discharge to next venue of care.  Follow Up Recommendations  SNF    Equipment Recommendations  Other (comment)(defer to next venue of care)    Recommendations for Other Services       Precautions / Restrictions Precautions Precautions: Fall Restrictions Weight Bearing Restrictions: No      Mobility Bed Mobility    General bed mobility comments: seated in recliner chair  Transfers Overall transfer level: Needs assistance   Transfers: Sit to/from Stand;Stand Pivot Transfers Sit to Stand: Mod assist Stand pivot transfers: Mod assist       General transfer comment: mod lifting assistance to stand from recliner chair with increased  time and mod cuing for hand placement    Balance Overall balance assessment: Needs assistance Sitting-balance support: No upper extremity supported;Feet supported Sitting balance-Leahy Scale: Good     Standing balance support: Single extremity supported;During functional activity Standing balance-Leahy Scale: Poor Standing balance comment: reliant on external support       ADL either performed or assessed with clinical judgement   ADL Overall ADL's : Needs assistance/impaired Eating/Feeding: Minimal assistance;Sitting   Grooming: Moderate assistance   Upper Body Bathing: Minimal assistance   Lower Body Bathing: Maximal assistance   Upper Body Dressing : Minimal assistance   Lower Body Dressing: Maximal assistance   Toilet Transfer: Maximal assistance   Toileting- Clothing Manipulation and Hygiene: Maximal assistance               Vision Baseline Vision/History: Wears glasses Wears Glasses: At all times Patient Visual Report: No change from baseline              Pertinent Vitals/Pain Pain Assessment: No/denies pain     Hand Dominance Right   Extremity/Trunk Assessment Upper Extremity Assessment Upper Extremity Assessment: RUE deficits/detail RUE Deficits / Details: 3-/5 RUE Sensation: WNL RUE Coordination: decreased fine motor   Lower Extremity Assessment Lower Extremity Assessment: Defer to PT evaluation       Communication Communication Communication: Expressive difficulties   Cognition Arousal/Alertness: Awake/alert Behavior During Therapy: WFL for tasks assessed/performed Overall Cognitive Status: Impaired/Different from baseline Area of Impairment: Safety/judgement;Problem solving   Safety/Judgement: Decreased awareness of deficits;Decreased awareness of safety     General Comments: Disoriented  to time. Follows simple commands consistently. Decreased safety. Word finding difficulty.               Home Living Family/patient expects  to be discharged to:: Assisted living      Home Equipment: Dan Humphreys - 2 wheels;Cane - single point;Shower seat;Grab bars - toilet;Wheelchair - manual          Prior Functioning/Environment Level of Independence: Needs assistance  Gait / Transfers Assistance Needed: mod I mobility at w/c level ADL's / Homemaking Assistance Needed: assist with bathing per caregivers   Comments: family members present and report pt mostly wheelchair level and ambulates 30-60 feet with RW with assist from staff.         OT Problem List: Decreased strength;Decreased range of motion;Decreased coordination;Decreased activity tolerance;Decreased cognition;Impaired balance (sitting and/or standing);Decreased safety awareness;Impaired UE functional use      OT Treatment/Interventions: Self-care/ADL training;Balance training;Therapeutic exercise;Therapeutic activities;Energy conservation;Cognitive remediation/compensation;Patient/family education;DME and/or AE instruction    OT Goals(Current goals can be found in the care plan section) Acute Rehab OT Goals Patient Stated Goal: to get better OT Goal Formulation: With patient/family Time For Goal Achievement: 07/18/18 Potential to Achieve Goals: Good ADL Goals Pt Will Perform Grooming: with set-up;with supervision Pt Will Perform Upper Body Bathing: with set-up;with supervision Pt Will Perform Lower Body Bathing: with min assist Pt Will Perform Upper Body Dressing: with supervision;with set-up Pt Will Perform Lower Body Dressing: with min assist Pt Will Transfer to Toilet: with min assist Pt Will Perform Toileting - Clothing Manipulation and hygiene: with min assist  OT Frequency: Min 2X/week   Barriers to D/C: Other (comment)  none known at this time          AM-PAC PT "6 Clicks" Daily Activity     Outcome Measure Help from another person eating meals?: A Lot Help from another person taking care of personal grooming?: A Lot Help from another person  toileting, which includes using toliet, bedpan, or urinal?: Total Help from another person bathing (including washing, rinsing, drying)?: Total Help from another person to put on and taking off regular upper body clothing?: A Lot Help from another person to put on and taking off regular lower body clothing?: Total 6 Click Score: 9   End of Session Equipment Utilized During Treatment: Rolling walker  Activity Tolerance: Patient limited by fatigue Patient left: in chair;with call bell/phone within reach;with chair alarm set;with family/visitor present  OT Visit Diagnosis: Hemiplegia and hemiparesis Hemiplegia - Right/Left: Right Hemiplegia - dominant/non-dominant: Dominant Hemiplegia - caused by: Cerebral infarction                Time: 1349-1416 OT Time Calculation (min): 27 min Charges:  OT General Charges $OT Visit: 1 Visit OT Evaluation $OT Eval Low Complexity: 1 Low OT Treatments $Therapeutic Activity: 8-22 mins  Grisela Mesch P, MS, OTR/L 07/04/2018, 2:29 PM

## 2018-07-04 NOTE — Progress Notes (Signed)
Pt was unable to get her words out, confused, agitated and hallucinating, neurologist notified, haldol and CT head ordered.

## 2018-07-04 NOTE — Evaluation (Signed)
Physical Therapy Evaluation Patient Details Name: Makayla Thomas MRN: [id number] DOB: 10-Sep-1925 Today's Date: 07/04/2018   History of Present Illness  84 y.o. female  With PMH stroke ( 2011/2012), HTN, HLD a. Fib ( xarelto), PAD, Hypothyroidism who presented to [facility] ED as a code stroke from [facility] with c/o right leg weakness and slurred speech. She fell in Aug 2019 sustaining L femur and L ankle fx, undergoing ORIF on both 04/14/18. MRI revealed two small acute infarcts in the left centrum semiovale and left parietal lobe cortex with no hemorrhage or mass effect.     Clinical Impression  Pt admitted with above diagnosis. Pt currently with functional limitations due to the deficits listed below (see PT Problem List). On eval, pt required min assist bed mobility, mod assist sit to stand and min assist SPT. Pt is wheelchair bound at baseline. Pt will benefit from skilled PT to increase their independence and safety with mobility to allow discharge to the venue listed below.  Recommending SNF at d/c. Pt able to return to ALF with HHPT if staff there able to provide needed level of assist.      Follow Up Recommendations SNF    Equipment Recommendations  None recommended by PT    Recommendations for Other Services       Precautions / Restrictions Precautions Precautions: Fall      Mobility  Bed Mobility Overal bed mobility: Needs Assistance Bed Mobility: Supine to Sit     Supine to sit: Min assist;HOB elevated     General bed mobility comments: +rail, assist to scoot to EOB  Transfers Overall transfer level: Needs assistance Equipment used: None Transfers: Sit to/from [employer] Sit to Stand: Mod assist Stand pivot transfers: Min assist       General transfer comment: mod assist to power up from EOB, increased time to stabilize initial standing balance, pivot steps toward left bed to recliner, assist to control descent   Ambulation/Gait              General Gait Details: unable  Stairs            Wheelchair Mobility    Modified Rankin (Stroke Patients Only) Modified Rankin (Stroke Patients Only) Pre-Morbid Rankin Score: Slight disability Modified Rankin: Moderately severe disability     Balance Overall balance assessment: Needs assistance Sitting-balance support: No upper extremity supported;Feet supported Sitting balance-Leahy Scale: Good     Standing balance support: Single extremity supported;During functional activity Standing balance-Leahy Scale: Poor Standing balance comment: reliant on external support                             Pertinent Vitals/Pain Pain Assessment: No/denies pain    Home Living Family/patient expects to be discharged to:: Assisted living               Home Equipment: Walker - 2 wheels;Cane - single point;Shower seat;Grab bars - toilet;Wheelchair - manual      Prior Function Level of Independence: Needs assistance   Gait / Transfers Assistance Needed: mod I mobility at w/c level  ADL's / Homemaking Assistance Needed: assist with bathing per pt  Comments: history provided by pt. Pt presenting with some confusion, unsure of validity.     Hand Dominance   Dominant Hand: Right    Extremity/Trunk Assessment   Upper Extremity Assessment Upper Extremity Assessment: Defer to OT evaluation    Lower Extremity Assessment Lower Extremity Assessment:  RLE deficits/detail RLE Deficits / Details: 2+/5 RLE Sensation: WNL RLE Coordination: decreased fine motor;decreased gross motor    Cervical / Trunk Assessment Cervical / Trunk Assessment: Kyphotic  Communication   Communication: Expressive difficulties  Cognition Arousal/Alertness: Awake/alert Behavior During Therapy: WFL for tasks assessed/performed Overall Cognitive Status: No family/caregiver present to determine baseline cognitive functioning                                 General  Comments: Disoriented to time. Follows simple commands consistently. Decreased safety. Word finding difficulty.       General Comments      Exercises     Assessment/Plan    PT Assessment Patient needs continued PT services  PT Problem List Decreased strength;Decreased mobility;Decreased safety awareness;Decreased knowledge of precautions;Decreased coordination;Decreased balance       PT Treatment Interventions Therapeutic activities;Therapeutic exercise;Patient/family education;Gait training;Balance training;Functional mobility training    PT Goals (Current goals can be found in the Care Plan section)  Acute Rehab PT Goals Patient Stated Goal: not stated PT Goal Formulation: With patient Time For Goal Achievement: 07/18/18 Potential to Achieve Goals: Good    Frequency Min 3X/week   Barriers to discharge        Co-evaluation               AM-PAC PT "6 Clicks" Daily Activity  Outcome Measure Difficulty turning over in bed (including adjusting bedclothes, sheets and blankets)?: A Lot Difficulty moving from lying on back to sitting on the side of the bed? : A Lot Difficulty sitting down on and standing up from a chair with arms (e.g., wheelchair, bedside commode, etc,.)?: Unable Help needed moving to and from a bed to chair (including a wheelchair)?: A Little Help needed walking in hospital room?: A Lot Help needed climbing 3-5 steps with a railing? : Total 6 Click Score: 11    End of Session Equipment Utilized During Treatment: Gait belt Activity Tolerance: Patient tolerated treatment well Patient left: in chair;with call bell/phone within reach;with chair alarm set Nurse Communication: Mobility status PT Visit Diagnosis: Other abnormalities of gait and mobility (R26.89)    Time: 9326-7124 PT Time Calculation (min) (ACUTE ONLY): 18 min   Charges:   PT Evaluation $PT Eval Moderate Complexity: 1 Mod          Aida Raider, PT  Office # [phone] Pager  [phone]   Ilda Foil 07/04/2018, 10:33 AM

## 2018-07-04 NOTE — Progress Notes (Signed)
[facility] PROGRESS NOTE  Mychal Durio ZOX:[id number] DOB: [birth year] DOA: 07/03/2018 PCP: Merri Brunette, MD  Assessment/Plan:  STROKE CT negative. MRI brain two small acute infarcts in the left centrum semiovale and left parietal lobe cortex with no hemorrhage or mass effect. Carotid doppler preliminary report with 1-39% ICA plaquing. Echo with moderate LVH and EF 60%, moderate to sever aortic stenosis, sever LAE and moderated RAE and moderate pulmonary hypertension. Lipid panel with LDL 110 otherwise unremarkable. BP fair control. PT recommending snf. Currently assisted living with wheelchair baseline. --ASA -- pt with allergy to statin -- HgbA1c, fasting lipid panel -- Speech consult --Telemetry monitoring --Frequent neuro checks    ELEVATED INR NOT ON COUMADIN Repeat lab within limits of normal  ATRIAL FIBRILLATION. Chadvasc score 5. Rated controlled. Home meds include xarleto and bisoprolol Continue bisoprolol for rate management -resume zarelto  HYPERTENSION. Fair contorl Continue bisoprolol  ASYMPTOMATIC BACTERIURIA Patient is apparently on trimethoprim 100 mg daily for bacteria. Patient specifically denies any dysuria, urgency, frequency or suprapubic discomfort.  -Will continue trimethoprim at suppressive doses per home medication -monitor intake and output  AORTIC STENOSIS Appears to be compensated at present. See echo results  DEMENTIA No official diagnosis but patient does have some short-term memory loss. She is otherwise delightful and provides a good history. Initiation of Aricept versus memantine can be done as an outpatient as warranted.  Delirium: this am patient awakened anxious, agitated confused per nursing note. CT obtained for acute neuro change and this was negative for any acute change from yesterday. She is afebrile and hemodynamically stable and not hypoxic. No metabolic derangement. Reports seeing "moving spots on ceiling" this am.   -obtain folate, B12 and rpr -iv fluids -evaluated meds  HYPOTHYROIDISM Continue Synthroid  STATUS POST LEFT HIP AND TRIMALLEOLAR FRACTURE Recently discharged 04/20/2018  Code Status: dnr Family Communication: none present Disposition Plan: back to facility may need higher level of care   Consultants:  ar[facility] neurology  Procedures:  Echo  Carotid dopplear  Antibiotics:  Home meds include trimethoprim  HPI/Subjective: Lying in bed awake alert oriented to self only. Slightly anxious and confused. Reporting spots on walls and being in "outer space".   Admitted for facial droop slurred speech. MRI positive. This am with delirium  Objective: Vitals:   07/04/18 0732 07/04/18 1114  BP: (!) 156/72 (!) 153/79  Pulse: 60 66  Resp: 20 19  Temp: (!) 97.4 F (36.3 C) (!) 97.5 F (36.4 C)  SpO2: 99% 97%    Intake/Output Summary (Last 24 hours) at 07/04/2018 1250 Last data filed at 07/04/2018 1114 Gross per 24 hour  Intake -  Output 550 ml  Net -550 ml   Filed Weights   07/03/18 1252 07/03/18 1554  Weight: 72.6 kg 70 kg    Exam:   General:  Pale frail mildly anxious quite delerius  Cardiovascular: irregularly irregular no mgr no LE edema  Respiratory: normal effort BS clear bilaterally no wheeze  Abdomen: soft +BS no guarding or rebounding  Musculoskeletal: speech clear oriented to self only. Bilateral grip 5/5   Data Reviewed: Basic Metabolic Panel: Recent Labs  Lab 07/03/18 1219 07/03/18 1223  NA 140 141  K 3.5 3.5  CL 109 107  CO2 23  --   GLUCOSE 113* 108*  BUN 13 15  CREATININE 0.77 0.70  CALCIUM 8.9  --    Liver Function Tests: Recent Labs  Lab 07/03/18 1219  AST 21  ALT 13  ALKPHOS 163*  BILITOT 0.6  PROT 6.3*  ALBUMIN 3.8   No results for input(s): LIPASE, AMYLASE in the last 168 hours. No results for input(s): AMMONIA in the last 168 hours. CBC: Recent Labs  Lab 07/03/18 1219 07/03/18 1223  WBC 7.9  --   NEUTROABS  4.8  --   HGB 12.8 12.9  HCT 41.2 38.0  MCV 94.7  --   PLT 252  --    Cardiac Enzymes: No results for input(s): CKTOTAL, CKMB, CKMBINDEX, TROPONINI in the last 168 hours. BNP (last 3 results) No results for input(s): BNP in the last 8760 hours.  ProBNP (last 3 results) No results for input(s): PROBNP in the last 8760 hours.  CBG: Recent Labs  Lab 07/04/18 0559  GLUCAP 98    No results found for this or any previous visit (from the past 240 hour(s)).   Studies: Ct Head Wo Contrast  Result Date: 07/04/2018 CLINICAL DATA:  Neuro change with speech difficulty EXAM: CT HEAD WITHOUT CONTRAST TECHNIQUE: Contiguous axial images were obtained from the base of the skull through the vertex without intravenous contrast. COMPARISON:  Brain MRI from yesterday.  Head CT yesterday. FINDINGS: Brain: 2 known small acute infarcts by MRI are not detected on this CT. There is generalized atrophy and chronic small vessel ischemia with confluent gliosis in the cerebral white matter. No hemorrhage, hydrocephalus, or collection. Vascular: Atherosclerotic calcification.  No hyperdense vessel Skull: Negative Sinuses/Orbits: Bilateral cataract resection IMPRESSION: No acute finding or change from yesterday. Electronically Signed   By: Marnee Spring M.D.   On: 07/04/2018 07:55   Mr Brain Wo Contrast  Result Date: 07/03/2018 CLINICAL DATA:  84 year old female with right leg weakness, facial droop and slurred speech. EXAM: MRI HEAD WITHOUT CONTRAST MRA HEAD WITHOUT CONTRAST TECHNIQUE: Multiplanar, multiecho pulse sequences of the brain and surrounding structures were obtained without intravenous contrast. Angiographic images of the head were obtained using MRA technique without contrast. CONTRAST:  7 milliliters of Gadavist was administered for attempted Neck MRA, but for unclear technical reasons that study was nondiagnostic (both the time-of-flight and post-contrast portions) and is not being charged. No  postcontrast imaging of the brain was requested or obtained. COMPARISON:  Head CT without contrast earlier today. Brain MRI 04/19/2018. FINDINGS: MRI HEAD FINDINGS Brain: There are 2 small foci of restricted diffusion identified. The larger measuring 8 millimeters is in the left centrum semiovale on series 3, image 37. There is a smaller cortical area of restricted diffusion along the posterolateral left lower parietal lobe on series 3, image 30. No other restricted diffusion identified. No associated acute hemorrhage or mass effect. Underlying severe widespread T2 and FLAIR signal hyperintensity in the cerebral white matter and throughout the deep gray matter nuclei stable since September. Comparatively mild for age T2 heterogeneity in the pons and there are 1 or 2 unchanged tiny chronic lacunar infarcts in the cerebellum (series 8, image 11). Occasional scattered chronic microhemorrhages are stable, including those in both thalami and the central pons No midline shift, mass effect, evidence of mass lesion, ventriculomegaly, extra-axial collection or acute intracranial hemorrhage. Cervicomedullary junction and pituitary are within normal limits. Vascular: Major intracranial vascular flow voids are stable since September. Skull and upper cervical spine: Negative for age visible cervical spine. Normal bone marrow signal. Sinuses/Orbits: Stable and negative. Other: Mastoids remain clear. Visible internal auditory structures appear normal. Scalp and face soft tissues appear negative. MRA HEAD FINDINGS Antegrade flow in the distal vertebral arteries. The right vertebral appears to functionally terminates in PICA.  The left PICA is patent. The distal left vertebral supplies the basilar without definite stenosis. The basilar artery is diminutive on the basis of fetal type bilateral PCA origins. Both SCA origins are patent. Bilateral posterior communicating arteries and PCA branches are patent with no proximal stenosis.  Antegrade flow in both ICA siphons. Bilateral siphon irregularity without stenosis. Normal ophthalmic and posterior communicating artery origins. Patent carotid termini. Normal MCA and ACA origins. The left A1 is dominant and the right is diminutive. The anterior communicating artery and visible ACA branches are within normal limits. MCA M1 segments and MCA bi/trifurcations are patent. No proximal MCA branch stenosis. IMPRESSION: 1. There are two small acute infarcts in the left centrum semiovale and left parietal lobe cortex with no hemorrhage or mass effect. 2. No other acute intracranial abnormality. Underlying advanced signal changes in the brain compatible with chronic small vessel disease. 3. Intracranial MRA is negative for large vessel occlusion or proximal branch stenosis. 4. Neck MRA was attempted with 7 mL IV Gadavist but for unclear technical reasons was non-diagnostic and is not being charged. Electronically Signed   By: Odessa Fleming M.D.   On: 07/03/2018 18:26   Mr Maxine Glenn Head Wo Contrast  Result Date: 07/03/2018 CLINICAL DATA:  84 year old female with right leg weakness, facial droop and slurred speech. EXAM: MRI HEAD WITHOUT CONTRAST MRA HEAD WITHOUT CONTRAST TECHNIQUE: Multiplanar, multiecho pulse sequences of the brain and surrounding structures were obtained without intravenous contrast. Angiographic images of the head were obtained using MRA technique without contrast. CONTRAST:  7 milliliters of Gadavist was administered for attempted Neck MRA, but for unclear technical reasons that study was nondiagnostic (both the time-of-flight and post-contrast portions) and is not being charged. No postcontrast imaging of the brain was requested or obtained. COMPARISON:  Head CT without contrast earlier today. Brain MRI 04/19/2018. FINDINGS: MRI HEAD FINDINGS Brain: There are 2 small foci of restricted diffusion identified. The larger measuring 8 millimeters is in the left centrum semiovale on series 3, image  37. There is a smaller cortical area of restricted diffusion along the posterolateral left lower parietal lobe on series 3, image 30. No other restricted diffusion identified. No associated acute hemorrhage or mass effect. Underlying severe widespread T2 and FLAIR signal hyperintensity in the cerebral white matter and throughout the deep gray matter nuclei stable since September. Comparatively mild for age T2 heterogeneity in the pons and there are 1 or 2 unchanged tiny chronic lacunar infarcts in the cerebellum (series 8, image 11). Occasional scattered chronic microhemorrhages are stable, including those in both thalami and the central pons No midline shift, mass effect, evidence of mass lesion, ventriculomegaly, extra-axial collection or acute intracranial hemorrhage. Cervicomedullary junction and pituitary are within normal limits. Vascular: Major intracranial vascular flow voids are stable since September. Skull and upper cervical spine: Negative for age visible cervical spine. Normal bone marrow signal. Sinuses/Orbits: Stable and negative. Other: Mastoids remain clear. Visible internal auditory structures appear normal. Scalp and face soft tissues appear negative. MRA HEAD FINDINGS Antegrade flow in the distal vertebral arteries. The right vertebral appears to functionally terminates in PICA. The left PICA is patent. The distal left vertebral supplies the basilar without definite stenosis. The basilar artery is diminutive on the basis of fetal type bilateral PCA origins. Both SCA origins are patent. Bilateral posterior communicating arteries and PCA branches are patent with no proximal stenosis. Antegrade flow in both ICA siphons. Bilateral siphon irregularity without stenosis. Normal ophthalmic and posterior communicating artery origins. Patent  carotid termini. Normal MCA and ACA origins. The left A1 is dominant and the right is diminutive. The anterior communicating artery and visible ACA branches are within  normal limits. MCA M1 segments and MCA bi/trifurcations are patent. No proximal MCA branch stenosis. IMPRESSION: 1. There are two small acute infarcts in the left centrum semiovale and left parietal lobe cortex with no hemorrhage or mass effect. 2. No other acute intracranial abnormality. Underlying advanced signal changes in the brain compatible with chronic small vessel disease. 3. Intracranial MRA is negative for large vessel occlusion or proximal branch stenosis. 4. Neck MRA was attempted with 7 mL IV Gadavist but for unclear technical reasons was non-diagnostic and is not being charged. Electronically Signed   By: Odessa Fleming M.D.   On: 07/03/2018 18:26   Ct Head Code Stroke Wo Contrast  Result Date: 07/03/2018 CLINICAL DATA:  Code stroke. Right leg weakness, facial droop, and slurred speech. EXAM: CT HEAD WITHOUT CONTRAST TECHNIQUE: Contiguous axial images were obtained from the base of the skull through the vertex without intravenous contrast. COMPARISON:  Brain MRI 04/19/2018 and CT 04/13/2018 FINDINGS: Brain: There is no evidence of acute infarct, intracranial hemorrhage, mass, midline shift, or extra-axial fluid collection. Patchy to confluent hypodensities are again seen in the cerebral white matter bilaterally, similar to the prior CT and compatible with moderate chronic small vessel ischemic disease. Small chronic cerebellar infarcts on MRI are not well demonstrated by CT. Heterogeneous hypoattenuation in the basal ganglia corresponds to dilated perivascular spaces and right-sided lacunar infarcts on MRI. There is moderate cerebral atrophy. Vascular: Calcified atherosclerosis at the skull base. No hyperdense vessel. Skull: No fracture or focal osseous lesion. Sinuses/Orbits: Visualized paranasal sinuses and mastoid air cells are clear. Bilateral cataract extraction is noted. Other: None. ASPECTS [facility] Stroke Program Early CT Score) - Ganglionic level infarction (caudate, lentiform nuclei, internal  capsule, insula, M1-M3 cortex): 7 - Supraganglionic infarction (M4-M6 cortex): 3 Total score (0-10 with 10 being normal): 10 IMPRESSION: 1. No evidence of acute intracranial abnormality. 2. ASPECTS is 10. 3. Moderate chronic small vessel ischemic disease and cerebral atrophy. These results were communicated to Dr. Laurence Slate at 12:35 pm on 07/03/2018 by text page via the [facility] messaging system. Electronically Signed   By: Sebastian Ache M.D.   On: 07/03/2018 12:36    Scheduled Meds: . bisoprolol  10 mg Oral Daily  . docusate sodium  100 mg Oral BID  . haloperidol lactate      . levothyroxine  125 mcg Oral QAC breakfast  . senna  1 tablet Oral Daily  . trimethoprim  100 mg Oral Daily   Continuous Infusions: . sodium chloride 50 mL/hr at 07/03/18 1903    Principal Problem:   Stroke [facility]) Active Problems:   Permanent atrial fibrillation (HCC); CHA2DS2-VASc Score 6   Delirium   Moderate aortic stenosis   Essential hypertension -well controlled   Dementia (HCC)   Supratherapeutic INR    Time spent: 45 minutes    [facility] M NP [facility]  If 7PM-7AM, please contact night-coverage at [email or web address], password [facility] 07/04/2018, 12:50 PM  LOS: 1 day

## 2018-07-05 DIAGNOSIS — G459 Transient cerebral ischemic attack, unspecified: Secondary | ICD-10-CM

## 2018-07-05 DIAGNOSIS — F0391 Unspecified dementia with behavioral disturbance: Secondary | ICD-10-CM

## 2018-07-05 DIAGNOSIS — I350 Nonrheumatic aortic (valve) stenosis: Secondary | ICD-10-CM

## 2018-07-05 LAB — FOLATE RBC
FOLATE, HEMOLYSATE: 413.5 ng/mL
Folate, RBC: 1052 ng/mL (ref >498)
HEMATOCRIT: 39.3 % (ref 34.0–46.6)

## 2018-07-05 LAB — RPR: RPR Ser Ql: NONREACTIVE

## 2018-07-05 MED ORDER — QUETIAPINE FUMARATE 25 MG PO TABS
12.5000 mg | ORAL_TABLET | Freq: Every day | ORAL | Status: DC
  Administered 2018-07-05: 12.5 mg via ORAL
  Filled 2018-07-05: qty 1

## 2018-07-05 MED ORDER — INFLUENZA VAC SPLIT HIGH-DOSE 0.5 ML IM SUSY
0.5000 mL | PREFILLED_SYRINGE | INTRAMUSCULAR | Status: DC
  Filled 2018-07-05: qty 0.5

## 2018-07-05 MED ORDER — ATORVASTATIN CALCIUM 10 MG PO TABS
10.0000 mg | ORAL_TABLET | Freq: Every day | ORAL | Status: DC
  Administered 2018-07-05: 10 mg via ORAL
  Filled 2018-07-05: qty 1

## 2018-07-05 MED ORDER — CYANOCOBALAMIN 1000 MCG/ML IJ SOLN
1000.0000 ug | Freq: Every day | INTRAMUSCULAR | Status: DC
  Administered 2018-07-05 – 2018-07-06 (×2): 1000 ug via SUBCUTANEOUS
  Filled 2018-07-05 (×2): qty 1

## 2018-07-05 NOTE — NC FL2 (Signed)
[employer] MEDICAID FL2 LEVEL OF CARE SCREENING TOOL     IDENTIFICATION  Patient Name: Makayla Thomas Birthdate: 07/30/1926 Sex: female Admission Date (Current Location): 07/03/2018  [facility] and [place] Number:  Producer, television/film/video and Address:  [facility] [place], [address], [place], [place] [address]      Provider Number: [id number]  Attending Physician Name and Address:  Calvert Cantor, MD  Relative Name and Phone Number:  Nayelee Velic, [phone] Rexene Edison), [phone] (C)    Current Level of Care: Hospital Recommended Level of Care: Assisted Living Facility Prior Approval Number:    Date Approved/Denied:   PASRR Number:    Discharge Plan: Other (Comment)(ALF)    Current Diagnoses: Patient Active Problem List   Diagnosis Date Noted  . Delirium 07/04/2018  . Supratherapeutic INR 07/03/2018  . Supracondylar fracture of left femur, closed, initial encounter (HCC) 04/13/2018  . Trimalleolar fracture of ankle, closed, left, initial encounter 04/13/2018  . CKD (chronic kidney disease) stage 3, GFR 30-59 ml/min (HCC) 04/13/2018  . Hyperglycemia 04/13/2018  . Dementia (HCC) 04/13/2018  . Periprosthetic fracture around internal prosthetic right hip joint (HCC) 02/05/2015  . Hypothyroidism 02/05/2015  . Obese 01/30/2015  . S/P right THA, AA 01/29/2015  . Rectal bleeding   . Anticoagulation adequate   . Stroke (HCC)   . Dehydration 08/29/2014  . Nausea and vomiting 08/29/2014  . Diarrhea 08/29/2014  . Hypokalemia 08/29/2014  . Acute renal failure (HCC) 08/29/2014  . Leukocytosis 08/29/2014  . History of TIA (transient ischemic attack) 08/18/2014  . Essential hypertension -well controlled 01/10/2013  . Permanent atrial fibrillation (HCC); CHA2DS2-VASc Score 6   . Chronic anticoagulation   . Dyslipidemia, goal LDL below 100 - due aortic stenosis   . Moderate aortic stenosis 08/17/2009    Orientation RESPIRATION BLADDER Height & Weight      Self  Normal Incontinent Weight: 154 lb 5.2 oz (70 kg) Height:  5\' 2"  (157.5 cm)  BEHAVIORAL SYMPTOMS/MOOD NEUROLOGICAL BOWEL NUTRITION STATUS      Continent Diet(Cardiac)  AMBULATORY STATUS COMMUNICATION OF NEEDS Skin   Limited Assist Verbally Normal                       Personal Care Assistance Level of Assistance  Dressing, Feeding, Bathing Bathing Assistance: Limited assistance Feeding assistance: Limited assistance Dressing Assistance: Limited assistance     Functional Limitations Info  Hearing, Speech, Sight Sight Info: Adequate Hearing Info: Impaired(Impaired in both ears) Speech Info: Adequate    SPECIAL CARE FACTORS FREQUENCY  PT (By licensed PT), OT (By licensed OT), Speech therapy     PT Frequency: 3x WK with Home Health  OT Frequency: 3x WK with Home Health      Speech Therapy Frequency: 3x WK with Home Health       Contractures Contractures Info: Not present    Additional Factors Info  Allergies, Code Status Code Status Info: DNR Allergies Info:  Tetanus Antitoxin, Tramadol, Augmentin Amoxicillin-pot Clavulanate, Codeine, Livalo Pitavastatin, Meclizine           Current Medications (07/05/2018):  This is the current hospital active medication list Current Facility-Administered Medications  Medication Dose Route Frequency Provider Last Rate Last Dose  . 0.9 %  sodium chloride infusion   Intravenous Continuous Pieter Partridge, MD 50 mL/hr at 07/03/18 1903    . acetaminophen (TYLENOL) tablet 650 mg  650 mg Oral Q6H PRN Pieter Partridge, MD   650 mg at  07/03/18 2153  . apixaban (ELIQUIS) tablet 5 mg  5 mg Oral BID Ilda Basset, [place]   5 mg at 07/05/18 5284  . atorvastatin (LIPITOR) tablet 10 mg  10 mg Oral q1800 Rizwan, Ladell Heads, MD      . bisoprolol (ZEBETA) tablet 10 mg  10 mg Oral Daily Leandro Reasoner Tublu, MD   10 mg at 07/05/18 0908  . cyanocobalamin ((VITAMIN B-12)) injection 1,000 mcg  1,000 mcg Subcutaneous Daily  Calvert Cantor, MD   1,000 mcg at 07/05/18 1147  . docusate sodium (COLACE) capsule 100 mg  100 mg Oral BID Leandro Reasoner Tublu, MD   100 mg at 07/05/18 0909  . feeding supplement (ENSURE ENLIVE) (ENSURE ENLIVE) liquid 237 mL  237 mL Oral BID BM Amin, Ankit Chirag, MD   237 mL at 07/05/18 0911  . gadobutrol (GADAVIST) 1 MMOL/ML injection 7 mL  7 mL Intravenous Once PRN Pieter Partridge, MD      . Melene Muller ON 07/06/2018] Influenza vac split quadrivalent PF (FLUZONE HIGH-DOSE) injection 0.5 mL  0.5 mL Intramuscular Tomorrow-1000 Rizwan, Saima, MD      . levothyroxine (SYNTHROID, LEVOTHROID) tablet 125 mcg  125 mcg Oral QAC breakfast Leandro Reasoner Tublu, MD      . QUEtiapine (SEROQUEL) tablet 12.5 mg  12.5 mg Oral QHS Rizwan, Ladell Heads, MD      . senna (SENOKOT) tablet 8.6 mg  1 tablet Oral Daily Leandro Reasoner Tublu, MD   8.6 mg at 07/05/18 0908  . trimethoprim (TRIMPEX) tablet 100 mg  100 mg Oral Daily Leandro Reasoner Tublu, MD   100 mg at 07/05/18 0908     Discharge Medications: Please see discharge summary for a list of discharge medications.  Relevant Imaging Results:  Relevant Lab Results:   Additional Information [id number]  Nada Boozer Adaora Mchaney, LCSWA

## 2018-07-05 NOTE — Progress Notes (Signed)
PROGRESS NOTE    Makayla Thomas   ZOX:[id number]  DOB: Sep 24, 1925  DOA: 07/03/2018 PCP: Merri Brunette, MD   Brief Narrative:  Makayla Thomas 84 y.o. female from ALF who is wheelchair bound, and has a h/o hypertension, atrial fibrillation on Xarelto, mild dementia who was in her usual state of health at her SNF until this morning when she noted that her forget her knife were dropping out of her right hand. She had earlier noted difficulty moving her legs as she tried to paddle herself in her wheelchair down the hallway. She noted that her right leg kept on getting caught.she was also noted to have some slurring of speech and patient was brought to the ED with a code stroke.  In ED> INR is found to be 10   Subjective: Pleasantly confused without complaints.     Assessment & Plan:   Principal Problem:   Stroke in setting of A-fib - MRI: two small acute infarcts in the left centrum semiovale and left parietal lobe - appreciate neuro eval -Carotid Doppler  B ICA 1-39% stenosis, VAs antegrade  -2D Echo  EF 60-65%. No source of embolus  -LDL 110 -HgbA1c 5.2 - Xarelto switched to Eliquis - d/c Aspirin 325 - started statin - will need to go to SNF tomorrow  Active Problems:    Permanent atrial fibrillation (HCC); CHA2DS2-VASc Score 6 - cont Zebeta and Eliquis    Moderate- severe aortic stenosis, mod pulm HTN - Per ECHO- follow    Essential hypertension -well controlled    Dementia - with behavioral disturbance- she needed Haldol yesterday for sundowning  - start a low dose of Seroquel at bedtime    Supratherapeutic INR- likely inaccuracte - INR 10 rechecked and found to be 1.75 and then 1.26   Low normal Vit B12 - give s/c B12 while in the hospital and switch to oral when discharged.     DVT prophylaxis: Eliquis Code Status: DNR Family Communication:  Disposition Plan: SNF tomorrow Consultants:   Neuro  Procedures:  - Carotid Doppler   There is 1-39% bilateral  ICA stenosis. Vertebral artery flow is antegrade.   2 D ECHO  - 2D Echocardiogram  - Left ventricle: The cavity size was normal. Wall thickness was increased in a pattern of moderate LVH. Systolic function was normal. The estimated ejection fraction was in the range of 60% to 65%. The study is not technically sufficient to allow evaluation of LV diastolic function. - Aortic valve: Mildly calcified leaflets. Moderate stenosis. There was trivial regurgitation. Mean gradient (S): 18 mm Hg. Peak gradient (S): 36 mm Hg. Valve area (VTI): 0.96 cm^2. Valve area (Vmax): 1.05 cm^2. Valve area (Vmean): 1.03 cm^2. - Mitral valve: Calcified annulus. Mildly thickened leaflets . There was mild regurgitation. - Left atrium: Severely dilated. - Right ventricle: The cavity size was mildly dilated. TAPSE: 11.7 mm . - Right atrium: Moderately dilated. - Tricuspid valve: There was moderate regurgitation. - Pulmonary arteries: PA peak pressure: 56 mm Hg (S). - Inferior vena cava: The vessel was normal in size. The respirophasic diameter changes were in the normal range (>= 50%), consistent with normal central venous pressure. Impressions: - Compared to a prior study in 2016, the LVEF is unchanged at60-65%. There is now moderate to severe aortic stenosis - AVAaround 1.0 cm2 with mean gradient of 18 mmHg (stable), however,LVOT measures 2.0 cm. There is severe LAE and moderate RAE andmoderate pulmonary hypertension with an RVSP of 56 mmHg.  Antimicrobials:  Anti-infectives (From  admission, onward)   Start     Dose/Rate Route Frequency Ordered Stop   07/03/18 1515  trimethoprim (TRIMPEX) tablet 100 mg     100 mg Oral Daily 07/03/18 1507         Objective: Vitals:   07/04/18 2004 07/04/18 2323 07/05/18 0440 07/05/18 0741  BP: (!) 156/90 (!) 139/91 (!) 183/79 (!) 149/98  Pulse: 74 (!) 141 (!) 54 69  Resp: 18 20 18 20   Temp: 97.8 F (36.6 C) 97.7 F (36.5 C) 97.8 F (36.6  C) 97.8 F (36.6 C)  TempSrc: Oral Oral Oral Oral  SpO2: 96% 92% 90% 97%  Weight:      Height:        Intake/Output Summary (Last 24 hours) at 07/05/2018 0744 Last data filed at 07/05/2018 0634 Gross per 24 hour  Intake -  Output 1500 ml  Net -1500 ml   Filed Weights   07/03/18 1252 07/03/18 1554  Weight: 72.6 kg 70 kg    Examination: General exam: Appears comfortable  HEENT: PERRLA, oral mucosa moist, no sclera icterus or thrush Respiratory system: Clear to auscultation. Respiratory effort normal. Cardiovascular system: S1 & S2 heard, RRR.   Gastrointestinal system: Abdomen soft, non-tender, nondistended. Normal bowel sounds. Central nervous system: Alert and oriented. No focal neurological deficits. Extremities: No cyanosis, clubbing or edema Skin: No rashes or ulcers Psychiatry:  Mood & affect appropriate.     Data Reviewed: I have personally reviewed following labs and imaging studies  CBC: Recent Labs  Lab 07/03/18 1219 07/03/18 1223  WBC 7.9  --   NEUTROABS 4.8  --   HGB 12.8 12.9  HCT 41.2 38.0  MCV 94.7  --   PLT 252  --    Basic Metabolic Panel: Recent Labs  Lab 07/03/18 1219 07/03/18 1223  NA 140 141  K 3.5 3.5  CL 109 107  CO2 23  --   GLUCOSE 113* 108*  BUN 13 15  CREATININE 0.77 0.70  CALCIUM 8.9  --    GFR: Estimated Creatinine Clearance: 42.9 mL/min (by C-G formula based on SCr of 0.7 mg/dL). Liver Function Tests: Recent Labs  Lab 07/03/18 1219  AST 21  ALT 13  ALKPHOS 163*  BILITOT 0.6  PROT 6.3*  ALBUMIN 3.8   No results for input(s): LIPASE, AMYLASE in the last 168 hours. No results for input(s): AMMONIA in the last 168 hours. Coagulation Profile: Recent Labs  Lab 07/03/18 1219 07/03/18 1606 07/04/18 0653  INR >10.00* 1.75 1.26   Cardiac Enzymes: No results for input(s): CKTOTAL, CKMB, CKMBINDEX, TROPONINI in the last 168 hours. BNP (last 3 results) No results for input(s): PROBNP in the last 8760  hours. HbA1C: Recent Labs    07/03/18 1219  HGBA1C 5.2   CBG: Recent Labs  Lab 07/04/18 0559  GLUCAP 98   Lipid Profile: Recent Labs    07/04/18 0653  CHOL 176  HDL 44  LDLCALC 110*  TRIG 112  CHOLHDL 4.0   Thyroid Function Tests: No results for input(s): TSH, T4TOTAL, FREET4, T3FREE, THYROIDAB in the last 72 hours. Anemia Panel: Recent Labs    07/04/18 1255  VITAMINB12 247   Urine analysis:    Component Value Date/Time   COLORURINE AMBER (A) 04/19/2018 0723   APPEARANCEUR TURBID (A) 04/19/2018 0723   LABSPEC 1.017 04/19/2018 0723   PHURINE 9.0 (H) 04/19/2018 0723   GLUCOSEU NEGATIVE 04/19/2018 0723   HGBUR SMALL (A) 04/19/2018 0723   BILIRUBINUR NEGATIVE 04/19/2018 1610  KETONESUR NEGATIVE 04/19/2018 0723   PROTEINUR 30 (A) 04/19/2018 0723   UROBILINOGEN 1.0 02/05/2015 0344   NITRITE NEGATIVE 04/19/2018 0723   LEUKOCYTESUR MODERATE (A) 04/19/2018 0723   Sepsis Labs: @LABRCNTIP (procalcitonin:4,lacticidven:4) )No results found for this or any previous visit (from the past 240 hour(s)).       Radiology Studies: Ct Head Wo Contrast  Result Date: 07/04/2018 CLINICAL DATA:  Neuro change with speech difficulty EXAM: CT HEAD WITHOUT CONTRAST TECHNIQUE: Contiguous axial images were obtained from the base of the skull through the vertex without intravenous contrast. COMPARISON:  Brain MRI from yesterday.  Head CT yesterday. FINDINGS: Brain: 2 known small acute infarcts by MRI are not detected on this CT. There is generalized atrophy and chronic small vessel ischemia with confluent gliosis in the cerebral white matter. No hemorrhage, hydrocephalus, or collection. Vascular: Atherosclerotic calcification.  No hyperdense vessel Skull: Negative Sinuses/Orbits: Bilateral cataract resection IMPRESSION: No acute finding or change from yesterday. Electronically Signed   By: Marnee Spring M.D.   On: 07/04/2018 07:55   Mr Brain Wo Contrast  Result Date:  07/03/2018 CLINICAL DATA:  84 year old female with right leg weakness, facial droop and slurred speech. EXAM: MRI HEAD WITHOUT CONTRAST MRA HEAD WITHOUT CONTRAST TECHNIQUE: Multiplanar, multiecho pulse sequences of the brain and surrounding structures were obtained without intravenous contrast. Angiographic images of the head were obtained using MRA technique without contrast. CONTRAST:  7 milliliters of Gadavist was administered for attempted Neck MRA, but for unclear technical reasons that study was nondiagnostic (both the time-of-flight and post-contrast portions) and is not being charged. No postcontrast imaging of the brain was requested or obtained. COMPARISON:  Head CT without contrast earlier today. Brain MRI 04/19/2018. FINDINGS: MRI HEAD FINDINGS Brain: There are 2 small foci of restricted diffusion identified. The larger measuring 8 millimeters is in the left centrum semiovale on series 3, image 37. There is a smaller cortical area of restricted diffusion along the posterolateral left lower parietal lobe on series 3, image 30. No other restricted diffusion identified. No associated acute hemorrhage or mass effect. Underlying severe widespread T2 and FLAIR signal hyperintensity in the cerebral white matter and throughout the deep gray matter nuclei stable since September. Comparatively mild for age T2 heterogeneity in the pons and there are 1 or 2 unchanged tiny chronic lacunar infarcts in the cerebellum (series 8, image 11). Occasional scattered chronic microhemorrhages are stable, including those in both thalami and the central pons No midline shift, mass effect, evidence of mass lesion, ventriculomegaly, extra-axial collection or acute intracranial hemorrhage. Cervicomedullary junction and pituitary are within normal limits. Vascular: Major intracranial vascular flow voids are stable since September. Skull and upper cervical spine: Negative for age visible cervical spine. Normal bone marrow signal.  Sinuses/Orbits: Stable and negative. Other: Mastoids remain clear. Visible internal auditory structures appear normal. Scalp and face soft tissues appear negative. MRA HEAD FINDINGS Antegrade flow in the distal vertebral arteries. The right vertebral appears to functionally terminates in PICA. The left PICA is patent. The distal left vertebral supplies the basilar without definite stenosis. The basilar artery is diminutive on the basis of fetal type bilateral PCA origins. Both SCA origins are patent. Bilateral posterior communicating arteries and PCA branches are patent with no proximal stenosis. Antegrade flow in both ICA siphons. Bilateral siphon irregularity without stenosis. Normal ophthalmic and posterior communicating artery origins. Patent carotid termini. Normal MCA and ACA origins. The left A1 is dominant and the right is diminutive. The anterior communicating artery and visible  ACA branches are within normal limits. MCA M1 segments and MCA bi/trifurcations are patent. No proximal MCA branch stenosis. IMPRESSION: 1. There are two small acute infarcts in the left centrum semiovale and left parietal lobe cortex with no hemorrhage or mass effect. 2. No other acute intracranial abnormality. Underlying advanced signal changes in the brain compatible with chronic small vessel disease. 3. Intracranial MRA is negative for large vessel occlusion or proximal branch stenosis. 4. Neck MRA was attempted with 7 mL IV Gadavist but for unclear technical reasons was non-diagnostic and is not being charged. Electronically Signed   By: Odessa Fleming M.D.   On: 07/03/2018 18:26   Dg Chest Port 1 View  Result Date: 07/04/2018 CLINICAL DATA:  CVA yesterday. Increased confusion. History of atrial fibrillation, aortic stenosis, diabetes, multiple scleroses, nonsmoker. EXAM: PORTABLE CHEST 1 VIEW COMPARISON:  Chest x-ray of April 13, 2018 FINDINGS: The lungs are adequately inflated. There are increased lung markings in the  retrocardiac region on the left. Elsewhere the interstitial markings are coarse but stable. The cardiac silhouette is enlarged. The pulmonary vascularity is not clearly engorged. There is calcification in the wall of the thoracic aorta. The bony thorax exhibits no acute abnormality. There is moderate degenerative change of the left shoulder with milder changes of the right shoulder. IMPRESSION: Chronic bronchitic changes. I cannot exclude atelectatic changes in the left lower lobe. No definite pneumonia. Stable cardiomegaly without pulmonary edema. Thoracic aortic atherosclerosis. Electronically Signed   By: David  [place] M.D.   On: 07/04/2018 13:31   Mr Shirlee Latch ME Contrast  Result Date: 07/03/2018 CLINICAL DATA:  84 year old female with right leg weakness, facial droop and slurred speech. EXAM: MRI HEAD WITHOUT CONTRAST MRA HEAD WITHOUT CONTRAST TECHNIQUE: Multiplanar, multiecho pulse sequences of the brain and surrounding structures were obtained without intravenous contrast. Angiographic images of the head were obtained using MRA technique without contrast. CONTRAST:  7 milliliters of Gadavist was administered for attempted Neck MRA, but for unclear technical reasons that study was nondiagnostic (both the time-of-flight and post-contrast portions) and is not being charged. No postcontrast imaging of the brain was requested or obtained. COMPARISON:  Head CT without contrast earlier today. Brain MRI 04/19/2018. FINDINGS: MRI HEAD FINDINGS Brain: There are 2 small foci of restricted diffusion identified. The larger measuring 8 millimeters is in the left centrum semiovale on series 3, image 37. There is a smaller cortical area of restricted diffusion along the posterolateral left lower parietal lobe on series 3, image 30. No other restricted diffusion identified. No associated acute hemorrhage or mass effect. Underlying severe widespread T2 and FLAIR signal hyperintensity in the cerebral white matter and  throughout the deep gray matter nuclei stable since September. Comparatively mild for age T2 heterogeneity in the pons and there are 1 or 2 unchanged tiny chronic lacunar infarcts in the cerebellum (series 8, image 11). Occasional scattered chronic microhemorrhages are stable, including those in both thalami and the central pons No midline shift, mass effect, evidence of mass lesion, ventriculomegaly, extra-axial collection or acute intracranial hemorrhage. Cervicomedullary junction and pituitary are within normal limits. Vascular: Major intracranial vascular flow voids are stable since September. Skull and upper cervical spine: Negative for age visible cervical spine. Normal bone marrow signal. Sinuses/Orbits: Stable and negative. Other: Mastoids remain clear. Visible internal auditory structures appear normal. Scalp and face soft tissues appear negative. MRA HEAD FINDINGS Antegrade flow in the distal vertebral arteries. The right vertebral appears to functionally terminates in PICA. The left PICA is  patent. The distal left vertebral supplies the basilar without definite stenosis. The basilar artery is diminutive on the basis of fetal type bilateral PCA origins. Both SCA origins are patent. Bilateral posterior communicating arteries and PCA branches are patent with no proximal stenosis. Antegrade flow in both ICA siphons. Bilateral siphon irregularity without stenosis. Normal ophthalmic and posterior communicating artery origins. Patent carotid termini. Normal MCA and ACA origins. The left A1 is dominant and the right is diminutive. The anterior communicating artery and visible ACA branches are within normal limits. MCA M1 segments and MCA bi/trifurcations are patent. No proximal MCA branch stenosis. IMPRESSION: 1. There are two small acute infarcts in the left centrum semiovale and left parietal lobe cortex with no hemorrhage or mass effect. 2. No other acute intracranial abnormality. Underlying advanced signal  changes in the brain compatible with chronic small vessel disease. 3. Intracranial MRA is negative for large vessel occlusion or proximal branch stenosis. 4. Neck MRA was attempted with 7 mL IV Gadavist but for unclear technical reasons was non-diagnostic and is not being charged. Electronically Signed   By: Odessa Fleming M.D.   On: 07/03/2018 18:26   Ct Head Code Stroke Wo Contrast  Result Date: 07/03/2018 CLINICAL DATA:  Code stroke. Right leg weakness, facial droop, and slurred speech. EXAM: CT HEAD WITHOUT CONTRAST TECHNIQUE: Contiguous axial images were obtained from the base of the skull through the vertex without intravenous contrast. COMPARISON:  Brain MRI 04/19/2018 and CT 04/13/2018 FINDINGS: Brain: There is no evidence of acute infarct, intracranial hemorrhage, mass, midline shift, or extra-axial fluid collection. Patchy to confluent hypodensities are again seen in the cerebral white matter bilaterally, similar to the prior CT and compatible with moderate chronic small vessel ischemic disease. Small chronic cerebellar infarcts on MRI are not well demonstrated by CT. Heterogeneous hypoattenuation in the basal ganglia corresponds to dilated perivascular spaces and right-sided lacunar infarcts on MRI. There is moderate cerebral atrophy. Vascular: Calcified atherosclerosis at the skull base. No hyperdense vessel. Skull: No fracture or focal osseous lesion. Sinuses/Orbits: Visualized paranasal sinuses and mastoid air cells are clear. Bilateral cataract extraction is noted. Other: None. ASPECTS [facility] Stroke Program Early CT Score) - Ganglionic level infarction (caudate, lentiform nuclei, internal capsule, insula, M1-M3 cortex): 7 - Supraganglionic infarction (M4-M6 cortex): 3 Total score (0-10 with 10 being normal): 10 IMPRESSION: 1. No evidence of acute intracranial abnormality. 2. ASPECTS is 10. 3. Moderate chronic small vessel ischemic disease and cerebral atrophy. These results were communicated to Dr.  Laurence Slate at 12:35 pm on 07/03/2018 by text page via the [facility] messaging system. Electronically Signed   By: Sebastian Ache M.D.   On: 07/03/2018 12:36   Vas US Carotid (at [facility] Only)  Result Date: 07/04/2018 Carotid Arterial Duplex Study Indications:       Weakness and slurred speech, facial droop. Risk Factors:      Hypertension. Other Factors:     Atrial fibrillation. Comparison Study:  Prior study from 08/31/14 is available for comparison. No                    significant change noted. Performing Technologist: Sherren Kerns RVS  Examination Guidelines: A complete evaluation includes B-mode imaging, spectral Doppler, color Doppler, and power Doppler as needed of all accessible portions of each vessel. Bilateral testing is considered an integral part of a complete examination. Limited examinations for reoccurring indications may be performed as noted.  Right Carotid Findings: +----------+--------+--------+--------+------------------+------------------+  PSV cm/sEDV cm/sStenosisDescribe          Comments           +----------+--------+--------+--------+------------------+------------------+ CCA Prox  37      8                                 intimal thickening +----------+--------+--------+--------+------------------+------------------+ CCA Distal39      10                                intimal thickening +----------+--------+--------+--------+------------------+------------------+ ICA Prox  31      12              focal and calcific                   +----------+--------+--------+--------+------------------+------------------+ ICA Distal37      11                                                   +----------+--------+--------+--------+------------------+------------------+ ECA       43      9                                                    +----------+--------+--------+--------+------------------+------------------+  +----------+--------+-------+--------+-------------------+           PSV cm/sEDV cmsDescribeArm Pressure (mmHG) +----------+--------+-------+--------+-------------------+ [id number]                                         +----------+--------+-------+--------+-------------------+ +---------+--------+--+--------+-+ VertebralPSV cm/s42EDV cm/s9 +---------+--------+--+--------+-+  Left Carotid Findings: +----------+--------+--------+--------+--------+------------------+           PSV cm/sEDV cm/sStenosisDescribeComments           +----------+--------+--------+--------+--------+------------------+ CCA Prox  58      13                      intimal thickening +----------+--------+--------+--------+--------+------------------+ CCA Distal37      13                      intimal thickening +----------+--------+--------+--------+--------+------------------+ ICA Prox  44      10              calcific                   +----------+--------+--------+--------+--------+------------------+ ICA Distal38      16                                         +----------+--------+--------+--------+--------+------------------+ ECA       41      3                                          +----------+--------+--------+--------+--------+------------------+ +----------+--------+--------+--------+-------------------+ SubclavianPSV cm/sEDV cm/sDescribeArm Pressure (mmHG) +----------+--------+--------+--------+-------------------+  41                                          +----------+--------+--------+--------+-------------------+ +---------+--------+--+--------+--+ VertebralPSV cm/s40EDV cm/s13 +---------+--------+--+--------+--+  Summary: Right Carotid: The extracranial vessels were near-normal with only minimal wall                thickening or plaque. Left Carotid: The extracranial vessels were near-normal with only minimal wall               thickening or  plaque. Vertebrals:  Bilateral vertebral arteries demonstrate antegrade flow. Subclavians: Normal flow hemodynamics were seen in bilateral subclavian              arteries. *See table(s) above for measurements and observations.  Electronically signed by Delia Heady MD on 07/04/2018 at 1:08:02 PM.    Final       Scheduled Meds: . apixaban  5 mg Oral BID  . aspirin  325 mg Oral Daily  . bisoprolol  10 mg Oral Daily  . docusate sodium  100 mg Oral BID  . feeding supplement (ENSURE ENLIVE)  237 mL Oral BID BM  . levothyroxine  125 mcg Oral QAC breakfast  . senna  1 tablet Oral Daily  . trimethoprim  100 mg Oral Daily   Continuous Infusions: . sodium chloride 50 mL/hr at 07/03/18 1903     LOS: 2 days    Time spent in minutes: 35    Calvert Cantor, MD [facility] Pager: [email or web address] Password [facility] 07/05/2018, 7:44 AM

## 2018-07-05 NOTE — Clinical Social Work Note (Signed)
Clinical Social Work Assessment  Patient Details  Name: Makayla Thomas MRN: [id number] Date of Birth: September 20, 1925  Date of referral:  07/05/18               Reason for consult:  Facility Placement                Permission sought to share information with:  Facility Medical sales representative, Family Supports Permission granted to share information::  Yes, Verbal Permission Granted  Name::     Brett Canales  Agency::  [facility]  Relationship::  Son  Solicitor Information:     Housing/Transportation Living arrangements for the past 2 months:  Skilled Holiday representative, Assisted Living Facility Source of Information:  Adult Children, Medical Team Patient Interpreter Needed:  None Criminal Activity/Legal Involvement Pertinent to Current Situation/Hospitalization:  No - Comment as needed Significant Relationships:  Adult Children Lives with:  Self, Facility Resident Do you feel safe going back to the place where you live?  Yes Need for family participation in patient care:  Yes (Comment)  Care giving concerns:  Patient from [facility], and son would like to see if patient can return. Patient's son discussed how he knows that she's had a stroke and had some deficits, but he would like to get her settled into the place that's going to be her home, as she's only moved into [facility] a week ago.   Social Worker assessment / plan:  CSW spoke with patient's son over the phone to discuss discharge plan. CSW discussed possible recommendations for SNF, pending ALF being able to handle the level of assistance that the patient requires. Patient's son would prefer that the patient return to ALF, if possible. CSW reached out to [facility] and sent clinical information to see if they can take the patient back. CSW to follow.  Employment status:  Retired Health and safety inspector:  Medicare PT Recommendations:  Skilled Nursing Facility Information / Referral to community resources:     Patient/Family's Response  to care:  Patient's son preference to return to ALF.  Patient/Family's Understanding of and Emotional Response to Diagnosis, Current Treatment, and Prognosis:  Patient's son discussed how the patient had only moved in to [facility] last week, and he was really trying to get her settled in there for that to be her long term residence. Patient's son discussed how he is aware of their assistance criteria, as she is currently only at a Level 3 with her assistance needs and there are 5 levels available.   Emotional Assessment Appearance:  Appears stated age Attitude/Demeanor/Rapport:  Unable to Assess Affect (typically observed):  Unable to Assess Orientation:  Oriented to Self Alcohol / Substance use:  Not Applicable Psych involvement (Current and /or in the community):  No (Comment)  Discharge Needs  Concerns to be addressed:  Care Coordination Readmission within the last 30 days:  No Current discharge risk:  Physical Impairment, Cognitively Impaired Barriers to Discharge:  Continued Medical Work up, Insurance Authorization   Baldemar Lenis, LCSW 07/05/2018, 12:12 PM

## 2018-07-05 NOTE — Progress Notes (Signed)
Physical Therapy Treatment Patient Details Name: Makayla Thomas MRN: [id number] DOB: 1926/02/01 Today's Date: 07/05/2018    History of Present Illness 84 y.o. female  With PMH stroke ( 2011/2012), HTN, HLD a. Fib ( xarelto), PAD, Hypothyroidism who presented to [facility] ED as a code stroke from [facility] with c/o right leg weakness and slurred speech. She fell in Aug 2019 sustaining L femur and L ankle fx, undergoing ORIF on both 04/14/18. MRI revealed two small acute infarcts in the left centrum semiovale and left parietal lobe cortex with no hemorrhage or mass effect.     PT Comments    Pt admitted with above diagnosis. Pt currently with functional limitations due to the deficits listed below (see PT Problem List). Pt was able to stand and pivot to recliner with mod assist.  Pt confused and took incr time to encourage OOB.  Pt took incr time to perform 2 exercises as she gets distracted easily.  Will follow acutely.   Pt will benefit from skilled PT to increase their independence and safety with mobility to allow discharge to the venue listed below.     Follow Up Recommendations  SNF     Equipment Recommendations  None recommended by PT    Recommendations for Other Services       Precautions / Restrictions Precautions Precautions: Fall Restrictions Weight Bearing Restrictions: No    Mobility  Bed Mobility Overal bed mobility: Needs Assistance Bed Mobility: Supine to Sit     Supine to sit: Min assist;HOB elevated        Transfers Overall transfer level: Needs assistance Equipment used: None Transfers: Sit to/from [employer] Sit to Stand: Mod assist Stand pivot transfers: Mod assist       General transfer comment: mod lifting assistance to stand from recliner chair with increased time and mod cuing for hand placement  Ambulation/Gait             General Gait Details: unable   Stairs             Wheelchair Mobility    Modified  Rankin (Stroke Patients Only) Modified Rankin (Stroke Patients Only) Pre-Morbid Rankin Score: Slight disability Modified Rankin: Moderately severe disability     Balance Overall balance assessment: Needs assistance Sitting-balance support: No upper extremity supported;Feet supported Sitting balance-Leahy Scale: Good     Standing balance support: Single extremity supported;During functional activity Standing balance-Leahy Scale: Poor Standing balance comment: reliant on external support                            Cognition Arousal/Alertness: Awake/alert Behavior During Therapy: WFL for tasks assessed/performed Overall Cognitive Status: Impaired/Different from baseline Area of Impairment: Safety/judgement;Problem solving                         Safety/Judgement: Decreased awareness of deficits;Decreased awareness of safety     General Comments: Disoriented to time. Follows simple commands consistently. Decreased safety. Word finding difficulty.       Exercises General Exercises - Lower Extremity Ankle Circles/Pumps: AROM;Both;10 reps;Supine Long Arc Quad: AROM;Both;10 reps;Seated    General Comments        Pertinent Vitals/Pain Pain Assessment: No/denies pain    Home Living                      Prior Function  PT Goals (current goals can now be found in the care plan section) Acute Rehab PT Goals Patient Stated Goal: to get better Progress towards PT goals: Progressing toward goals    Frequency    Min 3X/week      PT Plan Current plan remains appropriate    Co-evaluation              AM-PAC PT "6 Clicks" Daily Activity  Outcome Measure  Difficulty turning over in bed (including adjusting bedclothes, sheets and blankets)?: A Lot Difficulty moving from lying on back to sitting on the side of the bed? : A Lot Difficulty sitting down on and standing up from a chair with arms (e.g., wheelchair, bedside  commode, etc,.)?: Unable Help needed moving to and from a bed to chair (including a wheelchair)?: A Little Help needed walking in hospital room?: A Lot Help needed climbing 3-5 steps with a railing? : Total 6 Click Score: 11    End of Session Equipment Utilized During Treatment: Gait belt Activity Tolerance: Patient tolerated treatment well Patient left: in chair;with call bell/phone within reach;with chair alarm set Nurse Communication: Mobility status PT Visit Diagnosis: Other abnormalities of gait and mobility (R26.89)     Time: 1610-9604 PT Time Calculation (min) (ACUTE ONLY): 17 min  Charges:  $Therapeutic Activity: 8-22 mins                     Jinelle Butchko,PT Acute Rehabilitation Services Pager:  [phone]  Office:  [phone]     Berline Lopes 07/05/2018, 11:59 AM

## 2018-07-06 MED ORDER — ATORVASTATIN CALCIUM 10 MG PO TABS: 10.0000 mg | ORAL_TABLET | Freq: Every day | ORAL | 0 refills | Status: DC

## 2018-07-06 MED ORDER — APIXABAN 5 MG PO TABS: 5.0000 mg | ORAL_TABLET | Freq: Two times a day (BID) | ORAL | 0 refills | Status: DC

## 2018-07-06 NOTE — Progress Notes (Signed)
Discharge to: [facility] Anticipated discharge date: 07/06/18 Family notified: Yes, by phone  Transportation by: [facility]  Report #: [phone]  CSW signing off.  Blenda Nicely LCSW [phone]

## 2018-07-06 NOTE — Discharge Summary (Addendum)
Physician Discharge Summary  Duru Reiger ZOX:[id number] DOB: [birth year] DOA: 07/03/2018  PCP: Merri Brunette, MD  Admit date: 07/03/2018 Discharge date: 07/06/2018  Admitted From: ALF  Disposition:  ALF  Recommendations for Outpatient Follow-up and new medication changes:  1. Patient has been placed on atorvastatin  2. Rivaroxaban changed to apixaban for anticoagulation.    Home Health: na  Equipment/Devices: na    Discharge Condition: stable  CODE STATUS: DNR   Diet recommendation:  Heart healthy   Brief/Interim Summary: 84 year old female who presented with left facial droop, slurred speech and right leg weakness.  She does have significant past medical history for atrial fibrillation, hypertension and mild dementia.  Patient was noted to have difficulty moving her right leg, right hand, and associated with slurred speech.  On her initial physical examination blood pressure 150/77, heart rate 77, respiratory rate 16, oxygen saturation 94%.  She was awake and alert, heart S1-S2 present, irregularly irregular, lungs were clear to auscultation bilaterally, abdomen soft nontender, trace lower extremity edema, positive weakness in the right side, upper extremity 4/ 5, lower extremity 2/ 5.  Sodium 140, potassium 3.5, chloride 109, bicarb 23, glucose 113, BUN 13, creatinine 0.77, white count 7.9, hemoglobin 12.8, hematocrit 41.2, platelets 252, INR 1.75, chest x-ray with increased lung markings bilaterally, bibasilar atelectasis.  EKG with atrial fibrillation, rate 67 bpm, normal axis.  CT head with no acute evidence of intracranial normality.  Patient was admitted to the hospital with the working diagnosis of acute focal neurologic deficit, acute ischemic CVA.   1.  Acute ischemic infarcts (x2 small), in the left centrum semiovale and left parietal lobe cortex.  Patient was admitted to the medical unit, she was placed on a remote telemetry monitor, had neurochecks, speech therapy evaluation,  physical therapy evaluation and neurology consultation.  Further work-up with carotid ultrasonography showed no significant stenosis, echocardiography ejection fraction 60 to 65%, her LDL was 110.  Initially placed on Aspirin then it was discontinued and patient was placed on apixaban and atorvastatin.  2.  Chronic atrial fibrillation.  Remained rate controlled, continue bisoprolol, rivaroxaban was exchanged to apixaban with no major complications.  3.  Hypertension.  Blood pressure remained well controlled.  4.  Hypothyroidism.  Continue levothyroxine.  5.  Anemia of iron deficiency.  Continue iron supplements.  6.  Dyslipidemia.  Target LDL less than 70, patient has been started on atorvastatin 10 mg daily.  7.  Mild dementia.  No confusion or agitation.  8. Protein calori malnutrition (unspecified). Patient was placed on nutritional supplements during her hospital stay.   Discharge Diagnoses:  Principal Problem:   Stroke [facility]) Active Problems:   Moderate aortic stenosis   Permanent atrial fibrillation (HCC); CHA2DS2-VASc Score 6   Essential hypertension -well controlled   Dementia (HCC)   Supratherapeutic INR   Delirium   TIA (transient ischemic attack)    Discharge Instructions  Discharge Instructions    Ambulatory referral to Neurology   Complete by:  As directed    Follow up with stroke clinic NP (Jessica Vanschaick or Darrol Angel, if both not available, consider Dr. Delia Heady, Dr. Jamelle Rushing, or Dr. Naomie Dean) at [facility] in about 4 weeks.  **Note - this is a pt of dr. Frances Furbish. Ok to follow up with her     Allergies as of 07/06/2018      Reactions   Tetanus Antitoxin Anaphylaxis   Throat swelling   Tramadol Other (See Comments)   HALLUCINATIONS; "and paranoia"  Augmentin [amoxicillin-pot Clavulanate]    UNSPECIFIED REACTION    Codeine    UNSPECIFIED REACTION    Livalo [pitavastatin]    UNSPECIFIED REACTION    Meclizine  Other (See Comments)   Had funny feeling      Medication List    STOP taking these medications   polyethylene glycol packet Commonly known as:  MIRALAX / GLYCOLAX   Rivaroxaban 15 MG Tabs tablet Commonly known as:  XARELTO   trimethoprim 100 MG tablet Commonly known as:  TRIMPEX     TAKE these medications   acetaminophen 325 MG tablet Commonly known as:  TYLENOL Take 2 tablets (650 mg total) by mouth every 6 (six) hours as needed for mild pain or moderate pain.   apixaban 5 MG Tabs tablet Commonly known as:  ELIQUIS Take 1 tablet (5 mg total) by mouth 2 (two) times daily.   atorvastatin 10 MG tablet Commonly known as:  LIPITOR Take 1 tablet (10 mg total) by mouth daily at 6 PM.   bisoprolol 10 MG tablet Commonly known as:  ZEBETA Take 1 tablet (10 mg total) by mouth daily.   cholecalciferol 1000 units tablet Commonly known as:  VITAMIN D Take 1,000 Units by mouth every morning.   Cranberry 250 MG Caps Take 250 mg by mouth daily.   docusate sodium 100 MG capsule Commonly known as:  COLACE Take 1 capsule (100 mg total) by mouth 2 (two) times daily.   feeding supplement (ENSURE ENLIVE) Liqd Take 237 mLs by mouth 2 (two) times daily between meals.   iron polysaccharides 150 MG capsule Commonly known as:  NIFEREX Take 1 capsule (150 mg total) by mouth daily.   levothyroxine 125 MCG tablet Commonly known as:  SYNTHROID, LEVOTHROID Take 125 mcg by mouth daily before breakfast.   senna 8.6 MG tablet Commonly known as:  SENOKOT Take 1 tablet by mouth daily.      Follow-up Information    Huston Foley, MD Follow up in 4 week(s).   Specialties:  Neurology, Radiology Why:  stroke clinic. office will call with appt date and time. Contact information: [address] [place] [address] [phone]          Allergies  Allergen Reactions  . Tetanus Antitoxin Anaphylaxis    Throat swelling  . Tramadol Other (See Comments)     HALLUCINATIONS; "and paranoia"  . Augmentin [Amoxicillin-Pot Clavulanate]     UNSPECIFIED REACTION   . Codeine     UNSPECIFIED REACTION   . Livalo [Pitavastatin]     UNSPECIFIED REACTION   . Meclizine Other (See Comments)    Had funny feeling    Consultations:  Neurology    Procedures/Studies: Ct Head Wo Contrast  Result Date: 07/04/2018 CLINICAL DATA:  Neuro change with speech difficulty EXAM: CT HEAD WITHOUT CONTRAST TECHNIQUE: Contiguous axial images were obtained from the base of the skull through the vertex without intravenous contrast. COMPARISON:  Brain MRI from yesterday.  Head CT yesterday. FINDINGS: Brain: 2 known small acute infarcts by MRI are not detected on this CT. There is generalized atrophy and chronic small vessel ischemia with confluent gliosis in the cerebral white matter. No hemorrhage, hydrocephalus, or collection. Vascular: Atherosclerotic calcification.  No hyperdense vessel Skull: Negative Sinuses/Orbits: Bilateral cataract resection IMPRESSION: No acute finding or change from yesterday. Electronically Signed   By: Marnee Spring M.D.   On: 07/04/2018 07:55   Mr Brain Wo Contrast  Result Date: 07/03/2018 CLINICAL DATA:  84 year old female with  right leg weakness, facial droop and slurred speech. EXAM: MRI HEAD WITHOUT CONTRAST MRA HEAD WITHOUT CONTRAST TECHNIQUE: Multiplanar, multiecho pulse sequences of the brain and surrounding structures were obtained without intravenous contrast. Angiographic images of the head were obtained using MRA technique without contrast. CONTRAST:  7 milliliters of Gadavist was administered for attempted Neck MRA, but for unclear technical reasons that study was nondiagnostic (both the time-of-flight and post-contrast portions) and is not being charged. No postcontrast imaging of the brain was requested or obtained. COMPARISON:  Head CT without contrast earlier today. Brain MRI 04/19/2018. FINDINGS: MRI HEAD FINDINGS Brain: There are 2  small foci of restricted diffusion identified. The larger measuring 8 millimeters is in the left centrum semiovale on series 3, image 37. There is a smaller cortical area of restricted diffusion along the posterolateral left lower parietal lobe on series 3, image 30. No other restricted diffusion identified. No associated acute hemorrhage or mass effect. Underlying severe widespread T2 and FLAIR signal hyperintensity in the cerebral white matter and throughout the deep gray matter nuclei stable since September. Comparatively mild for age T2 heterogeneity in the pons and there are 1 or 2 unchanged tiny chronic lacunar infarcts in the cerebellum (series 8, image 11). Occasional scattered chronic microhemorrhages are stable, including those in both thalami and the central pons No midline shift, mass effect, evidence of mass lesion, ventriculomegaly, extra-axial collection or acute intracranial hemorrhage. Cervicomedullary junction and pituitary are within normal limits. Vascular: Major intracranial vascular flow voids are stable since September. Skull and upper cervical spine: Negative for age visible cervical spine. Normal bone marrow signal. Sinuses/Orbits: Stable and negative. Other: Mastoids remain clear. Visible internal auditory structures appear normal. Scalp and face soft tissues appear negative. MRA HEAD FINDINGS Antegrade flow in the distal vertebral arteries. The right vertebral appears to functionally terminates in PICA. The left PICA is patent. The distal left vertebral supplies the basilar without definite stenosis. The basilar artery is diminutive on the basis of fetal type bilateral PCA origins. Both SCA origins are patent. Bilateral posterior communicating arteries and PCA branches are patent with no proximal stenosis. Antegrade flow in both ICA siphons. Bilateral siphon irregularity without stenosis. Normal ophthalmic and posterior communicating artery origins. Patent carotid termini. Normal MCA and  ACA origins. The left A1 is dominant and the right is diminutive. The anterior communicating artery and visible ACA branches are within normal limits. MCA M1 segments and MCA bi/trifurcations are patent. No proximal MCA branch stenosis. IMPRESSION: 1. There are two small acute infarcts in the left centrum semiovale and left parietal lobe cortex with no hemorrhage or mass effect. 2. No other acute intracranial abnormality. Underlying advanced signal changes in the brain compatible with chronic small vessel disease. 3. Intracranial MRA is negative for large vessel occlusion or proximal branch stenosis. 4. Neck MRA was attempted with 7 mL IV Gadavist but for unclear technical reasons was non-diagnostic and is not being charged. Electronically Signed   By: Odessa Fleming M.D.   On: 07/03/2018 18:26   Dg Chest Port 1 View  Result Date: 07/04/2018 CLINICAL DATA:  CVA yesterday. Increased confusion. History of atrial fibrillation, aortic stenosis, diabetes, multiple scleroses, nonsmoker. EXAM: PORTABLE CHEST 1 VIEW COMPARISON:  Chest x-ray of April 13, 2018 FINDINGS: The lungs are adequately inflated. There are increased lung markings in the retrocardiac region on the left. Elsewhere the interstitial markings are coarse but stable. The cardiac silhouette is enlarged. The pulmonary vascularity is not clearly engorged. There is calcification in  the wall of the thoracic aorta. The bony thorax exhibits no acute abnormality. There is moderate degenerative change of the left shoulder with milder changes of the right shoulder. IMPRESSION: Chronic bronchitic changes. I cannot exclude atelectatic changes in the left lower lobe. No definite pneumonia. Stable cardiomegaly without pulmonary edema. Thoracic aortic atherosclerosis. Electronically Signed   By: David  [place] M.D.   On: 07/04/2018 13:31   Mr Shirlee Latch EP Contrast  Result Date: 07/03/2018 CLINICAL DATA:  84 year old female with right leg weakness, facial droop and  slurred speech. EXAM: MRI HEAD WITHOUT CONTRAST MRA HEAD WITHOUT CONTRAST TECHNIQUE: Multiplanar, multiecho pulse sequences of the brain and surrounding structures were obtained without intravenous contrast. Angiographic images of the head were obtained using MRA technique without contrast. CONTRAST:  7 milliliters of Gadavist was administered for attempted Neck MRA, but for unclear technical reasons that study was nondiagnostic (both the time-of-flight and post-contrast portions) and is not being charged. No postcontrast imaging of the brain was requested or obtained. COMPARISON:  Head CT without contrast earlier today. Brain MRI 04/19/2018. FINDINGS: MRI HEAD FINDINGS Brain: There are 2 small foci of restricted diffusion identified. The larger measuring 8 millimeters is in the left centrum semiovale on series 3, image 37. There is a smaller cortical area of restricted diffusion along the posterolateral left lower parietal lobe on series 3, image 30. No other restricted diffusion identified. No associated acute hemorrhage or mass effect. Underlying severe widespread T2 and FLAIR signal hyperintensity in the cerebral white matter and throughout the deep gray matter nuclei stable since September. Comparatively mild for age T2 heterogeneity in the pons and there are 1 or 2 unchanged tiny chronic lacunar infarcts in the cerebellum (series 8, image 11). Occasional scattered chronic microhemorrhages are stable, including those in both thalami and the central pons No midline shift, mass effect, evidence of mass lesion, ventriculomegaly, extra-axial collection or acute intracranial hemorrhage. Cervicomedullary junction and pituitary are within normal limits. Vascular: Major intracranial vascular flow voids are stable since September. Skull and upper cervical spine: Negative for age visible cervical spine. Normal bone marrow signal. Sinuses/Orbits: Stable and negative. Other: Mastoids remain clear. Visible internal auditory  structures appear normal. Scalp and face soft tissues appear negative. MRA HEAD FINDINGS Antegrade flow in the distal vertebral arteries. The right vertebral appears to functionally terminates in PICA. The left PICA is patent. The distal left vertebral supplies the basilar without definite stenosis. The basilar artery is diminutive on the basis of fetal type bilateral PCA origins. Both SCA origins are patent. Bilateral posterior communicating arteries and PCA branches are patent with no proximal stenosis. Antegrade flow in both ICA siphons. Bilateral siphon irregularity without stenosis. Normal ophthalmic and posterior communicating artery origins. Patent carotid termini. Normal MCA and ACA origins. The left A1 is dominant and the right is diminutive. The anterior communicating artery and visible ACA branches are within normal limits. MCA M1 segments and MCA bi/trifurcations are patent. No proximal MCA branch stenosis. IMPRESSION: 1. There are two small acute infarcts in the left centrum semiovale and left parietal lobe cortex with no hemorrhage or mass effect. 2. No other acute intracranial abnormality. Underlying advanced signal changes in the brain compatible with chronic small vessel disease. 3. Intracranial MRA is negative for large vessel occlusion or proximal branch stenosis. 4. Neck MRA was attempted with 7 mL IV Gadavist but for unclear technical reasons was non-diagnostic and is not being charged. Electronically Signed   By: Odessa Fleming M.D.   On:  07/03/2018 18:26   Ct Head Code Stroke Wo Contrast  Result Date: 07/03/2018 CLINICAL DATA:  Code stroke. Right leg weakness, facial droop, and slurred speech. EXAM: CT HEAD WITHOUT CONTRAST TECHNIQUE: Contiguous axial images were obtained from the base of the skull through the vertex without intravenous contrast. COMPARISON:  Brain MRI 04/19/2018 and CT 04/13/2018 FINDINGS: Brain: There is no evidence of acute infarct, intracranial hemorrhage, mass, midline  shift, or extra-axial fluid collection. Patchy to confluent hypodensities are again seen in the cerebral white matter bilaterally, similar to the prior CT and compatible with moderate chronic small vessel ischemic disease. Small chronic cerebellar infarcts on MRI are not well demonstrated by CT. Heterogeneous hypoattenuation in the basal ganglia corresponds to dilated perivascular spaces and right-sided lacunar infarcts on MRI. There is moderate cerebral atrophy. Vascular: Calcified atherosclerosis at the skull base. No hyperdense vessel. Skull: No fracture or focal osseous lesion. Sinuses/Orbits: Visualized paranasal sinuses and mastoid air cells are clear. Bilateral cataract extraction is noted. Other: None. ASPECTS [facility] Stroke Program Early CT Score) - Ganglionic level infarction (caudate, lentiform nuclei, internal capsule, insula, M1-M3 cortex): 7 - Supraganglionic infarction (M4-M6 cortex): 3 Total score (0-10 with 10 being normal): 10 IMPRESSION: 1. No evidence of acute intracranial abnormality. 2. ASPECTS is 10. 3. Moderate chronic small vessel ischemic disease and cerebral atrophy. These results were communicated to Dr. Laurence Slate at 12:35 pm on 07/03/2018 by text page via the [facility] messaging system. Electronically Signed   By: Sebastian Ache M.D.   On: 07/03/2018 12:36   Vas US Carotid (at [facility])  Result Date: 07/04/2018 Carotid Arterial Duplex Study Indications:       Weakness and slurred speech, facial droop. Risk Factors:      Hypertension. Other Factors:     Atrial fibrillation. Comparison Study:  Prior study from 08/31/14 is available for comparison. No                    significant change noted. Performing Technologist: Sherren Kerns RVS  Examination Guidelines: A complete evaluation includes B-mode imaging, spectral Doppler, color Doppler, and power Doppler as needed of all accessible portions of each vessel. Bilateral testing is considered an integral part of a complete examination.  Limited examinations for reoccurring indications may be performed as noted.  Right Carotid Findings: +----------+--------+--------+--------+------------------+------------------+           PSV cm/sEDV cm/sStenosisDescribe          Comments           +----------+--------+--------+--------+------------------+------------------+ CCA Prox  37      8                                 intimal thickening +----------+--------+--------+--------+------------------+------------------+ CCA Distal39      10                                intimal thickening +----------+--------+--------+--------+------------------+------------------+ ICA Prox  31      12              focal and calcific                   +----------+--------+--------+--------+------------------+------------------+ ICA Distal37      11                                                   +----------+--------+--------+--------+------------------+------------------+  ECA       43      9                                                    +----------+--------+--------+--------+------------------+------------------+ +----------+--------+-------+--------+-------------------+           PSV cm/sEDV cmsDescribeArm Pressure (mmHG) +----------+--------+-------+--------+-------------------+ [id number]                                         +----------+--------+-------+--------+-------------------+ +---------+--------+--+--------+-+ VertebralPSV cm/s42EDV cm/s9 +---------+--------+--+--------+-+  Left Carotid Findings: +----------+--------+--------+--------+--------+------------------+           PSV cm/sEDV cm/sStenosisDescribeComments           +----------+--------+--------+--------+--------+------------------+ CCA Prox  58      13                      intimal thickening +----------+--------+--------+--------+--------+------------------+ CCA Distal37      13                      intimal thickening  +----------+--------+--------+--------+--------+------------------+ ICA Prox  44      10              calcific                   +----------+--------+--------+--------+--------+------------------+ ICA Distal38      16                                         +----------+--------+--------+--------+--------+------------------+ ECA       41      3                                          +----------+--------+--------+--------+--------+------------------+ +----------+--------+--------+--------+-------------------+ SubclavianPSV cm/sEDV cm/sDescribeArm Pressure (mmHG) +----------+--------+--------+--------+-------------------+           41                                          +----------+--------+--------+--------+-------------------+ +---------+--------+--+--------+--+ VertebralPSV cm/s40EDV cm/s13 +---------+--------+--+--------+--+  Summary: Right Carotid: The extracranial vessels were near-normal with only minimal wall                thickening or plaque. Left Carotid: The extracranial vessels were near-normal with only minimal wall               thickening or plaque. Vertebrals:  Bilateral vertebral arteries demonstrate antegrade flow. Subclavians: Normal flow hemodynamics were seen in bilateral subclavian              arteries. *See table(s) above for measurements and observations.  Electronically signed by Delia Heady MD on 07/04/2018 at 1:08:02 PM.    Final        Subjective: Patient is feeling better, no chest pain, no nausea or vomiting.   Discharge Exam: Vitals:   07/06/18 0409 07/06/18 0731  BP: (!) 161/98 (!) 160/75  Pulse: 63 63  Resp: 18 18  Temp: (!) 97.5  F (36.4 C) 97.9 F (36.6 C)  SpO2: 99% 94%   Vitals:   07/05/18 1942 07/06/18 0056 07/06/18 0409 07/06/18 0731  BP: (!) 177/97 (!) 155/78 (!) 161/98 (!) 160/75  Pulse: 68 71 63 63  Resp: 18 18 18 18   Temp: 97.8 F (36.6 C) 98.6 F (37 C) (!) 97.5 F (36.4 C) 97.9 F (36.6 C)   TempSrc: Oral Oral Oral Oral  SpO2: 93% 98% 99% 94%  Weight:      Height:        General: Not in pain or dyspnea  Neurology: Awake and alert, non focal  E ENT: no pallor, no icterus, oral mucosa moist Cardiovascular: No JVD. S1-S2 present, rhythmic, no gallops, rubs, or murmurs. No lower extremity edema. Pulmonary: vesicular breath sounds bilaterally, adequate air movement, no wheezing, rhonchi or rales. Gastrointestinal. Abdomen with no organomegaly, non tender, no rebound or guarding Skin. No rashes Musculoskeletal: no joint deformities   The results of significant diagnostics from this hospitalization (including imaging, microbiology, ancillary and laboratory) are listed below for reference.     Microbiology: No results found for this or any previous visit (from the past 240 hour(s)).   Labs: BNP (last 3 results) No results for input(s): BNP in the last 8760 hours. Basic Metabolic Panel: Recent Labs  Lab 07/03/18 1219 07/03/18 1223  NA 140 141  K 3.5 3.5  CL 109 107  CO2 23  --   GLUCOSE 113* 108*  BUN 13 15  CREATININE 0.77 0.70  CALCIUM 8.9  --    Liver Function Tests: Recent Labs  Lab 07/03/18 1219  AST 21  ALT 13  ALKPHOS 163*  BILITOT 0.6  PROT 6.3*  ALBUMIN 3.8   No results for input(s): LIPASE, AMYLASE in the last 168 hours. No results for input(s): AMMONIA in the last 168 hours. CBC: Recent Labs  Lab 07/03/18 1219 07/03/18 1223 07/04/18 1255  WBC 7.9  --   --   NEUTROABS 4.8  --   --   HGB 12.8 12.9  --   HCT 41.2 38.0 39.3  MCV 94.7  --   --   PLT 252  --   --    Cardiac Enzymes: No results for input(s): CKTOTAL, CKMB, CKMBINDEX, TROPONINI in the last 168 hours. BNP: Invalid input(s): POCBNP CBG: Recent Labs  Lab 07/04/18 0559  GLUCAP 98   D-Dimer No results for input(s): DDIMER in the last 72 hours. Hgb A1c Recent Labs    07/03/18 1219  HGBA1C 5.2   Lipid Profile Recent Labs    07/04/18 0653  CHOL 176  HDL 44   LDLCALC 110*  TRIG 112  CHOLHDL 4.0   Thyroid function studies No results for input(s): TSH, T4TOTAL, T3FREE, THYROIDAB in the last 72 hours.  Invalid input(s): FREET3 Anemia work up Recent Labs    07/04/18 1255  VITAMINB12 247   Urinalysis    Component Value Date/Time   COLORURINE AMBER (A) 04/19/2018 0723   APPEARANCEUR TURBID (A) 04/19/2018 0723   LABSPEC 1.017 04/19/2018 0723   PHURINE 9.0 (H) 04/19/2018 0723   GLUCOSEU NEGATIVE 04/19/2018 0723   HGBUR SMALL (A) 04/19/2018 0723   BILIRUBINUR NEGATIVE 04/19/2018 0723   KETONESUR NEGATIVE 04/19/2018 0723   PROTEINUR 30 (A) 04/19/2018 0723   UROBILINOGEN 1.0 02/05/2015 0344   NITRITE NEGATIVE 04/19/2018 0723   LEUKOCYTESUR MODERATE (A) 04/19/2018 0723   Sepsis Labs Invalid input(s): PROCALCITONIN,  WBC,  LACTICIDVEN Microbiology No results found for this or  any previous visit (from the past 240 hour(s)).   Time coordinating discharge: 45 minutes  SIGNED:   Coralie Keens, MD  [facility] 07/06/2018, 8:52 AM Pager [phone]  If 7PM-7AM, please contact night-coverage [email or web address] Password [email or web address]

## 2018-07-06 NOTE — Progress Notes (Signed)
CSW following for discharge plan. CSW received call back from [facility], and they are able to take the patient back. Unable to accept back today, will need discharge tomorrow. CSW contacted patient's son to inform him that the patient will be able to go back to [facility].  Blenda Nicely, [place] Clinical Social Worker [phone]

## 2018-07-06 NOTE — [place] FL2 (Signed)
[place] MEDICAID FL2 LEVEL OF CARE SCREENING TOOL     IDENTIFICATION  Patient Name: Makayla Thomas Birthdate: 1926-05-23 Sex: female Admission Date (Current Location): 07/03/2018  [facility] and [place] Number:  Producer, television/film/video and Address:  [facility], [address] [place], [place] [address]      Provider Number: [id number]  Attending Physician Name and Address:  Coralie Keens,*  Relative Name and Phone Number:  Sylivia Lyon, [phone] (H), [phone] (C)    Current Level of Care: Hospital Recommended Level of Care: Assisted Living Facility Prior Approval Number:    Date Approved/Denied:   PASRR Number:    Discharge Plan: Other (Comment)(ALF)    Current Diagnoses: Patient Active Problem List   Diagnosis Date Noted  . TIA (transient ischemic attack)   . Delirium 07/04/2018  . Supratherapeutic INR 07/03/2018  . Supracondylar fracture of left femur, closed, initial encounter (HCC) 04/13/2018  . Trimalleolar fracture of ankle, closed, left, initial encounter 04/13/2018  . CKD (chronic kidney disease) stage 3, GFR 30-59 ml/min (HCC) 04/13/2018  . Hyperglycemia 04/13/2018  . Dementia (HCC) 04/13/2018  . Periprosthetic fracture around internal prosthetic right hip joint (HCC) 02/05/2015  . Hypothyroidism 02/05/2015  . Obese 01/30/2015  . S/P right THA, AA 01/29/2015  . Rectal bleeding   . Anticoagulation adequate   . Stroke (HCC)   . Dehydration 08/29/2014  . Nausea and vomiting 08/29/2014  . Diarrhea 08/29/2014  . Hypokalemia 08/29/2014  . Acute renal failure (HCC) 08/29/2014  . Leukocytosis 08/29/2014  . History of TIA (transient ischemic attack) 08/18/2014  . Essential hypertension -well controlled 01/10/2013  . Permanent atrial fibrillation (HCC); CHA2DS2-VASc Score 6   . Chronic anticoagulation   . Dyslipidemia, goal LDL below 100 - due aortic stenosis   . Moderate aortic stenosis 08/17/2009    Orientation  RESPIRATION BLADDER Height & Weight     Self  Normal Incontinent Weight: 154 lb 5.2 oz (70 kg) Height:  5\' 2"  (157.5 cm)  BEHAVIORAL SYMPTOMS/MOOD NEUROLOGICAL BOWEL NUTRITION STATUS      Continent Diet(Cardiac)  AMBULATORY STATUS COMMUNICATION OF NEEDS Skin   Limited Assist Verbally Normal                       Personal Care Assistance Level of Assistance  Dressing, Feeding, Bathing Bathing Assistance: Limited assistance Feeding assistance: Limited assistance Dressing Assistance: Limited assistance     Functional Limitations Info  Hearing, Speech, Sight Sight Info: Adequate Hearing Info: Impaired(Impaired in both ears) Speech Info: Adequate    SPECIAL CARE FACTORS FREQUENCY  PT (By licensed PT), OT (By licensed OT), Speech therapy     PT Frequency: 3x WK with Home Health  OT Frequency: 3x WK with Home Health      Speech Therapy Frequency: 3x WK with Home Health       Contractures Contractures Info: Not present    Additional Factors Info  Allergies, Code Status Code Status Info: DNR Allergies Info:  Tetanus Antitoxin, Tramadol, Augmentin Amoxicillin-pot Clavulanate, Codeine, Livalo Pitavastatin, Meclizine           Current Medications (07/06/2018):  This is the current hospital active medication list Current Facility-Administered Medications  Medication Dose Route Frequency Provider Last Rate Last Dose  . 0.9 %  sodium chloride infusion   Intravenous Continuous Pieter Partridge, MD 50 mL/hr at 07/03/18 1903    . acetaminophen (TYLENOL) tablet 650 mg  650 mg Oral Q6H PRN Leandro Reasoner  Tublu, MD   650 mg at 07/03/18 2153  . apixaban (ELIQUIS) tablet 5 mg  5 mg Oral BID Ilda Basset, [place]   5 mg at 07/06/18 3086  . atorvastatin (LIPITOR) tablet 10 mg  10 mg Oral q1800 Calvert Cantor, MD   10 mg at 07/05/18 1859  . bisoprolol (ZEBETA) tablet 10 mg  10 mg Oral Daily Leandro Reasoner Tublu, MD   10 mg at 07/06/18 0931  . cyanocobalamin  ((VITAMIN B-12)) injection 1,000 mcg  1,000 mcg Subcutaneous Daily Calvert Cantor, MD   1,000 mcg at 07/06/18 0932  . docusate sodium (COLACE) capsule 100 mg  100 mg Oral BID Leandro Reasoner Tublu, MD   100 mg at 07/06/18 0931  . feeding supplement (ENSURE ENLIVE) (ENSURE ENLIVE) liquid 237 mL  237 mL Oral BID BM Amin, Ankit Chirag, MD   237 mL at 07/06/18 0931  . gadobutrol (GADAVIST) 1 MMOL/ML injection 7 mL  7 mL Intravenous Once PRN Leandro Reasoner Tublu, MD      . Influenza vac split quadrivalent PF (FLUZONE HIGH-DOSE) injection 0.5 mL  0.5 mL Intramuscular Tomorrow-1000 Rizwan, Saima, MD      . levothyroxine (SYNTHROID, LEVOTHROID) tablet 125 mcg  125 mcg Oral QAC breakfast Pieter Partridge, MD   125 mcg at 07/06/18 0752  . QUEtiapine (SEROQUEL) tablet 12.5 mg  12.5 mg Oral QHS Calvert Cantor, MD   12.5 mg at 07/05/18 2209  . senna (SENOKOT) tablet 8.6 mg  1 tablet Oral Daily Leandro Reasoner Tublu, MD   8.6 mg at 07/06/18 0931  . trimethoprim (TRIMPEX) tablet 100 mg  100 mg Oral Daily Leandro Reasoner Tublu, MD   100 mg at 07/06/18 0932     Discharge Medications: STOP taking these medications   polyethylene glycol packet Commonly known as:  MIRALAX / GLYCOLAX   Rivaroxaban 15 MG Tabs tablet Commonly known as:  XARELTO   trimethoprim 100 MG tablet Commonly known as:  TRIMPEX     TAKE these medications   acetaminophen 325 MG tablet Commonly known as:  TYLENOL Take 2 tablets (650 mg total) by mouth every 6 (six) hours as needed for mild pain or moderate pain.   apixaban 5 MG Tabs tablet Commonly known as:  ELIQUIS Take 1 tablet (5 mg total) by mouth 2 (two) times daily.   atorvastatin 10 MG tablet Commonly known as:  LIPITOR Take 1 tablet (10 mg total) by mouth daily at 6 PM.   bisoprolol 10 MG tablet Commonly known as:  ZEBETA Take 1 tablet (10 mg total) by mouth daily.   cholecalciferol 1000 units tablet Commonly known as:  VITAMIN D Take  1,000 Units by mouth every morning.   Cranberry 250 MG Caps Take 250 mg by mouth daily.   docusate sodium 100 MG capsule Commonly known as:  COLACE Take 1 capsule (100 mg total) by mouth 2 (two) times daily.   feeding supplement (ENSURE ENLIVE) Liqd Take 237 mLs by mouth 2 (two) times daily between meals.   iron polysaccharides 150 MG capsule Commonly known as:  NIFEREX Take 1 capsule (150 mg total) by mouth daily.   levothyroxine 125 MCG tablet Commonly known as:  SYNTHROID, LEVOTHROID Take 125 mcg by mouth daily before breakfast.   senna 8.6 MG tablet Commonly known as:  SENOKOT Take 1 tablet by mouth daily.     Relevant Imaging Results:  Relevant Lab Results:   Additional Information [id number]  Baldemar Lenis, [place]

## 2018-07-06 NOTE — Care Management Note (Signed)
Case Management Note  Patient Details  Name: Makayla Thomas MRN: [id number] Date of Birth: 1925/08/23  Subjective/Objective:                    Action/Plan: Pt discharging back to [facility] today with orders for [facility] services. [facility] uses [facility] for [employer]. CM called Tiffany with [facility] and she accepted the referral.  CSW will arrange transport back to facility.  Expected Discharge Date:  07/06/18               Expected Discharge Plan:  Skilled Nursing Facility  In-House Referral:  Clinical Social Work  Discharge planning Services  CM Consult  Post Acute Care Choice:  Home Health Choice offered to:  ([facility])  DME Arranged:    DME Agency:     HH Arranged:    HH Agency:  [facility] ([facility]  Status of Service:  Completed, signed off  If discussed at [facility] [employer], dates discussed:    Additional Comments:  Kermit Balo, RN 07/06/2018, 12:04 PM

## 2018-07-06 NOTE — Care Management Important Message (Signed)
Important Message  Patient Details  Name: Makayla Thomas MRN: [id number] Date of Birth: 10-Jan-1926   Medicare Important Message Given:  Yes    Cythnia Osmun 07/06/2018, 3:06 PM

## 2018-07-06 NOTE — Progress Notes (Signed)
Occupational Therapy Treatment Patient Details Name: Makayla Thomas MRN: [id number] DOB: 01/17/1926 Today's Date: 07/06/2018    History of present illness 84 y.o. female  With PMH stroke ( 2011/2012), HTN, HLD a. Fib ( xarelto), PAD, Hypothyroidism who presented to [facility] ED as a code stroke from [facility] with c/o right leg weakness and slurred speech. She fell in Aug 2019 sustaining L femur and L ankle fx, undergoing ORIF on both 04/14/18. MRI revealed two small acute infarcts in the left centrum semiovale and left parietal lobe cortex with no hemorrhage or mass effect.    OT comments  Pt limited in treatment session today secondary to fatigue and lethergy. Pt participated in ADL retraining for bed level grooming, eating and repositioning in bed after initial attempts/goal to sit EOB & transfer to chair - pt declined stating feeling too sleepy and closed her eyes.  .   Follow Up Recommendations  SNF    Equipment Recommendations  Other (comment)(Defer to next venue)    Recommendations for Other Services      Precautions / Restrictions Precautions Precautions: Fall       Mobility Bed Mobility Overal bed mobility: Needs Assistance Bed Mobility: Rolling Rolling: +2 for physical assistance         General bed mobility comments: Pt was +2 to reposition in bed this morning and for rolling to place pad & elevate RUE secondary to edema.  Transfers Overall transfer level: (Pt was initially agreeable to transfer to chair but then reported feeling sleepy and closing her eyes. Pt stated "I better not try that right now". Deferred transfer secondary to lethergy and for safety reasons.)                    Balance Overall balance assessment: (Pt deferred sitting EOB secondary to lethergy)                                         ADL either performed or assessed with clinical judgement   ADL Overall ADL's : Needs assistance/impaired Eating/Feeding: Bed  level;Minimal assistance;Moderate assistance   Grooming: Wash/dry hands;Wash/dry face;Bed level;Minimal assistance;Moderate assistance Grooming Details (indicate cue type and reason): Using L hand. VC's and Min TC's to initiate with Min-Mod physical assist to lift LUE at elbow for pt wash face.                               General ADL Comments: Pt was agreeable to trasnfer from bed to chair this morning then she was noted to be very lethargic, RN and NT state that pt has been this way, did neuro assessment and pt stated she was tired and requested repositioning in bed followed by assistance w/ repositioning in bed & eating. Overall Min-Mod A grooming at bed level and self feeding secondary to lethergy and fatigue.     Vision Baseline Vision/History: Wears glasses Wears Glasses: At all times Patient Visual Report: No change from baseline     Perception     Praxis      Cognition Arousal/Alertness: Lethargic;Awake/alert(Easily arousable ) Behavior During Therapy: WFL for tasks assessed/performed Overall Cognitive Status: Impaired/Different from baseline Area of Impairment: Safety/judgement;Problem solving                         Safety/Judgement: Decreased awareness  of deficits;Decreased awareness of safety   Problem Solving: Decreased initiation;Difficulty sequencing;Requires verbal cues;Slow processing General Comments: Disoriented to time. Follows simple commands consistently. Decreased safety. Word finding difficulty.         Exercises     Shoulder Instructions       General Comments      Pertinent Vitals/ Pain       Pain Assessment: Faces Faces Pain Scale: Hurts a little bit Pain Location: With bed mobility/facial grimmace Pain Descriptors / Indicators: Grimacing Pain Intervention(s): Limited activity within patient's tolerance;Repositioned;Monitored during session  Home Living                                           Prior Functioning/Environment              Frequency  Min 2X/week        Progress Toward Goals  OT Goals(current goals can now be found in the care plan section)  Progress towards OT goals: Progressing toward goals  Acute Rehab OT Goals Patient Stated Goal: Get better  Plan Discharge plan remains appropriate    Co-evaluation                 AM-PAC PT "6 Clicks" Daily Activity     Outcome Measure   Help from another person eating meals?: A Lot Help from another person taking care of personal grooming?: A Lot Help from another person toileting, which includes using toliet, bedpan, or urinal?: Total Help from another person bathing (including washing, rinsing, drying)?: Total Help from another person to put on and taking off regular upper body clothing?: A Lot Help from another person to put on and taking off regular lower body clothing?: Total 6 Click Score: 9    End of Session    OT Visit Diagnosis: Hemiplegia and hemiparesis Hemiplegia - Right/Left: Right Hemiplegia - dominant/non-dominant: Dominant Hemiplegia - caused by: Cerebral infarction   Activity Tolerance Patient limited by fatigue;Patient limited by lethargy   Patient Left in bed;with call bell/phone within reach;with bed alarm set;with nursing/sitter in room   Nurse Communication Other (comment)(Pt declined transfer to chair secondary to fatigue and lethergy)        Time: 4098-1191 OT Time Calculation (min): 24 min  Charges: OT General Charges $OT Visit: 1 Visit OT Treatments $Self Care/Home Management : 23-37 mins    Lorelai Huyser Beth Dixon, OTR/L 07/06/2018, 9:03 AM

## 2018-07-06 NOTE — Progress Notes (Signed)
Patient being discharged by stretcher via [facility]. Report given to facility. Pt. IV removed. Discharged paperwork transported to facility via [facility]. Pt. Left in stable condition. Son at bedside.

## 2018-07-09 DIAGNOSIS — E039 Hypothyroidism, unspecified: Secondary | ICD-10-CM | POA: Diagnosis not present

## 2018-07-09 DIAGNOSIS — Z96652 Presence of left artificial knee joint: Secondary | ICD-10-CM | POA: Diagnosis not present

## 2018-07-09 DIAGNOSIS — I69328 Other speech and language deficits following cerebral infarction: Secondary | ICD-10-CM | POA: Diagnosis not present

## 2018-07-09 DIAGNOSIS — I69392 Facial weakness following cerebral infarction: Secondary | ICD-10-CM | POA: Diagnosis not present

## 2018-07-09 DIAGNOSIS — D649 Anemia, unspecified: Secondary | ICD-10-CM | POA: Diagnosis not present

## 2018-07-09 DIAGNOSIS — E1151 Type 2 diabetes mellitus with diabetic peripheral angiopathy without gangrene: Secondary | ICD-10-CM | POA: Diagnosis not present

## 2018-07-09 DIAGNOSIS — F0280 Dementia in other diseases classified elsewhere without behavioral disturbance: Secondary | ICD-10-CM | POA: Diagnosis not present

## 2018-07-09 DIAGNOSIS — D509 Iron deficiency anemia, unspecified: Secondary | ICD-10-CM | POA: Diagnosis not present

## 2018-07-09 DIAGNOSIS — Z9181 History of falling: Secondary | ICD-10-CM | POA: Diagnosis not present

## 2018-07-09 DIAGNOSIS — I10 Essential (primary) hypertension: Secondary | ICD-10-CM | POA: Diagnosis not present

## 2018-07-09 DIAGNOSIS — G3184 Mild cognitive impairment, so stated: Secondary | ICD-10-CM | POA: Diagnosis not present

## 2018-07-09 DIAGNOSIS — I69341 Monoplegia of lower limb following cerebral infarction affecting right dominant side: Secondary | ICD-10-CM | POA: Diagnosis not present

## 2018-07-09 DIAGNOSIS — H539 Unspecified visual disturbance: Secondary | ICD-10-CM | POA: Diagnosis not present

## 2018-07-09 DIAGNOSIS — I350 Nonrheumatic aortic (valve) stenosis: Secondary | ICD-10-CM | POA: Diagnosis not present

## 2018-07-09 DIAGNOSIS — Z7901 Long term (current) use of anticoagulants: Secondary | ICD-10-CM | POA: Diagnosis not present

## 2018-07-09 DIAGNOSIS — S7292XD Unspecified fracture of left femur, subsequent encounter for closed fracture with routine healing: Secondary | ICD-10-CM | POA: Diagnosis not present

## 2018-07-09 DIAGNOSIS — I4821 Permanent atrial fibrillation: Secondary | ICD-10-CM | POA: Diagnosis not present

## 2018-07-09 DIAGNOSIS — E785 Hyperlipidemia, unspecified: Secondary | ICD-10-CM | POA: Diagnosis not present

## 2018-07-09 DIAGNOSIS — Z96642 Presence of left artificial hip joint: Secondary | ICD-10-CM | POA: Diagnosis not present

## 2018-07-09 DIAGNOSIS — M150 Primary generalized (osteo)arthritis: Secondary | ICD-10-CM | POA: Diagnosis not present

## 2018-07-09 DIAGNOSIS — S82851D Displaced trimalleolar fracture of right lower leg, subsequent encounter for closed fracture with routine healing: Secondary | ICD-10-CM | POA: Diagnosis not present

## 2018-07-11 DIAGNOSIS — M150 Primary generalized (osteo)arthritis: Secondary | ICD-10-CM | POA: Diagnosis not present

## 2018-07-11 DIAGNOSIS — S7292XD Unspecified fracture of left femur, subsequent encounter for closed fracture with routine healing: Secondary | ICD-10-CM | POA: Diagnosis not present

## 2018-07-11 DIAGNOSIS — S82851D Displaced trimalleolar fracture of right lower leg, subsequent encounter for closed fracture with routine healing: Secondary | ICD-10-CM | POA: Diagnosis not present

## 2018-07-11 DIAGNOSIS — I69328 Other speech and language deficits following cerebral infarction: Secondary | ICD-10-CM | POA: Diagnosis not present

## 2018-07-11 DIAGNOSIS — I69392 Facial weakness following cerebral infarction: Secondary | ICD-10-CM | POA: Diagnosis not present

## 2018-07-11 DIAGNOSIS — I69341 Monoplegia of lower limb following cerebral infarction affecting right dominant side: Secondary | ICD-10-CM | POA: Diagnosis not present

## 2018-07-12 DIAGNOSIS — I69328 Other speech and language deficits following cerebral infarction: Secondary | ICD-10-CM | POA: Diagnosis not present

## 2018-07-12 DIAGNOSIS — S7292XD Unspecified fracture of left femur, subsequent encounter for closed fracture with routine healing: Secondary | ICD-10-CM | POA: Diagnosis not present

## 2018-07-12 DIAGNOSIS — I69341 Monoplegia of lower limb following cerebral infarction affecting right dominant side: Secondary | ICD-10-CM | POA: Diagnosis not present

## 2018-07-12 DIAGNOSIS — M150 Primary generalized (osteo)arthritis: Secondary | ICD-10-CM | POA: Diagnosis not present

## 2018-07-12 DIAGNOSIS — I69392 Facial weakness following cerebral infarction: Secondary | ICD-10-CM | POA: Diagnosis not present

## 2018-07-12 DIAGNOSIS — S82851D Displaced trimalleolar fracture of right lower leg, subsequent encounter for closed fracture with routine healing: Secondary | ICD-10-CM | POA: Diagnosis not present

## 2018-07-13 DIAGNOSIS — I69341 Monoplegia of lower limb following cerebral infarction affecting right dominant side: Secondary | ICD-10-CM | POA: Diagnosis not present

## 2018-07-13 DIAGNOSIS — S82851D Displaced trimalleolar fracture of right lower leg, subsequent encounter for closed fracture with routine healing: Secondary | ICD-10-CM | POA: Diagnosis not present

## 2018-07-13 DIAGNOSIS — I69328 Other speech and language deficits following cerebral infarction: Secondary | ICD-10-CM | POA: Diagnosis not present

## 2018-07-13 DIAGNOSIS — M150 Primary generalized (osteo)arthritis: Secondary | ICD-10-CM | POA: Diagnosis not present

## 2018-07-13 DIAGNOSIS — S7292XD Unspecified fracture of left femur, subsequent encounter for closed fracture with routine healing: Secondary | ICD-10-CM | POA: Diagnosis not present

## 2018-07-13 DIAGNOSIS — I69392 Facial weakness following cerebral infarction: Secondary | ICD-10-CM | POA: Diagnosis not present

## 2018-07-18 DIAGNOSIS — S7292XD Unspecified fracture of left femur, subsequent encounter for closed fracture with routine healing: Secondary | ICD-10-CM | POA: Diagnosis not present

## 2018-07-18 DIAGNOSIS — I69341 Monoplegia of lower limb following cerebral infarction affecting right dominant side: Secondary | ICD-10-CM | POA: Diagnosis not present

## 2018-07-18 DIAGNOSIS — M150 Primary generalized (osteo)arthritis: Secondary | ICD-10-CM | POA: Diagnosis not present

## 2018-07-18 DIAGNOSIS — S82851D Displaced trimalleolar fracture of right lower leg, subsequent encounter for closed fracture with routine healing: Secondary | ICD-10-CM | POA: Diagnosis not present

## 2018-07-18 DIAGNOSIS — I69328 Other speech and language deficits following cerebral infarction: Secondary | ICD-10-CM | POA: Diagnosis not present

## 2018-07-18 DIAGNOSIS — I69392 Facial weakness following cerebral infarction: Secondary | ICD-10-CM | POA: Diagnosis not present

## 2018-07-20 DIAGNOSIS — I69341 Monoplegia of lower limb following cerebral infarction affecting right dominant side: Secondary | ICD-10-CM | POA: Diagnosis not present

## 2018-07-20 DIAGNOSIS — S7292XD Unspecified fracture of left femur, subsequent encounter for closed fracture with routine healing: Secondary | ICD-10-CM | POA: Diagnosis not present

## 2018-07-20 DIAGNOSIS — S82851D Displaced trimalleolar fracture of right lower leg, subsequent encounter for closed fracture with routine healing: Secondary | ICD-10-CM | POA: Diagnosis not present

## 2018-07-20 DIAGNOSIS — M150 Primary generalized (osteo)arthritis: Secondary | ICD-10-CM | POA: Diagnosis not present

## 2018-07-20 DIAGNOSIS — I69328 Other speech and language deficits following cerebral infarction: Secondary | ICD-10-CM | POA: Diagnosis not present

## 2018-07-20 DIAGNOSIS — I69392 Facial weakness following cerebral infarction: Secondary | ICD-10-CM | POA: Diagnosis not present

## 2018-07-21 DIAGNOSIS — S7292XD Unspecified fracture of left femur, subsequent encounter for closed fracture with routine healing: Secondary | ICD-10-CM | POA: Diagnosis not present

## 2018-07-21 DIAGNOSIS — I69341 Monoplegia of lower limb following cerebral infarction affecting right dominant side: Secondary | ICD-10-CM | POA: Diagnosis not present

## 2018-07-21 DIAGNOSIS — I69392 Facial weakness following cerebral infarction: Secondary | ICD-10-CM | POA: Diagnosis not present

## 2018-07-21 DIAGNOSIS — I69328 Other speech and language deficits following cerebral infarction: Secondary | ICD-10-CM | POA: Diagnosis not present

## 2018-07-21 DIAGNOSIS — M150 Primary generalized (osteo)arthritis: Secondary | ICD-10-CM | POA: Diagnosis not present

## 2018-07-21 DIAGNOSIS — S82851D Displaced trimalleolar fracture of right lower leg, subsequent encounter for closed fracture with routine healing: Secondary | ICD-10-CM | POA: Diagnosis not present

## 2018-07-22 DIAGNOSIS — S7292XD Unspecified fracture of left femur, subsequent encounter for closed fracture with routine healing: Secondary | ICD-10-CM | POA: Diagnosis not present

## 2018-07-22 DIAGNOSIS — I69328 Other speech and language deficits following cerebral infarction: Secondary | ICD-10-CM | POA: Diagnosis not present

## 2018-07-22 DIAGNOSIS — I69392 Facial weakness following cerebral infarction: Secondary | ICD-10-CM | POA: Diagnosis not present

## 2018-07-22 DIAGNOSIS — I69341 Monoplegia of lower limb following cerebral infarction affecting right dominant side: Secondary | ICD-10-CM | POA: Diagnosis not present

## 2018-07-22 DIAGNOSIS — S82851D Displaced trimalleolar fracture of right lower leg, subsequent encounter for closed fracture with routine healing: Secondary | ICD-10-CM | POA: Diagnosis not present

## 2018-07-22 DIAGNOSIS — M150 Primary generalized (osteo)arthritis: Secondary | ICD-10-CM | POA: Diagnosis not present

## 2018-07-25 DIAGNOSIS — E039 Hypothyroidism, unspecified: Secondary | ICD-10-CM | POA: Diagnosis not present

## 2018-07-25 DIAGNOSIS — E559 Vitamin D deficiency, unspecified: Secondary | ICD-10-CM | POA: Diagnosis not present

## 2018-07-25 DIAGNOSIS — M810 Age-related osteoporosis without current pathological fracture: Secondary | ICD-10-CM | POA: Diagnosis not present

## 2018-07-25 DIAGNOSIS — I10 Essential (primary) hypertension: Secondary | ICD-10-CM | POA: Diagnosis not present

## 2018-07-26 DIAGNOSIS — I69341 Monoplegia of lower limb following cerebral infarction affecting right dominant side: Secondary | ICD-10-CM | POA: Diagnosis not present

## 2018-07-26 DIAGNOSIS — S7292XD Unspecified fracture of left femur, subsequent encounter for closed fracture with routine healing: Secondary | ICD-10-CM | POA: Diagnosis not present

## 2018-07-26 DIAGNOSIS — S82851D Displaced trimalleolar fracture of right lower leg, subsequent encounter for closed fracture with routine healing: Secondary | ICD-10-CM | POA: Diagnosis not present

## 2018-07-26 DIAGNOSIS — I69328 Other speech and language deficits following cerebral infarction: Secondary | ICD-10-CM | POA: Diagnosis not present

## 2018-07-26 DIAGNOSIS — M150 Primary generalized (osteo)arthritis: Secondary | ICD-10-CM | POA: Diagnosis not present

## 2018-07-26 DIAGNOSIS — I69392 Facial weakness following cerebral infarction: Secondary | ICD-10-CM | POA: Diagnosis not present

## 2018-07-26 DIAGNOSIS — N390 Urinary tract infection, site not specified: Secondary | ICD-10-CM | POA: Diagnosis not present

## 2018-07-28 DIAGNOSIS — I69328 Other speech and language deficits following cerebral infarction: Secondary | ICD-10-CM | POA: Diagnosis not present

## 2018-07-28 DIAGNOSIS — M150 Primary generalized (osteo)arthritis: Secondary | ICD-10-CM | POA: Diagnosis not present

## 2018-07-28 DIAGNOSIS — I69392 Facial weakness following cerebral infarction: Secondary | ICD-10-CM | POA: Diagnosis not present

## 2018-07-28 DIAGNOSIS — I69341 Monoplegia of lower limb following cerebral infarction affecting right dominant side: Secondary | ICD-10-CM | POA: Diagnosis not present

## 2018-07-28 DIAGNOSIS — S82851D Displaced trimalleolar fracture of right lower leg, subsequent encounter for closed fracture with routine healing: Secondary | ICD-10-CM | POA: Diagnosis not present

## 2018-07-28 DIAGNOSIS — S7292XD Unspecified fracture of left femur, subsequent encounter for closed fracture with routine healing: Secondary | ICD-10-CM | POA: Diagnosis not present

## 2018-07-29 DIAGNOSIS — I69392 Facial weakness following cerebral infarction: Secondary | ICD-10-CM | POA: Diagnosis not present

## 2018-07-29 DIAGNOSIS — I69341 Monoplegia of lower limb following cerebral infarction affecting right dominant side: Secondary | ICD-10-CM | POA: Diagnosis not present

## 2018-07-29 DIAGNOSIS — I69328 Other speech and language deficits following cerebral infarction: Secondary | ICD-10-CM | POA: Diagnosis not present

## 2018-07-29 DIAGNOSIS — S7292XD Unspecified fracture of left femur, subsequent encounter for closed fracture with routine healing: Secondary | ICD-10-CM | POA: Diagnosis not present

## 2018-07-29 DIAGNOSIS — M150 Primary generalized (osteo)arthritis: Secondary | ICD-10-CM | POA: Diagnosis not present

## 2018-07-29 DIAGNOSIS — S82851D Displaced trimalleolar fracture of right lower leg, subsequent encounter for closed fracture with routine healing: Secondary | ICD-10-CM | POA: Diagnosis not present

## 2018-08-01 DIAGNOSIS — I69392 Facial weakness following cerebral infarction: Secondary | ICD-10-CM | POA: Diagnosis not present

## 2018-08-01 DIAGNOSIS — S82851D Displaced trimalleolar fracture of right lower leg, subsequent encounter for closed fracture with routine healing: Secondary | ICD-10-CM | POA: Diagnosis not present

## 2018-08-01 DIAGNOSIS — I69328 Other speech and language deficits following cerebral infarction: Secondary | ICD-10-CM | POA: Diagnosis not present

## 2018-08-01 DIAGNOSIS — S7292XD Unspecified fracture of left femur, subsequent encounter for closed fracture with routine healing: Secondary | ICD-10-CM | POA: Diagnosis not present

## 2018-08-01 DIAGNOSIS — I69341 Monoplegia of lower limb following cerebral infarction affecting right dominant side: Secondary | ICD-10-CM | POA: Diagnosis not present

## 2018-08-01 DIAGNOSIS — M150 Primary generalized (osteo)arthritis: Secondary | ICD-10-CM | POA: Diagnosis not present

## 2018-08-02 DIAGNOSIS — I482 Chronic atrial fibrillation, unspecified: Secondary | ICD-10-CM | POA: Diagnosis not present

## 2018-08-02 DIAGNOSIS — E1129 Type 2 diabetes mellitus with other diabetic kidney complication: Secondary | ICD-10-CM | POA: Diagnosis not present

## 2018-08-02 DIAGNOSIS — Z8673 Personal history of transient ischemic attack (TIA), and cerebral infarction without residual deficits: Secondary | ICD-10-CM | POA: Diagnosis not present

## 2018-08-02 DIAGNOSIS — I69341 Monoplegia of lower limb following cerebral infarction affecting right dominant side: Secondary | ICD-10-CM | POA: Diagnosis not present

## 2018-08-02 DIAGNOSIS — Z0000 Encounter for general adult medical examination without abnormal findings: Secondary | ICD-10-CM | POA: Diagnosis not present

## 2018-08-02 DIAGNOSIS — M810 Age-related osteoporosis without current pathological fracture: Secondary | ICD-10-CM | POA: Diagnosis not present

## 2018-08-02 DIAGNOSIS — M150 Primary generalized (osteo)arthritis: Secondary | ICD-10-CM | POA: Diagnosis not present

## 2018-08-02 DIAGNOSIS — I69328 Other speech and language deficits following cerebral infarction: Secondary | ICD-10-CM | POA: Diagnosis not present

## 2018-08-02 DIAGNOSIS — K5900 Constipation, unspecified: Secondary | ICD-10-CM | POA: Diagnosis not present

## 2018-08-02 DIAGNOSIS — I69392 Facial weakness following cerebral infarction: Secondary | ICD-10-CM | POA: Diagnosis not present

## 2018-08-02 DIAGNOSIS — S82851D Displaced trimalleolar fracture of right lower leg, subsequent encounter for closed fracture with routine healing: Secondary | ICD-10-CM | POA: Diagnosis not present

## 2018-08-02 DIAGNOSIS — S7292XD Unspecified fracture of left femur, subsequent encounter for closed fracture with routine healing: Secondary | ICD-10-CM | POA: Diagnosis not present

## 2018-08-02 DIAGNOSIS — E785 Hyperlipidemia, unspecified: Secondary | ICD-10-CM | POA: Diagnosis not present

## 2018-08-02 DIAGNOSIS — R600 Localized edema: Secondary | ICD-10-CM | POA: Diagnosis not present

## 2018-08-02 DIAGNOSIS — R748 Abnormal levels of other serum enzymes: Secondary | ICD-10-CM | POA: Diagnosis not present

## 2018-08-02 DIAGNOSIS — E039 Hypothyroidism, unspecified: Secondary | ICD-10-CM | POA: Diagnosis not present

## 2018-08-02 DIAGNOSIS — I10 Essential (primary) hypertension: Secondary | ICD-10-CM | POA: Diagnosis not present

## 2018-08-03 DIAGNOSIS — I69392 Facial weakness following cerebral infarction: Secondary | ICD-10-CM | POA: Diagnosis not present

## 2018-08-03 DIAGNOSIS — M150 Primary generalized (osteo)arthritis: Secondary | ICD-10-CM | POA: Diagnosis not present

## 2018-08-03 DIAGNOSIS — I69341 Monoplegia of lower limb following cerebral infarction affecting right dominant side: Secondary | ICD-10-CM | POA: Diagnosis not present

## 2018-08-03 DIAGNOSIS — I69328 Other speech and language deficits following cerebral infarction: Secondary | ICD-10-CM | POA: Diagnosis not present

## 2018-08-03 DIAGNOSIS — S82851D Displaced trimalleolar fracture of right lower leg, subsequent encounter for closed fracture with routine healing: Secondary | ICD-10-CM | POA: Diagnosis not present

## 2018-08-03 DIAGNOSIS — S7292XD Unspecified fracture of left femur, subsequent encounter for closed fracture with routine healing: Secondary | ICD-10-CM | POA: Diagnosis not present

## 2018-08-04 DIAGNOSIS — I69392 Facial weakness following cerebral infarction: Secondary | ICD-10-CM | POA: Diagnosis not present

## 2018-08-04 DIAGNOSIS — I69341 Monoplegia of lower limb following cerebral infarction affecting right dominant side: Secondary | ICD-10-CM | POA: Diagnosis not present

## 2018-08-04 DIAGNOSIS — S7292XD Unspecified fracture of left femur, subsequent encounter for closed fracture with routine healing: Secondary | ICD-10-CM | POA: Diagnosis not present

## 2018-08-04 DIAGNOSIS — I69328 Other speech and language deficits following cerebral infarction: Secondary | ICD-10-CM | POA: Diagnosis not present

## 2018-08-04 DIAGNOSIS — M150 Primary generalized (osteo)arthritis: Secondary | ICD-10-CM | POA: Diagnosis not present

## 2018-08-04 DIAGNOSIS — S82851D Displaced trimalleolar fracture of right lower leg, subsequent encounter for closed fracture with routine healing: Secondary | ICD-10-CM | POA: Diagnosis not present

## 2018-08-08 DIAGNOSIS — I69341 Monoplegia of lower limb following cerebral infarction affecting right dominant side: Secondary | ICD-10-CM | POA: Diagnosis not present

## 2018-08-08 DIAGNOSIS — I69328 Other speech and language deficits following cerebral infarction: Secondary | ICD-10-CM | POA: Diagnosis not present

## 2018-08-08 DIAGNOSIS — S82851D Displaced trimalleolar fracture of right lower leg, subsequent encounter for closed fracture with routine healing: Secondary | ICD-10-CM | POA: Diagnosis not present

## 2018-08-08 DIAGNOSIS — S7292XD Unspecified fracture of left femur, subsequent encounter for closed fracture with routine healing: Secondary | ICD-10-CM | POA: Diagnosis not present

## 2018-08-08 DIAGNOSIS — I69392 Facial weakness following cerebral infarction: Secondary | ICD-10-CM | POA: Diagnosis not present

## 2018-08-08 DIAGNOSIS — M150 Primary generalized (osteo)arthritis: Secondary | ICD-10-CM | POA: Diagnosis not present

## 2018-08-09 DIAGNOSIS — M150 Primary generalized (osteo)arthritis: Secondary | ICD-10-CM | POA: Diagnosis not present

## 2018-08-09 DIAGNOSIS — I69392 Facial weakness following cerebral infarction: Secondary | ICD-10-CM | POA: Diagnosis not present

## 2018-08-09 DIAGNOSIS — I69328 Other speech and language deficits following cerebral infarction: Secondary | ICD-10-CM | POA: Diagnosis not present

## 2018-08-09 DIAGNOSIS — S7292XD Unspecified fracture of left femur, subsequent encounter for closed fracture with routine healing: Secondary | ICD-10-CM | POA: Diagnosis not present

## 2018-08-09 DIAGNOSIS — S82851D Displaced trimalleolar fracture of right lower leg, subsequent encounter for closed fracture with routine healing: Secondary | ICD-10-CM | POA: Diagnosis not present

## 2018-08-09 DIAGNOSIS — I69341 Monoplegia of lower limb following cerebral infarction affecting right dominant side: Secondary | ICD-10-CM | POA: Diagnosis not present

## 2018-08-11 ENCOUNTER — Ambulatory Visit (INDEPENDENT_AMBULATORY_CARE_PROVIDER_SITE_OTHER): Payer: Medicare Other | Admitting: Adult Health

## 2018-08-11 ENCOUNTER — Encounter: Payer: Self-pay | Admitting: Adult Health

## 2018-08-11 VITALS — BP 111/73 | HR 67 | Ht 62.0 in

## 2018-08-11 DIAGNOSIS — E785 Hyperlipidemia, unspecified: Secondary | ICD-10-CM | POA: Diagnosis not present

## 2018-08-11 DIAGNOSIS — I63412 Cerebral infarction due to embolism of left middle cerebral artery: Secondary | ICD-10-CM | POA: Diagnosis not present

## 2018-08-11 DIAGNOSIS — I69341 Monoplegia of lower limb following cerebral infarction affecting right dominant side: Secondary | ICD-10-CM | POA: Diagnosis not present

## 2018-08-11 DIAGNOSIS — I69392 Facial weakness following cerebral infarction: Secondary | ICD-10-CM | POA: Diagnosis not present

## 2018-08-11 DIAGNOSIS — I10 Essential (primary) hypertension: Secondary | ICD-10-CM | POA: Diagnosis not present

## 2018-08-11 DIAGNOSIS — S7292XD Unspecified fracture of left femur, subsequent encounter for closed fracture with routine healing: Secondary | ICD-10-CM | POA: Diagnosis not present

## 2018-08-11 DIAGNOSIS — I69319 Unspecified symptoms and signs involving cognitive functions following cerebral infarction: Secondary | ICD-10-CM

## 2018-08-11 DIAGNOSIS — M150 Primary generalized (osteo)arthritis: Secondary | ICD-10-CM | POA: Diagnosis not present

## 2018-08-11 DIAGNOSIS — I69328 Other speech and language deficits following cerebral infarction: Secondary | ICD-10-CM | POA: Diagnosis not present

## 2018-08-11 DIAGNOSIS — S82851D Displaced trimalleolar fracture of right lower leg, subsequent encounter for closed fracture with routine healing: Secondary | ICD-10-CM | POA: Diagnosis not present

## 2018-08-11 NOTE — Patient Instructions (Signed)
Continue Eliquis (apixaban) daily  and lipitor  for secondary stroke prevention  Continue to follow up with PCP regarding cholesterol and blood pressure management   Continue therapy for continued weakness and cognitive deficits   Continue to monitor blood pressure at home  Maintain strict control of hypertension with blood pressure goal below 130/90, diabetes with hemoglobin A1c goal below 6.5% and cholesterol with LDL cholesterol (bad cholesterol) goal below 70 mg/dL. I also advised the patient to eat a healthy diet with plenty of whole grains, cereals, fruits and vegetables, exercise regularly and maintain ideal body weight.  Followup in the future with me in 3 months or call earlier if needed       Thank you for coming to see [place] at [facility]. I hope we have been able to provide you high quality care today.  You may receive a patient satisfaction survey over the next few weeks. We would appreciate your feedback and comments so that we may continue to improve ourselves and the health of our patients.

## 2018-08-11 NOTE — Progress Notes (Signed)
[facility] [address] [place]. [place] [address] [phone]       OFFICE FOLLOW UP NOTE  [name] Date of Birth:  [birth year] Medical Record Number:  [id number]   Reason for Referral:  hospital stroke follow up  CHIEF COMPLAINT:  Chief Complaint  Patient presents with  . Follow-up    Stroke follow up. Son and daughter in Sports coach. Rm 9. Patient stated that she feels fine. Son mentioned that she has been haing cognitive issues. She had a stroke a month ago and stated that she has now moved into assisted living.    HPI: Makayla Thomas is being seen today for initial visit in the office for small left centrum semiovale and left parietal lobe infarcts embolic secondary to known A. fib on 07/03/2018. History obtained from patient, son, daughter in law, and chart review. Reviewed all radiology images and labs personally.  Makayla Thomas is a 84 y.o. female with history of stroke ( 2011/2012), HTN, HLD a. Fib (xarelto), PAD, Hypothyroidism, dementia who is w/c boun  present with right facial droop, slurred speech, right leg weakness.  CT head reviewed was negative for acute infarct but did show small vessel disease and atrophy.  MRI brain reviewed and showed 2 small infarcts left CVS and left parietal lobe.  MRA head negative.  MRA neck did not show good quality imaging therefore carotid Doppler obtained and showed bilateral ICA stenosis of 1 to 39% and VAs antegrade.  2D echo showed EF of 60 to 65% without cardiac source of meals identified.  LDL 110 and A1c 5.2.  Patient was on Xarelto PTA for history of atrial fibrillation but it was recommended to switch to Eliquis for secondary stroke prevention.  Recommended initiating atorvastatin 10 mg daily for LDL management.  HTN stable during admission and recommended long-term BP goal normotensive range.  Patient was discharged to SNF for continued therapies.  Patient is being seen today for hospital follow-up and is  accompanied by her son and daughter-in-law.  She is currently residing at [facility] assisted living facility.  She continues to receive PT/OT/ST.  Continues to have some right leg weakness and worsening cognitive deficits but overall family feels as though she has been improving.  She does have underlying dementia and was having assistance in her home for medication administration as she was continuously forgetting to take but now is receiving assistance with majority of her ADLs.  She has primarily wheelchair-bound at this time but is able to stand for 1 to 2 minutes.  She is unable to take any steps at this time but continues to work on the school with therapy.  She continues on Eliquis without side effects of bleeding or bruising.  Continues on atorvastatin without side effects of myalgias.  Blood pressure today satisfactory at 111/73.  No further concerns at this time.  Denies new or worsening stroke/TIA symptoms.     ROS:   14 system review of systems performed and negative with exception of swelling in legs, hearing loss and diarrhea  PMH:  Past Medical History:  Diagnosis Date  . Anemia   . Aortic stenosis, moderate (was mild) 08/2009; 08/2014   a. ECHO  EF 55-60%, wth increased EDP, mild AS;; b. mild Conc LVH, EF 60-65%, DD with high LVEDP, MIld-Mod AS, Mod MAC with Mod MR, mild-mod PA HTN.  [name] Complication of anesthesia    hallusinations  . Constipation   . Diabetes mellitus without complication ([place])  possibly  . Dyslipidemia    treated  . H/O cardiovascular stress test 05/2010   Lexiscan cardiolite no ischemia or infarction  . H/O multiple sclerosis    no excaerbations in some time  . History of ETT 02/2011   [place] exercise protocol, negative with poor exercise tolerance  . History of transfusion   . Hypertension   . Hypothyroidism   . Lower extremity edema    chronic  . Osteoarthritis    "generalized" (04/13/2018)  . PAD (peripheral artery disease) ([place]) 08/30/2009    Dopplers; with bil. post. tib artery occlusion  . Permanent atrial fibrillation    on Xarelto  . Stroke [facility]) ~2011/2012   "memory issues since" (04/13/2018)  . Transient ischemic attack (TIA) "she's had several"   "memory issues since" (04/13/2018)  . Vaginal itching   . Visual disturbances    "FLASHING IN MY EYES"    PSH:  Past Surgical History:  Procedure Laterality Date  . BREAST SURGERY     L tumor removed - benign  . CATARACT EXTRACTION, BILATERAL Bilateral   . CESAREAN SECTION  1952; 1956; 1959  . CHOLECYSTECTOMY    . JOINT REPLACEMENT    . ORIF ANKLE FRACTURE Left 04/14/2018   Procedure: OPEN REDUCTION INTERNAL FIXATION (ORIF) ANKLE FRACTURE;  Surgeon: Shona Needles, MD;  Location: [place];  Service: Orthopedics;  Laterality: Left;  . ORIF FEMUR FRACTURE Left 04/14/2018   Procedure: OPEN REDUCTION INTERNAL FIXATION (ORIF) DISTAL FEMUR FRACTURE;  Surgeon: Shona Needles, MD;  Location: [place];  Service: Orthopedics;  Laterality: Left;  . ORIF PERIPROSTHETIC FRACTURE Right 02/07/2015   Procedure: OPEN REDUCTION INTERNAL FIXATION (ORIF) PERIPROSTHETIC FRACTURE WITH TOTAL HIP REVISION AND CABLES;  Surgeon: Paralee Cancel, MD;  Location: [facility];  Service: Orthopedics;  Laterality: Right;  . THYROIDECTOMY, PARTIAL Left   . TOTAL HIP ARTHROPLASTY Right 01/29/2015   Procedure: RIGHT TOTAL HIP ARTHROPLASTY ANTERIOR APPROACH;  Surgeon: Paralee Cancel, MD;  Location: [facility];  Service: Orthopedics;  Laterality: Right;  . TOTAL KNEE ARTHROPLASTY Left 2012    Social History:  Social History   Socioeconomic History  . Marital status: Widowed    Spouse name: Not on file  . Number of children: 3  . Years of education: HS  . Highest education level: Not on file  Occupational History  . Occupation: Retired  Scientific laboratory technician  . Financial resource strain: Not on file  . Food insecurity:    Worry: Not on file    Inability: Not on file  . Transportation needs:    Medical: Not on file     Non-medical: Not on file  Tobacco Use  . Smoking status: Never Smoker  . Smokeless tobacco: Never Used  Substance and Sexual Activity  . Alcohol use: No    Alcohol/week: 0.0 standard drinks  . Drug use: No    Types: Fentanyl  . Sexual activity: Not on file  Lifestyle  . Physical activity:    Days per week: Not on file    Minutes per session: Not on file  . Stress: Not on file  Relationships  . Social connections:    Talks on phone: Not on file    Gets together: Not on file    Attends religious service: Not on file    Active member of club or organization: Not on file    Attends meetings of clubs or organizations: Not on file    Relationship status: Not on file  . Intimate partner  violence:    Fear of current or ex partner: Not on file    Emotionally abused: Not on file    Physically abused: Not on file    Forced sexual activity: Not on file  Other Topics Concern  . Not on file  Social History Narrative   Uses a walker to walk   Drinks 1-2 cups of coffee a day     Family History:  Family History  Problem Relation Age of Onset  . Heart failure Father 32  . Lung cancer Father     Medications:   Current Outpatient Medications on File Prior to Visit  Medication Sig Dispense Refill  . acetaminophen (TYLENOL) 325 MG tablet Take 2 tablets (650 mg total) by mouth every 6 (six) hours as needed for mild pain or moderate pain. 30 tablet 0  . apixaban (ELIQUIS) 5 MG TABS tablet Take 5 mg by mouth 2 (two) times daily.    [name] atorvastatin (LIPITOR) 10 MG tablet Take 10 mg by mouth daily.    . bisoprolol (ZEBETA) 10 MG tablet Take 1 tablet (10 mg total) by mouth daily. 30 tablet 0  . cholecalciferol (VITAMIN D) 1000 UNITS tablet Take 1,000 Units by mouth every morning.     . docusate sodium (COLACE) 100 MG capsule Take 1 capsule (100 mg total) by mouth 2 (two) times daily. 10 capsule 0  . feeding supplement, ENSURE ENLIVE, (ENSURE ENLIVE) LIQD Take 237 mLs by mouth 2 (two) times  daily between meals. 237 mL 12  . levothyroxine (SYNTHROID, LEVOTHROID) 125 MCG tablet Take 125 mcg by mouth daily before breakfast.    . senna (SENOKOT) 8.6 MG tablet Take 1 tablet by mouth daily.     No current facility-administered medications on file prior to visit.     Allergies:   Allergies  Allergen Reactions  . Tetanus Antitoxin Anaphylaxis    Throat swelling  . Tramadol Other (See Comments)    HALLUCINATIONS; "and paranoia"  . Augmentin [Amoxicillin-Pot Clavulanate]     UNSPECIFIED REACTION   . Codeine     UNSPECIFIED REACTION   . Livalo [Pitavastatin]     UNSPECIFIED REACTION   . Meclizine Other (See Comments)    Had funny feeling     Physical Exam  Vitals:   08/11/18 1531  BP: 111/73  Pulse: 67  Height: _0  (1.575 m)   Body mass index is 28.23 kg/m. No exam data present  General: well developed, well nourished, pleasant elderly Caucasian female, seated, in no evident distress Head: head normocephalic and atraumatic.   Neck: supple with no carotid or supraclavicular bruits Cardiovascular: regular rate and rhythm, no murmurs Musculoskeletal: no deformity Skin:  no rash/petichiae Vascular:  Normal pulses all extremities  Neurologic Exam Mental Status: Awake and fully alert. Oriented to place but disoriented to time. Remote memory intact. Attention span, concentration and fund of knowledge appropriate for the most part but some difficulty comprehending or understanding. Mood and affect appropriate.  AFT 8.  Difficulty with serial additions.  Recall 1/3. Cranial Nerves: Fundoscopic exam reveals sharp disc margins. Pupils equal, briskly reactive to light. Extraocular movements full without nystagmus. Visual fields full to confrontation. Hearing intact. Facial sensation intact. Face, tongue, palate moves normally and symmetrically.  Motor: Normal bulk and tone.  RLE: 4+ to 5/5 full strength in all other tested extremities Sensory.: intact to touch , pinprick ,  position and vibratory sensation.  Coordination: Rapid alternating movements normal in all extremities. Finger-to-nose performed accurately bilaterally  but unable to perform heel-to-shin bilaterally Gait and Station: Currently sitting in wheelchair and is unable to ambulate at this time therefore gait assessment deferred Reflexes: 1+ and symmetric. Toes downgoing.    NIHSS  1 Modified Rankin  3 CHA2DS2-VASc 6 HAS-BLED 2   Diagnostic Data (Labs, Imaging, Testing)  CT HEAD WO CONTRAST 07/03/2018 IMPRESSION: 1. No evidence of acute intracranial abnormality. 2. ASPECTS is 10. 3. Moderate chronic small vessel ischemic disease and cerebral atrophy.  MR BRAIN WO CONTRAST MR MRA HEAD  07/03/2018 IMPRESSION: 1. There are two small acute infarcts in the left centrum semiovale and left parietal lobe cortex with no hemorrhage or mass effect. 2. No other acute intracranial abnormality. Underlying advanced signal changes in the brain compatible with chronic small vessel disease. 3. Intracranial MRA is negative for large vessel occlusion or proximal branch stenosis. 4. Neck MRA was attempted with 7 mL IV Gadavist but for unclear technical reasons was non-diagnostic and is not being charged.      ASSESSMENT: Makayla Thomas is a 84 y.o. year old female here with small left centrum semiovale and left parietal lobe infarcts on 07/03/2018 secondary to atrial fibrillation. Vascular risk factors include history of stroke, atrial fibrillation, HTN, HLD and PAD and dementia.  Patient is being seen today for hospital follow-up and has been recovering well with residual deficits of mild left lower extremity weakness and worsening cognitive deficit.    PLAN:  1. Embolic infarcts: Continue Eliquis (apixaban) daily  and atorvastatin 10 mg for secondary stroke prevention. Maintain strict control of hypertension with blood pressure goal below 130/90, diabetes with hemoglobin A1c goal below 6.5% and  cholesterol with LDL cholesterol (bad cholesterol) goal below 70 mg/dL.  I also advised the patient to eat a healthy diet with plenty of whole grains, cereals, fruits and vegetables, exercise regularly with at least 30 minutes of continuous activity daily and maintain ideal body weight.  Recommend to continue PT/OT/ST for continued deficits.  Advised son and daughter-in-law that if she continues to have worsening cognitive deficits 3 to 6 months post stroke, it is recommended for further evaluation by PCP and possible need for medication such as Aricept or Namenda 2. Atrial fibrillation: Continue Eliquis along with continued follow-up with established cardiologist for management 3. HTN: Advised to continue current treatment regimen.  Today's BP 111/73.  Advised to continue to monitor at home along with continued follow-up with PCP for management 4. HLD: Advised to continue current treatment regimen along with continued follow-up with PCP for future prescribing and monitoring of lipid panel     Follow up in 3 months or call earlier if needed   Greater than 50% of time during this 25 minute visit was spent on counseling, explanation of diagnosis of embolic infarcts, reviewing risk factor management of HTN, HLD, planning of further management along with potential future management, and discussion with patient and family answering all questions.    Venancio Poisson, AGNP-BC  [facility] [address] [place], [place] [address]  Phone [phone] Fax [phone] Note: This document was prepared with digital dictation and possible smart phrase technology. Any transcriptional errors that result from this process are unintentional.

## 2018-08-12 DIAGNOSIS — I69341 Monoplegia of lower limb following cerebral infarction affecting right dominant side: Secondary | ICD-10-CM | POA: Diagnosis not present

## 2018-08-12 DIAGNOSIS — I69392 Facial weakness following cerebral infarction: Secondary | ICD-10-CM | POA: Diagnosis not present

## 2018-08-12 DIAGNOSIS — S7292XD Unspecified fracture of left femur, subsequent encounter for closed fracture with routine healing: Secondary | ICD-10-CM | POA: Diagnosis not present

## 2018-08-12 DIAGNOSIS — M150 Primary generalized (osteo)arthritis: Secondary | ICD-10-CM | POA: Diagnosis not present

## 2018-08-12 DIAGNOSIS — S82851D Displaced trimalleolar fracture of right lower leg, subsequent encounter for closed fracture with routine healing: Secondary | ICD-10-CM | POA: Diagnosis not present

## 2018-08-12 DIAGNOSIS — I69328 Other speech and language deficits following cerebral infarction: Secondary | ICD-10-CM | POA: Diagnosis not present

## 2018-08-15 DIAGNOSIS — S82851D Displaced trimalleolar fracture of right lower leg, subsequent encounter for closed fracture with routine healing: Secondary | ICD-10-CM | POA: Diagnosis not present

## 2018-08-15 DIAGNOSIS — I69392 Facial weakness following cerebral infarction: Secondary | ICD-10-CM | POA: Diagnosis not present

## 2018-08-15 DIAGNOSIS — M150 Primary generalized (osteo)arthritis: Secondary | ICD-10-CM | POA: Diagnosis not present

## 2018-08-15 DIAGNOSIS — I69328 Other speech and language deficits following cerebral infarction: Secondary | ICD-10-CM | POA: Diagnosis not present

## 2018-08-15 DIAGNOSIS — S7292XD Unspecified fracture of left femur, subsequent encounter for closed fracture with routine healing: Secondary | ICD-10-CM | POA: Diagnosis not present

## 2018-08-15 DIAGNOSIS — I69341 Monoplegia of lower limb following cerebral infarction affecting right dominant side: Secondary | ICD-10-CM | POA: Diagnosis not present

## 2018-08-17 DIAGNOSIS — I69392 Facial weakness following cerebral infarction: Secondary | ICD-10-CM | POA: Diagnosis not present

## 2018-08-17 DIAGNOSIS — S7292XD Unspecified fracture of left femur, subsequent encounter for closed fracture with routine healing: Secondary | ICD-10-CM | POA: Diagnosis not present

## 2018-08-17 DIAGNOSIS — M150 Primary generalized (osteo)arthritis: Secondary | ICD-10-CM | POA: Diagnosis not present

## 2018-08-17 DIAGNOSIS — S82851D Displaced trimalleolar fracture of right lower leg, subsequent encounter for closed fracture with routine healing: Secondary | ICD-10-CM | POA: Diagnosis not present

## 2018-08-17 DIAGNOSIS — I69341 Monoplegia of lower limb following cerebral infarction affecting right dominant side: Secondary | ICD-10-CM | POA: Diagnosis not present

## 2018-08-17 DIAGNOSIS — I69328 Other speech and language deficits following cerebral infarction: Secondary | ICD-10-CM | POA: Diagnosis not present

## 2018-08-18 DIAGNOSIS — S82851D Displaced trimalleolar fracture of right lower leg, subsequent encounter for closed fracture with routine healing: Secondary | ICD-10-CM | POA: Diagnosis not present

## 2018-08-18 DIAGNOSIS — S7292XD Unspecified fracture of left femur, subsequent encounter for closed fracture with routine healing: Secondary | ICD-10-CM | POA: Diagnosis not present

## 2018-08-18 DIAGNOSIS — I69392 Facial weakness following cerebral infarction: Secondary | ICD-10-CM | POA: Diagnosis not present

## 2018-08-18 DIAGNOSIS — I69328 Other speech and language deficits following cerebral infarction: Secondary | ICD-10-CM | POA: Diagnosis not present

## 2018-08-18 DIAGNOSIS — I69341 Monoplegia of lower limb following cerebral infarction affecting right dominant side: Secondary | ICD-10-CM | POA: Diagnosis not present

## 2018-08-18 DIAGNOSIS — M150 Primary generalized (osteo)arthritis: Secondary | ICD-10-CM | POA: Diagnosis not present

## 2018-08-19 DIAGNOSIS — S82851D Displaced trimalleolar fracture of right lower leg, subsequent encounter for closed fracture with routine healing: Secondary | ICD-10-CM | POA: Diagnosis not present

## 2018-08-19 DIAGNOSIS — M150 Primary generalized (osteo)arthritis: Secondary | ICD-10-CM | POA: Diagnosis not present

## 2018-08-19 DIAGNOSIS — I69392 Facial weakness following cerebral infarction: Secondary | ICD-10-CM | POA: Diagnosis not present

## 2018-08-19 DIAGNOSIS — I69328 Other speech and language deficits following cerebral infarction: Secondary | ICD-10-CM | POA: Diagnosis not present

## 2018-08-19 DIAGNOSIS — I69341 Monoplegia of lower limb following cerebral infarction affecting right dominant side: Secondary | ICD-10-CM | POA: Diagnosis not present

## 2018-08-19 DIAGNOSIS — S7292XD Unspecified fracture of left femur, subsequent encounter for closed fracture with routine healing: Secondary | ICD-10-CM | POA: Diagnosis not present

## 2018-08-21 NOTE — Progress Notes (Signed)
I agree with the above plan 

## 2018-08-22 DIAGNOSIS — S72452D Displaced supracondylar fracture without intracondylar extension of lower end of left femur, subsequent encounter for closed fracture with routine healing: Secondary | ICD-10-CM | POA: Diagnosis not present

## 2018-08-22 DIAGNOSIS — S82852D Displaced trimalleolar fracture of left lower leg, subsequent encounter for closed fracture with routine healing: Secondary | ICD-10-CM | POA: Diagnosis not present

## 2018-08-22 DIAGNOSIS — S93432D Sprain of tibiofibular ligament of left ankle, subsequent encounter: Secondary | ICD-10-CM | POA: Diagnosis not present

## 2018-08-24 DIAGNOSIS — I69341 Monoplegia of lower limb following cerebral infarction affecting right dominant side: Secondary | ICD-10-CM | POA: Diagnosis not present

## 2018-08-24 DIAGNOSIS — S7292XD Unspecified fracture of left femur, subsequent encounter for closed fracture with routine healing: Secondary | ICD-10-CM | POA: Diagnosis not present

## 2018-08-24 DIAGNOSIS — I69328 Other speech and language deficits following cerebral infarction: Secondary | ICD-10-CM | POA: Diagnosis not present

## 2018-08-24 DIAGNOSIS — I69392 Facial weakness following cerebral infarction: Secondary | ICD-10-CM | POA: Diagnosis not present

## 2018-08-24 DIAGNOSIS — S82851D Displaced trimalleolar fracture of right lower leg, subsequent encounter for closed fracture with routine healing: Secondary | ICD-10-CM | POA: Diagnosis not present

## 2018-08-24 DIAGNOSIS — M150 Primary generalized (osteo)arthritis: Secondary | ICD-10-CM | POA: Diagnosis not present

## 2018-08-25 DIAGNOSIS — M150 Primary generalized (osteo)arthritis: Secondary | ICD-10-CM | POA: Diagnosis not present

## 2018-08-25 DIAGNOSIS — S82851D Displaced trimalleolar fracture of right lower leg, subsequent encounter for closed fracture with routine healing: Secondary | ICD-10-CM | POA: Diagnosis not present

## 2018-08-25 DIAGNOSIS — I69341 Monoplegia of lower limb following cerebral infarction affecting right dominant side: Secondary | ICD-10-CM | POA: Diagnosis not present

## 2018-08-25 DIAGNOSIS — I69392 Facial weakness following cerebral infarction: Secondary | ICD-10-CM | POA: Diagnosis not present

## 2018-08-25 DIAGNOSIS — S7292XD Unspecified fracture of left femur, subsequent encounter for closed fracture with routine healing: Secondary | ICD-10-CM | POA: Diagnosis not present

## 2018-08-25 DIAGNOSIS — I69328 Other speech and language deficits following cerebral infarction: Secondary | ICD-10-CM | POA: Diagnosis not present

## 2018-08-26 DIAGNOSIS — I69392 Facial weakness following cerebral infarction: Secondary | ICD-10-CM | POA: Diagnosis not present

## 2018-08-26 DIAGNOSIS — I69341 Monoplegia of lower limb following cerebral infarction affecting right dominant side: Secondary | ICD-10-CM | POA: Diagnosis not present

## 2018-08-26 DIAGNOSIS — S82851D Displaced trimalleolar fracture of right lower leg, subsequent encounter for closed fracture with routine healing: Secondary | ICD-10-CM | POA: Diagnosis not present

## 2018-08-26 DIAGNOSIS — S7292XD Unspecified fracture of left femur, subsequent encounter for closed fracture with routine healing: Secondary | ICD-10-CM | POA: Diagnosis not present

## 2018-08-26 DIAGNOSIS — M150 Primary generalized (osteo)arthritis: Secondary | ICD-10-CM | POA: Diagnosis not present

## 2018-08-26 DIAGNOSIS — I69328 Other speech and language deficits following cerebral infarction: Secondary | ICD-10-CM | POA: Diagnosis not present

## 2018-08-29 DIAGNOSIS — M150 Primary generalized (osteo)arthritis: Secondary | ICD-10-CM | POA: Diagnosis not present

## 2018-08-29 DIAGNOSIS — S7292XD Unspecified fracture of left femur, subsequent encounter for closed fracture with routine healing: Secondary | ICD-10-CM | POA: Diagnosis not present

## 2018-08-29 DIAGNOSIS — S82851D Displaced trimalleolar fracture of right lower leg, subsequent encounter for closed fracture with routine healing: Secondary | ICD-10-CM | POA: Diagnosis not present

## 2018-08-29 DIAGNOSIS — I69328 Other speech and language deficits following cerebral infarction: Secondary | ICD-10-CM | POA: Diagnosis not present

## 2018-08-29 DIAGNOSIS — I69392 Facial weakness following cerebral infarction: Secondary | ICD-10-CM | POA: Diagnosis not present

## 2018-08-29 DIAGNOSIS — I69341 Monoplegia of lower limb following cerebral infarction affecting right dominant side: Secondary | ICD-10-CM | POA: Diagnosis not present

## 2018-08-30 DIAGNOSIS — M859 Disorder of bone density and structure, unspecified: Secondary | ICD-10-CM | POA: Diagnosis not present

## 2018-08-30 DIAGNOSIS — M8580 Other specified disorders of bone density and structure, unspecified site: Secondary | ICD-10-CM | POA: Diagnosis not present

## 2018-08-30 DIAGNOSIS — M8589 Other specified disorders of bone density and structure, multiple sites: Secondary | ICD-10-CM | POA: Diagnosis not present

## 2018-08-31 DIAGNOSIS — S7292XD Unspecified fracture of left femur, subsequent encounter for closed fracture with routine healing: Secondary | ICD-10-CM | POA: Diagnosis not present

## 2018-08-31 DIAGNOSIS — S82851D Displaced trimalleolar fracture of right lower leg, subsequent encounter for closed fracture with routine healing: Secondary | ICD-10-CM | POA: Diagnosis not present

## 2018-08-31 DIAGNOSIS — I69392 Facial weakness following cerebral infarction: Secondary | ICD-10-CM | POA: Diagnosis not present

## 2018-08-31 DIAGNOSIS — I69328 Other speech and language deficits following cerebral infarction: Secondary | ICD-10-CM | POA: Diagnosis not present

## 2018-08-31 DIAGNOSIS — I69341 Monoplegia of lower limb following cerebral infarction affecting right dominant side: Secondary | ICD-10-CM | POA: Diagnosis not present

## 2018-08-31 DIAGNOSIS — M150 Primary generalized (osteo)arthritis: Secondary | ICD-10-CM | POA: Diagnosis not present

## 2018-09-01 DIAGNOSIS — I69392 Facial weakness following cerebral infarction: Secondary | ICD-10-CM | POA: Diagnosis not present

## 2018-09-01 DIAGNOSIS — S7292XD Unspecified fracture of left femur, subsequent encounter for closed fracture with routine healing: Secondary | ICD-10-CM | POA: Diagnosis not present

## 2018-09-01 DIAGNOSIS — I69328 Other speech and language deficits following cerebral infarction: Secondary | ICD-10-CM | POA: Diagnosis not present

## 2018-09-01 DIAGNOSIS — I69341 Monoplegia of lower limb following cerebral infarction affecting right dominant side: Secondary | ICD-10-CM | POA: Diagnosis not present

## 2018-09-01 DIAGNOSIS — M150 Primary generalized (osteo)arthritis: Secondary | ICD-10-CM | POA: Diagnosis not present

## 2018-09-01 DIAGNOSIS — S82851D Displaced trimalleolar fracture of right lower leg, subsequent encounter for closed fracture with routine healing: Secondary | ICD-10-CM | POA: Diagnosis not present

## 2018-09-06 DIAGNOSIS — I69328 Other speech and language deficits following cerebral infarction: Secondary | ICD-10-CM | POA: Diagnosis not present

## 2018-09-06 DIAGNOSIS — I69341 Monoplegia of lower limb following cerebral infarction affecting right dominant side: Secondary | ICD-10-CM | POA: Diagnosis not present

## 2018-09-06 DIAGNOSIS — M150 Primary generalized (osteo)arthritis: Secondary | ICD-10-CM | POA: Diagnosis not present

## 2018-09-06 DIAGNOSIS — S82851D Displaced trimalleolar fracture of right lower leg, subsequent encounter for closed fracture with routine healing: Secondary | ICD-10-CM | POA: Diagnosis not present

## 2018-09-06 DIAGNOSIS — S7292XD Unspecified fracture of left femur, subsequent encounter for closed fracture with routine healing: Secondary | ICD-10-CM | POA: Diagnosis not present

## 2018-09-06 DIAGNOSIS — E559 Vitamin D deficiency, unspecified: Secondary | ICD-10-CM | POA: Diagnosis not present

## 2018-09-06 DIAGNOSIS — M810 Age-related osteoporosis without current pathological fracture: Secondary | ICD-10-CM | POA: Diagnosis not present

## 2018-09-06 DIAGNOSIS — I69392 Facial weakness following cerebral infarction: Secondary | ICD-10-CM | POA: Diagnosis not present

## 2018-09-16 DIAGNOSIS — R296 Repeated falls: Secondary | ICD-10-CM | POA: Diagnosis not present

## 2018-09-16 DIAGNOSIS — R262 Difficulty in walking, not elsewhere classified: Secondary | ICD-10-CM | POA: Diagnosis not present

## 2018-09-16 DIAGNOSIS — R2681 Unsteadiness on feet: Secondary | ICD-10-CM | POA: Diagnosis not present

## 2018-09-16 DIAGNOSIS — M6281 Muscle weakness (generalized): Secondary | ICD-10-CM | POA: Diagnosis not present

## 2018-09-19 DIAGNOSIS — R296 Repeated falls: Secondary | ICD-10-CM | POA: Diagnosis not present

## 2018-09-19 DIAGNOSIS — R41841 Cognitive communication deficit: Secondary | ICD-10-CM | POA: Diagnosis not present

## 2018-09-19 DIAGNOSIS — R262 Difficulty in walking, not elsewhere classified: Secondary | ICD-10-CM | POA: Diagnosis not present

## 2018-09-19 DIAGNOSIS — G459 Transient cerebral ischemic attack, unspecified: Secondary | ICD-10-CM | POA: Diagnosis not present

## 2018-09-19 DIAGNOSIS — M6281 Muscle weakness (generalized): Secondary | ICD-10-CM | POA: Diagnosis not present

## 2018-09-19 DIAGNOSIS — R2681 Unsteadiness on feet: Secondary | ICD-10-CM | POA: Diagnosis not present

## 2018-09-20 DIAGNOSIS — R41841 Cognitive communication deficit: Secondary | ICD-10-CM | POA: Diagnosis not present

## 2018-09-20 DIAGNOSIS — M6281 Muscle weakness (generalized): Secondary | ICD-10-CM | POA: Diagnosis not present

## 2018-09-20 DIAGNOSIS — R262 Difficulty in walking, not elsewhere classified: Secondary | ICD-10-CM | POA: Diagnosis not present

## 2018-09-20 DIAGNOSIS — R2681 Unsteadiness on feet: Secondary | ICD-10-CM | POA: Diagnosis not present

## 2018-09-20 DIAGNOSIS — R296 Repeated falls: Secondary | ICD-10-CM | POA: Diagnosis not present

## 2018-09-20 DIAGNOSIS — G459 Transient cerebral ischemic attack, unspecified: Secondary | ICD-10-CM | POA: Diagnosis not present

## 2018-09-21 DIAGNOSIS — R262 Difficulty in walking, not elsewhere classified: Secondary | ICD-10-CM | POA: Diagnosis not present

## 2018-09-21 DIAGNOSIS — R296 Repeated falls: Secondary | ICD-10-CM | POA: Diagnosis not present

## 2018-09-21 DIAGNOSIS — R2681 Unsteadiness on feet: Secondary | ICD-10-CM | POA: Diagnosis not present

## 2018-09-21 DIAGNOSIS — R41841 Cognitive communication deficit: Secondary | ICD-10-CM | POA: Diagnosis not present

## 2018-09-21 DIAGNOSIS — M6281 Muscle weakness (generalized): Secondary | ICD-10-CM | POA: Diagnosis not present

## 2018-09-21 DIAGNOSIS — G459 Transient cerebral ischemic attack, unspecified: Secondary | ICD-10-CM | POA: Diagnosis not present

## 2018-09-22 DIAGNOSIS — R296 Repeated falls: Secondary | ICD-10-CM | POA: Diagnosis not present

## 2018-09-22 DIAGNOSIS — M810 Age-related osteoporosis without current pathological fracture: Secondary | ICD-10-CM | POA: Diagnosis not present

## 2018-09-22 DIAGNOSIS — R2681 Unsteadiness on feet: Secondary | ICD-10-CM | POA: Diagnosis not present

## 2018-09-22 DIAGNOSIS — G459 Transient cerebral ischemic attack, unspecified: Secondary | ICD-10-CM | POA: Diagnosis not present

## 2018-09-22 DIAGNOSIS — R41841 Cognitive communication deficit: Secondary | ICD-10-CM | POA: Diagnosis not present

## 2018-09-22 DIAGNOSIS — M6281 Muscle weakness (generalized): Secondary | ICD-10-CM | POA: Diagnosis not present

## 2018-09-22 DIAGNOSIS — R262 Difficulty in walking, not elsewhere classified: Secondary | ICD-10-CM | POA: Diagnosis not present

## 2018-09-23 DIAGNOSIS — R262 Difficulty in walking, not elsewhere classified: Secondary | ICD-10-CM | POA: Diagnosis not present

## 2018-09-23 DIAGNOSIS — R2681 Unsteadiness on feet: Secondary | ICD-10-CM | POA: Diagnosis not present

## 2018-09-23 DIAGNOSIS — G459 Transient cerebral ischemic attack, unspecified: Secondary | ICD-10-CM | POA: Diagnosis not present

## 2018-09-23 DIAGNOSIS — R41841 Cognitive communication deficit: Secondary | ICD-10-CM | POA: Diagnosis not present

## 2018-09-23 DIAGNOSIS — M6281 Muscle weakness (generalized): Secondary | ICD-10-CM | POA: Diagnosis not present

## 2018-09-23 DIAGNOSIS — R296 Repeated falls: Secondary | ICD-10-CM | POA: Diagnosis not present

## 2018-09-24 DIAGNOSIS — R262 Difficulty in walking, not elsewhere classified: Secondary | ICD-10-CM | POA: Diagnosis not present

## 2018-09-24 DIAGNOSIS — R2681 Unsteadiness on feet: Secondary | ICD-10-CM | POA: Diagnosis not present

## 2018-09-24 DIAGNOSIS — M6281 Muscle weakness (generalized): Secondary | ICD-10-CM | POA: Diagnosis not present

## 2018-09-24 DIAGNOSIS — R296 Repeated falls: Secondary | ICD-10-CM | POA: Diagnosis not present

## 2018-09-24 DIAGNOSIS — G459 Transient cerebral ischemic attack, unspecified: Secondary | ICD-10-CM | POA: Diagnosis not present

## 2018-09-24 DIAGNOSIS — R41841 Cognitive communication deficit: Secondary | ICD-10-CM | POA: Diagnosis not present

## 2018-09-26 DIAGNOSIS — M6281 Muscle weakness (generalized): Secondary | ICD-10-CM | POA: Diagnosis not present

## 2018-09-26 DIAGNOSIS — R2681 Unsteadiness on feet: Secondary | ICD-10-CM | POA: Diagnosis not present

## 2018-09-26 DIAGNOSIS — R41841 Cognitive communication deficit: Secondary | ICD-10-CM | POA: Diagnosis not present

## 2018-09-26 DIAGNOSIS — R296 Repeated falls: Secondary | ICD-10-CM | POA: Diagnosis not present

## 2018-09-26 DIAGNOSIS — R262 Difficulty in walking, not elsewhere classified: Secondary | ICD-10-CM | POA: Diagnosis not present

## 2018-09-26 DIAGNOSIS — G459 Transient cerebral ischemic attack, unspecified: Secondary | ICD-10-CM | POA: Diagnosis not present

## 2018-09-27 DIAGNOSIS — G459 Transient cerebral ischemic attack, unspecified: Secondary | ICD-10-CM | POA: Diagnosis not present

## 2018-09-27 DIAGNOSIS — R41841 Cognitive communication deficit: Secondary | ICD-10-CM | POA: Diagnosis not present

## 2018-09-27 DIAGNOSIS — R2681 Unsteadiness on feet: Secondary | ICD-10-CM | POA: Diagnosis not present

## 2018-09-27 DIAGNOSIS — R262 Difficulty in walking, not elsewhere classified: Secondary | ICD-10-CM | POA: Diagnosis not present

## 2018-09-27 DIAGNOSIS — R296 Repeated falls: Secondary | ICD-10-CM | POA: Diagnosis not present

## 2018-09-27 DIAGNOSIS — M6281 Muscle weakness (generalized): Secondary | ICD-10-CM | POA: Diagnosis not present

## 2018-09-28 DIAGNOSIS — G459 Transient cerebral ischemic attack, unspecified: Secondary | ICD-10-CM | POA: Diagnosis not present

## 2018-09-28 DIAGNOSIS — R296 Repeated falls: Secondary | ICD-10-CM | POA: Diagnosis not present

## 2018-09-28 DIAGNOSIS — R2681 Unsteadiness on feet: Secondary | ICD-10-CM | POA: Diagnosis not present

## 2018-09-28 DIAGNOSIS — R41841 Cognitive communication deficit: Secondary | ICD-10-CM | POA: Diagnosis not present

## 2018-09-28 DIAGNOSIS — M6281 Muscle weakness (generalized): Secondary | ICD-10-CM | POA: Diagnosis not present

## 2018-09-28 DIAGNOSIS — R262 Difficulty in walking, not elsewhere classified: Secondary | ICD-10-CM | POA: Diagnosis not present

## 2018-09-29 DIAGNOSIS — R262 Difficulty in walking, not elsewhere classified: Secondary | ICD-10-CM | POA: Diagnosis not present

## 2018-09-29 DIAGNOSIS — M6281 Muscle weakness (generalized): Secondary | ICD-10-CM | POA: Diagnosis not present

## 2018-09-29 DIAGNOSIS — R41841 Cognitive communication deficit: Secondary | ICD-10-CM | POA: Diagnosis not present

## 2018-09-29 DIAGNOSIS — R296 Repeated falls: Secondary | ICD-10-CM | POA: Diagnosis not present

## 2018-09-29 DIAGNOSIS — R2681 Unsteadiness on feet: Secondary | ICD-10-CM | POA: Diagnosis not present

## 2018-09-29 DIAGNOSIS — G459 Transient cerebral ischemic attack, unspecified: Secondary | ICD-10-CM | POA: Diagnosis not present

## 2018-09-30 DIAGNOSIS — R296 Repeated falls: Secondary | ICD-10-CM | POA: Diagnosis not present

## 2018-09-30 DIAGNOSIS — R2681 Unsteadiness on feet: Secondary | ICD-10-CM | POA: Diagnosis not present

## 2018-09-30 DIAGNOSIS — G459 Transient cerebral ischemic attack, unspecified: Secondary | ICD-10-CM | POA: Diagnosis not present

## 2018-09-30 DIAGNOSIS — R262 Difficulty in walking, not elsewhere classified: Secondary | ICD-10-CM | POA: Diagnosis not present

## 2018-09-30 DIAGNOSIS — R41841 Cognitive communication deficit: Secondary | ICD-10-CM | POA: Diagnosis not present

## 2018-09-30 DIAGNOSIS — M6281 Muscle weakness (generalized): Secondary | ICD-10-CM | POA: Diagnosis not present

## 2018-10-03 DIAGNOSIS — R41841 Cognitive communication deficit: Secondary | ICD-10-CM | POA: Diagnosis not present

## 2018-10-03 DIAGNOSIS — R262 Difficulty in walking, not elsewhere classified: Secondary | ICD-10-CM | POA: Diagnosis not present

## 2018-10-03 DIAGNOSIS — R2681 Unsteadiness on feet: Secondary | ICD-10-CM | POA: Diagnosis not present

## 2018-10-03 DIAGNOSIS — M6281 Muscle weakness (generalized): Secondary | ICD-10-CM | POA: Diagnosis not present

## 2018-10-03 DIAGNOSIS — G459 Transient cerebral ischemic attack, unspecified: Secondary | ICD-10-CM | POA: Diagnosis not present

## 2018-10-03 DIAGNOSIS — R296 Repeated falls: Secondary | ICD-10-CM | POA: Diagnosis not present

## 2018-10-04 DIAGNOSIS — R2681 Unsteadiness on feet: Secondary | ICD-10-CM | POA: Diagnosis not present

## 2018-10-04 DIAGNOSIS — M6281 Muscle weakness (generalized): Secondary | ICD-10-CM | POA: Diagnosis not present

## 2018-10-04 DIAGNOSIS — G459 Transient cerebral ischemic attack, unspecified: Secondary | ICD-10-CM | POA: Diagnosis not present

## 2018-10-04 DIAGNOSIS — R41841 Cognitive communication deficit: Secondary | ICD-10-CM | POA: Diagnosis not present

## 2018-10-04 DIAGNOSIS — R262 Difficulty in walking, not elsewhere classified: Secondary | ICD-10-CM | POA: Diagnosis not present

## 2018-10-04 DIAGNOSIS — R296 Repeated falls: Secondary | ICD-10-CM | POA: Diagnosis not present

## 2018-10-05 DIAGNOSIS — M6281 Muscle weakness (generalized): Secondary | ICD-10-CM | POA: Diagnosis not present

## 2018-10-05 DIAGNOSIS — G459 Transient cerebral ischemic attack, unspecified: Secondary | ICD-10-CM | POA: Diagnosis not present

## 2018-10-05 DIAGNOSIS — R2681 Unsteadiness on feet: Secondary | ICD-10-CM | POA: Diagnosis not present

## 2018-10-05 DIAGNOSIS — R262 Difficulty in walking, not elsewhere classified: Secondary | ICD-10-CM | POA: Diagnosis not present

## 2018-10-05 DIAGNOSIS — R296 Repeated falls: Secondary | ICD-10-CM | POA: Diagnosis not present

## 2018-10-05 DIAGNOSIS — R41841 Cognitive communication deficit: Secondary | ICD-10-CM | POA: Diagnosis not present

## 2018-10-06 DIAGNOSIS — R296 Repeated falls: Secondary | ICD-10-CM | POA: Diagnosis not present

## 2018-10-06 DIAGNOSIS — R2681 Unsteadiness on feet: Secondary | ICD-10-CM | POA: Diagnosis not present

## 2018-10-06 DIAGNOSIS — G459 Transient cerebral ischemic attack, unspecified: Secondary | ICD-10-CM | POA: Diagnosis not present

## 2018-10-06 DIAGNOSIS — M6281 Muscle weakness (generalized): Secondary | ICD-10-CM | POA: Diagnosis not present

## 2018-10-06 DIAGNOSIS — R41841 Cognitive communication deficit: Secondary | ICD-10-CM | POA: Diagnosis not present

## 2018-10-06 DIAGNOSIS — R262 Difficulty in walking, not elsewhere classified: Secondary | ICD-10-CM | POA: Diagnosis not present

## 2018-10-07 DIAGNOSIS — R296 Repeated falls: Secondary | ICD-10-CM | POA: Diagnosis not present

## 2018-10-07 DIAGNOSIS — M6281 Muscle weakness (generalized): Secondary | ICD-10-CM | POA: Diagnosis not present

## 2018-10-07 DIAGNOSIS — R41841 Cognitive communication deficit: Secondary | ICD-10-CM | POA: Diagnosis not present

## 2018-10-07 DIAGNOSIS — G459 Transient cerebral ischemic attack, unspecified: Secondary | ICD-10-CM | POA: Diagnosis not present

## 2018-10-07 DIAGNOSIS — R2681 Unsteadiness on feet: Secondary | ICD-10-CM | POA: Diagnosis not present

## 2018-10-07 DIAGNOSIS — R262 Difficulty in walking, not elsewhere classified: Secondary | ICD-10-CM | POA: Diagnosis not present

## 2018-10-10 DIAGNOSIS — R2681 Unsteadiness on feet: Secondary | ICD-10-CM | POA: Diagnosis not present

## 2018-10-10 DIAGNOSIS — G459 Transient cerebral ischemic attack, unspecified: Secondary | ICD-10-CM | POA: Diagnosis not present

## 2018-10-10 DIAGNOSIS — R262 Difficulty in walking, not elsewhere classified: Secondary | ICD-10-CM | POA: Diagnosis not present

## 2018-10-10 DIAGNOSIS — R41841 Cognitive communication deficit: Secondary | ICD-10-CM | POA: Diagnosis not present

## 2018-10-10 DIAGNOSIS — R296 Repeated falls: Secondary | ICD-10-CM | POA: Diagnosis not present

## 2018-10-10 DIAGNOSIS — M6281 Muscle weakness (generalized): Secondary | ICD-10-CM | POA: Diagnosis not present

## 2018-10-11 DIAGNOSIS — R296 Repeated falls: Secondary | ICD-10-CM | POA: Diagnosis not present

## 2018-10-11 DIAGNOSIS — R41841 Cognitive communication deficit: Secondary | ICD-10-CM | POA: Diagnosis not present

## 2018-10-11 DIAGNOSIS — G459 Transient cerebral ischemic attack, unspecified: Secondary | ICD-10-CM | POA: Diagnosis not present

## 2018-10-11 DIAGNOSIS — R262 Difficulty in walking, not elsewhere classified: Secondary | ICD-10-CM | POA: Diagnosis not present

## 2018-10-11 DIAGNOSIS — R2681 Unsteadiness on feet: Secondary | ICD-10-CM | POA: Diagnosis not present

## 2018-10-11 DIAGNOSIS — M6281 Muscle weakness (generalized): Secondary | ICD-10-CM | POA: Diagnosis not present

## 2018-10-12 DIAGNOSIS — R2681 Unsteadiness on feet: Secondary | ICD-10-CM | POA: Diagnosis not present

## 2018-10-12 DIAGNOSIS — G459 Transient cerebral ischemic attack, unspecified: Secondary | ICD-10-CM | POA: Diagnosis not present

## 2018-10-12 DIAGNOSIS — R296 Repeated falls: Secondary | ICD-10-CM | POA: Diagnosis not present

## 2018-10-12 DIAGNOSIS — M6281 Muscle weakness (generalized): Secondary | ICD-10-CM | POA: Diagnosis not present

## 2018-10-12 DIAGNOSIS — R262 Difficulty in walking, not elsewhere classified: Secondary | ICD-10-CM | POA: Diagnosis not present

## 2018-10-12 DIAGNOSIS — R41841 Cognitive communication deficit: Secondary | ICD-10-CM | POA: Diagnosis not present

## 2018-10-13 DIAGNOSIS — R41841 Cognitive communication deficit: Secondary | ICD-10-CM | POA: Diagnosis not present

## 2018-10-13 DIAGNOSIS — E039 Hypothyroidism, unspecified: Secondary | ICD-10-CM | POA: Diagnosis not present

## 2018-10-13 DIAGNOSIS — G459 Transient cerebral ischemic attack, unspecified: Secondary | ICD-10-CM | POA: Diagnosis not present

## 2018-10-13 DIAGNOSIS — R2681 Unsteadiness on feet: Secondary | ICD-10-CM | POA: Diagnosis not present

## 2018-10-13 DIAGNOSIS — R296 Repeated falls: Secondary | ICD-10-CM | POA: Diagnosis not present

## 2018-10-13 DIAGNOSIS — M6281 Muscle weakness (generalized): Secondary | ICD-10-CM | POA: Diagnosis not present

## 2018-10-13 DIAGNOSIS — R262 Difficulty in walking, not elsewhere classified: Secondary | ICD-10-CM | POA: Diagnosis not present

## 2018-10-14 DIAGNOSIS — G459 Transient cerebral ischemic attack, unspecified: Secondary | ICD-10-CM | POA: Diagnosis not present

## 2018-10-14 DIAGNOSIS — M6281 Muscle weakness (generalized): Secondary | ICD-10-CM | POA: Diagnosis not present

## 2018-10-14 DIAGNOSIS — R262 Difficulty in walking, not elsewhere classified: Secondary | ICD-10-CM | POA: Diagnosis not present

## 2018-10-14 DIAGNOSIS — R41841 Cognitive communication deficit: Secondary | ICD-10-CM | POA: Diagnosis not present

## 2018-10-14 DIAGNOSIS — R296 Repeated falls: Secondary | ICD-10-CM | POA: Diagnosis not present

## 2018-10-14 DIAGNOSIS — R2681 Unsteadiness on feet: Secondary | ICD-10-CM | POA: Diagnosis not present

## 2018-10-17 DIAGNOSIS — R262 Difficulty in walking, not elsewhere classified: Secondary | ICD-10-CM | POA: Diagnosis not present

## 2018-10-17 DIAGNOSIS — R296 Repeated falls: Secondary | ICD-10-CM | POA: Diagnosis not present

## 2018-10-17 DIAGNOSIS — R2681 Unsteadiness on feet: Secondary | ICD-10-CM | POA: Diagnosis not present

## 2018-10-17 DIAGNOSIS — G459 Transient cerebral ischemic attack, unspecified: Secondary | ICD-10-CM | POA: Diagnosis not present

## 2018-10-17 DIAGNOSIS — R41841 Cognitive communication deficit: Secondary | ICD-10-CM | POA: Diagnosis not present

## 2018-10-17 DIAGNOSIS — M6281 Muscle weakness (generalized): Secondary | ICD-10-CM | POA: Diagnosis not present

## 2018-10-18 DIAGNOSIS — R41841 Cognitive communication deficit: Secondary | ICD-10-CM | POA: Diagnosis not present

## 2018-10-18 DIAGNOSIS — R262 Difficulty in walking, not elsewhere classified: Secondary | ICD-10-CM | POA: Diagnosis not present

## 2018-10-18 DIAGNOSIS — G459 Transient cerebral ischemic attack, unspecified: Secondary | ICD-10-CM | POA: Diagnosis not present

## 2018-10-18 DIAGNOSIS — R2681 Unsteadiness on feet: Secondary | ICD-10-CM | POA: Diagnosis not present

## 2018-10-18 DIAGNOSIS — M6281 Muscle weakness (generalized): Secondary | ICD-10-CM | POA: Diagnosis not present

## 2018-10-18 DIAGNOSIS — R296 Repeated falls: Secondary | ICD-10-CM | POA: Diagnosis not present

## 2018-10-19 DIAGNOSIS — G459 Transient cerebral ischemic attack, unspecified: Secondary | ICD-10-CM | POA: Diagnosis not present

## 2018-10-19 DIAGNOSIS — R2681 Unsteadiness on feet: Secondary | ICD-10-CM | POA: Diagnosis not present

## 2018-10-19 DIAGNOSIS — M6281 Muscle weakness (generalized): Secondary | ICD-10-CM | POA: Diagnosis not present

## 2018-10-19 DIAGNOSIS — R262 Difficulty in walking, not elsewhere classified: Secondary | ICD-10-CM | POA: Diagnosis not present

## 2018-10-19 DIAGNOSIS — R41841 Cognitive communication deficit: Secondary | ICD-10-CM | POA: Diagnosis not present

## 2018-10-19 DIAGNOSIS — R296 Repeated falls: Secondary | ICD-10-CM | POA: Diagnosis not present

## 2018-10-20 DIAGNOSIS — R262 Difficulty in walking, not elsewhere classified: Secondary | ICD-10-CM | POA: Diagnosis not present

## 2018-10-20 DIAGNOSIS — R296 Repeated falls: Secondary | ICD-10-CM | POA: Diagnosis not present

## 2018-10-20 DIAGNOSIS — R41841 Cognitive communication deficit: Secondary | ICD-10-CM | POA: Diagnosis not present

## 2018-10-20 DIAGNOSIS — R2681 Unsteadiness on feet: Secondary | ICD-10-CM | POA: Diagnosis not present

## 2018-10-20 DIAGNOSIS — M6281 Muscle weakness (generalized): Secondary | ICD-10-CM | POA: Diagnosis not present

## 2018-10-20 DIAGNOSIS — G459 Transient cerebral ischemic attack, unspecified: Secondary | ICD-10-CM | POA: Diagnosis not present

## 2018-10-21 DIAGNOSIS — M6281 Muscle weakness (generalized): Secondary | ICD-10-CM | POA: Diagnosis not present

## 2018-10-21 DIAGNOSIS — G459 Transient cerebral ischemic attack, unspecified: Secondary | ICD-10-CM | POA: Diagnosis not present

## 2018-10-21 DIAGNOSIS — R262 Difficulty in walking, not elsewhere classified: Secondary | ICD-10-CM | POA: Diagnosis not present

## 2018-10-21 DIAGNOSIS — R41841 Cognitive communication deficit: Secondary | ICD-10-CM | POA: Diagnosis not present

## 2018-10-21 DIAGNOSIS — R296 Repeated falls: Secondary | ICD-10-CM | POA: Diagnosis not present

## 2018-10-21 DIAGNOSIS — R2681 Unsteadiness on feet: Secondary | ICD-10-CM | POA: Diagnosis not present

## 2018-10-24 DIAGNOSIS — G459 Transient cerebral ischemic attack, unspecified: Secondary | ICD-10-CM | POA: Diagnosis not present

## 2018-10-24 DIAGNOSIS — R41841 Cognitive communication deficit: Secondary | ICD-10-CM | POA: Diagnosis not present

## 2018-10-24 DIAGNOSIS — R296 Repeated falls: Secondary | ICD-10-CM | POA: Diagnosis not present

## 2018-10-24 DIAGNOSIS — R2681 Unsteadiness on feet: Secondary | ICD-10-CM | POA: Diagnosis not present

## 2018-10-24 DIAGNOSIS — M6281 Muscle weakness (generalized): Secondary | ICD-10-CM | POA: Diagnosis not present

## 2018-10-24 DIAGNOSIS — R262 Difficulty in walking, not elsewhere classified: Secondary | ICD-10-CM | POA: Diagnosis not present

## 2018-10-25 DIAGNOSIS — R41841 Cognitive communication deficit: Secondary | ICD-10-CM | POA: Diagnosis not present

## 2018-10-25 DIAGNOSIS — G459 Transient cerebral ischemic attack, unspecified: Secondary | ICD-10-CM | POA: Diagnosis not present

## 2018-10-25 DIAGNOSIS — R296 Repeated falls: Secondary | ICD-10-CM | POA: Diagnosis not present

## 2018-10-25 DIAGNOSIS — R262 Difficulty in walking, not elsewhere classified: Secondary | ICD-10-CM | POA: Diagnosis not present

## 2018-10-25 DIAGNOSIS — R2681 Unsteadiness on feet: Secondary | ICD-10-CM | POA: Diagnosis not present

## 2018-10-25 DIAGNOSIS — M6281 Muscle weakness (generalized): Secondary | ICD-10-CM | POA: Diagnosis not present

## 2018-10-26 DIAGNOSIS — R2681 Unsteadiness on feet: Secondary | ICD-10-CM | POA: Diagnosis not present

## 2018-10-26 DIAGNOSIS — M6281 Muscle weakness (generalized): Secondary | ICD-10-CM | POA: Diagnosis not present

## 2018-10-26 DIAGNOSIS — G459 Transient cerebral ischemic attack, unspecified: Secondary | ICD-10-CM | POA: Diagnosis not present

## 2018-10-26 DIAGNOSIS — R41841 Cognitive communication deficit: Secondary | ICD-10-CM | POA: Diagnosis not present

## 2018-10-26 DIAGNOSIS — R296 Repeated falls: Secondary | ICD-10-CM | POA: Diagnosis not present

## 2018-10-26 DIAGNOSIS — R262 Difficulty in walking, not elsewhere classified: Secondary | ICD-10-CM | POA: Diagnosis not present

## 2018-10-27 DIAGNOSIS — R262 Difficulty in walking, not elsewhere classified: Secondary | ICD-10-CM | POA: Diagnosis not present

## 2018-10-27 DIAGNOSIS — M6281 Muscle weakness (generalized): Secondary | ICD-10-CM | POA: Diagnosis not present

## 2018-10-27 DIAGNOSIS — R2681 Unsteadiness on feet: Secondary | ICD-10-CM | POA: Diagnosis not present

## 2018-10-27 DIAGNOSIS — G459 Transient cerebral ischemic attack, unspecified: Secondary | ICD-10-CM | POA: Diagnosis not present

## 2018-10-27 DIAGNOSIS — R41841 Cognitive communication deficit: Secondary | ICD-10-CM | POA: Diagnosis not present

## 2018-10-27 DIAGNOSIS — R296 Repeated falls: Secondary | ICD-10-CM | POA: Diagnosis not present

## 2018-10-28 DIAGNOSIS — R296 Repeated falls: Secondary | ICD-10-CM | POA: Diagnosis not present

## 2018-10-28 DIAGNOSIS — M6281 Muscle weakness (generalized): Secondary | ICD-10-CM | POA: Diagnosis not present

## 2018-10-28 DIAGNOSIS — R2681 Unsteadiness on feet: Secondary | ICD-10-CM | POA: Diagnosis not present

## 2018-10-28 DIAGNOSIS — R41841 Cognitive communication deficit: Secondary | ICD-10-CM | POA: Diagnosis not present

## 2018-10-28 DIAGNOSIS — G459 Transient cerebral ischemic attack, unspecified: Secondary | ICD-10-CM | POA: Diagnosis not present

## 2018-10-28 DIAGNOSIS — R262 Difficulty in walking, not elsewhere classified: Secondary | ICD-10-CM | POA: Diagnosis not present

## 2018-10-31 DIAGNOSIS — R2681 Unsteadiness on feet: Secondary | ICD-10-CM | POA: Diagnosis not present

## 2018-10-31 DIAGNOSIS — R262 Difficulty in walking, not elsewhere classified: Secondary | ICD-10-CM | POA: Diagnosis not present

## 2018-10-31 DIAGNOSIS — G459 Transient cerebral ischemic attack, unspecified: Secondary | ICD-10-CM | POA: Diagnosis not present

## 2018-10-31 DIAGNOSIS — R41841 Cognitive communication deficit: Secondary | ICD-10-CM | POA: Diagnosis not present

## 2018-10-31 DIAGNOSIS — M6281 Muscle weakness (generalized): Secondary | ICD-10-CM | POA: Diagnosis not present

## 2018-10-31 DIAGNOSIS — R296 Repeated falls: Secondary | ICD-10-CM | POA: Diagnosis not present

## 2018-11-01 DIAGNOSIS — R262 Difficulty in walking, not elsewhere classified: Secondary | ICD-10-CM | POA: Diagnosis not present

## 2018-11-01 DIAGNOSIS — M6281 Muscle weakness (generalized): Secondary | ICD-10-CM | POA: Diagnosis not present

## 2018-11-01 DIAGNOSIS — R41841 Cognitive communication deficit: Secondary | ICD-10-CM | POA: Diagnosis not present

## 2018-11-01 DIAGNOSIS — R296 Repeated falls: Secondary | ICD-10-CM | POA: Diagnosis not present

## 2018-11-01 DIAGNOSIS — G459 Transient cerebral ischemic attack, unspecified: Secondary | ICD-10-CM | POA: Diagnosis not present

## 2018-11-01 DIAGNOSIS — R2681 Unsteadiness on feet: Secondary | ICD-10-CM | POA: Diagnosis not present

## 2018-11-02 DIAGNOSIS — R262 Difficulty in walking, not elsewhere classified: Secondary | ICD-10-CM | POA: Diagnosis not present

## 2018-11-02 DIAGNOSIS — R296 Repeated falls: Secondary | ICD-10-CM | POA: Diagnosis not present

## 2018-11-02 DIAGNOSIS — M6281 Muscle weakness (generalized): Secondary | ICD-10-CM | POA: Diagnosis not present

## 2018-11-02 DIAGNOSIS — R41841 Cognitive communication deficit: Secondary | ICD-10-CM | POA: Diagnosis not present

## 2018-11-02 DIAGNOSIS — G459 Transient cerebral ischemic attack, unspecified: Secondary | ICD-10-CM | POA: Diagnosis not present

## 2018-11-02 DIAGNOSIS — R2681 Unsteadiness on feet: Secondary | ICD-10-CM | POA: Diagnosis not present

## 2018-11-03 DIAGNOSIS — G459 Transient cerebral ischemic attack, unspecified: Secondary | ICD-10-CM | POA: Diagnosis not present

## 2018-11-03 DIAGNOSIS — R262 Difficulty in walking, not elsewhere classified: Secondary | ICD-10-CM | POA: Diagnosis not present

## 2018-11-03 DIAGNOSIS — R2681 Unsteadiness on feet: Secondary | ICD-10-CM | POA: Diagnosis not present

## 2018-11-03 DIAGNOSIS — M6281 Muscle weakness (generalized): Secondary | ICD-10-CM | POA: Diagnosis not present

## 2018-11-03 DIAGNOSIS — R41841 Cognitive communication deficit: Secondary | ICD-10-CM | POA: Diagnosis not present

## 2018-11-03 DIAGNOSIS — R296 Repeated falls: Secondary | ICD-10-CM | POA: Diagnosis not present

## 2018-11-04 DIAGNOSIS — M6281 Muscle weakness (generalized): Secondary | ICD-10-CM | POA: Diagnosis not present

## 2018-11-04 DIAGNOSIS — R262 Difficulty in walking, not elsewhere classified: Secondary | ICD-10-CM | POA: Diagnosis not present

## 2018-11-04 DIAGNOSIS — R296 Repeated falls: Secondary | ICD-10-CM | POA: Diagnosis not present

## 2018-11-04 DIAGNOSIS — G459 Transient cerebral ischemic attack, unspecified: Secondary | ICD-10-CM | POA: Diagnosis not present

## 2018-11-04 DIAGNOSIS — R41841 Cognitive communication deficit: Secondary | ICD-10-CM | POA: Diagnosis not present

## 2018-11-04 DIAGNOSIS — R2681 Unsteadiness on feet: Secondary | ICD-10-CM | POA: Diagnosis not present

## 2018-11-07 ENCOUNTER — Telehealth: Payer: Self-pay | Admitting: Adult Health

## 2018-11-07 DIAGNOSIS — R296 Repeated falls: Secondary | ICD-10-CM | POA: Diagnosis not present

## 2018-11-07 DIAGNOSIS — R41841 Cognitive communication deficit: Secondary | ICD-10-CM | POA: Diagnosis not present

## 2018-11-07 DIAGNOSIS — G459 Transient cerebral ischemic attack, unspecified: Secondary | ICD-10-CM | POA: Diagnosis not present

## 2018-11-07 DIAGNOSIS — R2681 Unsteadiness on feet: Secondary | ICD-10-CM | POA: Diagnosis not present

## 2018-11-07 DIAGNOSIS — M6281 Muscle weakness (generalized): Secondary | ICD-10-CM | POA: Diagnosis not present

## 2018-11-07 DIAGNOSIS — R262 Difficulty in walking, not elsewhere classified: Secondary | ICD-10-CM | POA: Diagnosis not present

## 2018-11-07 NOTE — Telephone Encounter (Signed)
Pt's son c/a appt for Thursday with Shanda Bumps

## 2018-11-08 DIAGNOSIS — M6281 Muscle weakness (generalized): Secondary | ICD-10-CM | POA: Diagnosis not present

## 2018-11-08 DIAGNOSIS — G459 Transient cerebral ischemic attack, unspecified: Secondary | ICD-10-CM | POA: Diagnosis not present

## 2018-11-08 DIAGNOSIS — R41841 Cognitive communication deficit: Secondary | ICD-10-CM | POA: Diagnosis not present

## 2018-11-08 DIAGNOSIS — R262 Difficulty in walking, not elsewhere classified: Secondary | ICD-10-CM | POA: Diagnosis not present

## 2018-11-08 DIAGNOSIS — R296 Repeated falls: Secondary | ICD-10-CM | POA: Diagnosis not present

## 2018-11-08 DIAGNOSIS — R2681 Unsteadiness on feet: Secondary | ICD-10-CM | POA: Diagnosis not present

## 2018-11-09 DIAGNOSIS — G459 Transient cerebral ischemic attack, unspecified: Secondary | ICD-10-CM | POA: Diagnosis not present

## 2018-11-09 DIAGNOSIS — M6281 Muscle weakness (generalized): Secondary | ICD-10-CM | POA: Diagnosis not present

## 2018-11-09 DIAGNOSIS — R2681 Unsteadiness on feet: Secondary | ICD-10-CM | POA: Diagnosis not present

## 2018-11-09 DIAGNOSIS — R296 Repeated falls: Secondary | ICD-10-CM | POA: Diagnosis not present

## 2018-11-09 DIAGNOSIS — R41841 Cognitive communication deficit: Secondary | ICD-10-CM | POA: Diagnosis not present

## 2018-11-09 DIAGNOSIS — R262 Difficulty in walking, not elsewhere classified: Secondary | ICD-10-CM | POA: Diagnosis not present

## 2018-11-10 ENCOUNTER — Ambulatory Visit: Payer: Medicare Other | Admitting: Adult Health

## 2018-11-10 DIAGNOSIS — M6281 Muscle weakness (generalized): Secondary | ICD-10-CM | POA: Diagnosis not present

## 2018-11-10 DIAGNOSIS — G459 Transient cerebral ischemic attack, unspecified: Secondary | ICD-10-CM | POA: Diagnosis not present

## 2018-11-10 DIAGNOSIS — R262 Difficulty in walking, not elsewhere classified: Secondary | ICD-10-CM | POA: Diagnosis not present

## 2018-11-10 DIAGNOSIS — R296 Repeated falls: Secondary | ICD-10-CM | POA: Diagnosis not present

## 2018-11-10 DIAGNOSIS — R41841 Cognitive communication deficit: Secondary | ICD-10-CM | POA: Diagnosis not present

## 2018-11-10 DIAGNOSIS — R2681 Unsteadiness on feet: Secondary | ICD-10-CM | POA: Diagnosis not present

## 2018-11-11 DIAGNOSIS — G459 Transient cerebral ischemic attack, unspecified: Secondary | ICD-10-CM | POA: Diagnosis not present

## 2018-11-11 DIAGNOSIS — R296 Repeated falls: Secondary | ICD-10-CM | POA: Diagnosis not present

## 2018-11-11 DIAGNOSIS — R262 Difficulty in walking, not elsewhere classified: Secondary | ICD-10-CM | POA: Diagnosis not present

## 2018-11-11 DIAGNOSIS — M6281 Muscle weakness (generalized): Secondary | ICD-10-CM | POA: Diagnosis not present

## 2018-11-11 DIAGNOSIS — R2681 Unsteadiness on feet: Secondary | ICD-10-CM | POA: Diagnosis not present

## 2018-11-11 DIAGNOSIS — R41841 Cognitive communication deficit: Secondary | ICD-10-CM | POA: Diagnosis not present

## 2018-11-14 DIAGNOSIS — R41841 Cognitive communication deficit: Secondary | ICD-10-CM | POA: Diagnosis not present

## 2018-11-14 DIAGNOSIS — R2681 Unsteadiness on feet: Secondary | ICD-10-CM | POA: Diagnosis not present

## 2018-11-14 DIAGNOSIS — G459 Transient cerebral ischemic attack, unspecified: Secondary | ICD-10-CM | POA: Diagnosis not present

## 2018-11-14 DIAGNOSIS — M6281 Muscle weakness (generalized): Secondary | ICD-10-CM | POA: Diagnosis not present

## 2018-11-14 DIAGNOSIS — R262 Difficulty in walking, not elsewhere classified: Secondary | ICD-10-CM | POA: Diagnosis not present

## 2018-11-14 DIAGNOSIS — R296 Repeated falls: Secondary | ICD-10-CM | POA: Diagnosis not present

## 2018-11-15 DIAGNOSIS — M6281 Muscle weakness (generalized): Secondary | ICD-10-CM | POA: Diagnosis not present

## 2018-11-15 DIAGNOSIS — G459 Transient cerebral ischemic attack, unspecified: Secondary | ICD-10-CM | POA: Diagnosis not present

## 2018-11-15 DIAGNOSIS — R2681 Unsteadiness on feet: Secondary | ICD-10-CM | POA: Diagnosis not present

## 2018-11-15 DIAGNOSIS — R41841 Cognitive communication deficit: Secondary | ICD-10-CM | POA: Diagnosis not present

## 2018-11-15 DIAGNOSIS — R296 Repeated falls: Secondary | ICD-10-CM | POA: Diagnosis not present

## 2018-11-15 DIAGNOSIS — R262 Difficulty in walking, not elsewhere classified: Secondary | ICD-10-CM | POA: Diagnosis not present

## 2018-11-16 DIAGNOSIS — R2681 Unsteadiness on feet: Secondary | ICD-10-CM | POA: Diagnosis not present

## 2018-11-16 DIAGNOSIS — M6281 Muscle weakness (generalized): Secondary | ICD-10-CM | POA: Diagnosis not present

## 2018-11-16 DIAGNOSIS — R296 Repeated falls: Secondary | ICD-10-CM | POA: Diagnosis not present

## 2018-11-16 DIAGNOSIS — R262 Difficulty in walking, not elsewhere classified: Secondary | ICD-10-CM | POA: Diagnosis not present

## 2018-11-21 DIAGNOSIS — M6281 Muscle weakness (generalized): Secondary | ICD-10-CM | POA: Diagnosis not present

## 2018-11-21 DIAGNOSIS — R296 Repeated falls: Secondary | ICD-10-CM | POA: Diagnosis not present

## 2018-11-21 DIAGNOSIS — R262 Difficulty in walking, not elsewhere classified: Secondary | ICD-10-CM | POA: Diagnosis not present

## 2018-11-21 DIAGNOSIS — R2681 Unsteadiness on feet: Secondary | ICD-10-CM | POA: Diagnosis not present

## 2018-11-22 DIAGNOSIS — M6281 Muscle weakness (generalized): Secondary | ICD-10-CM | POA: Diagnosis not present

## 2018-11-22 DIAGNOSIS — R296 Repeated falls: Secondary | ICD-10-CM | POA: Diagnosis not present

## 2018-11-22 DIAGNOSIS — R2681 Unsteadiness on feet: Secondary | ICD-10-CM | POA: Diagnosis not present

## 2018-11-22 DIAGNOSIS — R262 Difficulty in walking, not elsewhere classified: Secondary | ICD-10-CM | POA: Diagnosis not present

## 2018-11-23 DIAGNOSIS — R262 Difficulty in walking, not elsewhere classified: Secondary | ICD-10-CM | POA: Diagnosis not present

## 2018-11-23 DIAGNOSIS — R2681 Unsteadiness on feet: Secondary | ICD-10-CM | POA: Diagnosis not present

## 2018-11-23 DIAGNOSIS — R296 Repeated falls: Secondary | ICD-10-CM | POA: Diagnosis not present

## 2018-11-23 DIAGNOSIS — M6281 Muscle weakness (generalized): Secondary | ICD-10-CM | POA: Diagnosis not present

## 2018-11-28 DIAGNOSIS — M6281 Muscle weakness (generalized): Secondary | ICD-10-CM | POA: Diagnosis not present

## 2018-11-28 DIAGNOSIS — R262 Difficulty in walking, not elsewhere classified: Secondary | ICD-10-CM | POA: Diagnosis not present

## 2018-11-28 DIAGNOSIS — R2681 Unsteadiness on feet: Secondary | ICD-10-CM | POA: Diagnosis not present

## 2018-11-28 DIAGNOSIS — R296 Repeated falls: Secondary | ICD-10-CM | POA: Diagnosis not present

## 2018-11-29 DIAGNOSIS — R262 Difficulty in walking, not elsewhere classified: Secondary | ICD-10-CM | POA: Diagnosis not present

## 2018-11-29 DIAGNOSIS — R296 Repeated falls: Secondary | ICD-10-CM | POA: Diagnosis not present

## 2018-11-29 DIAGNOSIS — R2681 Unsteadiness on feet: Secondary | ICD-10-CM | POA: Diagnosis not present

## 2018-11-29 DIAGNOSIS — M6281 Muscle weakness (generalized): Secondary | ICD-10-CM | POA: Diagnosis not present

## 2018-11-30 DIAGNOSIS — R2681 Unsteadiness on feet: Secondary | ICD-10-CM | POA: Diagnosis not present

## 2018-11-30 DIAGNOSIS — M6281 Muscle weakness (generalized): Secondary | ICD-10-CM | POA: Diagnosis not present

## 2018-11-30 DIAGNOSIS — R296 Repeated falls: Secondary | ICD-10-CM | POA: Diagnosis not present

## 2018-11-30 DIAGNOSIS — R262 Difficulty in walking, not elsewhere classified: Secondary | ICD-10-CM | POA: Diagnosis not present

## 2018-12-06 DIAGNOSIS — R262 Difficulty in walking, not elsewhere classified: Secondary | ICD-10-CM | POA: Diagnosis not present

## 2018-12-06 DIAGNOSIS — M6281 Muscle weakness (generalized): Secondary | ICD-10-CM | POA: Diagnosis not present

## 2018-12-06 DIAGNOSIS — R296 Repeated falls: Secondary | ICD-10-CM | POA: Diagnosis not present

## 2018-12-06 DIAGNOSIS — R2681 Unsteadiness on feet: Secondary | ICD-10-CM | POA: Diagnosis not present

## 2018-12-07 DIAGNOSIS — R2681 Unsteadiness on feet: Secondary | ICD-10-CM | POA: Diagnosis not present

## 2018-12-07 DIAGNOSIS — R296 Repeated falls: Secondary | ICD-10-CM | POA: Diagnosis not present

## 2018-12-07 DIAGNOSIS — R262 Difficulty in walking, not elsewhere classified: Secondary | ICD-10-CM | POA: Diagnosis not present

## 2018-12-07 DIAGNOSIS — M6281 Muscle weakness (generalized): Secondary | ICD-10-CM | POA: Diagnosis not present

## 2018-12-08 DIAGNOSIS — R2681 Unsteadiness on feet: Secondary | ICD-10-CM | POA: Diagnosis not present

## 2018-12-08 DIAGNOSIS — M6281 Muscle weakness (generalized): Secondary | ICD-10-CM | POA: Diagnosis not present

## 2018-12-08 DIAGNOSIS — R296 Repeated falls: Secondary | ICD-10-CM | POA: Diagnosis not present

## 2018-12-08 DIAGNOSIS — R262 Difficulty in walking, not elsewhere classified: Secondary | ICD-10-CM | POA: Diagnosis not present

## 2018-12-12 DIAGNOSIS — R296 Repeated falls: Secondary | ICD-10-CM | POA: Diagnosis not present

## 2018-12-12 DIAGNOSIS — R2681 Unsteadiness on feet: Secondary | ICD-10-CM | POA: Diagnosis not present

## 2018-12-12 DIAGNOSIS — R262 Difficulty in walking, not elsewhere classified: Secondary | ICD-10-CM | POA: Diagnosis not present

## 2018-12-12 DIAGNOSIS — M6281 Muscle weakness (generalized): Secondary | ICD-10-CM | POA: Diagnosis not present

## 2018-12-13 DIAGNOSIS — R262 Difficulty in walking, not elsewhere classified: Secondary | ICD-10-CM | POA: Diagnosis not present

## 2018-12-13 DIAGNOSIS — R296 Repeated falls: Secondary | ICD-10-CM | POA: Diagnosis not present

## 2018-12-13 DIAGNOSIS — M6281 Muscle weakness (generalized): Secondary | ICD-10-CM | POA: Diagnosis not present

## 2018-12-13 DIAGNOSIS — R2681 Unsteadiness on feet: Secondary | ICD-10-CM | POA: Diagnosis not present

## 2018-12-19 ENCOUNTER — Telehealth: Payer: Self-pay | Admitting: Cardiology

## 2018-12-19 NOTE — Telephone Encounter (Signed)
LVM to call and schedule 7 month followup with Dr. Herbie Baltimore.

## 2019-03-17 ENCOUNTER — Other Ambulatory Visit: Payer: Self-pay

## 2019-03-28 DIAGNOSIS — E039 Hypothyroidism, unspecified: Secondary | ICD-10-CM | POA: Diagnosis not present

## 2019-03-28 DIAGNOSIS — F22 Delusional disorders: Secondary | ICD-10-CM | POA: Diagnosis not present

## 2019-03-28 DIAGNOSIS — M810 Age-related osteoporosis without current pathological fracture: Secondary | ICD-10-CM | POA: Diagnosis not present

## 2019-03-28 DIAGNOSIS — R634 Abnormal weight loss: Secondary | ICD-10-CM | POA: Diagnosis not present

## 2019-04-02 DIAGNOSIS — N390 Urinary tract infection, site not specified: Secondary | ICD-10-CM | POA: Diagnosis not present

## 2019-04-19 DIAGNOSIS — R634 Abnormal weight loss: Secondary | ICD-10-CM | POA: Diagnosis not present

## 2019-04-19 DIAGNOSIS — R430 Anosmia: Secondary | ICD-10-CM | POA: Diagnosis not present

## 2019-04-19 DIAGNOSIS — F22 Delusional disorders: Secondary | ICD-10-CM | POA: Diagnosis not present

## 2019-04-21 ENCOUNTER — Telehealth: Payer: Self-pay | Admitting: Adult Health

## 2019-04-21 NOTE — Telephone Encounter (Signed)
Patient was sent consent via [place].She will have vitals ready. She does not have a smart phone or computer. This will be a phone visit.

## 2019-04-24 NOTE — Progress Notes (Signed)
Virtual Visit via Telephone Note   This visit type was conducted due to national recommendations for restrictions regarding the COVID-19 Pandemic (e.g. social distancing) in an effort to limit this patient's exposure and mitigate transmission in our community.  Due to her co-morbid illnesses, this patient is at least at moderate risk for complications without adequate follow up.  This format is felt to be most appropriate for this patient at this time.  The patient did not have access to video technology/had technical difficulties with video requiring transitioning to audio format only (telephone).  All issues noted in this document were discussed and addressed.  No physical exam could be performed with this format.  Please refer to the patient's chart for her  consent to telehealth for [facility]. Patient and her son have given verbal permission to conduct this visit via virtual appointment and to bill insurance 04/25/2019 10:45      Date:  04/25/2019   ID:  Haig Prophet, DOB 02/25/1926, MRN [id number]  Patient Location: [place] Provider Location: Home  PCP:  Deland Pretty, MD  Cardiologist:  Glenetta Hew, MD  Electrophysiologist:  None   Evaluation Performed:  Follow-Up Visit  Chief Complaint:    History of Present Illness:    Makayla Thomas is a 85 y.o. female chronic atrial fibrillation, history of mild to moderate aortic stenosis, dyslipidemia and hypertension.  The patient did have a fall back in 2019 fracturing her femur.  She is now living in a assisted living facility at [facility] in [place].   Was last seen by Dr. Ellyn Hack on 06/08/2018, at the time she was not eating well and had become frail.  She was continued on her current medications, there was still a concern about her falling again and she was advised on safety precautions, especially since she was on anticoagulation therapy with Xarelto.  The patient has vascular dementia which has become  progressive  She had been taken off Cartia and Triamterene/HCTZ previously as it was postulated that her weakness and fall may have been related to hypotension.  Dr. Ellyn Hack was comfortable with some mild permissive hypertension.  Consideration for repeating echocardiogram to assess her aortic valve was discussed but it was felt that she would unlikely want to undergo any repair.  I spoke with the patient and her son today on a three-way call.  The patient is extremely confused, is having trouble answering questions, and is uncertain about talking to all of [place] on the phone at the same time.  She denies any chest pain, shortness of breath.  There is a nurse with her who also is helping to answer the questions.  She does not see any excessive bruising, there is not been any bleeding noted.    Nurse does report that the patient becomes more confused throughout the day and has sometimes locked herself in her room or become more paranoid about those around her.  Her family is considering moving her into a "memory care" area of the facility for her safety.  They continue to discuss this.  I am deferring to them and their primary care for further assistance with this planning.   The patient does not have symptoms concerning for COVID-19 infection (fever, chills, cough, or new shortness of breath).    Past Medical History:  Diagnosis Date  . Anemia   . Aortic stenosis, moderate (was mild) 08/2009; 08/2014   a. ECHO  EF 55-60%, wth increased EDP, mild AS;; b. mild Conc LVH,  EF 60-65%, DD with high LVEDP, MIld-Mod AS, Mod MAC with Mod MR, mild-mod PA HTN.  [name] Complication of anesthesia    hallusinations  . Constipation   . Diabetes mellitus without complication ([place])    possibly  . Dyslipidemia    treated  . H/O cardiovascular stress test 05/2010   Lexiscan cardiolite no ischemia or infarction  . H/O multiple sclerosis    no excaerbations in some time  . History of ETT 02/2011   [place] exercise  protocol, negative with poor exercise tolerance  . History of transfusion   . Hypertension   . Hypothyroidism   . Lower extremity edema    chronic  . Osteoarthritis    "generalized" (04/13/2018)  . PAD (peripheral artery disease) ([place]) 08/30/2009   Dopplers; with bil. post. tib artery occlusion  . Permanent atrial fibrillation    on Xarelto  . Stroke [facility]) ~2011/2012   "memory issues since" (04/13/2018)  . Transient ischemic attack (TIA) "she's had several"   "memory issues since" (04/13/2018)  . Vaginal itching   . Visual disturbances    "FLASHING IN MY EYES"   Past Surgical History:  Procedure Laterality Date  . BREAST SURGERY     L tumor removed - benign  . CATARACT EXTRACTION, BILATERAL Bilateral   . CESAREAN SECTION  1952; 1956; 1959  . CHOLECYSTECTOMY    . JOINT REPLACEMENT    . ORIF ANKLE FRACTURE Left 04/14/2018   Procedure: OPEN REDUCTION INTERNAL FIXATION (ORIF) ANKLE FRACTURE;  Surgeon: Shona Needles, MD;  Location: [facility];  Service: Orthopedics;  Laterality: Left;  . ORIF FEMUR FRACTURE Left 04/14/2018   Procedure: OPEN REDUCTION INTERNAL FIXATION (ORIF) DISTAL FEMUR FRACTURE;  Surgeon: Shona Needles, MD;  Location: [facility];  Service: Orthopedics;  Laterality: Left;  . ORIF PERIPROSTHETIC FRACTURE Right 02/07/2015   Procedure: OPEN REDUCTION INTERNAL FIXATION (ORIF) PERIPROSTHETIC FRACTURE WITH TOTAL HIP REVISION AND CABLES;  Surgeon: Paralee Cancel, MD;  Location: [facility];  Service: Orthopedics;  Laterality: Right;  . THYROIDECTOMY, PARTIAL Left   . TOTAL HIP ARTHROPLASTY Right 01/29/2015   Procedure: RIGHT TOTAL HIP ARTHROPLASTY ANTERIOR APPROACH;  Surgeon: Paralee Cancel, MD;  Location: [facility];  Service: Orthopedics;  Laterality: Right;  . TOTAL KNEE ARTHROPLASTY Left 2012     No outpatient medications have been marked as taking for the 04/25/19 encounter (Telemedicine) with Lendon Colonel, NP.     Allergies:   Tetanus antitoxin, Tramadol, Augmentin [amoxicillin-pot  clavulanate], Codeine, Livalo [pitavastatin], and Meclizine   Social History   Tobacco Use  . Smoking status: Never Smoker  . Smokeless tobacco: Never Used  Substance Use Topics  . Alcohol use: No    Alcohol/week: 0.0 standard drinks  . Drug use: No    Types: Fentanyl     Family Hx: The patient's family history includes Heart failure (age of onset: 69) in her father; Lung cancer in her father.  ROS:   Please see the history of present illness.    All other systems reviewed and are negative.   Prior CV studies:   The following studies were reviewed today: Echocardiogram 07/04/2018  Left ventricle: The cavity size was normal. Wall thickness was   increased in a pattern of moderate LVH. Systolic function was   normal. The estimated ejection fraction was in the range of 60%   to 65%. The study is not technically sufficient to allow   evaluation of LV diastolic function. - Aortic valve: Mildly calcified leaflets. Moderate stenosis.  There   was trivial regurgitation. Mean gradient (S): 18 mm Hg. Peak   gradient (S): 36 mm Hg. Valve area (VTI): 0.96 cm^2. Valve area   (Vmax): 1.05 cm^2. Valve area (Vmean): 1.03 cm^2. - Mitral valve: Calcified annulus. Mildly thickened leaflets .   There was mild regurgitation. - Left atrium: Severely dilated. - Right ventricle: The cavity size was mildly dilated. TAPSE: 11.7   mm . - Right atrium: Moderately dilated. - Tricuspid valve: There was moderate regurgitation. - Pulmonary arteries: PA peak pressure: 56 mm Hg (S). - Inferior vena cava: The vessel was normal in size. The   respirophasic diameter changes were in the normal range (>= 50%),   consistent with normal central venous pressure.  Labs/Other Tests and Data Reviewed:    EKG:  No ECG reviewed.  Recent Labs: 07/03/2018: ALT 13; BUN 15; Creatinine, Ser 0.70; Hemoglobin 12.9; Platelets 252; Potassium 3.5; Sodium 141   Recent Lipid Panel Lab Results  Component Value Date/Time    CHOL 176 07/04/2018 06:53 AM   TRIG 112 07/04/2018 06:53 AM   HDL 44 07/04/2018 06:53 AM   CHOLHDL 4.0 07/04/2018 06:53 AM   LDLCALC 110 (H) 07/04/2018 06:53 AM    Wt Readings from Last 3 Encounters:  07/03/18 154 lb 5.2 oz (70 kg)  06/08/18 153 lb (69.4 kg)  04/13/18 165 lb 5.5 oz (75 kg)     Objective:    Vital Signs:  There were no vitals taken for this visit.   GEN:  no acute distress RESPIRATORY:  normal respiratory effort, symmetric expansion PSYCH:  Confused, difficulty answering questions and understanding   ASSESSMENT & PLAN:    1. Moderate aortic valve stenosis: Unable to assess via telephone.  Due to her age and multiple core morbidities no further cardiac testing is planned.  2.  Hypertension: Currently only on bisoprolol 10 mg daily.  She is complaining of weakness and fatigue.  Uncertain if this is related to medication, aging, or progressive dementia.  3.  Hypercholesterolemia: I am going to discontinue atorvastatin.  Due to continuing confusion, this may be helpful although not a significant fix.  4.  Atrial fibrillation: She continues on Xarelto.  I have discussed with the nurse who is with her about safety issues concerning falling, and advanced confusion.  At this time she has not had any further falls there is no bleeding issues or excessive bruising.  We will continue Xarelto for now with CHADS VASC Score of 8.  Checking labs via [facility], TSH, CBC. BMET.   5. Hypothyroidism: Checking TSH with labs. Defer to PCP for medication management.   6.Vascular Dementia:  Concerns for her safety. She is being well cared for at [facility].  No further falls.  Family discussing need to move into more advanced care.  Defer to their wishes.     COVID-19 Education: The signs and symptoms of COVID-19 were discussed with the patient and how to seek care for testing (follow up with PCP or arrange E-visit).  The importance of social distancing was discussed today.  Mrs. Mally does not leave the facility.   Time:   Today, I have spent 25  minutes with the patient, her son, and nurse with telehealth technology discussing the above problems.     Medication Adjustments/Labs and Tests Ordered: Current medicines are reviewed at length with the patient today.  Concerns regarding medicines are outlined above.   Tests Ordered: No orders of the defined types were placed in this  encounter.   Medication Changes: No orders of the defined types were placed in this encounter.   Disposition:  Follow up 6 months, virtually.  Signed, Phill Myron. West Pugh, ANP, AACC  04/25/2019 11:22 AM    [facility]

## 2019-04-25 ENCOUNTER — Telehealth (INDEPENDENT_AMBULATORY_CARE_PROVIDER_SITE_OTHER): Payer: Medicare Other | Admitting: Adult Health

## 2019-04-25 ENCOUNTER — Telehealth: Payer: Self-pay | Admitting: *Deleted

## 2019-04-25 DIAGNOSIS — I4819 Other persistent atrial fibrillation: Secondary | ICD-10-CM

## 2019-04-25 DIAGNOSIS — F01518 Vascular dementia, unspecified severity, with other behavioral disturbance: Secondary | ICD-10-CM

## 2019-04-25 DIAGNOSIS — E079 Disorder of thyroid, unspecified: Secondary | ICD-10-CM

## 2019-04-25 DIAGNOSIS — I350 Nonrheumatic aortic (valve) stenosis: Secondary | ICD-10-CM

## 2019-04-25 DIAGNOSIS — F0151 Vascular dementia with behavioral disturbance: Secondary | ICD-10-CM

## 2019-04-25 DIAGNOSIS — I6340 Cerebral infarction due to embolism of unspecified cerebral artery: Secondary | ICD-10-CM

## 2019-04-25 DIAGNOSIS — I10 Essential (primary) hypertension: Secondary | ICD-10-CM | POA: Diagnosis not present

## 2019-04-25 DIAGNOSIS — Z79899 Other long term (current) drug therapy: Secondary | ICD-10-CM

## 2019-04-25 NOTE — Telephone Encounter (Signed)
Makayla Thomas  Date of Thomas ([place]):  April 25, 2019  Requesting Provider:  Jory Sims, DNP    Agency Requested:    [facility] Contact:  Makayla Buff, NP [address] [place], [place] [address] Phone #:  [phone] Fax #:  [phone]  Patient Demographic Information: Name:  Makayla Thomas Age:  85 y.o.   DOB:  1926-01-04  MRN:  [id number]   Address:   [address] [place] [address]   Phone Numbers:   Home Phone [phone]  Work Phone [phone]  Mobile [phone]     Emergency Contact Information on File:   Contact Information    Name Relation Home Work [place] Son [phone]  [phone]   [place] Relative [phone]  [phone]   Makayla Thomas   [phone]      The above family members may be contacted for information on this patient (review DPR on file):  Her son Makayla Thomas   Patient Clinical Information:  Primary Care Provider:  Deland Pretty, MD  Primary Cardiologist:  Makayla Hew, MD  Primary Electrophysiologist:  None   Past Medical Hx: Makayla Thomas  has a past medical history of Anemia, Aortic stenosis, moderate (was mild) (12/275; 11/1285), Complication of anesthesia, Constipation, Diabetes mellitus without complication ([place]), Dyslipidemia, H/O cardiovascular stress test (05/2010), H/O multiple sclerosis, History of ETT (02/2011), History of transfusion, Hypertension, Hypothyroidism, Lower extremity edema, Osteoarthritis, PAD (peripheral artery disease) ([place]) (08/30/2009), Permanent atrial fibrillation, Stroke ([place]) (~2011/2012), Transient ischemic attack (TIA) ("she's had several"), Vaginal itching, and Visual disturbances.   Allergies: She is allergic to tetanus antitoxin; tramadol; augmentin [amoxicillin-pot clavulanate]; codeine; livalo [pitavastatin]; and meclizine.   Medications: Current Outpatient Medications on  File Prior to Visit  Medication Sig  . acetaminophen (TYLENOL) 325 MG tablet Take 2 tablets (650 mg total) by mouth every 6 (six) hours as needed for mild pain or moderate pain.  [name] apixaban (ELIQUIS) 5 MG TABS tablet Take 5 mg by mouth 2 (two) times daily.  [name] atorvastatin (LIPITOR) 10 MG tablet Take 10 mg by mouth daily.  . bisoprolol (ZEBETA) 10 MG tablet Take 1 tablet (10 mg total) by mouth daily.  . cholecalciferol (VITAMIN D) 1000 UNITS tablet Take 1,000 Units by mouth every morning.   . docusate sodium (COLACE) 100 MG capsule Take 1 capsule (100 mg total) by mouth 2 (two) times daily.  . feeding supplement, ENSURE ENLIVE, (ENSURE ENLIVE) LIQD Take 237 mLs by mouth 2 (two) times daily between meals.  [name] levothyroxine (SYNTHROID, LEVOTHROID) 125 MCG tablet Take 125 mcg by mouth daily before breakfast.  . senna (SENOKOT) 8.6 MG tablet Take 1 tablet by mouth daily.   No current facility-administered medications on file prior to visit.      Social Hx: She  reports that she has never smoked. She has never used smokeless tobacco. She reports that she does not drink alcohol or use drugs.    Diagnosis/Reason for Visit:   CAD and thyroid disease/ weakness and weight loss  Services Requested:  Labs:  BMP CBC, TSH  # of Visits Needed/Frequency per Week: 1  Please call [phone] and asked for the Med Tech for [facility]

## 2019-04-25 NOTE — Patient Instructions (Signed)
Medication Instructions:  STOP- Atorvastatin  If you need a refill on your cardiac medications before your next appointment, please call your pharmacy.  Labwork: CBC, TSH and BMP  HERE IN OUR OFFICE AT [facility]  You will NOT need to fast   Take the provided lab slips with you to the lab for your blood draw.   When you have your labs (blood work) drawn today and your tests are completely normal, you will receive your results only by MyChart Message (if you have MyChart) -OR-  A paper copy in the mail.  If you have any lab test that is abnormal or we need to change your treatment, we will call you to review these results.  Testing/Procedures: None Ordered  Follow-Up: You will need a follow up appointment in 6 months.   Jory Sims, DNP, ANP     At [facility], you and your health needs are our priority.  As part of our continuing mission to provide you with exceptional heart care, we have created designated Provider Care Teams.  These Care Teams include your primary Cardiologist (physician) and Advanced Practice Providers (APPs -  Physician Assistants and Nurse Practitioners) who all work together to provide you with the care you need, when you need it.  Thank you for choosing [facility] at [facility]!!

## 2019-04-25 NOTE — Addendum Note (Signed)
Addended by: Vennie Homans on: 04/25/2019 11:34 AM   Modules accepted: Orders

## 2019-04-26 DIAGNOSIS — E079 Disorder of thyroid, unspecified: Secondary | ICD-10-CM | POA: Diagnosis not present

## 2019-04-26 DIAGNOSIS — I4819 Other persistent atrial fibrillation: Secondary | ICD-10-CM | POA: Diagnosis not present

## 2019-04-26 DIAGNOSIS — Z79899 Other long term (current) drug therapy: Secondary | ICD-10-CM | POA: Diagnosis not present

## 2019-04-26 DIAGNOSIS — I10 Essential (primary) hypertension: Secondary | ICD-10-CM | POA: Diagnosis not present

## 2019-04-27 LAB — CBC
Hematocrit: 38.8 % (ref 34.0–46.6)
Hemoglobin: 12.6 g/dL (ref 11.1–15.9)
MCH: 29.9 pg (ref 26.6–33.0)
MCHC: 32.5 g/dL (ref 31.5–35.7)
MCV: 92 fL (ref 79–97)
Platelets: 213 10*3/uL (ref 150–450)
RBC: 4.21 x10E6/uL (ref 3.77–5.28)
RDW: 13.4 % (ref 11.7–15.4)
WBC: 6.6 10*3/uL (ref 3.4–10.8)

## 2019-04-27 LAB — BASIC METABOLIC PANEL
BUN/Creatinine Ratio: 15 (ref 12–28)
BUN: 17 mg/dL (ref 10–36)
CO2: 26 mmol/L (ref 20–29)
Calcium: 9 mg/dL (ref 8.7–10.3)
Chloride: 103 mmol/L (ref 96–106)
Creatinine, Ser: 1.16 mg/dL — ABNORMAL HIGH (ref 0.57–1.00)
GFR calc Af Amer: 48 mL/min/{1.73_m2} — ABNORMAL LOW (ref >59)
GFR calc non Af Amer: 41 mL/min/{1.73_m2} — ABNORMAL LOW (ref >59)
Glucose: 125 mg/dL — ABNORMAL HIGH (ref 65–99)
Potassium: 3.9 mmol/L (ref 3.5–5.2)
Sodium: 142 mmol/L (ref 134–144)

## 2019-04-27 LAB — TSH: TSH: 8.95 u[IU]/mL — ABNORMAL HIGH (ref 0.450–4.500)

## 2019-07-20 DIAGNOSIS — Z03818 Encounter for observation for suspected exposure to other biological agents ruled out: Secondary | ICD-10-CM | POA: Diagnosis not present

## 2019-07-25 DIAGNOSIS — Z03818 Encounter for observation for suspected exposure to other biological agents ruled out: Secondary | ICD-10-CM | POA: Diagnosis not present

## 2019-08-14 DIAGNOSIS — Z20828 Contact with and (suspected) exposure to other viral communicable diseases: Secondary | ICD-10-CM | POA: Diagnosis not present

## 2019-08-15 IMAGING — DX DG CHEST 1V PORT
1 series · 1 of 1 positions shown · non-contrast
Comparison: 02/08/2015

CLINICAL DATA: Pain after fall.

EXAM:
PORTABLE CHEST 1 VIEW

[chest ap]
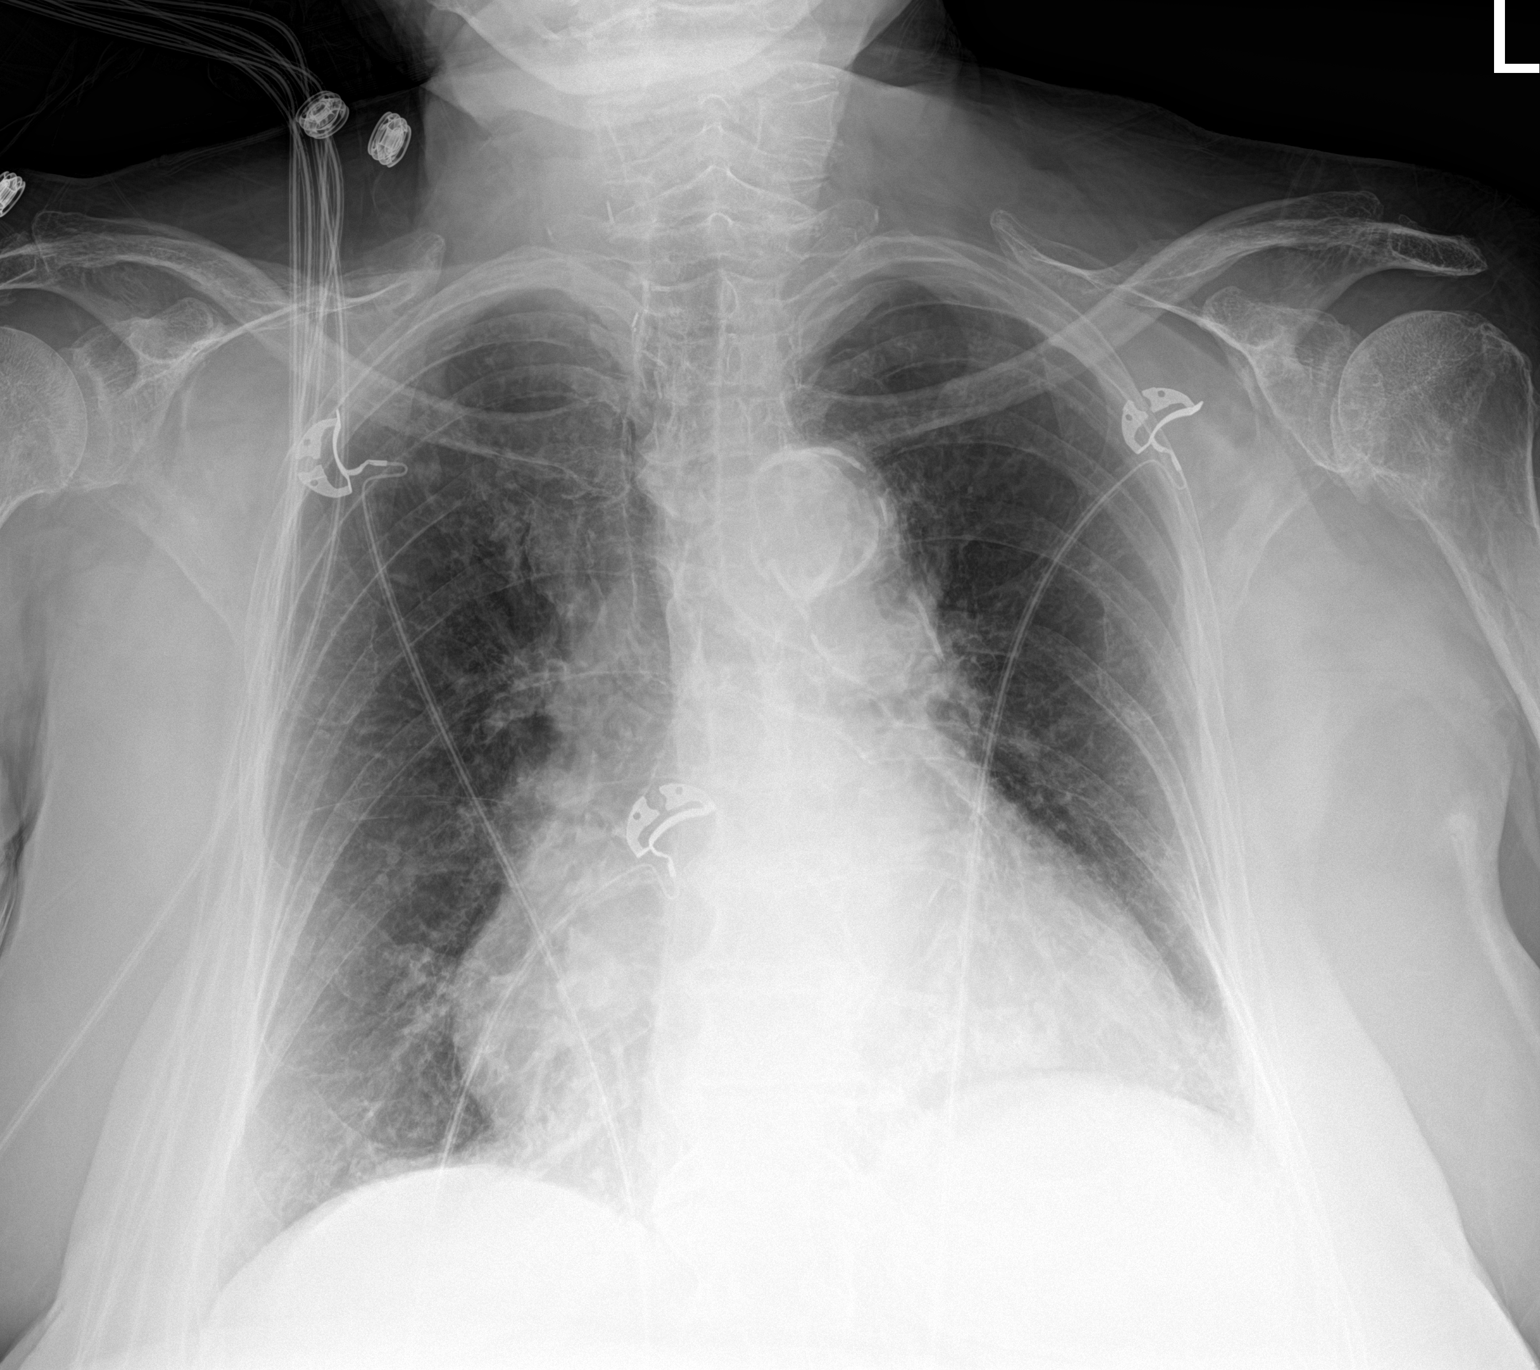

[1 of 1 positions shown; findings below may reference images not displayed]

FINDINGS: Lungs are adequately inflated without focal consolidation or
effusion. Mild-to-moderate stable cardiomegaly. Calcified plaque is
present over the thoracic aorta. There is diffuse osteopenia. There
are degenerative changes of the spine and shoulders. No definite
acute fracture. Surgical clips over the neck base.
IMPRESSION: No acute findings.

Mild to moderate cardiomegaly.

## 2019-08-15 IMAGING — CT CT HEAD W/O CM
4 of 8 series · 15 of 47 positions shown, 17 images · non-contrast
Comparison: 08/31/2014 head CT. 11/17/2009 head and cervical spine
CT.

CLINICAL DATA: Fall backwards today with head injury. Back pain.
Headache.

EXAM:
CT HEAD WITHOUT CONTRAST
CT CERVICAL SPINE WITHOUT CONTRAST
TECHNIQUE: Multidetector CT imaging of the head and cervical spine was
performed following the standard protocol without intravenous
contrast. Multiplanar CT image reconstructions of the cervical spine
were also generated.

[Series 5: head bone · axial · 0.42mm/px · z∈[-110,-64]mm · 3 of 81 slices shown]
[im 12/81  bone]
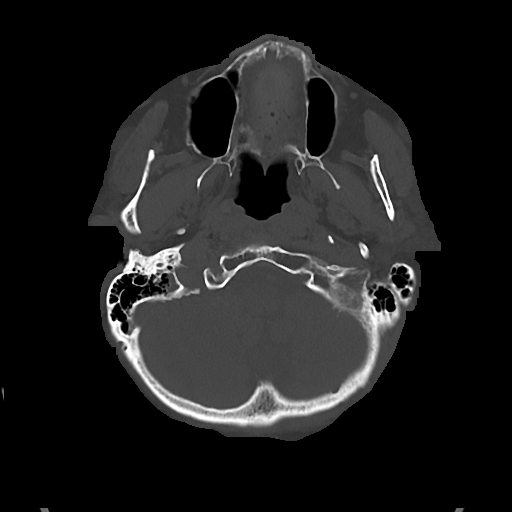
[im 23/81  bone]
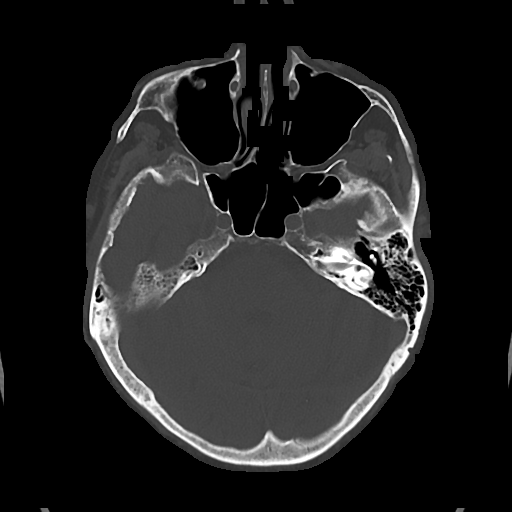
[im 35/81  bone]
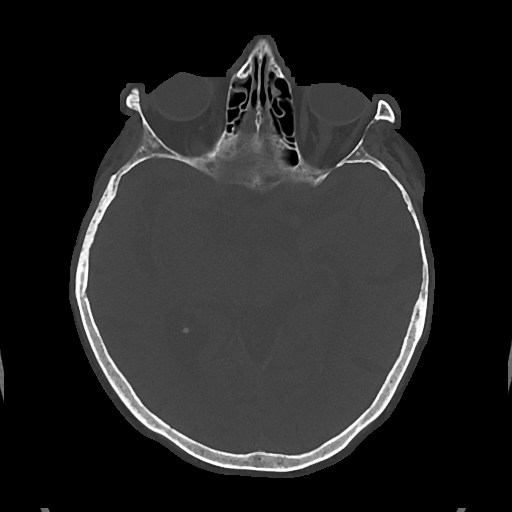

[Series 6: cor soft · coronal · 0.32mm/px · 3 of 61 slices shown]
[im 18/61  brain]
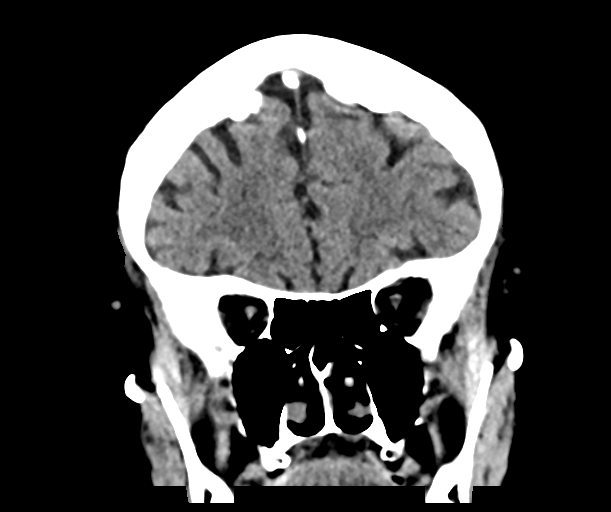
[im 26/61  brain]
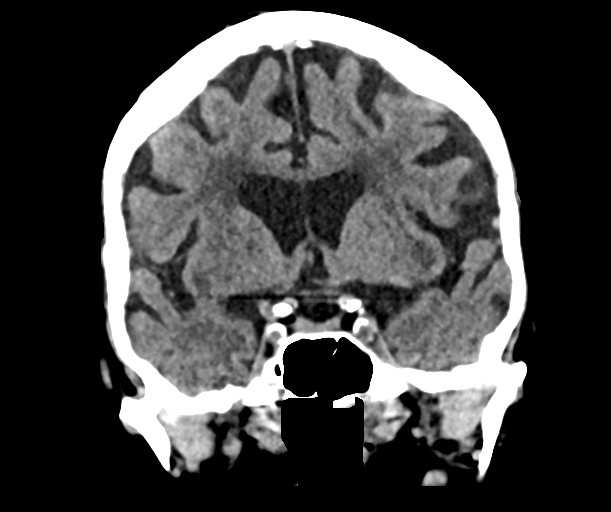
[im 35/61  brain]
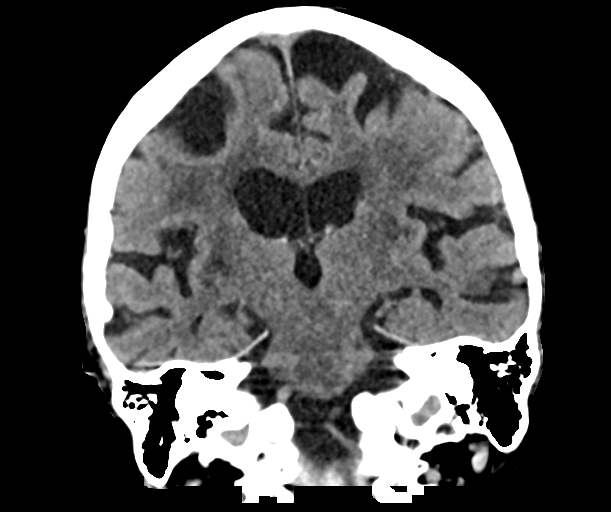

[Series 7: sag soft · sagittal · 0.33mm/px · 2 of 52 slices shown]
[im 18/52  brain]
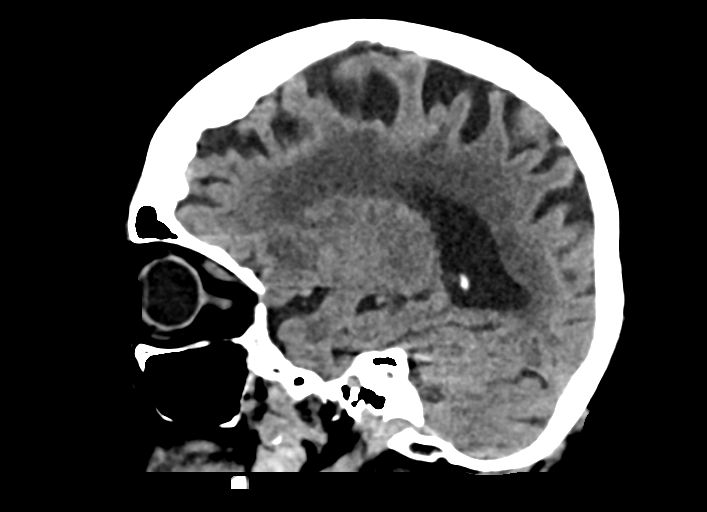
[im 35/52  brain]
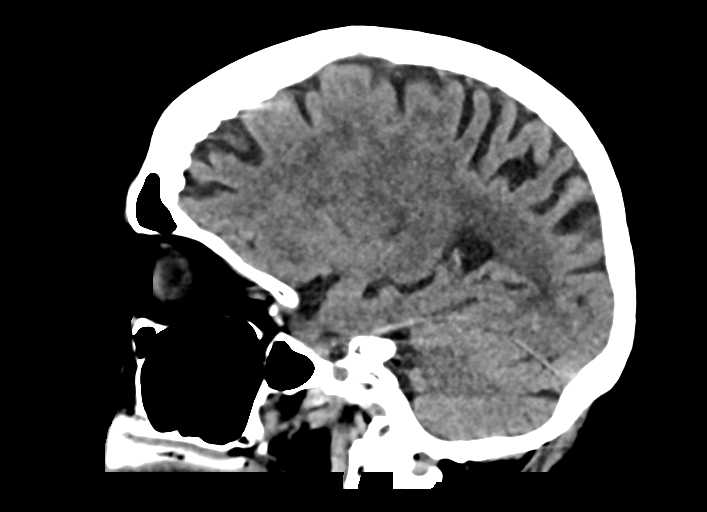

[Series 12: orthogonal axials · axial · 0.21mm/px · z∈[-269,-140]mm · 7 of 94 slices shown, 9 images]
[im 12/94  brain]
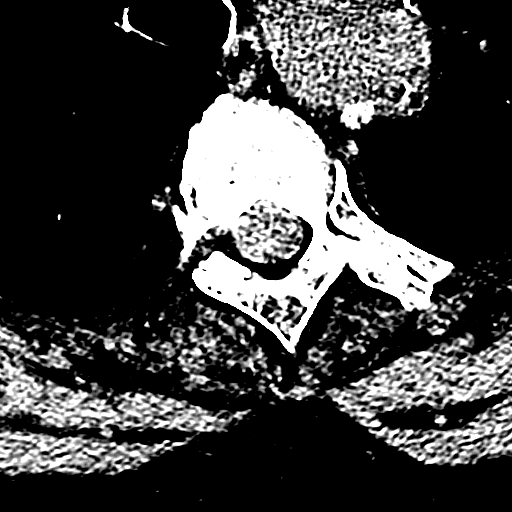
[im 12/94  bone]
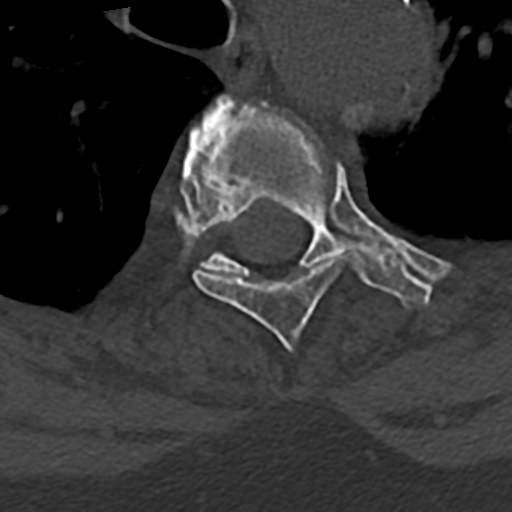
[im 24/94  brain]
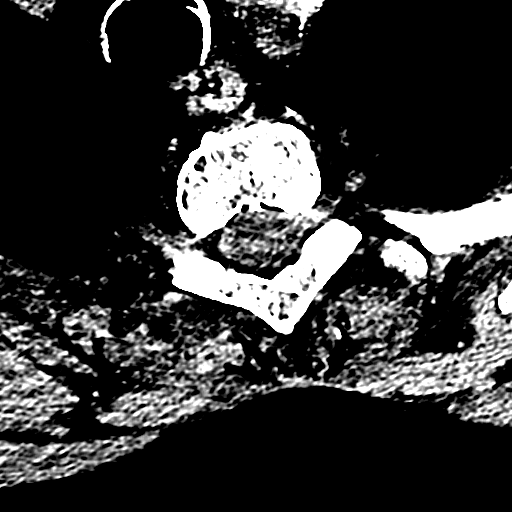
[im 35/94  brain]
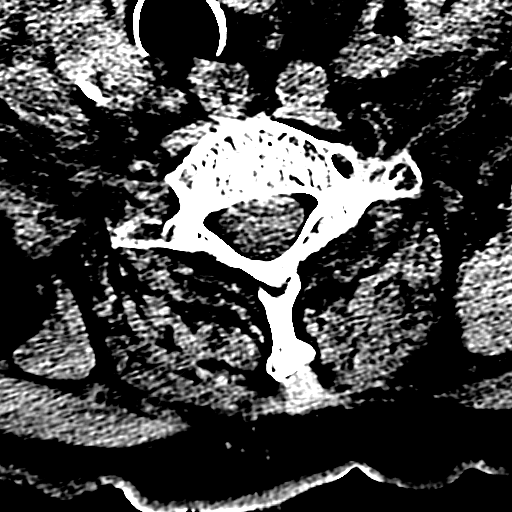
[im 47/94  brain]
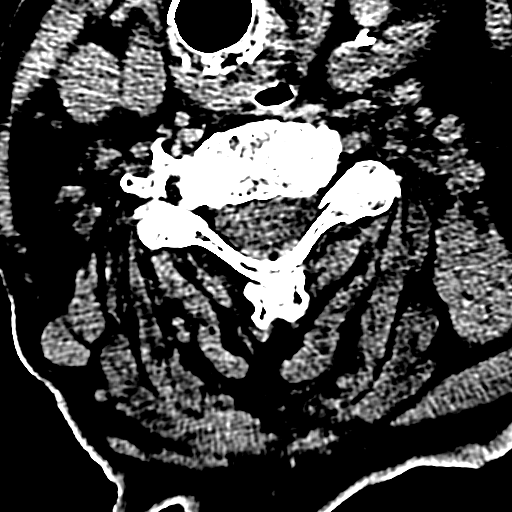
[im 59/94  brain]
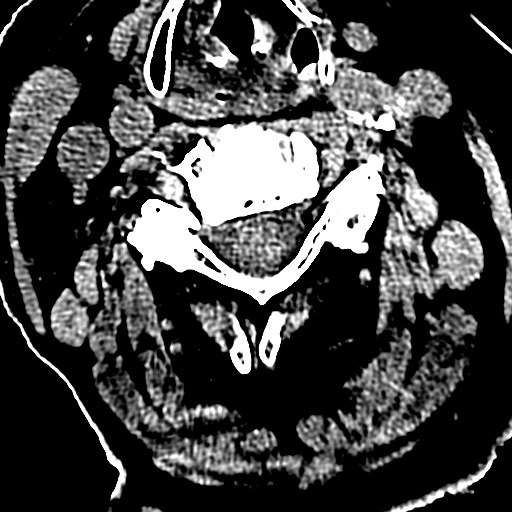
[im 59/94  bone]
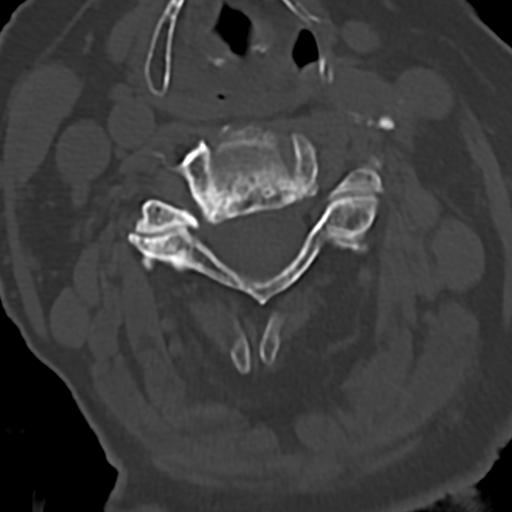
[im 70/94  brain]
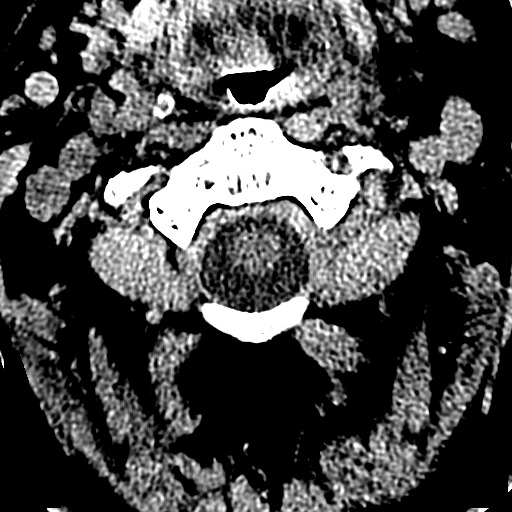
[im 82/94  brain]
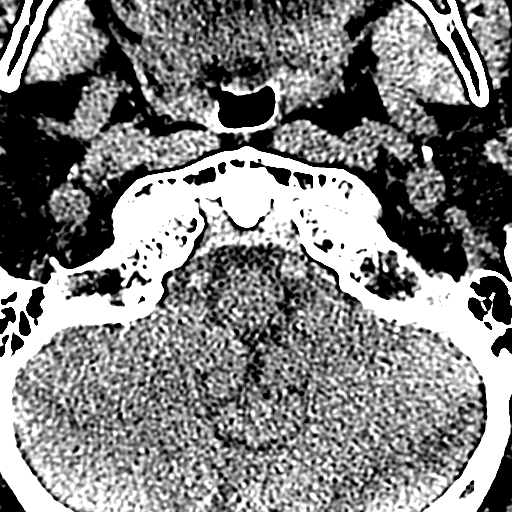

[15 of 47 positions shown; findings below may reference images not displayed]

FINDINGS: CT HEAD FINDINGS

Brain: No evidence of parenchymal hemorrhage or extra-axial fluid
collection. No mass lesion, mass effect, or midline shift. No CT
evidence of acute infarction. Generalized cerebral volume loss.
Nonspecific severe subcortical and periventricular white matter
hypodensity, most in keeping with chronic small vessel ischemic
change. No ventriculomegaly.

Vascular: No acute abnormality.

Skull: No evidence of calvarial fracture.

Sinuses/Orbits: The visualized paranasal sinuses are essentially
clear.

Other:  The mastoid air cells are unopacified.

CT CERVICAL SPINE FINDINGS

Alignment: Normal cervical lordosis. No acute subluxation. Minimal 2
mm retrolisthesis at C4-5 is stable since 11/17/2009 CT. Dens is
well positioned between the lateral masses of C1.

Skull base and vertebrae: No acute fracture. No primary bone lesion
or focal pathologic process.

Soft tissues and spinal canal: No prevertebral fluid or swelling. No
visible canal hematoma.

Disc levels: Moderate to severe multilevel degenerative disc disease
throughout the cervical spine, not appreciably changed. Moderate
bilateral facet arthropathy. Mild bilateral degenerative foraminal
stenosis at C3-4 and C5-6.

Upper chest: No acute abnormality.  Aortic atherosclerosis.

Other: Visualized mastoid air cells appear clear. No discrete
thyroid nodules. No pathologically enlarged cervical nodes.
IMPRESSION: 1. No evidence of acute intracranial abnormality. No evidence of
calvarial fracture.
2. Generalized cerebral volume loss and marked chronic small vessel
ischemic change in the cerebral white matter.
3. No cervical spine fracture or acute malalignment.
4. Moderate to severe degenerative changes in the cervical spine as
detailed.

## 2019-08-22 DIAGNOSIS — Z20828 Contact with and (suspected) exposure to other viral communicable diseases: Secondary | ICD-10-CM | POA: Diagnosis not present

## 2019-08-29 DIAGNOSIS — Z20828 Contact with and (suspected) exposure to other viral communicable diseases: Secondary | ICD-10-CM | POA: Diagnosis not present

## 2019-09-05 DIAGNOSIS — Z20828 Contact with and (suspected) exposure to other viral communicable diseases: Secondary | ICD-10-CM | POA: Diagnosis not present

## 2019-09-12 DIAGNOSIS — Z20828 Contact with and (suspected) exposure to other viral communicable diseases: Secondary | ICD-10-CM | POA: Diagnosis not present

## 2019-09-19 DIAGNOSIS — Z20828 Contact with and (suspected) exposure to other viral communicable diseases: Secondary | ICD-10-CM | POA: Diagnosis not present

## 2019-09-27 DIAGNOSIS — N390 Urinary tract infection, site not specified: Secondary | ICD-10-CM | POA: Diagnosis not present

## 2019-10-03 ENCOUNTER — Telehealth: Payer: Self-pay | Admitting: *Deleted

## 2019-10-03 NOTE — Telephone Encounter (Signed)
A message was left, re: her follow up visit. 

## 2019-10-20 ENCOUNTER — Emergency Department (HOSPITAL_COMMUNITY): Payer: Medicare Other

## 2019-10-20 ENCOUNTER — Emergency Department (HOSPITAL_COMMUNITY)
Admission: EM | Admit: 2019-10-20 | Discharge: 2019-10-20 | Disposition: A | Discharge status: Home / Self Care | Payer: Medicare Other | Attending: Emergency Medicine | Admitting: Emergency Medicine

## 2019-10-20 ENCOUNTER — Encounter (HOSPITAL_COMMUNITY): Payer: Self-pay

## 2019-10-20 ENCOUNTER — Other Ambulatory Visit: Payer: Self-pay

## 2019-10-20 DIAGNOSIS — M25521 Pain in right elbow: Secondary | ICD-10-CM | POA: Diagnosis not present

## 2019-10-20 DIAGNOSIS — N183 Chronic kidney disease, stage 3 unspecified: Secondary | ICD-10-CM | POA: Insufficient documentation

## 2019-10-20 DIAGNOSIS — S0993XA Unspecified injury of face, initial encounter: Secondary | ICD-10-CM | POA: Diagnosis not present

## 2019-10-20 DIAGNOSIS — S4991XA Unspecified injury of right shoulder and upper arm, initial encounter: Secondary | ICD-10-CM | POA: Diagnosis not present

## 2019-10-20 DIAGNOSIS — I129 Hypertensive chronic kidney disease with stage 1 through stage 4 chronic kidney disease, or unspecified chronic kidney disease: Secondary | ICD-10-CM | POA: Insufficient documentation

## 2019-10-20 DIAGNOSIS — T1490XA Injury, unspecified, initial encounter: Secondary | ICD-10-CM

## 2019-10-20 DIAGNOSIS — F0390 Unspecified dementia without behavioral disturbance: Secondary | ICD-10-CM | POA: Insufficient documentation

## 2019-10-20 DIAGNOSIS — S0081XA Abrasion of other part of head, initial encounter: Secondary | ICD-10-CM | POA: Diagnosis not present

## 2019-10-20 DIAGNOSIS — Z743 Need for continuous supervision: Secondary | ICD-10-CM | POA: Diagnosis not present

## 2019-10-20 DIAGNOSIS — I4891 Unspecified atrial fibrillation: Secondary | ICD-10-CM | POA: Diagnosis not present

## 2019-10-20 DIAGNOSIS — S0990XA Unspecified injury of head, initial encounter: Secondary | ICD-10-CM | POA: Diagnosis not present

## 2019-10-20 DIAGNOSIS — W19XXXA Unspecified fall, initial encounter: Secondary | ICD-10-CM | POA: Diagnosis not present

## 2019-10-20 DIAGNOSIS — Y92122 Bedroom in nursing home as the place of occurrence of the external cause: Secondary | ICD-10-CM | POA: Diagnosis not present

## 2019-10-20 DIAGNOSIS — S299XXA Unspecified injury of thorax, initial encounter: Secondary | ICD-10-CM | POA: Diagnosis not present

## 2019-10-20 DIAGNOSIS — M25511 Pain in right shoulder: Secondary | ICD-10-CM | POA: Diagnosis not present

## 2019-10-20 DIAGNOSIS — S59901A Unspecified injury of right elbow, initial encounter: Secondary | ICD-10-CM | POA: Diagnosis not present

## 2019-10-20 DIAGNOSIS — Y999 Unspecified external cause status: Secondary | ICD-10-CM | POA: Insufficient documentation

## 2019-10-20 DIAGNOSIS — W06XXXA Fall from bed, initial encounter: Secondary | ICD-10-CM | POA: Insufficient documentation

## 2019-10-20 DIAGNOSIS — Z7401 Bed confinement status: Secondary | ICD-10-CM | POA: Diagnosis not present

## 2019-10-20 DIAGNOSIS — Z043 Encounter for examination and observation following other accident: Secondary | ICD-10-CM | POA: Diagnosis present

## 2019-10-20 DIAGNOSIS — M2550 Pain in unspecified joint: Secondary | ICD-10-CM | POA: Diagnosis not present

## 2019-10-20 DIAGNOSIS — M25519 Pain in unspecified shoulder: Secondary | ICD-10-CM | POA: Diagnosis not present

## 2019-10-20 DIAGNOSIS — S199XXA Unspecified injury of neck, initial encounter: Secondary | ICD-10-CM | POA: Diagnosis not present

## 2019-10-20 DIAGNOSIS — Z79899 Other long term (current) drug therapy: Secondary | ICD-10-CM | POA: Insufficient documentation

## 2019-10-20 DIAGNOSIS — Z7901 Long term (current) use of anticoagulants: Secondary | ICD-10-CM | POA: Diagnosis not present

## 2019-10-20 DIAGNOSIS — R5381 Other malaise: Secondary | ICD-10-CM | POA: Diagnosis not present

## 2019-10-20 DIAGNOSIS — R404 Transient alteration of awareness: Secondary | ICD-10-CM | POA: Diagnosis not present

## 2019-10-20 DIAGNOSIS — S0031XA Abrasion of nose, initial encounter: Secondary | ICD-10-CM | POA: Insufficient documentation

## 2019-10-20 DIAGNOSIS — Y939 Activity, unspecified: Secondary | ICD-10-CM | POA: Insufficient documentation

## 2019-10-20 DIAGNOSIS — R220 Localized swelling, mass and lump, head: Secondary | ICD-10-CM | POA: Diagnosis not present

## 2019-10-20 DIAGNOSIS — S3993XA Unspecified injury of pelvis, initial encounter: Secondary | ICD-10-CM | POA: Diagnosis not present

## 2019-10-20 LAB — I-STAT CHEM 8, ED
BUN: 30 mg/dL — ABNORMAL HIGH (ref 8–23)
Calcium, Ion: 1.16 mmol/L (ref 1.15–1.40)
Chloride: 104 mmol/L (ref 98–111)
Creatinine, Ser: 1.1 mg/dL — ABNORMAL HIGH (ref 0.44–1.00)
Glucose, Bld: 103 mg/dL — ABNORMAL HIGH (ref 70–99)
HCT: 38 % (ref 36.0–46.0)
Hemoglobin: 12.9 g/dL (ref 12.0–15.0)
Potassium: 4.1 mmol/L (ref 3.5–5.1)
Sodium: 143 mmol/L (ref 135–145)
TCO2: 30 mmol/L (ref 22–32)

## 2019-10-20 NOTE — ED Notes (Signed)
Pt resting comfortable. Pt son with pt. Discharge instructions reviewed with pt. Pt verbalized understanding. Report given to nurse at [facility].

## 2019-10-20 NOTE — ED Triage Notes (Signed)
Pt arrives via EMS from [facility] facility where she fell out of bed and hit her head and right shoulder on [facility] unit. Pt is alert to baseline per facility. Pt complaining of right shoulder pain and has laceration to forehead and bridge of nose.

## 2019-10-20 NOTE — ED Provider Notes (Signed)
[facility] EMERGENCY DEPARTMENT Provider Note   CSN: [id number] Arrival date & time: 10/20/19  2001     History Chief Complaint  Patient presents with  . Level 2 Trauma Fall    Taylinn Brabant is a 86 y.o. female.  The history is provided by the patient, the EMS personnel and a relative. The history is limited by the condition of the patient.  The patient is a 86 year old female who presented as a level 2 trauma for a fall on Eliquis.  Per EMS report the patient is coming from a facility where she rolled out of her bed and fell onto her face, no reported loss of consciousness.  She is on Eliquis for atrial fibrillation.  She was complaining of right shoulder pain to the EMS crew but has no other complaints but her history is limited secondary to significant dementia.  She has an abrasion to her forehead and nose but no lacerations.  With EMS her airway was intact and stable, breathing intact and stable, circulation intact and stable, her symptoms are mild, her symptoms are constant, her symptoms are unchanged.  She was not given any treatments prior to arrival.     Past Medical History:  Diagnosis Date  . Anemia   . Aortic stenosis, moderate (was mild) 08/2009; 08/2014   a. ECHO  EF 55-60%, wth increased EDP, mild AS;; b. mild Conc LVH, EF 60-65%, DD with high LVEDP, MIld-Mod AS, Mod MAC with Mod MR, mild-mod PA HTN.  [name] Complication of anesthesia    hallusinations  . Constipation   . Diabetes mellitus without complication ([place])    possibly  . Dyslipidemia    treated  . H/O cardiovascular stress test 05/2010   Lexiscan cardiolite no ischemia or infarction  . H/O multiple sclerosis ([place])    no excaerbations in some time  . History of ETT 02/2011   [place] exercise protocol, negative with poor exercise tolerance  . History of transfusion   . Hypertension   . Hypothyroidism   . Lower extremity edema    chronic  . Osteoarthritis    "generalized" (04/13/2018)  .  PAD (peripheral artery disease) ([place]) 08/30/2009   Dopplers; with bil. post. tib artery occlusion  . Permanent atrial fibrillation (HCC)    on Xarelto  . Stroke [facility]) ~2011/2012   "memory issues since" (04/13/2018)  . Transient ischemic attack (TIA) "she's had several"   "memory issues since" (04/13/2018)  . Vaginal itching   . Visual disturbances    "FLASHING IN MY EYES"    Patient Active Problem List   Diagnosis Date Noted  . TIA (transient ischemic attack)   . Delirium 07/04/2018  . Supratherapeutic INR 07/03/2018  . Supracondylar fracture of left femur, closed, initial encounter ([place]) 04/13/2018  . Trimalleolar fracture of ankle, closed, left, initial encounter 04/13/2018  . CKD (chronic kidney disease) stage 3, GFR 30-59 ml/min 04/13/2018  . Hyperglycemia 04/13/2018  . Dementia ([place]) 04/13/2018  . Periprosthetic fracture around internal prosthetic right hip joint ([place]) 02/05/2015  . Hypothyroidism 02/05/2015  . Obese 01/30/2015  . S/P right THA, AA 01/29/2015  . Rectal bleeding   . Anticoagulation adequate   . Stroke ([place])   . Dehydration 08/29/2014  . Nausea and vomiting 08/29/2014  . Diarrhea 08/29/2014  . Hypokalemia 08/29/2014  . Acute renal failure ([place]) 08/29/2014  . Leukocytosis 08/29/2014  . History of TIA (transient ischemic attack) 08/18/2014  . Essential hypertension -well controlled 01/10/2013  . Permanent atrial fibrillation (  [place]); CHA2DS2-VASc Score 6   . Chronic anticoagulation   . Dyslipidemia, goal LDL below 100 - due aortic stenosis   . Moderate aortic stenosis 08/17/2009    Past Surgical History:  Procedure Laterality Date  . BREAST SURGERY     L tumor removed - benign  . CATARACT EXTRACTION, BILATERAL Bilateral   . CESAREAN SECTION  1952; 1956; 1959  . CHOLECYSTECTOMY    . JOINT REPLACEMENT    . ORIF ANKLE FRACTURE Left 04/14/2018   Procedure: OPEN REDUCTION INTERNAL FIXATION (ORIF) ANKLE FRACTURE;  Surgeon: Shona Needles, MD;  Location: [facility];  Service: Orthopedics;  Laterality: Left;  . ORIF FEMUR FRACTURE Left 04/14/2018   Procedure: OPEN REDUCTION INTERNAL FIXATION (ORIF) DISTAL FEMUR FRACTURE;  Surgeon: Shona Needles, MD;  Location: [facility];  Service: Orthopedics;  Laterality: Left;  . ORIF PERIPROSTHETIC FRACTURE Right 02/07/2015   Procedure: OPEN REDUCTION INTERNAL FIXATION (ORIF) PERIPROSTHETIC FRACTURE WITH TOTAL HIP REVISION AND CABLES;  Surgeon: Paralee Cancel, MD;  Location: [facility];  Service: Orthopedics;  Laterality: Right;  . THYROIDECTOMY, PARTIAL Left   . TOTAL HIP ARTHROPLASTY Right 01/29/2015   Procedure: RIGHT TOTAL HIP ARTHROPLASTY ANTERIOR APPROACH;  Surgeon: Paralee Cancel, MD;  Location: [facility];  Service: Orthopedics;  Laterality: Right;  . TOTAL KNEE ARTHROPLASTY Left 2012     OB History   No obstetric history on file.     Family History  Problem Relation Age of Onset  . Heart failure Father 83  . Lung cancer Father     Social History   Tobacco Use  . Smoking status: Never Smoker  . Smokeless tobacco: Never Used  Substance Use Topics  . Alcohol use: No    Alcohol/week: 0.0 standard drinks  . Drug use: No    Types: Fentanyl    Home Medications Prior to Admission medications   Medication Sig Start Date End Date Taking? Authorizing Provider  acetaminophen (TYLENOL) 325 MG tablet Take 2 tablets (650 mg total) by mouth every 6 (six) hours as needed for mild pain or moderate pain. 04/16/18  Yes Regalado, Belkys A, MD  apixaban (ELIQUIS) 5 MG TABS tablet Take 5 mg by mouth 2 (two) times daily.   Yes [provider]  ARIPiprazole (ABILIFY) 5 MG tablet Take 5 mg by mouth daily.   Yes [provider]  atorvastatin (LIPITOR) 10 MG tablet Take 10 mg by mouth daily.   Yes [provider]  bisoprolol (ZEBETA) 10 MG tablet Take 1 tablet (10 mg total) by mouth daily. 04/17/18  Yes Regalado, Belkys A, MD  Calcium Citrate-Vitamin D (CITRUS CALCIUM/VITAMIN D PO) Take 1 tablet by mouth in  the morning and at bedtime.   Yes [provider]  cholecalciferol (VITAMIN D) 1000 UNITS tablet Take 1,000 Units by mouth every morning.    Yes [provider]  denosumab (PROLIA) 60 MG/ML SOSY injection Inject 60 mg into the skin every 6 (six) months.   Yes [provider]  docusate sodium (COLACE) 100 MG capsule Take 1 capsule (100 mg total) by mouth 2 (two) times daily. 04/16/18  Yes Regalado, Belkys A, MD  feeding supplement, ENSURE ENLIVE, (ENSURE ENLIVE) LIQD Take 237 mLs by mouth 2 (two) times daily between meals. 04/16/18  Yes Regalado, Belkys A, MD  levothyroxine (SYNTHROID) 137 MCG tablet Take 137 mcg by mouth daily before breakfast.   Yes [provider]  senna (SENOKOT) 8.6 MG tablet Take 1 tablet by mouth daily.  Yes [provider]  tamsulosin (FLOMAX) 0.4 MG CAPS capsule Take 0.4 mg by mouth daily.   Yes [provider]  triamterene-hydrochlorothiazide (DYAZIDE) 37.5-25 MG capsule Take 1 capsule by mouth daily.   Yes [provider]    Allergies    Tetanus antitoxin, Tramadol, Augmentin [amoxicillin-pot clavulanate], Codeine, Livalo [pitavastatin], and Meclizine  Review of Systems   Review of Systems  Unable to perform ROS: Dementia  Musculoskeletal:       Shoulder pain    Physical Exam Updated Vital Signs BP (!) 119/96   Pulse 68   Temp 97.9 F (36.6 C) (Oral)   Resp 16   Ht 5' (1.524 m)   Wt 59 kg   SpO2 98%   BMI 25.39 kg/m   Physical Exam Vitals and nursing note reviewed.  Constitutional:      Appearance: She is well-developed. She is not toxic-appearing or diaphoretic.  HENT:     Head: Normocephalic.     Comments: Abrasion to forehead and nose, tenderness to bridge of nose, no facial crepitus or deformity Eyes:     Conjunctiva/sclera: Conjunctivae normal.     Pupils: Pupils are equal, round, and reactive to light.  Neck:     Comments: No C-spine tenderness or step-offs.  In cervical  collar. Cardiovascular:     Rate and Rhythm: Normal rate. Rhythm irregular.     Pulses: Normal pulses.     Heart sounds: No murmur.  Pulmonary:     Effort: Pulmonary effort is normal. No respiratory distress.     Breath sounds: Normal breath sounds.  Abdominal:     Palpations: Abdomen is soft.     Tenderness: There is no abdominal tenderness. There is no guarding or rebound.     Comments: No bruising  Musculoskeletal:        General: Tenderness (right shoulder, right elbow, no other extremity ttp,  no thoracic or lumbar TTP) present. No deformity.     Right lower leg: No edema.     Left lower leg: No edema.  Skin:    General: Skin is warm and dry.  Neurological:     General: No focal deficit present.     Mental Status: She is alert. Mental status is at baseline.  Psychiatric:        Mood and Affect: Mood normal.        Behavior: Behavior is cooperative.     ED Results / Procedures / Treatments   Labs (all labs ordered are listed, but only abnormal results are displayed) Labs Reviewed  I-STAT CHEM 8, ED - Abnormal; Notable for the following components:      Result Value   BUN 30 (*)    Creatinine, Ser 1.10 (*)    Glucose, Bld 103 (*)    All other components within normal limits    EKG EKG Interpretation  Date/Time:  Friday October 20 2019 20:54:34 EST Ventricular Rate:  63 PR Interval:    QRS Duration: 94 QT Interval:  453 QTC Calculation: 464 R Axis:   54 Text Interpretation: Atrial fibrillation No acute changes Nonspecific ST and T wave abnormality No significant change since last tracing Confirmed by Varney Biles [phone]) on 10/20/2019 9:55:01 PM Also confirmed by Varney Biles [phone]), editor Jeanine Luz [phone])  on 10/21/2019 8:47:15 AM   Radiology DG Elbow 2 Views Right  Result Date: 10/20/2019 CLINICAL DATA:  Fall. EXAM: RIGHT ELBOW - 2 VIEW COMPARISON:  None. FINDINGS: No acute fracture or dislocation or  radiopaque foreign body. Mild bone demineralization  with a periarticular predisposition. Right calcified epicondylitis. No significant arthropathy or elbow joint effusion. IMPRESSION: No acute bony injury of the right elbow. Electronically Signed   By: Revonda Humphrey   On: 10/20/2019 22:09   CT Head Wo Contrast  Result Date: 10/20/2019 CLINICAL DATA:  Status post trauma. EXAM: CT HEAD WITHOUT CONTRAST TECHNIQUE: Contiguous axial images were obtained from the base of the skull through the vertex without intravenous contrast. COMPARISON:  None. FINDINGS: Brain: There is moderate severity cerebral atrophy with widening of the extra-axial spaces and ventricular dilatation. There are areas of decreased attenuation within the white matter tracts of the supratentorial brain, consistent with microvascular disease changes. Vascular: No hyperdense vessel or unexpected calcification. Skull: Normal. Negative for fracture or focal lesion. Sinuses/Orbits: No acute finding. Other: Mild right frontal scalp soft tissue swelling is seen. IMPRESSION: 1. Mild right frontal scalp soft tissue swelling, without evidence of acute fracture or acute intracranial abnormality. 2. Generalized cerebral atrophy. Electronically Signed   By: Virgina Norfolk M.D.   On: 10/20/2019 20:54   CT Cervical Spine Wo Contrast  Result Date: 10/20/2019 CLINICAL DATA:  Status post fall. EXAM: CT CERVICAL SPINE WITHOUT CONTRAST TECHNIQUE: Multidetector CT imaging of the cervical spine was performed without intravenous contrast. Multiplanar CT image reconstructions were also generated. COMPARISON:  April 13, 2018 FINDINGS: Alignment: There is stable approximately 2 mm retrolisthesis of the C4 vertebral body on C5. Skull base and vertebrae: No acute fracture. No primary bone lesion or focal pathologic process. Soft tissues and spinal canal: No prevertebral fluid or swelling. No visible canal hematoma. Disc levels: C2-3: There is mild end plate spondylosis. Mild disc space narrowing is seen. Bilateral facet  hypertrophy is noted. Normal central canal and intervertebral neuroforamina. C3-4: There is moderate severity end plate spondylosis. Moderate severity disc space narrowing is seen. Bilateral facet hypertrophy is noted. Normal central canal and intervertebral neuroforamina. C4-5: There is moderate severity end plate spondylosis. Moderate severity disc space narrowing is seen. Bilateral facet hypertrophy is noted. Normal central canal and intervertebral neuroforamina. C5-6: There is moderate severity end plate spondylosis. Moderate severity disc space narrowing is seen. Bilateral facet hypertrophy is noted. Normal central canal and intervertebral neuroforamina. C6-7: There is moderate severity end plate spondylosis. Moderateseverity disc space narrowing is seen. Bilateral facet hypertrophy is noted. Normal central canal and intervertebral neuroforamina. C7-T1: There is moderate severity end plate spondylosis. Moderate severity disc space narrowing is seen. Bilateral facet hypertrophy is noted. Normal central canal and intervertebral neuroforamina. Upper chest: Negative. Other: None. IMPRESSION: 1. Multilevel moderate severity spondylosis, unchanged from prior study. 2. No evidence of acute fracture or malalignment. Electronically Signed   By: Virgina Norfolk M.D.   On: 10/20/2019 21:00   DG Pelvis Portable  Result Date: 10/20/2019 CLINICAL DATA:  86 year old post fall. EXAM: PORTABLE PELVIS 1-2 VIEWS COMPARISON:  Pelvis and right hip radiograph 04/13/2018 FINDINGS: Right hip arthroplasty with cerclage wire fixation, intact were visualized. Distal aspect of the femoral stem not entirely included in the field of view. Lateral plate and screw fixation of the left femur is partially included. No evidence of acute pelvic fracture. Pubic rami appear intact. Pubic symphysis and sacroiliac joints are congruent. The bones are under mineralized. IMPRESSION: No evidence of acute fracture of the pelvis. Intact included right  hip hardware. Electronically Signed   By: Keith Rake M.D.   On: 10/20/2019 20:30   DG Chest Port 1 View  Result Date: 10/20/2019  CLINICAL DATA:  86 year old post fall. Right shoulder pain. EXAM: PORTABLE CHEST 1 VIEW COMPARISON:  Radiograph 07/04/2018 FINDINGS: Stable cardiomegaly. Unchanged mediastinal contours with aortic atherosclerosis and tortuosity. No pneumothorax or focal airspace disease. Chronic interstitial coarsening. No large pleural effusion. No grossly displaced rib fracture or evidence of acute osseous abnormality. Chronic degenerative change of the left shoulder. IMPRESSION: Stable cardiomegaly and chronic interstitial coarsening. No acute findings are evidence of acute traumatic injury. Aortic Atherosclerosis (ICD10-I70.0). Electronically Signed   By: Keith Rake M.D.   On: 10/20/2019 20:28   DG Shoulder Right Portable  Result Date: 10/20/2019 CLINICAL DATA:  86 year old post fall. Right shoulder pain. EXAM: PORTABLE RIGHT SHOULDER COMPARISON:  None. FINDINGS: There is no evidence of fracture or dislocation. Mild acromioclavicular degenerative change. Glenohumeral joint not well assessed on provided views. Soft tissues are unremarkable. IMPRESSION: No evidence of acute fracture or dislocation of the right shoulder. Electronically Signed   By: Keith Rake M.D.   On: 10/20/2019 20:31   CT Maxillofacial Wo Contrast  Result Date: 10/20/2019 CLINICAL DATA:  Status post fall. EXAM: CT MAXILLOFACIAL WITHOUT CONTRAST TECHNIQUE: Multidetector CT imaging of the maxillofacial structures was performed. Multiplanar CT image reconstructions were also generated. COMPARISON:  None. FINDINGS: Osseous: No fracture or mandibular dislocation. No destructive process. Orbits: Negative. No traumatic or inflammatory finding. Sinuses: Clear. Soft tissues: There is mild right frontal scalp soft tissue swelling. Limited intracranial: No significant or unexpected finding. IMPRESSION: 1. No evidence  for acute fracture. 2. Mild right frontal scalp soft tissue swelling. Electronically Signed   By: Virgina Norfolk M.D.   On: 10/20/2019 21:01    Procedures Procedures (including critical care time)  Medications Ordered in ED Medications - No data to display  ED Course  I have reviewed the triage vital signs and the nursing notes.  Pertinent labs & imaging results that were available during my care of the patient were reviewed by me and considered in my medical decision making (see chart for details).    MDM Rules/Calculators/A&P                      Here as a level 2 trauma for fall on blood thinner.  Per report from EMS as well as the patient's son she rolled out of her bed tonight and struck her face.  CT and x-ray of the affected sites without any emergent traumatic findings.  I-STAT labs without emergent changes, and patient is otherwise at her baseline and her normal state of health per the patient's son.  EKG without any emergent findings.  Patient able to stand with assistance which is her baseline.  Will discharge back home to her facility.  Strict return precautions provided to the patient's son, instructed to follow-up with her primary care provider.  Discharged home in stable condition.   Final Clinical Impression(s) / ED Diagnoses Final diagnoses:  Fall, initial encounter    Rx / DC Orders ED Discharge Orders    None       Ali Mclaurin, [place], MD 10/21/19 [place], [place], MD 10/22/19 [phone]

## 2019-10-20 NOTE — ED Notes (Addendum)
Per pt family member, Pt is unable to walk at baseline. She is able to stand and turn to sit in chair/wheelchair/bed @ home. Pt was able to stand up with assistance and shuffle a few steps and sit back down in bed. Per family that was her baseline.

## 2019-10-31 ENCOUNTER — Telehealth: Payer: Self-pay | Admitting: *Deleted

## 2019-10-31 NOTE — Telephone Encounter (Signed)
A message was left, re: follow up visit. 

## 2019-10-31 NOTE — Telephone Encounter (Signed)
A message was

## 2019-11-04 ENCOUNTER — Emergency Department (HOSPITAL_COMMUNITY): Payer: Medicare Other

## 2019-11-04 ENCOUNTER — Encounter (HOSPITAL_COMMUNITY): Payer: Self-pay | Admitting: Emergency Medicine

## 2019-11-04 ENCOUNTER — Emergency Department (HOSPITAL_COMMUNITY)
Admission: EM | Admit: 2019-11-04 | Discharge: 2019-11-04 | Disposition: A | Discharge status: Home / Self Care | Payer: Medicare Other | Attending: Emergency Medicine | Admitting: Emergency Medicine

## 2019-11-04 DIAGNOSIS — I129 Hypertensive chronic kidney disease with stage 1 through stage 4 chronic kidney disease, or unspecified chronic kidney disease: Secondary | ICD-10-CM | POA: Insufficient documentation

## 2019-11-04 DIAGNOSIS — E039 Hypothyroidism, unspecified: Secondary | ICD-10-CM | POA: Insufficient documentation

## 2019-11-04 DIAGNOSIS — Y999 Unspecified external cause status: Secondary | ICD-10-CM | POA: Diagnosis not present

## 2019-11-04 DIAGNOSIS — Z79899 Other long term (current) drug therapy: Secondary | ICD-10-CM | POA: Diagnosis not present

## 2019-11-04 DIAGNOSIS — S299XXA Unspecified injury of thorax, initial encounter: Secondary | ICD-10-CM | POA: Diagnosis not present

## 2019-11-04 DIAGNOSIS — Z96641 Presence of right artificial hip joint: Secondary | ICD-10-CM | POA: Insufficient documentation

## 2019-11-04 DIAGNOSIS — Z7901 Long term (current) use of anticoagulants: Secondary | ICD-10-CM | POA: Diagnosis not present

## 2019-11-04 DIAGNOSIS — F0390 Unspecified dementia without behavioral disturbance: Secondary | ICD-10-CM | POA: Diagnosis not present

## 2019-11-04 DIAGNOSIS — S0990XA Unspecified injury of head, initial encounter: Secondary | ICD-10-CM | POA: Diagnosis not present

## 2019-11-04 DIAGNOSIS — Z8673 Personal history of transient ischemic attack (TIA), and cerebral infarction without residual deficits: Secondary | ICD-10-CM | POA: Insufficient documentation

## 2019-11-04 DIAGNOSIS — S3993XA Unspecified injury of pelvis, initial encounter: Secondary | ICD-10-CM | POA: Diagnosis not present

## 2019-11-04 DIAGNOSIS — Y939 Activity, unspecified: Secondary | ICD-10-CM | POA: Insufficient documentation

## 2019-11-04 DIAGNOSIS — R0902 Hypoxemia: Secondary | ICD-10-CM | POA: Diagnosis not present

## 2019-11-04 DIAGNOSIS — M25552 Pain in left hip: Secondary | ICD-10-CM | POA: Diagnosis not present

## 2019-11-04 DIAGNOSIS — Z7401 Bed confinement status: Secondary | ICD-10-CM | POA: Diagnosis not present

## 2019-11-04 DIAGNOSIS — W19XXXA Unspecified fall, initial encounter: Secondary | ICD-10-CM

## 2019-11-04 DIAGNOSIS — W050XXA Fall from non-moving wheelchair, initial encounter: Secondary | ICD-10-CM | POA: Insufficient documentation

## 2019-11-04 DIAGNOSIS — N183 Chronic kidney disease, stage 3 unspecified: Secondary | ICD-10-CM | POA: Diagnosis not present

## 2019-11-04 DIAGNOSIS — Z96652 Presence of left artificial knee joint: Secondary | ICD-10-CM | POA: Insufficient documentation

## 2019-11-04 DIAGNOSIS — M2550 Pain in unspecified joint: Secondary | ICD-10-CM | POA: Diagnosis not present

## 2019-11-04 DIAGNOSIS — M79605 Pain in left leg: Secondary | ICD-10-CM | POA: Diagnosis not present

## 2019-11-04 DIAGNOSIS — Y92129 Unspecified place in nursing home as the place of occurrence of the external cause: Secondary | ICD-10-CM | POA: Diagnosis not present

## 2019-11-04 DIAGNOSIS — S199XXA Unspecified injury of neck, initial encounter: Secondary | ICD-10-CM | POA: Diagnosis not present

## 2019-11-04 DIAGNOSIS — R4182 Altered mental status, unspecified: Secondary | ICD-10-CM | POA: Diagnosis not present

## 2019-11-04 DIAGNOSIS — Z9049 Acquired absence of other specified parts of digestive tract: Secondary | ICD-10-CM | POA: Insufficient documentation

## 2019-11-04 DIAGNOSIS — S79912A Unspecified injury of left hip, initial encounter: Secondary | ICD-10-CM | POA: Diagnosis not present

## 2019-11-04 DIAGNOSIS — Z66 Do not resuscitate: Secondary | ICD-10-CM | POA: Insufficient documentation

## 2019-11-04 DIAGNOSIS — G459 Transient cerebral ischemic attack, unspecified: Secondary | ICD-10-CM | POA: Diagnosis not present

## 2019-11-04 DIAGNOSIS — S8991XA Unspecified injury of right lower leg, initial encounter: Secondary | ICD-10-CM | POA: Diagnosis not present

## 2019-11-04 DIAGNOSIS — Z743 Need for continuous supervision: Secondary | ICD-10-CM | POA: Diagnosis not present

## 2019-11-04 DIAGNOSIS — R404 Transient alteration of awareness: Secondary | ICD-10-CM | POA: Diagnosis not present

## 2019-11-04 LAB — CBC
HCT: 42.2 % (ref 36.0–46.0)
Hemoglobin: 13.4 g/dL (ref 12.0–15.0)
MCH: 30.5 pg (ref 26.0–34.0)
MCHC: 31.8 g/dL (ref 30.0–36.0)
MCV: 95.9 fL (ref 80.0–100.0)
Platelets: 220 10*3/uL (ref 150–400)
RBC: 4.4 MIL/uL (ref 3.87–5.11)
RDW: 13.3 % (ref 11.5–15.5)
WBC: 7.1 10*3/uL (ref 4.0–10.5)
nRBC: 0 % (ref 0.0–0.2)

## 2019-11-04 LAB — COMPREHENSIVE METABOLIC PANEL
ALT: 19 U/L (ref 0–44)
AST: 28 U/L (ref 15–41)
Albumin: 3.9 g/dL (ref 3.5–5.0)
Alkaline Phosphatase: 86 U/L (ref 38–126)
Anion gap: 11 (ref 5–15)
BUN: 29 mg/dL — ABNORMAL HIGH (ref 8–23)
CO2: 26 mmol/L (ref 22–32)
Calcium: 9.1 mg/dL (ref 8.9–10.3)
Chloride: 105 mmol/L (ref 98–111)
Creatinine, Ser: 0.94 mg/dL (ref 0.44–1.00)
GFR calc Af Amer: >60 mL/min (ref >60)
GFR calc non Af Amer: 53 mL/min — ABNORMAL LOW (ref >60)
Glucose, Bld: 112 mg/dL — ABNORMAL HIGH (ref 70–99)
Potassium: 4.1 mmol/L (ref 3.5–5.1)
Sodium: 142 mmol/L (ref 135–145)
Total Bilirubin: 0.6 mg/dL (ref 0.3–1.2)
Total Protein: 6.4 g/dL — ABNORMAL LOW (ref 6.5–8.1)

## 2019-11-04 LAB — SAMPLE TO BLOOD BANK

## 2019-11-04 LAB — I-STAT CHEM 8, ED
BUN: 35 mg/dL — ABNORMAL HIGH (ref 8–23)
Calcium, Ion: 1.14 mmol/L — ABNORMAL LOW (ref 1.15–1.40)
Chloride: 105 mmol/L (ref 98–111)
Creatinine, Ser: 1 mg/dL (ref 0.44–1.00)
Glucose, Bld: 105 mg/dL — ABNORMAL HIGH (ref 70–99)
HCT: 39 % (ref 36.0–46.0)
Hemoglobin: 13.3 g/dL (ref 12.0–15.0)
Potassium: 4.3 mmol/L (ref 3.5–5.1)
Sodium: 143 mmol/L (ref 135–145)
TCO2: 30 mmol/L (ref 22–32)

## 2019-11-04 LAB — LACTIC ACID, PLASMA: Lactic Acid, Venous: 1.4 mmol/L (ref 0.5–1.9)

## 2019-11-04 LAB — PROTIME-INR
INR: 1.2 (ref 0.8–1.2)
Prothrombin Time: 15.3 seconds — ABNORMAL HIGH (ref 11.4–15.2)

## 2019-11-04 MED ORDER — ACETAMINOPHEN 325 MG PO TABS
650.0000 mg | ORAL_TABLET | Freq: Once | ORAL | Status: AC
  Administered 2019-11-04: 650 mg via ORAL
  Filled 2019-11-04: qty 2

## 2019-11-04 NOTE — ED Notes (Signed)
[facility] called @ [phone]-per RN called by Marylene Land

## 2019-11-04 NOTE — ED Provider Notes (Signed)
Care transferred to me.  Patient's x-rays and CTs are unremarkable.  She is able to bear weight though normally she does not walk much and is pretty much wheelchair-bound.  Discussed with son and at this point she appears stable to go back to her assisted living and he will work on seeing if she can get higher level of care at the facility.  Will give Tylenol.   Pricilla Loveless, MD 11/04/19 [phone]

## 2019-11-04 NOTE — ED Provider Notes (Signed)
[place] EMERGENCY DEPARTMENT Provider Note   CSN: [id number] Arrival date & time: 11/04/19  1450     History Chief Complaint  Patient presents with  . Fall  . Hip Injury    Makayla Thomas is a 86 y.o. female w/ hx of advanced dementia, aortic stenosis, PAD on eliquis, who is DNR/DNI and comfort care per her signed MOST form on arrival (and confirmed by her son in the ED, presented to the emergency department from a nursing facility with concern for possible fall out of wheelchair.  Nurse reports that the patient had unwitnessed fall this is backed out of her wheelchair.  She was complaining of significant pain in her left leg.  EMS thought that the leg was shortened and rotated and placed a pelvic binder in the field.  They gave the patient 50 mcg of fentanyl.  On arrival in the ED the patient was moaning and unable to provide any reliable history.  After my initial assessment I spoke to her son who was present in her waiting room.  He confirms that the patient has been in rapid decline in terms of her mental status and mobility since she broke her other leg in the past few years.  He has a signed MOST form and reiterates that the patient is DNR/DNI.  He wishes to be kept informed of any major changes, but would not want any invasive procedures or prolonged hospitalizations.  Her orthopedist was Dr Doreatha Martin during her prior injuries.  HPI     Past Medical History:  Diagnosis Date  . Anemia   . Aortic stenosis, moderate (was mild) 08/2009; 08/2014   a. ECHO  EF 55-60%, wth increased EDP, mild AS;; b. mild Conc LVH, EF 60-65%, DD with high LVEDP, MIld-Mod AS, Mod MAC with Mod MR, mild-mod PA HTN.  [name] Complication of anesthesia    hallusinations  . Constipation   . Diabetes mellitus without complication ([place])    possibly  . Dyslipidemia    treated  . H/O cardiovascular stress test 05/2010   Lexiscan cardiolite no ischemia or infarction  . H/O multiple sclerosis  ([place])    no excaerbations in some time  . History of ETT 02/2011   [place] exercise protocol, negative with poor exercise tolerance  . History of transfusion   . Hypertension   . Hypothyroidism   . Lower extremity edema    chronic  . Osteoarthritis    "generalized" (04/13/2018)  . PAD (peripheral artery disease) ([place]) 08/30/2009   Dopplers; with bil. post. tib artery occlusion  . Permanent atrial fibrillation (HCC)    on Xarelto  . Stroke [facility]) ~2011/2012   "memory issues since" (04/13/2018)  . Transient ischemic attack (TIA) "she's had several"   "memory issues since" (04/13/2018)  . Vaginal itching   . Visual disturbances    "FLASHING IN MY EYES"    Patient Active Problem List   Diagnosis Date Noted  . TIA (transient ischemic attack)   . Delirium 07/04/2018  . Supratherapeutic INR 07/03/2018  . Supracondylar fracture of left femur, closed, initial encounter ([place]) 04/13/2018  . Trimalleolar fracture of ankle, closed, left, initial encounter 04/13/2018  . CKD (chronic kidney disease) stage 3, GFR 30-59 ml/min 04/13/2018  . Hyperglycemia 04/13/2018  . Dementia ([place]) 04/13/2018  . Periprosthetic fracture around internal prosthetic right hip joint ([place]) 02/05/2015  . Hypothyroidism 02/05/2015  . Obese 01/30/2015  . S/P right THA, AA 01/29/2015  . Rectal bleeding   . Anticoagulation  adequate   . Stroke ([place])   . Dehydration 08/29/2014  . Nausea and vomiting 08/29/2014  . Diarrhea 08/29/2014  . Hypokalemia 08/29/2014  . Acute renal failure ([place]) 08/29/2014  . Leukocytosis 08/29/2014  . History of TIA (transient ischemic attack) 08/18/2014  . Essential hypertension -well controlled 01/10/2013  . Permanent atrial fibrillation ([place]); CHA2DS2-VASc Score 6   . Chronic anticoagulation   . Dyslipidemia, goal LDL below 100 - due aortic stenosis   . Moderate aortic stenosis 08/17/2009    Past Surgical History:  Procedure Laterality Date  . BREAST SURGERY     L tumor removed -  benign  . CATARACT EXTRACTION, BILATERAL Bilateral   . CESAREAN SECTION  1952; 1956; 1959  . CHOLECYSTECTOMY    . JOINT REPLACEMENT    . ORIF ANKLE FRACTURE Left 04/14/2018   Procedure: OPEN REDUCTION INTERNAL FIXATION (ORIF) ANKLE FRACTURE;  Surgeon: Shona Needles, MD;  Location: [place];  Service: Orthopedics;  Laterality: Left;  . ORIF FEMUR FRACTURE Left 04/14/2018   Procedure: OPEN REDUCTION INTERNAL FIXATION (ORIF) DISTAL FEMUR FRACTURE;  Surgeon: Shona Needles, MD;  Location: [place];  Service: Orthopedics;  Laterality: Left;  . ORIF PERIPROSTHETIC FRACTURE Right 02/07/2015   Procedure: OPEN REDUCTION INTERNAL FIXATION (ORIF) PERIPROSTHETIC FRACTURE WITH TOTAL HIP REVISION AND CABLES;  Surgeon: Paralee Cancel, MD;  Location: [facility];  Service: Orthopedics;  Laterality: Right;  . THYROIDECTOMY, PARTIAL Left   . TOTAL HIP ARTHROPLASTY Right 01/29/2015   Procedure: RIGHT TOTAL HIP ARTHROPLASTY ANTERIOR APPROACH;  Surgeon: Paralee Cancel, MD;  Location: [facility];  Service: Orthopedics;  Laterality: Right;  . TOTAL KNEE ARTHROPLASTY Left 2012     OB History   No obstetric history on file.     Family History  Problem Relation Age of Onset  . Heart failure Father 23  . Lung cancer Father     Social History   Tobacco Use  . Smoking status: Never Smoker  . Smokeless tobacco: Never Used  Substance Use Topics  . Alcohol use: No    Alcohol/week: 0.0 standard drinks  . Drug use: No    Types: Fentanyl    Home Medications Prior to Admission medications   Medication Sig Start Date End Date Taking? Authorizing Provider  acetaminophen (TYLENOL) 325 MG tablet Take 2 tablets (650 mg total) by mouth every 6 (six) hours as needed for mild pain or moderate pain. 04/16/18  Yes Regalado, Belkys A, MD  apixaban (ELIQUIS) 5 MG TABS tablet Take 5 mg by mouth 2 (two) times daily.   Yes [provider]  ARIPiprazole (ABILIFY) 5 MG tablet Take 5 mg by mouth every evening.   Yes [provider]  atorvastatin (LIPITOR) 10 MG tablet Take 10 mg by mouth daily at 6 PM.   Yes [provider]  bisoprolol (ZEBETA) 10 MG tablet Take 1 tablet (10 mg total) by mouth daily. 04/17/18  Yes Regalado, Belkys A, MD  Calcium Citrate-Vitamin D (CITRUS CALCIUM/VITAMIN D PO) Take 1 tablet by mouth in the morning and at bedtime.   Yes [provider]  cholecalciferol (VITAMIN D) 1000 UNITS tablet Take 1,000 Units by mouth every morning.    Yes [provider]  denosumab (PROLIA) 60 MG/ML SOSY injection Inject 60 mg into the skin every 6 (six) months.   Yes [provider]  docusate sodium (COLACE) 100 MG capsule Take 1 capsule (100 mg total) by mouth 2 (two) times daily. 04/16/18  Yes Regalado, Belkys A,  MD  feeding supplement, ENSURE ENLIVE, (ENSURE ENLIVE) LIQD Take 237 mLs by mouth 2 (two) times daily between meals. Patient taking differently: Take 237 mLs by mouth daily as needed (supplement).  04/16/18  Yes Regalado, Belkys A, MD  Ketoconazole-Hydrocortisone 2-2.5 % CREA Apply 1 application topically every 12 (twelve) hours as needed (itching).   Yes [provider]  levothyroxine (SYNTHROID) 137 MCG tablet Take 137 mcg by mouth daily before breakfast.   Yes [provider]  senna (SENOKOT) 8.6 MG tablet Take 1 tablet by mouth daily.   Yes [provider]  tamsulosin (FLOMAX) 0.4 MG CAPS capsule Take 0.4 mg by mouth daily.   Yes [provider]  triamterene-hydrochlorothiazide (MAXZIDE-25) 37.5-25 MG tablet Take 0.5 tablets by mouth daily.    Yes [provider]    Allergies    Tetanus antitoxin, Tramadol, Augmentin [amoxicillin-pot clavulanate], Codeine, Livalo [pitavastatin], and Meclizine  Review of Systems   Review of Systems  Unable to perform ROS: Dementia (level 5 caveat)    Physical Exam Updated Vital Signs BP (!) 147/67   Pulse 63   Temp (!) 97 F (36.1 C) (Temporal)   Resp 14   Ht 5' (1.524  m)   Wt 58.9 kg   SpO2 99%   BMI 25.36 kg/m   Physical Exam Vitals and nursing note reviewed.  Constitutional:      Appearance: She is well-developed.  HENT:     Head: Normocephalic and atraumatic. No raccoon eyes or Battle's sign.     Comments: Old bruising on cheek Eyes:     Conjunctiva/sclera: Conjunctivae normal.     Pupils: Pupils are equal, round, and reactive to light.  Cardiovascular:     Rate and Rhythm: Normal rate and regular rhythm.     Pulses: Normal pulses.     Comments: Pedal pulses weak but palpable Pulmonary:     Effort: Pulmonary effort is normal. No respiratory distress.     Breath sounds: Normal breath sounds.  Abdominal:     General: There is no distension.     Palpations: Abdomen is soft.     Tenderness: There is no abdominal tenderness.  Musculoskeletal:     Cervical back: Neck supple.     Comments: Left leg shortened and everted Pain with elevation or movement of left leg (at hip) No gross hip deformity  Skin:    General: Skin is warm and dry.  Neurological:     Mental Status: She is alert. Mental status is at baseline.     ED Results / Procedures / Treatments   Labs (all labs ordered are listed, but only abnormal results are displayed) Labs Reviewed  COMPREHENSIVE METABOLIC PANEL - Abnormal; Notable for the following components:      Result Value   Glucose, Bld 112 (*)    BUN 29 (*)    Total Protein 6.4 (*)    GFR calc non Af Amer 53 (*)    All other components within normal limits  PROTIME-INR - Abnormal; Notable for the following components:   Prothrombin Time 15.3 (*)    All other components within normal limits  I-STAT CHEM 8, ED - Abnormal; Notable for the following components:   BUN 35 (*)    Glucose, Bld 105 (*)    Calcium, Ion 1.14 (*)    All other components within normal limits  CBC  LACTIC ACID, PLASMA  ETHANOL  URINALYSIS, ROUTINE W REFLEX MICROSCOPIC  SAMPLE TO BLOOD BANK  EKG None  Radiology CT Head Wo  Contrast  Result Date: 11/04/2019 CLINICAL DATA:  Status post trauma. EXAM: CT HEAD WITHOUT CONTRAST TECHNIQUE: Contiguous axial images were obtained from the base of the skull through the vertex without intravenous contrast. COMPARISON:  October 20, 2019 FINDINGS: Brain: There is moderate cerebral atrophy with widening of the extra-axial spaces and ventricular dilatation. There are areas of decreased attenuation within the white matter tracts of the supratentorial brain, consistent with microvascular disease changes. Vascular: No hyperdense vessel or unexpected calcification. Skull: Normal. Negative for fracture or focal lesion. Sinuses/Orbits: No acute finding. Other: None. IMPRESSION: 1. Generalized cerebral atrophy. 2. No acute intracranial abnormality. Electronically Signed   By: Virgina Norfolk M.D.   On: 11/04/2019 15:50   CT Cervical Spine Wo Contrast  Result Date: 11/04/2019 CLINICAL DATA:  Status post fall. EXAM: CT CERVICAL SPINE WITHOUT CONTRAST TECHNIQUE: Multidetector CT imaging of the cervical spine was performed without intravenous contrast. Multiplanar CT image reconstructions were also generated. COMPARISON:  October 20, 2019 FINDINGS: Alignment: Approximately 2 mm retrolisthesis of the C4 on C5 vertebral body is seen. Skull base and vertebrae: No acute fracture. No primary bone lesion or focal pathologic process. Soft tissues and spinal canal: No prevertebral fluid or swelling. No visible canal hematoma. Disc levels: C2-3: There is mild end plate spondylosis. Mild disc space narrowing is seen. Bilateral facet hypertrophy is noted. Normal central canal and intervertebral neuroforamina. C3-4: There is moderate severity end plate spondylosis. Moderate to marked severity disc space narrowing is seen. Bilateral facet hypertrophy is noted. Normal central canal and intervertebral neuroforamina. C4-5: There is moderate severity end plate spondylosis. Moderate to marked severity disc space narrowing is  seen. Bilateral facet hypertrophy is noted. Normal central canal and intervertebral neuroforamina. C5-6: There is moderate severity end plate spondylosis. Moderate severity disc space narrowing is seen. Bilateral facet hypertrophy is noted. Normal central canal and intervertebral neuroforamina. C6-7: There is moderate severity end plate spondylosis. Moderate severity disc space narrowing is seen. Bilateral facet hypertrophy is noted. Normal central canal and intervertebral neuroforamina. C7-T1: There is moderate severity end plate spondylosis. Moderate severity disc space narrowing is seen. Bilateral facet hypertrophy is noted. Normal central canal and intervertebral neuroforamina. Upper chest: Negative. Other: None. IMPRESSION: 1. Multilevel disc spondylosis and facet hypertrophy. 2. Approximately 2 mm retrolisthesis of C4 on C5, likely degenerative. 3. No evidence of acute fracture. Electronically Signed   By: Virgina Norfolk M.D.   On: 11/04/2019 15:54   DG Pelvis Portable  Result Date: 11/04/2019 CLINICAL DATA:  Status post trauma. EXAM: PORTABLE PELVIS 1-2 VIEWS COMPARISON:  October 20, 2019 FINDINGS: A total right hip replacement is seen. There is no evidence of surrounding lucency to suggest the presence of hardware loosening or infection. A radiopaque fixation plate and multiple fixation screws are seen overlying the visualized portion of the mid left femoral shaft. There is no evidence of an acute pelvic fracture or diastasis. No pelvic bone lesions are seen. Subcentimeter phleboliths are seen overlying the lower right hemipelvis. IMPRESSION: 1. No evidence of an acute pelvic fracture or diastasis. 2. Right total hip replacement without evidence of hardware loosening or infection. Electronically Signed   By: Virgina Norfolk M.D.   On: 11/04/2019 15:16   CT Hip Left Wo Contrast  Result Date: 11/04/2019 CLINICAL DATA:  Status post fall. EXAM: CT OF THE LEFT HIP WITHOUT CONTRAST TECHNIQUE:  Multidetector CT imaging of the left hip was performed according to the standard protocol. Multiplanar CT  image reconstructions were also generated. COMPARISON:  None. FINDINGS: Bones/Joint/Cartilage Normal left femoral head, neck, intertrochanteric region and visualized proximal femur. Mild sclerotic changes seen involving the left acetabulum with mild narrowing of the left hip joint. Normal visualized superior and inferior pubic rami and ischial tuberosities. A total right hip replacement is seen with associated streak artifact and subsequently limited evaluation of the adjacent osseous and soft tissue structures. Moderate to marked severity degenerative changes seen within the visualized portion of the lower lumbar spine. Ligaments Suboptimally assessed by CT. Muscles and Tendons There is no evidence of an intramuscular fluid collection, abscess or hematoma. Soft tissues Multiple small left adnexal cysts are seen. Noninflamed diverticula are seen within the sigmoid colon. IMPRESSION: 1. No acute fracture or dislocation. 2. 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 3. Total right hip replacement. Electronically Signed   By: Virgina Norfolk M.D.   On: 11/04/2019 16:02   DG Chest Port 1 View  Result Date: 11/04/2019 CLINICAL DATA:  Status post trauma. EXAM: PORTABLE CHEST 1 VIEW COMPARISON:  October 20, 2019 FINDINGS: Stable, moderate severity chronic appearing increased interstitial lung markings are seen. There is no evidence of a pleural effusion or pneumothorax. The cardiac silhouette is markedly enlarged and unchanged in size. There is marked severity calcification of the aortic arch. Multilevel degenerative changes seen throughout the thoracic spine. IMPRESSION: Stable, marked cardiomegaly and chronic-appearing increased lung markings without evidence of acute or active cardiopulmonary disease. Electronically Signed   By: Virgina Norfolk M.D.   On: 11/04/2019 15:18   DG Knee Complete 4 Views  Right  Result Date: 11/04/2019 CLINICAL DATA:  Fall from wheelchair. EXAM: RIGHT KNEE - COMPLETE 4+ VIEW COMPARISON:  None. FINDINGS: No evidence of fracture, dislocation, or joint effusion. Vascular calcifications are noted. Severe narrowing of lateral joint space is noted. IMPRESSION: Severe degenerative joint disease. No acute abnormality seen in the right knee. Electronically Signed   By: Marijo Conception M.D.   On: 11/04/2019 16:15    Procedures .Critical Care Performed by: Wyvonnia Dusky, MD Authorized by: Wyvonnia Dusky, MD   Critical care provider statement:    Critical care time (minutes):  40   Critical care was necessary to treat or prevent imminent or life-threatening deterioration of the following conditions:  Trauma   Critical care was time spent personally by me on the following activities:  Discussions with consultants, evaluation of patient's response to treatment, examination of patient, ordering and performing treatments and interventions, ordering and review of laboratory studies, ordering and review of radiographic studies, pulse oximetry, re-evaluation of patient's condition, obtaining history from patient or surrogate and review of old charts Comments:     Level 2 trauma requiring full trauma evaluation, bedside FAST exam, CT imaging, and goals of care discussion with family members   (including critical care time)  Medications Ordered in ED Medications - No data to display  ED Course  I have reviewed the triage vital signs and the nursing notes.  Pertinent labs & imaging results that were available during my care of the patient were reviewed by me and considered in my medical decision making (see chart for details).  86 year old female presented to emergency department suspected mechanical fall with left hip pain on arrival.  Patient is on Eliquis.  She is DNR/DNI and largely comfort care measures as confirmed by her son and her MOST form.  She provides very  poor history.  My suspicion arrival is that she may have a  hip fracture given the amount of pain she was having in her left hip, particularly with manipulation of the hip.  However her x-ray and her CT scan did not show any evidence of acute fracture.  She does have peripheral pulses intact.  It is unclear whether she may have an occult fracture not initially visible on the scans.  CT scan of the head and C-spine were negative.  Her labs are still pending.    Signed out to Dr Verta Ellen with plan to finish trauma evaluation, follow up with family regarding further care wishes.  I doubt she is capable of ambulating at the moment.     Final Clinical Impression(s) / ED Diagnoses Final diagnoses:  Fall  Fall    Rx / DC Orders ED Discharge Orders    None       Wyvonnia Dusky, MD 11/04/19 [phone]

## 2019-11-04 NOTE — Progress Notes (Signed)
Orthopedic Tech Progress Note Patient Details:  Makayla Thomas 03/11/1926 [id number]  Ortho Devices Ortho Device/Splint Location: Level 2 Trauma alert       Saul Fordyce 11/04/2019, 3:23 PM

## 2019-11-04 NOTE — ED Triage Notes (Signed)
Pt arrives via EMS from [facility] after a fall out of her wheelchair. Pt has frequent falls with old bruising. EMS gave 50 mcg fentanyl. Pt alert to baseline. Shortening and rotation to left leg. Able to doppler pulses.

## 2019-11-08 DIAGNOSIS — E785 Hyperlipidemia, unspecified: Secondary | ICD-10-CM | POA: Diagnosis not present

## 2019-11-08 DIAGNOSIS — I350 Nonrheumatic aortic (valve) stenosis: Secondary | ICD-10-CM | POA: Diagnosis not present

## 2019-11-08 DIAGNOSIS — I69318 Other symptoms and signs involving cognitive functions following cerebral infarction: Secondary | ICD-10-CM | POA: Diagnosis not present

## 2019-11-08 DIAGNOSIS — G35 Multiple sclerosis: Secondary | ICD-10-CM | POA: Diagnosis not present

## 2019-11-08 DIAGNOSIS — Z741 Need for assistance with personal care: Secondary | ICD-10-CM | POA: Diagnosis not present

## 2019-11-08 DIAGNOSIS — M810 Age-related osteoporosis without current pathological fracture: Secondary | ICD-10-CM | POA: Diagnosis not present

## 2019-11-08 DIAGNOSIS — Z87310 Personal history of (healed) osteoporosis fracture: Secondary | ICD-10-CM | POA: Diagnosis not present

## 2019-11-08 DIAGNOSIS — Z6827 Body mass index (BMI) 27.0-27.9, adult: Secondary | ICD-10-CM | POA: Diagnosis not present

## 2019-11-08 DIAGNOSIS — I10 Essential (primary) hypertension: Secondary | ICD-10-CM | POA: Diagnosis not present

## 2019-11-08 DIAGNOSIS — Z8639 Personal history of other endocrine, nutritional and metabolic disease: Secondary | ICD-10-CM | POA: Diagnosis not present

## 2019-11-08 DIAGNOSIS — E039 Hypothyroidism, unspecified: Secondary | ICD-10-CM | POA: Diagnosis not present

## 2019-11-08 DIAGNOSIS — I739 Peripheral vascular disease, unspecified: Secondary | ICD-10-CM | POA: Diagnosis not present

## 2019-11-08 DIAGNOSIS — F0280 Dementia in other diseases classified elsewhere without behavioral disturbance: Secondary | ICD-10-CM | POA: Diagnosis not present

## 2019-11-08 DIAGNOSIS — I482 Chronic atrial fibrillation, unspecified: Secondary | ICD-10-CM | POA: Diagnosis not present

## 2019-11-08 DIAGNOSIS — G309 Alzheimer's disease, unspecified: Secondary | ICD-10-CM | POA: Diagnosis not present

## 2019-11-08 DIAGNOSIS — F329 Major depressive disorder, single episode, unspecified: Secondary | ICD-10-CM | POA: Diagnosis not present

## 2019-11-08 DIAGNOSIS — F0150 Vascular dementia without behavioral disturbance: Secondary | ICD-10-CM | POA: Diagnosis not present

## 2019-11-10 DIAGNOSIS — I350 Nonrheumatic aortic (valve) stenosis: Secondary | ICD-10-CM | POA: Diagnosis not present

## 2019-11-10 DIAGNOSIS — I69318 Other symptoms and signs involving cognitive functions following cerebral infarction: Secondary | ICD-10-CM | POA: Diagnosis not present

## 2019-11-10 DIAGNOSIS — I739 Peripheral vascular disease, unspecified: Secondary | ICD-10-CM | POA: Diagnosis not present

## 2019-11-10 DIAGNOSIS — F0280 Dementia in other diseases classified elsewhere without behavioral disturbance: Secondary | ICD-10-CM | POA: Diagnosis not present

## 2019-11-10 DIAGNOSIS — G309 Alzheimer's disease, unspecified: Secondary | ICD-10-CM | POA: Diagnosis not present

## 2019-11-10 DIAGNOSIS — F0150 Vascular dementia without behavioral disturbance: Secondary | ICD-10-CM | POA: Diagnosis not present

## 2019-11-13 DIAGNOSIS — I69318 Other symptoms and signs involving cognitive functions following cerebral infarction: Secondary | ICD-10-CM | POA: Diagnosis not present

## 2019-11-13 DIAGNOSIS — F0280 Dementia in other diseases classified elsewhere without behavioral disturbance: Secondary | ICD-10-CM | POA: Diagnosis not present

## 2019-11-13 DIAGNOSIS — G309 Alzheimer's disease, unspecified: Secondary | ICD-10-CM | POA: Diagnosis not present

## 2019-11-13 DIAGNOSIS — I350 Nonrheumatic aortic (valve) stenosis: Secondary | ICD-10-CM | POA: Diagnosis not present

## 2019-11-13 DIAGNOSIS — I739 Peripheral vascular disease, unspecified: Secondary | ICD-10-CM | POA: Diagnosis not present

## 2019-11-13 DIAGNOSIS — F0150 Vascular dementia without behavioral disturbance: Secondary | ICD-10-CM | POA: Diagnosis not present

## 2019-11-16 DIAGNOSIS — F0150 Vascular dementia without behavioral disturbance: Secondary | ICD-10-CM | POA: Diagnosis not present

## 2019-11-16 DIAGNOSIS — F329 Major depressive disorder, single episode, unspecified: Secondary | ICD-10-CM | POA: Diagnosis not present

## 2019-11-16 DIAGNOSIS — I350 Nonrheumatic aortic (valve) stenosis: Secondary | ICD-10-CM | POA: Diagnosis not present

## 2019-11-16 DIAGNOSIS — E785 Hyperlipidemia, unspecified: Secondary | ICD-10-CM | POA: Diagnosis not present

## 2019-11-16 DIAGNOSIS — I69318 Other symptoms and signs involving cognitive functions following cerebral infarction: Secondary | ICD-10-CM | POA: Diagnosis not present

## 2019-11-16 DIAGNOSIS — F0280 Dementia in other diseases classified elsewhere without behavioral disturbance: Secondary | ICD-10-CM | POA: Diagnosis not present

## 2019-11-16 DIAGNOSIS — I482 Chronic atrial fibrillation, unspecified: Secondary | ICD-10-CM | POA: Diagnosis not present

## 2019-11-16 DIAGNOSIS — G309 Alzheimer's disease, unspecified: Secondary | ICD-10-CM | POA: Diagnosis not present

## 2019-11-16 DIAGNOSIS — M810 Age-related osteoporosis without current pathological fracture: Secondary | ICD-10-CM | POA: Diagnosis not present

## 2019-11-16 DIAGNOSIS — Z87310 Personal history of (healed) osteoporosis fracture: Secondary | ICD-10-CM | POA: Diagnosis not present

## 2019-11-16 DIAGNOSIS — G35 Multiple sclerosis: Secondary | ICD-10-CM | POA: Diagnosis not present

## 2019-11-16 DIAGNOSIS — I10 Essential (primary) hypertension: Secondary | ICD-10-CM | POA: Diagnosis not present

## 2019-11-16 DIAGNOSIS — Z8639 Personal history of other endocrine, nutritional and metabolic disease: Secondary | ICD-10-CM | POA: Diagnosis not present

## 2019-11-16 DIAGNOSIS — Z741 Need for assistance with personal care: Secondary | ICD-10-CM | POA: Diagnosis not present

## 2019-11-16 DIAGNOSIS — I739 Peripheral vascular disease, unspecified: Secondary | ICD-10-CM | POA: Diagnosis not present

## 2019-11-16 DIAGNOSIS — E039 Hypothyroidism, unspecified: Secondary | ICD-10-CM | POA: Diagnosis not present

## 2019-11-16 DIAGNOSIS — Z6827 Body mass index (BMI) 27.0-27.9, adult: Secondary | ICD-10-CM | POA: Diagnosis not present

## 2019-11-22 DIAGNOSIS — I350 Nonrheumatic aortic (valve) stenosis: Secondary | ICD-10-CM | POA: Diagnosis not present

## 2019-11-22 DIAGNOSIS — G309 Alzheimer's disease, unspecified: Secondary | ICD-10-CM | POA: Diagnosis not present

## 2019-11-22 DIAGNOSIS — I739 Peripheral vascular disease, unspecified: Secondary | ICD-10-CM | POA: Diagnosis not present

## 2019-11-22 DIAGNOSIS — F0280 Dementia in other diseases classified elsewhere without behavioral disturbance: Secondary | ICD-10-CM | POA: Diagnosis not present

## 2019-11-22 DIAGNOSIS — F0150 Vascular dementia without behavioral disturbance: Secondary | ICD-10-CM | POA: Diagnosis not present

## 2019-11-22 DIAGNOSIS — I69318 Other symptoms and signs involving cognitive functions following cerebral infarction: Secondary | ICD-10-CM | POA: Diagnosis not present

## 2019-11-23 DIAGNOSIS — I350 Nonrheumatic aortic (valve) stenosis: Secondary | ICD-10-CM | POA: Diagnosis not present

## 2019-11-23 DIAGNOSIS — F0280 Dementia in other diseases classified elsewhere without behavioral disturbance: Secondary | ICD-10-CM | POA: Diagnosis not present

## 2019-11-23 DIAGNOSIS — F0150 Vascular dementia without behavioral disturbance: Secondary | ICD-10-CM | POA: Diagnosis not present

## 2019-11-23 DIAGNOSIS — I739 Peripheral vascular disease, unspecified: Secondary | ICD-10-CM | POA: Diagnosis not present

## 2019-11-23 DIAGNOSIS — I69318 Other symptoms and signs involving cognitive functions following cerebral infarction: Secondary | ICD-10-CM | POA: Diagnosis not present

## 2019-11-23 DIAGNOSIS — G309 Alzheimer's disease, unspecified: Secondary | ICD-10-CM | POA: Diagnosis not present

## 2019-11-24 DIAGNOSIS — I739 Peripheral vascular disease, unspecified: Secondary | ICD-10-CM | POA: Diagnosis not present

## 2019-11-24 DIAGNOSIS — I350 Nonrheumatic aortic (valve) stenosis: Secondary | ICD-10-CM | POA: Diagnosis not present

## 2019-11-24 DIAGNOSIS — I69318 Other symptoms and signs involving cognitive functions following cerebral infarction: Secondary | ICD-10-CM | POA: Diagnosis not present

## 2019-11-24 DIAGNOSIS — F0150 Vascular dementia without behavioral disturbance: Secondary | ICD-10-CM | POA: Diagnosis not present

## 2019-11-24 DIAGNOSIS — F0280 Dementia in other diseases classified elsewhere without behavioral disturbance: Secondary | ICD-10-CM | POA: Diagnosis not present

## 2019-11-24 DIAGNOSIS — G309 Alzheimer's disease, unspecified: Secondary | ICD-10-CM | POA: Diagnosis not present

## 2019-11-27 DIAGNOSIS — F0280 Dementia in other diseases classified elsewhere without behavioral disturbance: Secondary | ICD-10-CM | POA: Diagnosis not present

## 2019-11-27 DIAGNOSIS — I69318 Other symptoms and signs involving cognitive functions following cerebral infarction: Secondary | ICD-10-CM | POA: Diagnosis not present

## 2019-11-27 DIAGNOSIS — I350 Nonrheumatic aortic (valve) stenosis: Secondary | ICD-10-CM | POA: Diagnosis not present

## 2019-11-27 DIAGNOSIS — G309 Alzheimer's disease, unspecified: Secondary | ICD-10-CM | POA: Diagnosis not present

## 2019-11-27 DIAGNOSIS — F0150 Vascular dementia without behavioral disturbance: Secondary | ICD-10-CM | POA: Diagnosis not present

## 2019-11-27 DIAGNOSIS — I739 Peripheral vascular disease, unspecified: Secondary | ICD-10-CM | POA: Diagnosis not present

## 2019-12-01 DIAGNOSIS — I350 Nonrheumatic aortic (valve) stenosis: Secondary | ICD-10-CM | POA: Diagnosis not present

## 2019-12-01 DIAGNOSIS — F0150 Vascular dementia without behavioral disturbance: Secondary | ICD-10-CM | POA: Diagnosis not present

## 2019-12-01 DIAGNOSIS — G309 Alzheimer's disease, unspecified: Secondary | ICD-10-CM | POA: Diagnosis not present

## 2019-12-01 DIAGNOSIS — F0280 Dementia in other diseases classified elsewhere without behavioral disturbance: Secondary | ICD-10-CM | POA: Diagnosis not present

## 2019-12-01 DIAGNOSIS — I739 Peripheral vascular disease, unspecified: Secondary | ICD-10-CM | POA: Diagnosis not present

## 2019-12-01 DIAGNOSIS — I69318 Other symptoms and signs involving cognitive functions following cerebral infarction: Secondary | ICD-10-CM | POA: Diagnosis not present

## 2019-12-04 DIAGNOSIS — I350 Nonrheumatic aortic (valve) stenosis: Secondary | ICD-10-CM | POA: Diagnosis not present

## 2019-12-04 DIAGNOSIS — F0280 Dementia in other diseases classified elsewhere without behavioral disturbance: Secondary | ICD-10-CM | POA: Diagnosis not present

## 2019-12-04 DIAGNOSIS — G309 Alzheimer's disease, unspecified: Secondary | ICD-10-CM | POA: Diagnosis not present

## 2019-12-04 DIAGNOSIS — I739 Peripheral vascular disease, unspecified: Secondary | ICD-10-CM | POA: Diagnosis not present

## 2019-12-04 DIAGNOSIS — F0150 Vascular dementia without behavioral disturbance: Secondary | ICD-10-CM | POA: Diagnosis not present

## 2019-12-04 DIAGNOSIS — I69318 Other symptoms and signs involving cognitive functions following cerebral infarction: Secondary | ICD-10-CM | POA: Diagnosis not present

## 2019-12-07 DIAGNOSIS — F0150 Vascular dementia without behavioral disturbance: Secondary | ICD-10-CM | POA: Diagnosis not present

## 2019-12-07 DIAGNOSIS — I350 Nonrheumatic aortic (valve) stenosis: Secondary | ICD-10-CM | POA: Diagnosis not present

## 2019-12-07 DIAGNOSIS — F0280 Dementia in other diseases classified elsewhere without behavioral disturbance: Secondary | ICD-10-CM | POA: Diagnosis not present

## 2019-12-07 DIAGNOSIS — G309 Alzheimer's disease, unspecified: Secondary | ICD-10-CM | POA: Diagnosis not present

## 2019-12-07 DIAGNOSIS — I69318 Other symptoms and signs involving cognitive functions following cerebral infarction: Secondary | ICD-10-CM | POA: Diagnosis not present

## 2019-12-07 DIAGNOSIS — I739 Peripheral vascular disease, unspecified: Secondary | ICD-10-CM | POA: Diagnosis not present

## 2019-12-08 DIAGNOSIS — I739 Peripheral vascular disease, unspecified: Secondary | ICD-10-CM | POA: Diagnosis not present

## 2019-12-08 DIAGNOSIS — F0150 Vascular dementia without behavioral disturbance: Secondary | ICD-10-CM | POA: Diagnosis not present

## 2019-12-08 DIAGNOSIS — I350 Nonrheumatic aortic (valve) stenosis: Secondary | ICD-10-CM | POA: Diagnosis not present

## 2019-12-08 DIAGNOSIS — G309 Alzheimer's disease, unspecified: Secondary | ICD-10-CM | POA: Diagnosis not present

## 2019-12-08 DIAGNOSIS — I69318 Other symptoms and signs involving cognitive functions following cerebral infarction: Secondary | ICD-10-CM | POA: Diagnosis not present

## 2019-12-08 DIAGNOSIS — F0280 Dementia in other diseases classified elsewhere without behavioral disturbance: Secondary | ICD-10-CM | POA: Diagnosis not present

## 2019-12-11 DIAGNOSIS — I739 Peripheral vascular disease, unspecified: Secondary | ICD-10-CM | POA: Diagnosis not present

## 2019-12-11 DIAGNOSIS — I69318 Other symptoms and signs involving cognitive functions following cerebral infarction: Secondary | ICD-10-CM | POA: Diagnosis not present

## 2019-12-11 DIAGNOSIS — F0280 Dementia in other diseases classified elsewhere without behavioral disturbance: Secondary | ICD-10-CM | POA: Diagnosis not present

## 2019-12-11 DIAGNOSIS — I350 Nonrheumatic aortic (valve) stenosis: Secondary | ICD-10-CM | POA: Diagnosis not present

## 2019-12-11 DIAGNOSIS — G309 Alzheimer's disease, unspecified: Secondary | ICD-10-CM | POA: Diagnosis not present

## 2019-12-11 DIAGNOSIS — F0150 Vascular dementia without behavioral disturbance: Secondary | ICD-10-CM | POA: Diagnosis not present

## 2019-12-13 DIAGNOSIS — I69318 Other symptoms and signs involving cognitive functions following cerebral infarction: Secondary | ICD-10-CM | POA: Diagnosis not present

## 2019-12-13 DIAGNOSIS — G309 Alzheimer's disease, unspecified: Secondary | ICD-10-CM | POA: Diagnosis not present

## 2019-12-13 DIAGNOSIS — I739 Peripheral vascular disease, unspecified: Secondary | ICD-10-CM | POA: Diagnosis not present

## 2019-12-13 DIAGNOSIS — F0150 Vascular dementia without behavioral disturbance: Secondary | ICD-10-CM | POA: Diagnosis not present

## 2019-12-13 DIAGNOSIS — I350 Nonrheumatic aortic (valve) stenosis: Secondary | ICD-10-CM | POA: Diagnosis not present

## 2019-12-13 DIAGNOSIS — F0280 Dementia in other diseases classified elsewhere without behavioral disturbance: Secondary | ICD-10-CM | POA: Diagnosis not present

## 2019-12-15 DIAGNOSIS — I739 Peripheral vascular disease, unspecified: Secondary | ICD-10-CM | POA: Diagnosis not present

## 2019-12-15 DIAGNOSIS — G309 Alzheimer's disease, unspecified: Secondary | ICD-10-CM | POA: Diagnosis not present

## 2019-12-15 DIAGNOSIS — F0280 Dementia in other diseases classified elsewhere without behavioral disturbance: Secondary | ICD-10-CM | POA: Diagnosis not present

## 2019-12-15 DIAGNOSIS — I69318 Other symptoms and signs involving cognitive functions following cerebral infarction: Secondary | ICD-10-CM | POA: Diagnosis not present

## 2019-12-15 DIAGNOSIS — I350 Nonrheumatic aortic (valve) stenosis: Secondary | ICD-10-CM | POA: Diagnosis not present

## 2019-12-15 DIAGNOSIS — F0150 Vascular dementia without behavioral disturbance: Secondary | ICD-10-CM | POA: Diagnosis not present

## 2019-12-16 DIAGNOSIS — I350 Nonrheumatic aortic (valve) stenosis: Secondary | ICD-10-CM | POA: Diagnosis not present

## 2019-12-16 DIAGNOSIS — Z6827 Body mass index (BMI) 27.0-27.9, adult: Secondary | ICD-10-CM | POA: Diagnosis not present

## 2019-12-16 DIAGNOSIS — I10 Essential (primary) hypertension: Secondary | ICD-10-CM | POA: Diagnosis not present

## 2019-12-16 DIAGNOSIS — E785 Hyperlipidemia, unspecified: Secondary | ICD-10-CM | POA: Diagnosis not present

## 2019-12-16 DIAGNOSIS — F0280 Dementia in other diseases classified elsewhere without behavioral disturbance: Secondary | ICD-10-CM | POA: Diagnosis not present

## 2019-12-16 DIAGNOSIS — E039 Hypothyroidism, unspecified: Secondary | ICD-10-CM | POA: Diagnosis not present

## 2019-12-16 DIAGNOSIS — I69318 Other symptoms and signs involving cognitive functions following cerebral infarction: Secondary | ICD-10-CM | POA: Diagnosis not present

## 2019-12-16 DIAGNOSIS — G35 Multiple sclerosis: Secondary | ICD-10-CM | POA: Diagnosis not present

## 2019-12-16 DIAGNOSIS — G309 Alzheimer's disease, unspecified: Secondary | ICD-10-CM | POA: Diagnosis not present

## 2019-12-16 DIAGNOSIS — Z741 Need for assistance with personal care: Secondary | ICD-10-CM | POA: Diagnosis not present

## 2019-12-16 DIAGNOSIS — F329 Major depressive disorder, single episode, unspecified: Secondary | ICD-10-CM | POA: Diagnosis not present

## 2019-12-16 DIAGNOSIS — F0150 Vascular dementia without behavioral disturbance: Secondary | ICD-10-CM | POA: Diagnosis not present

## 2019-12-16 DIAGNOSIS — Z87310 Personal history of (healed) osteoporosis fracture: Secondary | ICD-10-CM | POA: Diagnosis not present

## 2019-12-16 DIAGNOSIS — I739 Peripheral vascular disease, unspecified: Secondary | ICD-10-CM | POA: Diagnosis not present

## 2019-12-16 DIAGNOSIS — Z8639 Personal history of other endocrine, nutritional and metabolic disease: Secondary | ICD-10-CM | POA: Diagnosis not present

## 2019-12-16 DIAGNOSIS — I482 Chronic atrial fibrillation, unspecified: Secondary | ICD-10-CM | POA: Diagnosis not present

## 2019-12-16 DIAGNOSIS — M810 Age-related osteoporosis without current pathological fracture: Secondary | ICD-10-CM | POA: Diagnosis not present

## 2019-12-19 DIAGNOSIS — F0150 Vascular dementia without behavioral disturbance: Secondary | ICD-10-CM | POA: Diagnosis not present

## 2019-12-19 DIAGNOSIS — I350 Nonrheumatic aortic (valve) stenosis: Secondary | ICD-10-CM | POA: Diagnosis not present

## 2019-12-19 DIAGNOSIS — F0280 Dementia in other diseases classified elsewhere without behavioral disturbance: Secondary | ICD-10-CM | POA: Diagnosis not present

## 2019-12-19 DIAGNOSIS — G309 Alzheimer's disease, unspecified: Secondary | ICD-10-CM | POA: Diagnosis not present

## 2019-12-19 DIAGNOSIS — I69318 Other symptoms and signs involving cognitive functions following cerebral infarction: Secondary | ICD-10-CM | POA: Diagnosis not present

## 2019-12-19 DIAGNOSIS — I739 Peripheral vascular disease, unspecified: Secondary | ICD-10-CM | POA: Diagnosis not present

## 2019-12-21 DIAGNOSIS — I350 Nonrheumatic aortic (valve) stenosis: Secondary | ICD-10-CM | POA: Diagnosis not present

## 2019-12-21 DIAGNOSIS — F0280 Dementia in other diseases classified elsewhere without behavioral disturbance: Secondary | ICD-10-CM | POA: Diagnosis not present

## 2019-12-21 DIAGNOSIS — G309 Alzheimer's disease, unspecified: Secondary | ICD-10-CM | POA: Diagnosis not present

## 2019-12-21 DIAGNOSIS — I739 Peripheral vascular disease, unspecified: Secondary | ICD-10-CM | POA: Diagnosis not present

## 2019-12-21 DIAGNOSIS — I69318 Other symptoms and signs involving cognitive functions following cerebral infarction: Secondary | ICD-10-CM | POA: Diagnosis not present

## 2019-12-21 DIAGNOSIS — F0150 Vascular dementia without behavioral disturbance: Secondary | ICD-10-CM | POA: Diagnosis not present

## 2019-12-22 DIAGNOSIS — I69318 Other symptoms and signs involving cognitive functions following cerebral infarction: Secondary | ICD-10-CM | POA: Diagnosis not present

## 2019-12-22 DIAGNOSIS — F0280 Dementia in other diseases classified elsewhere without behavioral disturbance: Secondary | ICD-10-CM | POA: Diagnosis not present

## 2019-12-22 DIAGNOSIS — F0150 Vascular dementia without behavioral disturbance: Secondary | ICD-10-CM | POA: Diagnosis not present

## 2019-12-22 DIAGNOSIS — I739 Peripheral vascular disease, unspecified: Secondary | ICD-10-CM | POA: Diagnosis not present

## 2019-12-22 DIAGNOSIS — I350 Nonrheumatic aortic (valve) stenosis: Secondary | ICD-10-CM | POA: Diagnosis not present

## 2019-12-22 DIAGNOSIS — G309 Alzheimer's disease, unspecified: Secondary | ICD-10-CM | POA: Diagnosis not present

## 2019-12-25 DIAGNOSIS — I69318 Other symptoms and signs involving cognitive functions following cerebral infarction: Secondary | ICD-10-CM | POA: Diagnosis not present

## 2019-12-25 DIAGNOSIS — G309 Alzheimer's disease, unspecified: Secondary | ICD-10-CM | POA: Diagnosis not present

## 2019-12-25 DIAGNOSIS — F0280 Dementia in other diseases classified elsewhere without behavioral disturbance: Secondary | ICD-10-CM | POA: Diagnosis not present

## 2019-12-25 DIAGNOSIS — I350 Nonrheumatic aortic (valve) stenosis: Secondary | ICD-10-CM | POA: Diagnosis not present

## 2019-12-25 DIAGNOSIS — F0150 Vascular dementia without behavioral disturbance: Secondary | ICD-10-CM | POA: Diagnosis not present

## 2019-12-25 DIAGNOSIS — I739 Peripheral vascular disease, unspecified: Secondary | ICD-10-CM | POA: Diagnosis not present

## 2019-12-27 DIAGNOSIS — G309 Alzheimer's disease, unspecified: Secondary | ICD-10-CM | POA: Diagnosis not present

## 2019-12-27 DIAGNOSIS — F0280 Dementia in other diseases classified elsewhere without behavioral disturbance: Secondary | ICD-10-CM | POA: Diagnosis not present

## 2019-12-27 DIAGNOSIS — I69318 Other symptoms and signs involving cognitive functions following cerebral infarction: Secondary | ICD-10-CM | POA: Diagnosis not present

## 2019-12-27 DIAGNOSIS — I350 Nonrheumatic aortic (valve) stenosis: Secondary | ICD-10-CM | POA: Diagnosis not present

## 2019-12-27 DIAGNOSIS — I739 Peripheral vascular disease, unspecified: Secondary | ICD-10-CM | POA: Diagnosis not present

## 2019-12-27 DIAGNOSIS — F0150 Vascular dementia without behavioral disturbance: Secondary | ICD-10-CM | POA: Diagnosis not present

## 2019-12-29 DIAGNOSIS — I350 Nonrheumatic aortic (valve) stenosis: Secondary | ICD-10-CM | POA: Diagnosis not present

## 2019-12-29 DIAGNOSIS — I69318 Other symptoms and signs involving cognitive functions following cerebral infarction: Secondary | ICD-10-CM | POA: Diagnosis not present

## 2019-12-29 DIAGNOSIS — G309 Alzheimer's disease, unspecified: Secondary | ICD-10-CM | POA: Diagnosis not present

## 2019-12-29 DIAGNOSIS — I739 Peripheral vascular disease, unspecified: Secondary | ICD-10-CM | POA: Diagnosis not present

## 2019-12-29 DIAGNOSIS — F0280 Dementia in other diseases classified elsewhere without behavioral disturbance: Secondary | ICD-10-CM | POA: Diagnosis not present

## 2019-12-29 DIAGNOSIS — F0150 Vascular dementia without behavioral disturbance: Secondary | ICD-10-CM | POA: Diagnosis not present

## 2019-12-30 DIAGNOSIS — I739 Peripheral vascular disease, unspecified: Secondary | ICD-10-CM | POA: Diagnosis not present

## 2019-12-30 DIAGNOSIS — F0150 Vascular dementia without behavioral disturbance: Secondary | ICD-10-CM | POA: Diagnosis not present

## 2019-12-30 DIAGNOSIS — G309 Alzheimer's disease, unspecified: Secondary | ICD-10-CM | POA: Diagnosis not present

## 2019-12-30 DIAGNOSIS — I350 Nonrheumatic aortic (valve) stenosis: Secondary | ICD-10-CM | POA: Diagnosis not present

## 2019-12-30 DIAGNOSIS — F0280 Dementia in other diseases classified elsewhere without behavioral disturbance: Secondary | ICD-10-CM | POA: Diagnosis not present

## 2019-12-30 DIAGNOSIS — I69318 Other symptoms and signs involving cognitive functions following cerebral infarction: Secondary | ICD-10-CM | POA: Diagnosis not present

## 2020-01-01 DIAGNOSIS — I69318 Other symptoms and signs involving cognitive functions following cerebral infarction: Secondary | ICD-10-CM | POA: Diagnosis not present

## 2020-01-01 DIAGNOSIS — I350 Nonrheumatic aortic (valve) stenosis: Secondary | ICD-10-CM | POA: Diagnosis not present

## 2020-01-01 DIAGNOSIS — F0280 Dementia in other diseases classified elsewhere without behavioral disturbance: Secondary | ICD-10-CM | POA: Diagnosis not present

## 2020-01-01 DIAGNOSIS — G309 Alzheimer's disease, unspecified: Secondary | ICD-10-CM | POA: Diagnosis not present

## 2020-01-01 DIAGNOSIS — I739 Peripheral vascular disease, unspecified: Secondary | ICD-10-CM | POA: Diagnosis not present

## 2020-01-01 DIAGNOSIS — F0150 Vascular dementia without behavioral disturbance: Secondary | ICD-10-CM | POA: Diagnosis not present

## 2020-01-02 DIAGNOSIS — I739 Peripheral vascular disease, unspecified: Secondary | ICD-10-CM | POA: Diagnosis not present

## 2020-01-02 DIAGNOSIS — G309 Alzheimer's disease, unspecified: Secondary | ICD-10-CM | POA: Diagnosis not present

## 2020-01-02 DIAGNOSIS — I350 Nonrheumatic aortic (valve) stenosis: Secondary | ICD-10-CM | POA: Diagnosis not present

## 2020-01-02 DIAGNOSIS — I69318 Other symptoms and signs involving cognitive functions following cerebral infarction: Secondary | ICD-10-CM | POA: Diagnosis not present

## 2020-01-02 DIAGNOSIS — F0280 Dementia in other diseases classified elsewhere without behavioral disturbance: Secondary | ICD-10-CM | POA: Diagnosis not present

## 2020-01-02 DIAGNOSIS — F0150 Vascular dementia without behavioral disturbance: Secondary | ICD-10-CM | POA: Diagnosis not present

## 2020-01-10 DIAGNOSIS — I739 Peripheral vascular disease, unspecified: Secondary | ICD-10-CM | POA: Diagnosis not present

## 2020-01-10 DIAGNOSIS — I350 Nonrheumatic aortic (valve) stenosis: Secondary | ICD-10-CM | POA: Diagnosis not present

## 2020-01-10 DIAGNOSIS — F0280 Dementia in other diseases classified elsewhere without behavioral disturbance: Secondary | ICD-10-CM | POA: Diagnosis not present

## 2020-01-10 DIAGNOSIS — G309 Alzheimer's disease, unspecified: Secondary | ICD-10-CM | POA: Diagnosis not present

## 2020-01-10 DIAGNOSIS — F0150 Vascular dementia without behavioral disturbance: Secondary | ICD-10-CM | POA: Diagnosis not present

## 2020-01-10 DIAGNOSIS — I69318 Other symptoms and signs involving cognitive functions following cerebral infarction: Secondary | ICD-10-CM | POA: Diagnosis not present

## 2020-01-16 DIAGNOSIS — I10 Essential (primary) hypertension: Secondary | ICD-10-CM | POA: Diagnosis not present

## 2020-01-16 DIAGNOSIS — F0150 Vascular dementia without behavioral disturbance: Secondary | ICD-10-CM | POA: Diagnosis not present

## 2020-01-16 DIAGNOSIS — I69318 Other symptoms and signs involving cognitive functions following cerebral infarction: Secondary | ICD-10-CM | POA: Diagnosis not present

## 2020-01-16 DIAGNOSIS — F0280 Dementia in other diseases classified elsewhere without behavioral disturbance: Secondary | ICD-10-CM | POA: Diagnosis not present

## 2020-01-16 DIAGNOSIS — F329 Major depressive disorder, single episode, unspecified: Secondary | ICD-10-CM | POA: Diagnosis not present

## 2020-01-16 DIAGNOSIS — E785 Hyperlipidemia, unspecified: Secondary | ICD-10-CM | POA: Diagnosis not present

## 2020-01-16 DIAGNOSIS — I739 Peripheral vascular disease, unspecified: Secondary | ICD-10-CM | POA: Diagnosis not present

## 2020-01-16 DIAGNOSIS — I350 Nonrheumatic aortic (valve) stenosis: Secondary | ICD-10-CM | POA: Diagnosis not present

## 2020-01-16 DIAGNOSIS — I482 Chronic atrial fibrillation, unspecified: Secondary | ICD-10-CM | POA: Diagnosis not present

## 2020-01-16 DIAGNOSIS — Z741 Need for assistance with personal care: Secondary | ICD-10-CM | POA: Diagnosis not present

## 2020-01-16 DIAGNOSIS — G309 Alzheimer's disease, unspecified: Secondary | ICD-10-CM | POA: Diagnosis not present

## 2020-01-16 DIAGNOSIS — M810 Age-related osteoporosis without current pathological fracture: Secondary | ICD-10-CM | POA: Diagnosis not present

## 2020-01-16 DIAGNOSIS — E039 Hypothyroidism, unspecified: Secondary | ICD-10-CM | POA: Diagnosis not present

## 2020-01-16 DIAGNOSIS — Z87310 Personal history of (healed) osteoporosis fracture: Secondary | ICD-10-CM | POA: Diagnosis not present

## 2020-01-16 DIAGNOSIS — Z6827 Body mass index (BMI) 27.0-27.9, adult: Secondary | ICD-10-CM | POA: Diagnosis not present

## 2020-01-16 DIAGNOSIS — Z8639 Personal history of other endocrine, nutritional and metabolic disease: Secondary | ICD-10-CM | POA: Diagnosis not present

## 2020-01-16 DIAGNOSIS — G35 Multiple sclerosis: Secondary | ICD-10-CM | POA: Diagnosis not present

## 2020-01-19 DIAGNOSIS — G309 Alzheimer's disease, unspecified: Secondary | ICD-10-CM | POA: Diagnosis not present

## 2020-01-19 DIAGNOSIS — F0280 Dementia in other diseases classified elsewhere without behavioral disturbance: Secondary | ICD-10-CM | POA: Diagnosis not present

## 2020-01-19 DIAGNOSIS — I739 Peripheral vascular disease, unspecified: Secondary | ICD-10-CM | POA: Diagnosis not present

## 2020-01-19 DIAGNOSIS — F0150 Vascular dementia without behavioral disturbance: Secondary | ICD-10-CM | POA: Diagnosis not present

## 2020-01-19 DIAGNOSIS — I350 Nonrheumatic aortic (valve) stenosis: Secondary | ICD-10-CM | POA: Diagnosis not present

## 2020-01-19 DIAGNOSIS — I69318 Other symptoms and signs involving cognitive functions following cerebral infarction: Secondary | ICD-10-CM | POA: Diagnosis not present

## 2020-01-22 DIAGNOSIS — G309 Alzheimer's disease, unspecified: Secondary | ICD-10-CM | POA: Diagnosis not present

## 2020-01-22 DIAGNOSIS — I69318 Other symptoms and signs involving cognitive functions following cerebral infarction: Secondary | ICD-10-CM | POA: Diagnosis not present

## 2020-01-22 DIAGNOSIS — I350 Nonrheumatic aortic (valve) stenosis: Secondary | ICD-10-CM | POA: Diagnosis not present

## 2020-01-22 DIAGNOSIS — F0280 Dementia in other diseases classified elsewhere without behavioral disturbance: Secondary | ICD-10-CM | POA: Diagnosis not present

## 2020-01-22 DIAGNOSIS — F0150 Vascular dementia without behavioral disturbance: Secondary | ICD-10-CM | POA: Diagnosis not present

## 2020-01-22 DIAGNOSIS — I739 Peripheral vascular disease, unspecified: Secondary | ICD-10-CM | POA: Diagnosis not present

## 2020-01-24 DIAGNOSIS — G309 Alzheimer's disease, unspecified: Secondary | ICD-10-CM | POA: Diagnosis not present

## 2020-01-24 DIAGNOSIS — I739 Peripheral vascular disease, unspecified: Secondary | ICD-10-CM | POA: Diagnosis not present

## 2020-01-24 DIAGNOSIS — F0280 Dementia in other diseases classified elsewhere without behavioral disturbance: Secondary | ICD-10-CM | POA: Diagnosis not present

## 2020-01-24 DIAGNOSIS — F0150 Vascular dementia without behavioral disturbance: Secondary | ICD-10-CM | POA: Diagnosis not present

## 2020-01-24 DIAGNOSIS — I350 Nonrheumatic aortic (valve) stenosis: Secondary | ICD-10-CM | POA: Diagnosis not present

## 2020-01-24 DIAGNOSIS — I69318 Other symptoms and signs involving cognitive functions following cerebral infarction: Secondary | ICD-10-CM | POA: Diagnosis not present

## 2020-01-26 DIAGNOSIS — I69318 Other symptoms and signs involving cognitive functions following cerebral infarction: Secondary | ICD-10-CM | POA: Diagnosis not present

## 2020-01-26 DIAGNOSIS — I739 Peripheral vascular disease, unspecified: Secondary | ICD-10-CM | POA: Diagnosis not present

## 2020-01-26 DIAGNOSIS — G309 Alzheimer's disease, unspecified: Secondary | ICD-10-CM | POA: Diagnosis not present

## 2020-01-26 DIAGNOSIS — I350 Nonrheumatic aortic (valve) stenosis: Secondary | ICD-10-CM | POA: Diagnosis not present

## 2020-01-26 DIAGNOSIS — F0150 Vascular dementia without behavioral disturbance: Secondary | ICD-10-CM | POA: Diagnosis not present

## 2020-01-26 DIAGNOSIS — F0280 Dementia in other diseases classified elsewhere without behavioral disturbance: Secondary | ICD-10-CM | POA: Diagnosis not present

## 2020-01-29 DIAGNOSIS — I739 Peripheral vascular disease, unspecified: Secondary | ICD-10-CM | POA: Diagnosis not present

## 2020-01-29 DIAGNOSIS — F0280 Dementia in other diseases classified elsewhere without behavioral disturbance: Secondary | ICD-10-CM | POA: Diagnosis not present

## 2020-01-29 DIAGNOSIS — I69318 Other symptoms and signs involving cognitive functions following cerebral infarction: Secondary | ICD-10-CM | POA: Diagnosis not present

## 2020-01-29 DIAGNOSIS — F0150 Vascular dementia without behavioral disturbance: Secondary | ICD-10-CM | POA: Diagnosis not present

## 2020-01-29 DIAGNOSIS — I350 Nonrheumatic aortic (valve) stenosis: Secondary | ICD-10-CM | POA: Diagnosis not present

## 2020-01-29 DIAGNOSIS — G309 Alzheimer's disease, unspecified: Secondary | ICD-10-CM | POA: Diagnosis not present

## 2020-01-30 DIAGNOSIS — F0280 Dementia in other diseases classified elsewhere without behavioral disturbance: Secondary | ICD-10-CM | POA: Diagnosis not present

## 2020-01-30 DIAGNOSIS — I739 Peripheral vascular disease, unspecified: Secondary | ICD-10-CM | POA: Diagnosis not present

## 2020-01-30 DIAGNOSIS — G309 Alzheimer's disease, unspecified: Secondary | ICD-10-CM | POA: Diagnosis not present

## 2020-01-30 DIAGNOSIS — F0150 Vascular dementia without behavioral disturbance: Secondary | ICD-10-CM | POA: Diagnosis not present

## 2020-01-30 DIAGNOSIS — I69318 Other symptoms and signs involving cognitive functions following cerebral infarction: Secondary | ICD-10-CM | POA: Diagnosis not present

## 2020-01-30 DIAGNOSIS — I350 Nonrheumatic aortic (valve) stenosis: Secondary | ICD-10-CM | POA: Diagnosis not present

## 2020-01-31 DIAGNOSIS — N390 Urinary tract infection, site not specified: Secondary | ICD-10-CM | POA: Diagnosis not present

## 2020-02-01 DIAGNOSIS — F0150 Vascular dementia without behavioral disturbance: Secondary | ICD-10-CM | POA: Diagnosis not present

## 2020-02-01 DIAGNOSIS — I350 Nonrheumatic aortic (valve) stenosis: Secondary | ICD-10-CM | POA: Diagnosis not present

## 2020-02-01 DIAGNOSIS — F0280 Dementia in other diseases classified elsewhere without behavioral disturbance: Secondary | ICD-10-CM | POA: Diagnosis not present

## 2020-02-01 DIAGNOSIS — G309 Alzheimer's disease, unspecified: Secondary | ICD-10-CM | POA: Diagnosis not present

## 2020-02-01 DIAGNOSIS — I739 Peripheral vascular disease, unspecified: Secondary | ICD-10-CM | POA: Diagnosis not present

## 2020-02-01 DIAGNOSIS — I69318 Other symptoms and signs involving cognitive functions following cerebral infarction: Secondary | ICD-10-CM | POA: Diagnosis not present

## 2020-02-02 DIAGNOSIS — I69318 Other symptoms and signs involving cognitive functions following cerebral infarction: Secondary | ICD-10-CM | POA: Diagnosis not present

## 2020-02-02 DIAGNOSIS — F0280 Dementia in other diseases classified elsewhere without behavioral disturbance: Secondary | ICD-10-CM | POA: Diagnosis not present

## 2020-02-02 DIAGNOSIS — I350 Nonrheumatic aortic (valve) stenosis: Secondary | ICD-10-CM | POA: Diagnosis not present

## 2020-02-02 DIAGNOSIS — F0150 Vascular dementia without behavioral disturbance: Secondary | ICD-10-CM | POA: Diagnosis not present

## 2020-02-02 DIAGNOSIS — G309 Alzheimer's disease, unspecified: Secondary | ICD-10-CM | POA: Diagnosis not present

## 2020-02-02 DIAGNOSIS — I739 Peripheral vascular disease, unspecified: Secondary | ICD-10-CM | POA: Diagnosis not present

## 2020-02-05 DIAGNOSIS — I69318 Other symptoms and signs involving cognitive functions following cerebral infarction: Secondary | ICD-10-CM | POA: Diagnosis not present

## 2020-02-05 DIAGNOSIS — I739 Peripheral vascular disease, unspecified: Secondary | ICD-10-CM | POA: Diagnosis not present

## 2020-02-05 DIAGNOSIS — I350 Nonrheumatic aortic (valve) stenosis: Secondary | ICD-10-CM | POA: Diagnosis not present

## 2020-02-05 DIAGNOSIS — G309 Alzheimer's disease, unspecified: Secondary | ICD-10-CM | POA: Diagnosis not present

## 2020-02-05 DIAGNOSIS — F0280 Dementia in other diseases classified elsewhere without behavioral disturbance: Secondary | ICD-10-CM | POA: Diagnosis not present

## 2020-02-05 DIAGNOSIS — F0150 Vascular dementia without behavioral disturbance: Secondary | ICD-10-CM | POA: Diagnosis not present

## 2020-02-07 DIAGNOSIS — F0280 Dementia in other diseases classified elsewhere without behavioral disturbance: Secondary | ICD-10-CM | POA: Diagnosis not present

## 2020-02-07 DIAGNOSIS — I739 Peripheral vascular disease, unspecified: Secondary | ICD-10-CM | POA: Diagnosis not present

## 2020-02-07 DIAGNOSIS — F0150 Vascular dementia without behavioral disturbance: Secondary | ICD-10-CM | POA: Diagnosis not present

## 2020-02-07 DIAGNOSIS — I69318 Other symptoms and signs involving cognitive functions following cerebral infarction: Secondary | ICD-10-CM | POA: Diagnosis not present

## 2020-02-07 DIAGNOSIS — G309 Alzheimer's disease, unspecified: Secondary | ICD-10-CM | POA: Diagnosis not present

## 2020-02-07 DIAGNOSIS — I350 Nonrheumatic aortic (valve) stenosis: Secondary | ICD-10-CM | POA: Diagnosis not present

## 2020-02-09 DIAGNOSIS — I739 Peripheral vascular disease, unspecified: Secondary | ICD-10-CM | POA: Diagnosis not present

## 2020-02-09 DIAGNOSIS — G309 Alzheimer's disease, unspecified: Secondary | ICD-10-CM | POA: Diagnosis not present

## 2020-02-09 DIAGNOSIS — I350 Nonrheumatic aortic (valve) stenosis: Secondary | ICD-10-CM | POA: Diagnosis not present

## 2020-02-09 DIAGNOSIS — F0150 Vascular dementia without behavioral disturbance: Secondary | ICD-10-CM | POA: Diagnosis not present

## 2020-02-09 DIAGNOSIS — F0280 Dementia in other diseases classified elsewhere without behavioral disturbance: Secondary | ICD-10-CM | POA: Diagnosis not present

## 2020-02-09 DIAGNOSIS — I69318 Other symptoms and signs involving cognitive functions following cerebral infarction: Secondary | ICD-10-CM | POA: Diagnosis not present

## 2020-02-10 DIAGNOSIS — G309 Alzheimer's disease, unspecified: Secondary | ICD-10-CM | POA: Diagnosis not present

## 2020-02-10 DIAGNOSIS — F0280 Dementia in other diseases classified elsewhere without behavioral disturbance: Secondary | ICD-10-CM | POA: Diagnosis not present

## 2020-02-10 DIAGNOSIS — F0150 Vascular dementia without behavioral disturbance: Secondary | ICD-10-CM | POA: Diagnosis not present

## 2020-02-10 DIAGNOSIS — I739 Peripheral vascular disease, unspecified: Secondary | ICD-10-CM | POA: Diagnosis not present

## 2020-02-10 DIAGNOSIS — I350 Nonrheumatic aortic (valve) stenosis: Secondary | ICD-10-CM | POA: Diagnosis not present

## 2020-02-10 DIAGNOSIS — I69318 Other symptoms and signs involving cognitive functions following cerebral infarction: Secondary | ICD-10-CM | POA: Diagnosis not present

## 2020-02-12 DIAGNOSIS — G309 Alzheimer's disease, unspecified: Secondary | ICD-10-CM | POA: Diagnosis not present

## 2020-02-12 DIAGNOSIS — F0280 Dementia in other diseases classified elsewhere without behavioral disturbance: Secondary | ICD-10-CM | POA: Diagnosis not present

## 2020-02-12 DIAGNOSIS — F0150 Vascular dementia without behavioral disturbance: Secondary | ICD-10-CM | POA: Diagnosis not present

## 2020-02-12 DIAGNOSIS — I739 Peripheral vascular disease, unspecified: Secondary | ICD-10-CM | POA: Diagnosis not present

## 2020-02-12 DIAGNOSIS — I69318 Other symptoms and signs involving cognitive functions following cerebral infarction: Secondary | ICD-10-CM | POA: Diagnosis not present

## 2020-02-12 DIAGNOSIS — I350 Nonrheumatic aortic (valve) stenosis: Secondary | ICD-10-CM | POA: Diagnosis not present

## 2020-02-14 DIAGNOSIS — I69318 Other symptoms and signs involving cognitive functions following cerebral infarction: Secondary | ICD-10-CM | POA: Diagnosis not present

## 2020-02-14 DIAGNOSIS — I739 Peripheral vascular disease, unspecified: Secondary | ICD-10-CM | POA: Diagnosis not present

## 2020-02-14 DIAGNOSIS — I350 Nonrheumatic aortic (valve) stenosis: Secondary | ICD-10-CM | POA: Diagnosis not present

## 2020-02-14 DIAGNOSIS — F0150 Vascular dementia without behavioral disturbance: Secondary | ICD-10-CM | POA: Diagnosis not present

## 2020-02-14 DIAGNOSIS — G309 Alzheimer's disease, unspecified: Secondary | ICD-10-CM | POA: Diagnosis not present

## 2020-02-14 DIAGNOSIS — F0280 Dementia in other diseases classified elsewhere without behavioral disturbance: Secondary | ICD-10-CM | POA: Diagnosis not present

## 2020-02-15 DIAGNOSIS — F329 Major depressive disorder, single episode, unspecified: Secondary | ICD-10-CM | POA: Diagnosis not present

## 2020-02-15 DIAGNOSIS — F0280 Dementia in other diseases classified elsewhere without behavioral disturbance: Secondary | ICD-10-CM | POA: Diagnosis not present

## 2020-02-15 DIAGNOSIS — I350 Nonrheumatic aortic (valve) stenosis: Secondary | ICD-10-CM | POA: Diagnosis not present

## 2020-02-15 DIAGNOSIS — F0150 Vascular dementia without behavioral disturbance: Secondary | ICD-10-CM | POA: Diagnosis not present

## 2020-02-15 DIAGNOSIS — I10 Essential (primary) hypertension: Secondary | ICD-10-CM | POA: Diagnosis not present

## 2020-02-15 DIAGNOSIS — G35 Multiple sclerosis: Secondary | ICD-10-CM | POA: Diagnosis not present

## 2020-02-15 DIAGNOSIS — Z87310 Personal history of (healed) osteoporosis fracture: Secondary | ICD-10-CM | POA: Diagnosis not present

## 2020-02-15 DIAGNOSIS — E039 Hypothyroidism, unspecified: Secondary | ICD-10-CM | POA: Diagnosis not present

## 2020-02-15 DIAGNOSIS — I739 Peripheral vascular disease, unspecified: Secondary | ICD-10-CM | POA: Diagnosis not present

## 2020-02-15 DIAGNOSIS — R32 Unspecified urinary incontinence: Secondary | ICD-10-CM | POA: Diagnosis not present

## 2020-02-15 DIAGNOSIS — Z8639 Personal history of other endocrine, nutritional and metabolic disease: Secondary | ICD-10-CM | POA: Diagnosis not present

## 2020-02-15 DIAGNOSIS — I482 Chronic atrial fibrillation, unspecified: Secondary | ICD-10-CM | POA: Diagnosis not present

## 2020-02-15 DIAGNOSIS — Z6827 Body mass index (BMI) 27.0-27.9, adult: Secondary | ICD-10-CM | POA: Diagnosis not present

## 2020-02-15 DIAGNOSIS — E785 Hyperlipidemia, unspecified: Secondary | ICD-10-CM | POA: Diagnosis not present

## 2020-02-15 DIAGNOSIS — R159 Full incontinence of feces: Secondary | ICD-10-CM | POA: Diagnosis not present

## 2020-02-15 DIAGNOSIS — Z741 Need for assistance with personal care: Secondary | ICD-10-CM | POA: Diagnosis not present

## 2020-02-15 DIAGNOSIS — I69318 Other symptoms and signs involving cognitive functions following cerebral infarction: Secondary | ICD-10-CM | POA: Diagnosis not present

## 2020-02-15 DIAGNOSIS — G309 Alzheimer's disease, unspecified: Secondary | ICD-10-CM | POA: Diagnosis not present

## 2020-02-15 DIAGNOSIS — M810 Age-related osteoporosis without current pathological fracture: Secondary | ICD-10-CM | POA: Diagnosis not present

## 2020-02-16 DIAGNOSIS — G309 Alzheimer's disease, unspecified: Secondary | ICD-10-CM | POA: Diagnosis not present

## 2020-02-16 DIAGNOSIS — I350 Nonrheumatic aortic (valve) stenosis: Secondary | ICD-10-CM | POA: Diagnosis not present

## 2020-02-16 DIAGNOSIS — F0280 Dementia in other diseases classified elsewhere without behavioral disturbance: Secondary | ICD-10-CM | POA: Diagnosis not present

## 2020-02-16 DIAGNOSIS — I69318 Other symptoms and signs involving cognitive functions following cerebral infarction: Secondary | ICD-10-CM | POA: Diagnosis not present

## 2020-02-16 DIAGNOSIS — I739 Peripheral vascular disease, unspecified: Secondary | ICD-10-CM | POA: Diagnosis not present

## 2020-02-16 DIAGNOSIS — F0150 Vascular dementia without behavioral disturbance: Secondary | ICD-10-CM | POA: Diagnosis not present

## 2020-02-19 DIAGNOSIS — I69318 Other symptoms and signs involving cognitive functions following cerebral infarction: Secondary | ICD-10-CM | POA: Diagnosis not present

## 2020-02-19 DIAGNOSIS — I739 Peripheral vascular disease, unspecified: Secondary | ICD-10-CM | POA: Diagnosis not present

## 2020-02-19 DIAGNOSIS — F0150 Vascular dementia without behavioral disturbance: Secondary | ICD-10-CM | POA: Diagnosis not present

## 2020-02-19 DIAGNOSIS — F0280 Dementia in other diseases classified elsewhere without behavioral disturbance: Secondary | ICD-10-CM | POA: Diagnosis not present

## 2020-02-19 DIAGNOSIS — G309 Alzheimer's disease, unspecified: Secondary | ICD-10-CM | POA: Diagnosis not present

## 2020-02-19 DIAGNOSIS — I350 Nonrheumatic aortic (valve) stenosis: Secondary | ICD-10-CM | POA: Diagnosis not present

## 2020-02-20 DIAGNOSIS — I350 Nonrheumatic aortic (valve) stenosis: Secondary | ICD-10-CM | POA: Diagnosis not present

## 2020-02-20 DIAGNOSIS — G309 Alzheimer's disease, unspecified: Secondary | ICD-10-CM | POA: Diagnosis not present

## 2020-02-20 DIAGNOSIS — F0150 Vascular dementia without behavioral disturbance: Secondary | ICD-10-CM | POA: Diagnosis not present

## 2020-02-20 DIAGNOSIS — F0280 Dementia in other diseases classified elsewhere without behavioral disturbance: Secondary | ICD-10-CM | POA: Diagnosis not present

## 2020-02-20 DIAGNOSIS — I69318 Other symptoms and signs involving cognitive functions following cerebral infarction: Secondary | ICD-10-CM | POA: Diagnosis not present

## 2020-02-20 DIAGNOSIS — I739 Peripheral vascular disease, unspecified: Secondary | ICD-10-CM | POA: Diagnosis not present

## 2020-02-21 DIAGNOSIS — I739 Peripheral vascular disease, unspecified: Secondary | ICD-10-CM | POA: Diagnosis not present

## 2020-02-21 DIAGNOSIS — I350 Nonrheumatic aortic (valve) stenosis: Secondary | ICD-10-CM | POA: Diagnosis not present

## 2020-02-21 DIAGNOSIS — F0280 Dementia in other diseases classified elsewhere without behavioral disturbance: Secondary | ICD-10-CM | POA: Diagnosis not present

## 2020-02-21 DIAGNOSIS — F0150 Vascular dementia without behavioral disturbance: Secondary | ICD-10-CM | POA: Diagnosis not present

## 2020-02-21 DIAGNOSIS — G309 Alzheimer's disease, unspecified: Secondary | ICD-10-CM | POA: Diagnosis not present

## 2020-02-21 DIAGNOSIS — I69318 Other symptoms and signs involving cognitive functions following cerebral infarction: Secondary | ICD-10-CM | POA: Diagnosis not present

## 2020-02-23 DIAGNOSIS — I350 Nonrheumatic aortic (valve) stenosis: Secondary | ICD-10-CM | POA: Diagnosis not present

## 2020-02-23 DIAGNOSIS — I739 Peripheral vascular disease, unspecified: Secondary | ICD-10-CM | POA: Diagnosis not present

## 2020-02-23 DIAGNOSIS — F0150 Vascular dementia without behavioral disturbance: Secondary | ICD-10-CM | POA: Diagnosis not present

## 2020-02-23 DIAGNOSIS — G309 Alzheimer's disease, unspecified: Secondary | ICD-10-CM | POA: Diagnosis not present

## 2020-02-23 DIAGNOSIS — I69318 Other symptoms and signs involving cognitive functions following cerebral infarction: Secondary | ICD-10-CM | POA: Diagnosis not present

## 2020-02-23 DIAGNOSIS — F0280 Dementia in other diseases classified elsewhere without behavioral disturbance: Secondary | ICD-10-CM | POA: Diagnosis not present

## 2020-02-26 DIAGNOSIS — F0280 Dementia in other diseases classified elsewhere without behavioral disturbance: Secondary | ICD-10-CM | POA: Diagnosis not present

## 2020-02-26 DIAGNOSIS — I350 Nonrheumatic aortic (valve) stenosis: Secondary | ICD-10-CM | POA: Diagnosis not present

## 2020-02-26 DIAGNOSIS — G309 Alzheimer's disease, unspecified: Secondary | ICD-10-CM | POA: Diagnosis not present

## 2020-02-26 DIAGNOSIS — I739 Peripheral vascular disease, unspecified: Secondary | ICD-10-CM | POA: Diagnosis not present

## 2020-02-26 DIAGNOSIS — F0150 Vascular dementia without behavioral disturbance: Secondary | ICD-10-CM | POA: Diagnosis not present

## 2020-02-26 DIAGNOSIS — I69318 Other symptoms and signs involving cognitive functions following cerebral infarction: Secondary | ICD-10-CM | POA: Diagnosis not present

## 2020-02-28 DIAGNOSIS — F0150 Vascular dementia without behavioral disturbance: Secondary | ICD-10-CM | POA: Diagnosis not present

## 2020-02-28 DIAGNOSIS — F0280 Dementia in other diseases classified elsewhere without behavioral disturbance: Secondary | ICD-10-CM | POA: Diagnosis not present

## 2020-02-28 DIAGNOSIS — I350 Nonrheumatic aortic (valve) stenosis: Secondary | ICD-10-CM | POA: Diagnosis not present

## 2020-02-28 DIAGNOSIS — I69318 Other symptoms and signs involving cognitive functions following cerebral infarction: Secondary | ICD-10-CM | POA: Diagnosis not present

## 2020-02-28 DIAGNOSIS — G309 Alzheimer's disease, unspecified: Secondary | ICD-10-CM | POA: Diagnosis not present

## 2020-02-28 DIAGNOSIS — I739 Peripheral vascular disease, unspecified: Secondary | ICD-10-CM | POA: Diagnosis not present

## 2020-03-01 DIAGNOSIS — F0280 Dementia in other diseases classified elsewhere without behavioral disturbance: Secondary | ICD-10-CM | POA: Diagnosis not present

## 2020-03-01 DIAGNOSIS — F0150 Vascular dementia without behavioral disturbance: Secondary | ICD-10-CM | POA: Diagnosis not present

## 2020-03-01 DIAGNOSIS — I69318 Other symptoms and signs involving cognitive functions following cerebral infarction: Secondary | ICD-10-CM | POA: Diagnosis not present

## 2020-03-01 DIAGNOSIS — G309 Alzheimer's disease, unspecified: Secondary | ICD-10-CM | POA: Diagnosis not present

## 2020-03-01 DIAGNOSIS — I350 Nonrheumatic aortic (valve) stenosis: Secondary | ICD-10-CM | POA: Diagnosis not present

## 2020-03-01 DIAGNOSIS — I739 Peripheral vascular disease, unspecified: Secondary | ICD-10-CM | POA: Diagnosis not present

## 2020-03-04 DIAGNOSIS — I69318 Other symptoms and signs involving cognitive functions following cerebral infarction: Secondary | ICD-10-CM | POA: Diagnosis not present

## 2020-03-04 DIAGNOSIS — G309 Alzheimer's disease, unspecified: Secondary | ICD-10-CM | POA: Diagnosis not present

## 2020-03-04 DIAGNOSIS — I350 Nonrheumatic aortic (valve) stenosis: Secondary | ICD-10-CM | POA: Diagnosis not present

## 2020-03-04 DIAGNOSIS — F0280 Dementia in other diseases classified elsewhere without behavioral disturbance: Secondary | ICD-10-CM | POA: Diagnosis not present

## 2020-03-04 DIAGNOSIS — F0150 Vascular dementia without behavioral disturbance: Secondary | ICD-10-CM | POA: Diagnosis not present

## 2020-03-04 DIAGNOSIS — I739 Peripheral vascular disease, unspecified: Secondary | ICD-10-CM | POA: Diagnosis not present

## 2020-03-08 DIAGNOSIS — I350 Nonrheumatic aortic (valve) stenosis: Secondary | ICD-10-CM | POA: Diagnosis not present

## 2020-03-08 DIAGNOSIS — F0280 Dementia in other diseases classified elsewhere without behavioral disturbance: Secondary | ICD-10-CM | POA: Diagnosis not present

## 2020-03-08 DIAGNOSIS — F0150 Vascular dementia without behavioral disturbance: Secondary | ICD-10-CM | POA: Diagnosis not present

## 2020-03-08 DIAGNOSIS — G309 Alzheimer's disease, unspecified: Secondary | ICD-10-CM | POA: Diagnosis not present

## 2020-03-08 DIAGNOSIS — I739 Peripheral vascular disease, unspecified: Secondary | ICD-10-CM | POA: Diagnosis not present

## 2020-03-08 DIAGNOSIS — I69318 Other symptoms and signs involving cognitive functions following cerebral infarction: Secondary | ICD-10-CM | POA: Diagnosis not present

## 2020-03-11 DIAGNOSIS — G309 Alzheimer's disease, unspecified: Secondary | ICD-10-CM | POA: Diagnosis not present

## 2020-03-11 DIAGNOSIS — F0280 Dementia in other diseases classified elsewhere without behavioral disturbance: Secondary | ICD-10-CM | POA: Diagnosis not present

## 2020-03-11 DIAGNOSIS — F0150 Vascular dementia without behavioral disturbance: Secondary | ICD-10-CM | POA: Diagnosis not present

## 2020-03-11 DIAGNOSIS — I739 Peripheral vascular disease, unspecified: Secondary | ICD-10-CM | POA: Diagnosis not present

## 2020-03-11 DIAGNOSIS — I69318 Other symptoms and signs involving cognitive functions following cerebral infarction: Secondary | ICD-10-CM | POA: Diagnosis not present

## 2020-03-11 DIAGNOSIS — I350 Nonrheumatic aortic (valve) stenosis: Secondary | ICD-10-CM | POA: Diagnosis not present

## 2020-03-12 DIAGNOSIS — F0280 Dementia in other diseases classified elsewhere without behavioral disturbance: Secondary | ICD-10-CM | POA: Diagnosis not present

## 2020-03-12 DIAGNOSIS — I69318 Other symptoms and signs involving cognitive functions following cerebral infarction: Secondary | ICD-10-CM | POA: Diagnosis not present

## 2020-03-12 DIAGNOSIS — I739 Peripheral vascular disease, unspecified: Secondary | ICD-10-CM | POA: Diagnosis not present

## 2020-03-12 DIAGNOSIS — G309 Alzheimer's disease, unspecified: Secondary | ICD-10-CM | POA: Diagnosis not present

## 2020-03-12 DIAGNOSIS — F0150 Vascular dementia without behavioral disturbance: Secondary | ICD-10-CM | POA: Diagnosis not present

## 2020-03-12 DIAGNOSIS — I350 Nonrheumatic aortic (valve) stenosis: Secondary | ICD-10-CM | POA: Diagnosis not present

## 2020-03-15 DIAGNOSIS — I739 Peripheral vascular disease, unspecified: Secondary | ICD-10-CM | POA: Diagnosis not present

## 2020-03-15 DIAGNOSIS — I69318 Other symptoms and signs involving cognitive functions following cerebral infarction: Secondary | ICD-10-CM | POA: Diagnosis not present

## 2020-03-15 DIAGNOSIS — F0150 Vascular dementia without behavioral disturbance: Secondary | ICD-10-CM | POA: Diagnosis not present

## 2020-03-15 DIAGNOSIS — I350 Nonrheumatic aortic (valve) stenosis: Secondary | ICD-10-CM | POA: Diagnosis not present

## 2020-03-15 DIAGNOSIS — F0280 Dementia in other diseases classified elsewhere without behavioral disturbance: Secondary | ICD-10-CM | POA: Diagnosis not present

## 2020-03-15 DIAGNOSIS — G309 Alzheimer's disease, unspecified: Secondary | ICD-10-CM | POA: Diagnosis not present

## 2020-03-17 DIAGNOSIS — Z8639 Personal history of other endocrine, nutritional and metabolic disease: Secondary | ICD-10-CM | POA: Diagnosis not present

## 2020-03-17 DIAGNOSIS — G35 Multiple sclerosis: Secondary | ICD-10-CM | POA: Diagnosis not present

## 2020-03-17 DIAGNOSIS — E785 Hyperlipidemia, unspecified: Secondary | ICD-10-CM | POA: Diagnosis not present

## 2020-03-17 DIAGNOSIS — R159 Full incontinence of feces: Secondary | ICD-10-CM | POA: Diagnosis not present

## 2020-03-17 DIAGNOSIS — I10 Essential (primary) hypertension: Secondary | ICD-10-CM | POA: Diagnosis not present

## 2020-03-17 DIAGNOSIS — M810 Age-related osteoporosis without current pathological fracture: Secondary | ICD-10-CM | POA: Diagnosis not present

## 2020-03-17 DIAGNOSIS — F0150 Vascular dementia without behavioral disturbance: Secondary | ICD-10-CM | POA: Diagnosis not present

## 2020-03-17 DIAGNOSIS — F329 Major depressive disorder, single episode, unspecified: Secondary | ICD-10-CM | POA: Diagnosis not present

## 2020-03-17 DIAGNOSIS — G309 Alzheimer's disease, unspecified: Secondary | ICD-10-CM | POA: Diagnosis not present

## 2020-03-17 DIAGNOSIS — Z741 Need for assistance with personal care: Secondary | ICD-10-CM | POA: Diagnosis not present

## 2020-03-17 DIAGNOSIS — E039 Hypothyroidism, unspecified: Secondary | ICD-10-CM | POA: Diagnosis not present

## 2020-03-17 DIAGNOSIS — I69318 Other symptoms and signs involving cognitive functions following cerebral infarction: Secondary | ICD-10-CM | POA: Diagnosis not present

## 2020-03-17 DIAGNOSIS — F0280 Dementia in other diseases classified elsewhere without behavioral disturbance: Secondary | ICD-10-CM | POA: Diagnosis not present

## 2020-03-17 DIAGNOSIS — I482 Chronic atrial fibrillation, unspecified: Secondary | ICD-10-CM | POA: Diagnosis not present

## 2020-03-17 DIAGNOSIS — I350 Nonrheumatic aortic (valve) stenosis: Secondary | ICD-10-CM | POA: Diagnosis not present

## 2020-03-17 DIAGNOSIS — R32 Unspecified urinary incontinence: Secondary | ICD-10-CM | POA: Diagnosis not present

## 2020-03-17 DIAGNOSIS — I739 Peripheral vascular disease, unspecified: Secondary | ICD-10-CM | POA: Diagnosis not present

## 2020-03-17 DIAGNOSIS — Z6827 Body mass index (BMI) 27.0-27.9, adult: Secondary | ICD-10-CM | POA: Diagnosis not present

## 2020-03-17 DIAGNOSIS — Z87310 Personal history of (healed) osteoporosis fracture: Secondary | ICD-10-CM | POA: Diagnosis not present

## 2020-03-18 DIAGNOSIS — I739 Peripheral vascular disease, unspecified: Secondary | ICD-10-CM | POA: Diagnosis not present

## 2020-03-18 DIAGNOSIS — I69318 Other symptoms and signs involving cognitive functions following cerebral infarction: Secondary | ICD-10-CM | POA: Diagnosis not present

## 2020-03-18 DIAGNOSIS — I350 Nonrheumatic aortic (valve) stenosis: Secondary | ICD-10-CM | POA: Diagnosis not present

## 2020-03-18 DIAGNOSIS — G309 Alzheimer's disease, unspecified: Secondary | ICD-10-CM | POA: Diagnosis not present

## 2020-03-18 DIAGNOSIS — F0280 Dementia in other diseases classified elsewhere without behavioral disturbance: Secondary | ICD-10-CM | POA: Diagnosis not present

## 2020-03-18 DIAGNOSIS — F0150 Vascular dementia without behavioral disturbance: Secondary | ICD-10-CM | POA: Diagnosis not present

## 2020-03-19 DIAGNOSIS — F0150 Vascular dementia without behavioral disturbance: Secondary | ICD-10-CM | POA: Diagnosis not present

## 2020-03-19 DIAGNOSIS — I350 Nonrheumatic aortic (valve) stenosis: Secondary | ICD-10-CM | POA: Diagnosis not present

## 2020-03-19 DIAGNOSIS — G309 Alzheimer's disease, unspecified: Secondary | ICD-10-CM | POA: Diagnosis not present

## 2020-03-19 DIAGNOSIS — I69318 Other symptoms and signs involving cognitive functions following cerebral infarction: Secondary | ICD-10-CM | POA: Diagnosis not present

## 2020-03-19 DIAGNOSIS — I739 Peripheral vascular disease, unspecified: Secondary | ICD-10-CM | POA: Diagnosis not present

## 2020-03-19 DIAGNOSIS — F0280 Dementia in other diseases classified elsewhere without behavioral disturbance: Secondary | ICD-10-CM | POA: Diagnosis not present

## 2020-03-20 DIAGNOSIS — I350 Nonrheumatic aortic (valve) stenosis: Secondary | ICD-10-CM | POA: Diagnosis not present

## 2020-03-20 DIAGNOSIS — F0280 Dementia in other diseases classified elsewhere without behavioral disturbance: Secondary | ICD-10-CM | POA: Diagnosis not present

## 2020-03-20 DIAGNOSIS — I739 Peripheral vascular disease, unspecified: Secondary | ICD-10-CM | POA: Diagnosis not present

## 2020-03-20 DIAGNOSIS — I69318 Other symptoms and signs involving cognitive functions following cerebral infarction: Secondary | ICD-10-CM | POA: Diagnosis not present

## 2020-03-20 DIAGNOSIS — G309 Alzheimer's disease, unspecified: Secondary | ICD-10-CM | POA: Diagnosis not present

## 2020-03-20 DIAGNOSIS — F0150 Vascular dementia without behavioral disturbance: Secondary | ICD-10-CM | POA: Diagnosis not present

## 2020-03-22 DIAGNOSIS — G309 Alzheimer's disease, unspecified: Secondary | ICD-10-CM | POA: Diagnosis not present

## 2020-03-22 DIAGNOSIS — I350 Nonrheumatic aortic (valve) stenosis: Secondary | ICD-10-CM | POA: Diagnosis not present

## 2020-03-22 DIAGNOSIS — I69318 Other symptoms and signs involving cognitive functions following cerebral infarction: Secondary | ICD-10-CM | POA: Diagnosis not present

## 2020-03-22 DIAGNOSIS — F0280 Dementia in other diseases classified elsewhere without behavioral disturbance: Secondary | ICD-10-CM | POA: Diagnosis not present

## 2020-03-22 DIAGNOSIS — F0150 Vascular dementia without behavioral disturbance: Secondary | ICD-10-CM | POA: Diagnosis not present

## 2020-03-22 DIAGNOSIS — I739 Peripheral vascular disease, unspecified: Secondary | ICD-10-CM | POA: Diagnosis not present

## 2020-03-25 DIAGNOSIS — I350 Nonrheumatic aortic (valve) stenosis: Secondary | ICD-10-CM | POA: Diagnosis not present

## 2020-03-25 DIAGNOSIS — F0150 Vascular dementia without behavioral disturbance: Secondary | ICD-10-CM | POA: Diagnosis not present

## 2020-03-25 DIAGNOSIS — F0280 Dementia in other diseases classified elsewhere without behavioral disturbance: Secondary | ICD-10-CM | POA: Diagnosis not present

## 2020-03-25 DIAGNOSIS — I69318 Other symptoms and signs involving cognitive functions following cerebral infarction: Secondary | ICD-10-CM | POA: Diagnosis not present

## 2020-03-25 DIAGNOSIS — G309 Alzheimer's disease, unspecified: Secondary | ICD-10-CM | POA: Diagnosis not present

## 2020-03-25 DIAGNOSIS — I739 Peripheral vascular disease, unspecified: Secondary | ICD-10-CM | POA: Diagnosis not present

## 2020-03-26 DIAGNOSIS — I739 Peripheral vascular disease, unspecified: Secondary | ICD-10-CM | POA: Diagnosis not present

## 2020-03-26 DIAGNOSIS — F0150 Vascular dementia without behavioral disturbance: Secondary | ICD-10-CM | POA: Diagnosis not present

## 2020-03-26 DIAGNOSIS — I69318 Other symptoms and signs involving cognitive functions following cerebral infarction: Secondary | ICD-10-CM | POA: Diagnosis not present

## 2020-03-26 DIAGNOSIS — G309 Alzheimer's disease, unspecified: Secondary | ICD-10-CM | POA: Diagnosis not present

## 2020-03-26 DIAGNOSIS — F0280 Dementia in other diseases classified elsewhere without behavioral disturbance: Secondary | ICD-10-CM | POA: Diagnosis not present

## 2020-03-26 DIAGNOSIS — I350 Nonrheumatic aortic (valve) stenosis: Secondary | ICD-10-CM | POA: Diagnosis not present

## 2020-03-27 DIAGNOSIS — F0150 Vascular dementia without behavioral disturbance: Secondary | ICD-10-CM | POA: Diagnosis not present

## 2020-03-27 DIAGNOSIS — F0280 Dementia in other diseases classified elsewhere without behavioral disturbance: Secondary | ICD-10-CM | POA: Diagnosis not present

## 2020-03-27 DIAGNOSIS — I350 Nonrheumatic aortic (valve) stenosis: Secondary | ICD-10-CM | POA: Diagnosis not present

## 2020-03-27 DIAGNOSIS — I69318 Other symptoms and signs involving cognitive functions following cerebral infarction: Secondary | ICD-10-CM | POA: Diagnosis not present

## 2020-03-27 DIAGNOSIS — G309 Alzheimer's disease, unspecified: Secondary | ICD-10-CM | POA: Diagnosis not present

## 2020-03-27 DIAGNOSIS — I739 Peripheral vascular disease, unspecified: Secondary | ICD-10-CM | POA: Diagnosis not present

## 2020-03-29 DIAGNOSIS — F0150 Vascular dementia without behavioral disturbance: Secondary | ICD-10-CM | POA: Diagnosis not present

## 2020-03-29 DIAGNOSIS — I739 Peripheral vascular disease, unspecified: Secondary | ICD-10-CM | POA: Diagnosis not present

## 2020-03-29 DIAGNOSIS — I69318 Other symptoms and signs involving cognitive functions following cerebral infarction: Secondary | ICD-10-CM | POA: Diagnosis not present

## 2020-03-29 DIAGNOSIS — F0280 Dementia in other diseases classified elsewhere without behavioral disturbance: Secondary | ICD-10-CM | POA: Diagnosis not present

## 2020-03-29 DIAGNOSIS — I350 Nonrheumatic aortic (valve) stenosis: Secondary | ICD-10-CM | POA: Diagnosis not present

## 2020-03-29 DIAGNOSIS — G309 Alzheimer's disease, unspecified: Secondary | ICD-10-CM | POA: Diagnosis not present

## 2020-04-01 DIAGNOSIS — G309 Alzheimer's disease, unspecified: Secondary | ICD-10-CM | POA: Diagnosis not present

## 2020-04-01 DIAGNOSIS — F0280 Dementia in other diseases classified elsewhere without behavioral disturbance: Secondary | ICD-10-CM | POA: Diagnosis not present

## 2020-04-01 DIAGNOSIS — I69318 Other symptoms and signs involving cognitive functions following cerebral infarction: Secondary | ICD-10-CM | POA: Diagnosis not present

## 2020-04-01 DIAGNOSIS — F0150 Vascular dementia without behavioral disturbance: Secondary | ICD-10-CM | POA: Diagnosis not present

## 2020-04-01 DIAGNOSIS — I739 Peripheral vascular disease, unspecified: Secondary | ICD-10-CM | POA: Diagnosis not present

## 2020-04-01 DIAGNOSIS — I350 Nonrheumatic aortic (valve) stenosis: Secondary | ICD-10-CM | POA: Diagnosis not present

## 2020-04-04 DIAGNOSIS — I350 Nonrheumatic aortic (valve) stenosis: Secondary | ICD-10-CM | POA: Diagnosis not present

## 2020-04-04 DIAGNOSIS — I69318 Other symptoms and signs involving cognitive functions following cerebral infarction: Secondary | ICD-10-CM | POA: Diagnosis not present

## 2020-04-04 DIAGNOSIS — I739 Peripheral vascular disease, unspecified: Secondary | ICD-10-CM | POA: Diagnosis not present

## 2020-04-04 DIAGNOSIS — G309 Alzheimer's disease, unspecified: Secondary | ICD-10-CM | POA: Diagnosis not present

## 2020-04-04 DIAGNOSIS — F0150 Vascular dementia without behavioral disturbance: Secondary | ICD-10-CM | POA: Diagnosis not present

## 2020-04-04 DIAGNOSIS — F0280 Dementia in other diseases classified elsewhere without behavioral disturbance: Secondary | ICD-10-CM | POA: Diagnosis not present

## 2020-04-05 DIAGNOSIS — I69318 Other symptoms and signs involving cognitive functions following cerebral infarction: Secondary | ICD-10-CM | POA: Diagnosis not present

## 2020-04-05 DIAGNOSIS — I739 Peripheral vascular disease, unspecified: Secondary | ICD-10-CM | POA: Diagnosis not present

## 2020-04-05 DIAGNOSIS — F0150 Vascular dementia without behavioral disturbance: Secondary | ICD-10-CM | POA: Diagnosis not present

## 2020-04-05 DIAGNOSIS — F0280 Dementia in other diseases classified elsewhere without behavioral disturbance: Secondary | ICD-10-CM | POA: Diagnosis not present

## 2020-04-05 DIAGNOSIS — G309 Alzheimer's disease, unspecified: Secondary | ICD-10-CM | POA: Diagnosis not present

## 2020-04-05 DIAGNOSIS — I350 Nonrheumatic aortic (valve) stenosis: Secondary | ICD-10-CM | POA: Diagnosis not present

## 2020-04-08 DIAGNOSIS — F0280 Dementia in other diseases classified elsewhere without behavioral disturbance: Secondary | ICD-10-CM | POA: Diagnosis not present

## 2020-04-08 DIAGNOSIS — I739 Peripheral vascular disease, unspecified: Secondary | ICD-10-CM | POA: Diagnosis not present

## 2020-04-08 DIAGNOSIS — I69318 Other symptoms and signs involving cognitive functions following cerebral infarction: Secondary | ICD-10-CM | POA: Diagnosis not present

## 2020-04-08 DIAGNOSIS — F0150 Vascular dementia without behavioral disturbance: Secondary | ICD-10-CM | POA: Diagnosis not present

## 2020-04-08 DIAGNOSIS — I350 Nonrheumatic aortic (valve) stenosis: Secondary | ICD-10-CM | POA: Diagnosis not present

## 2020-04-08 DIAGNOSIS — G309 Alzheimer's disease, unspecified: Secondary | ICD-10-CM | POA: Diagnosis not present

## 2020-04-09 DIAGNOSIS — I739 Peripheral vascular disease, unspecified: Secondary | ICD-10-CM | POA: Diagnosis not present

## 2020-04-09 DIAGNOSIS — I69318 Other symptoms and signs involving cognitive functions following cerebral infarction: Secondary | ICD-10-CM | POA: Diagnosis not present

## 2020-04-09 DIAGNOSIS — G309 Alzheimer's disease, unspecified: Secondary | ICD-10-CM | POA: Diagnosis not present

## 2020-04-09 DIAGNOSIS — F0150 Vascular dementia without behavioral disturbance: Secondary | ICD-10-CM | POA: Diagnosis not present

## 2020-04-09 DIAGNOSIS — F0280 Dementia in other diseases classified elsewhere without behavioral disturbance: Secondary | ICD-10-CM | POA: Diagnosis not present

## 2020-04-09 DIAGNOSIS — I350 Nonrheumatic aortic (valve) stenosis: Secondary | ICD-10-CM | POA: Diagnosis not present

## 2020-04-10 DIAGNOSIS — G309 Alzheimer's disease, unspecified: Secondary | ICD-10-CM | POA: Diagnosis not present

## 2020-04-10 DIAGNOSIS — F0150 Vascular dementia without behavioral disturbance: Secondary | ICD-10-CM | POA: Diagnosis not present

## 2020-04-10 DIAGNOSIS — I350 Nonrheumatic aortic (valve) stenosis: Secondary | ICD-10-CM | POA: Diagnosis not present

## 2020-04-10 DIAGNOSIS — F0280 Dementia in other diseases classified elsewhere without behavioral disturbance: Secondary | ICD-10-CM | POA: Diagnosis not present

## 2020-04-10 DIAGNOSIS — I69318 Other symptoms and signs involving cognitive functions following cerebral infarction: Secondary | ICD-10-CM | POA: Diagnosis not present

## 2020-04-10 DIAGNOSIS — I739 Peripheral vascular disease, unspecified: Secondary | ICD-10-CM | POA: Diagnosis not present

## 2020-04-11 DIAGNOSIS — N390 Urinary tract infection, site not specified: Secondary | ICD-10-CM | POA: Diagnosis not present

## 2020-04-12 DIAGNOSIS — I69318 Other symptoms and signs involving cognitive functions following cerebral infarction: Secondary | ICD-10-CM | POA: Diagnosis not present

## 2020-04-12 DIAGNOSIS — F0280 Dementia in other diseases classified elsewhere without behavioral disturbance: Secondary | ICD-10-CM | POA: Diagnosis not present

## 2020-04-12 DIAGNOSIS — I350 Nonrheumatic aortic (valve) stenosis: Secondary | ICD-10-CM | POA: Diagnosis not present

## 2020-04-12 DIAGNOSIS — G309 Alzheimer's disease, unspecified: Secondary | ICD-10-CM | POA: Diagnosis not present

## 2020-04-12 DIAGNOSIS — F0150 Vascular dementia without behavioral disturbance: Secondary | ICD-10-CM | POA: Diagnosis not present

## 2020-04-12 DIAGNOSIS — I739 Peripheral vascular disease, unspecified: Secondary | ICD-10-CM | POA: Diagnosis not present

## 2020-04-15 DIAGNOSIS — I350 Nonrheumatic aortic (valve) stenosis: Secondary | ICD-10-CM | POA: Diagnosis not present

## 2020-04-15 DIAGNOSIS — F0150 Vascular dementia without behavioral disturbance: Secondary | ICD-10-CM | POA: Diagnosis not present

## 2020-04-15 DIAGNOSIS — I739 Peripheral vascular disease, unspecified: Secondary | ICD-10-CM | POA: Diagnosis not present

## 2020-04-15 DIAGNOSIS — I69318 Other symptoms and signs involving cognitive functions following cerebral infarction: Secondary | ICD-10-CM | POA: Diagnosis not present

## 2020-04-15 DIAGNOSIS — F0280 Dementia in other diseases classified elsewhere without behavioral disturbance: Secondary | ICD-10-CM | POA: Diagnosis not present

## 2020-04-15 DIAGNOSIS — G309 Alzheimer's disease, unspecified: Secondary | ICD-10-CM | POA: Diagnosis not present

## 2020-04-16 DIAGNOSIS — G309 Alzheimer's disease, unspecified: Secondary | ICD-10-CM | POA: Diagnosis not present

## 2020-04-16 DIAGNOSIS — I69318 Other symptoms and signs involving cognitive functions following cerebral infarction: Secondary | ICD-10-CM | POA: Diagnosis not present

## 2020-04-16 DIAGNOSIS — I350 Nonrheumatic aortic (valve) stenosis: Secondary | ICD-10-CM | POA: Diagnosis not present

## 2020-04-16 DIAGNOSIS — F0150 Vascular dementia without behavioral disturbance: Secondary | ICD-10-CM | POA: Diagnosis not present

## 2020-04-16 DIAGNOSIS — I739 Peripheral vascular disease, unspecified: Secondary | ICD-10-CM | POA: Diagnosis not present

## 2020-04-16 DIAGNOSIS — F0280 Dementia in other diseases classified elsewhere without behavioral disturbance: Secondary | ICD-10-CM | POA: Diagnosis not present

## 2020-04-17 DIAGNOSIS — R32 Unspecified urinary incontinence: Secondary | ICD-10-CM | POA: Diagnosis not present

## 2020-04-17 DIAGNOSIS — I10 Essential (primary) hypertension: Secondary | ICD-10-CM | POA: Diagnosis not present

## 2020-04-17 DIAGNOSIS — R159 Full incontinence of feces: Secondary | ICD-10-CM | POA: Diagnosis not present

## 2020-04-17 DIAGNOSIS — E039 Hypothyroidism, unspecified: Secondary | ICD-10-CM | POA: Diagnosis not present

## 2020-04-17 DIAGNOSIS — Z741 Need for assistance with personal care: Secondary | ICD-10-CM | POA: Diagnosis not present

## 2020-04-17 DIAGNOSIS — G35 Multiple sclerosis: Secondary | ICD-10-CM | POA: Diagnosis not present

## 2020-04-17 DIAGNOSIS — I482 Chronic atrial fibrillation, unspecified: Secondary | ICD-10-CM | POA: Diagnosis not present

## 2020-04-17 DIAGNOSIS — Z6827 Body mass index (BMI) 27.0-27.9, adult: Secondary | ICD-10-CM | POA: Diagnosis not present

## 2020-04-17 DIAGNOSIS — I739 Peripheral vascular disease, unspecified: Secondary | ICD-10-CM | POA: Diagnosis not present

## 2020-04-17 DIAGNOSIS — I350 Nonrheumatic aortic (valve) stenosis: Secondary | ICD-10-CM | POA: Diagnosis not present

## 2020-04-17 DIAGNOSIS — F329 Major depressive disorder, single episode, unspecified: Secondary | ICD-10-CM | POA: Diagnosis not present

## 2020-04-17 DIAGNOSIS — F0280 Dementia in other diseases classified elsewhere without behavioral disturbance: Secondary | ICD-10-CM | POA: Diagnosis not present

## 2020-04-17 DIAGNOSIS — E785 Hyperlipidemia, unspecified: Secondary | ICD-10-CM | POA: Diagnosis not present

## 2020-04-17 DIAGNOSIS — F0150 Vascular dementia without behavioral disturbance: Secondary | ICD-10-CM | POA: Diagnosis not present

## 2020-04-17 DIAGNOSIS — Z87310 Personal history of (healed) osteoporosis fracture: Secondary | ICD-10-CM | POA: Diagnosis not present

## 2020-04-17 DIAGNOSIS — Z8639 Personal history of other endocrine, nutritional and metabolic disease: Secondary | ICD-10-CM | POA: Diagnosis not present

## 2020-04-17 DIAGNOSIS — I69318 Other symptoms and signs involving cognitive functions following cerebral infarction: Secondary | ICD-10-CM | POA: Diagnosis not present

## 2020-04-17 DIAGNOSIS — G309 Alzheimer's disease, unspecified: Secondary | ICD-10-CM | POA: Diagnosis not present

## 2020-04-17 DIAGNOSIS — M810 Age-related osteoporosis without current pathological fracture: Secondary | ICD-10-CM | POA: Diagnosis not present

## 2020-04-19 DIAGNOSIS — I350 Nonrheumatic aortic (valve) stenosis: Secondary | ICD-10-CM | POA: Diagnosis not present

## 2020-04-19 DIAGNOSIS — F0280 Dementia in other diseases classified elsewhere without behavioral disturbance: Secondary | ICD-10-CM | POA: Diagnosis not present

## 2020-04-19 DIAGNOSIS — G309 Alzheimer's disease, unspecified: Secondary | ICD-10-CM | POA: Diagnosis not present

## 2020-04-19 DIAGNOSIS — I739 Peripheral vascular disease, unspecified: Secondary | ICD-10-CM | POA: Diagnosis not present

## 2020-04-19 DIAGNOSIS — F0150 Vascular dementia without behavioral disturbance: Secondary | ICD-10-CM | POA: Diagnosis not present

## 2020-04-19 DIAGNOSIS — I69318 Other symptoms and signs involving cognitive functions following cerebral infarction: Secondary | ICD-10-CM | POA: Diagnosis not present

## 2020-04-22 DIAGNOSIS — G309 Alzheimer's disease, unspecified: Secondary | ICD-10-CM | POA: Diagnosis not present

## 2020-04-22 DIAGNOSIS — F0150 Vascular dementia without behavioral disturbance: Secondary | ICD-10-CM | POA: Diagnosis not present

## 2020-04-22 DIAGNOSIS — I350 Nonrheumatic aortic (valve) stenosis: Secondary | ICD-10-CM | POA: Diagnosis not present

## 2020-04-22 DIAGNOSIS — I739 Peripheral vascular disease, unspecified: Secondary | ICD-10-CM | POA: Diagnosis not present

## 2020-04-22 DIAGNOSIS — I69318 Other symptoms and signs involving cognitive functions following cerebral infarction: Secondary | ICD-10-CM | POA: Diagnosis not present

## 2020-04-22 DIAGNOSIS — F0280 Dementia in other diseases classified elsewhere without behavioral disturbance: Secondary | ICD-10-CM | POA: Diagnosis not present

## 2020-04-23 DIAGNOSIS — F0150 Vascular dementia without behavioral disturbance: Secondary | ICD-10-CM | POA: Diagnosis not present

## 2020-04-23 DIAGNOSIS — F0280 Dementia in other diseases classified elsewhere without behavioral disturbance: Secondary | ICD-10-CM | POA: Diagnosis not present

## 2020-04-23 DIAGNOSIS — I350 Nonrheumatic aortic (valve) stenosis: Secondary | ICD-10-CM | POA: Diagnosis not present

## 2020-04-23 DIAGNOSIS — G309 Alzheimer's disease, unspecified: Secondary | ICD-10-CM | POA: Diagnosis not present

## 2020-04-23 DIAGNOSIS — I739 Peripheral vascular disease, unspecified: Secondary | ICD-10-CM | POA: Diagnosis not present

## 2020-04-23 DIAGNOSIS — I69318 Other symptoms and signs involving cognitive functions following cerebral infarction: Secondary | ICD-10-CM | POA: Diagnosis not present

## 2020-04-24 DIAGNOSIS — I350 Nonrheumatic aortic (valve) stenosis: Secondary | ICD-10-CM | POA: Diagnosis not present

## 2020-04-24 DIAGNOSIS — I739 Peripheral vascular disease, unspecified: Secondary | ICD-10-CM | POA: Diagnosis not present

## 2020-04-24 DIAGNOSIS — F0280 Dementia in other diseases classified elsewhere without behavioral disturbance: Secondary | ICD-10-CM | POA: Diagnosis not present

## 2020-04-24 DIAGNOSIS — I69318 Other symptoms and signs involving cognitive functions following cerebral infarction: Secondary | ICD-10-CM | POA: Diagnosis not present

## 2020-04-24 DIAGNOSIS — G309 Alzheimer's disease, unspecified: Secondary | ICD-10-CM | POA: Diagnosis not present

## 2020-04-24 DIAGNOSIS — F0150 Vascular dementia without behavioral disturbance: Secondary | ICD-10-CM | POA: Diagnosis not present

## 2020-04-26 DIAGNOSIS — F0150 Vascular dementia without behavioral disturbance: Secondary | ICD-10-CM | POA: Diagnosis not present

## 2020-04-26 DIAGNOSIS — I739 Peripheral vascular disease, unspecified: Secondary | ICD-10-CM | POA: Diagnosis not present

## 2020-04-26 DIAGNOSIS — I69318 Other symptoms and signs involving cognitive functions following cerebral infarction: Secondary | ICD-10-CM | POA: Diagnosis not present

## 2020-04-26 DIAGNOSIS — F0280 Dementia in other diseases classified elsewhere without behavioral disturbance: Secondary | ICD-10-CM | POA: Diagnosis not present

## 2020-04-26 DIAGNOSIS — G309 Alzheimer's disease, unspecified: Secondary | ICD-10-CM | POA: Diagnosis not present

## 2020-04-26 DIAGNOSIS — I350 Nonrheumatic aortic (valve) stenosis: Secondary | ICD-10-CM | POA: Diagnosis not present

## 2020-04-28 IMAGING — CR DG PELVIS 1-2V
1 series · 1 of 1 positions shown · non-contrast
Comparison: February 05, 2015

CLINICAL DATA: Pain following fall

EXAM:
PELVIS - 1-2 VIEW

[pelvis ap]
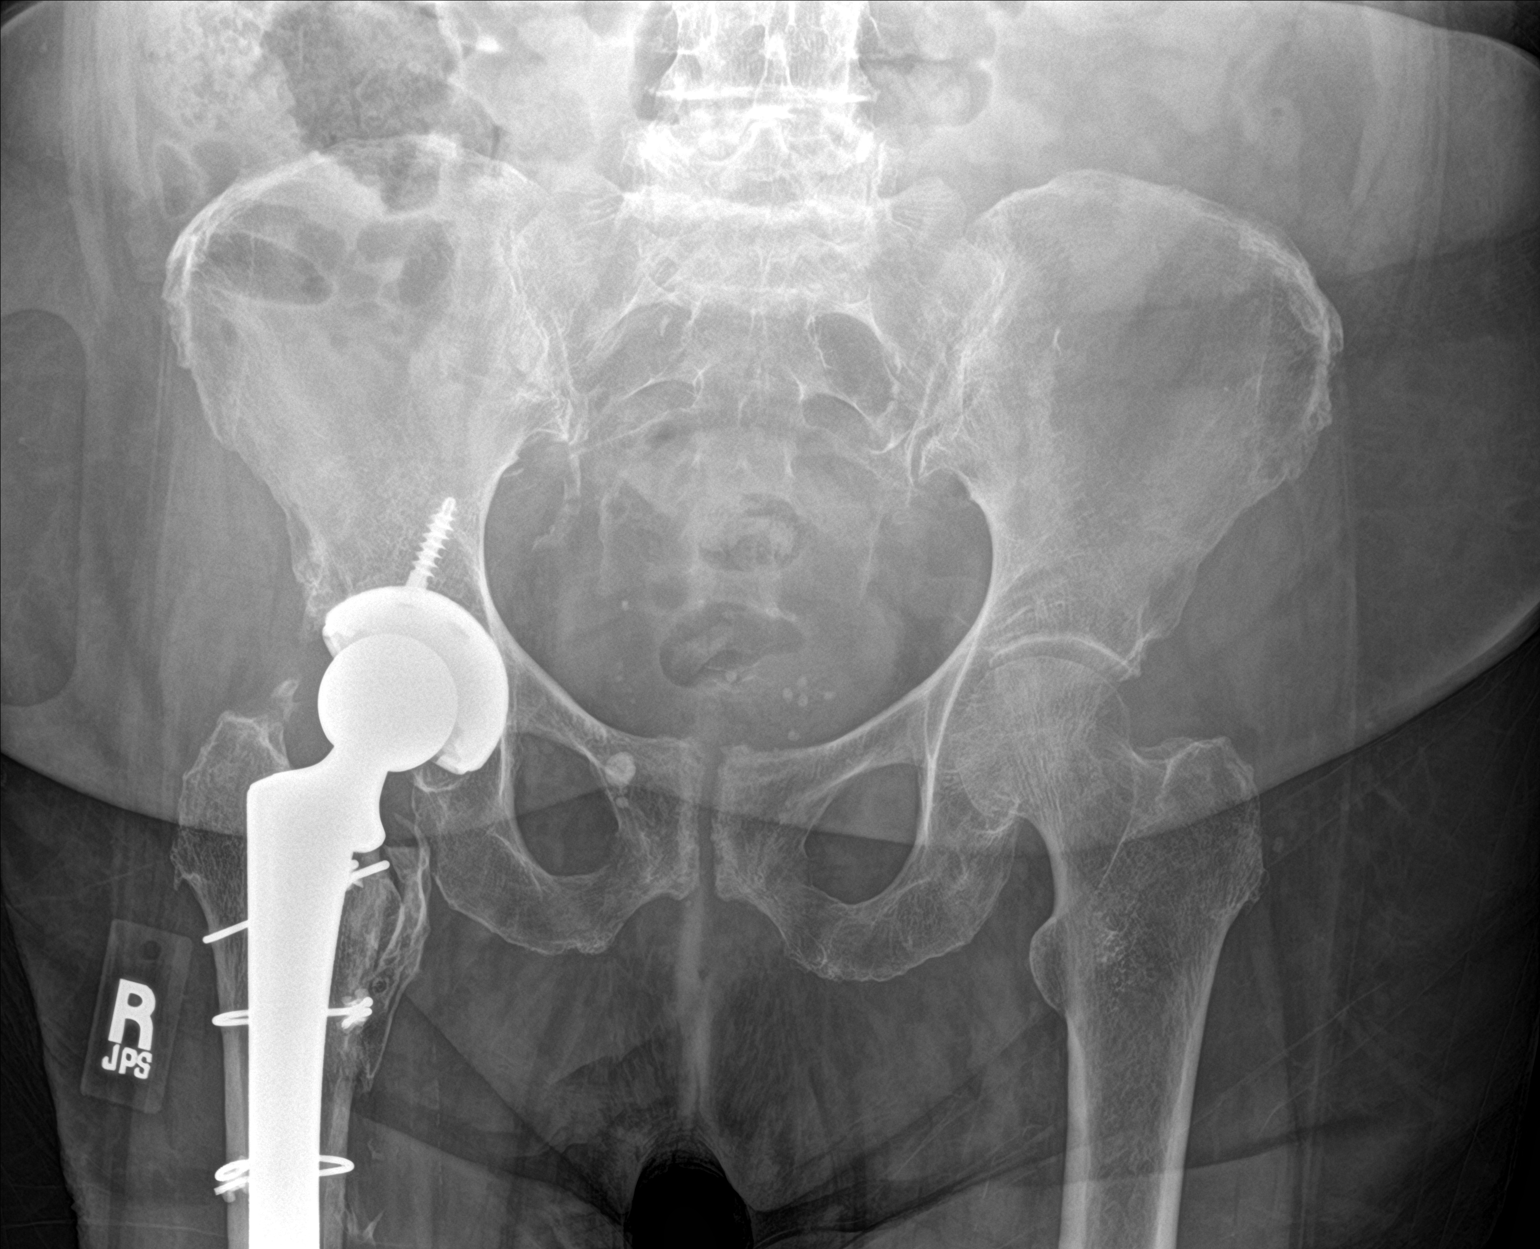

[1 of 1 positions shown; findings below may reference images not displayed]

FINDINGS: There is a total hip prosthesis on the right which has been
reinforced in the proximal femoral region. Prosthetic components
appear well seated. There has been bony overgrowth along the medial
proximal right femur since prior study.

No acute fracture or dislocation is evident. There is moderate
narrowing of the left hip joint. Bones are diffusely osteoporotic.
IMPRESSION: Prior trauma on the right with reinforced total hip prosthesis on
the right. Prosthetic components well-seated. No acute fracture or
dislocation. There is narrowing of the left hip joint. Bones are
osteoporotic.

## 2020-04-28 IMAGING — CT CT HEAD W/O CM
4 series · 16 of 47 positions shown, 18 images · non-contrast
Comparison: Head CT scan 06/27/2017.  Brain MRI 03/02/2016.

CLINICAL DATA: Status post face forward fall onto carpet today
trying to answer the phone. Initial encounter.

EXAM:
CT HEAD WITHOUT CONTRAST
TECHNIQUE: Contiguous axial images were obtained from the base of the skull
through the vertex without intravenous contrast.

[Series 3: head wo · axial · 0.43mm/px · z∈[-116,-1]mm · 7 of 31 slices shown, 9 images]
[im 4/31  brain]
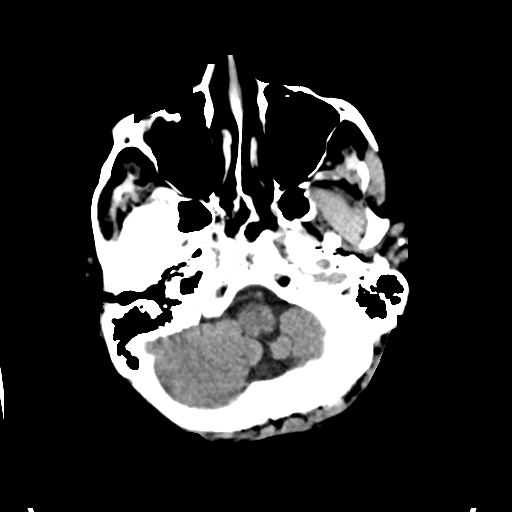
[im 4/31  bone]
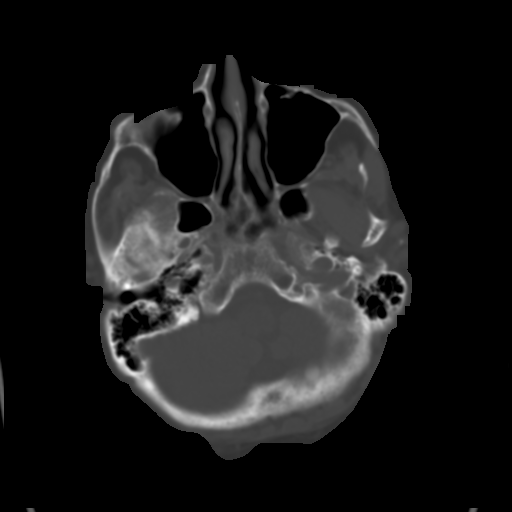
[im 8/31  brain]
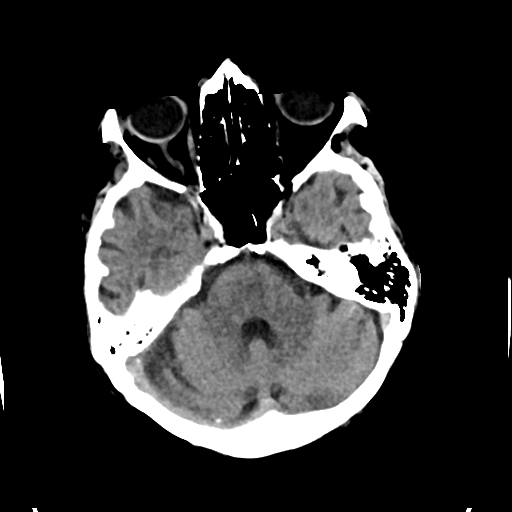
[im 12/31  brain]
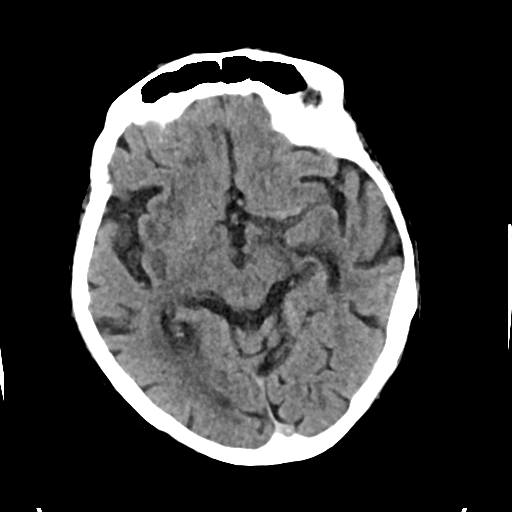
[im 16/31  brain]
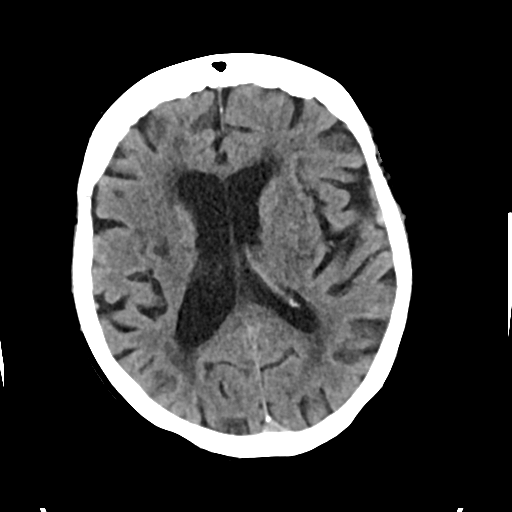
[im 19/31  brain]
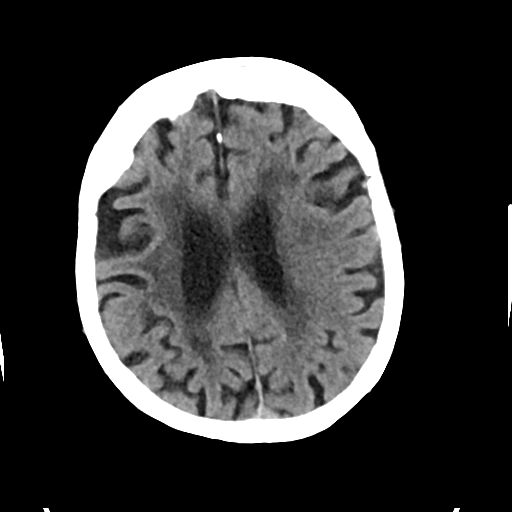
[im 19/31  bone]
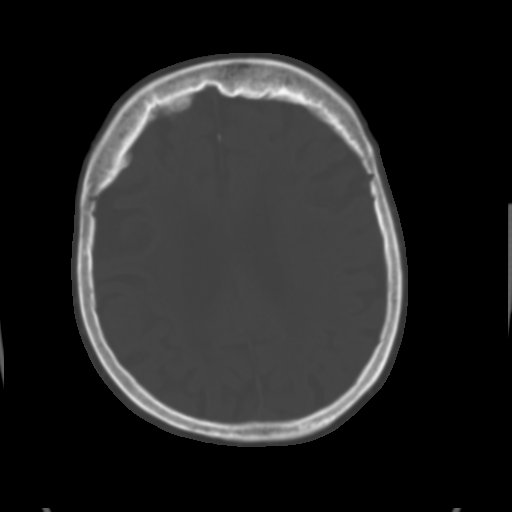
[im 23/31  brain]
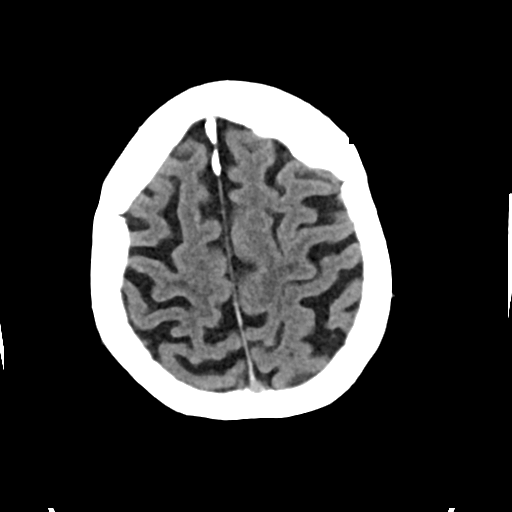
[im 27/31  brain]
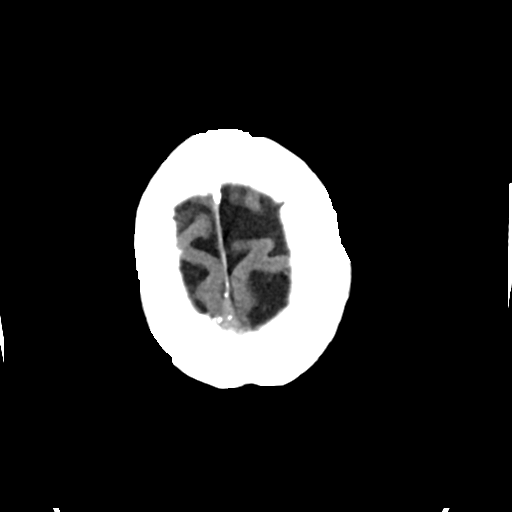

[Series 4: head bone · axial · 0.43mm/px · z∈[-117,-85]mm · 3 of 78 slices shown]
[im 8/78  bone]
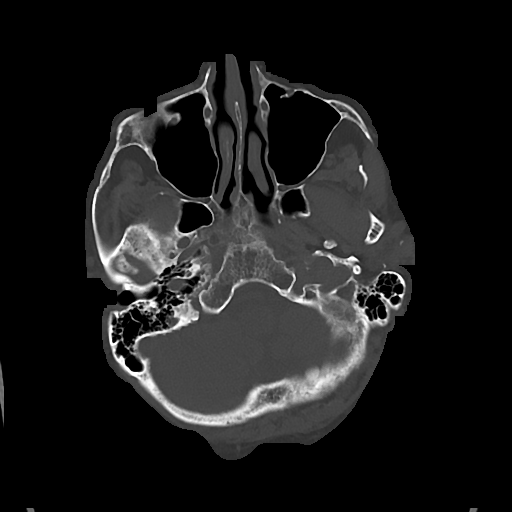
[im 16/78  bone]
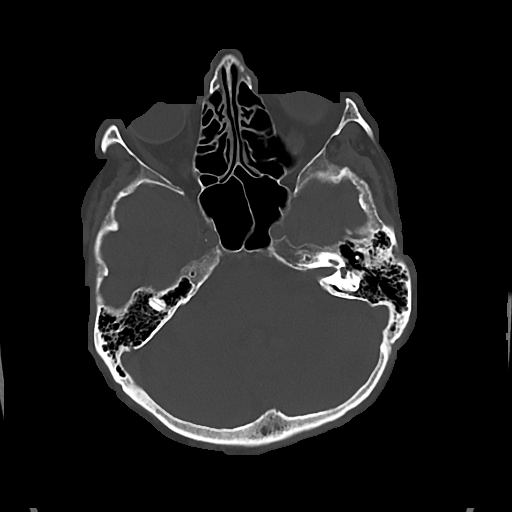
[im 24/78  bone]
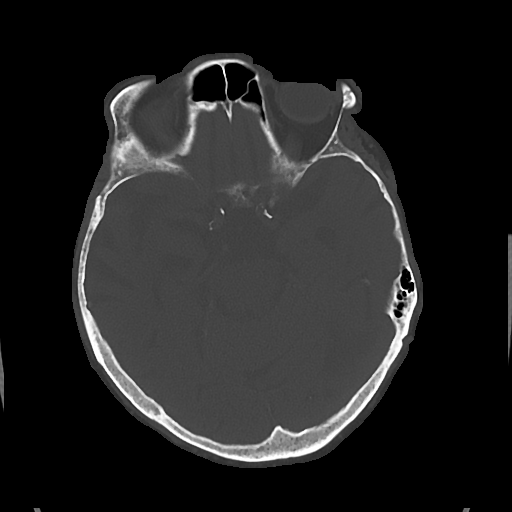

[Series 5: cor soft · coronal · 0.30mm/px · 3 of 67 slices shown]
[im 23/67  brain]
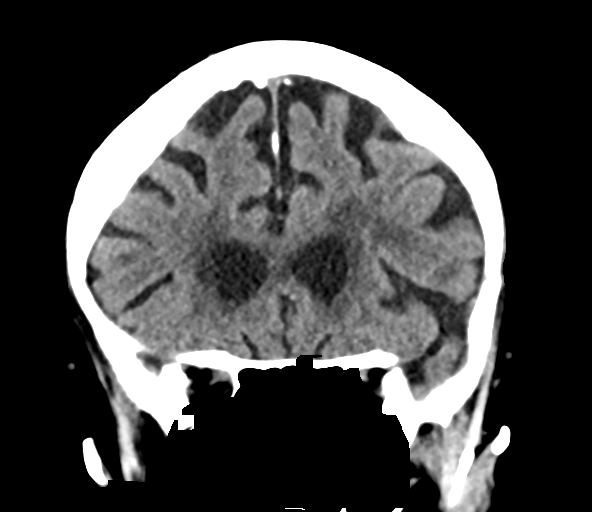
[im 30/67  brain]
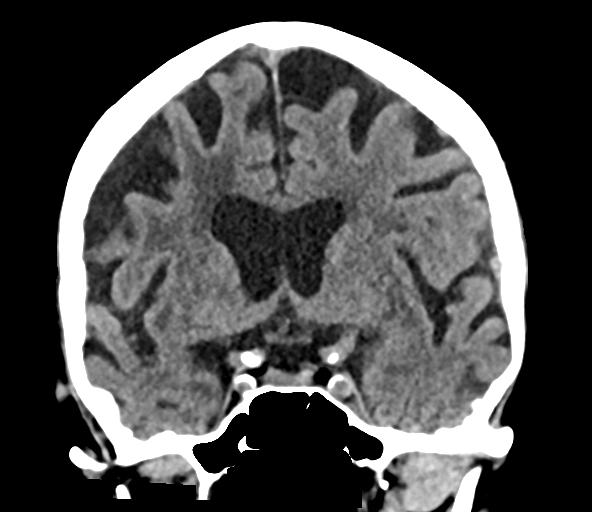
[im 37/67  brain]
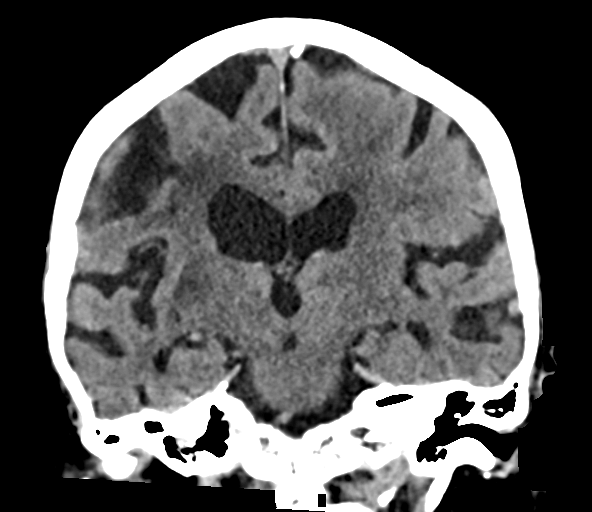

[Series 6: sag soft · sagittal · 0.30mm/px · 3 of 61 slices shown]
[im 21/61  brain]
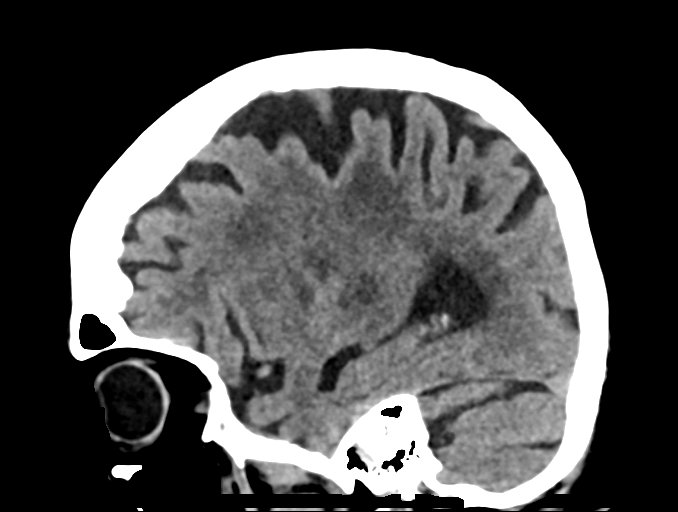
[im 31/61  brain]
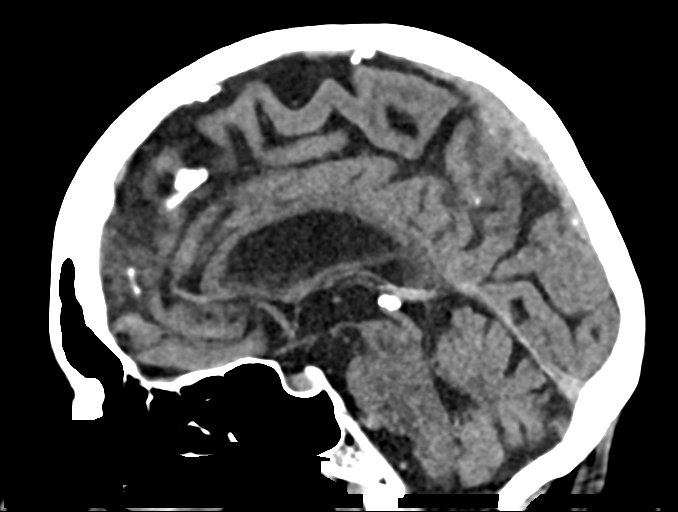
[im 41/61  brain]
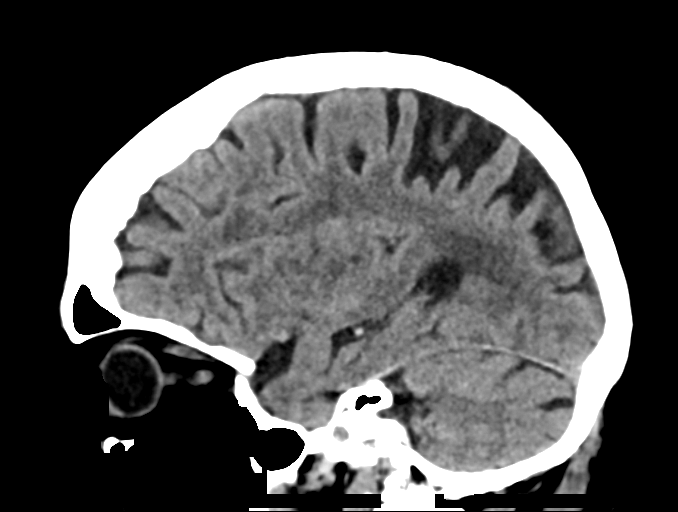

[16 of 47 positions shown; findings below may reference images not displayed]

FINDINGS: Brain: No evidence of acute infarction, hemorrhage, hydrocephalus,
extra-axial collection or mass lesion/mass effect. There is atrophy
and chronic microvascular ischemic change.

Vascular: No hyperdense vessel or unexpected calcification.

Skull: Intact.  No focal lesion.

Sinuses/Orbits: Negative.

Other: None.
IMPRESSION: No acute abnormality.

No change in atrophy and chronic microvascular ischemic disease.

## 2020-04-28 IMAGING — DX DG KNEE COMPLETE 4+V*R*
4 series · 4 of 4 positions shown · non-contrast
Comparison: 09/06/2015

CLINICAL DATA: Right knee pain secondary to a fall this morning.

EXAM:
RIGHT KNEE - COMPLETE 4+ VIEW

[knee ap]
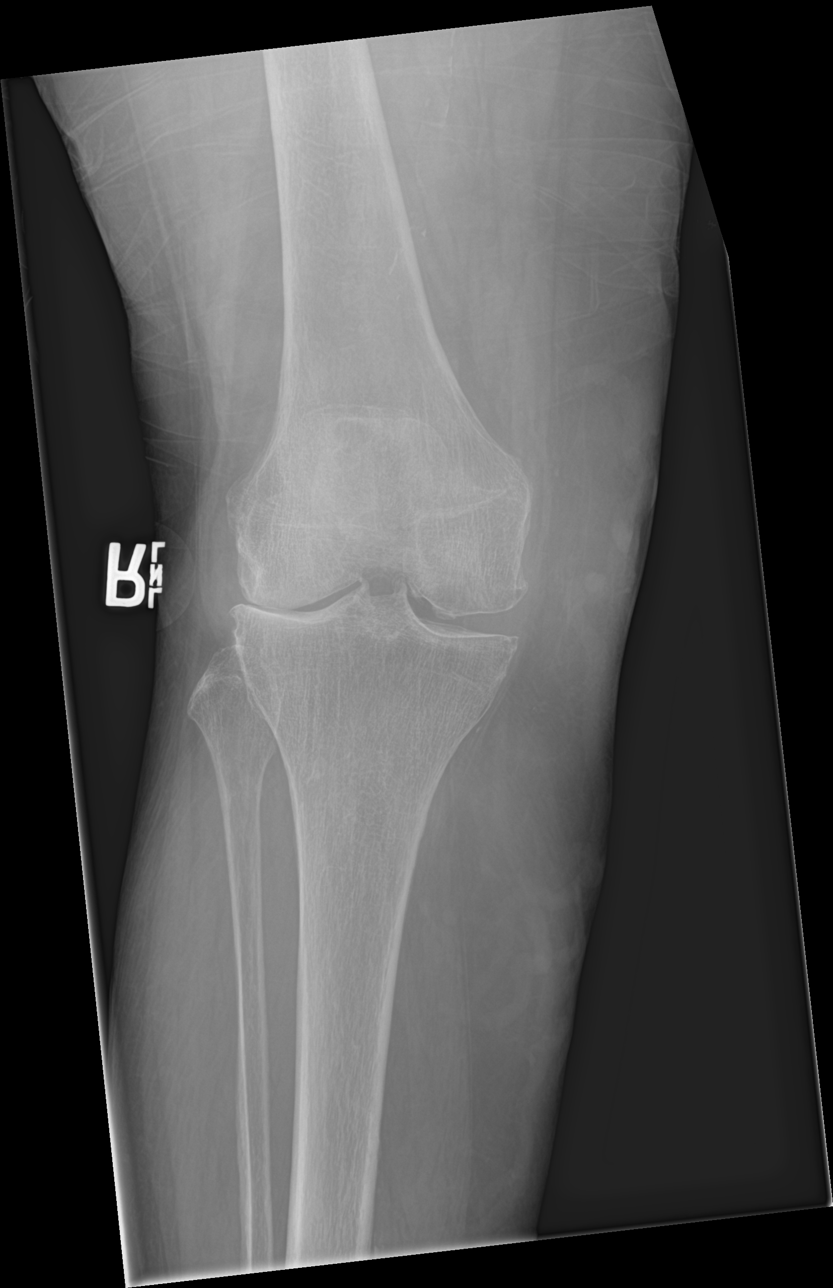

[knee lat]
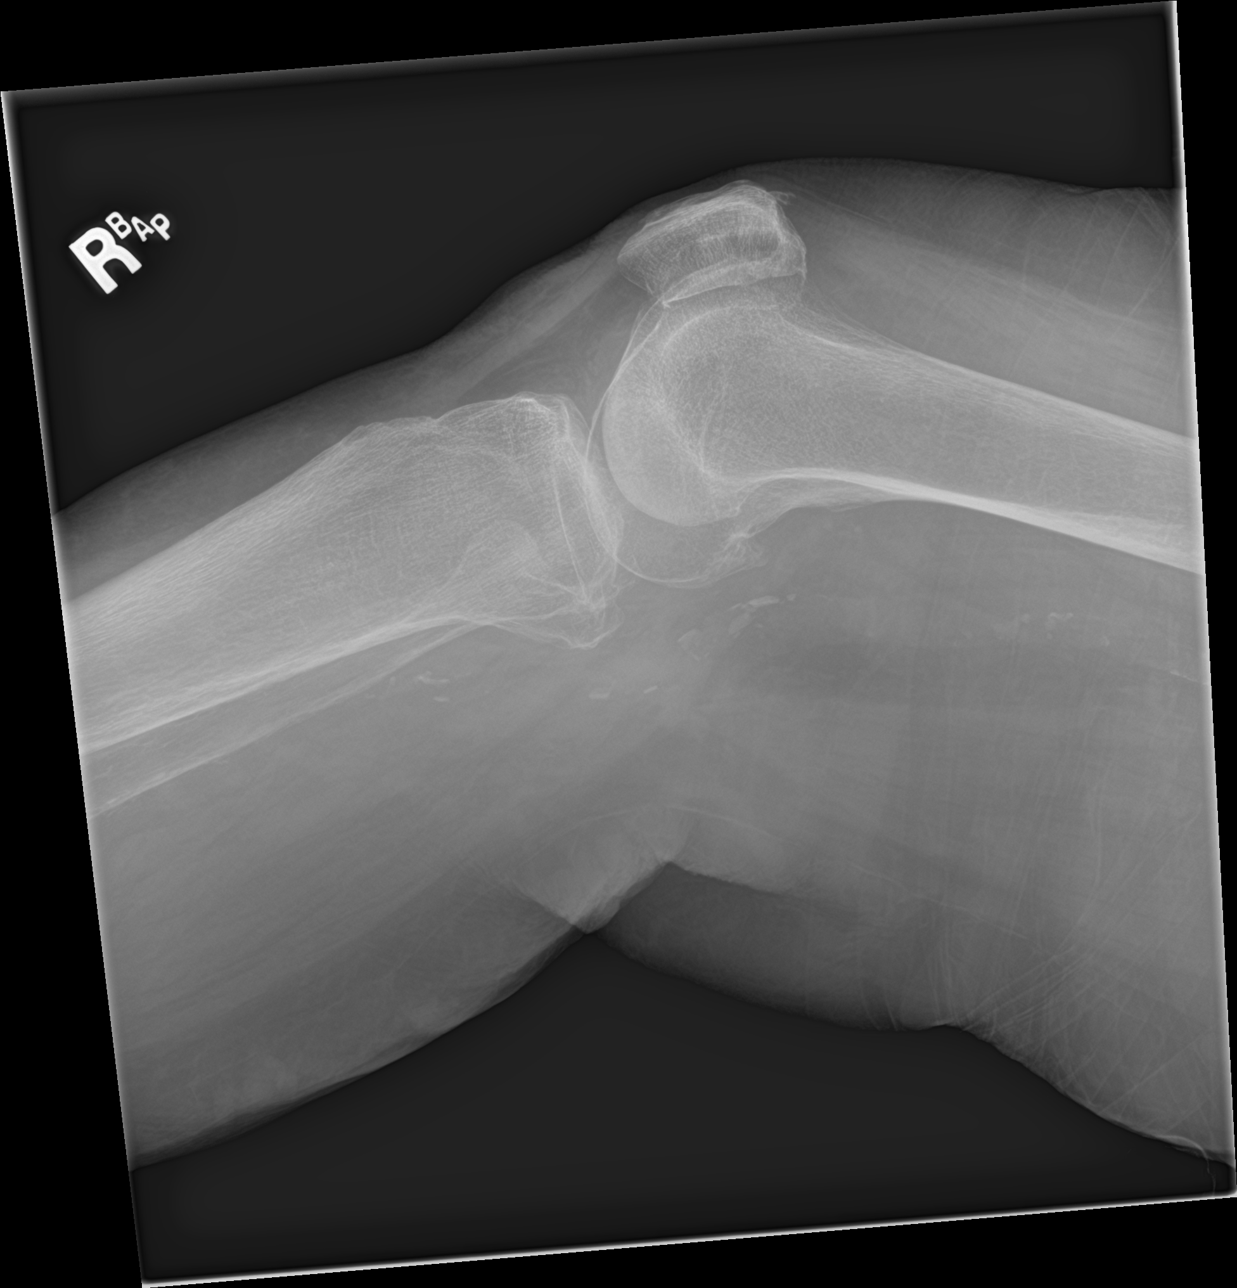

[knee obl (1 of 2)]
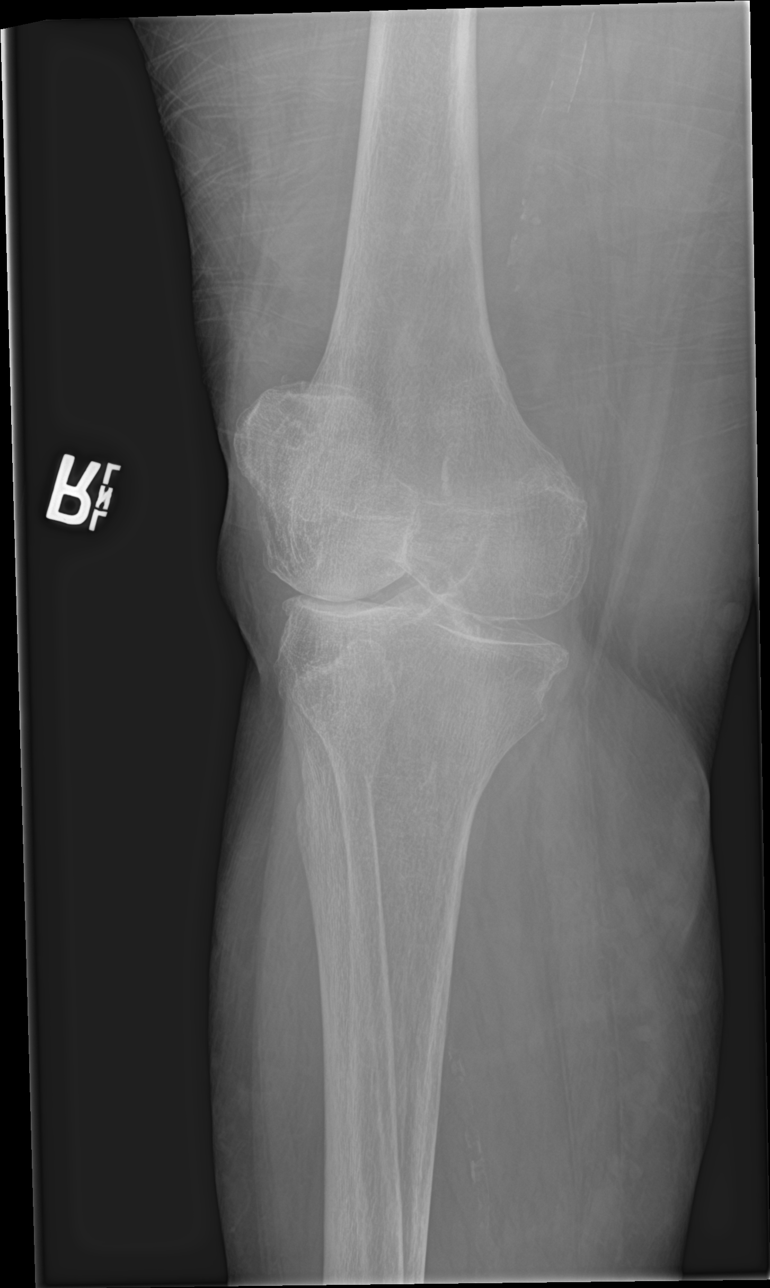

[knee obl (2 of 2)]
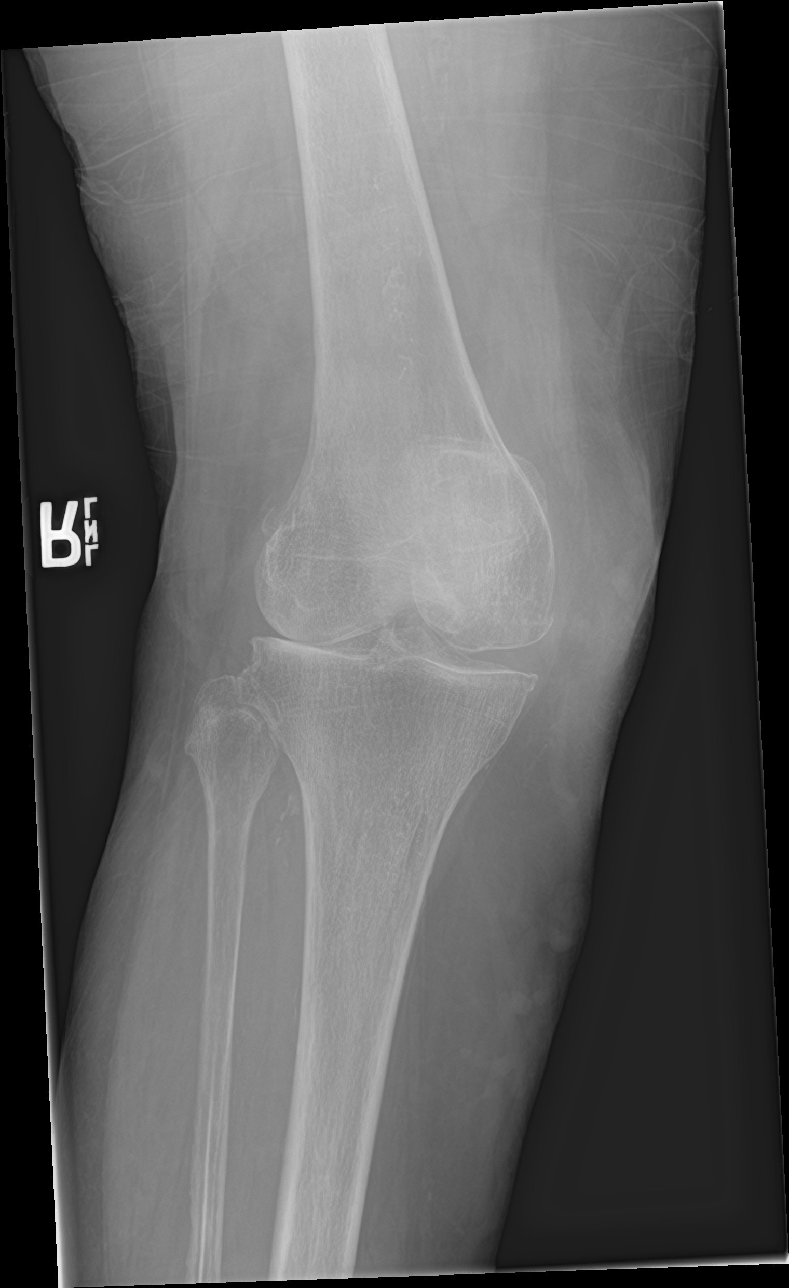

[4 of 4 positions shown; findings below may reference images not displayed]

FINDINGS: There is no fracture or dislocation or joint effusion. There is
marked lateral joint space narrowing with valgus deformity. Small
marginal osteophytes in all 3 compartments. Vascular calcifications
around the knee.
IMPRESSION: No acute abnormality. Arthritic changes, most severe in the lateral
compartment.

## 2020-04-28 IMAGING — CR DG RIBS W/ CHEST 3+V*L*
3 series · 3 of 3 positions shown · non-contrast
Comparison: Chest radiograph June 27, 2017

CLINICAL DATA: Pain following fall

EXAM:
LEFT RIBS AND CHEST - 3+ VIEW

[rib ap]
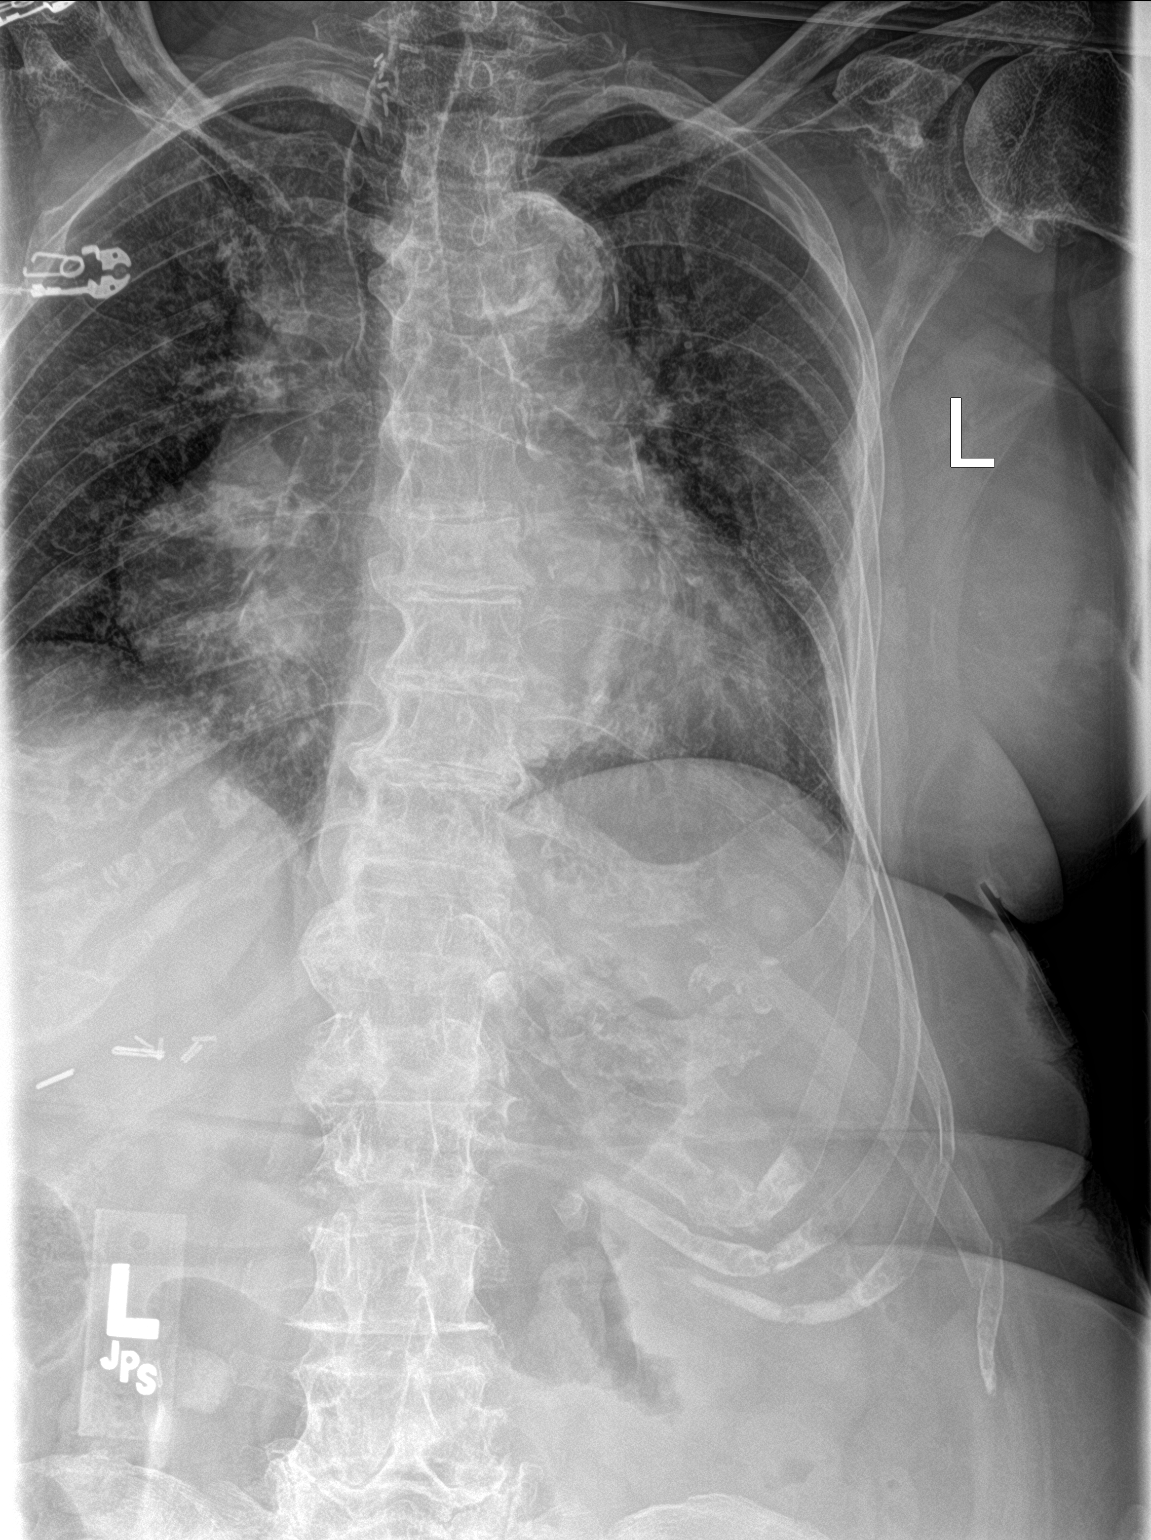

[chest ap]
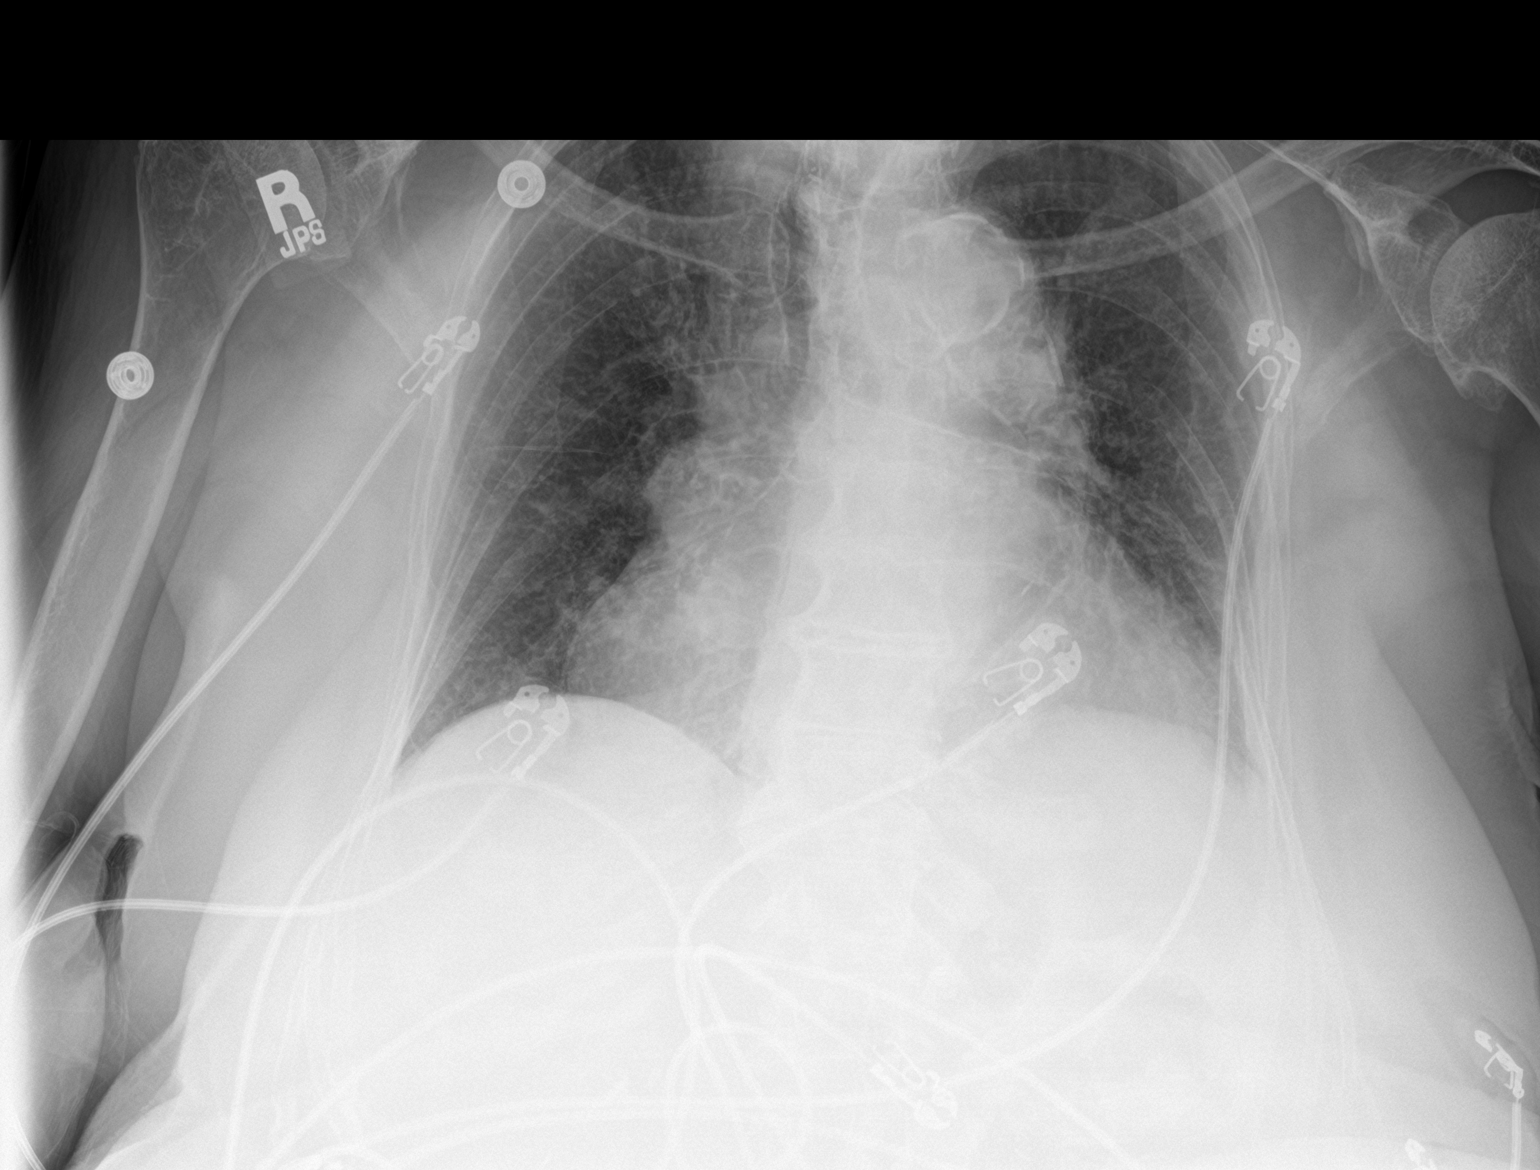

[rib ap obl]
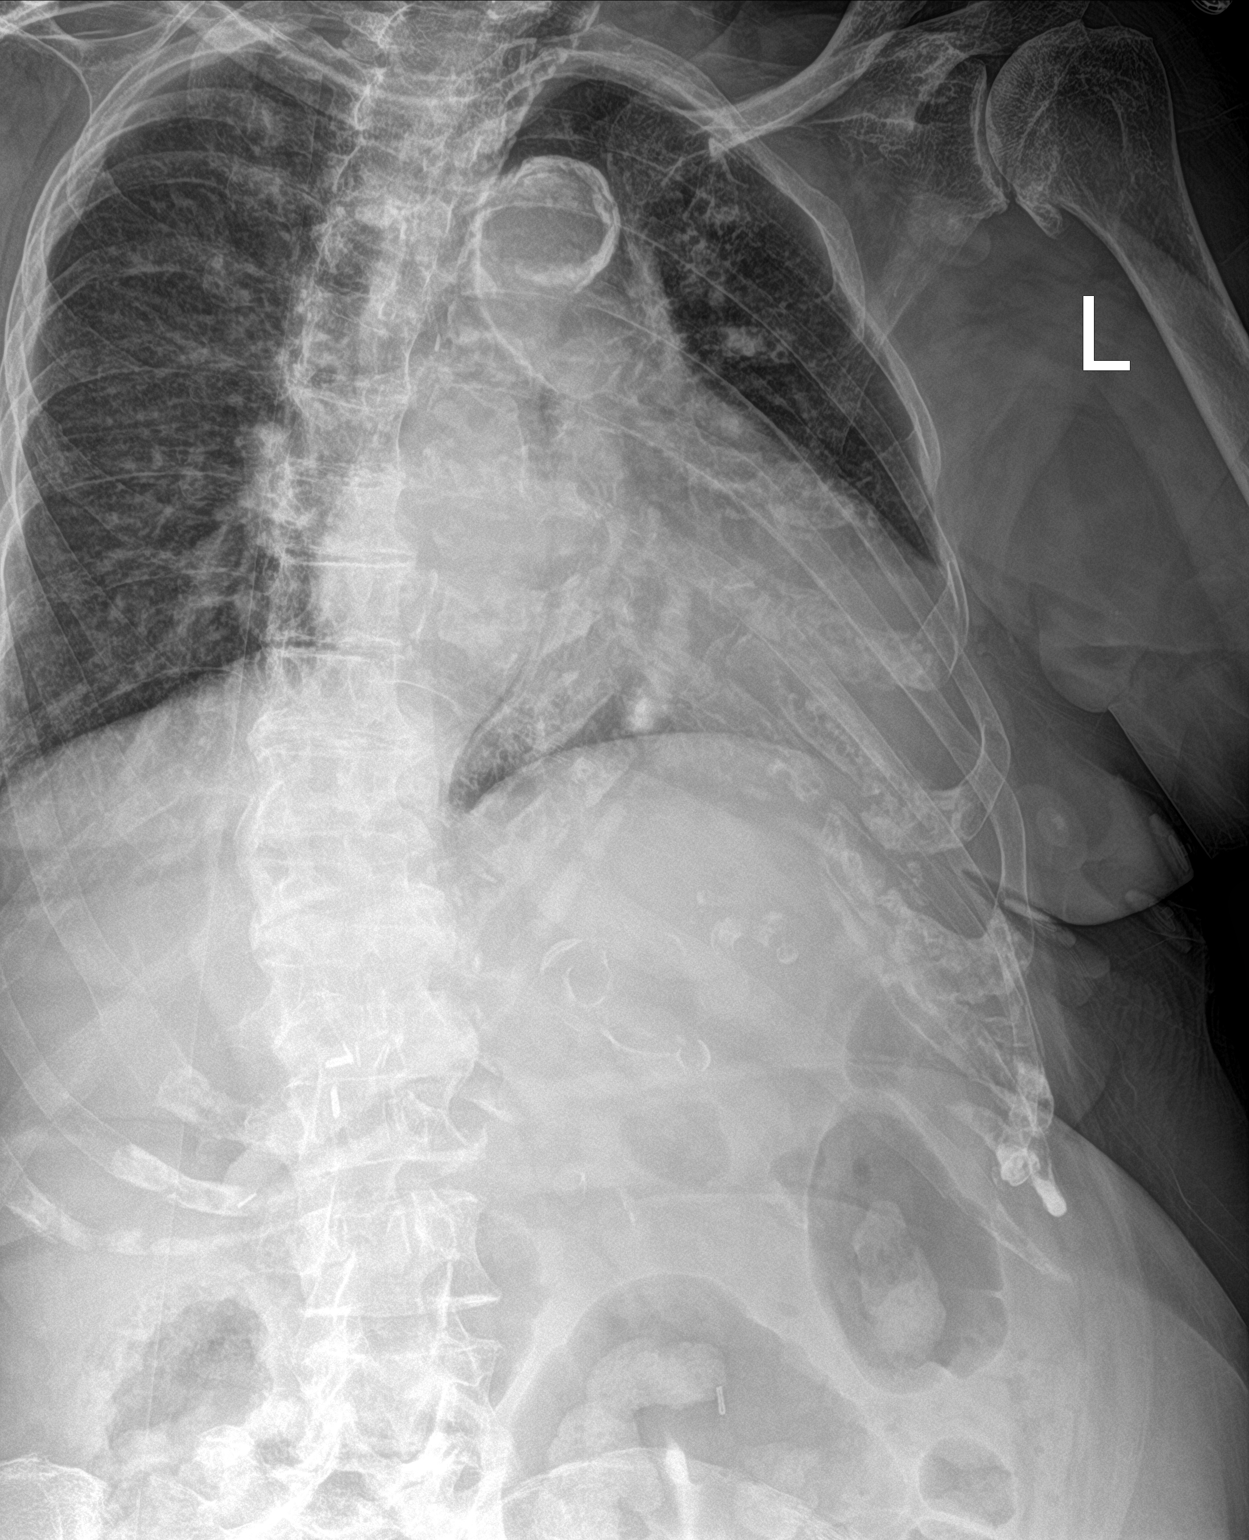

[3 of 3 positions shown; findings below may reference images not displayed]

FINDINGS: Frontal chest as well as oblique and cone-down rib images were
obtained. There is no edema or consolidation. There is cardiomegaly
with pulmonary vascularity normal. No adenopathy. There is aortic
atherosclerosis. There are surgical clips at the right
cervical-thoracic junction.

Bones are osteoporotic. There is no pneumothorax or pleural effusion
evident. There is a fracture, essentially nondisplaced, the anterior
left ninth rib. No other fracture evident. Bones are osteoporotic.
There is degenerative change in the shoulders.
IMPRESSION: Nondisplaced fracture anterior left ninth rib. Bones are
osteoporotic. No pneumothorax or effusion.

No edema or consolidation. Heart is mildly enlarged but stable.
There is aortic atherosclerosis.

Aortic Atherosclerosis ([id number]).

## 2020-04-29 DIAGNOSIS — F0280 Dementia in other diseases classified elsewhere without behavioral disturbance: Secondary | ICD-10-CM | POA: Diagnosis not present

## 2020-04-29 DIAGNOSIS — I350 Nonrheumatic aortic (valve) stenosis: Secondary | ICD-10-CM | POA: Diagnosis not present

## 2020-04-29 DIAGNOSIS — I739 Peripheral vascular disease, unspecified: Secondary | ICD-10-CM | POA: Diagnosis not present

## 2020-04-29 DIAGNOSIS — I69318 Other symptoms and signs involving cognitive functions following cerebral infarction: Secondary | ICD-10-CM | POA: Diagnosis not present

## 2020-04-29 DIAGNOSIS — F0150 Vascular dementia without behavioral disturbance: Secondary | ICD-10-CM | POA: Diagnosis not present

## 2020-04-29 DIAGNOSIS — G309 Alzheimer's disease, unspecified: Secondary | ICD-10-CM | POA: Diagnosis not present

## 2020-05-03 DIAGNOSIS — G309 Alzheimer's disease, unspecified: Secondary | ICD-10-CM | POA: Diagnosis not present

## 2020-05-03 DIAGNOSIS — F0150 Vascular dementia without behavioral disturbance: Secondary | ICD-10-CM | POA: Diagnosis not present

## 2020-05-03 DIAGNOSIS — I739 Peripheral vascular disease, unspecified: Secondary | ICD-10-CM | POA: Diagnosis not present

## 2020-05-03 DIAGNOSIS — I350 Nonrheumatic aortic (valve) stenosis: Secondary | ICD-10-CM | POA: Diagnosis not present

## 2020-05-03 DIAGNOSIS — F0280 Dementia in other diseases classified elsewhere without behavioral disturbance: Secondary | ICD-10-CM | POA: Diagnosis not present

## 2020-05-03 DIAGNOSIS — I69318 Other symptoms and signs involving cognitive functions following cerebral infarction: Secondary | ICD-10-CM | POA: Diagnosis not present

## 2020-05-06 DIAGNOSIS — F0280 Dementia in other diseases classified elsewhere without behavioral disturbance: Secondary | ICD-10-CM | POA: Diagnosis not present

## 2020-05-06 DIAGNOSIS — F0150 Vascular dementia without behavioral disturbance: Secondary | ICD-10-CM | POA: Diagnosis not present

## 2020-05-06 DIAGNOSIS — I739 Peripheral vascular disease, unspecified: Secondary | ICD-10-CM | POA: Diagnosis not present

## 2020-05-06 DIAGNOSIS — I350 Nonrheumatic aortic (valve) stenosis: Secondary | ICD-10-CM | POA: Diagnosis not present

## 2020-05-06 DIAGNOSIS — I69318 Other symptoms and signs involving cognitive functions following cerebral infarction: Secondary | ICD-10-CM | POA: Diagnosis not present

## 2020-05-06 DIAGNOSIS — G309 Alzheimer's disease, unspecified: Secondary | ICD-10-CM | POA: Diagnosis not present

## 2020-05-09 DIAGNOSIS — F0280 Dementia in other diseases classified elsewhere without behavioral disturbance: Secondary | ICD-10-CM | POA: Diagnosis not present

## 2020-05-09 DIAGNOSIS — G309 Alzheimer's disease, unspecified: Secondary | ICD-10-CM | POA: Diagnosis not present

## 2020-05-09 DIAGNOSIS — I739 Peripheral vascular disease, unspecified: Secondary | ICD-10-CM | POA: Diagnosis not present

## 2020-05-09 DIAGNOSIS — I350 Nonrheumatic aortic (valve) stenosis: Secondary | ICD-10-CM | POA: Diagnosis not present

## 2020-05-09 DIAGNOSIS — F0150 Vascular dementia without behavioral disturbance: Secondary | ICD-10-CM | POA: Diagnosis not present

## 2020-05-09 DIAGNOSIS — I69318 Other symptoms and signs involving cognitive functions following cerebral infarction: Secondary | ICD-10-CM | POA: Diagnosis not present

## 2020-05-10 DIAGNOSIS — G309 Alzheimer's disease, unspecified: Secondary | ICD-10-CM | POA: Diagnosis not present

## 2020-05-10 DIAGNOSIS — I350 Nonrheumatic aortic (valve) stenosis: Secondary | ICD-10-CM | POA: Diagnosis not present

## 2020-05-10 DIAGNOSIS — F0150 Vascular dementia without behavioral disturbance: Secondary | ICD-10-CM | POA: Diagnosis not present

## 2020-05-10 DIAGNOSIS — F0280 Dementia in other diseases classified elsewhere without behavioral disturbance: Secondary | ICD-10-CM | POA: Diagnosis not present

## 2020-05-10 DIAGNOSIS — I69318 Other symptoms and signs involving cognitive functions following cerebral infarction: Secondary | ICD-10-CM | POA: Diagnosis not present

## 2020-05-10 DIAGNOSIS — I739 Peripheral vascular disease, unspecified: Secondary | ICD-10-CM | POA: Diagnosis not present

## 2020-05-13 DIAGNOSIS — I739 Peripheral vascular disease, unspecified: Secondary | ICD-10-CM | POA: Diagnosis not present

## 2020-05-13 DIAGNOSIS — N399 Disorder of urinary system, unspecified: Secondary | ICD-10-CM | POA: Diagnosis not present

## 2020-05-13 DIAGNOSIS — I69318 Other symptoms and signs involving cognitive functions following cerebral infarction: Secondary | ICD-10-CM | POA: Diagnosis not present

## 2020-05-13 DIAGNOSIS — G309 Alzheimer's disease, unspecified: Secondary | ICD-10-CM | POA: Diagnosis not present

## 2020-05-13 DIAGNOSIS — I350 Nonrheumatic aortic (valve) stenosis: Secondary | ICD-10-CM | POA: Diagnosis not present

## 2020-05-13 DIAGNOSIS — F0280 Dementia in other diseases classified elsewhere without behavioral disturbance: Secondary | ICD-10-CM | POA: Diagnosis not present

## 2020-05-13 DIAGNOSIS — F0150 Vascular dementia without behavioral disturbance: Secondary | ICD-10-CM | POA: Diagnosis not present

## 2020-05-15 DIAGNOSIS — I69318 Other symptoms and signs involving cognitive functions following cerebral infarction: Secondary | ICD-10-CM | POA: Diagnosis not present

## 2020-05-15 DIAGNOSIS — G309 Alzheimer's disease, unspecified: Secondary | ICD-10-CM | POA: Diagnosis not present

## 2020-05-15 DIAGNOSIS — F0280 Dementia in other diseases classified elsewhere without behavioral disturbance: Secondary | ICD-10-CM | POA: Diagnosis not present

## 2020-05-15 DIAGNOSIS — I350 Nonrheumatic aortic (valve) stenosis: Secondary | ICD-10-CM | POA: Diagnosis not present

## 2020-05-15 DIAGNOSIS — F0150 Vascular dementia without behavioral disturbance: Secondary | ICD-10-CM | POA: Diagnosis not present

## 2020-05-15 DIAGNOSIS — I739 Peripheral vascular disease, unspecified: Secondary | ICD-10-CM | POA: Diagnosis not present

## 2020-05-17 DIAGNOSIS — I482 Chronic atrial fibrillation, unspecified: Secondary | ICD-10-CM | POA: Diagnosis not present

## 2020-05-17 DIAGNOSIS — F0280 Dementia in other diseases classified elsewhere without behavioral disturbance: Secondary | ICD-10-CM | POA: Diagnosis not present

## 2020-05-17 DIAGNOSIS — F32A Depression, unspecified: Secondary | ICD-10-CM | POA: Diagnosis not present

## 2020-05-17 DIAGNOSIS — G35 Multiple sclerosis: Secondary | ICD-10-CM | POA: Diagnosis not present

## 2020-05-17 DIAGNOSIS — Z8639 Personal history of other endocrine, nutritional and metabolic disease: Secondary | ICD-10-CM | POA: Diagnosis not present

## 2020-05-17 DIAGNOSIS — E039 Hypothyroidism, unspecified: Secondary | ICD-10-CM | POA: Diagnosis not present

## 2020-05-17 DIAGNOSIS — Z87310 Personal history of (healed) osteoporosis fracture: Secondary | ICD-10-CM | POA: Diagnosis not present

## 2020-05-17 DIAGNOSIS — I69318 Other symptoms and signs involving cognitive functions following cerebral infarction: Secondary | ICD-10-CM | POA: Diagnosis not present

## 2020-05-17 DIAGNOSIS — R32 Unspecified urinary incontinence: Secondary | ICD-10-CM | POA: Diagnosis not present

## 2020-05-17 DIAGNOSIS — E785 Hyperlipidemia, unspecified: Secondary | ICD-10-CM | POA: Diagnosis not present

## 2020-05-17 DIAGNOSIS — M810 Age-related osteoporosis without current pathological fracture: Secondary | ICD-10-CM | POA: Diagnosis not present

## 2020-05-17 DIAGNOSIS — G309 Alzheimer's disease, unspecified: Secondary | ICD-10-CM | POA: Diagnosis not present

## 2020-05-17 DIAGNOSIS — Z6825 Body mass index (BMI) 25.0-25.9, adult: Secondary | ICD-10-CM | POA: Diagnosis not present

## 2020-05-17 DIAGNOSIS — R159 Full incontinence of feces: Secondary | ICD-10-CM | POA: Diagnosis not present

## 2020-05-17 DIAGNOSIS — I739 Peripheral vascular disease, unspecified: Secondary | ICD-10-CM | POA: Diagnosis not present

## 2020-05-17 DIAGNOSIS — Z741 Need for assistance with personal care: Secondary | ICD-10-CM | POA: Diagnosis not present

## 2020-05-17 DIAGNOSIS — I10 Essential (primary) hypertension: Secondary | ICD-10-CM | POA: Diagnosis not present

## 2020-05-17 DIAGNOSIS — I350 Nonrheumatic aortic (valve) stenosis: Secondary | ICD-10-CM | POA: Diagnosis not present

## 2020-05-17 DIAGNOSIS — F0150 Vascular dementia without behavioral disturbance: Secondary | ICD-10-CM | POA: Diagnosis not present

## 2020-05-20 DIAGNOSIS — I739 Peripheral vascular disease, unspecified: Secondary | ICD-10-CM | POA: Diagnosis not present

## 2020-05-20 DIAGNOSIS — G309 Alzheimer's disease, unspecified: Secondary | ICD-10-CM | POA: Diagnosis not present

## 2020-05-20 DIAGNOSIS — I350 Nonrheumatic aortic (valve) stenosis: Secondary | ICD-10-CM | POA: Diagnosis not present

## 2020-05-20 DIAGNOSIS — I69318 Other symptoms and signs involving cognitive functions following cerebral infarction: Secondary | ICD-10-CM | POA: Diagnosis not present

## 2020-05-20 DIAGNOSIS — F0150 Vascular dementia without behavioral disturbance: Secondary | ICD-10-CM | POA: Diagnosis not present

## 2020-05-20 DIAGNOSIS — F0280 Dementia in other diseases classified elsewhere without behavioral disturbance: Secondary | ICD-10-CM | POA: Diagnosis not present

## 2020-05-21 DIAGNOSIS — F0280 Dementia in other diseases classified elsewhere without behavioral disturbance: Secondary | ICD-10-CM | POA: Diagnosis not present

## 2020-05-21 DIAGNOSIS — I69318 Other symptoms and signs involving cognitive functions following cerebral infarction: Secondary | ICD-10-CM | POA: Diagnosis not present

## 2020-05-21 DIAGNOSIS — F0150 Vascular dementia without behavioral disturbance: Secondary | ICD-10-CM | POA: Diagnosis not present

## 2020-05-21 DIAGNOSIS — G309 Alzheimer's disease, unspecified: Secondary | ICD-10-CM | POA: Diagnosis not present

## 2020-05-21 DIAGNOSIS — I350 Nonrheumatic aortic (valve) stenosis: Secondary | ICD-10-CM | POA: Diagnosis not present

## 2020-05-21 DIAGNOSIS — I739 Peripheral vascular disease, unspecified: Secondary | ICD-10-CM | POA: Diagnosis not present

## 2020-05-24 DIAGNOSIS — F0150 Vascular dementia without behavioral disturbance: Secondary | ICD-10-CM | POA: Diagnosis not present

## 2020-05-24 DIAGNOSIS — I350 Nonrheumatic aortic (valve) stenosis: Secondary | ICD-10-CM | POA: Diagnosis not present

## 2020-05-24 DIAGNOSIS — F0280 Dementia in other diseases classified elsewhere without behavioral disturbance: Secondary | ICD-10-CM | POA: Diagnosis not present

## 2020-05-24 DIAGNOSIS — G309 Alzheimer's disease, unspecified: Secondary | ICD-10-CM | POA: Diagnosis not present

## 2020-05-24 DIAGNOSIS — I739 Peripheral vascular disease, unspecified: Secondary | ICD-10-CM | POA: Diagnosis not present

## 2020-05-24 DIAGNOSIS — I69318 Other symptoms and signs involving cognitive functions following cerebral infarction: Secondary | ICD-10-CM | POA: Diagnosis not present

## 2020-05-27 DIAGNOSIS — I350 Nonrheumatic aortic (valve) stenosis: Secondary | ICD-10-CM | POA: Diagnosis not present

## 2020-05-27 DIAGNOSIS — G309 Alzheimer's disease, unspecified: Secondary | ICD-10-CM | POA: Diagnosis not present

## 2020-05-27 DIAGNOSIS — F0280 Dementia in other diseases classified elsewhere without behavioral disturbance: Secondary | ICD-10-CM | POA: Diagnosis not present

## 2020-05-27 DIAGNOSIS — I739 Peripheral vascular disease, unspecified: Secondary | ICD-10-CM | POA: Diagnosis not present

## 2020-05-27 DIAGNOSIS — F0150 Vascular dementia without behavioral disturbance: Secondary | ICD-10-CM | POA: Diagnosis not present

## 2020-05-27 DIAGNOSIS — I69318 Other symptoms and signs involving cognitive functions following cerebral infarction: Secondary | ICD-10-CM | POA: Diagnosis not present

## 2020-05-30 DIAGNOSIS — I350 Nonrheumatic aortic (valve) stenosis: Secondary | ICD-10-CM | POA: Diagnosis not present

## 2020-05-30 DIAGNOSIS — Z23 Encounter for immunization: Secondary | ICD-10-CM | POA: Diagnosis not present

## 2020-05-30 DIAGNOSIS — I69318 Other symptoms and signs involving cognitive functions following cerebral infarction: Secondary | ICD-10-CM | POA: Diagnosis not present

## 2020-05-30 DIAGNOSIS — F0280 Dementia in other diseases classified elsewhere without behavioral disturbance: Secondary | ICD-10-CM | POA: Diagnosis not present

## 2020-05-30 DIAGNOSIS — G309 Alzheimer's disease, unspecified: Secondary | ICD-10-CM | POA: Diagnosis not present

## 2020-05-30 DIAGNOSIS — I739 Peripheral vascular disease, unspecified: Secondary | ICD-10-CM | POA: Diagnosis not present

## 2020-05-30 DIAGNOSIS — F0150 Vascular dementia without behavioral disturbance: Secondary | ICD-10-CM | POA: Diagnosis not present

## 2020-05-31 DIAGNOSIS — I350 Nonrheumatic aortic (valve) stenosis: Secondary | ICD-10-CM | POA: Diagnosis not present

## 2020-05-31 DIAGNOSIS — G309 Alzheimer's disease, unspecified: Secondary | ICD-10-CM | POA: Diagnosis not present

## 2020-05-31 DIAGNOSIS — I739 Peripheral vascular disease, unspecified: Secondary | ICD-10-CM | POA: Diagnosis not present

## 2020-05-31 DIAGNOSIS — F0280 Dementia in other diseases classified elsewhere without behavioral disturbance: Secondary | ICD-10-CM | POA: Diagnosis not present

## 2020-05-31 DIAGNOSIS — F0150 Vascular dementia without behavioral disturbance: Secondary | ICD-10-CM | POA: Diagnosis not present

## 2020-05-31 DIAGNOSIS — I69318 Other symptoms and signs involving cognitive functions following cerebral infarction: Secondary | ICD-10-CM | POA: Diagnosis not present

## 2020-05-31 IMAGING — DX DG HIP (WITH OR WITHOUT PELVIS) 3-4V BILAT
6 series · 6 of 6 positions shown · non-contrast
Comparison: 03/11/2018

CLINICAL DATA: Recent fall with bilateral hip pain left greater
than right

EXAM:
DG HIP (WITH OR WITHOUT PELVIS) 3-4V BILAT

[pelvis ap]
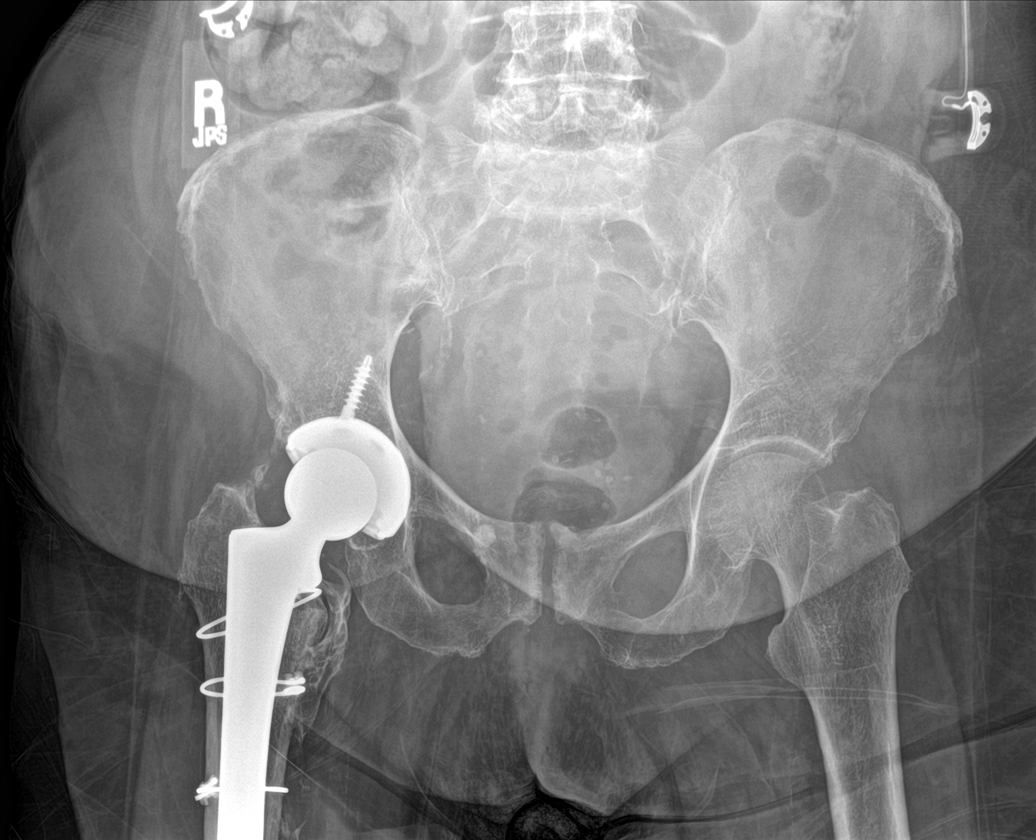

[hip ap (1 of 3)]
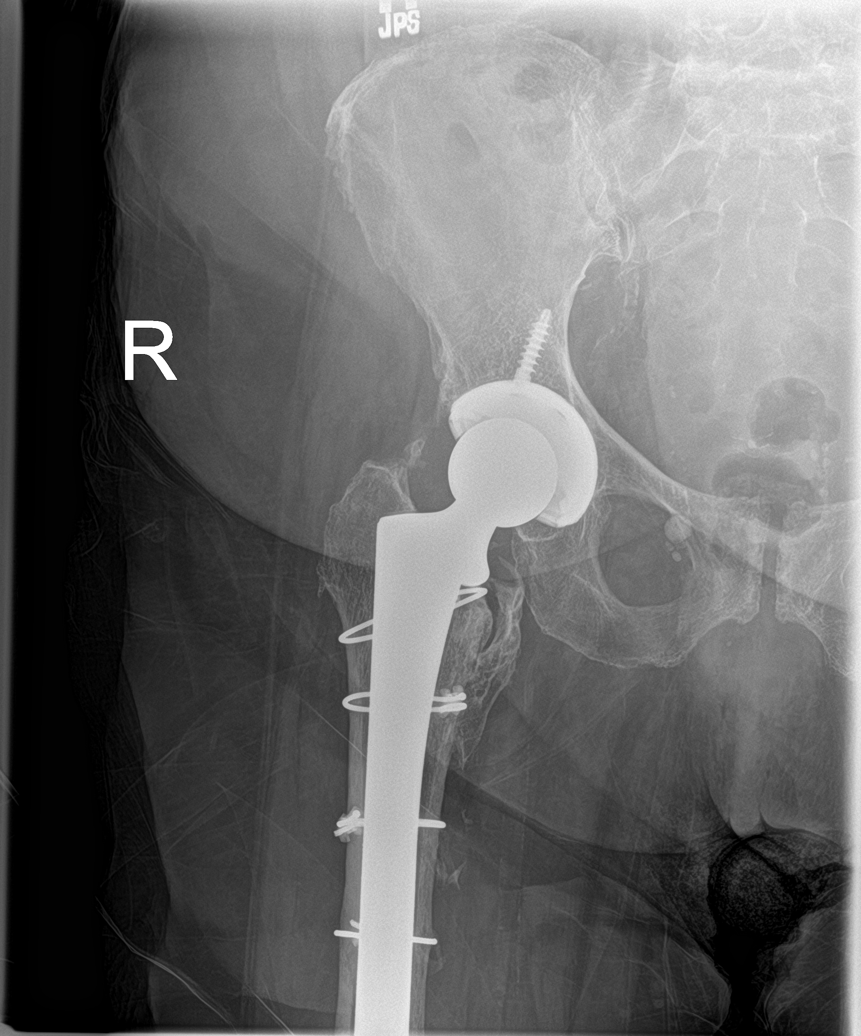

[hip lat (1 of 2)]
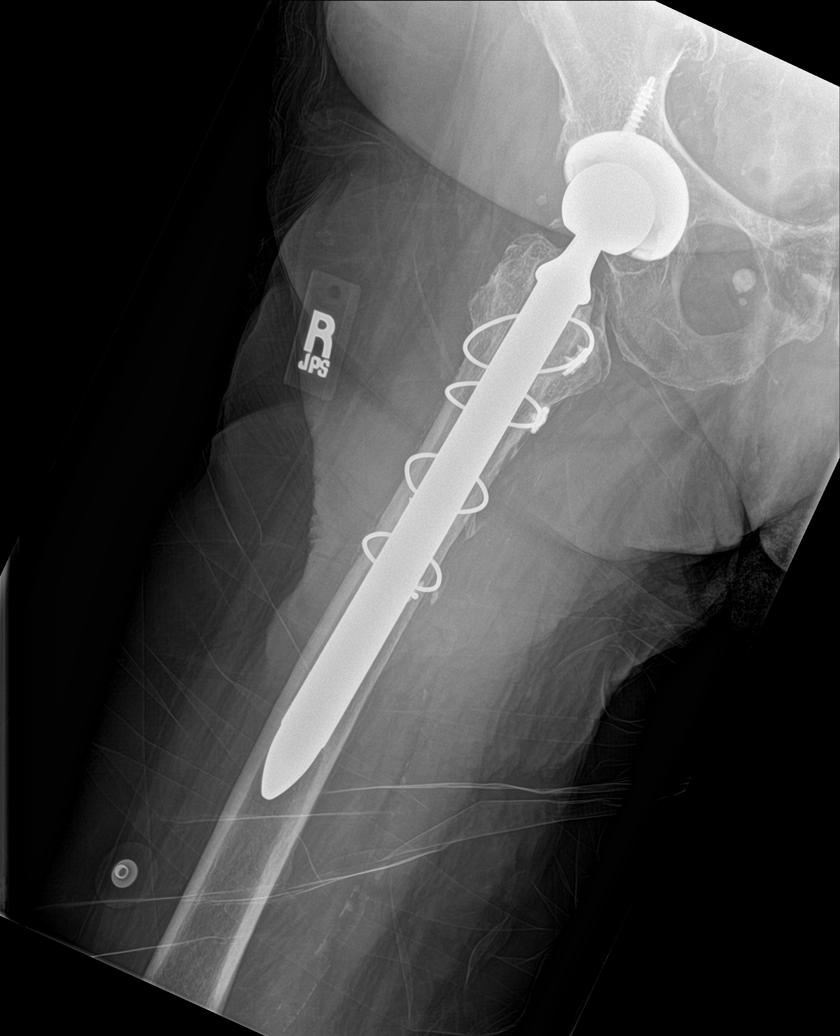

[hip ap (2 of 3)]
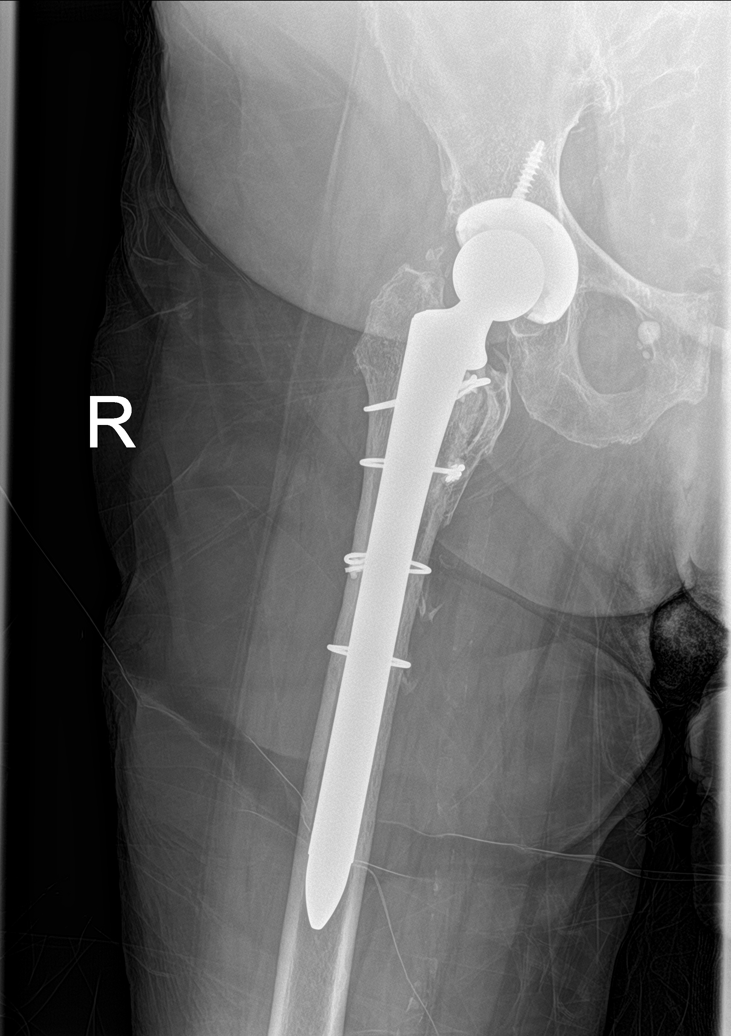

[hip lat (2 of 2)]
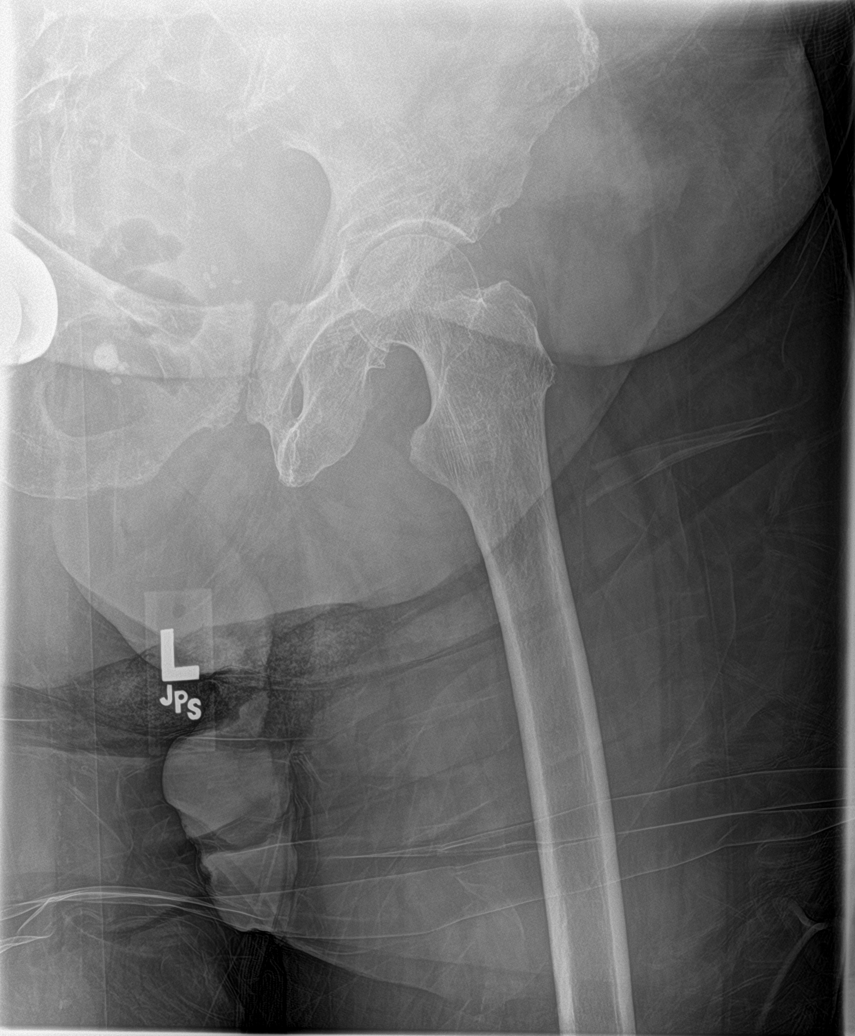

[hip ap (3 of 3)]
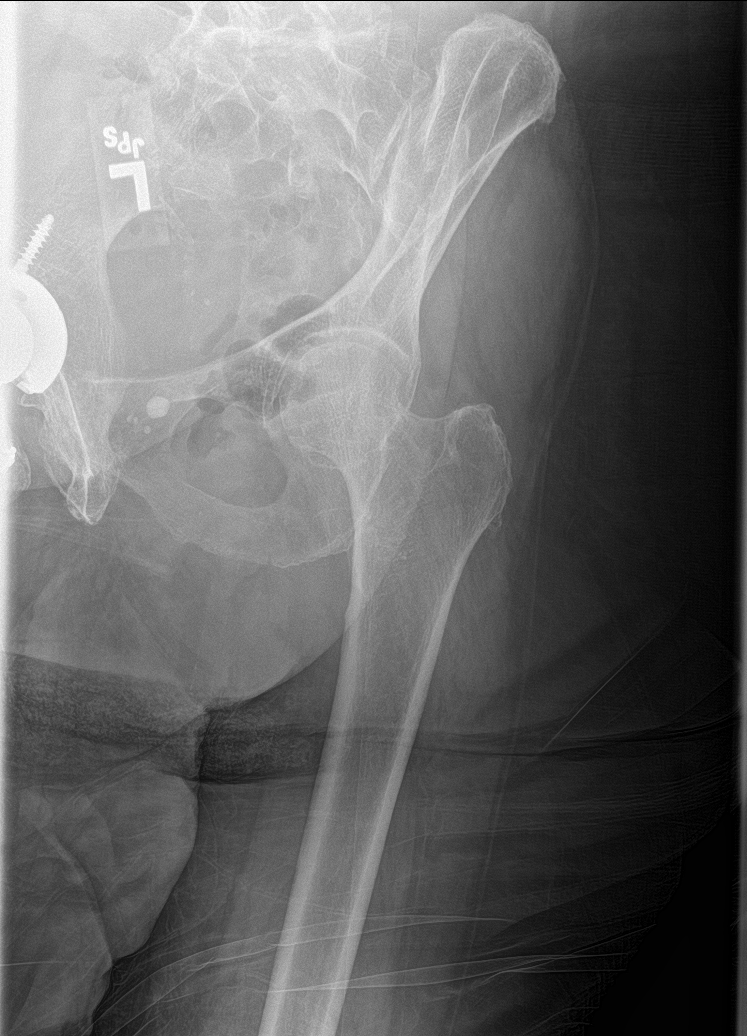

[6 of 6 positions shown; findings below may reference images not displayed]

FINDINGS: Prior right hip replacement is again identified and stable. The
pelvic ring is intact. No acute fracture or dislocation is noted. No
soft tissue changes are seen.
IMPRESSION: No acute abnormality noted.

## 2020-05-31 IMAGING — DX DG CHEST 1V
1 series · 1 of 1 positions shown · non-contrast
Comparison: 03/11/2018, 06/27/2017

CLINICAL DATA: [AGE] female with a history of fall and a left
leg deformity

EXAM:
CHEST  1 VIEW

[chest ap]
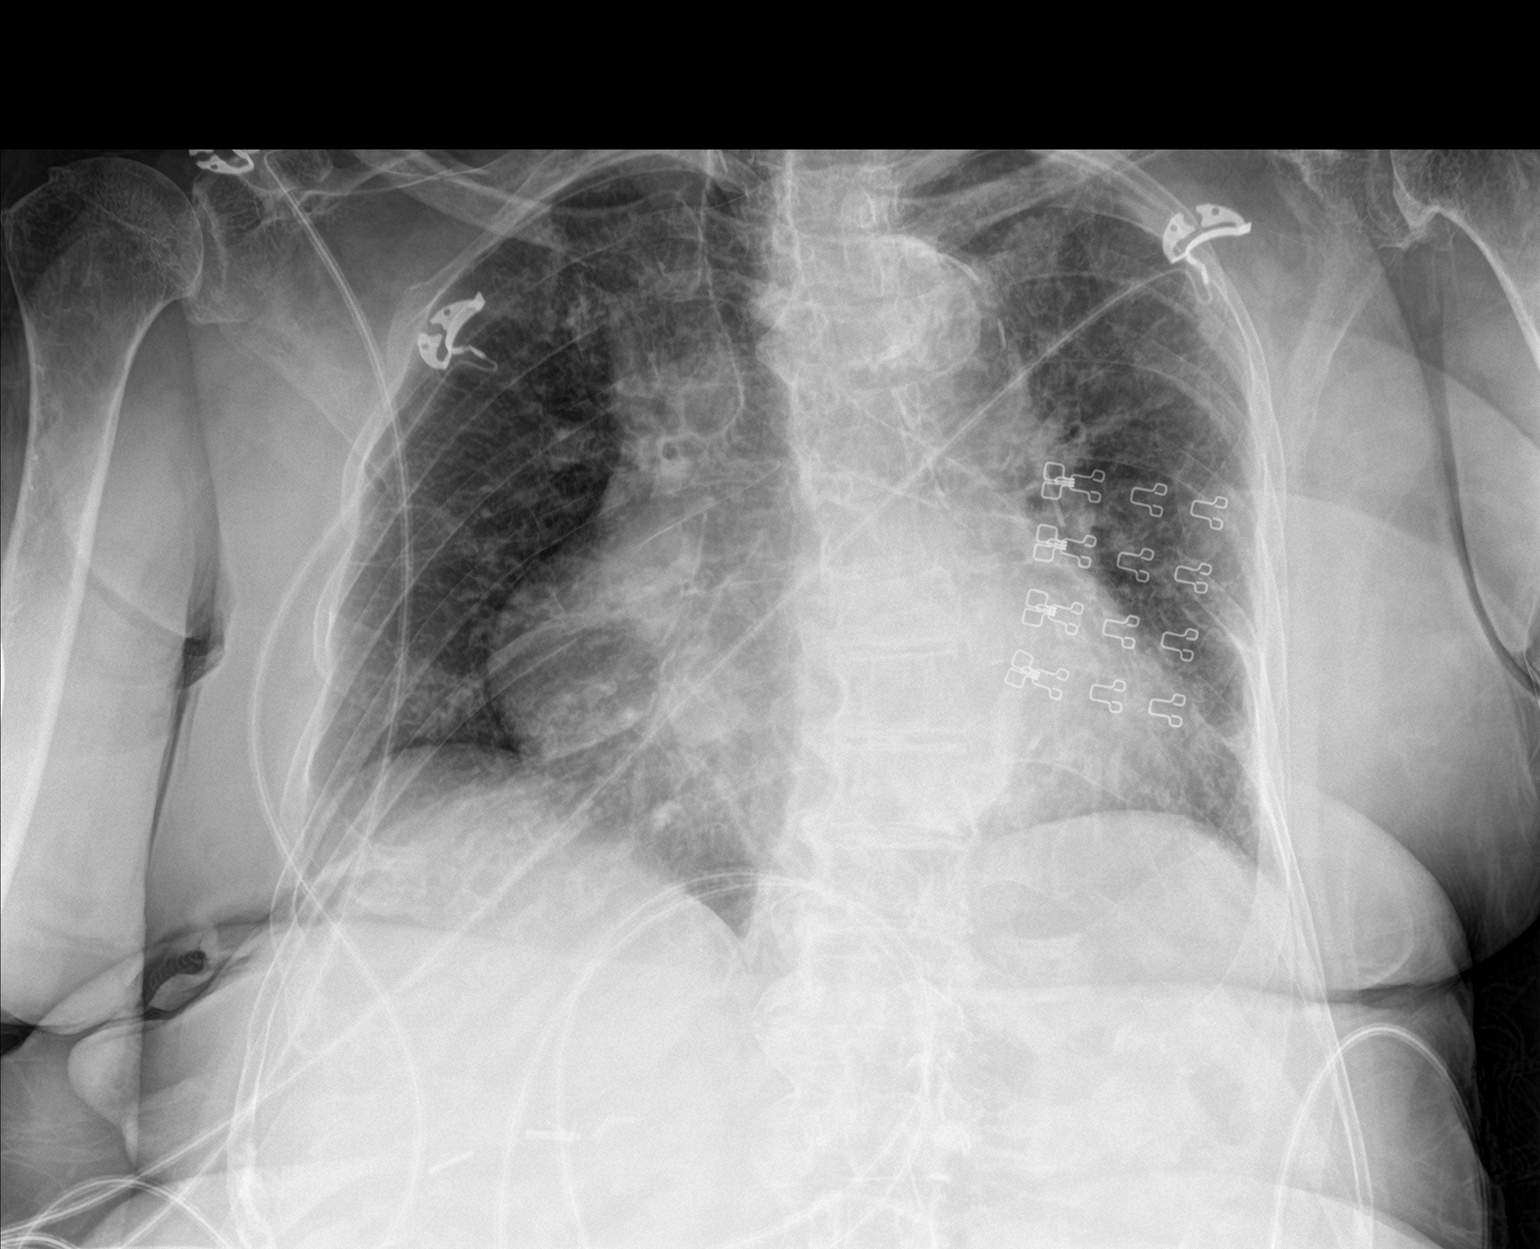

[1 of 1 positions shown; findings below may reference images not displayed]

FINDINGS: Cardiomediastinal silhouette unchanged in size and contour with
cardiomegaly. Right rotation somewhat accentuates the heart borders.
Calcifications of the aortic arch.

Low lung volumes. Coarsened interstitial markings, similar to prior.
No confluent airspace disease, pneumothorax, or pleural effusion.

Degenerative changes of the spine
IMPRESSION: Chronic lung changes and likely atelectasis with no evidence of
acute cardiopulmonary disease.

Cardiomegaly

## 2020-05-31 IMAGING — DX DG FEMUR 2+V*L*
4 series · 4 of 4 positions shown · non-contrast
Comparison: 01/03/2014 CT of the left knee.

CLINICAL DATA: [AGE]/o F; fall this morning with left leg deformity.

EXAM:
LEFT TIBIA AND FIBULA - 2 VIEW; LEFT FEMUR 2 VIEWS; LEFT FOOT -
COMPLETE 3+ VIEW

[femur ap (1 of 2)]
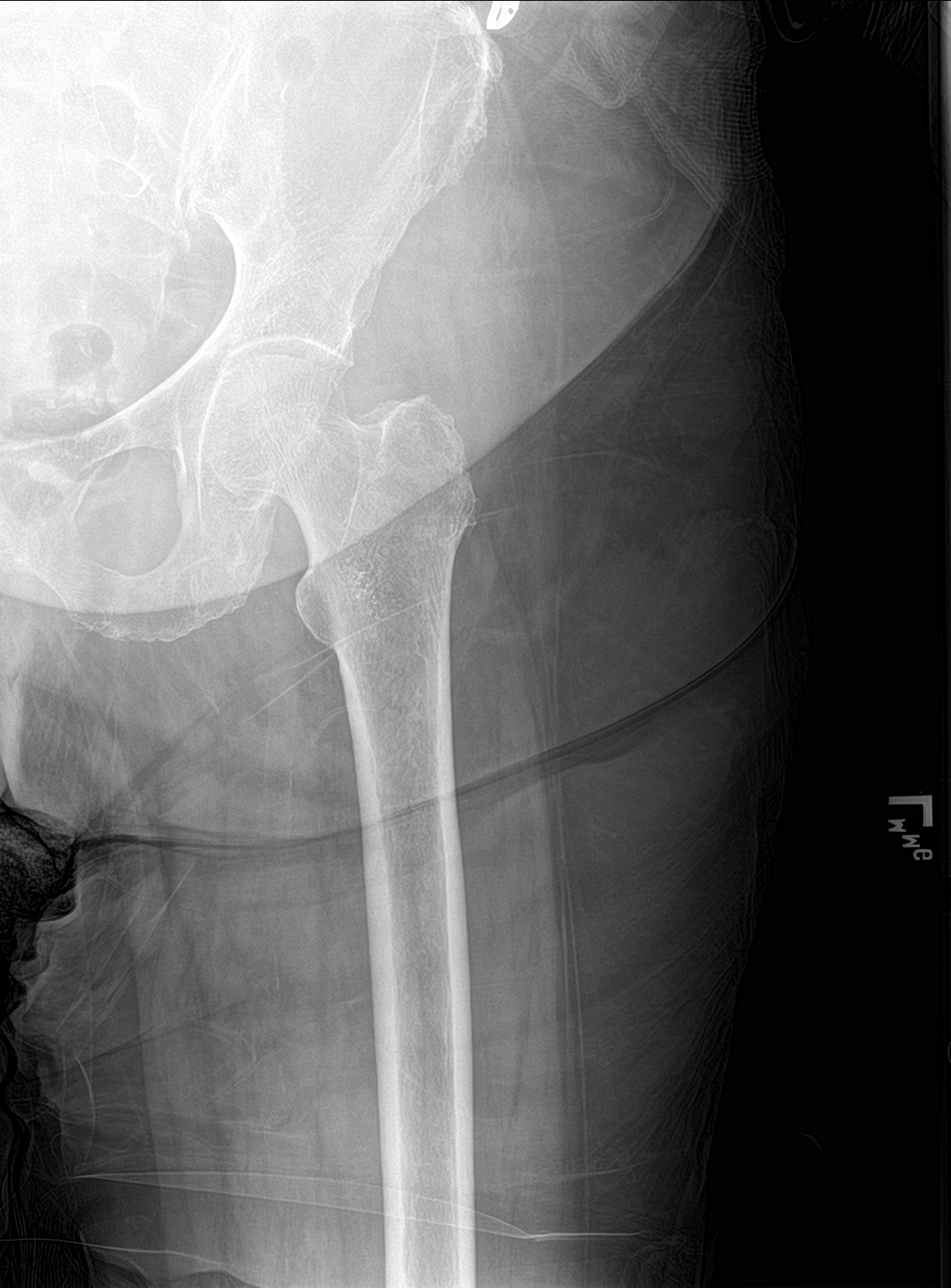

[femur ap (2 of 2)]
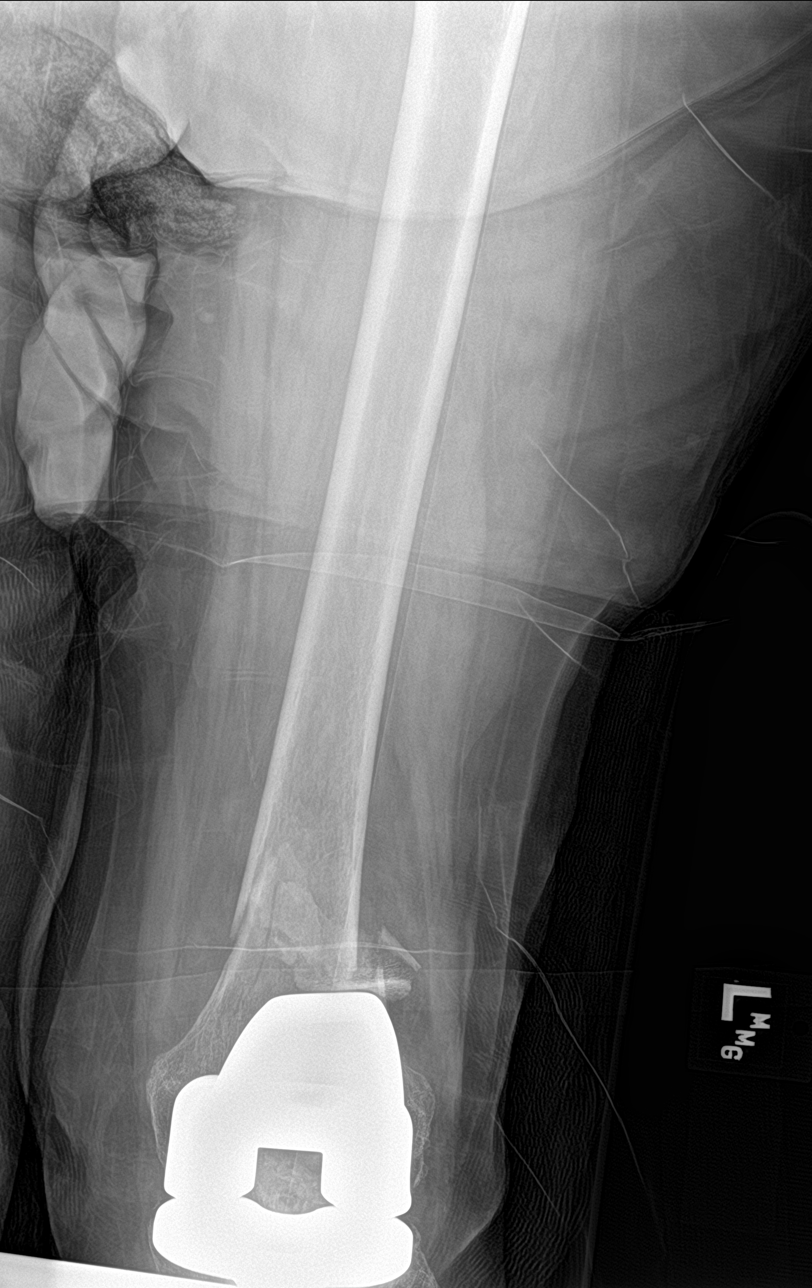

[femur lat (1 of 2)]
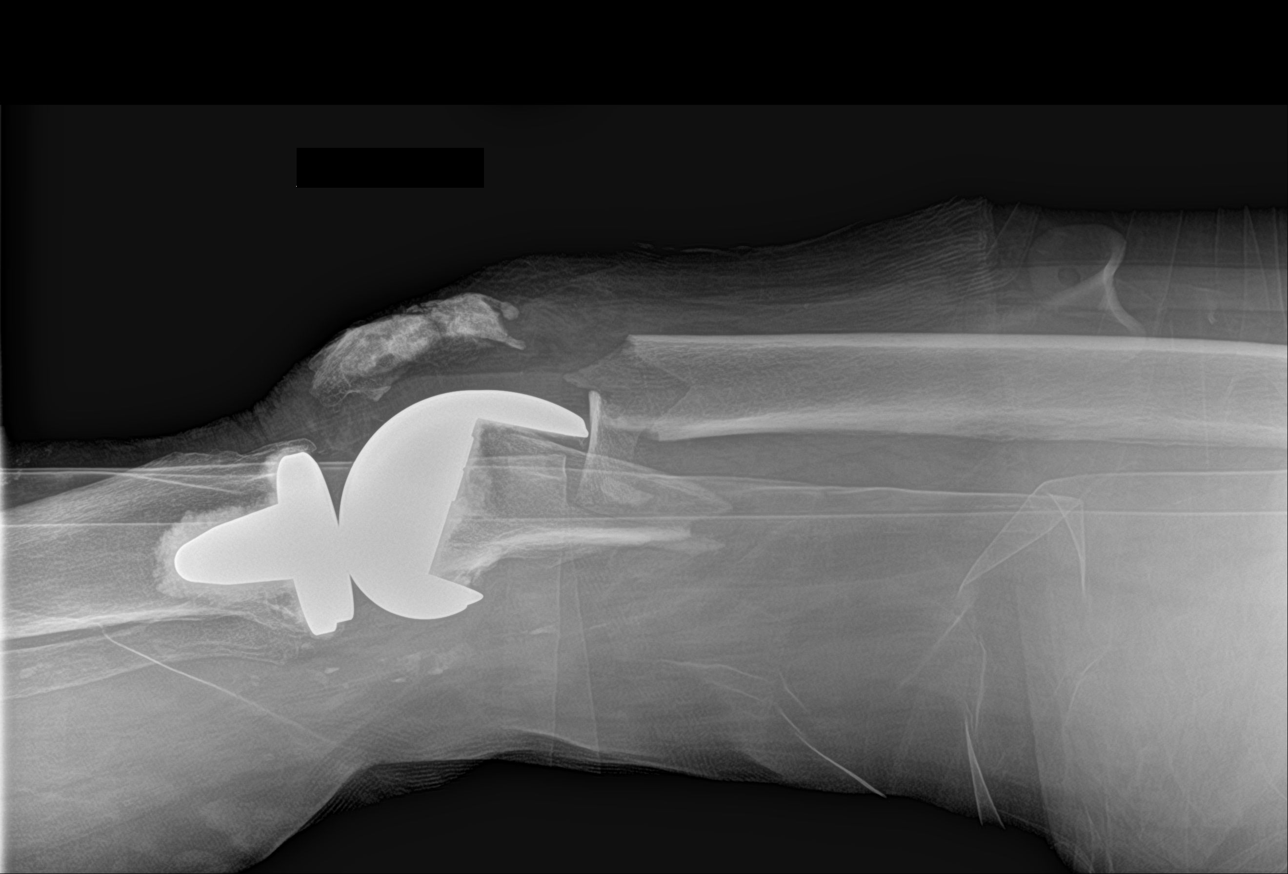

[femur lat (2 of 2)]
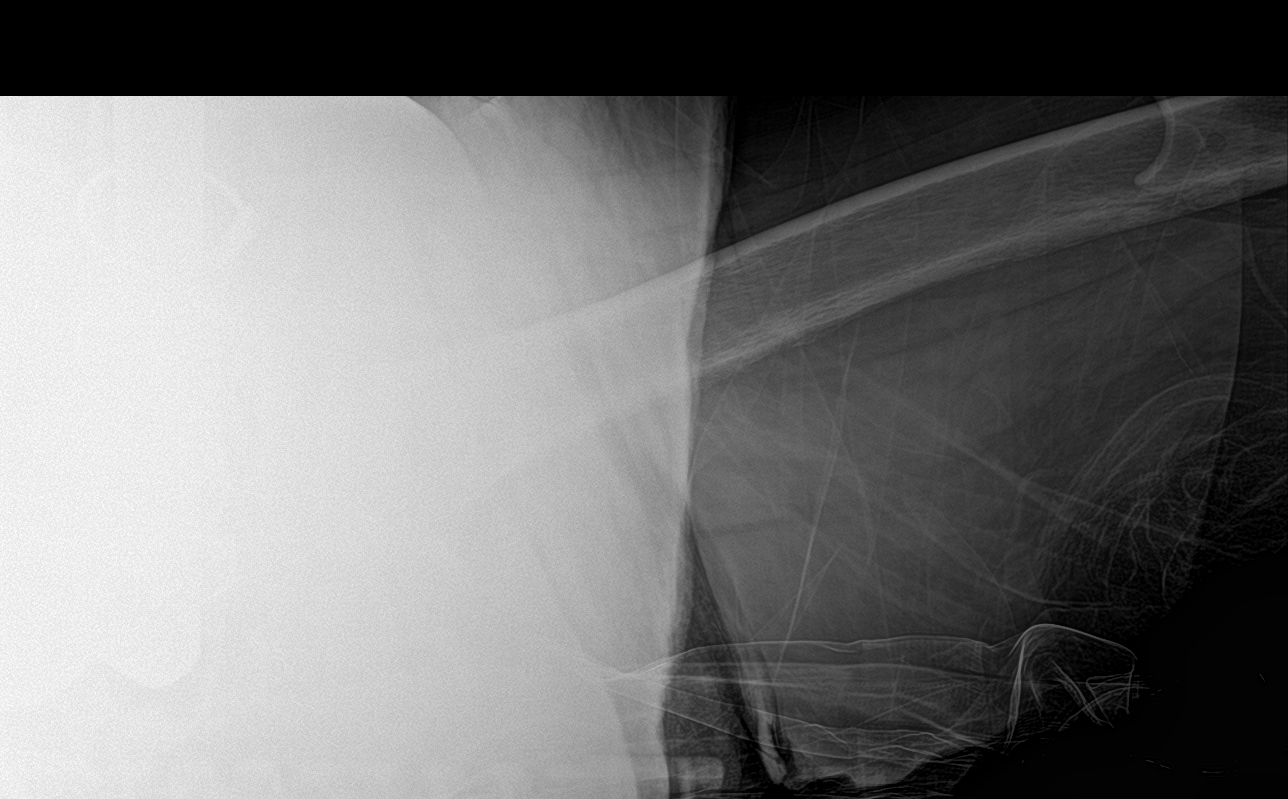

[4 of 4 positions shown; findings below may reference images not displayed]

FINDINGS: Left femur:

Acute comminuted transverse fracture of the lower femoral diaphysis
just above the knee prosthesis with 1 shaft's width posterior
displacement of the distal fracture component. The hip joint appears
well maintained.

Left tibia and fibula:

Left distal femur fracture as above. Total knee prosthesis without
periprosthetic lucency. Chronic fracture deformity of the patella.
Vascular calcifications noted. Acute minimally displaced fracture of
the medial malleolus. Acute oblique minimally displaced fracture of
the lower fibula above level of tibial plafond. No ankle joint
dislocation identified on these nonstress views. Possible minimally
displaced fracture of the posterior malleolus.

Left foot:

There is no evidence of fracture or other focal bone lesions the
foot. Soft tissues are unremarkable.
IMPRESSION: 1. Acute comminuted transverse fracture of the left lower femur
diaphysis just above the knee prosthesis. One shaft's width
posterior displacement of distal fracture component.
2. Acute minimally displaced fracture of left medial malleolus.
3. Acute minimally displaced fracture of the left lower fibula
diaphysis above level of tibial plafond.
4. Possible minimally displaced acute fracture of posterior
malleolus.
5. Chronic fracture deformity of the left patella.

By: Md Bellal Vujel M.D.

## 2020-05-31 IMAGING — DX DG TIBIA/FIBULA 2V*L*
4 series · 4 of 4 positions shown · non-contrast
Comparison: 01/03/2014 CT of the left knee.

CLINICAL DATA: [AGE]/o F; fall this morning with left leg deformity.

EXAM:
LEFT TIBIA AND FIBULA - 2 VIEW; LEFT FEMUR 2 VIEWS; LEFT FOOT -
COMPLETE 3+ VIEW

[tibia ap (1 of 2)]
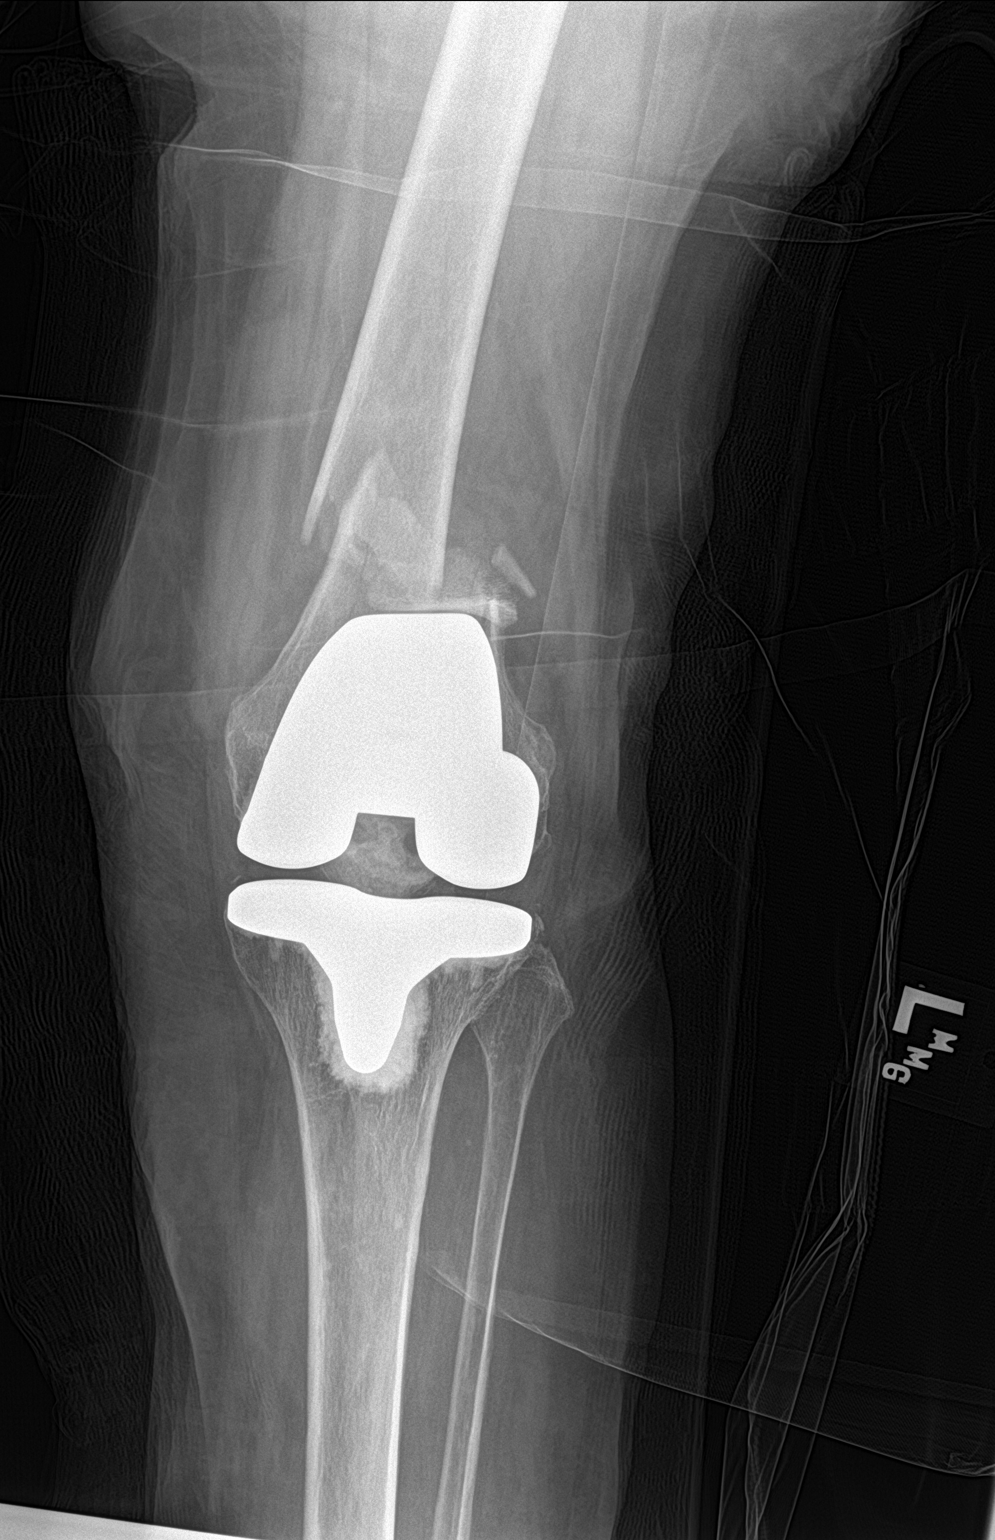

[tibia ap (2 of 2)]
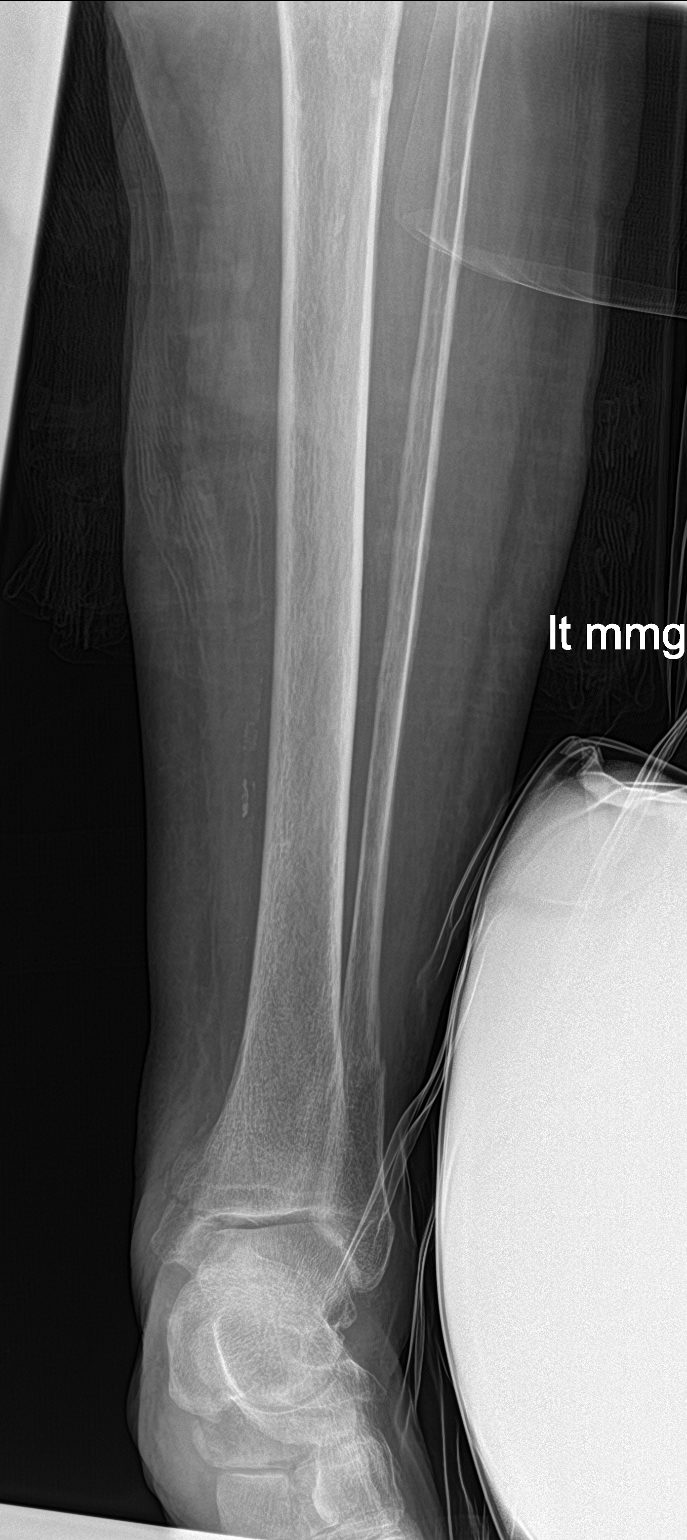

[tibia lat (1 of 2)]
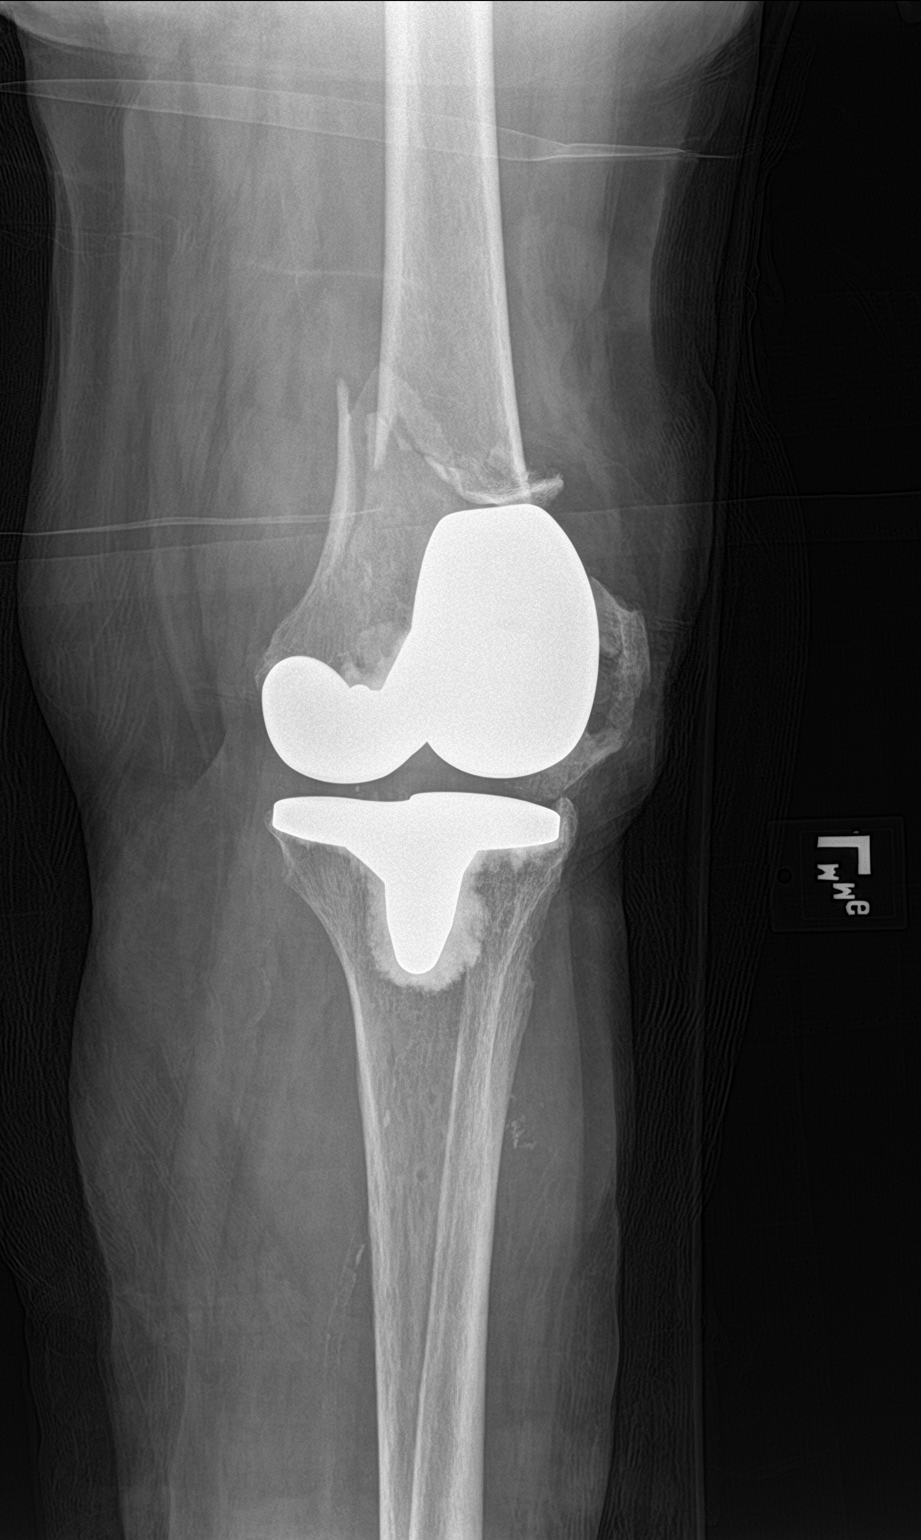

[tibia lat (2 of 2)]
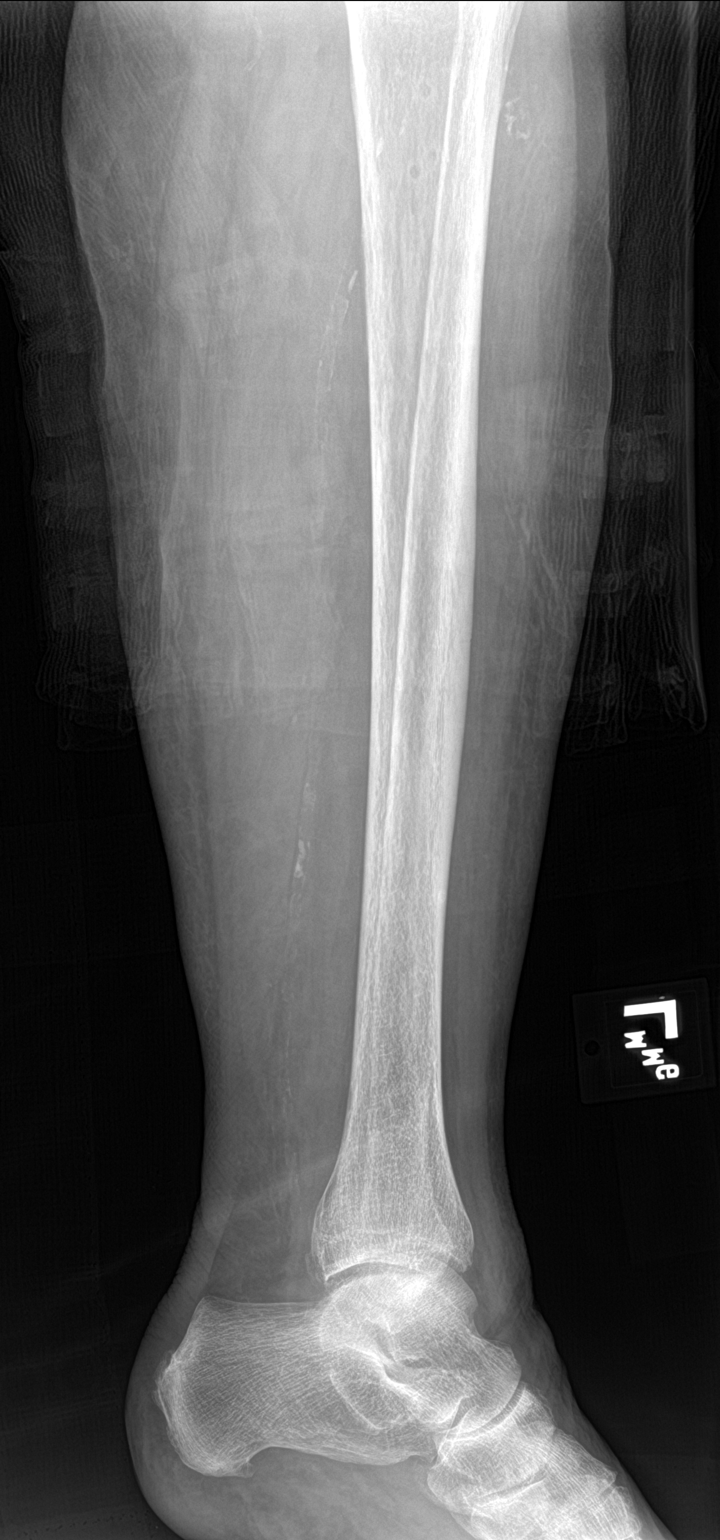

[4 of 4 positions shown; findings below may reference images not displayed]

FINDINGS: Left femur:

Acute comminuted transverse fracture of the lower femoral diaphysis
just above the knee prosthesis with 1 shaft's width posterior
displacement of the distal fracture component. The hip joint appears
well maintained.

Left tibia and fibula:

Left distal femur fracture as above. Total knee prosthesis without
periprosthetic lucency. Chronic fracture deformity of the patella.
Vascular calcifications noted. Acute minimally displaced fracture of
the medial malleolus. Acute oblique minimally displaced fracture of
the lower fibula above level of tibial plafond. No ankle joint
dislocation identified on these nonstress views. Possible minimally
displaced fracture of the posterior malleolus.

Left foot:

There is no evidence of fracture or other focal bone lesions the
foot. Soft tissues are unremarkable.
IMPRESSION: 1. Acute comminuted transverse fracture of the left lower femur
diaphysis just above the knee prosthesis. One shaft's width
posterior displacement of distal fracture component.
2. Acute minimally displaced fracture of left medial malleolus.
3. Acute minimally displaced fracture of the left lower fibula
diaphysis above level of tibial plafond.
4. Possible minimally displaced acute fracture of posterior
malleolus.
5. Chronic fracture deformity of the left patella.

By: Md Bellal Vujel M.D.

## 2020-05-31 IMAGING — CT CT CERVICAL SPINE W/O CM
4 of 7 series · 14 of 33 positions shown, 15 images · non-contrast
Comparison: 03/11/2018

CLINICAL DATA: Recent fall with head injury

EXAM:
CT HEAD WITHOUT CONTRAST
CT CERVICAL SPINE WITHOUT CONTRAST
TECHNIQUE: Multidetector CT imaging of the head and cervical spine was
performed following the standard protocol without intravenous
contrast. Multiplanar CT image reconstructions of the cervical spine
were also generated.

[Series 9: c_spine 2.0 st · axial · 0.28mm/px · z∈[-248,-128]mm · 4 of 100 slices shown, 5 images]
[im 20/100  soft-tissue]
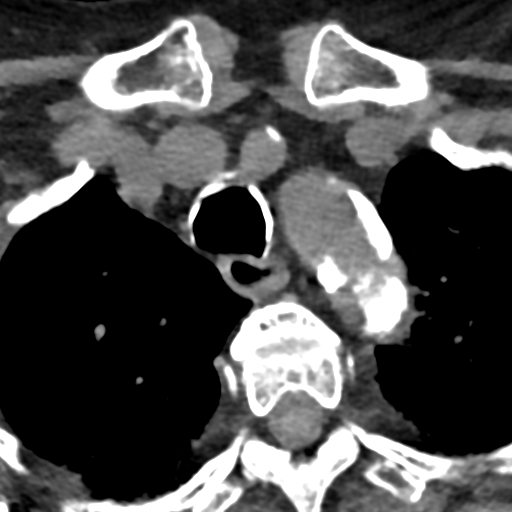
[im 20/100  bone]
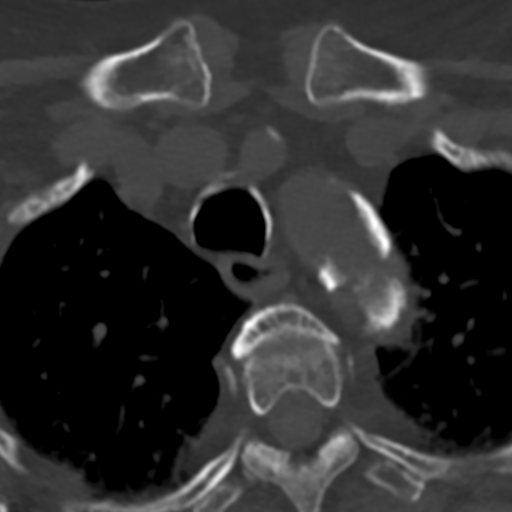
[im 40/100  bone]
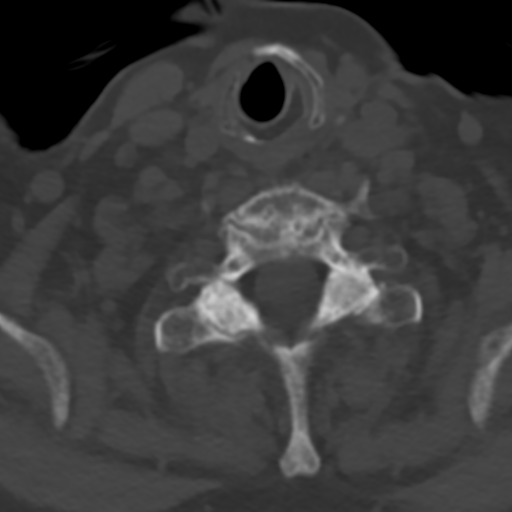
[im 60/100  bone]
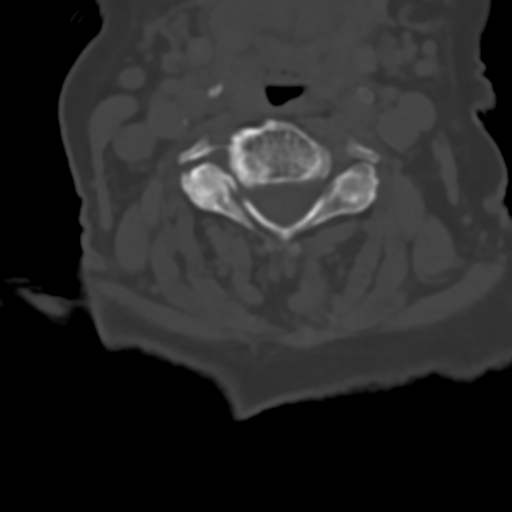
[im 80/100  bone]
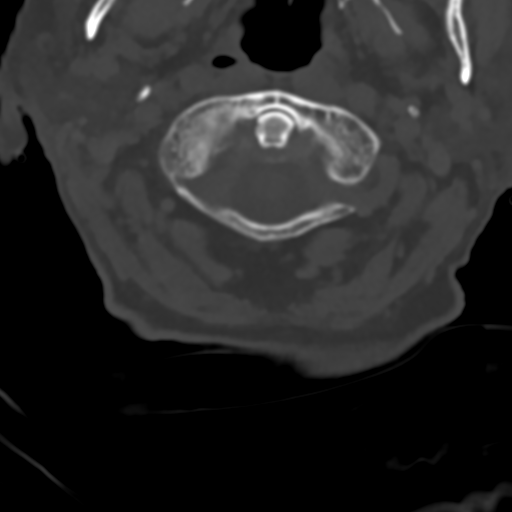

[Series 10: coronal bone · coronal · 0.27mm/px · 1 of 88 slices shown]
[im 44/88  bone]
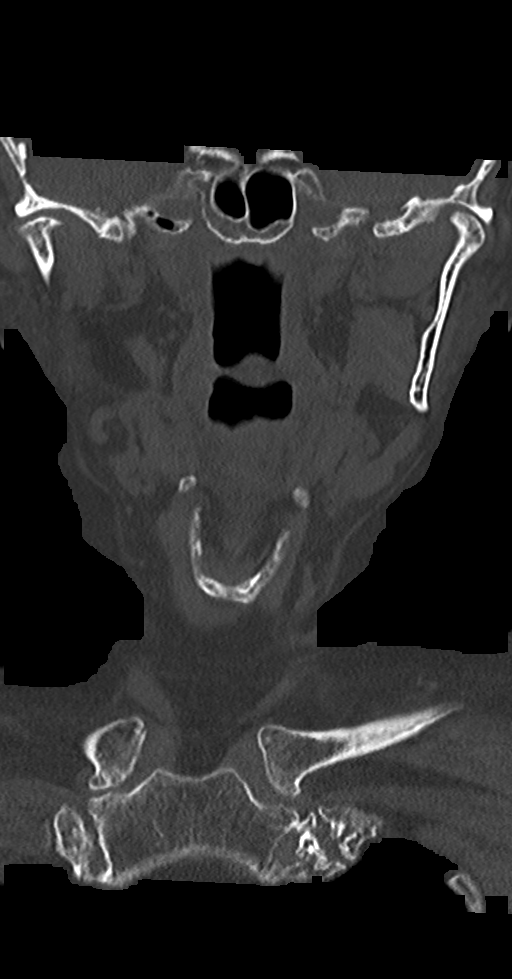

[Series 11: sagittal bone · sagittal · 0.47mm/px · 5 of 61 slices shown]
[im 11/61  bone]
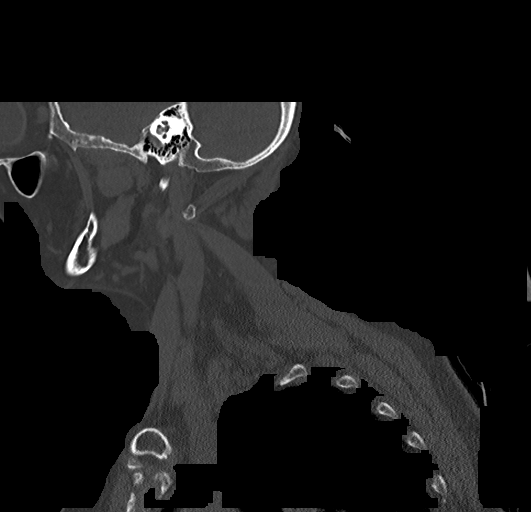
[im 21/61  bone]
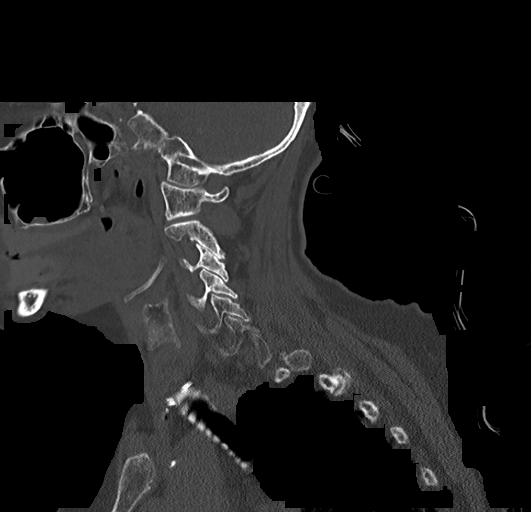
[im 31/61  bone]
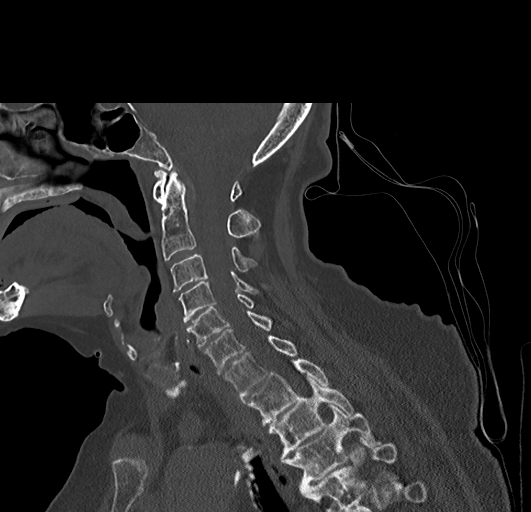
[im 41/61  bone]
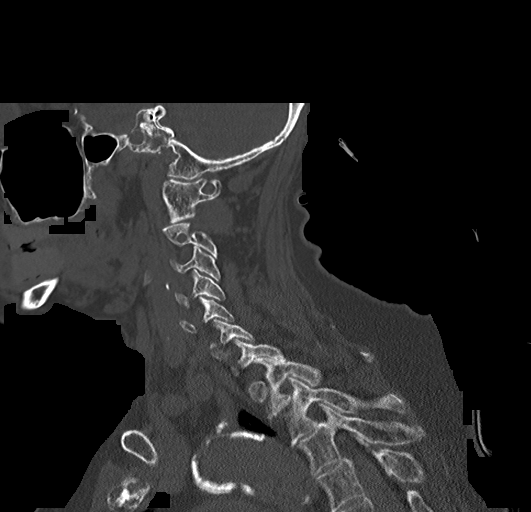
[im 51/61  bone]
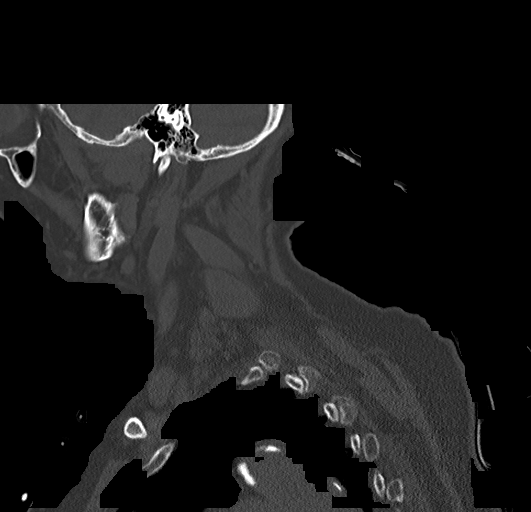

[Series 13: orthogonal axial st · axial · 0.21mm/px · z∈[-273,-156]mm · 4 of 106 slices shown]
[im 22/106  bone]
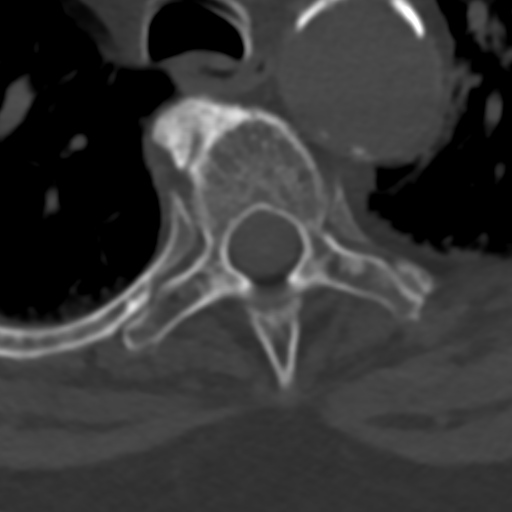
[im 43/106  bone]
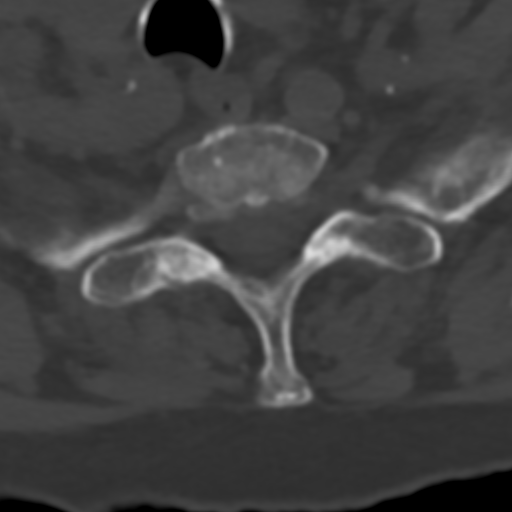
[im 64/106  bone]
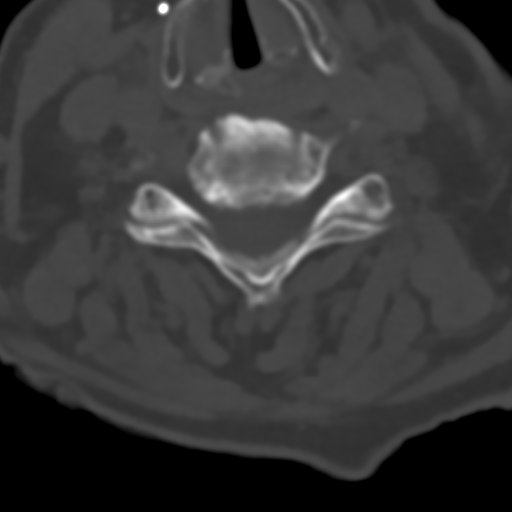
[im 85/106  bone]
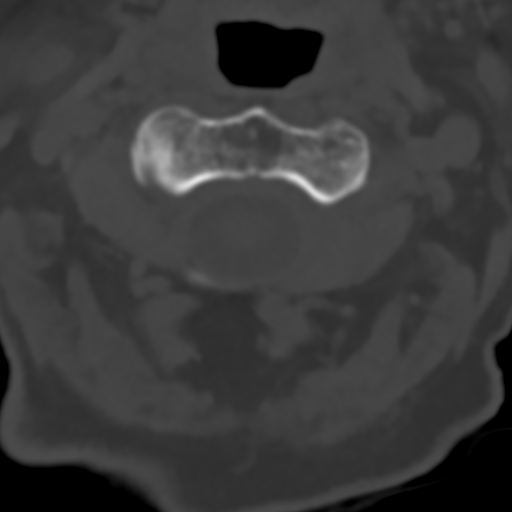

[14 of 33 positions shown; findings below may reference images not displayed]

FINDINGS: CT HEAD FINDINGS

Brain: Chronic atrophic changes and chronic white matter ischemic
changes again noted and stable. No findings to suggest acute
hemorrhage, acute infarction space-occupying lesion seen.

Vascular: No hyperdense vessel or unexpected calcification.

Skull: Normal. Negative for fracture or focal lesion.

Sinuses/Orbits: No acute finding.

Other: None.

CT CERVICAL SPINE FINDINGS

Alignment: Within normal limits.

Skull base and vertebrae: 7 cervical segments are well visualized.
Vertebral body height is well maintained. Multilevel disc space
narrowing is noted from C3-C7. Mild osteophytic changes and facet
hypertrophic changes are noted. No findings to suggest acute
fracture or facet abnormality seen. The odontoid is within normal
limits. Multilevel neural foraminal narrowing is noted bilaterally.

Soft tissues and spinal canal: Diffuse vascular calcifications are
noted. No acute soft tissue abnormality is noted.

Upper chest: Within normal limits

Other: None
IMPRESSION: CT of the head: Chronic atrophic and ischemic changes without acute
abnormality.

CT of the cervical spine: Multilevel degenerative change without
acute abnormality.

## 2020-05-31 IMAGING — DX DG FOOT COMPLETE 3+V*L*
3 series · 3 of 3 positions shown · non-contrast
Comparison: 01/03/2014 CT of the left knee.

CLINICAL DATA: [AGE]/o F; fall this morning with left leg deformity.

EXAM:
LEFT TIBIA AND FIBULA - 2 VIEW; LEFT FEMUR 2 VIEWS; LEFT FOOT -
COMPLETE 3+ VIEW

[foot ap]
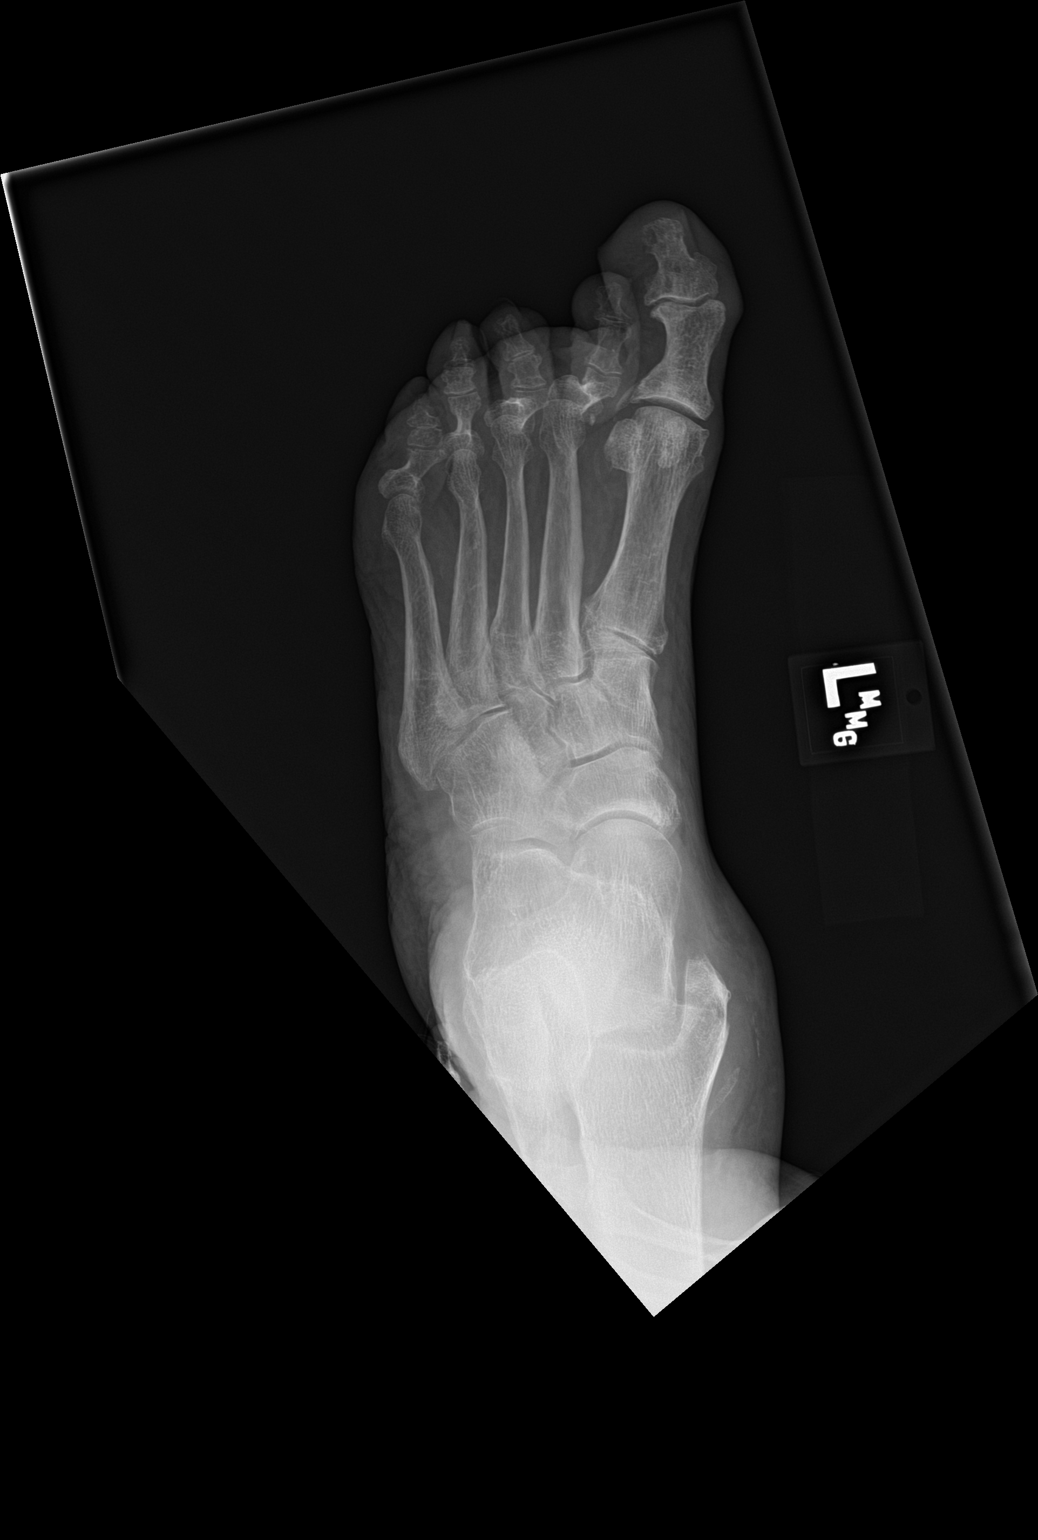

[foot obl]
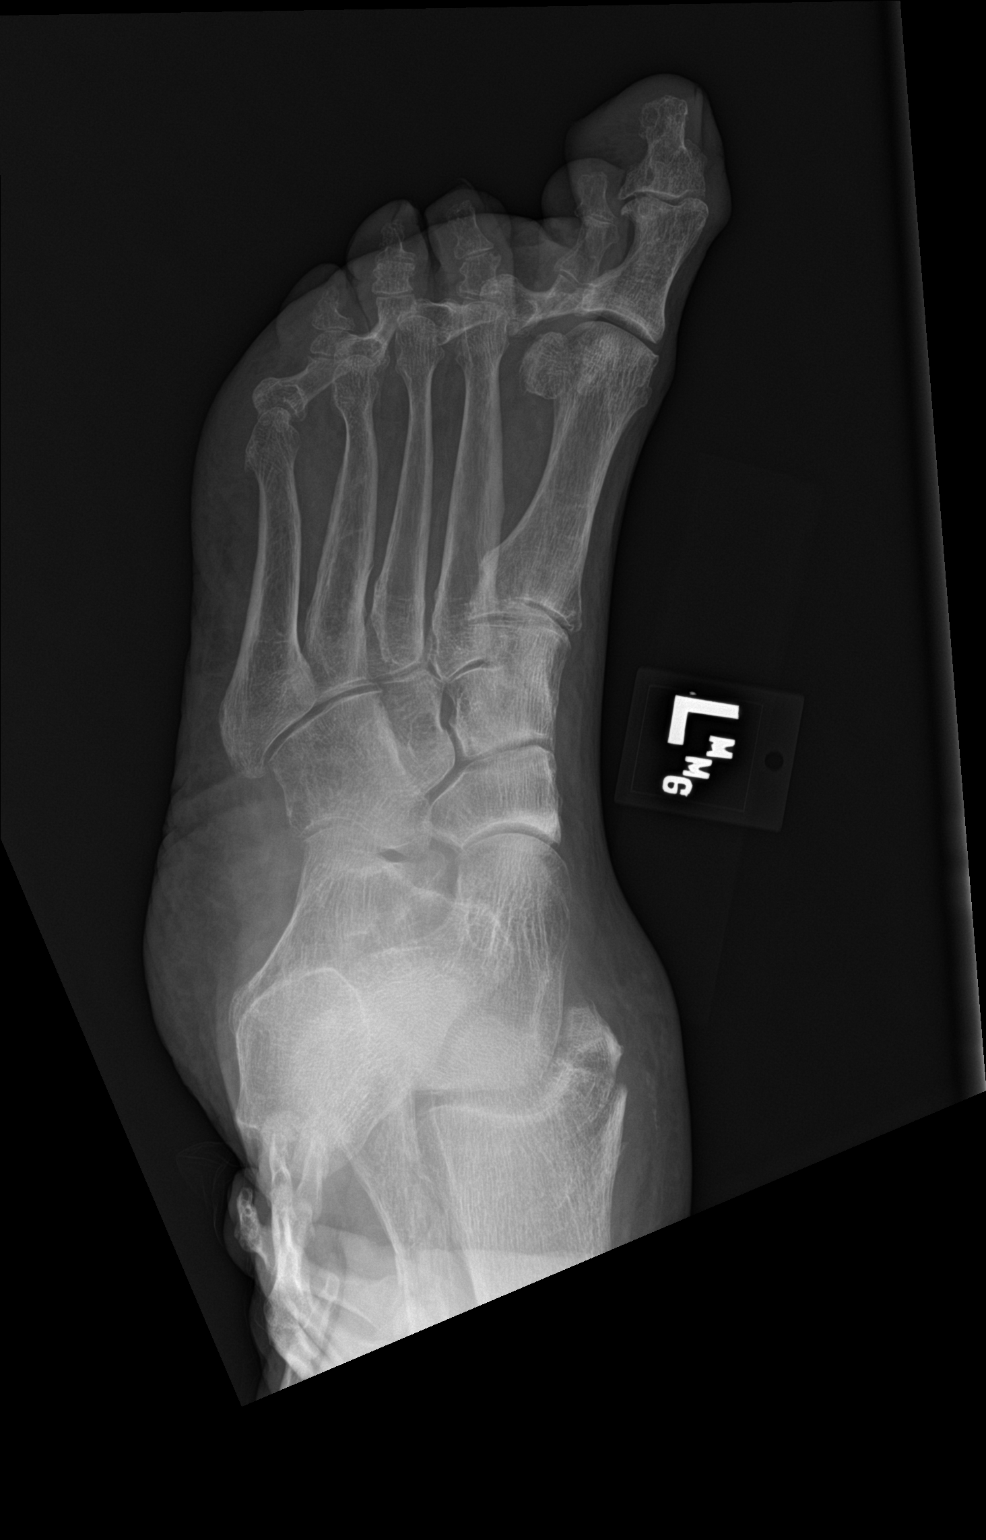

[foot lat]
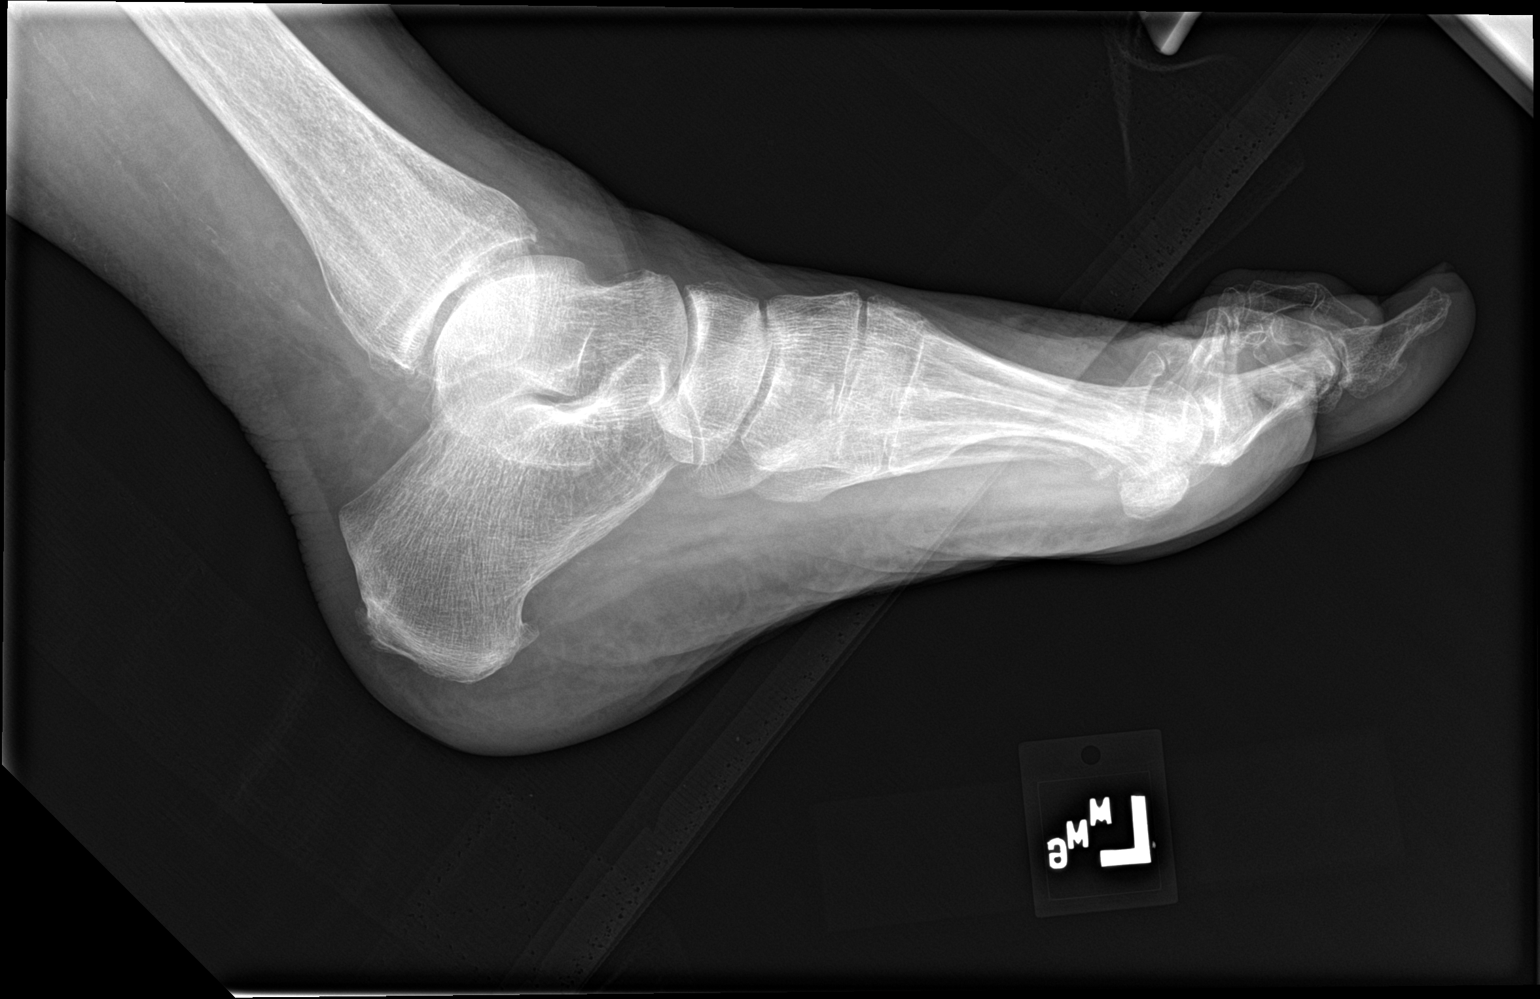

[3 of 3 positions shown; findings below may reference images not displayed]

FINDINGS: Left femur:

Acute comminuted transverse fracture of the lower femoral diaphysis
just above the knee prosthesis with 1 shaft's width posterior
displacement of the distal fracture component. The hip joint appears
well maintained.

Left tibia and fibula:

Left distal femur fracture as above. Total knee prosthesis without
periprosthetic lucency. Chronic fracture deformity of the patella.
Vascular calcifications noted. Acute minimally displaced fracture of
the medial malleolus. Acute oblique minimally displaced fracture of
the lower fibula above level of tibial plafond. No ankle joint
dislocation identified on these nonstress views. Possible minimally
displaced fracture of the posterior malleolus.

Left foot:

There is no evidence of fracture or other focal bone lesions the
foot. Soft tissues are unremarkable.
IMPRESSION: 1. Acute comminuted transverse fracture of the left lower femur
diaphysis just above the knee prosthesis. One shaft's width
posterior displacement of distal fracture component.
2. Acute minimally displaced fracture of left medial malleolus.
3. Acute minimally displaced fracture of the left lower fibula
diaphysis above level of tibial plafond.
4. Possible minimally displaced acute fracture of posterior
malleolus.
5. Chronic fracture deformity of the left patella.

By: Md Bellal Vujel M.D.

## 2020-05-31 IMAGING — DX DG ANKLE 2V *L*
2 series · 2 of 2 positions shown · non-contrast
Comparison: Films from earlier in the same day.

CLINICAL DATA: Recent fall with left ankle pain and deformity,
initial encounter

EXAM:
LEFT ANKLE - 2 VIEW

[ankle ap]
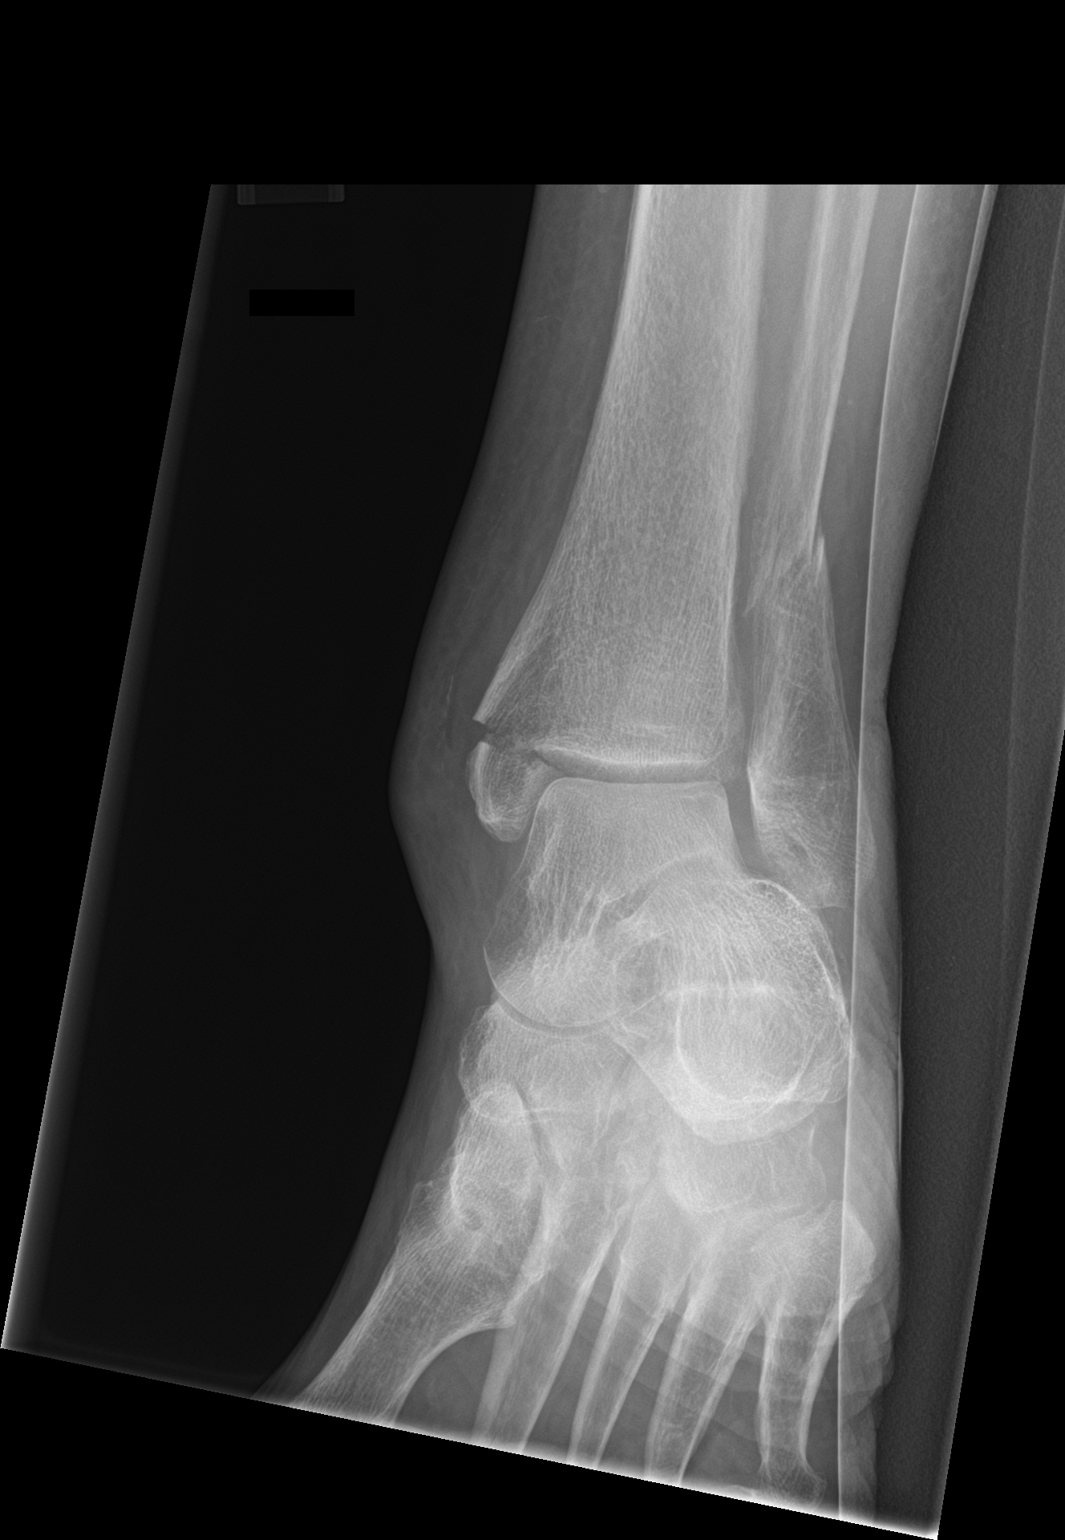

[ankle lat]
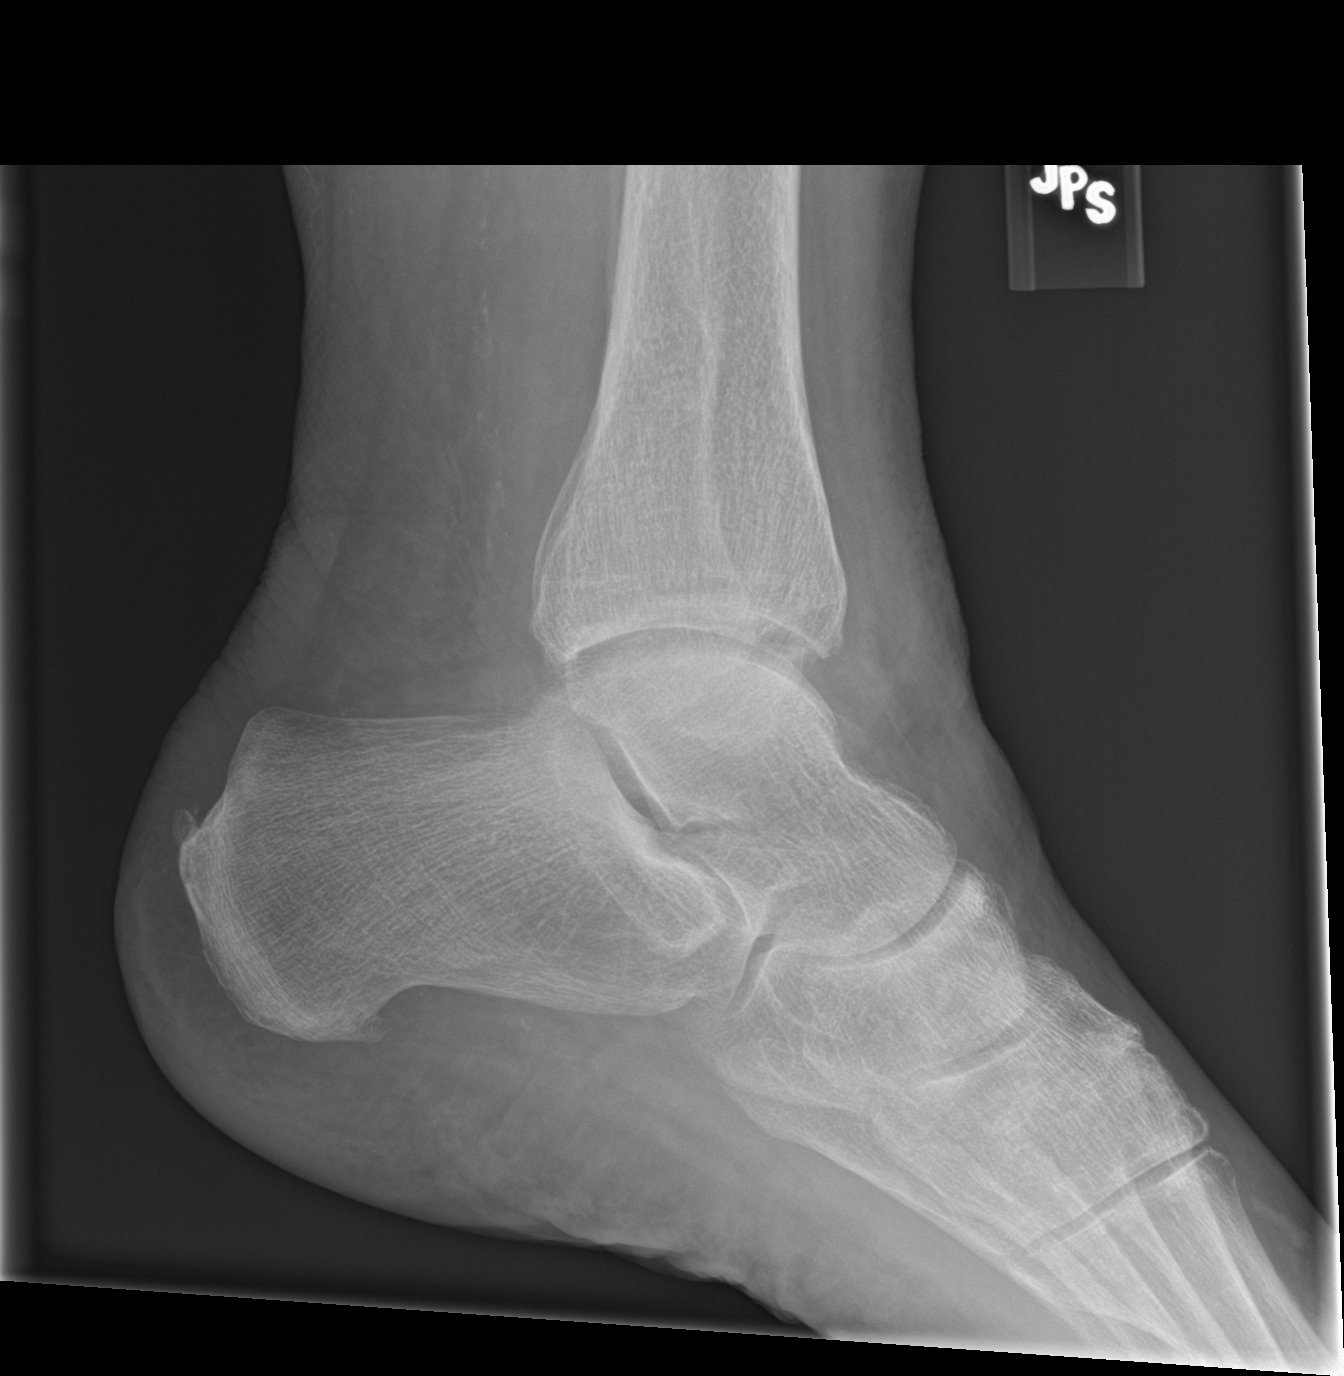

[2 of 2 positions shown; findings below may reference images not displayed]

FINDINGS: Oblique fracture through the distal fibular metaphysis is noted.
Additionally a transverse fracture through the medial malleolus is
seen. Slight angulation laterally at the fracture site is noted.
Soft tissue swelling is seen. No other fracture is identified.
IMPRESSION: Distal tibial and fibular fractures as described. No definitive
posterior malleolar fracture is seen.

## 2020-06-01 IMAGING — RF DG C-ARM 61-120 MIN
1 series · 6 of 6 positions shown · non-contrast
Comparison: None.

CLINICAL DATA: ORIF DISTAL LEFT FEMUR

EXAM:
DG C-ARM 61-120 MIN

[Series 1: run · 6 of 6 slices shown]
[im 1/6]
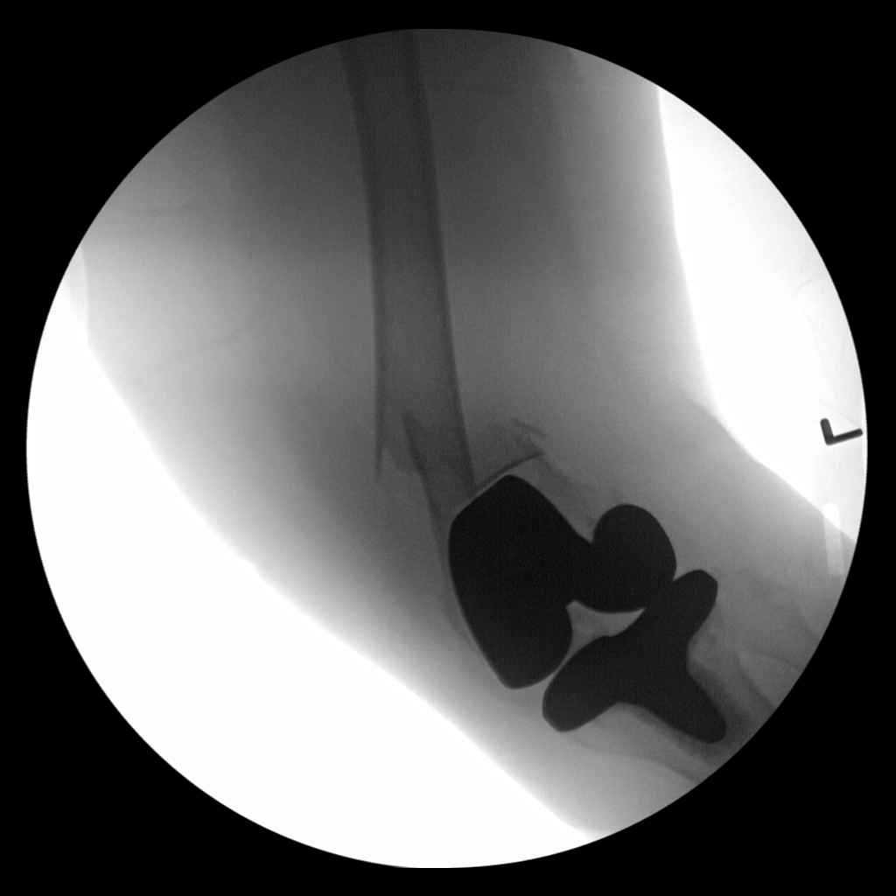
[im 2/6]
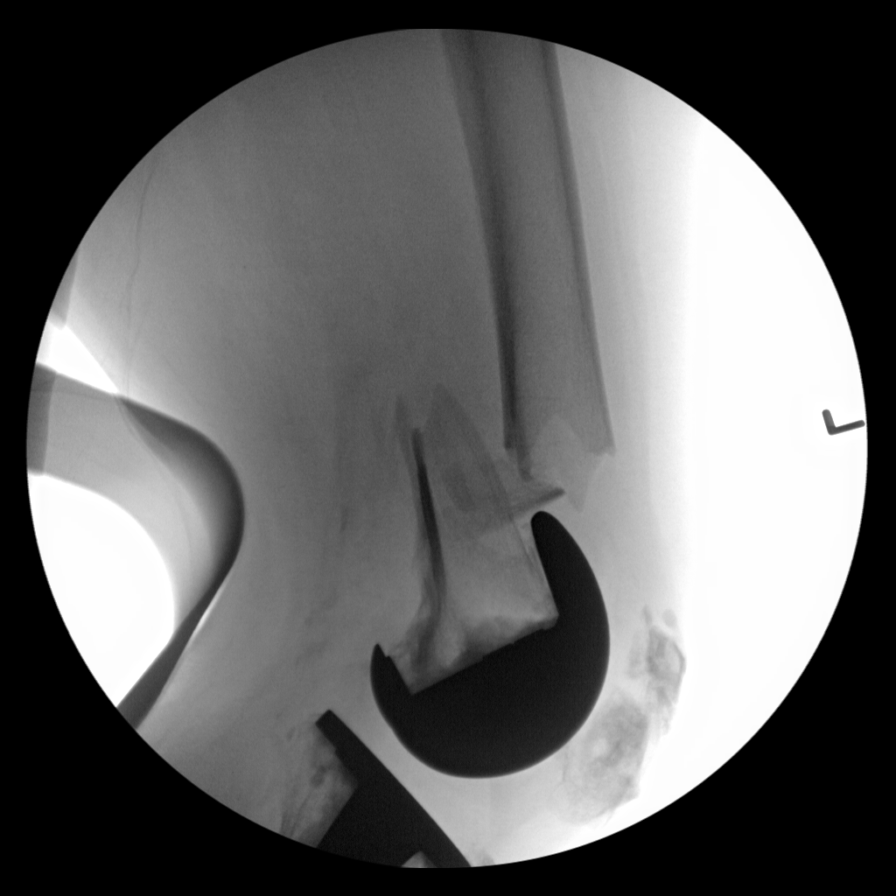
[im 3/6]
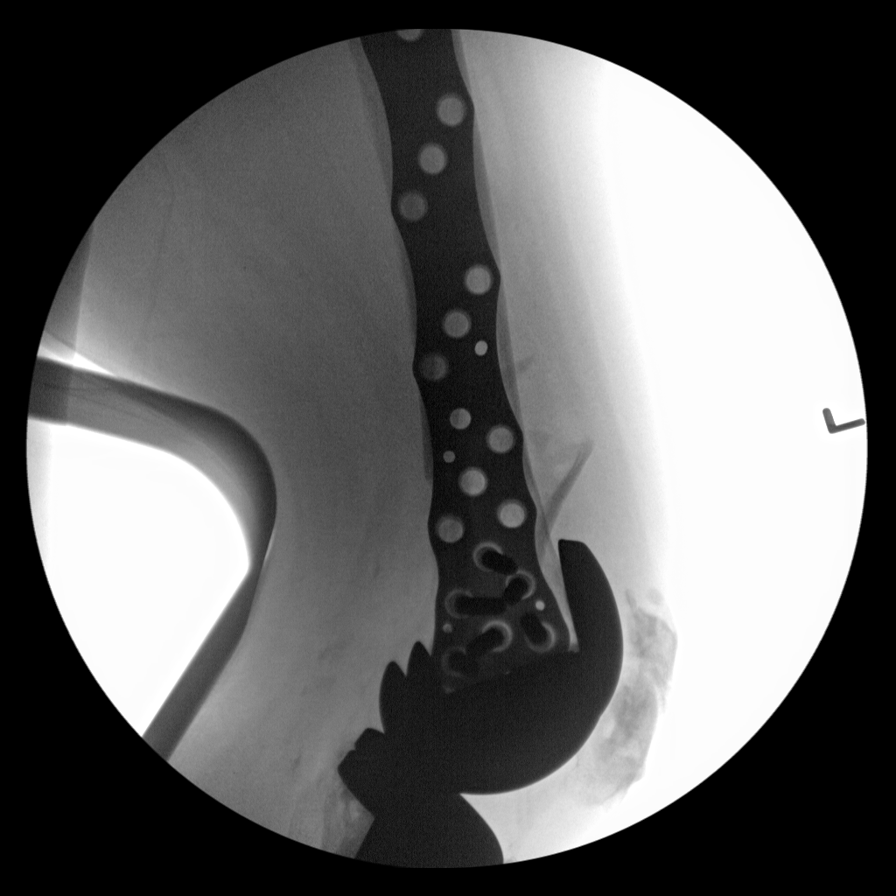
[im 4/6]
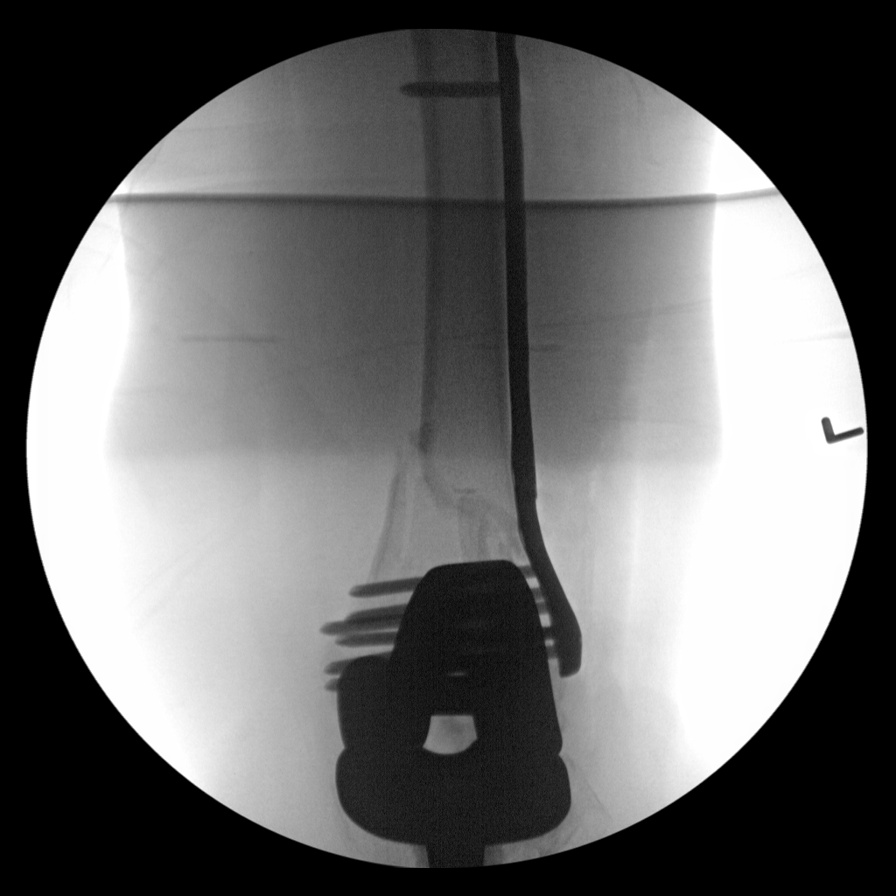
[im 5/6]
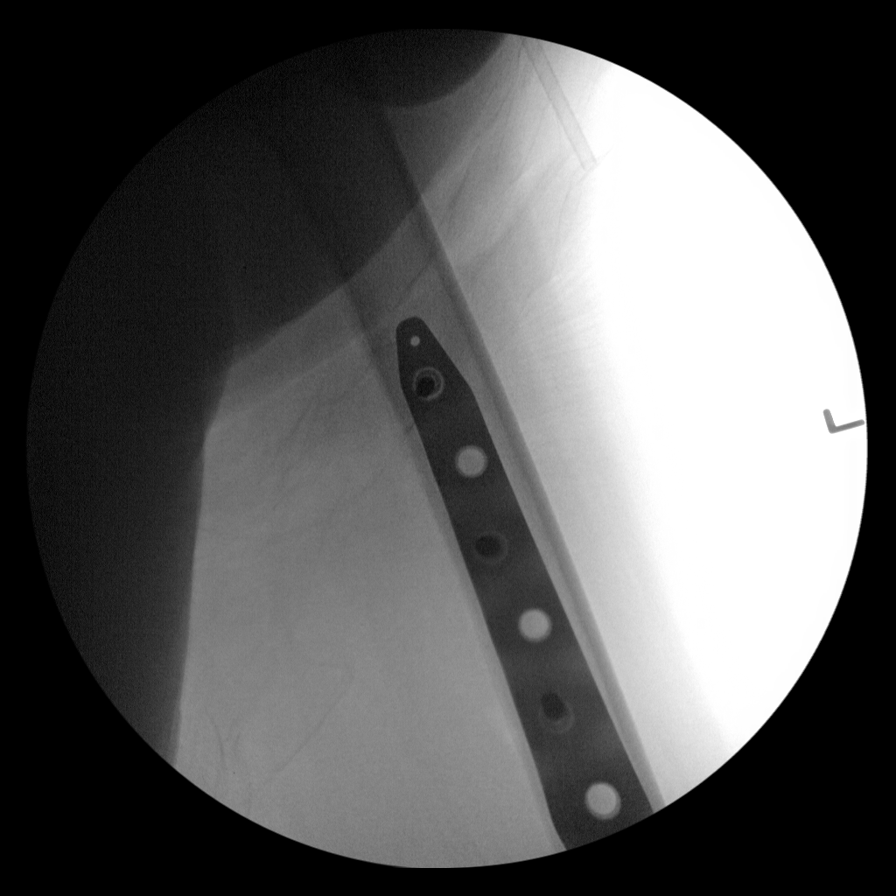
[im 6/6]
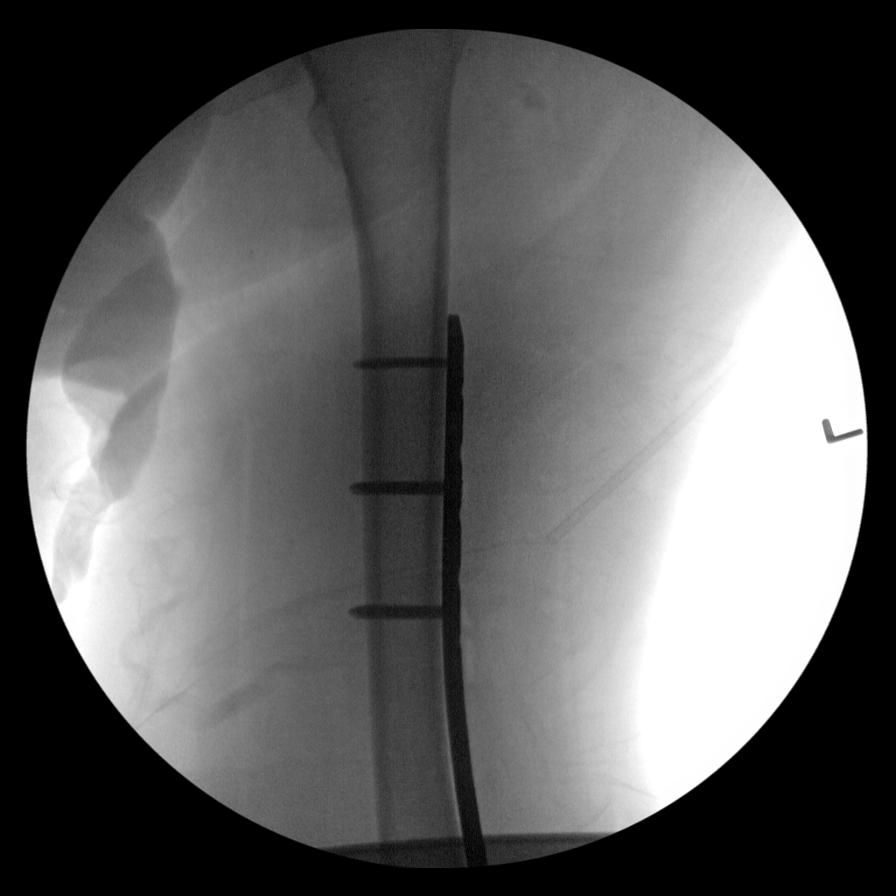

[6 of 6 positions shown; findings below may reference images not displayed]

FINDINGS: Intraoperative fluoroscopic images demonstrating plate and screw
fixation hardware of the LEFT femur fracture. Final images
demonstrated significantly improved osseous alignment. Fluoroscopy
was provided for 87 seconds.
IMPRESSION: Intraoperative fluoroscopic images demonstrating plate and screw
fixation hardware of the LEFT femur. Fluoroscopy provided for the
procedure.

## 2020-06-01 IMAGING — DX DG FEMUR 2+V PORT*L*
1 series · 4 of 4 positions shown · non-contrast
Comparison: Plain films dated 04/13/2018.

CLINICAL DATA: S/P ORIF left femur

EXAM:
LEFT FEMUR PORTABLE 2 VIEWS

[Series 1: femur · 0.14mm/px · 4 of 4 slices shown]
[im 1/4]
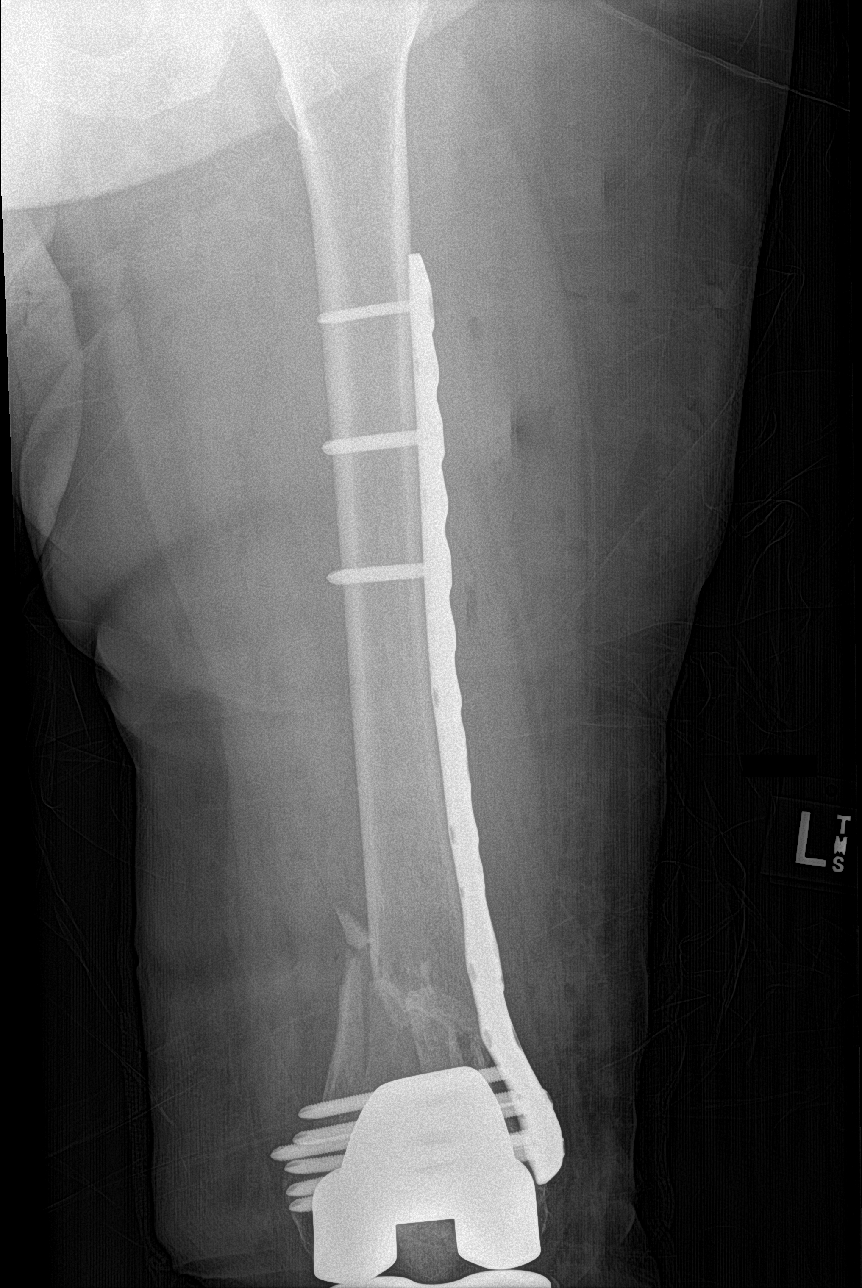
[im 2/4]
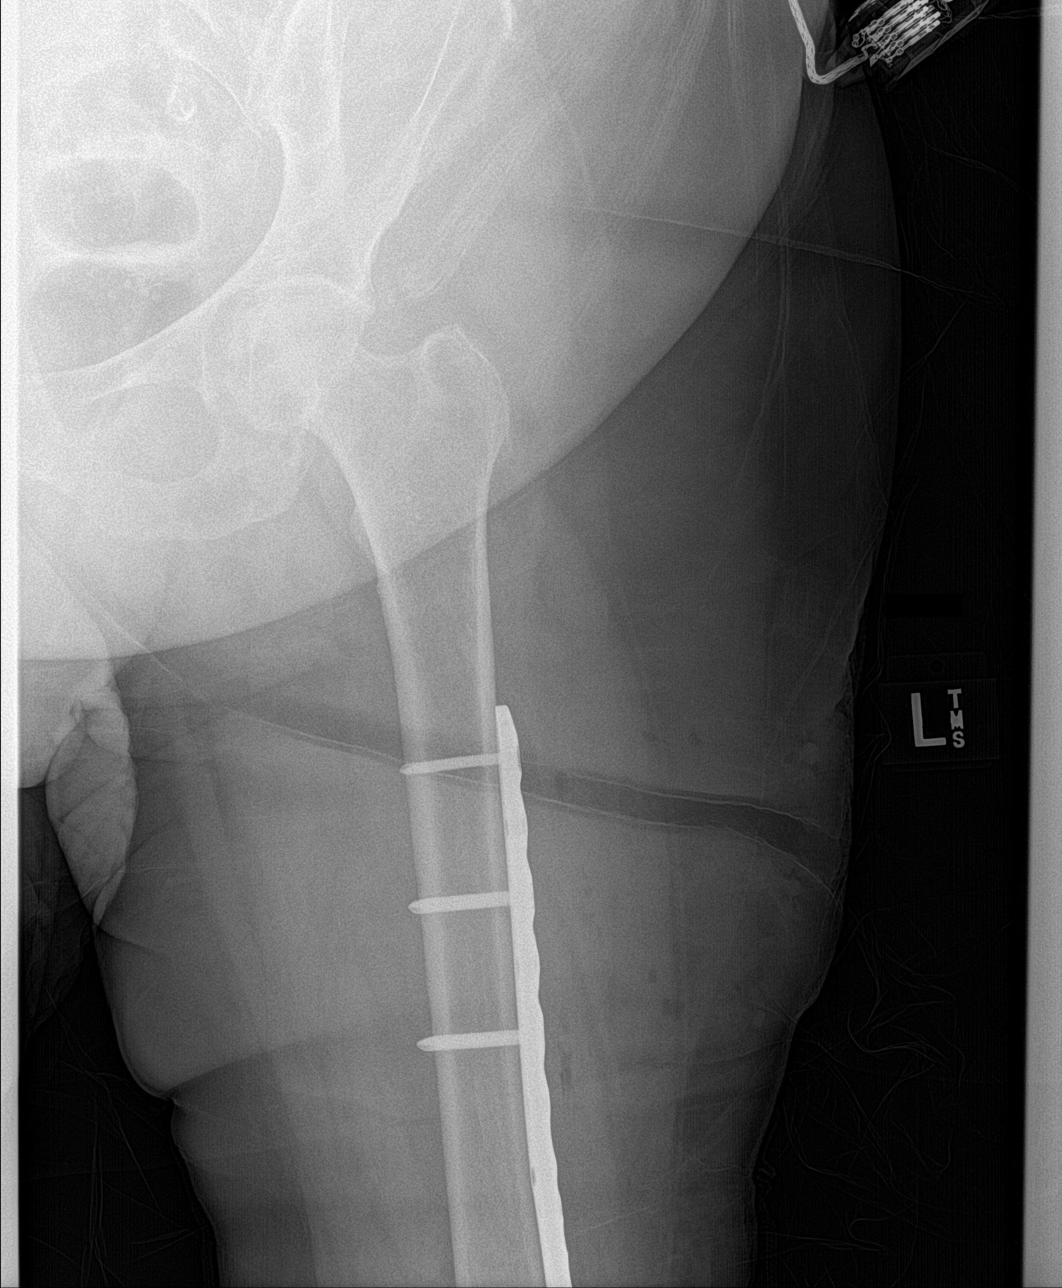
[im 3/4]
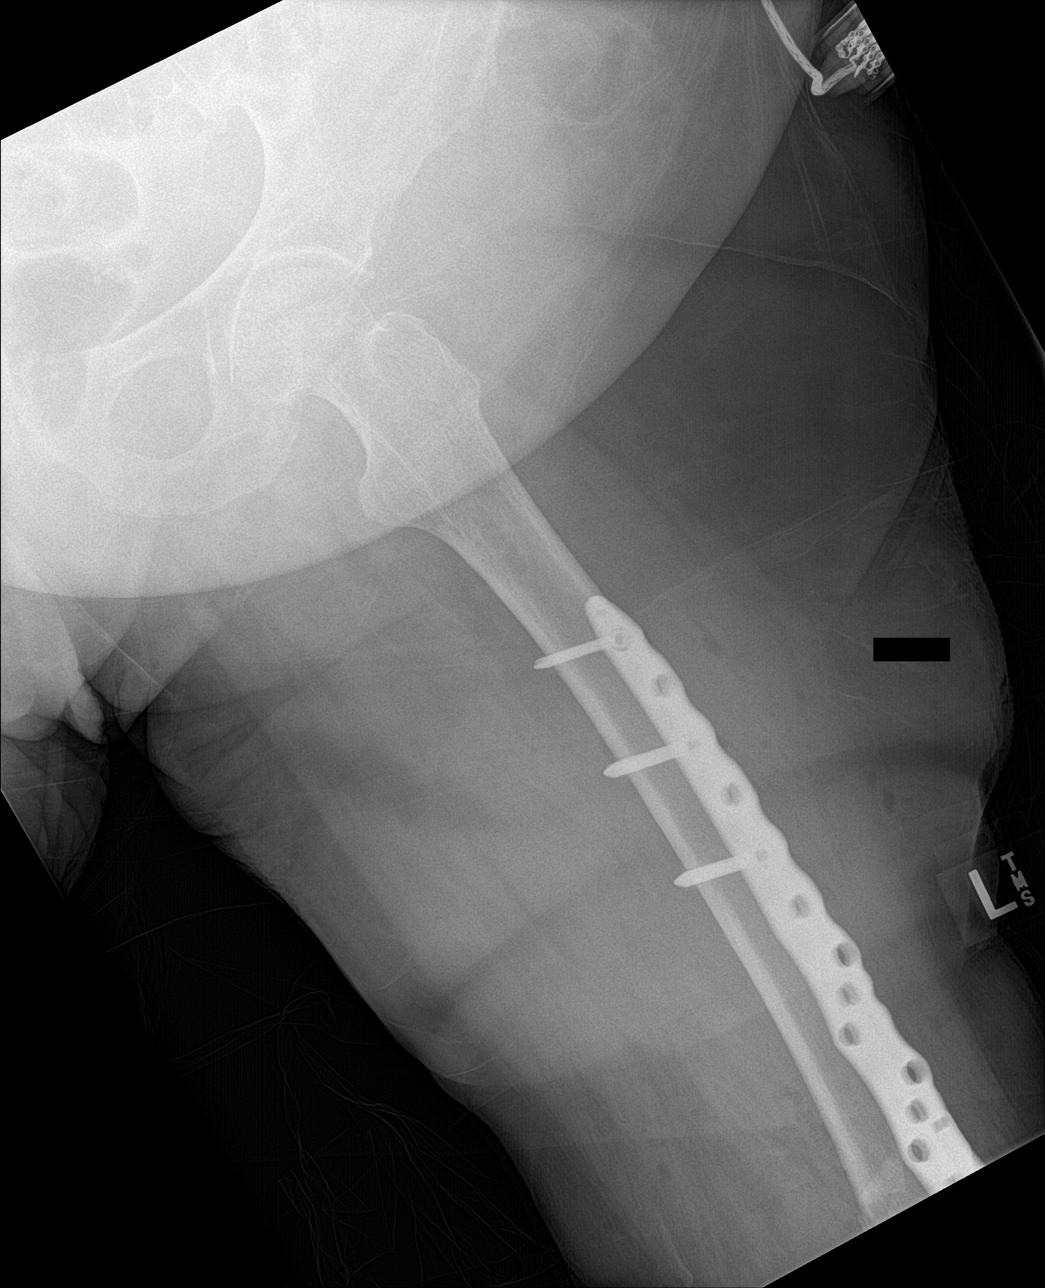
[im 4/4]
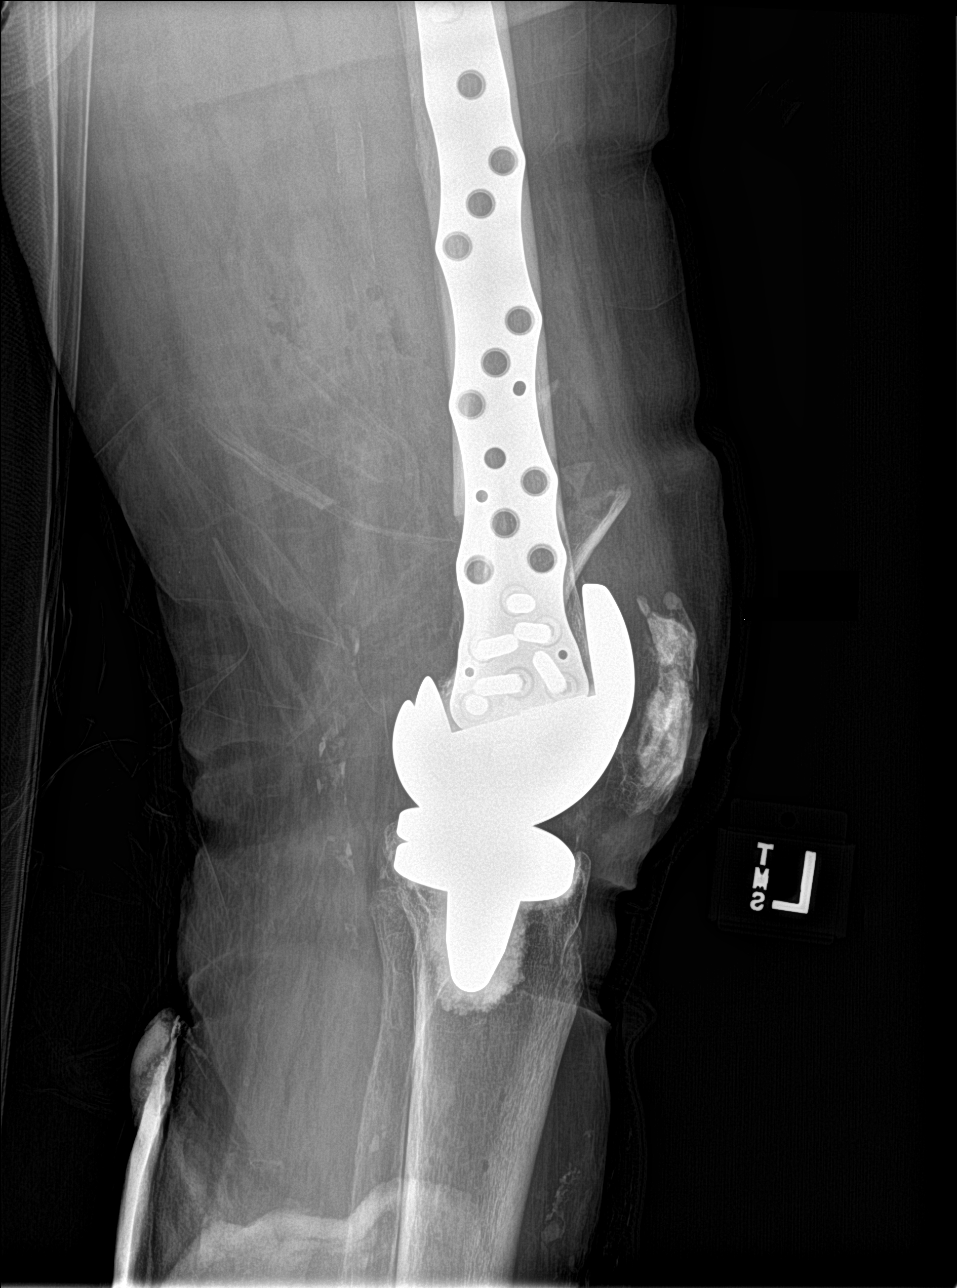

[4 of 4 positions shown; findings below may reference images not displayed]

FINDINGS: Status post plate and screw fixation of the LEFT femur, traversing
the displaced/comminuted fracture within the distal LEFT femur.
Hardware appears intact and appropriately positioned. Osseous
alignment is significantly improved. No evidence of surgical
complicating feature.
IMPRESSION: Status post plate and screw fixation of the LEFT femur.
Significantly improved osseous alignment. No evidence of surgical
complicating feature.

## 2020-06-01 IMAGING — RF DG C-ARM 61-120 MIN
1 series · 6 of 6 positions shown · non-contrast
Comparison: Plain films of the LEFT ankle dated 04/13/2018.

CLINICAL DATA: ORIF Left Ankle

EXAM:
DG C-ARM 61-120 MIN

[Series 1: run · 6 of 6 slices shown]
[im 1/6]
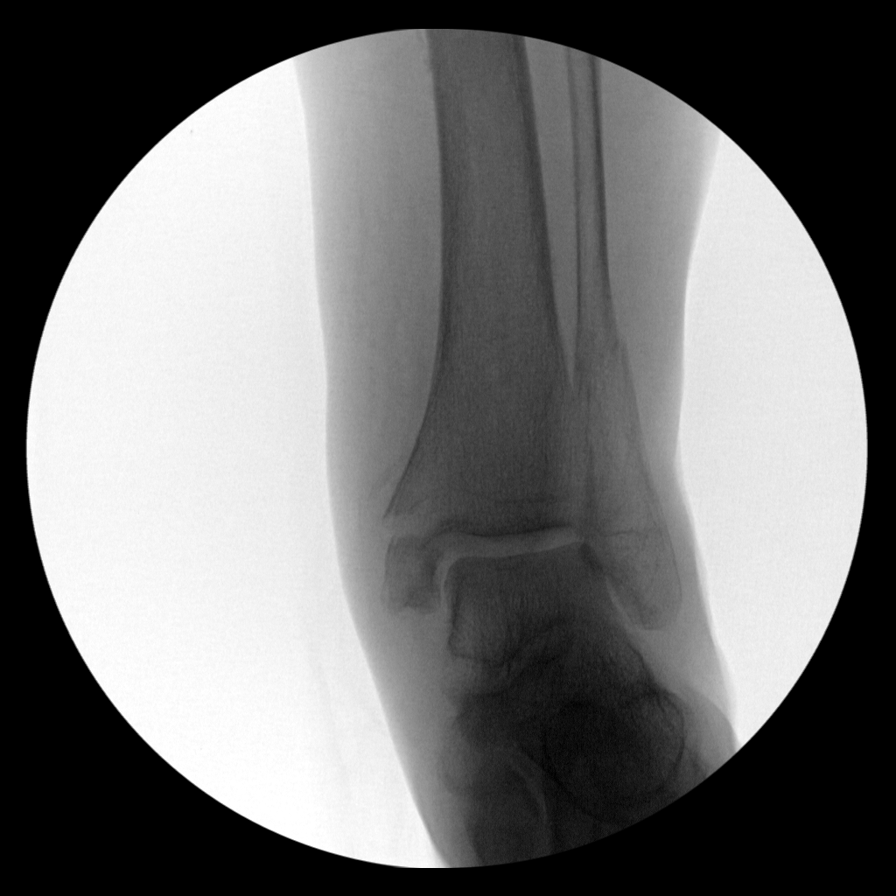
[im 2/6]
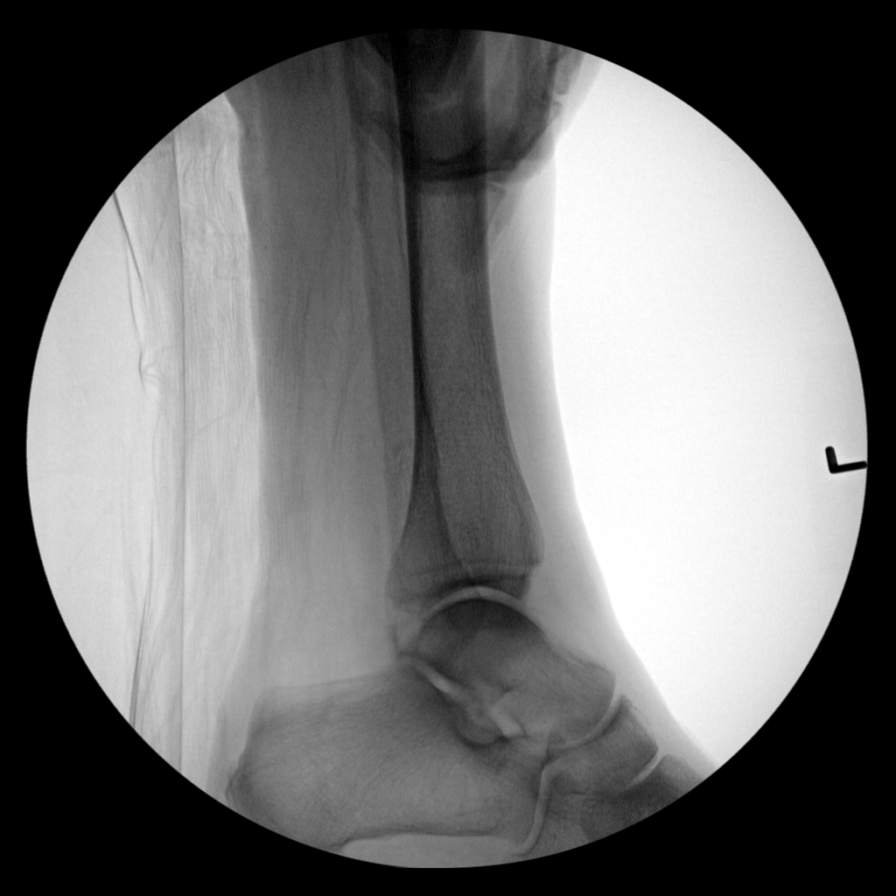
[im 3/6]
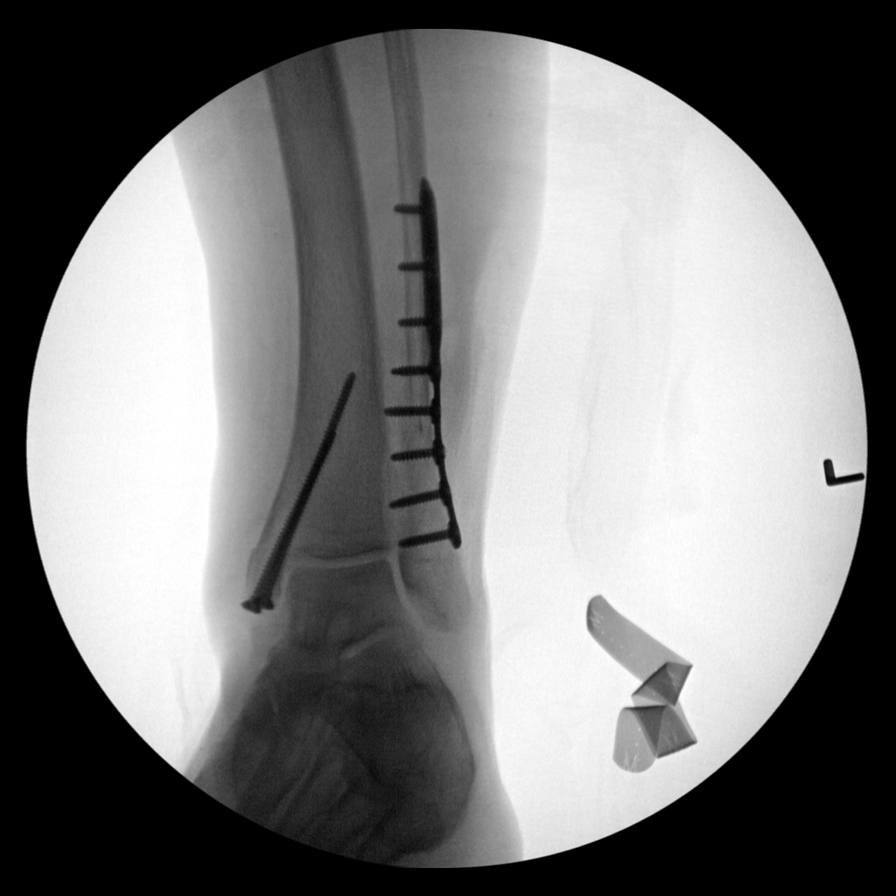
[im 4/6]
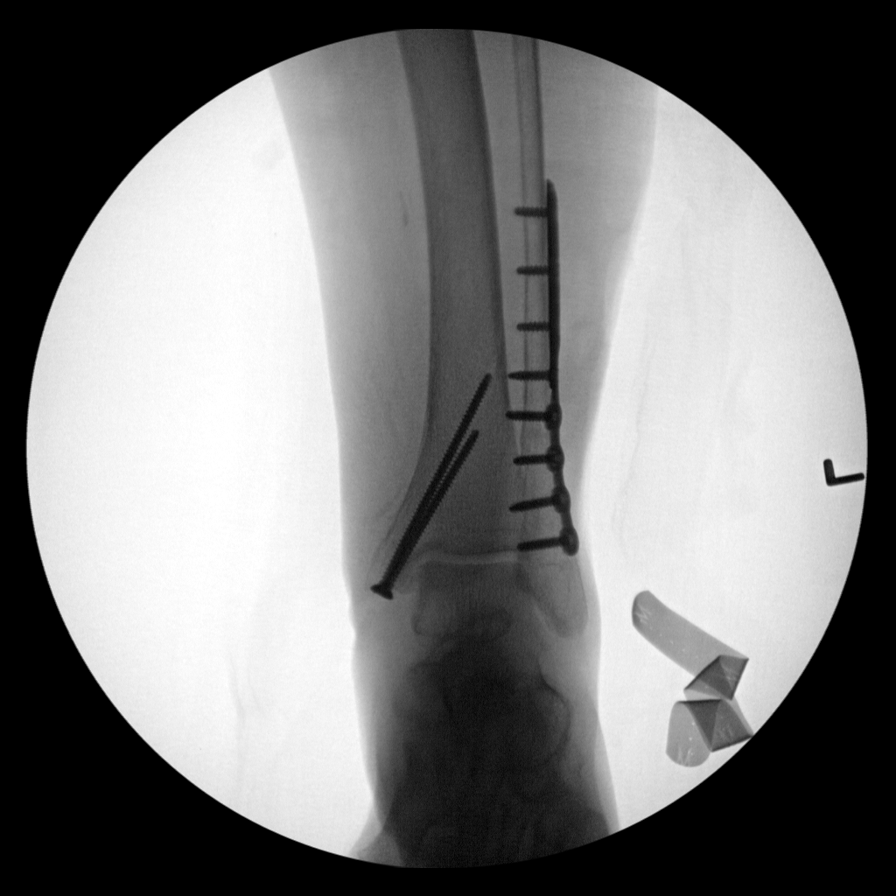
[im 5/6]
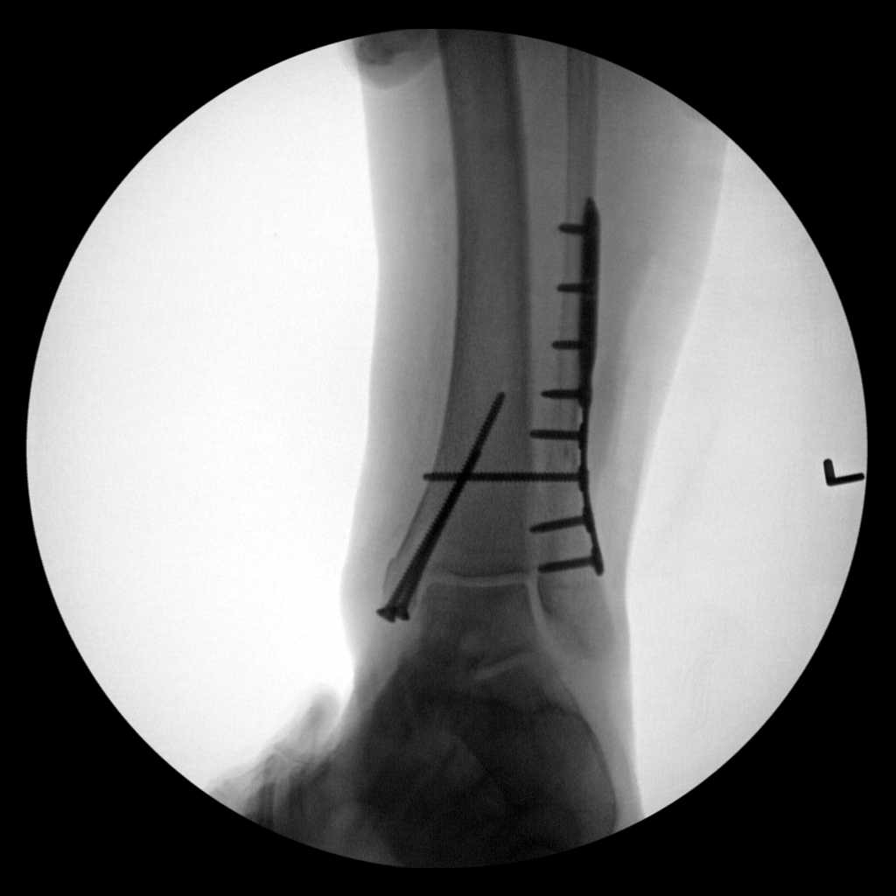
[im 6/6]
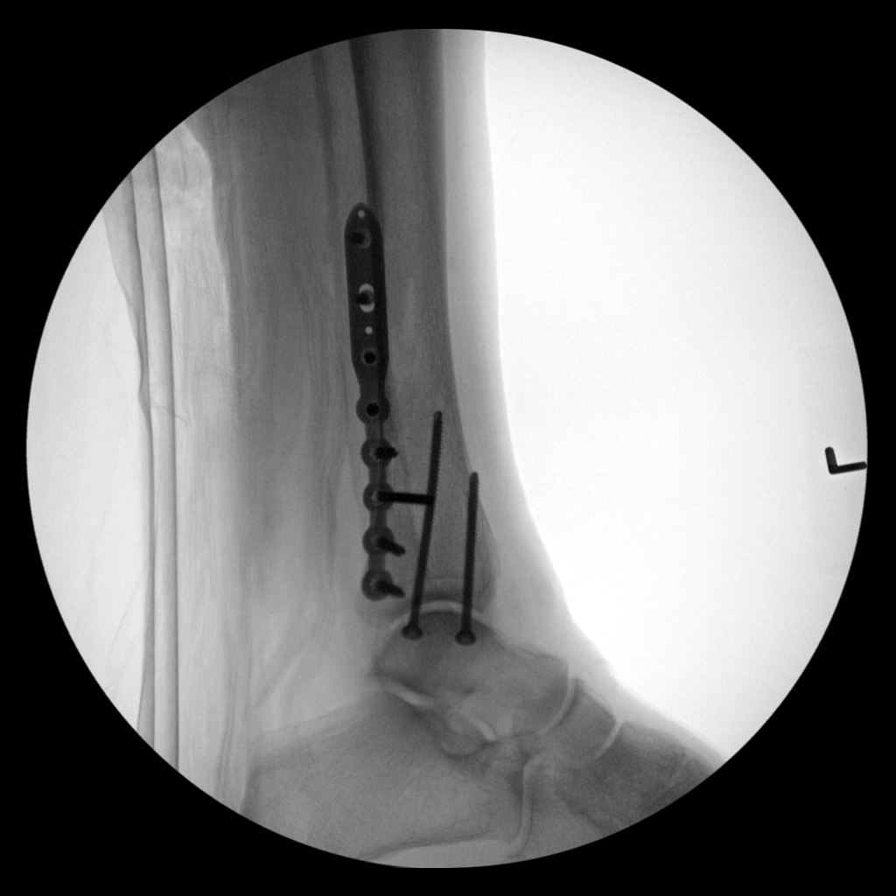

[6 of 6 positions shown; findings below may reference images not displayed]

FINDINGS: Intraoperative fluoroscopic images demonstrating plate and screw
fixation hardware of the distal LEFT tibia and fibula. Hardware
appears intact and appropriately positioned. Osseous alignment is
improved compared to earlier plain films.

Fluoroscopy was provided for 73 seconds.
IMPRESSION: Intraoperative images demonstrating plate and screw fixation
hardware of the distal LEFT tibia and fibula. Fluoroscopy provided
for the procedure.

## 2020-06-03 DIAGNOSIS — F0150 Vascular dementia without behavioral disturbance: Secondary | ICD-10-CM | POA: Diagnosis not present

## 2020-06-03 DIAGNOSIS — I350 Nonrheumatic aortic (valve) stenosis: Secondary | ICD-10-CM | POA: Diagnosis not present

## 2020-06-03 DIAGNOSIS — G309 Alzheimer's disease, unspecified: Secondary | ICD-10-CM | POA: Diagnosis not present

## 2020-06-03 DIAGNOSIS — F0280 Dementia in other diseases classified elsewhere without behavioral disturbance: Secondary | ICD-10-CM | POA: Diagnosis not present

## 2020-06-03 DIAGNOSIS — I69318 Other symptoms and signs involving cognitive functions following cerebral infarction: Secondary | ICD-10-CM | POA: Diagnosis not present

## 2020-06-03 DIAGNOSIS — I739 Peripheral vascular disease, unspecified: Secondary | ICD-10-CM | POA: Diagnosis not present

## 2020-06-04 DIAGNOSIS — F0150 Vascular dementia without behavioral disturbance: Secondary | ICD-10-CM | POA: Diagnosis not present

## 2020-06-04 DIAGNOSIS — I739 Peripheral vascular disease, unspecified: Secondary | ICD-10-CM | POA: Diagnosis not present

## 2020-06-04 DIAGNOSIS — I350 Nonrheumatic aortic (valve) stenosis: Secondary | ICD-10-CM | POA: Diagnosis not present

## 2020-06-04 DIAGNOSIS — F0280 Dementia in other diseases classified elsewhere without behavioral disturbance: Secondary | ICD-10-CM | POA: Diagnosis not present

## 2020-06-04 DIAGNOSIS — G309 Alzheimer's disease, unspecified: Secondary | ICD-10-CM | POA: Diagnosis not present

## 2020-06-04 DIAGNOSIS — I69318 Other symptoms and signs involving cognitive functions following cerebral infarction: Secondary | ICD-10-CM | POA: Diagnosis not present

## 2020-06-06 IMAGING — MR MR HEAD W/O CM
10 of 11 series · 42 of 48 positions shown · non-contrast
Comparison: Head CT 04/13/2018 and MRI 03/02/2016

CLINICAL DATA: Confusion.

EXAM:
MRI HEAD WITHOUT CONTRAST
TECHNIQUE: Multiplanar, multiecho pulse sequences of the brain and surrounding
structures were obtained without intravenous contrast.

[Series 5: ax dwi_tracew · axial · 3.0mm · 1.50mm/px · z∈[-44,+95]mm · 9 of 96 slices shown]
[im 1/96]
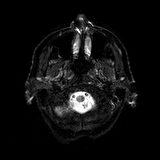
[im 12/96]
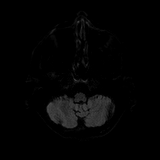
[im 24/96]
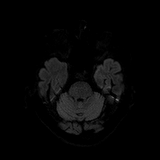
[im 36/96]
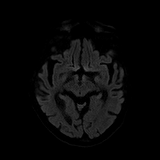
[im 48/96]
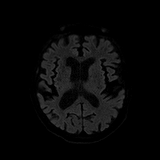
[im 60/96]
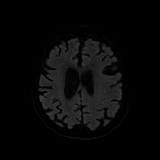
[im 72/96]
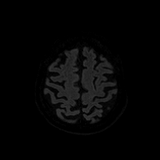
[im 84/96]
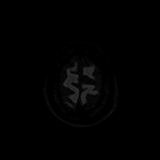
[im 96/96]
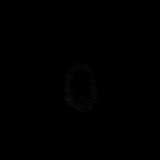

[Series 6: ax dwi_adc · axial · 3.0mm · 1.50mm/px · z∈[-44,+95]mm · 4 of 48 slices shown]
[im 1/48]
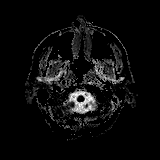
[im 16/48]
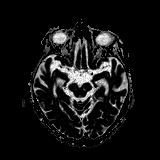
[im 32/48]
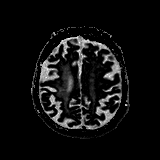
[im 48/48]
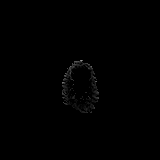

[Series 7: cor dwi_tracew · coronal · 5.0mm · 1.44mm/px · 6 of 64 slices shown]
[im 1/64]
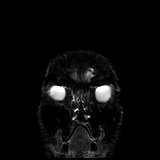
[im 13/64]
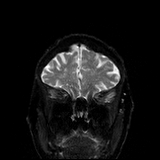
[im 26/64]
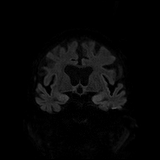
[im 38/64]
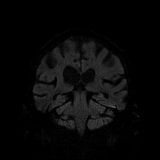
[im 51/64]
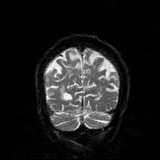
[im 64/64]
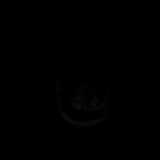

[Series 8: cor dwi_adc · coronal · 5.0mm · 1.44mm/px · 3 of 32 slices shown]
[im 1/32]
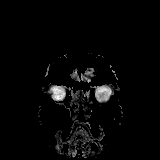
[im 16/32]
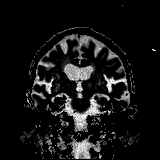
[im 32/32]
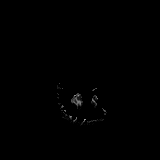

[Series 9: T1 · sagittal · 5.0mm · 0.75mm/px · 2 of 23 slices shown]
[im 1/23]
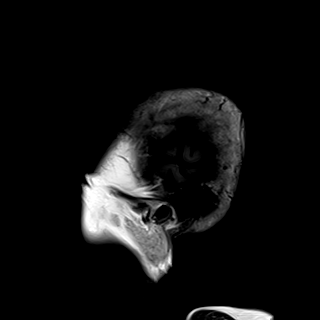
[im 23/23]
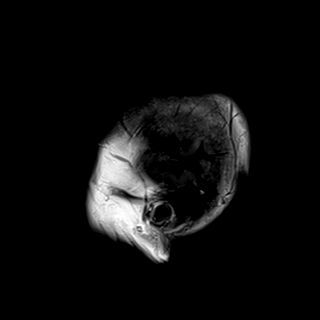

[Series 10: T2 · axial · 5.0mm · 0.72mm/px · z∈[-49,+93]mm · 2 of 25 slices shown (1 of 2)]
[im 1/25]
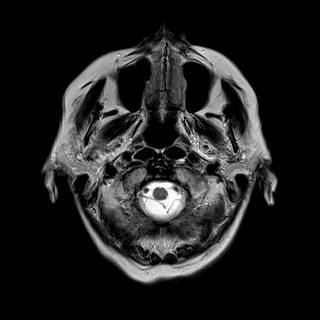
[im 25/25]
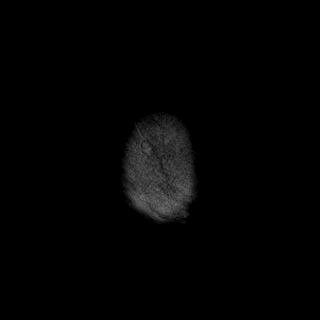

[Series 11: FLAIR · axial · 5.0mm · 0.45mm/px · z∈[-46,+96]mm · 2 of 25 slices shown]
[im 1/25]
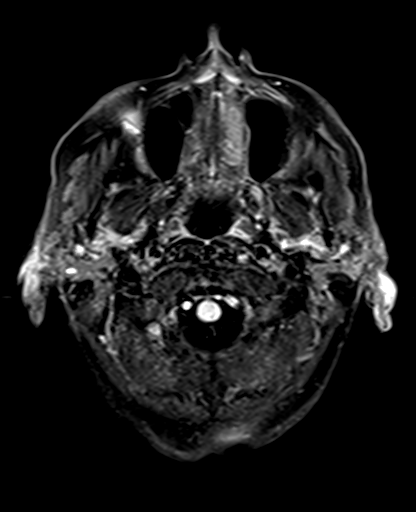
[im 25/25]
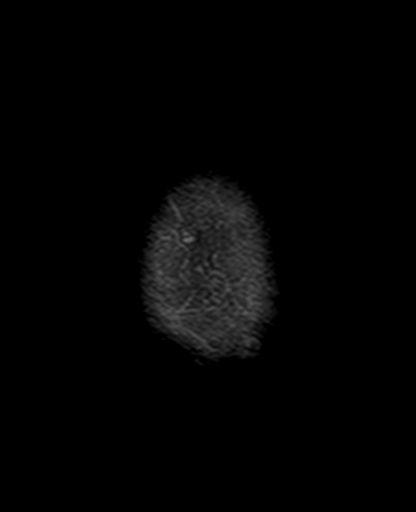

[Series 12: swi_images · axial · 3.0mm · 0.90mm/px · z∈[-60,+114]mm · 6 of 60 slices shown]
[im 1/60]
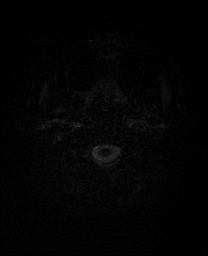
[im 12/60]
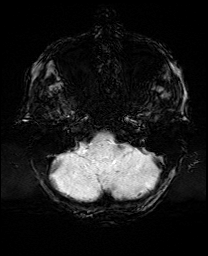
[im 24/60]
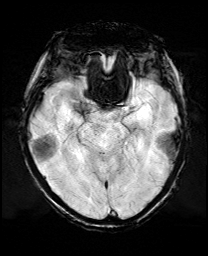
[im 36/60]
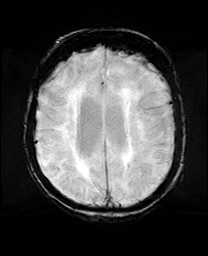
[im 48/60]
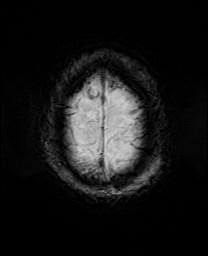
[im 60/60]
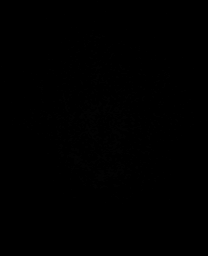

[Series 13: mip_images(sw) · axial · 24.0mm · 0.90mm/px · z∈[-50,+103]mm · 5 of 53 slices shown]
[im 1/53]
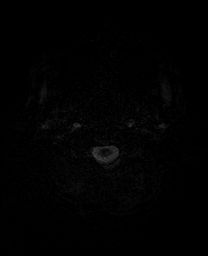
[im 14/53]
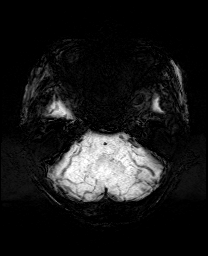
[im 27/53]
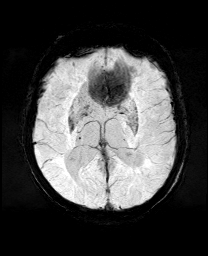
[im 40/53]
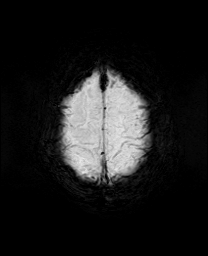
[im 53/53]
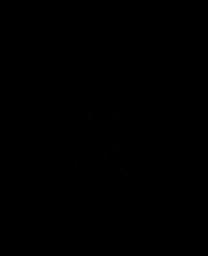

[Series 15: T2 · coronal · 5.0mm · 0.34mm/px · 3 of 29 slices shown (2 of 2)]
[im 1/29]
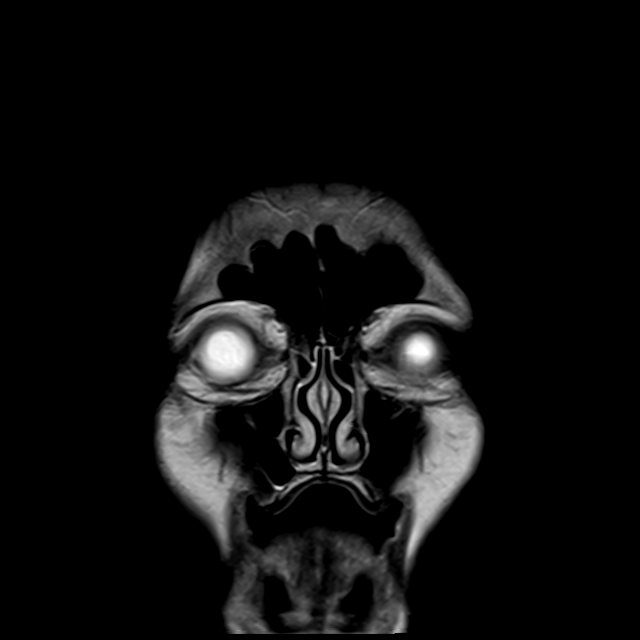
[im 15/29]
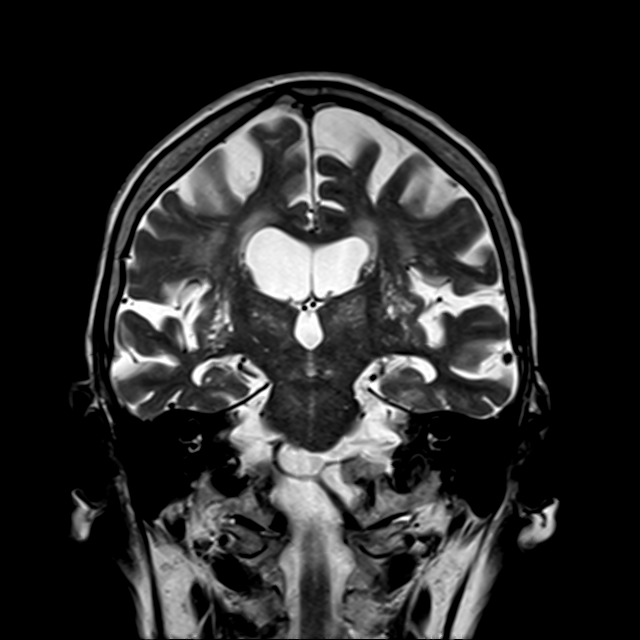
[im 29/29]
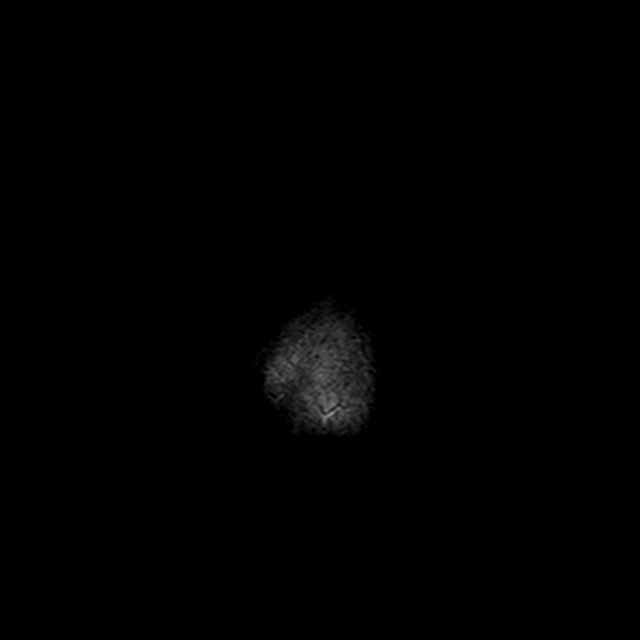

[42 of 48 positions shown; findings below may reference images not displayed]

FINDINGS: Brain: There is no evidence of acute infarct, mass, midline shift,
or extra-axial fluid collection. Chronic microhemorrhages are again
seen in the thalami. There is also a chronic microhemorrhage in the
pons which is new from the prior MRI.

Patchy T2 hyperintensities in the cerebral white matter and pons are
unchanged from the prior MRI and nonspecific but compatible with
moderate chronic small vessel ischemic disease. Numerous dilated
perivascular spaces are again seen throughout the basal ganglia
bilaterally. There also appear to be chronic lacunar infarcts along
the posterior and superior aspects of the right lentiform nucleus.
Small chronic infarcts are also present in the cerebellum
bilaterally. There is moderate cerebral atrophy.

Vascular: Major intracranial vascular flow voids are preserved.

Skull and upper cervical spine: Unremarkable bone marrow signal.

Sinuses/Orbits: Bilateral cataract extraction. Paranasal sinuses and
mastoid air cells are clear.

Other: None.
IMPRESSION: 1. No acute intracranial abnormality.
2. Moderate chronic small vessel ischemic disease and cerebral
atrophy.

## 2020-06-07 DIAGNOSIS — G309 Alzheimer's disease, unspecified: Secondary | ICD-10-CM | POA: Diagnosis not present

## 2020-06-07 DIAGNOSIS — I350 Nonrheumatic aortic (valve) stenosis: Secondary | ICD-10-CM | POA: Diagnosis not present

## 2020-06-07 DIAGNOSIS — F0150 Vascular dementia without behavioral disturbance: Secondary | ICD-10-CM | POA: Diagnosis not present

## 2020-06-07 DIAGNOSIS — I739 Peripheral vascular disease, unspecified: Secondary | ICD-10-CM | POA: Diagnosis not present

## 2020-06-07 DIAGNOSIS — I69318 Other symptoms and signs involving cognitive functions following cerebral infarction: Secondary | ICD-10-CM | POA: Diagnosis not present

## 2020-06-07 DIAGNOSIS — F0280 Dementia in other diseases classified elsewhere without behavioral disturbance: Secondary | ICD-10-CM | POA: Diagnosis not present

## 2020-06-10 DIAGNOSIS — F0280 Dementia in other diseases classified elsewhere without behavioral disturbance: Secondary | ICD-10-CM | POA: Diagnosis not present

## 2020-06-10 DIAGNOSIS — F0150 Vascular dementia without behavioral disturbance: Secondary | ICD-10-CM | POA: Diagnosis not present

## 2020-06-10 DIAGNOSIS — I69318 Other symptoms and signs involving cognitive functions following cerebral infarction: Secondary | ICD-10-CM | POA: Diagnosis not present

## 2020-06-10 DIAGNOSIS — I350 Nonrheumatic aortic (valve) stenosis: Secondary | ICD-10-CM | POA: Diagnosis not present

## 2020-06-10 DIAGNOSIS — I739 Peripheral vascular disease, unspecified: Secondary | ICD-10-CM | POA: Diagnosis not present

## 2020-06-10 DIAGNOSIS — G309 Alzheimer's disease, unspecified: Secondary | ICD-10-CM | POA: Diagnosis not present

## 2020-06-13 DIAGNOSIS — I739 Peripheral vascular disease, unspecified: Secondary | ICD-10-CM | POA: Diagnosis not present

## 2020-06-13 DIAGNOSIS — I350 Nonrheumatic aortic (valve) stenosis: Secondary | ICD-10-CM | POA: Diagnosis not present

## 2020-06-13 DIAGNOSIS — I69318 Other symptoms and signs involving cognitive functions following cerebral infarction: Secondary | ICD-10-CM | POA: Diagnosis not present

## 2020-06-13 DIAGNOSIS — F0280 Dementia in other diseases classified elsewhere without behavioral disturbance: Secondary | ICD-10-CM | POA: Diagnosis not present

## 2020-06-13 DIAGNOSIS — G309 Alzheimer's disease, unspecified: Secondary | ICD-10-CM | POA: Diagnosis not present

## 2020-06-13 DIAGNOSIS — F0150 Vascular dementia without behavioral disturbance: Secondary | ICD-10-CM | POA: Diagnosis not present

## 2020-06-14 DIAGNOSIS — I739 Peripheral vascular disease, unspecified: Secondary | ICD-10-CM | POA: Diagnosis not present

## 2020-06-14 DIAGNOSIS — G309 Alzheimer's disease, unspecified: Secondary | ICD-10-CM | POA: Diagnosis not present

## 2020-06-14 DIAGNOSIS — I69318 Other symptoms and signs involving cognitive functions following cerebral infarction: Secondary | ICD-10-CM | POA: Diagnosis not present

## 2020-06-14 DIAGNOSIS — F0150 Vascular dementia without behavioral disturbance: Secondary | ICD-10-CM | POA: Diagnosis not present

## 2020-06-14 DIAGNOSIS — F0280 Dementia in other diseases classified elsewhere without behavioral disturbance: Secondary | ICD-10-CM | POA: Diagnosis not present

## 2020-06-14 DIAGNOSIS — I350 Nonrheumatic aortic (valve) stenosis: Secondary | ICD-10-CM | POA: Diagnosis not present

## 2020-06-17 DIAGNOSIS — Z8639 Personal history of other endocrine, nutritional and metabolic disease: Secondary | ICD-10-CM | POA: Diagnosis not present

## 2020-06-17 DIAGNOSIS — E785 Hyperlipidemia, unspecified: Secondary | ICD-10-CM | POA: Diagnosis not present

## 2020-06-17 DIAGNOSIS — F0150 Vascular dementia without behavioral disturbance: Secondary | ICD-10-CM | POA: Diagnosis not present

## 2020-06-17 DIAGNOSIS — Z6825 Body mass index (BMI) 25.0-25.9, adult: Secondary | ICD-10-CM | POA: Diagnosis not present

## 2020-06-17 DIAGNOSIS — Z741 Need for assistance with personal care: Secondary | ICD-10-CM | POA: Diagnosis not present

## 2020-06-17 DIAGNOSIS — I739 Peripheral vascular disease, unspecified: Secondary | ICD-10-CM | POA: Diagnosis not present

## 2020-06-17 DIAGNOSIS — R32 Unspecified urinary incontinence: Secondary | ICD-10-CM | POA: Diagnosis not present

## 2020-06-17 DIAGNOSIS — F32A Depression, unspecified: Secondary | ICD-10-CM | POA: Diagnosis not present

## 2020-06-17 DIAGNOSIS — I482 Chronic atrial fibrillation, unspecified: Secondary | ICD-10-CM | POA: Diagnosis not present

## 2020-06-17 DIAGNOSIS — I350 Nonrheumatic aortic (valve) stenosis: Secondary | ICD-10-CM | POA: Diagnosis not present

## 2020-06-17 DIAGNOSIS — F0280 Dementia in other diseases classified elsewhere without behavioral disturbance: Secondary | ICD-10-CM | POA: Diagnosis not present

## 2020-06-17 DIAGNOSIS — G35 Multiple sclerosis: Secondary | ICD-10-CM | POA: Diagnosis not present

## 2020-06-17 DIAGNOSIS — M810 Age-related osteoporosis without current pathological fracture: Secondary | ICD-10-CM | POA: Diagnosis not present

## 2020-06-17 DIAGNOSIS — E039 Hypothyroidism, unspecified: Secondary | ICD-10-CM | POA: Diagnosis not present

## 2020-06-17 DIAGNOSIS — I69318 Other symptoms and signs involving cognitive functions following cerebral infarction: Secondary | ICD-10-CM | POA: Diagnosis not present

## 2020-06-17 DIAGNOSIS — R159 Full incontinence of feces: Secondary | ICD-10-CM | POA: Diagnosis not present

## 2020-06-17 DIAGNOSIS — Z87310 Personal history of (healed) osteoporosis fracture: Secondary | ICD-10-CM | POA: Diagnosis not present

## 2020-06-17 DIAGNOSIS — I10 Essential (primary) hypertension: Secondary | ICD-10-CM | POA: Diagnosis not present

## 2020-06-17 DIAGNOSIS — G309 Alzheimer's disease, unspecified: Secondary | ICD-10-CM | POA: Diagnosis not present

## 2020-06-19 DIAGNOSIS — F0150 Vascular dementia without behavioral disturbance: Secondary | ICD-10-CM | POA: Diagnosis not present

## 2020-06-19 DIAGNOSIS — I350 Nonrheumatic aortic (valve) stenosis: Secondary | ICD-10-CM | POA: Diagnosis not present

## 2020-06-19 DIAGNOSIS — G309 Alzheimer's disease, unspecified: Secondary | ICD-10-CM | POA: Diagnosis not present

## 2020-06-19 DIAGNOSIS — I739 Peripheral vascular disease, unspecified: Secondary | ICD-10-CM | POA: Diagnosis not present

## 2020-06-19 DIAGNOSIS — F0280 Dementia in other diseases classified elsewhere without behavioral disturbance: Secondary | ICD-10-CM | POA: Diagnosis not present

## 2020-06-19 DIAGNOSIS — I69318 Other symptoms and signs involving cognitive functions following cerebral infarction: Secondary | ICD-10-CM | POA: Diagnosis not present

## 2020-06-21 DIAGNOSIS — I69318 Other symptoms and signs involving cognitive functions following cerebral infarction: Secondary | ICD-10-CM | POA: Diagnosis not present

## 2020-06-21 DIAGNOSIS — F0280 Dementia in other diseases classified elsewhere without behavioral disturbance: Secondary | ICD-10-CM | POA: Diagnosis not present

## 2020-06-21 DIAGNOSIS — G309 Alzheimer's disease, unspecified: Secondary | ICD-10-CM | POA: Diagnosis not present

## 2020-06-21 DIAGNOSIS — I350 Nonrheumatic aortic (valve) stenosis: Secondary | ICD-10-CM | POA: Diagnosis not present

## 2020-06-21 DIAGNOSIS — F0150 Vascular dementia without behavioral disturbance: Secondary | ICD-10-CM | POA: Diagnosis not present

## 2020-06-21 DIAGNOSIS — I739 Peripheral vascular disease, unspecified: Secondary | ICD-10-CM | POA: Diagnosis not present

## 2020-06-24 DIAGNOSIS — G309 Alzheimer's disease, unspecified: Secondary | ICD-10-CM | POA: Diagnosis not present

## 2020-06-24 DIAGNOSIS — I350 Nonrheumatic aortic (valve) stenosis: Secondary | ICD-10-CM | POA: Diagnosis not present

## 2020-06-24 DIAGNOSIS — F0150 Vascular dementia without behavioral disturbance: Secondary | ICD-10-CM | POA: Diagnosis not present

## 2020-06-24 DIAGNOSIS — I739 Peripheral vascular disease, unspecified: Secondary | ICD-10-CM | POA: Diagnosis not present

## 2020-06-24 DIAGNOSIS — I69318 Other symptoms and signs involving cognitive functions following cerebral infarction: Secondary | ICD-10-CM | POA: Diagnosis not present

## 2020-06-24 DIAGNOSIS — F0280 Dementia in other diseases classified elsewhere without behavioral disturbance: Secondary | ICD-10-CM | POA: Diagnosis not present

## 2020-06-26 DIAGNOSIS — G309 Alzheimer's disease, unspecified: Secondary | ICD-10-CM | POA: Diagnosis not present

## 2020-06-26 DIAGNOSIS — I739 Peripheral vascular disease, unspecified: Secondary | ICD-10-CM | POA: Diagnosis not present

## 2020-06-26 DIAGNOSIS — I69318 Other symptoms and signs involving cognitive functions following cerebral infarction: Secondary | ICD-10-CM | POA: Diagnosis not present

## 2020-06-26 DIAGNOSIS — F0280 Dementia in other diseases classified elsewhere without behavioral disturbance: Secondary | ICD-10-CM | POA: Diagnosis not present

## 2020-06-26 DIAGNOSIS — F0150 Vascular dementia without behavioral disturbance: Secondary | ICD-10-CM | POA: Diagnosis not present

## 2020-06-26 DIAGNOSIS — I350 Nonrheumatic aortic (valve) stenosis: Secondary | ICD-10-CM | POA: Diagnosis not present

## 2020-06-27 DIAGNOSIS — Z23 Encounter for immunization: Secondary | ICD-10-CM | POA: Diagnosis not present

## 2020-06-28 DIAGNOSIS — I69318 Other symptoms and signs involving cognitive functions following cerebral infarction: Secondary | ICD-10-CM | POA: Diagnosis not present

## 2020-06-28 DIAGNOSIS — G309 Alzheimer's disease, unspecified: Secondary | ICD-10-CM | POA: Diagnosis not present

## 2020-06-28 DIAGNOSIS — I739 Peripheral vascular disease, unspecified: Secondary | ICD-10-CM | POA: Diagnosis not present

## 2020-06-28 DIAGNOSIS — F0280 Dementia in other diseases classified elsewhere without behavioral disturbance: Secondary | ICD-10-CM | POA: Diagnosis not present

## 2020-06-28 DIAGNOSIS — I350 Nonrheumatic aortic (valve) stenosis: Secondary | ICD-10-CM | POA: Diagnosis not present

## 2020-06-28 DIAGNOSIS — F0150 Vascular dementia without behavioral disturbance: Secondary | ICD-10-CM | POA: Diagnosis not present

## 2020-07-01 DIAGNOSIS — F0150 Vascular dementia without behavioral disturbance: Secondary | ICD-10-CM | POA: Diagnosis not present

## 2020-07-01 DIAGNOSIS — F0280 Dementia in other diseases classified elsewhere without behavioral disturbance: Secondary | ICD-10-CM | POA: Diagnosis not present

## 2020-07-01 DIAGNOSIS — I350 Nonrheumatic aortic (valve) stenosis: Secondary | ICD-10-CM | POA: Diagnosis not present

## 2020-07-01 DIAGNOSIS — I739 Peripheral vascular disease, unspecified: Secondary | ICD-10-CM | POA: Diagnosis not present

## 2020-07-01 DIAGNOSIS — I69318 Other symptoms and signs involving cognitive functions following cerebral infarction: Secondary | ICD-10-CM | POA: Diagnosis not present

## 2020-07-01 DIAGNOSIS — G309 Alzheimer's disease, unspecified: Secondary | ICD-10-CM | POA: Diagnosis not present

## 2020-07-02 DIAGNOSIS — F0280 Dementia in other diseases classified elsewhere without behavioral disturbance: Secondary | ICD-10-CM | POA: Diagnosis not present

## 2020-07-02 DIAGNOSIS — I350 Nonrheumatic aortic (valve) stenosis: Secondary | ICD-10-CM | POA: Diagnosis not present

## 2020-07-02 DIAGNOSIS — G309 Alzheimer's disease, unspecified: Secondary | ICD-10-CM | POA: Diagnosis not present

## 2020-07-02 DIAGNOSIS — I739 Peripheral vascular disease, unspecified: Secondary | ICD-10-CM | POA: Diagnosis not present

## 2020-07-02 DIAGNOSIS — F0150 Vascular dementia without behavioral disturbance: Secondary | ICD-10-CM | POA: Diagnosis not present

## 2020-07-02 DIAGNOSIS — I69318 Other symptoms and signs involving cognitive functions following cerebral infarction: Secondary | ICD-10-CM | POA: Diagnosis not present

## 2020-07-05 DIAGNOSIS — I739 Peripheral vascular disease, unspecified: Secondary | ICD-10-CM | POA: Diagnosis not present

## 2020-07-05 DIAGNOSIS — I350 Nonrheumatic aortic (valve) stenosis: Secondary | ICD-10-CM | POA: Diagnosis not present

## 2020-07-05 DIAGNOSIS — G309 Alzheimer's disease, unspecified: Secondary | ICD-10-CM | POA: Diagnosis not present

## 2020-07-05 DIAGNOSIS — I69318 Other symptoms and signs involving cognitive functions following cerebral infarction: Secondary | ICD-10-CM | POA: Diagnosis not present

## 2020-07-05 DIAGNOSIS — F0150 Vascular dementia without behavioral disturbance: Secondary | ICD-10-CM | POA: Diagnosis not present

## 2020-07-05 DIAGNOSIS — F0280 Dementia in other diseases classified elsewhere without behavioral disturbance: Secondary | ICD-10-CM | POA: Diagnosis not present

## 2020-07-08 DIAGNOSIS — I739 Peripheral vascular disease, unspecified: Secondary | ICD-10-CM | POA: Diagnosis not present

## 2020-07-08 DIAGNOSIS — F0280 Dementia in other diseases classified elsewhere without behavioral disturbance: Secondary | ICD-10-CM | POA: Diagnosis not present

## 2020-07-08 DIAGNOSIS — G309 Alzheimer's disease, unspecified: Secondary | ICD-10-CM | POA: Diagnosis not present

## 2020-07-08 DIAGNOSIS — I350 Nonrheumatic aortic (valve) stenosis: Secondary | ICD-10-CM | POA: Diagnosis not present

## 2020-07-08 DIAGNOSIS — F0150 Vascular dementia without behavioral disturbance: Secondary | ICD-10-CM | POA: Diagnosis not present

## 2020-07-08 DIAGNOSIS — I69318 Other symptoms and signs involving cognitive functions following cerebral infarction: Secondary | ICD-10-CM | POA: Diagnosis not present

## 2020-07-10 DIAGNOSIS — I350 Nonrheumatic aortic (valve) stenosis: Secondary | ICD-10-CM | POA: Diagnosis not present

## 2020-07-10 DIAGNOSIS — G309 Alzheimer's disease, unspecified: Secondary | ICD-10-CM | POA: Diagnosis not present

## 2020-07-10 DIAGNOSIS — F0280 Dementia in other diseases classified elsewhere without behavioral disturbance: Secondary | ICD-10-CM | POA: Diagnosis not present

## 2020-07-10 DIAGNOSIS — F0150 Vascular dementia without behavioral disturbance: Secondary | ICD-10-CM | POA: Diagnosis not present

## 2020-07-10 DIAGNOSIS — I739 Peripheral vascular disease, unspecified: Secondary | ICD-10-CM | POA: Diagnosis not present

## 2020-07-10 DIAGNOSIS — I69318 Other symptoms and signs involving cognitive functions following cerebral infarction: Secondary | ICD-10-CM | POA: Diagnosis not present

## 2020-07-12 DIAGNOSIS — G309 Alzheimer's disease, unspecified: Secondary | ICD-10-CM | POA: Diagnosis not present

## 2020-07-12 DIAGNOSIS — I69318 Other symptoms and signs involving cognitive functions following cerebral infarction: Secondary | ICD-10-CM | POA: Diagnosis not present

## 2020-07-12 DIAGNOSIS — F0280 Dementia in other diseases classified elsewhere without behavioral disturbance: Secondary | ICD-10-CM | POA: Diagnosis not present

## 2020-07-12 DIAGNOSIS — I739 Peripheral vascular disease, unspecified: Secondary | ICD-10-CM | POA: Diagnosis not present

## 2020-07-12 DIAGNOSIS — F0150 Vascular dementia without behavioral disturbance: Secondary | ICD-10-CM | POA: Diagnosis not present

## 2020-07-12 DIAGNOSIS — I350 Nonrheumatic aortic (valve) stenosis: Secondary | ICD-10-CM | POA: Diagnosis not present

## 2020-07-15 DIAGNOSIS — Z7689 Persons encountering health services in other specified circumstances: Secondary | ICD-10-CM | POA: Diagnosis not present

## 2020-07-15 DIAGNOSIS — F0280 Dementia in other diseases classified elsewhere without behavioral disturbance: Secondary | ICD-10-CM | POA: Diagnosis not present

## 2020-07-15 DIAGNOSIS — G309 Alzheimer's disease, unspecified: Secondary | ICD-10-CM | POA: Diagnosis not present

## 2020-07-15 DIAGNOSIS — I69318 Other symptoms and signs involving cognitive functions following cerebral infarction: Secondary | ICD-10-CM | POA: Diagnosis not present

## 2020-07-15 DIAGNOSIS — I739 Peripheral vascular disease, unspecified: Secondary | ICD-10-CM | POA: Diagnosis not present

## 2020-07-15 DIAGNOSIS — I350 Nonrheumatic aortic (valve) stenosis: Secondary | ICD-10-CM | POA: Diagnosis not present

## 2020-07-15 DIAGNOSIS — F0150 Vascular dementia without behavioral disturbance: Secondary | ICD-10-CM | POA: Diagnosis not present

## 2020-07-16 DIAGNOSIS — I739 Peripheral vascular disease, unspecified: Secondary | ICD-10-CM | POA: Diagnosis not present

## 2020-07-16 DIAGNOSIS — F0150 Vascular dementia without behavioral disturbance: Secondary | ICD-10-CM | POA: Diagnosis not present

## 2020-07-16 DIAGNOSIS — I69318 Other symptoms and signs involving cognitive functions following cerebral infarction: Secondary | ICD-10-CM | POA: Diagnosis not present

## 2020-07-16 DIAGNOSIS — I350 Nonrheumatic aortic (valve) stenosis: Secondary | ICD-10-CM | POA: Diagnosis not present

## 2020-07-16 DIAGNOSIS — G309 Alzheimer's disease, unspecified: Secondary | ICD-10-CM | POA: Diagnosis not present

## 2020-07-16 DIAGNOSIS — F0280 Dementia in other diseases classified elsewhere without behavioral disturbance: Secondary | ICD-10-CM | POA: Diagnosis not present

## 2020-07-17 DIAGNOSIS — Z741 Need for assistance with personal care: Secondary | ICD-10-CM | POA: Diagnosis not present

## 2020-07-17 DIAGNOSIS — I482 Chronic atrial fibrillation, unspecified: Secondary | ICD-10-CM | POA: Diagnosis not present

## 2020-07-17 DIAGNOSIS — R441 Visual hallucinations: Secondary | ICD-10-CM | POA: Diagnosis not present

## 2020-07-17 DIAGNOSIS — Z87310 Personal history of (healed) osteoporosis fracture: Secondary | ICD-10-CM | POA: Diagnosis not present

## 2020-07-17 DIAGNOSIS — Z6823 Body mass index (BMI) 23.0-23.9, adult: Secondary | ICD-10-CM | POA: Diagnosis not present

## 2020-07-17 DIAGNOSIS — Z8639 Personal history of other endocrine, nutritional and metabolic disease: Secondary | ICD-10-CM | POA: Diagnosis not present

## 2020-07-17 DIAGNOSIS — R159 Full incontinence of feces: Secondary | ICD-10-CM | POA: Diagnosis not present

## 2020-07-17 DIAGNOSIS — I69318 Other symptoms and signs involving cognitive functions following cerebral infarction: Secondary | ICD-10-CM | POA: Diagnosis not present

## 2020-07-17 DIAGNOSIS — E785 Hyperlipidemia, unspecified: Secondary | ICD-10-CM | POA: Diagnosis not present

## 2020-07-17 DIAGNOSIS — L89521 Pressure ulcer of left ankle, stage 1: Secondary | ICD-10-CM | POA: Diagnosis not present

## 2020-07-17 DIAGNOSIS — M810 Age-related osteoporosis without current pathological fracture: Secondary | ICD-10-CM | POA: Diagnosis not present

## 2020-07-17 DIAGNOSIS — G309 Alzheimer's disease, unspecified: Secondary | ICD-10-CM | POA: Diagnosis not present

## 2020-07-17 DIAGNOSIS — G35 Multiple sclerosis: Secondary | ICD-10-CM | POA: Diagnosis not present

## 2020-07-17 DIAGNOSIS — F0280 Dementia in other diseases classified elsewhere without behavioral disturbance: Secondary | ICD-10-CM | POA: Diagnosis not present

## 2020-07-17 DIAGNOSIS — I739 Peripheral vascular disease, unspecified: Secondary | ICD-10-CM | POA: Diagnosis not present

## 2020-07-17 DIAGNOSIS — I350 Nonrheumatic aortic (valve) stenosis: Secondary | ICD-10-CM | POA: Diagnosis not present

## 2020-07-17 DIAGNOSIS — F32A Depression, unspecified: Secondary | ICD-10-CM | POA: Diagnosis not present

## 2020-07-17 DIAGNOSIS — I10 Essential (primary) hypertension: Secondary | ICD-10-CM | POA: Diagnosis not present

## 2020-07-17 DIAGNOSIS — R32 Unspecified urinary incontinence: Secondary | ICD-10-CM | POA: Diagnosis not present

## 2020-07-17 DIAGNOSIS — F0150 Vascular dementia without behavioral disturbance: Secondary | ICD-10-CM | POA: Diagnosis not present

## 2020-07-17 DIAGNOSIS — E039 Hypothyroidism, unspecified: Secondary | ICD-10-CM | POA: Diagnosis not present

## 2020-07-18 DIAGNOSIS — F0280 Dementia in other diseases classified elsewhere without behavioral disturbance: Secondary | ICD-10-CM | POA: Diagnosis not present

## 2020-07-18 DIAGNOSIS — I350 Nonrheumatic aortic (valve) stenosis: Secondary | ICD-10-CM | POA: Diagnosis not present

## 2020-07-18 DIAGNOSIS — F0150 Vascular dementia without behavioral disturbance: Secondary | ICD-10-CM | POA: Diagnosis not present

## 2020-07-18 DIAGNOSIS — G309 Alzheimer's disease, unspecified: Secondary | ICD-10-CM | POA: Diagnosis not present

## 2020-07-18 DIAGNOSIS — I69318 Other symptoms and signs involving cognitive functions following cerebral infarction: Secondary | ICD-10-CM | POA: Diagnosis not present

## 2020-07-18 DIAGNOSIS — I739 Peripheral vascular disease, unspecified: Secondary | ICD-10-CM | POA: Diagnosis not present

## 2020-07-19 DIAGNOSIS — I739 Peripheral vascular disease, unspecified: Secondary | ICD-10-CM | POA: Diagnosis not present

## 2020-07-19 DIAGNOSIS — F0280 Dementia in other diseases classified elsewhere without behavioral disturbance: Secondary | ICD-10-CM | POA: Diagnosis not present

## 2020-07-19 DIAGNOSIS — F0150 Vascular dementia without behavioral disturbance: Secondary | ICD-10-CM | POA: Diagnosis not present

## 2020-07-19 DIAGNOSIS — G309 Alzheimer's disease, unspecified: Secondary | ICD-10-CM | POA: Diagnosis not present

## 2020-07-19 DIAGNOSIS — I350 Nonrheumatic aortic (valve) stenosis: Secondary | ICD-10-CM | POA: Diagnosis not present

## 2020-07-19 DIAGNOSIS — I69318 Other symptoms and signs involving cognitive functions following cerebral infarction: Secondary | ICD-10-CM | POA: Diagnosis not present

## 2020-07-20 DIAGNOSIS — F0280 Dementia in other diseases classified elsewhere without behavioral disturbance: Secondary | ICD-10-CM | POA: Diagnosis not present

## 2020-07-20 DIAGNOSIS — G309 Alzheimer's disease, unspecified: Secondary | ICD-10-CM | POA: Diagnosis not present

## 2020-07-20 DIAGNOSIS — F0150 Vascular dementia without behavioral disturbance: Secondary | ICD-10-CM | POA: Diagnosis not present

## 2020-07-20 DIAGNOSIS — I739 Peripheral vascular disease, unspecified: Secondary | ICD-10-CM | POA: Diagnosis not present

## 2020-07-20 DIAGNOSIS — I69318 Other symptoms and signs involving cognitive functions following cerebral infarction: Secondary | ICD-10-CM | POA: Diagnosis not present

## 2020-07-20 DIAGNOSIS — I350 Nonrheumatic aortic (valve) stenosis: Secondary | ICD-10-CM | POA: Diagnosis not present

## 2020-08-17 DEATH — deceased

## 2020-08-20 IMAGING — CT CT HEAD CODE STROKE
4 series · 16 of 47 positions shown, 18 images · non-contrast
Comparison: Brain MRI 04/19/2018 [address] CT 04/13/2018

CLINICAL DATA: Code stroke. Right leg weakness, facial droop, and
slurred speech.

EXAM:
CT HEAD WITHOUT CONTRAST
TECHNIQUE: Contiguous axial images were obtained from the base of the skull
through the vertex without intravenous contrast.

[Series 3: head wo · axial · 0.43mm/px · z∈[+213,+333]mm · 7 of 33 slices shown, 9 images]
[im 5/33  brain]
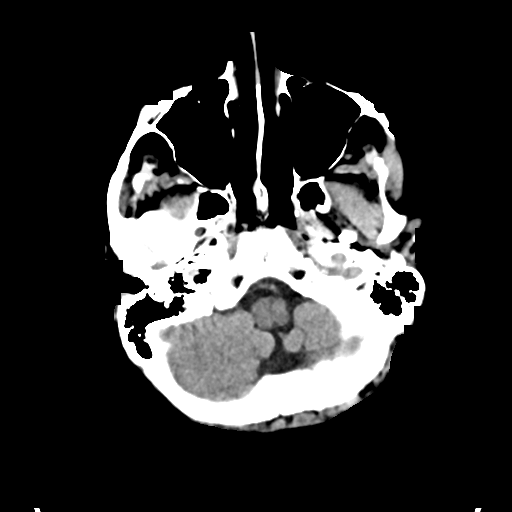
[im 5/33  bone]
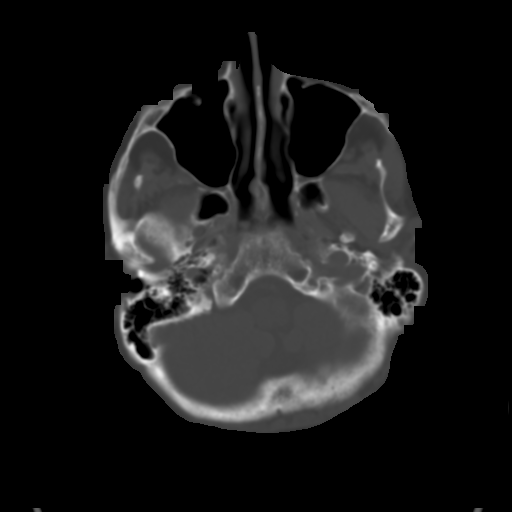
[im 9/33  brain]
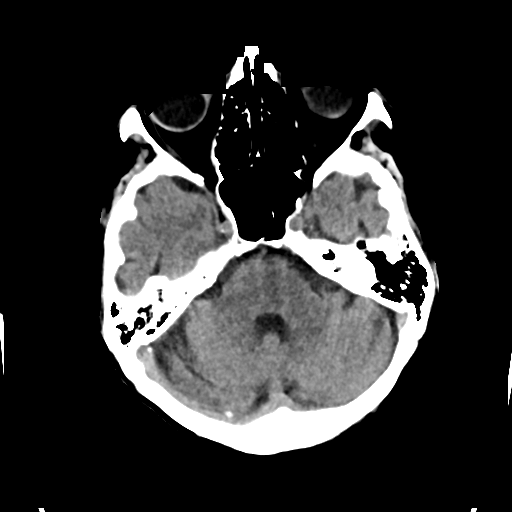
[im 13/33  brain]
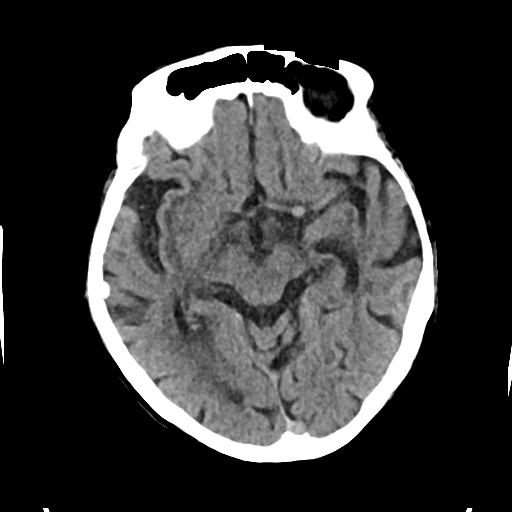
[im 17/33  brain]
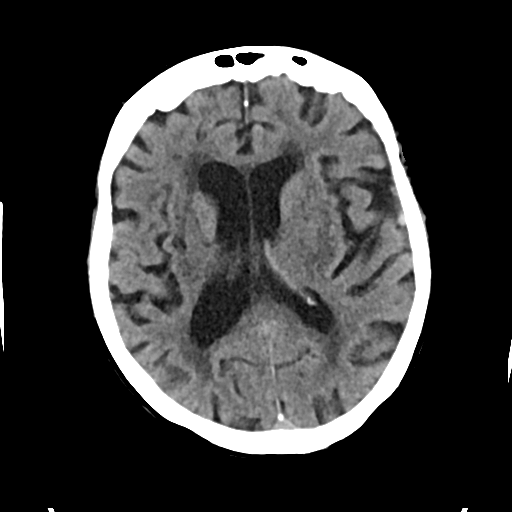
[im 21/33  brain]
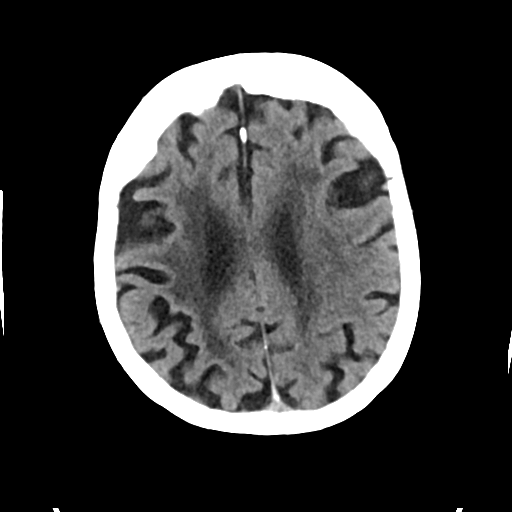
[im 21/33  bone]
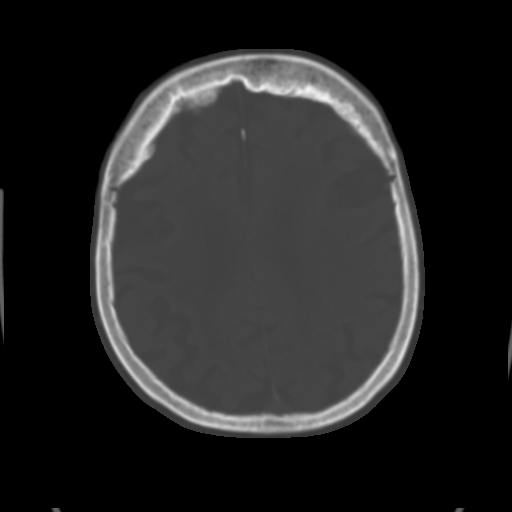
[im 25/33  brain]
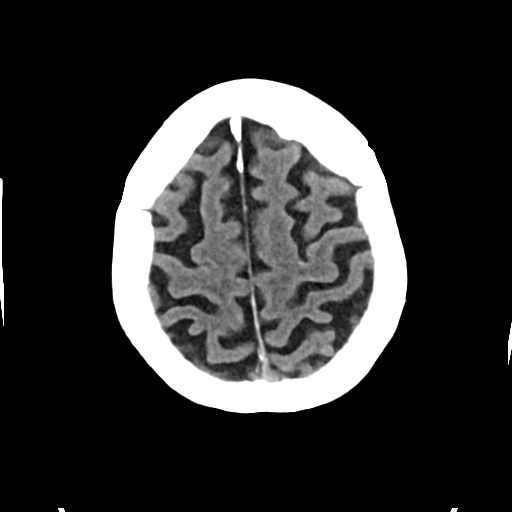
[im 29/33  brain]
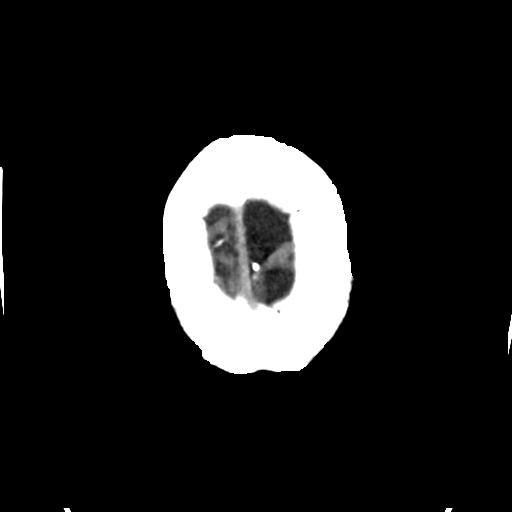

[Series 4: head bone · axial · 0.43mm/px · z∈[+209,+241]mm · 3 of 81 slices shown]
[im 9/81  bone]
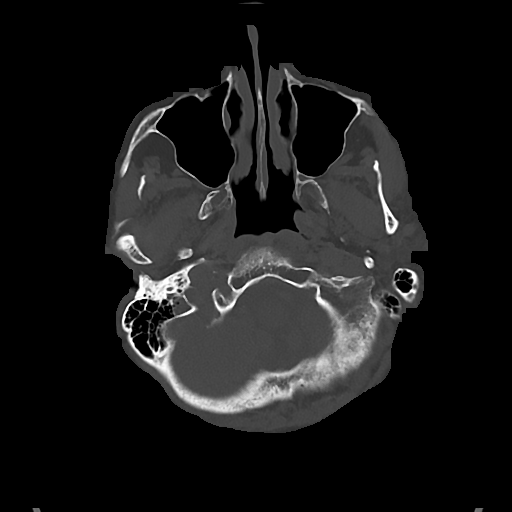
[im 17/81  bone]
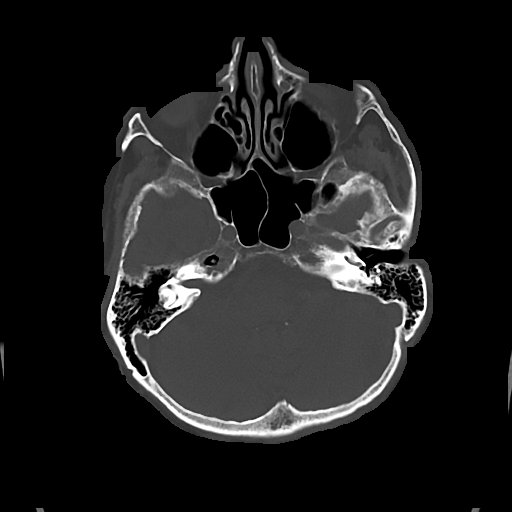
[im 25/81  bone]
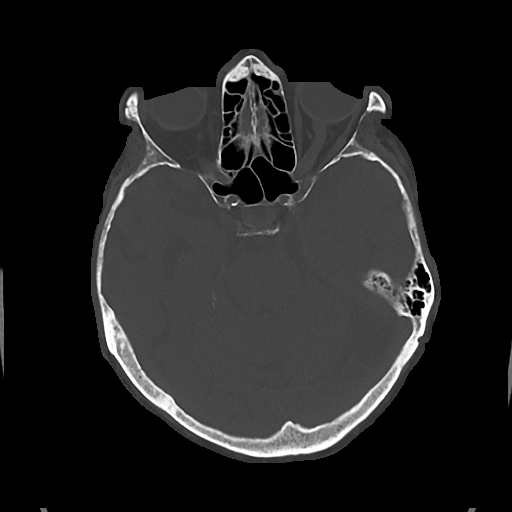

[Series 5: cor soft · coronal · 0.34mm/px · 3 of 63 slices shown]
[im 21/63  brain]
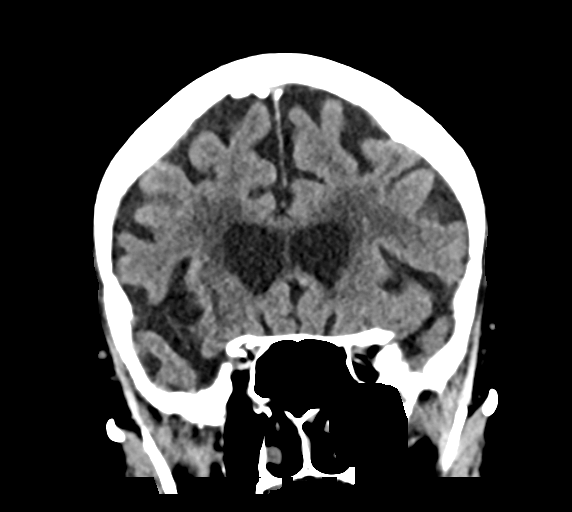
[im 28/63  brain]
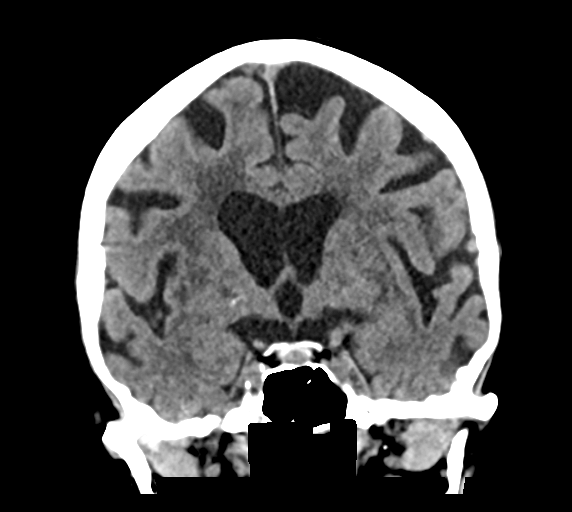
[im 35/63  brain]
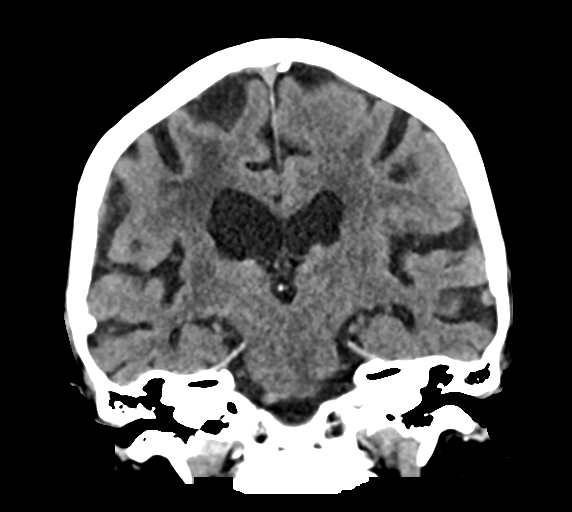

[Series 6: sag soft · sagittal · 0.34mm/px · 3 of 52 slices shown]
[im 18/52  brain]
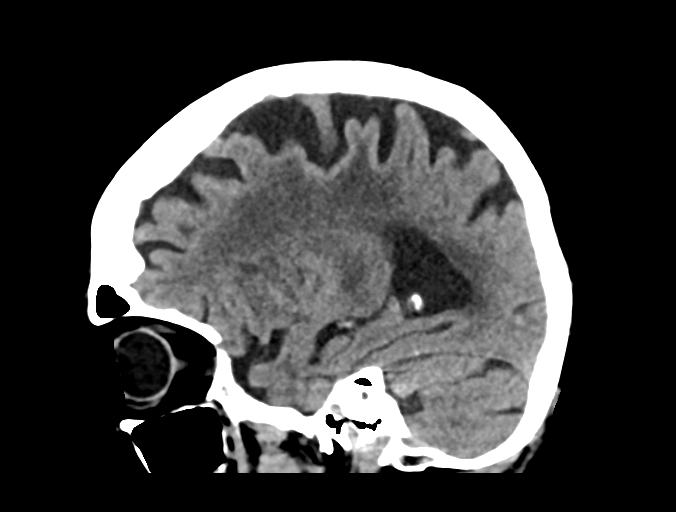
[im 26/52  brain]
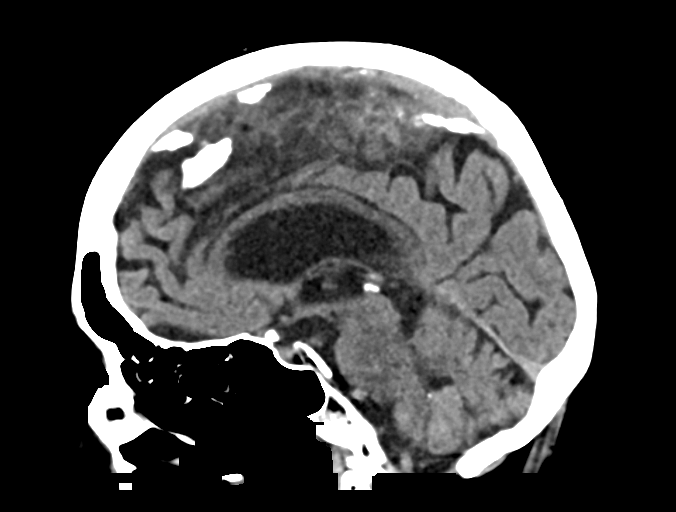
[im 35/52  brain]
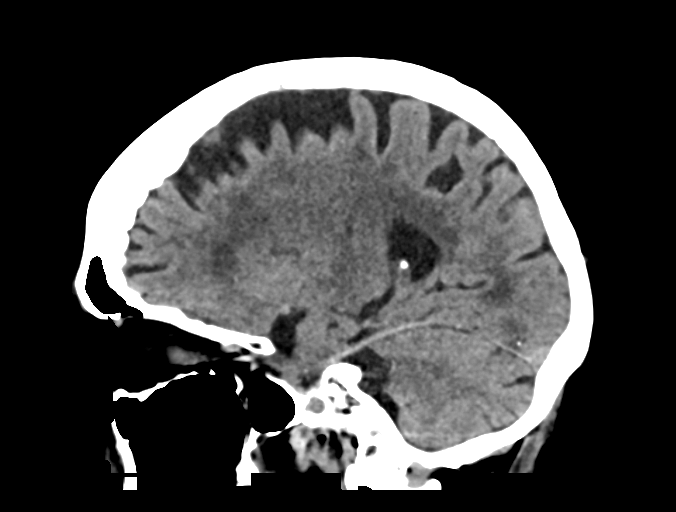

[16 of 47 positions shown; findings below may reference images not displayed]

FINDINGS: Brain: There is no evidence of acute infarct, intracranial
hemorrhage, mass, midline shift, or extra-axial fluid collection.
Patchy to confluent hypodensities are again seen in the cerebral
white matter bilaterally, similar to the prior CT and compatible
with moderate chronic small vessel ischemic disease. Small chronic
cerebellar infarcts on MRI are not well demonstrated by CT.
Heterogeneous hypoattenuation in the basal ganglia corresponds to
dilated perivascular spaces and right-sided lacunar infarcts on MRI.
There is moderate cerebral atrophy.

Vascular: Calcified atherosclerosis at the skull base. No hyperdense
vessel.

Skull: No fracture or focal osseous lesion.

Sinuses/Orbits: Visualized paranasal sinuses and mastoid air cells
are clear. Bilateral cataract extraction is noted.

Other: None.

ASPECTS ([place]ber[place] Stroke Program Early CT Score)

- Ganglionic level infarction (caudate, lentiform nuclei, internal
capsule, insula, M1-M3 cortex): 7

- Supraganglionic infarction (M4-M6 cortex): 3

Total score (0-10 with 10 being normal): 10
IMPRESSION: 1. No evidence of acute intracranial abnormality.
2. ASPECTS is 10.
3. Moderate chronic small vessel ischemic disease and cerebral
atrophy.

These results were communicated to Dr. Nusi at [DATE] on
07/03/2018 by text page via the [employer] messaging system.

## 2020-08-20 IMAGING — MR MR MRA HEAD W/O CM
24 series · 24 of 24 positions shown · IV contrast (gadavist)
Comparison: Head CT without contrast earlier today. Brain MRI
04/19/2018.

CLINICAL DATA: [AGE] female with right leg weakness, facial
droop and slurred speech.

EXAM:
MRI HEAD WITHOUT CONTRAST
MRA HEAD WITHOUT CONTRAST
TECHNIQUE: Multiplanar, multiecho pulse sequences of the brain and surrounding
structures were obtained without intravenous contrast. Angiographic
images of the head were obtained using MRA technique without
contrast.
CONTRAST:  7 milliliters of Gadavist was administered for attempted
Neck MRA, but for unclear technical reasons that study was
nondiagnostic (both the time-of-flight and post-contrast portions)
and is not being charged.
No postcontrast imaging of the brain was requested or obtained.

[Series 3: DWI · axial · 3.0mm · 0.94mm/px · 1 of 104 slices shown (1 of 2)]
[im 1/104]
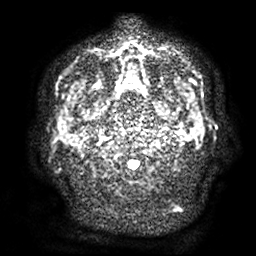

[Series 4: TOF · axial · 2.4mm · 0.47mm/px · 1 of 136 slices shown (1 of 6)]
[im 1/136]
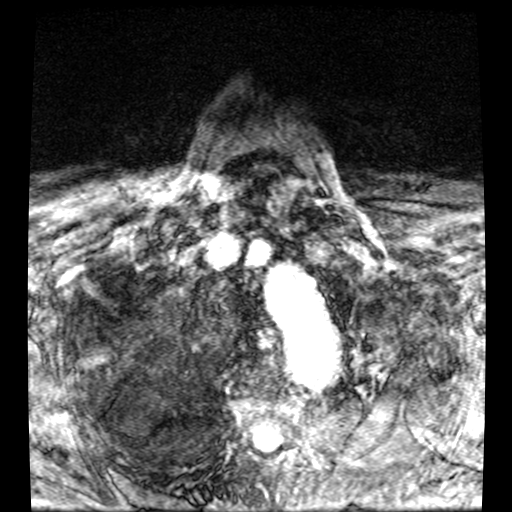

[Series 4: ax (id) 2 · axial · 1.0mm · 0.43mm/px · 1 of 184 slices shown]
[im 1/184]
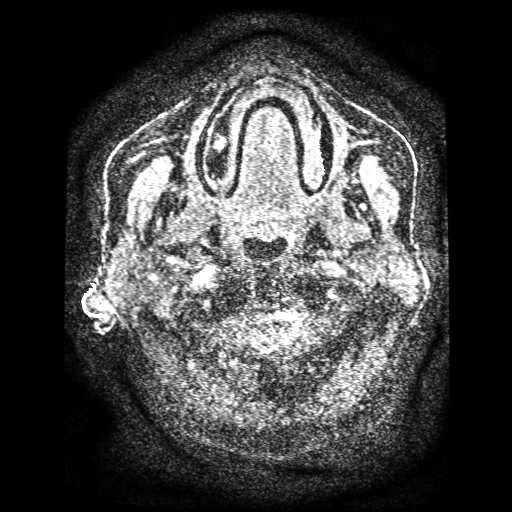

[Series 5: TOF · axial · 2.4mm · 0.47mm/px · 1 of 136 slices shown (2 of 6)]
[im 1/136]
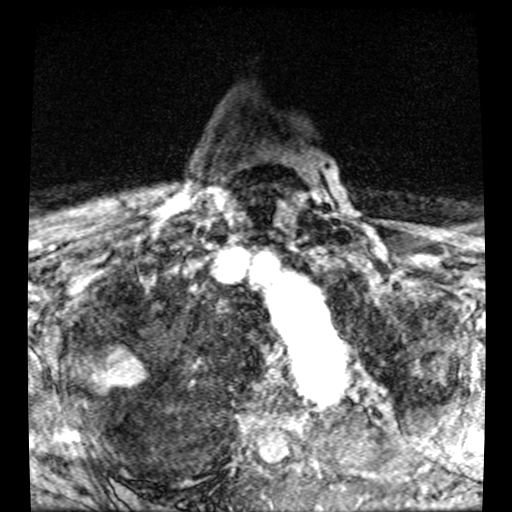

[Series 5: DWI · coronal · 4.0mm · 0.94mm/px · 1 of 72 slices shown (2 of 2)]
[im 1/72]
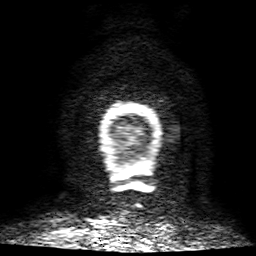

[Series 6: FLAIR · sagittal · 5.0mm · 0.94mm/px · 1 of 26 slices shown (1 of 2)]
[im 1/26]
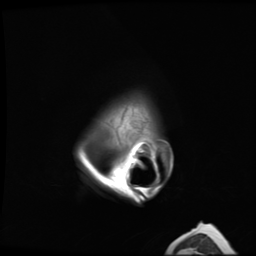

[Series 6: TOF · axial · 2.0mm · 0.86mm/px · 1 of 129 slices shown (3 of 6)]
[im 1/129]
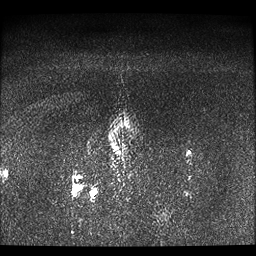

[Series 8: T2 · axial · 5.0mm · 0.94mm/px · 1 of 29 slices shown (1 of 2)]
[im 1/29]
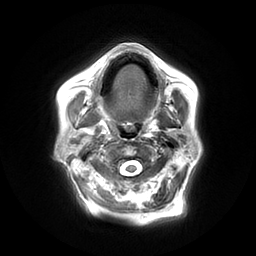

[Series 9: FLAIR · axial · 3.0mm · 0.94mm/px · 1 of 29 slices shown (2 of 2)]
[im 1/29]
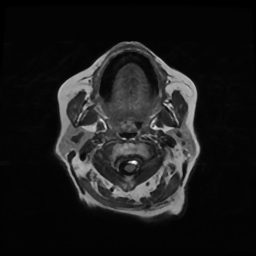

[Series 10: (person_name) · axial · 3.0mm · 0.47mm/px · 1 of 108 slices shown]
[im 1/108]
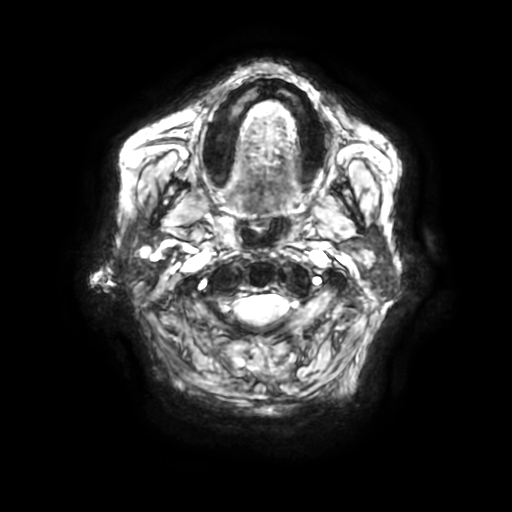

[Series 11: ax 3(person_name) · axial · 3.0mm · 0.94mm/px · 1 of 54 slices shown]
[im 1/54]
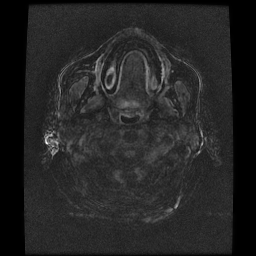

[Series 12: T2 · coronal · 5.0mm · 0.86mm/px · 1 of 30 slices shown (2 of 2)]
[im 1/30]
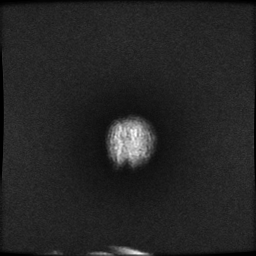

[Series 350: ADC · axial · 3.0mm · 0.94mm/px · 1 of 52 slices shown (1 of 2)]
[im 1/52]
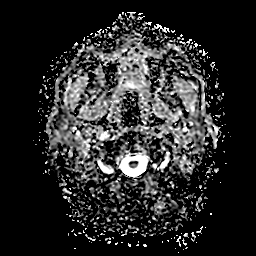

[Series 401: TOF · sagittal · 2.4mm · 0.47mm/px · 1 of 19 slices shown (4 of 6)]
[im 1/19]
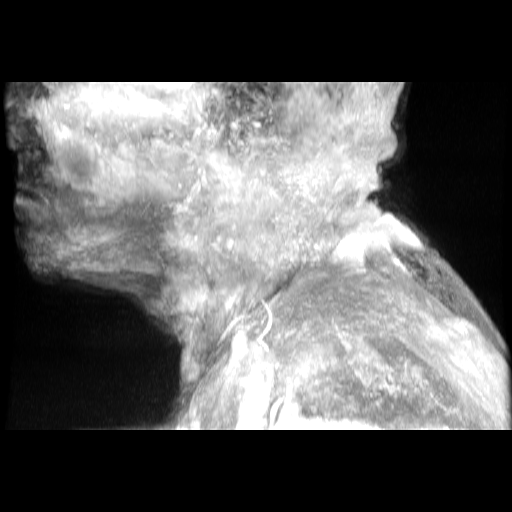

[Series 501: TOF · sagittal · 2.4mm · 0.47mm/px · 1 of 19 slices shown (5 of 6)]
[im 1/19]
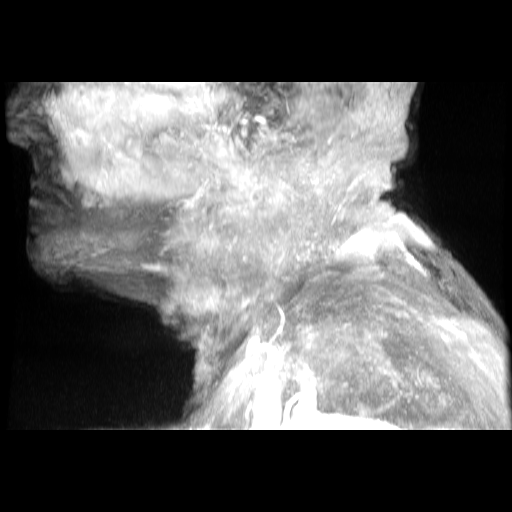

[Series 550: ADC · coronal · 4.0mm · 0.94mm/px · 1 of 36 slices shown (2 of 2)]
[im 1/36]
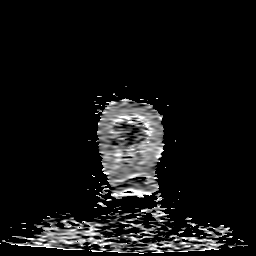

[Series 601: TOF · sagittal · 2.0mm · 0.86mm/px · 1 of 19 slices shown (6 of 6)]
[im 1/19]
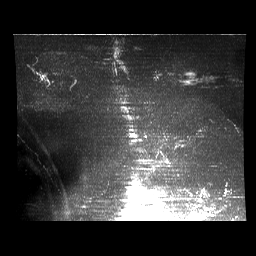

[Series 700: cor cemra ft · coronal · 1.2mm · 0.59mm/px · 1 of 113 slices shown]
[im 1/113]
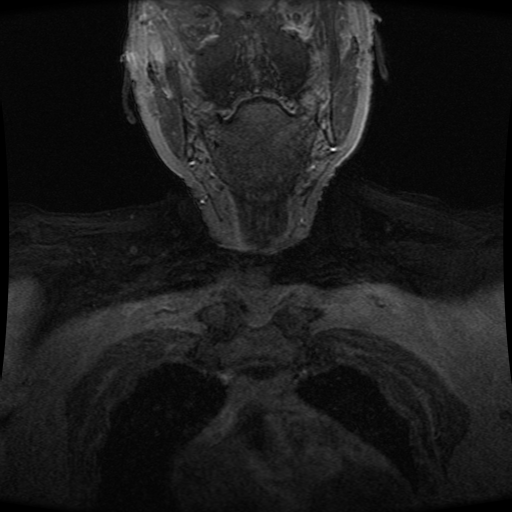

[Series 701: ph1/cor cemra ft · coronal · 1.2mm · 0.59mm/px · 1 of 113 slices shown]
[im 1/113]
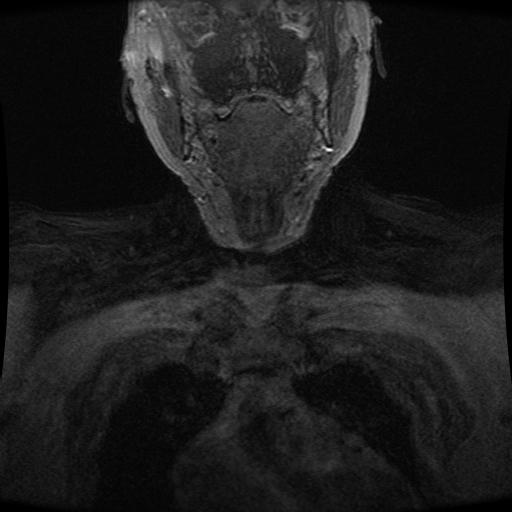

[Series 702: ph2/cor cemra ft · coronal · 1.2mm · 0.59mm/px · 1 of 113 slices shown]
[im 1/113]
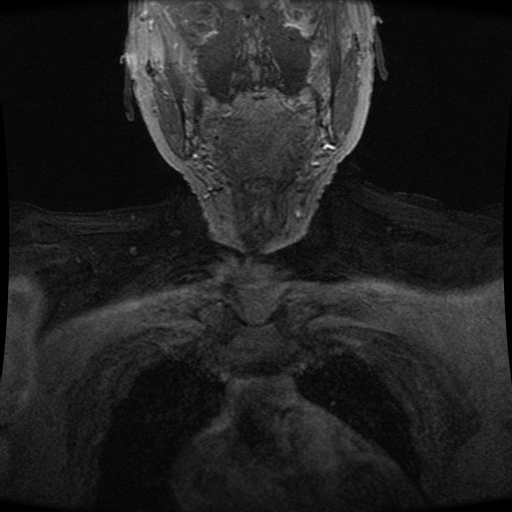

[Series 703: ft: ph1/cor cemra · axial · 20.0mm · 1.01mm/px · 1 of 51 slices shown]
[im 1/51]
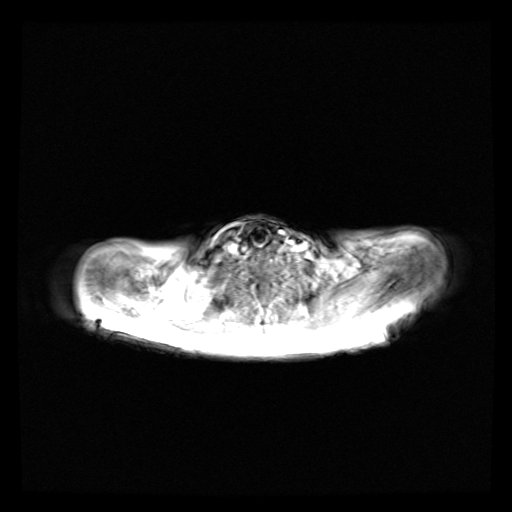

[Series 1000: filt_pha: (person_name) · axial · 3.0mm · 0.47mm/px · 1 of 108 slices shown]
[im 1/108]
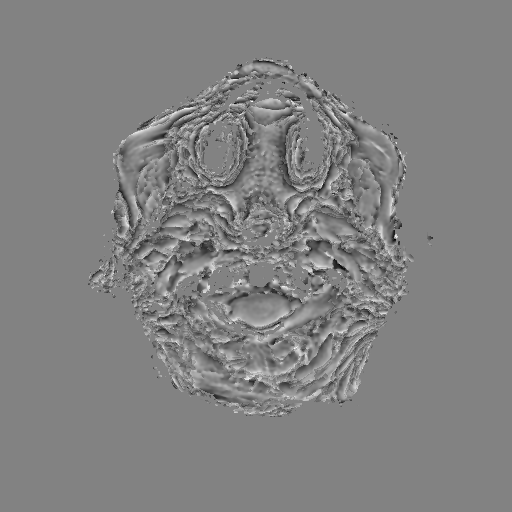

[((id)/701/1)-((id)/700/1) · coronal · 1.2mm · 0.59mm/px · 1 of 113 slices shown]
[im 1/113]
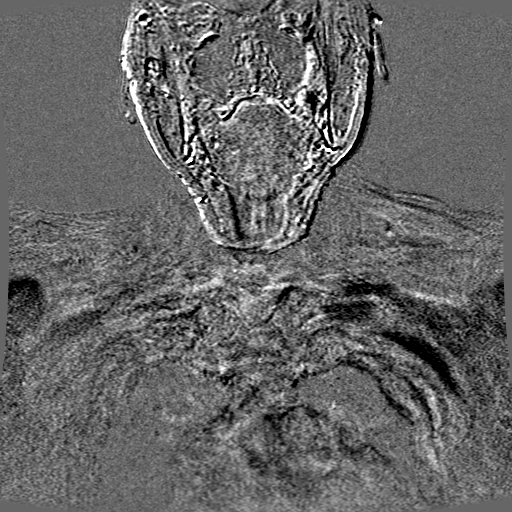

[((id)/702/1)-((id)/700/1) · coronal · 1.2mm · 0.59mm/px · 1 of 113 slices shown]
[im 1/113]
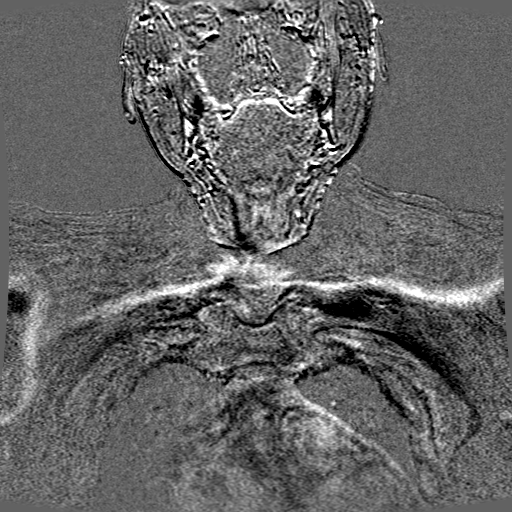

[24 of 24 positions shown; findings below may reference images not displayed]

FINDINGS: MRI HEAD FINDINGS

Brain: There are 2 small foci of restricted diffusion identified.
The larger measuring 8 millimeters is in the left centrum semiovale
on series 3, image 37. There is a smaller cortical area of
restricted diffusion along the posterolateral left lower parietal
lobe on series 3, image 30.

No other restricted diffusion identified. No associated acute
hemorrhage or mass effect.

Underlying severe widespread T2 and FLAIR signal hyperintensity in
the cerebral white matter and throughout the deep gray matter nuclei
stable since [REDACTED]. Comparatively mild for age T2 heterogeneity
in the pons and there are 1 or 2 unchanged tiny chronic lacunar
infarcts in the cerebellum (series 8, image 11). Occasional
scattered chronic microhemorrhages are stable, including those in
both thalami and the central pons

No midline shift, mass effect, evidence of mass lesion,
ventriculomegaly, extra-axial collection or acute intracranial
hemorrhage. Cervicomedullary junction and pituitary are within
normal limits.

Vascular: Major intracranial vascular flow voids are stable since
[REDACTED].

Skull and upper cervical spine: Negative for age visible cervical
spine. Normal bone marrow signal.

Sinuses/Orbits: Stable and negative.

Other: Mastoids remain clear. Visible internal auditory structures
appear normal. Scalp and face soft tissues appear negative.

MRA HEAD FINDINGS

Antegrade flow in the distal vertebral arteries. The right vertebral
appears to functionally terminates in PICA. The left PICA is patent.
The distal left vertebral supplies the basilar without definite
stenosis. The basilar artery is diminutive on the basis of fetal
type bilateral PCA origins. Both SCA origins are patent. Bilateral
posterior communicating arteries and PCA branches are patent with no
proximal stenosis.

Antegrade flow in both ICA siphons. Bilateral siphon irregularity
without stenosis. Normal ophthalmic and posterior communicating
artery origins. Patent carotid termini. Normal MCA and ACA origins.
The left A1 is dominant and the right is diminutive. The anterior
communicating artery and visible ACA branches are within normal
limits. MCA M1 segments and MCA bi/trifurcations are patent. No
proximal MCA branch stenosis.
IMPRESSION: 1. There are two small acute infarcts in the left centrum semiovale
and left parietal lobe cortex with no hemorrhage or mass effect.
2. No other acute intracranial abnormality. Underlying advanced
signal changes in the brain compatible with chronic small vessel
disease.
3. Intracranial MRA is negative for large vessel occlusion or
proximal branch stenosis.
4. Neck MRA was attempted with 7 mL IV Gadavist but for unclear
technical reasons was non-diagnostic and is not being charged.

## 2020-08-21 IMAGING — DX DG CHEST 1V PORT
1 series · 1 of 1 positions shown · non-contrast
Comparison: Chest x-ray of April 13, 2018

CLINICAL DATA: CVA yesterday. Increased confusion. History of
atrial fibrillation, aortic stenosis, diabetes, multiple scleroses,
nonsmoker.

EXAM:
PORTABLE CHEST 1 VIEW

[chest ap]
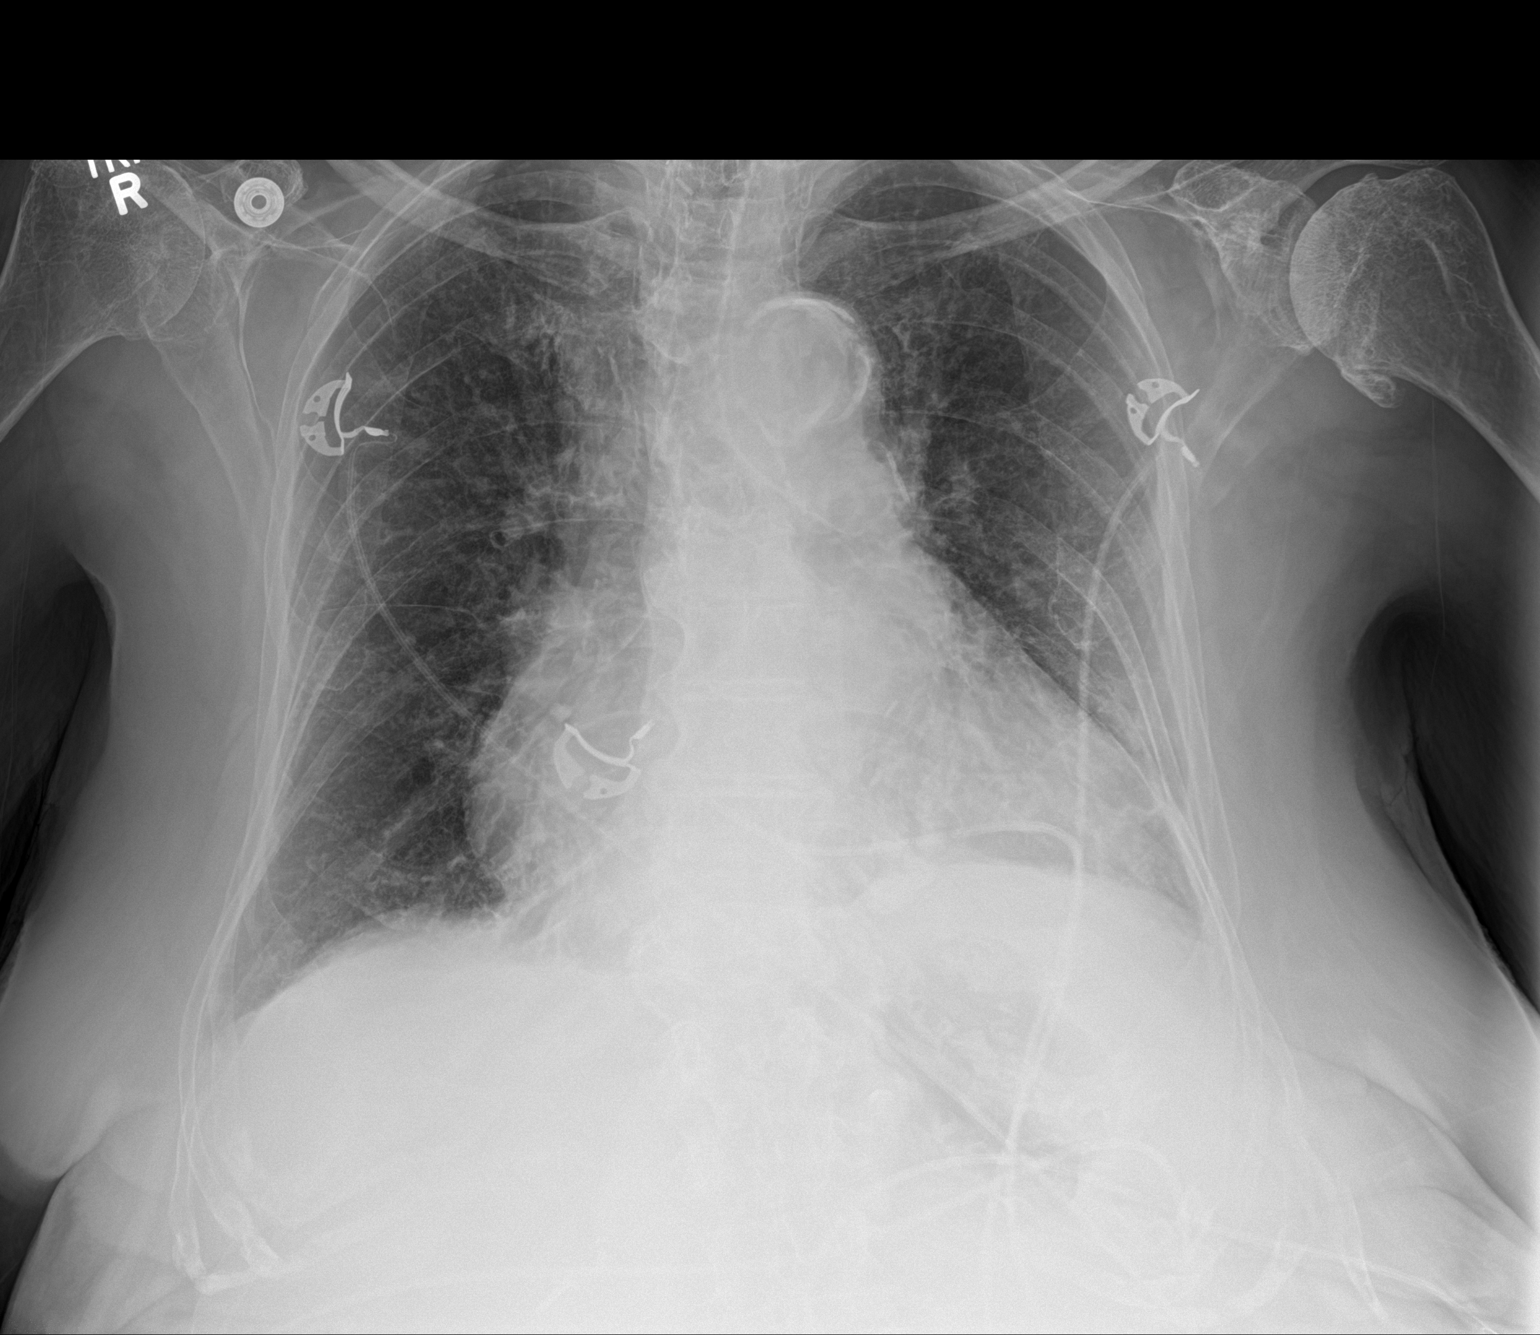

[1 of 1 positions shown; findings below may reference images not displayed]

FINDINGS: The lungs are adequately inflated. There are increased lung markings
in the retrocardiac region on the left. Elsewhere the interstitial
markings are coarse but stable. The cardiac silhouette is enlarged.
The pulmonary vascularity is not clearly engorged. There is
calcification in the wall of the thoracic aorta. The bony thorax
exhibits no acute abnormality. There is moderate degenerative change
of the left shoulder with milder changes of the right shoulder.
IMPRESSION: Chronic bronchitic changes. I cannot exclude atelectatic changes in
the left lower lobe. No definite pneumonia. Stable cardiomegaly
without pulmonary edema.

Thoracic aortic atherosclerosis.

## 2020-08-21 IMAGING — CT CT HEAD W/O CM
4 series · 16 of 47 positions shown, 18 images · non-contrast
Comparison: Brain MRI from yesterday.  Head CT yesterday.

CLINICAL DATA: Neuro change with speech difficulty

EXAM:
CT HEAD WITHOUT CONTRAST
TECHNIQUE: Contiguous axial images were obtained from the base of the skull
through the vertex without intravenous contrast.

[Series 3: head without · axial · non-contrast · 0.45mm/px · z∈[-118,+2]mm · 7 of 33 slices shown, 9 images]
[im 5/33  brain]
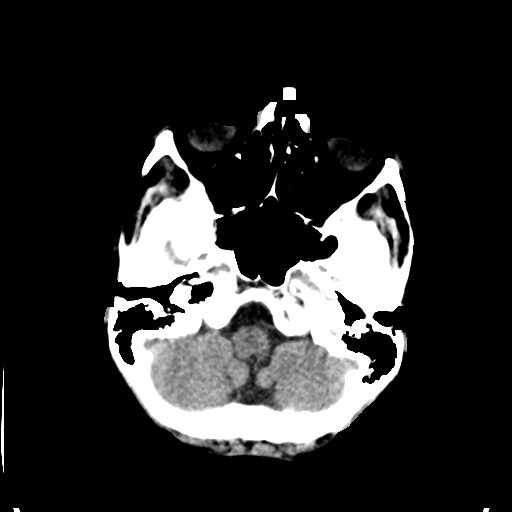
[im 5/33  bone]
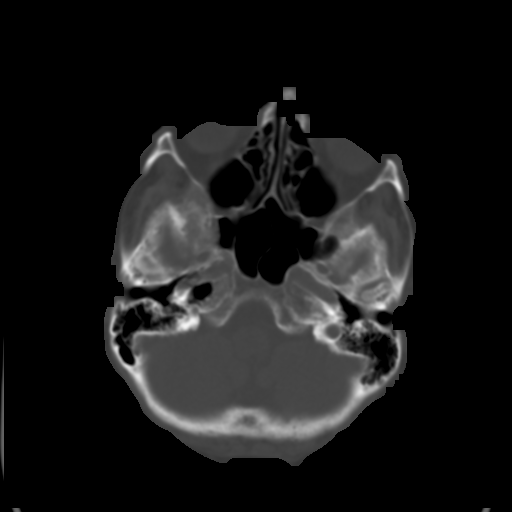
[im 9/33  brain]
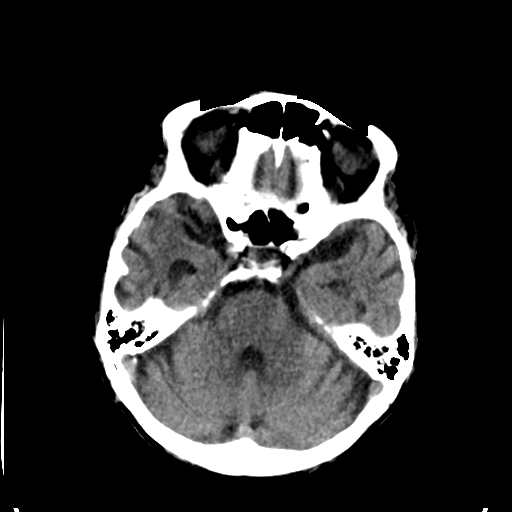
[im 13/33  brain]
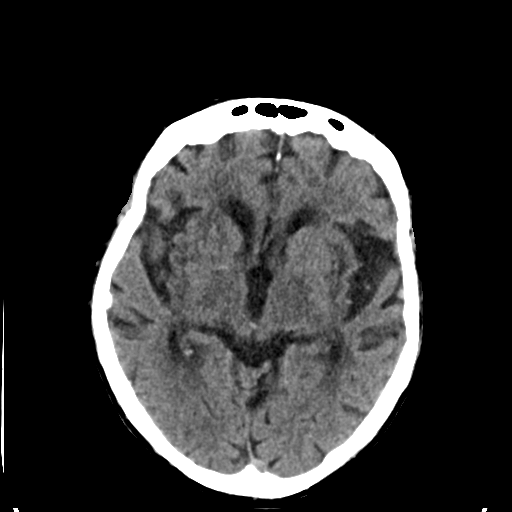
[im 17/33  brain]
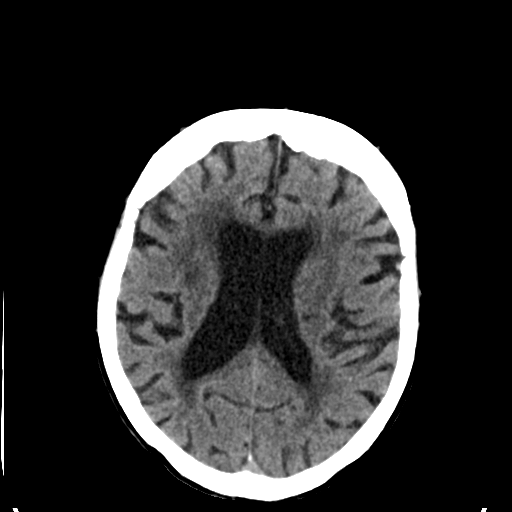
[im 21/33  brain]
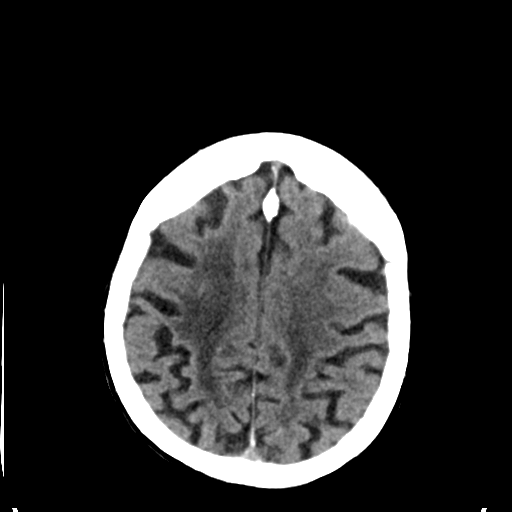
[im 21/33  bone]
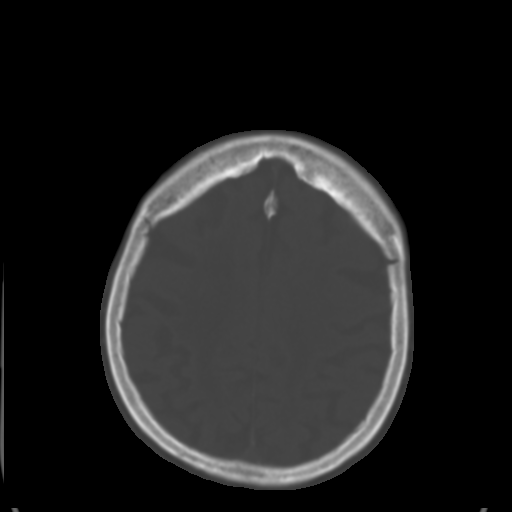
[im 25/33  brain]
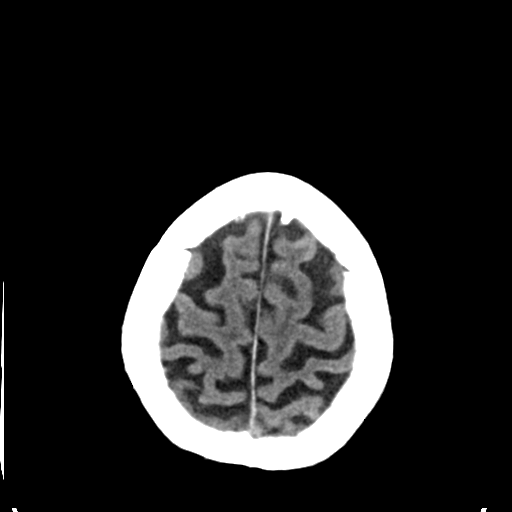
[im 29/33  brain]
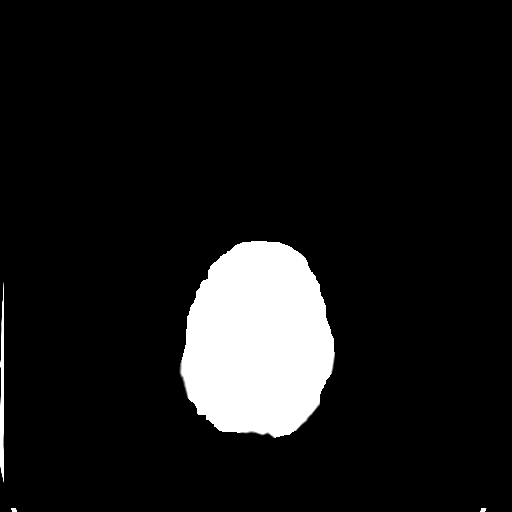

[Series 4: head bone · axial · 0.48mm/px · z∈[-122,-90]mm · 3 of 81 slices shown]
[im 9/81  bone]
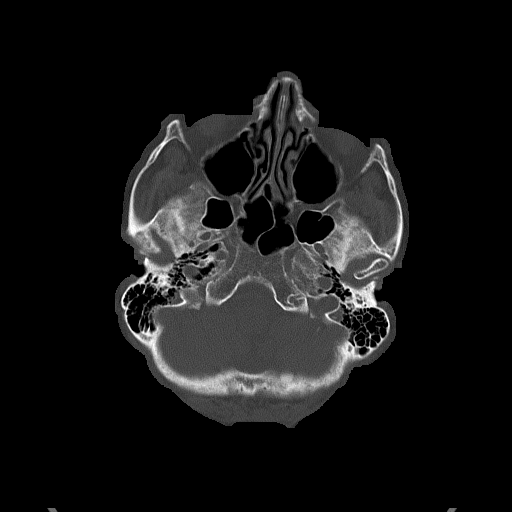
[im 17/81  bone]
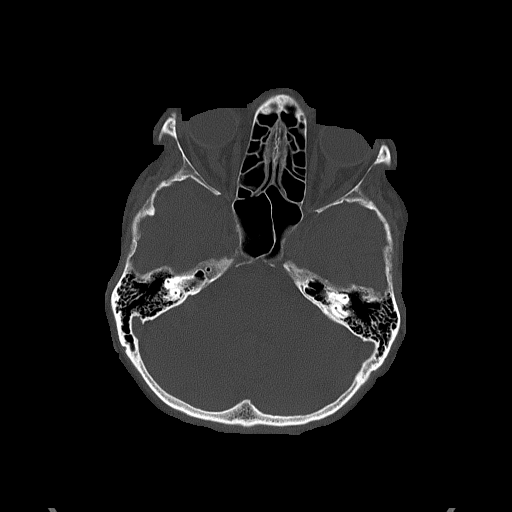
[im 25/81  bone]
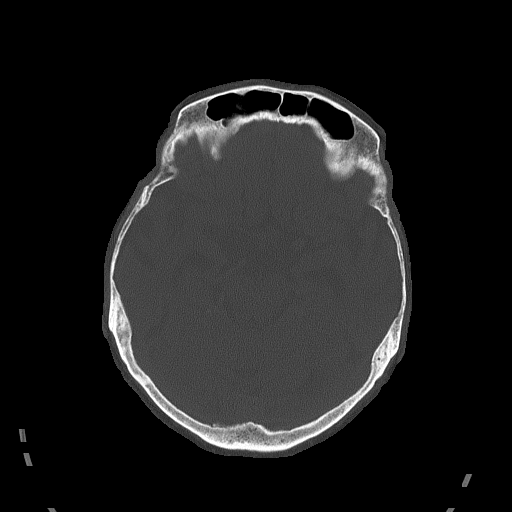

[Series 5: head without cor · coronal · non-contrast · 0.33mm/px · 3 of 67 slices shown]
[im 23/67  brain]
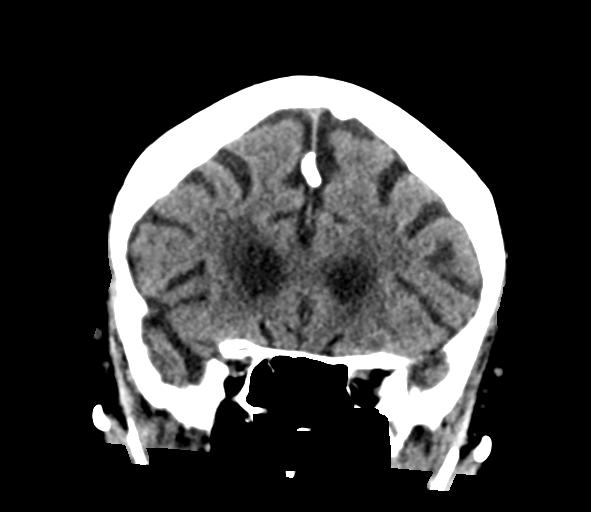
[im 30/67  brain]
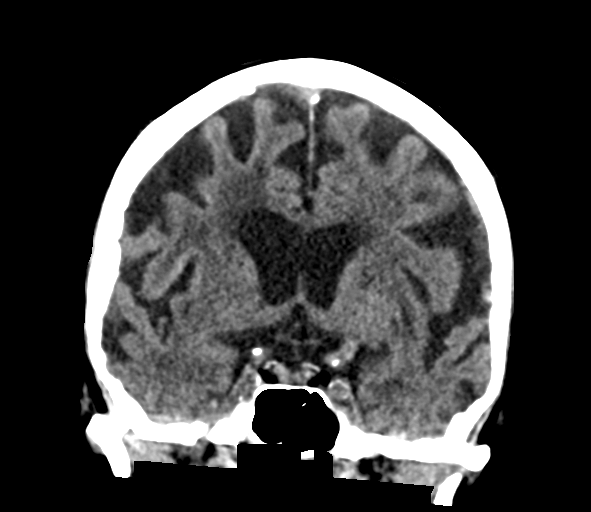
[im 37/67  brain]
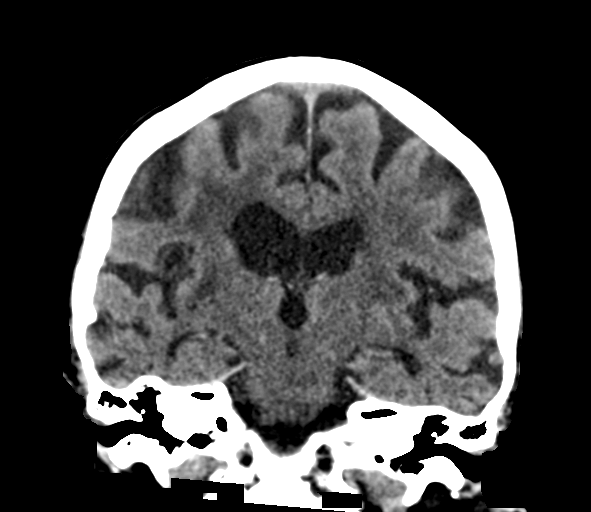

[Series 6: head without sag · sagittal · non-contrast · 0.36mm/px · 3 of 60 slices shown]
[im 20/60  brain]
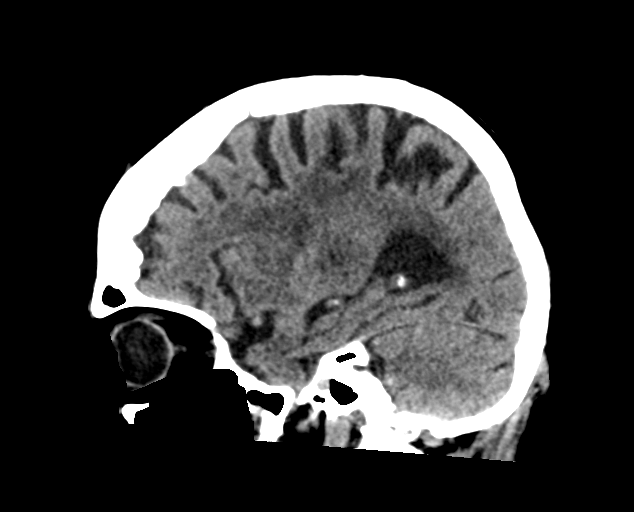
[im 30/60  brain]
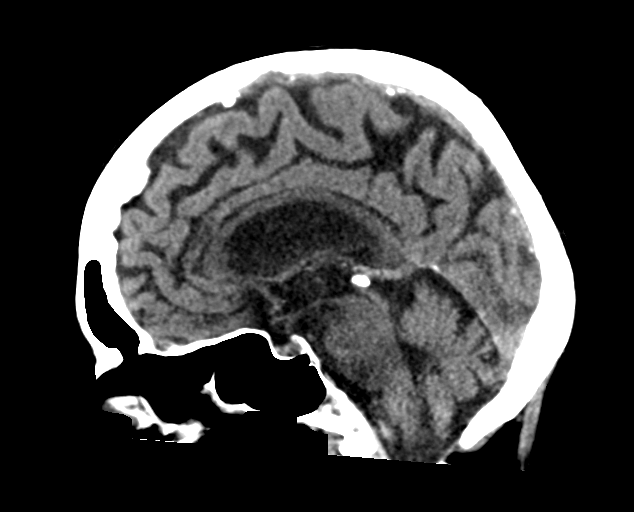
[im 40/60  brain]
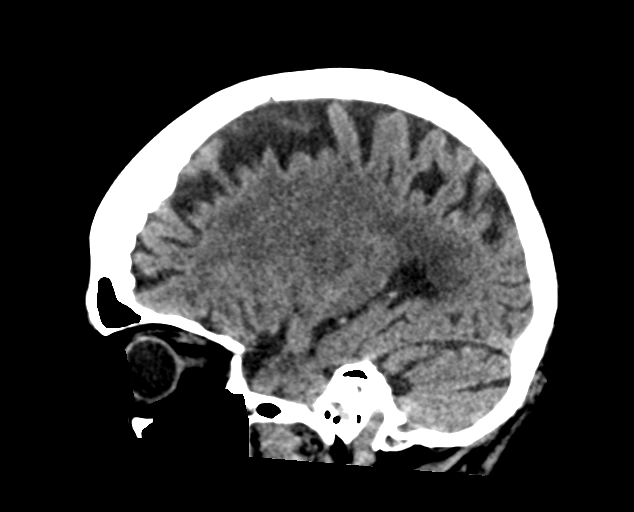

[16 of 47 positions shown; findings below may reference images not displayed]

FINDINGS: Brain: 2 known small acute infarcts by MRI are not detected on this
CT. There is generalized atrophy and chronic small vessel ischemia
with confluent gliosis in the cerebral white matter. No hemorrhage,
hydrocephalus, or collection.

Vascular: Atherosclerotic calcification.  No hyperdense vessel

Skull: Negative

Sinuses/Orbits: Bilateral cataract resection
IMPRESSION: No acute finding or change from yesterday.

## 2021-12-07 IMAGING — CT CT HEAD W/O CM
4 series · 15 of 47 positions shown, 17 images · non-contrast
Comparison: None.

CLINICAL DATA: Status post trauma.

EXAM:
CT HEAD WITHOUT CONTRAST
TECHNIQUE: Contiguous axial images were obtained from the base of the skull
through the vertex without intravenous contrast.

[Series 1: head without · axial · non-contrast · 0.44mm/px · z∈[-148,-23]mm · 7 of 35 slices shown, 9 images]
[im 5/35  brain]
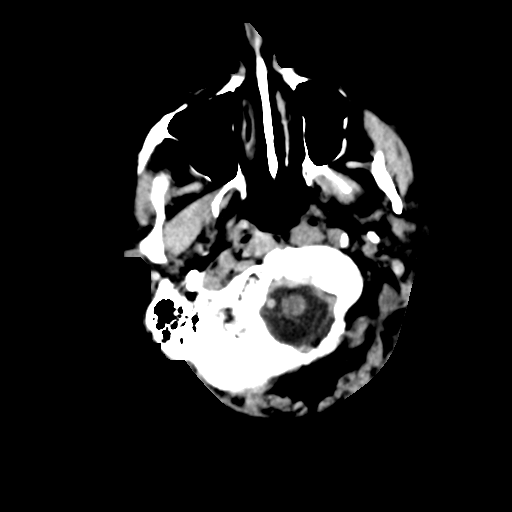
[im 5/35  bone]
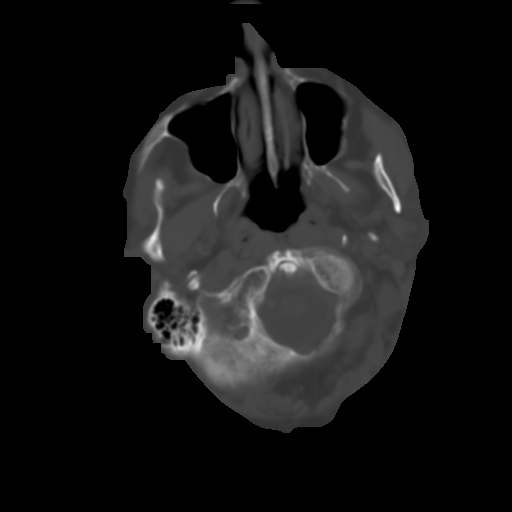
[im 9/35  brain]
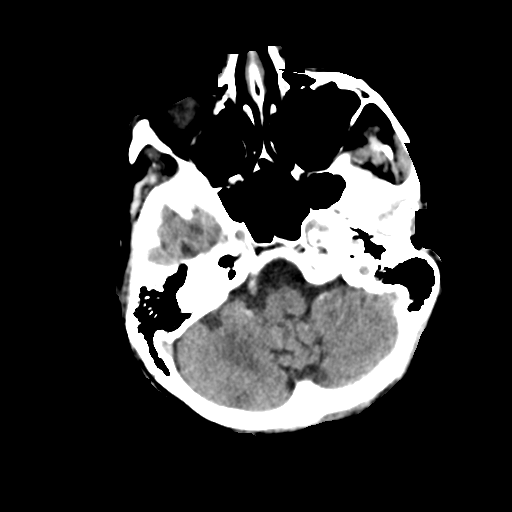
[im 13/35  brain]
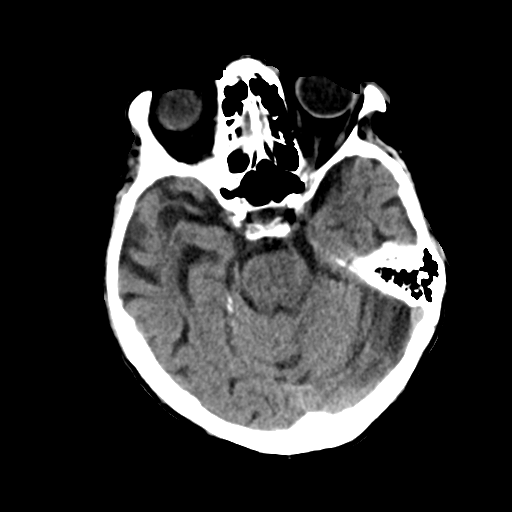
[im 18/35  brain]
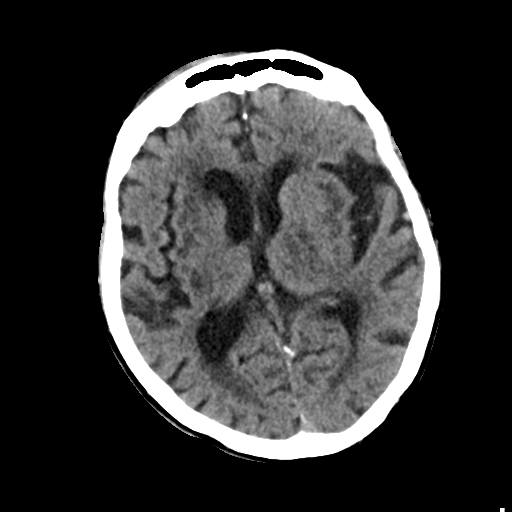
[im 22/35  brain]
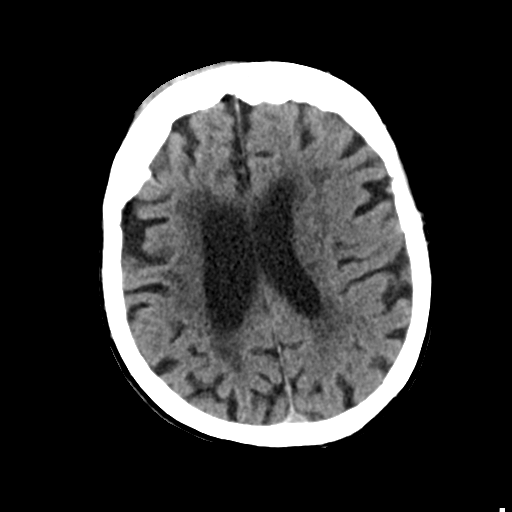
[im 22/35  bone]
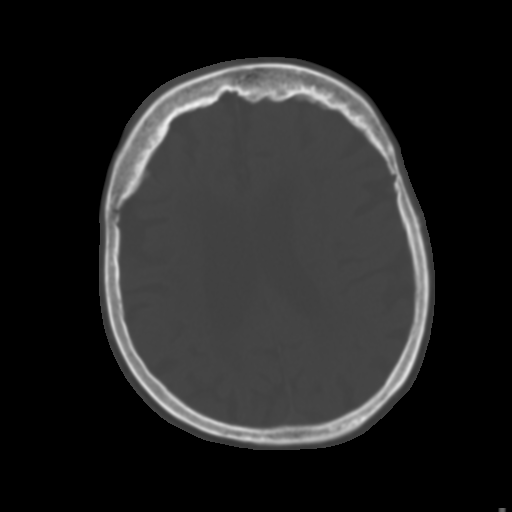
[im 26/35  brain]
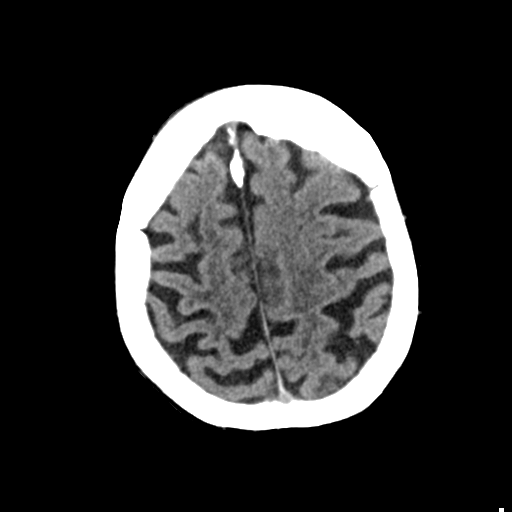
[im 30/35  brain]
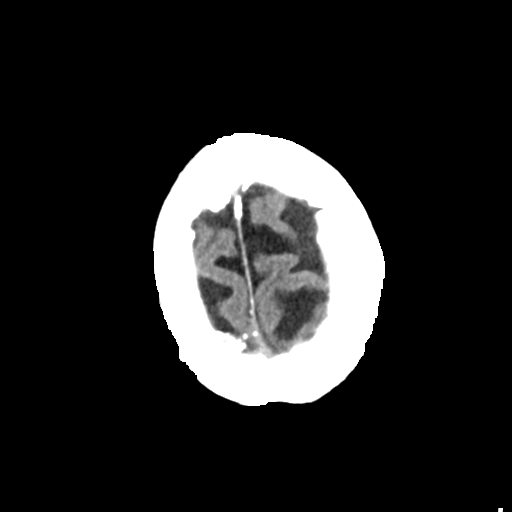

[Series 4: head bone · axial · 0.44mm/px · z∈[-152,-134]mm · 2 of 86 slices shown]
[im 9/86  bone]
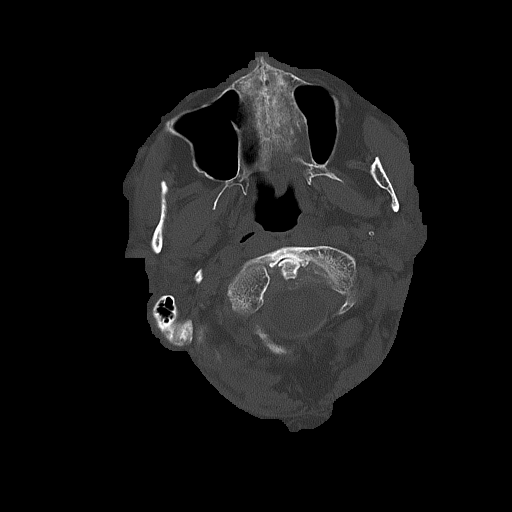
[im 18/86  bone]
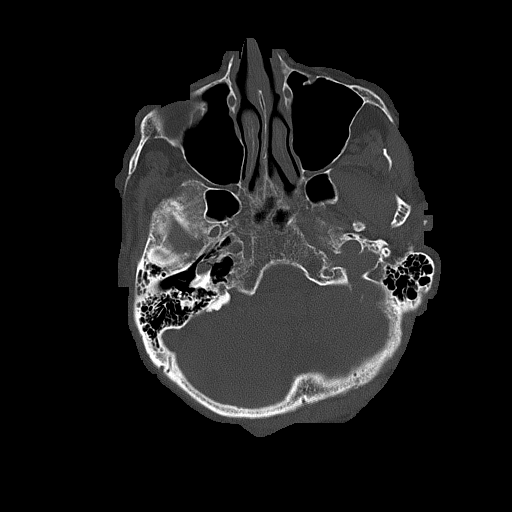

[Series 5: head without cor · coronal · non-contrast · 0.33mm/px · 3 of 78 slices shown]
[im 26/78  brain]
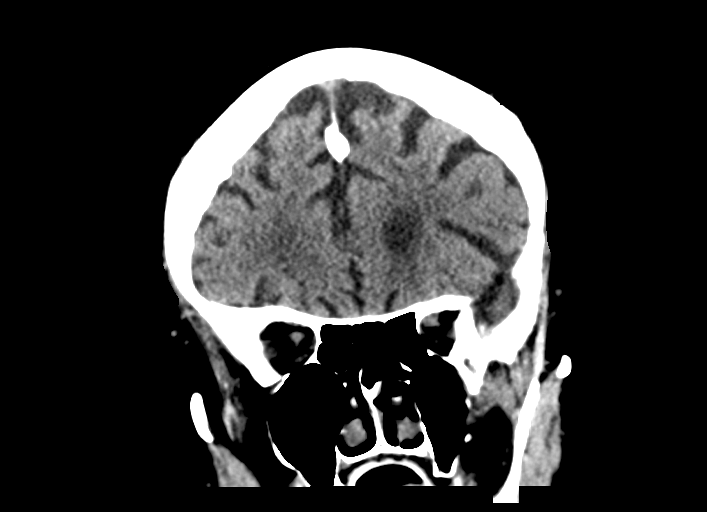
[im 35/78  brain]
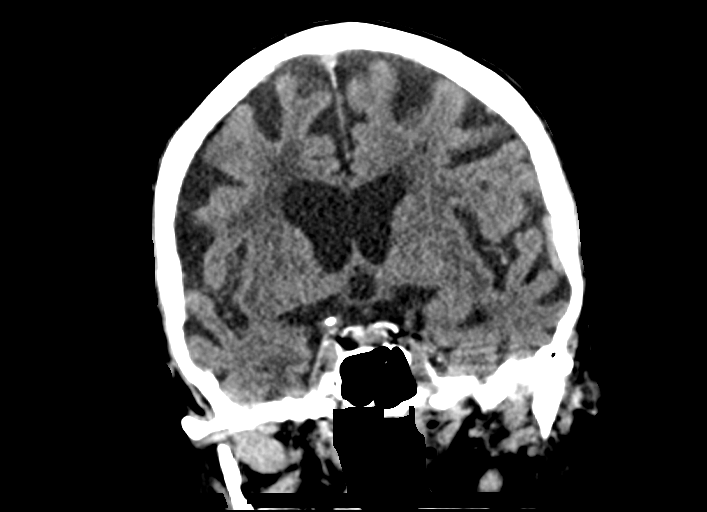
[im 43/78  brain]
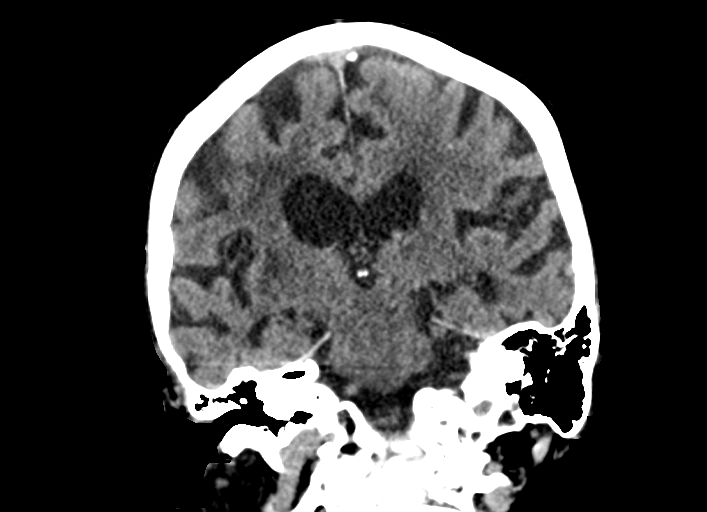

[Series 6: head without sag · sagittal · non-contrast · 0.38mm/px · 3 of 67 slices shown]
[im 23/67  brain]
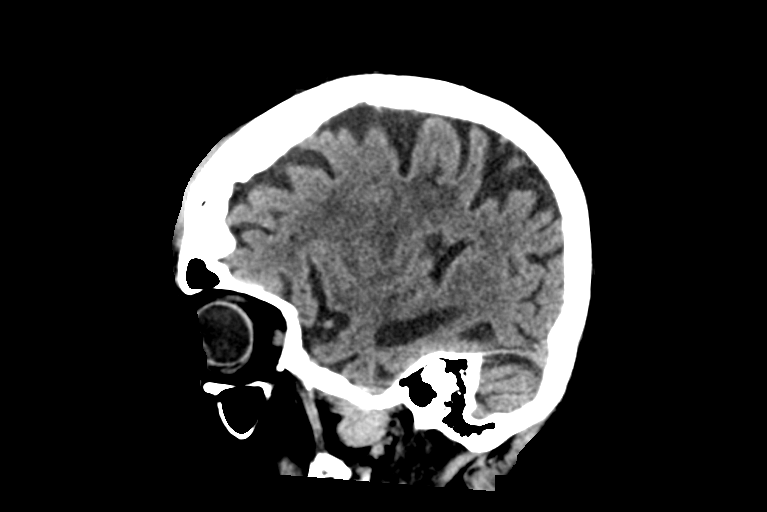
[im 34/67  brain]
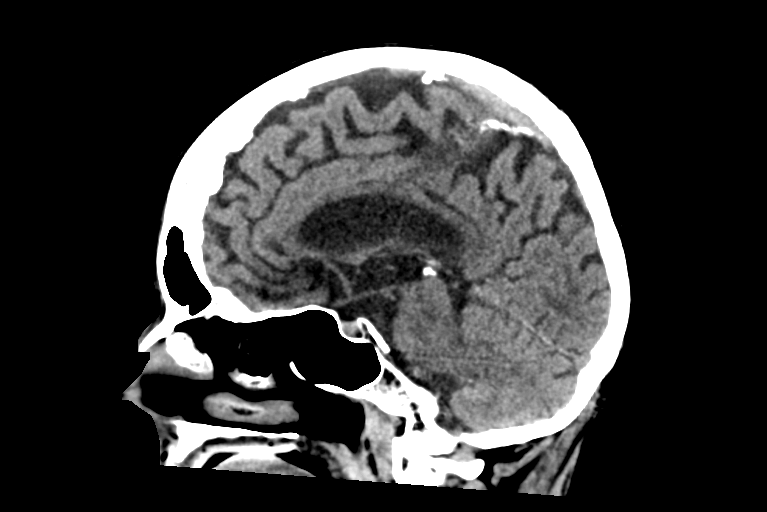
[im 45/67  brain]
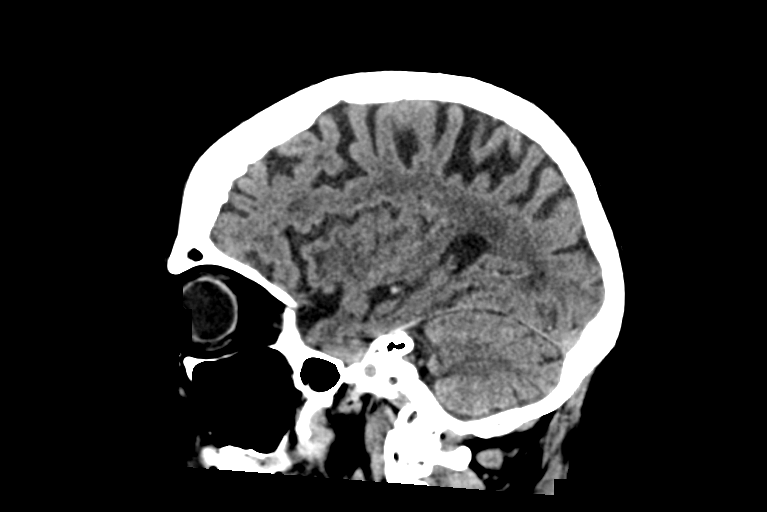

[15 of 47 positions shown; findings below may reference images not displayed]

FINDINGS: Brain: There is moderate severity cerebral atrophy with widening of
the extra-axial spaces and ventricular dilatation.
There are areas of decreased attenuation within the white matter
tracts of the supratentorial brain, consistent with microvascular
disease changes.

Vascular: No hyperdense vessel or unexpected calcification.

Skull: Normal. Negative for fracture or focal lesion.

Sinuses/Orbits: No acute finding.

Other: Mild right frontal scalp soft tissue swelling is seen.
IMPRESSION: 1. Mild right frontal scalp soft tissue swelling, without evidence
of acute fracture or acute intracranial abnormality.
2. Generalized cerebral atrophy.

## 2021-12-07 IMAGING — DX DG ELBOW 2V*R*
2 series · 2 of 2 positions shown · non-contrast
Comparison: None.

CLINICAL DATA: Fall.

EXAM:
RIGHT ELBOW - 2 VIEW

[elbow ap]
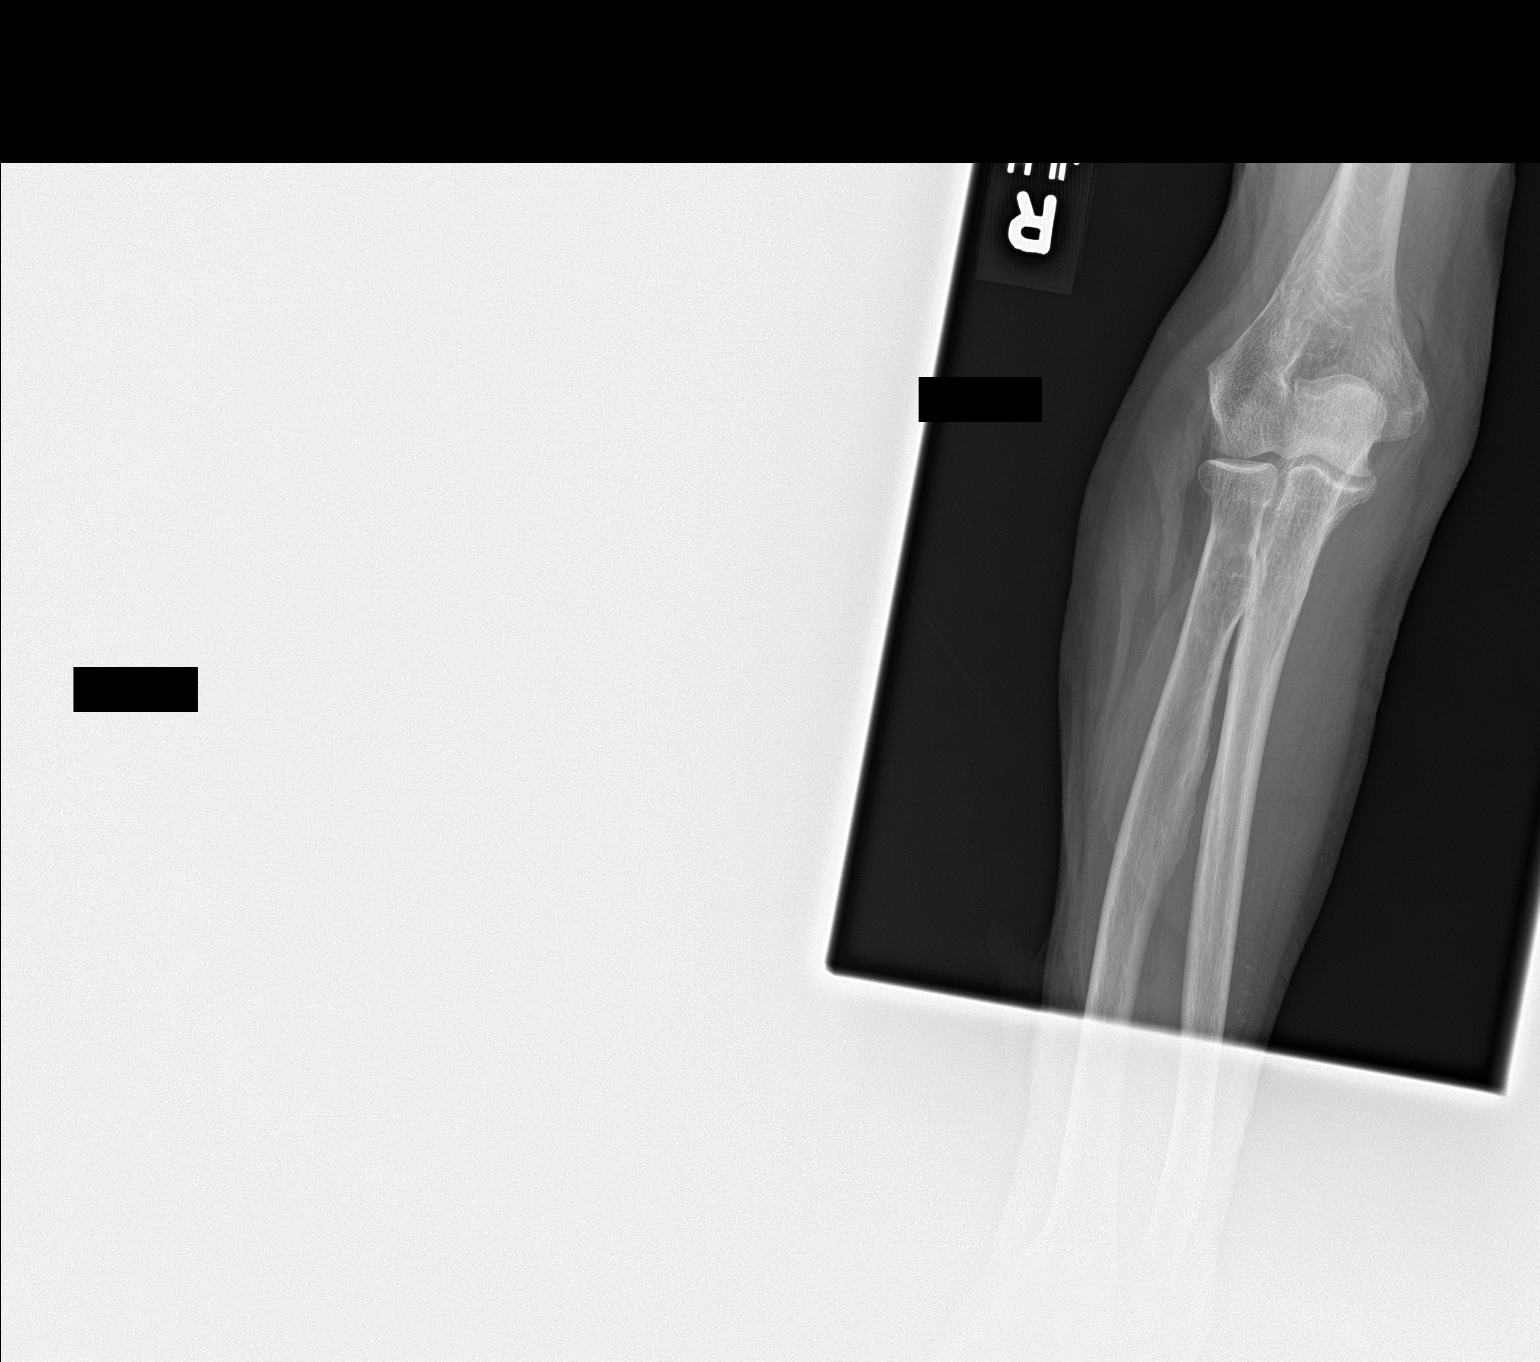

[elbow lat]
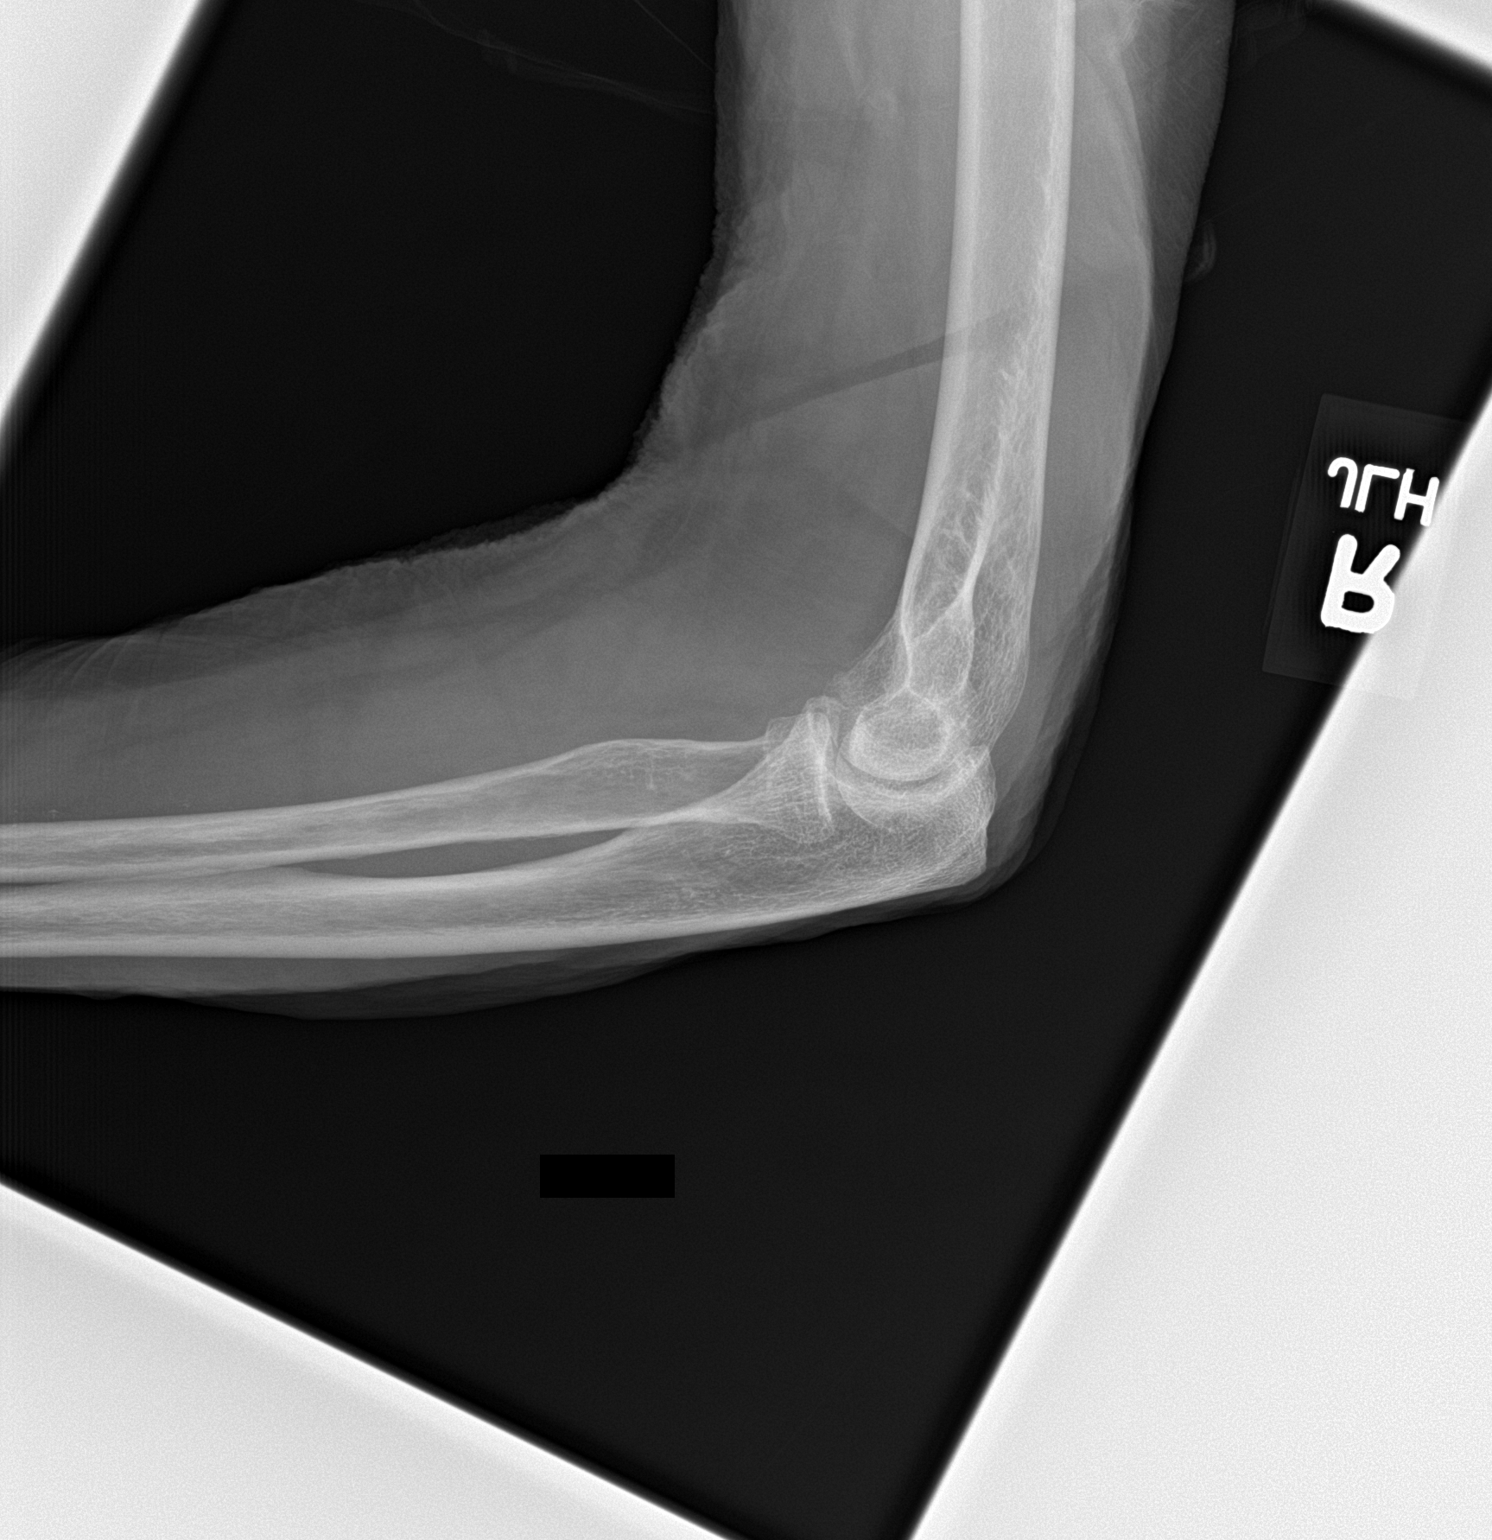

[2 of 2 positions shown; findings below may reference images not displayed]

FINDINGS: No acute fracture or dislocation or radiopaque foreign body. Mild
bone demineralization with a periarticular predisposition. Right
calcified epicondylitis. No significant arthropathy or elbow joint
effusion.
IMPRESSION: No acute bony injury of the right elbow.

## 2021-12-07 IMAGING — DX DG CHEST 1V PORT
1 series · 1 of 1 positions shown · non-contrast
Comparison: Radiograph 07/04/2018

CLINICAL DATA: [AGE] post fall. Right shoulder pain.

EXAM:
PORTABLE CHEST 1 VIEW

[chest]
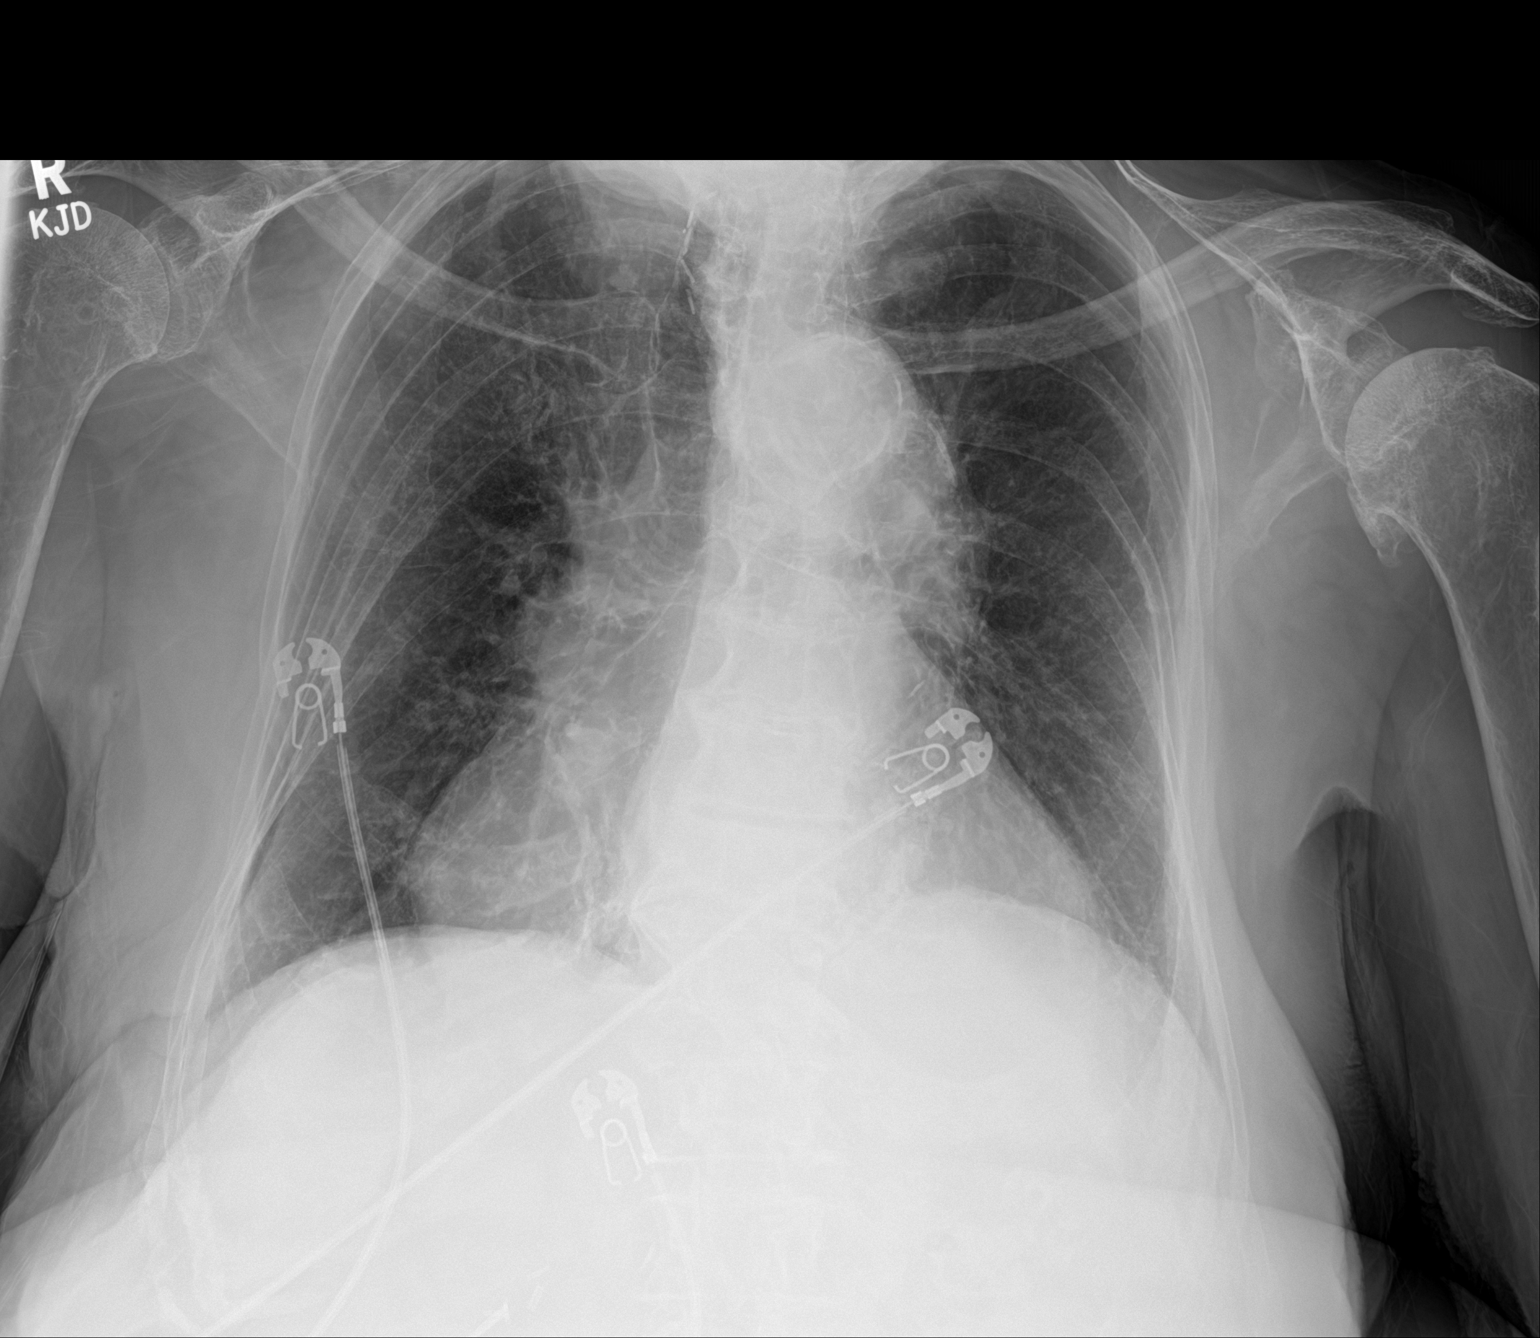

[1 of 1 positions shown; findings below may reference images not displayed]

FINDINGS: Stable cardiomegaly. Unchanged mediastinal contours with aortic
atherosclerosis and tortuosity. No pneumothorax or focal airspace
disease. Chronic interstitial coarsening. No large pleural effusion.
No grossly displaced rib fracture or evidence of acute osseous
abnormality. Chronic degenerative change of the left shoulder.
IMPRESSION: Stable cardiomegaly and chronic interstitial coarsening. No acute
findings are evidence of acute traumatic injury.

Aortic Atherosclerosis ([id number]-MY0.0).

## 2021-12-07 IMAGING — CT CT MAXILLOFACIAL W/O CM
3 series · 14 of 47 positions shown, 16 images · non-contrast
Comparison: None.

CLINICAL DATA: Status post fall.

EXAM:
CT MAXILLOFACIAL WITHOUT CONTRAST
TECHNIQUE: Multidetector CT imaging of the maxillofacial structures was
performed. Multiplanar CT image reconstructions were also generated.

[Series 4: facialbone 2.0 st · axial · 0.38mm/px · z∈[-229,-79]mm · 8 of 87 slices shown, 10 images]
[im 6/87  brain]
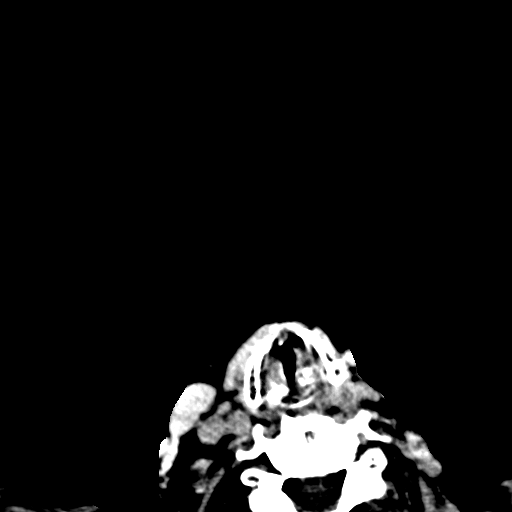
[im 6/87  bone]
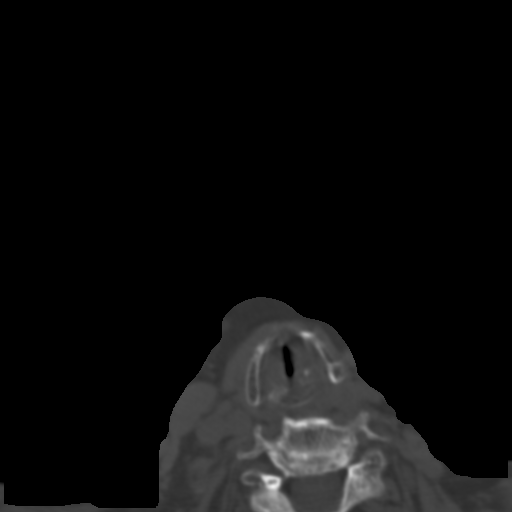
[im 18/87  bone]
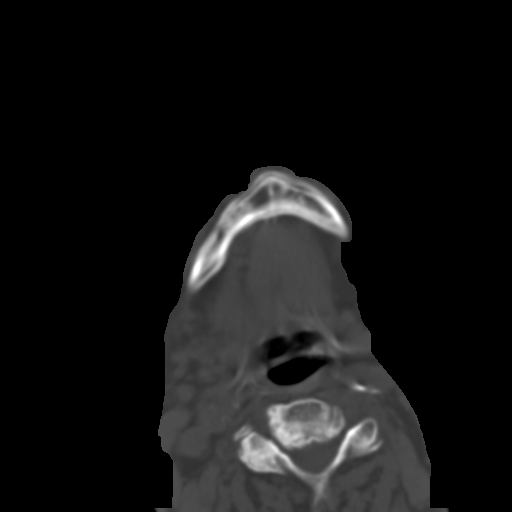
[im 27/87  bone]
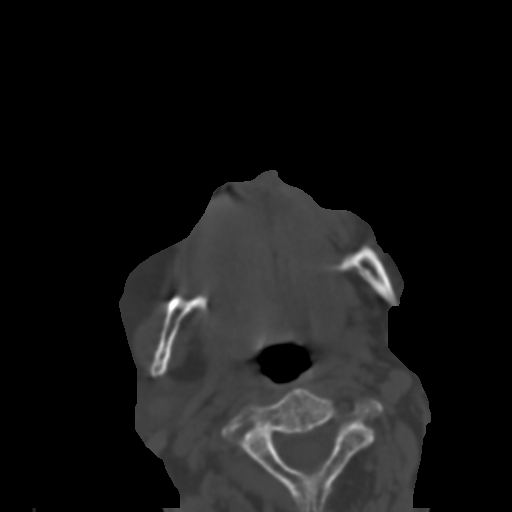
[im 39/87  bone]
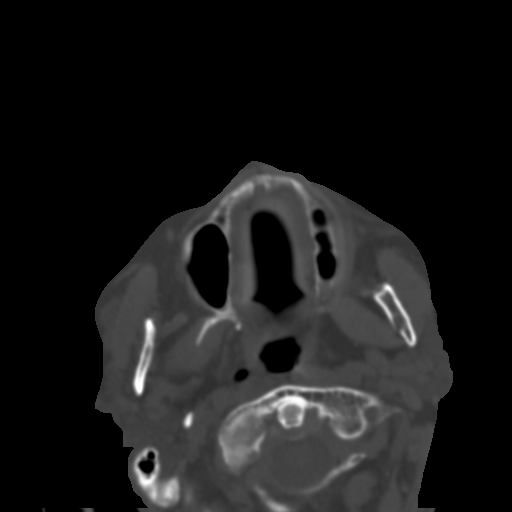
[im 48/87  brain]
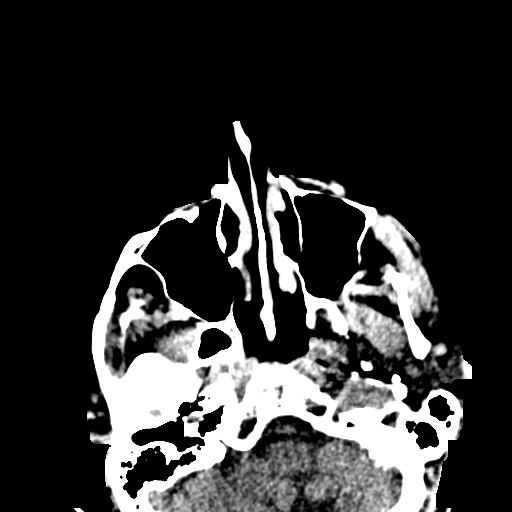
[im 48/87  bone]
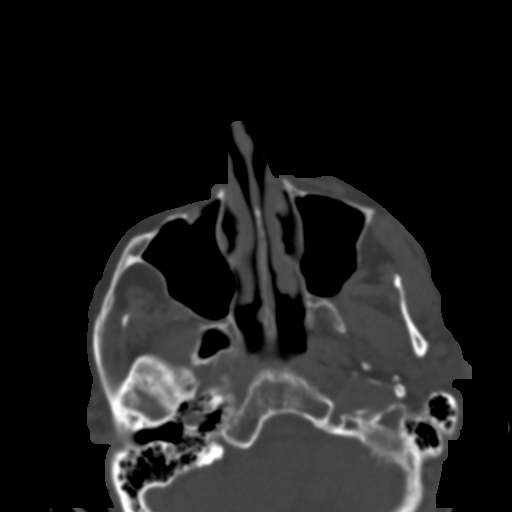
[im 60/87  bone]
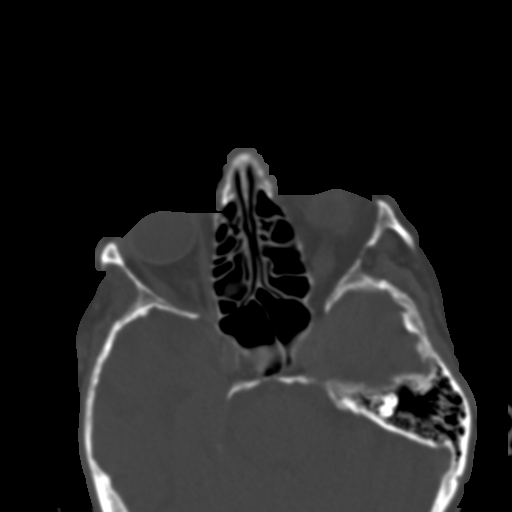
[im 69/87  bone]
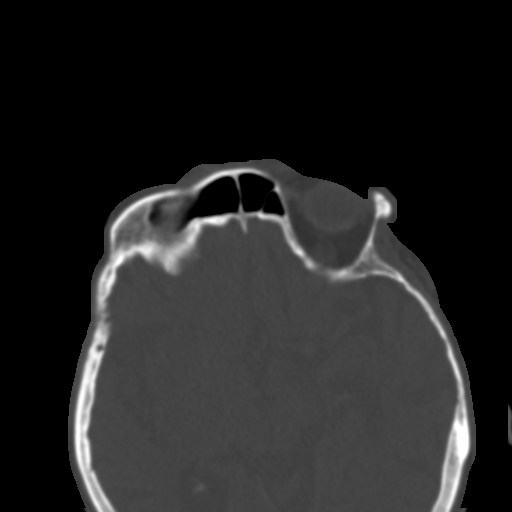
[im 81/87  bone]
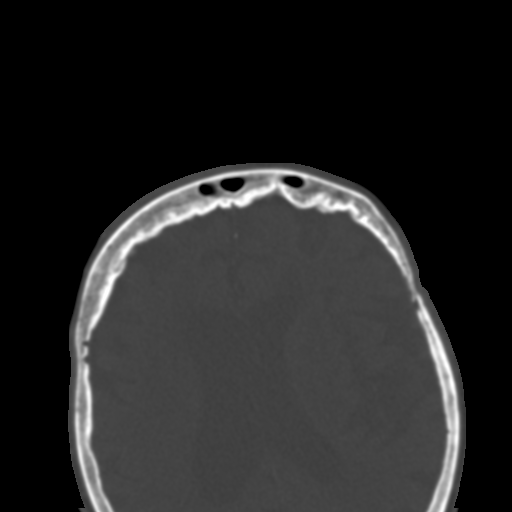

[Series 8: facialbone 2.0 cor st · coronal · 0.34mm/px · 3 of 91 slices shown]
[im 31/91  bone]
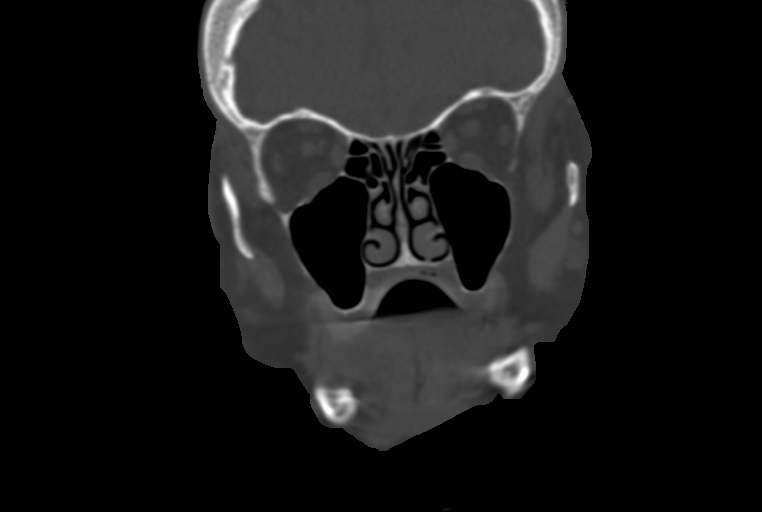
[im 41/91  bone]
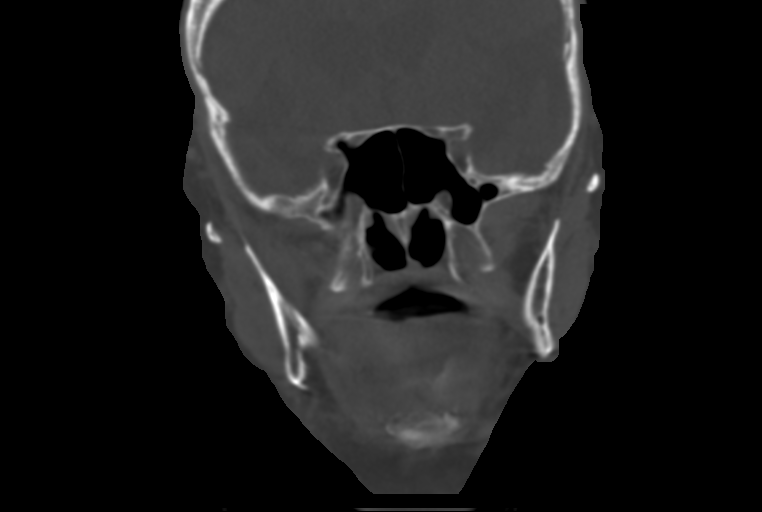
[im 51/91  bone]
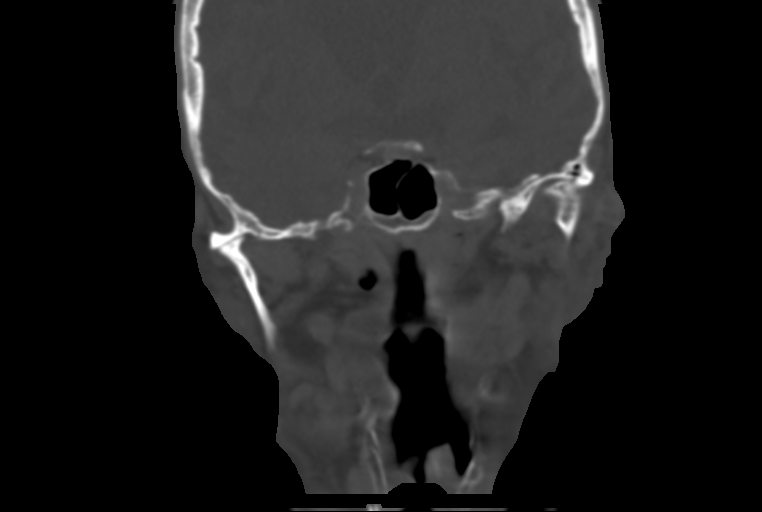

[Series 9: facialbone 2.0 sag st · sagittal · 0.34mm/px · 3 of 98 slices shown]
[im 33/98  bone]
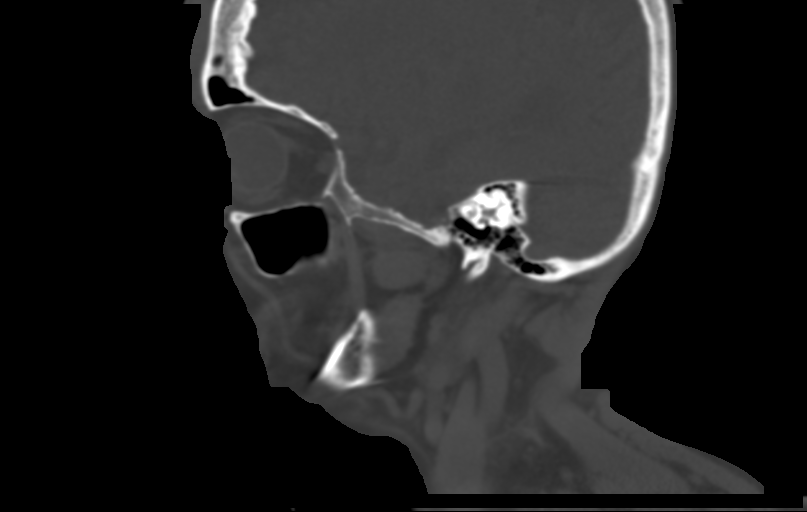
[im 49/98  bone]
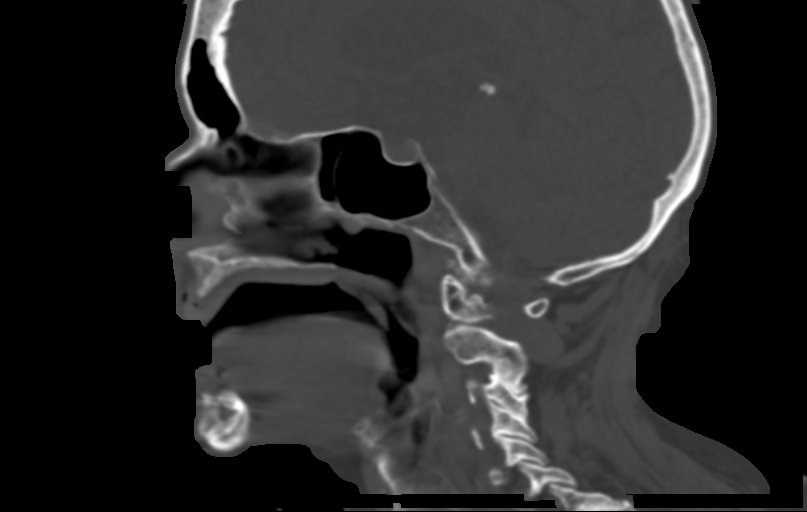
[im 65/98  bone]
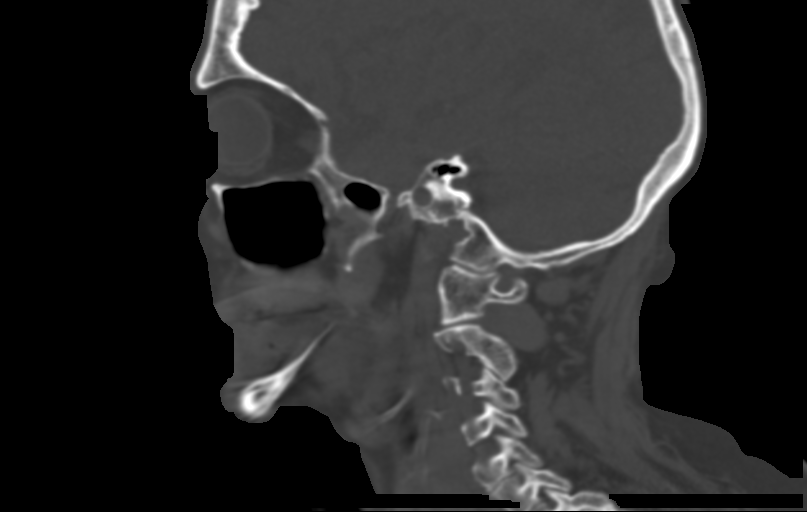

[14 of 47 positions shown; findings below may reference images not displayed]

FINDINGS: Osseous: No fracture or mandibular dislocation. No destructive
process.

Orbits: Negative. No traumatic or inflammatory finding.

Sinuses: Clear.

Soft tissues: There is mild right frontal scalp soft tissue
swelling.

Limited intracranial: No significant or unexpected finding.
IMPRESSION: 1. No evidence for acute fracture.
2. Mild right frontal scalp soft tissue swelling.

## 2021-12-07 IMAGING — DX DG PORTABLE PELVIS
1 series · 2 of 2 positions shown · non-contrast
Comparison: Pelvis and right hip radiograph 04/13/2018

CLINICAL DATA: [AGE] post fall.

EXAM:
PORTABLE PELVIS 1-2 VIEWS

[Series 1: pelvis · 0.14mm/px · 2 of 2 slices shown]
[im 1/2]
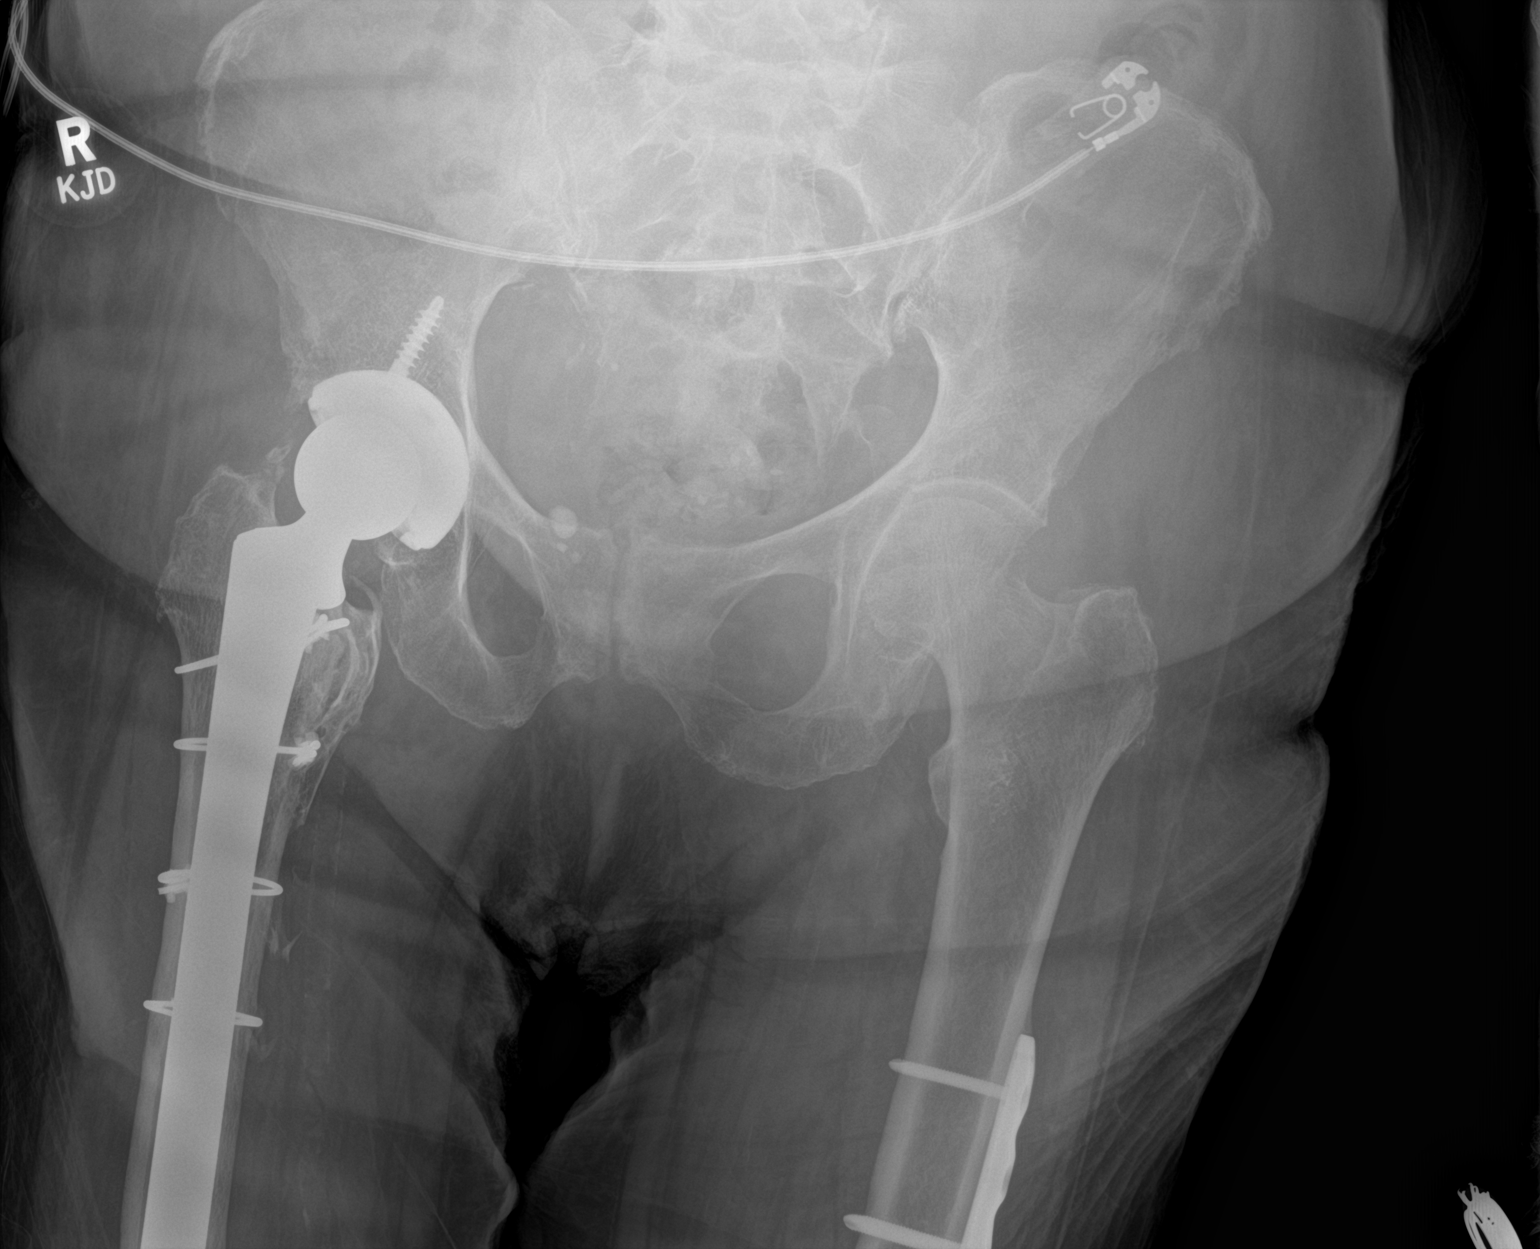
[im 2/2]
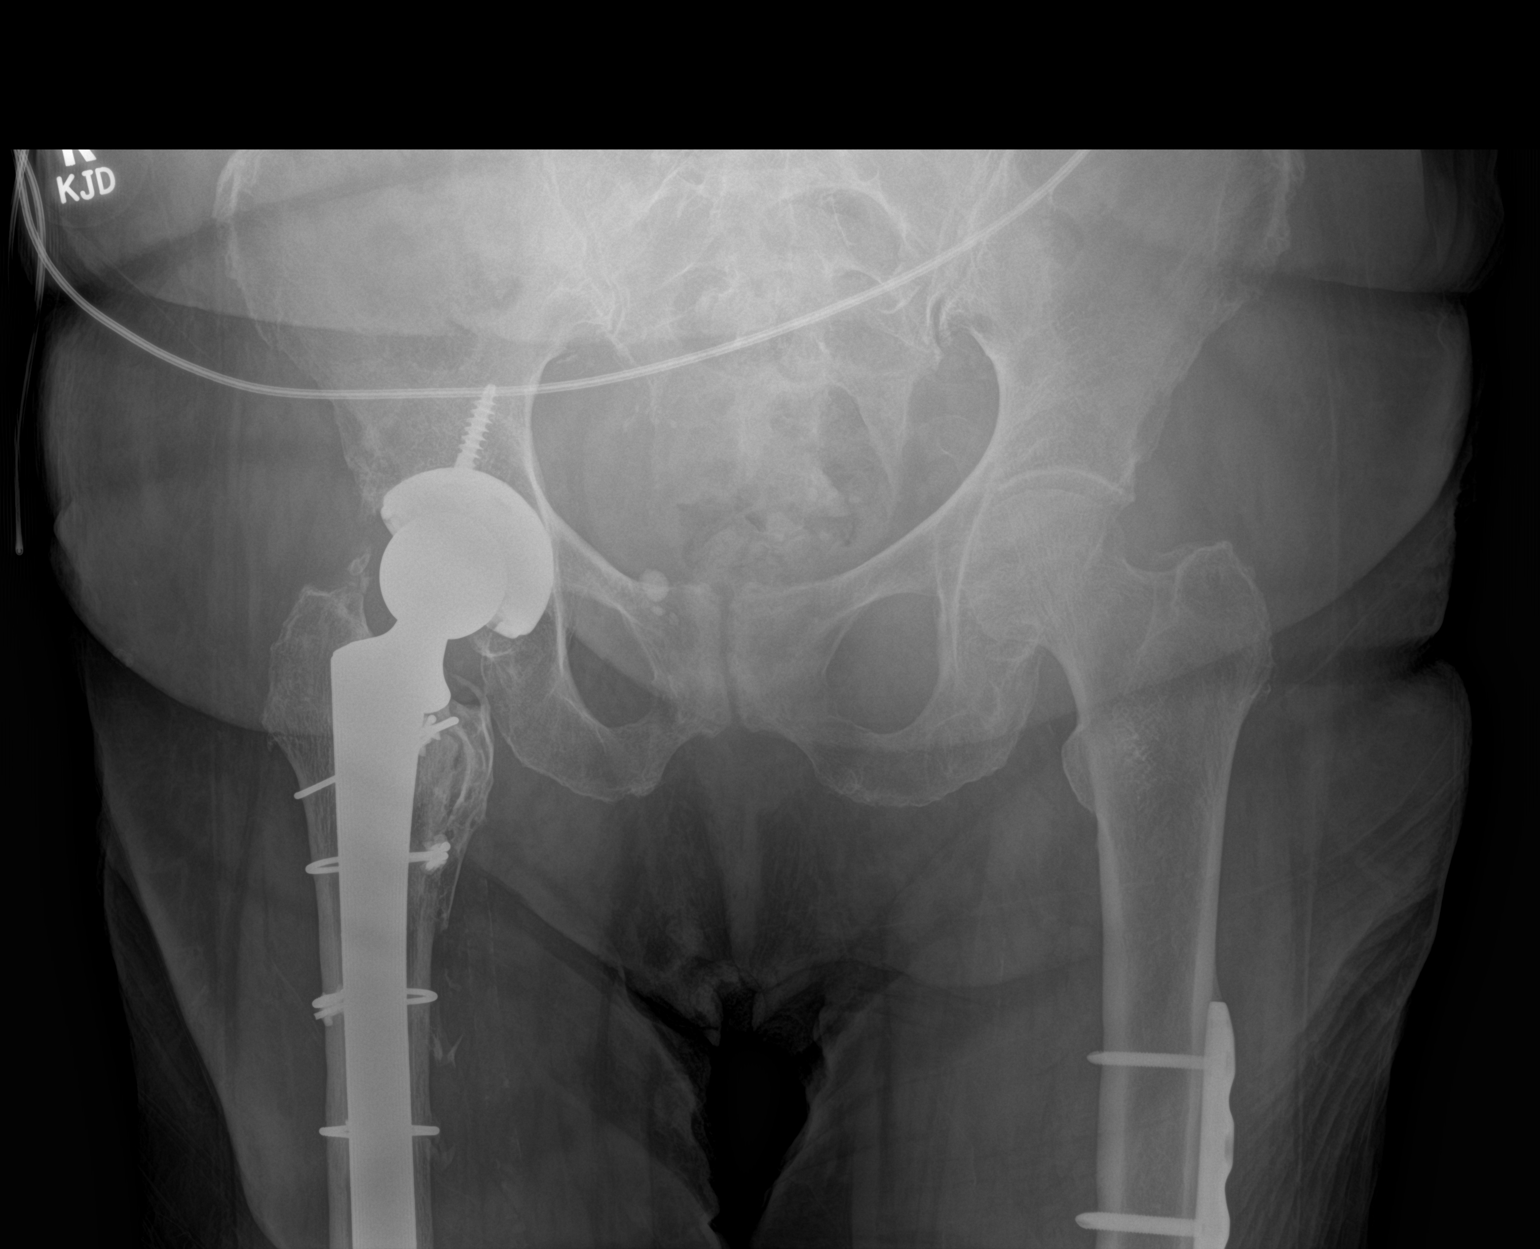

[2 of 2 positions shown; findings below may reference images not displayed]

FINDINGS: Right hip arthroplasty with cerclage wire fixation, intact were
visualized. Distal aspect of the femoral stem not entirely included
in the field of view. Lateral plate and screw fixation of the left
femur is partially included. No evidence of acute pelvic fracture.
Pubic rami appear intact. Pubic symphysis and sacroiliac joints are
congruent. The bones are under mineralized.
IMPRESSION: No evidence of acute fracture of the pelvis. Intact included right
hip hardware.

## 2021-12-07 IMAGING — DX DG SHOULDER 2+V PORT*R*
1 series · 2 of 2 positions shown · non-contrast
Comparison: None.

CLINICAL DATA: [AGE] post fall. Right shoulder pain.

EXAM:
PORTABLE RIGHT SHOULDER

[Series 1: shoulder · 0.14mm/px · 2 of 2 slices shown]
[im 1/2]
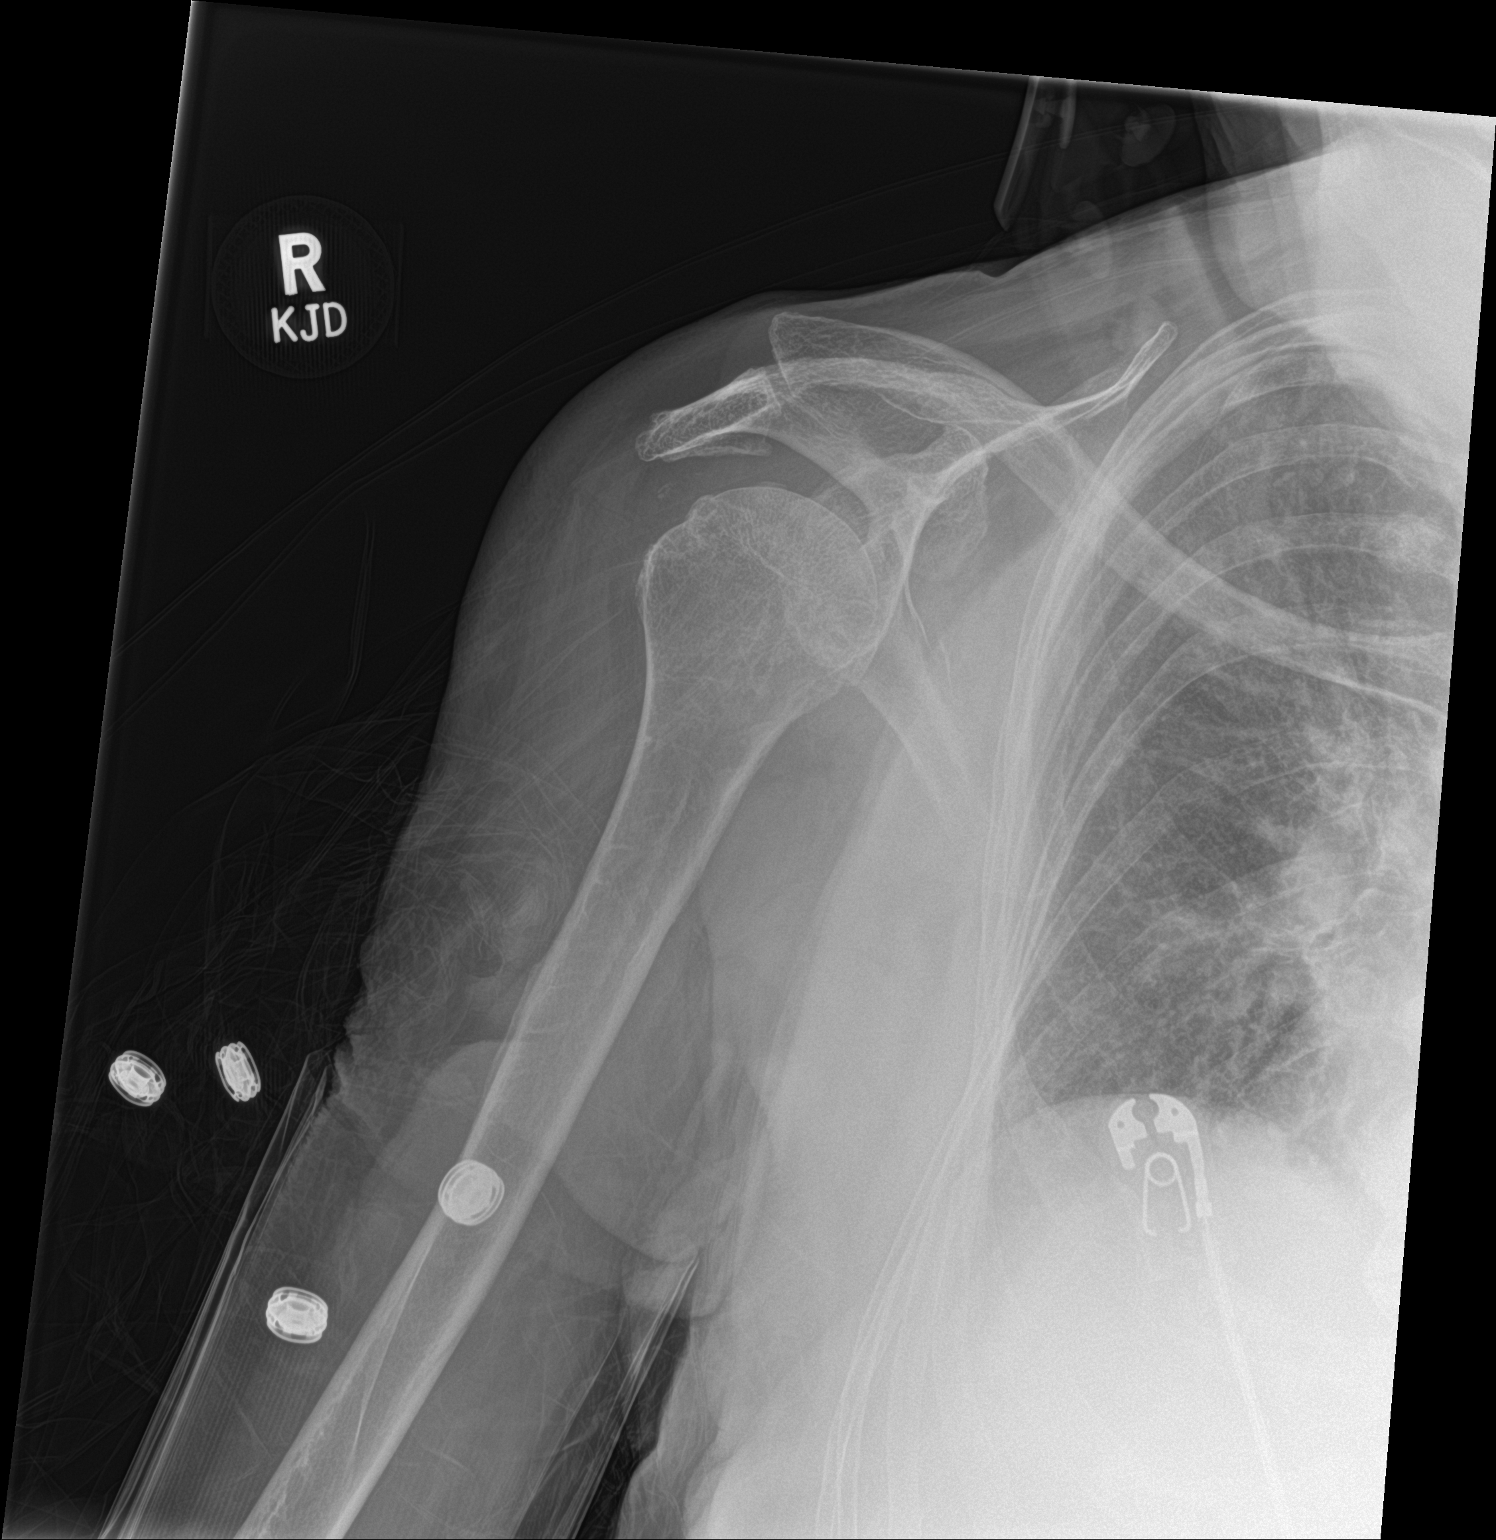
[im 2/2]
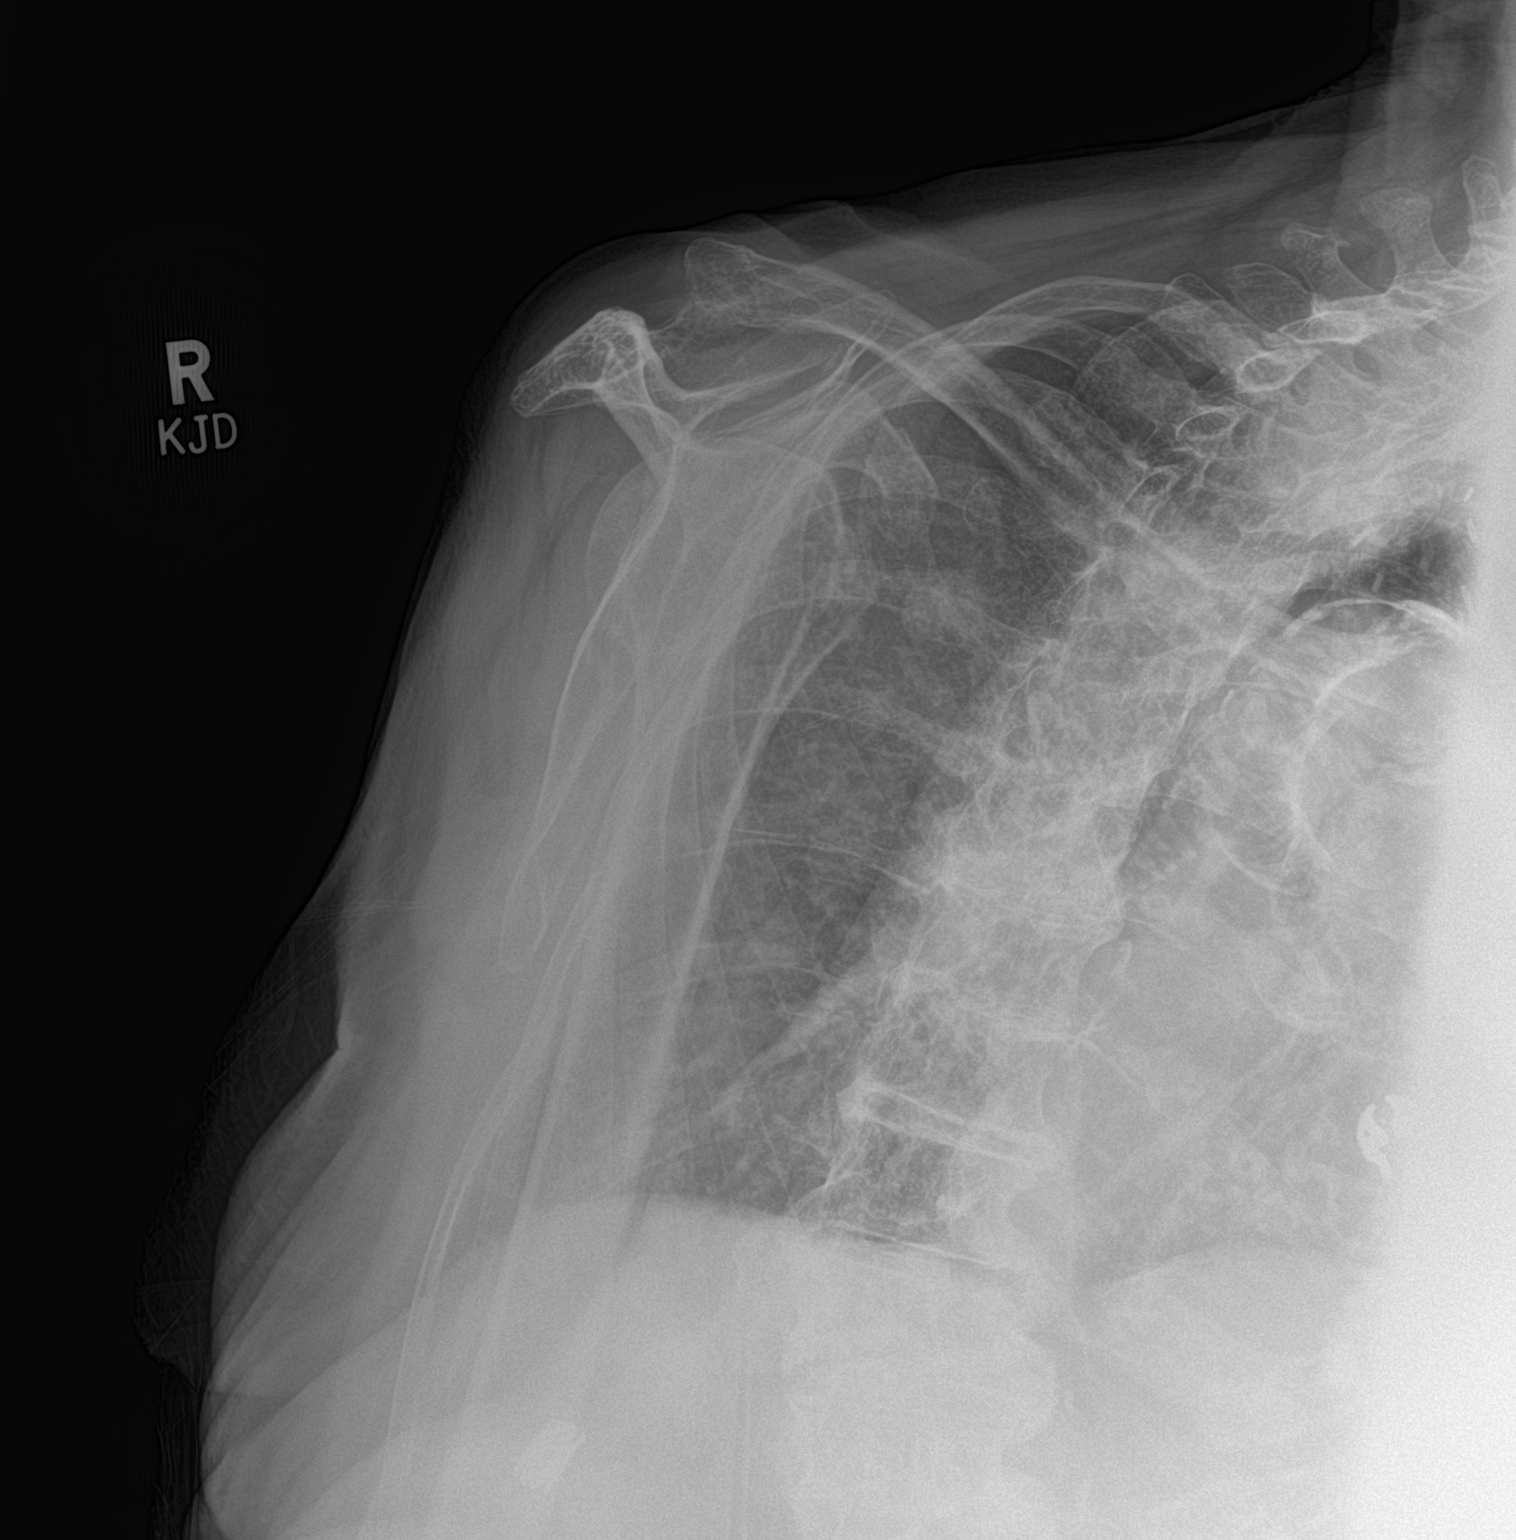

[2 of 2 positions shown; findings below may reference images not displayed]

FINDINGS: There is no evidence of fracture or dislocation. Mild
acromioclavicular degenerative change. Glenohumeral joint not well
assessed on provided views. Soft tissues are unremarkable.
IMPRESSION: No evidence of acute fracture or dislocation of the right shoulder.

## 2021-12-22 IMAGING — CT CT HIP*L* W/O CM
2 of 6 series · 17 of 46 positions shown, 19 images · non-contrast
Comparison: None.

CLINICAL DATA: Status post fall.

EXAM:
CT OF THE LEFT HIP WITHOUT CONTRAST
TECHNIQUE: Multidetector CT imaging of the left hip was performed according to
the standard protocol. Multiplanar CT image reconstructions were
also generated.

[Series 5: soft tissue · axial · 0.41mm/px · z∈[-768,-590]mm · 14 of 103 slices shown, 16 images]
[im 7/103  soft-tissue]
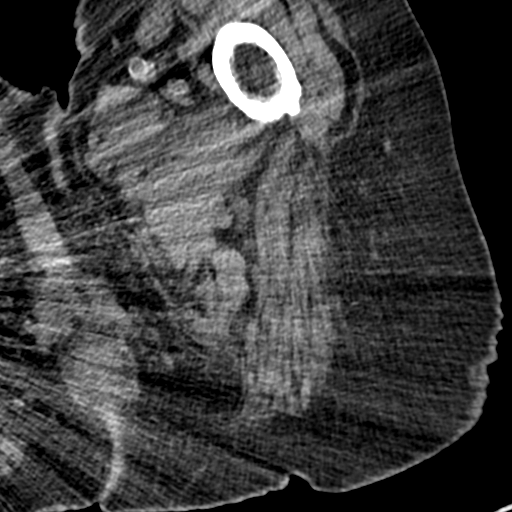
[im 7/103  bone]
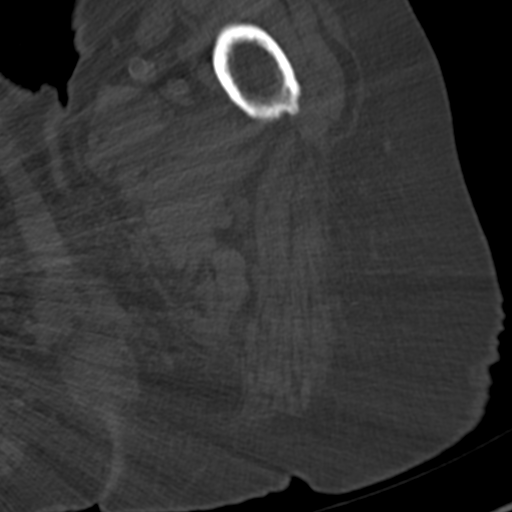
[im 14/103  soft-tissue]
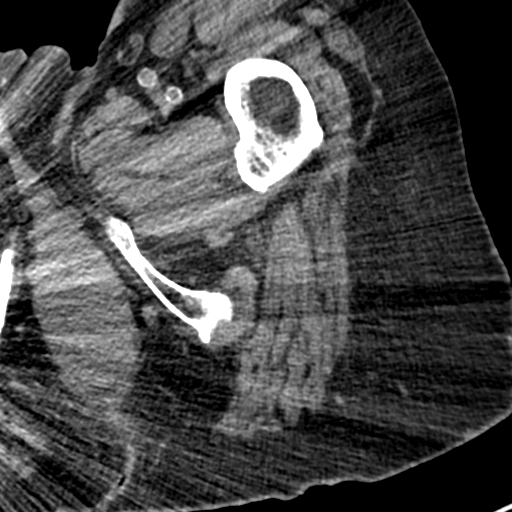
[im 21/103  soft-tissue]
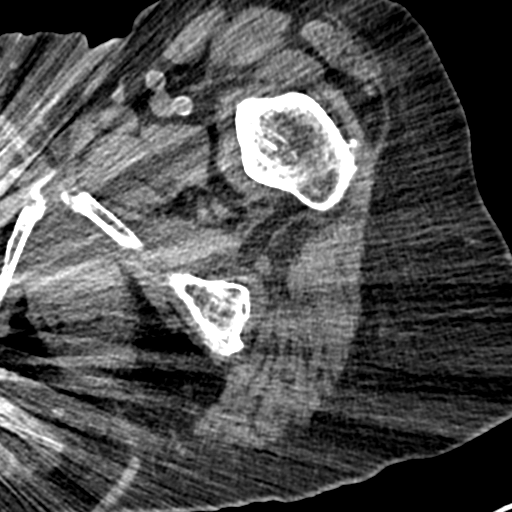
[im 28/103  soft-tissue]
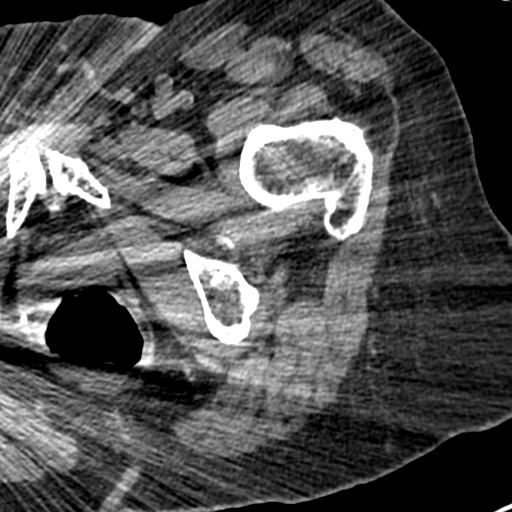
[im 35/103  soft-tissue]
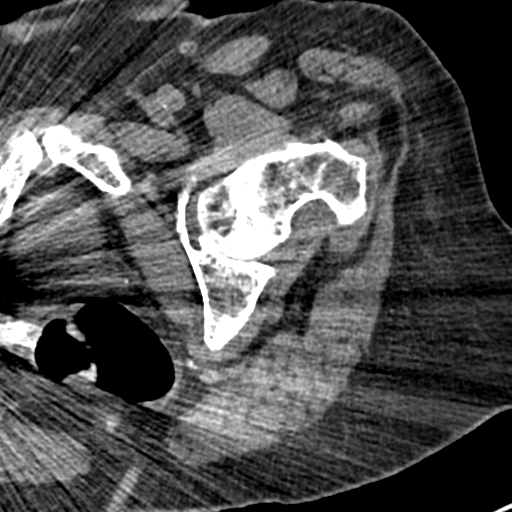
[im 41/103  soft-tissue]
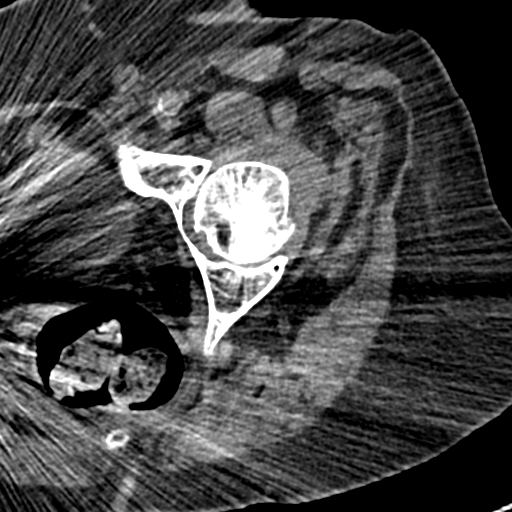
[im 48/103  soft-tissue]
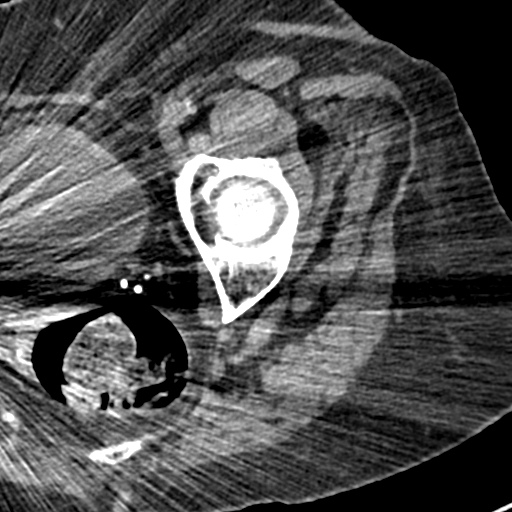
[im 55/103  soft-tissue]
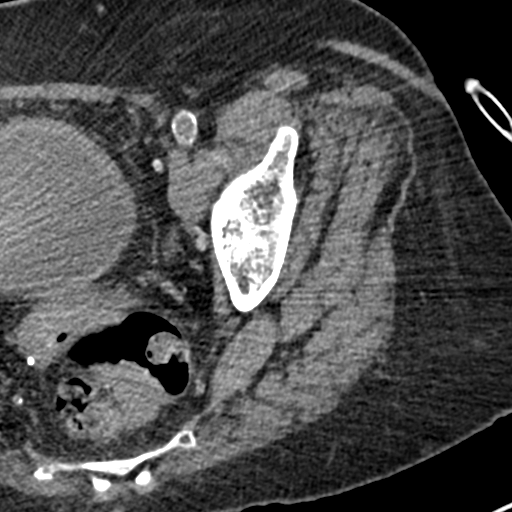
[im 62/103  soft-tissue]
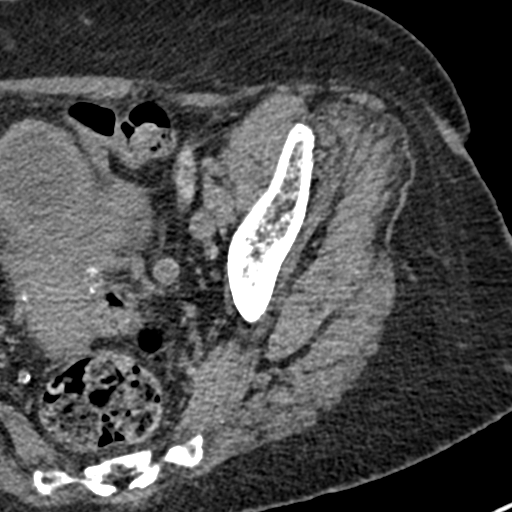
[im 62/103  bone]
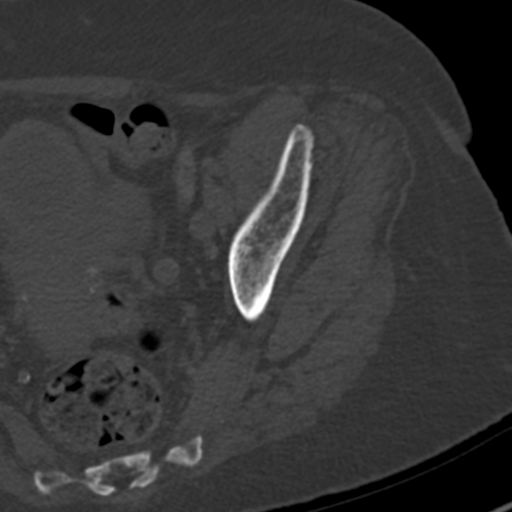
[im 69/103  soft-tissue]
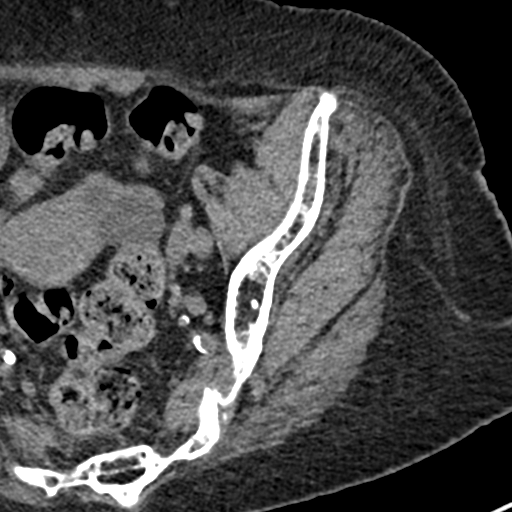
[im 75/103  soft-tissue]
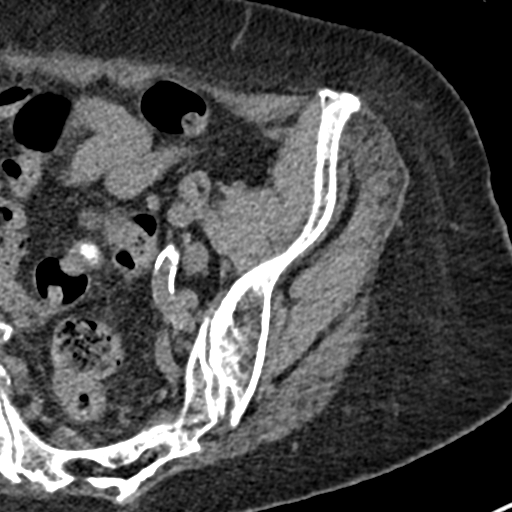
[im 82/103  soft-tissue]
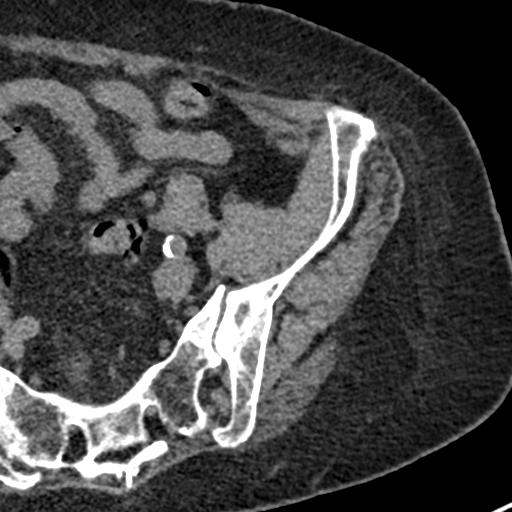
[im 89/103  soft-tissue]
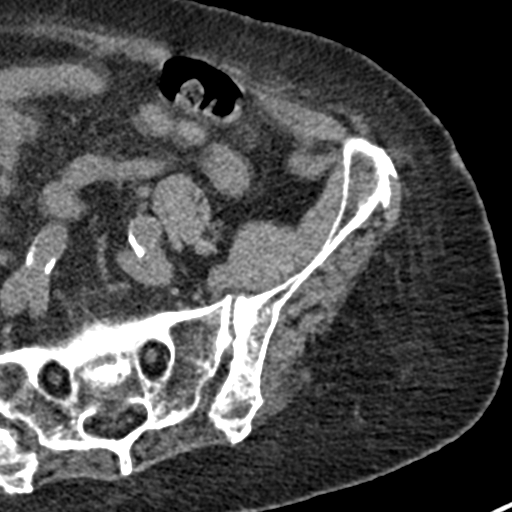
[im 96/103  soft-tissue]
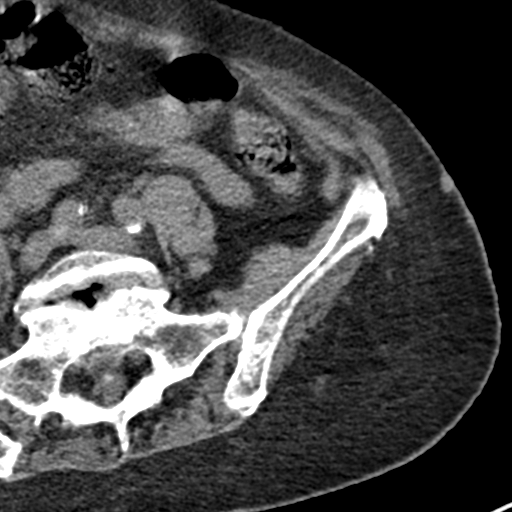

[Series 6: cor soft · coronal · 0.49mm/px · 3 of 96 slices shown]
[im 24/96  soft-tissue]
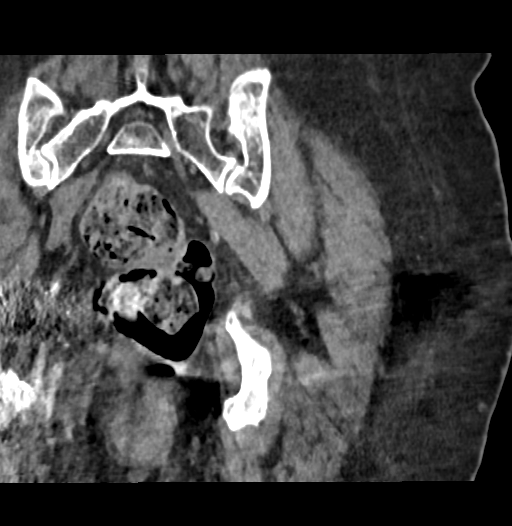
[im 48/96  soft-tissue]
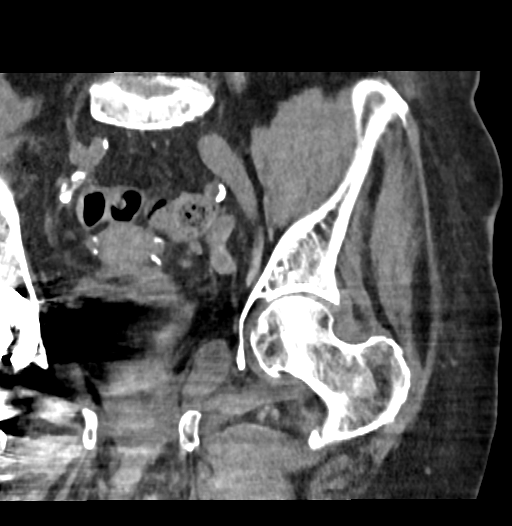
[im 72/96  soft-tissue]
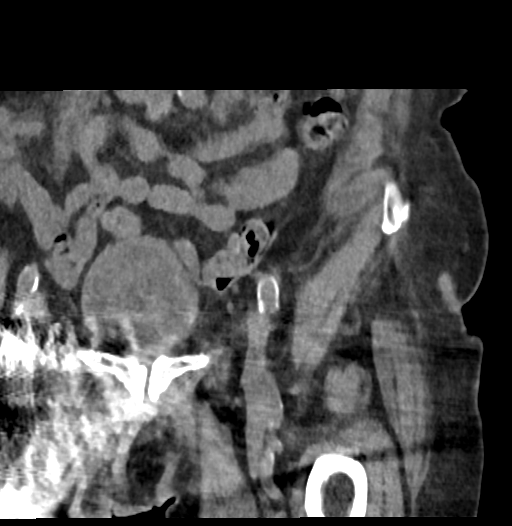

[17 of 46 positions shown; findings below may reference images not displayed]

FINDINGS: Bones/Joint/Cartilage

Normal left femoral head, neck, intertrochanteric region and
visualized proximal femur.

Mild sclerotic changes seen involving the left acetabulum with mild
narrowing of the left hip joint.
Normal visualized superior and inferior pubic rami and ischial
tuberosities.

A total right hip replacement is seen with associated streak
artifact and subsequently limited evaluation of the adjacent osseous
and soft tissue structures.

Moderate to marked severity degenerative changes seen within the
visualized portion of the lower lumbar spine.

Ligaments

Suboptimally assessed by CT.

Muscles and Tendons

There is no evidence of an intramuscular fluid collection, abscess
or hematoma.

Soft tissues

Multiple small left adnexal cysts are seen.

Noninflamed diverticula are seen within the sigmoid colon.
IMPRESSION: 1. No acute fracture or dislocation.
2. Mild osteoarthritis of the left hip joint.
3. Total right hip replacement.

## 2021-12-22 IMAGING — CT CT HEAD W/O CM
4 series · 17 of 47 positions shown, 19 images · non-contrast
Comparison: October 20, 2019

CLINICAL DATA: Status post trauma.

EXAM:
CT HEAD WITHOUT CONTRAST
TECHNIQUE: Contiguous axial images were obtained from the base of the skull
through the vertex without intravenous contrast.

[Series 3: head bone · axial · 0.41mm/px · z∈[-157,-101]mm · 4 of 79 slices shown]
[im 8/79  bone]
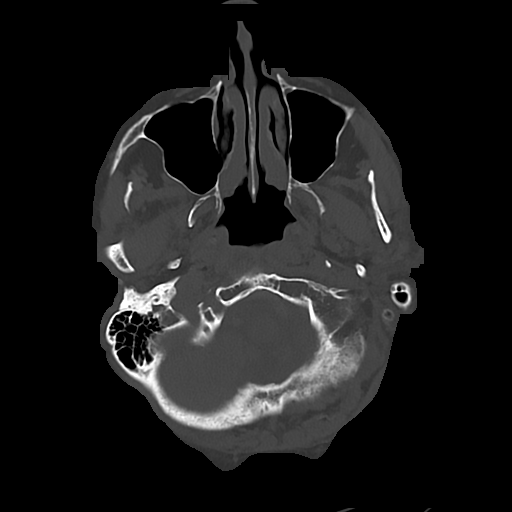
[im 16/79  bone]
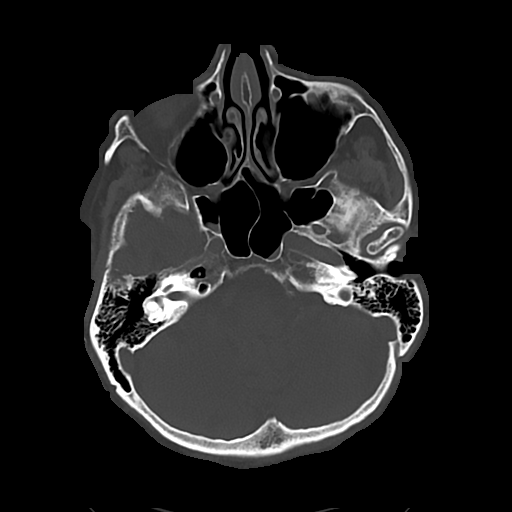
[im 24/79  bone]
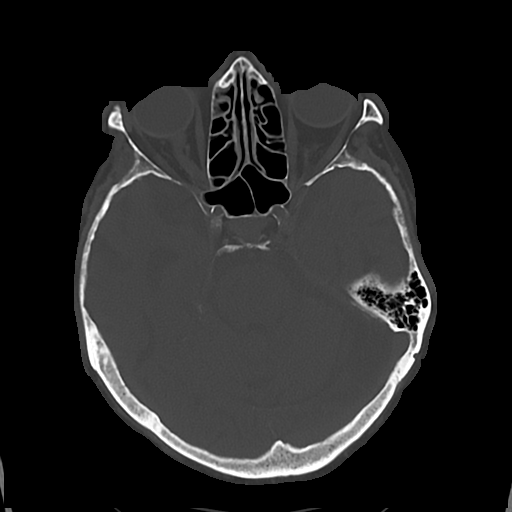
[im 36/79  bone]
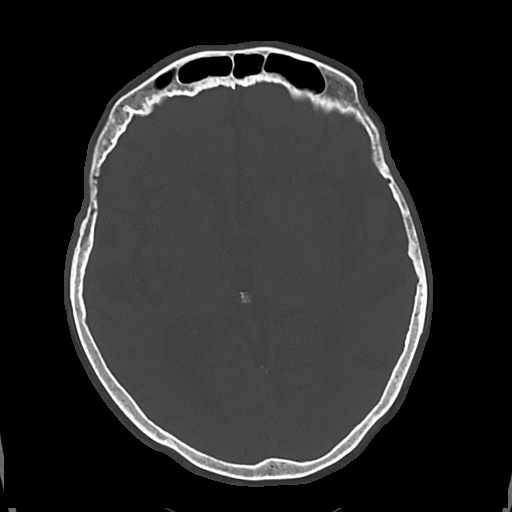

[Series 4: head wo · axial · 0.41mm/px · z∈[-156,-36]mm · 7 of 32 slices shown, 9 images]
[im 4/32  brain]
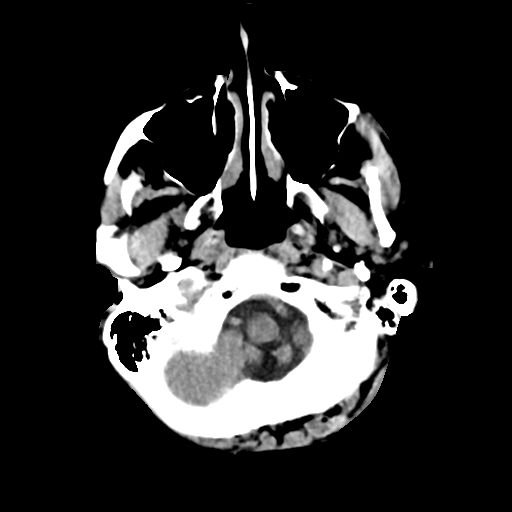
[im 4/32  bone]
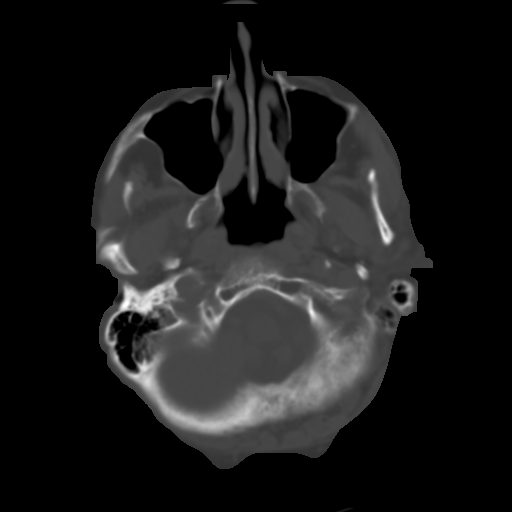
[im 8/32  brain]
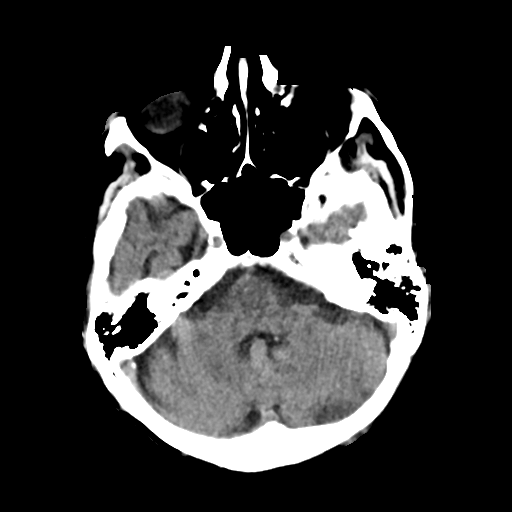
[im 12/32  brain]
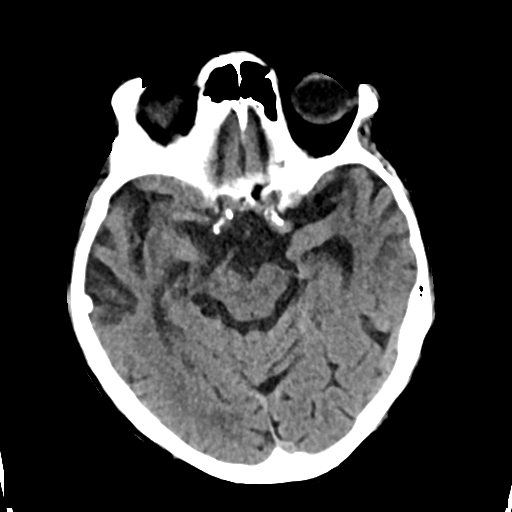
[im 16/32  brain]
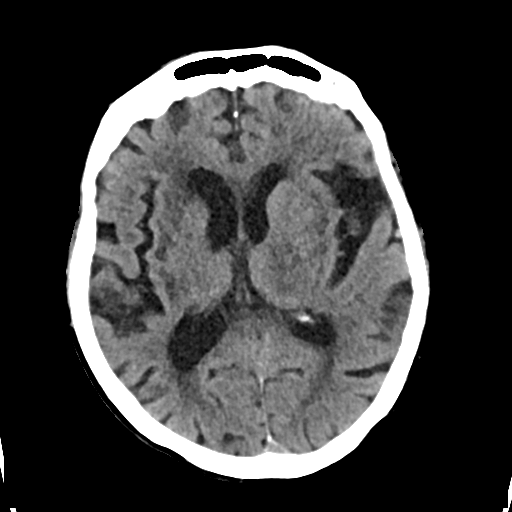
[im 20/32  brain]
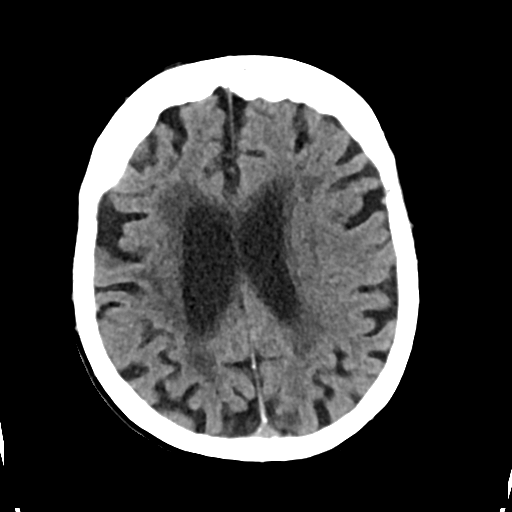
[im 20/32  bone]
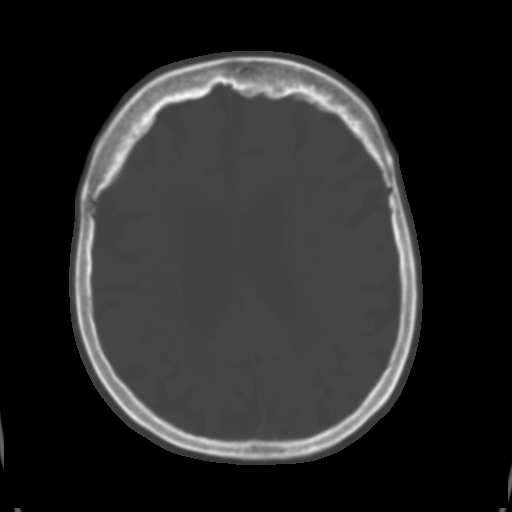
[im 24/32  brain]
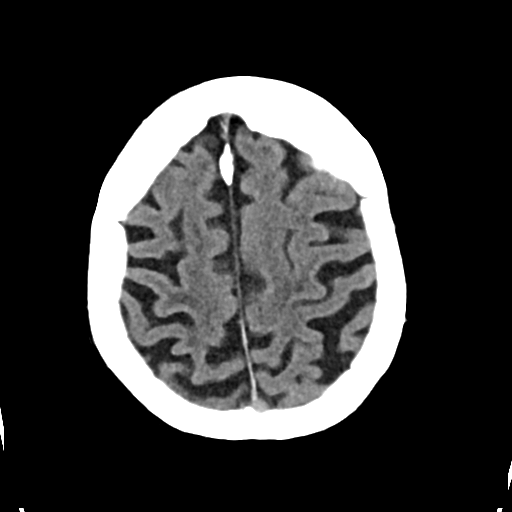
[im 28/32  brain]
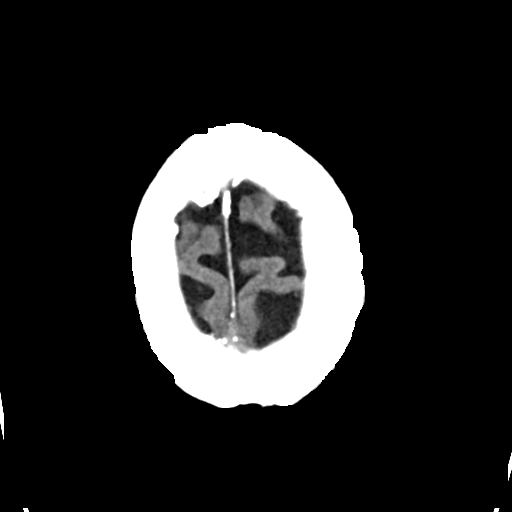

[Series 5: cor soft · coronal · 0.35mm/px · 3 of 65 slices shown]
[im 22/65  brain]
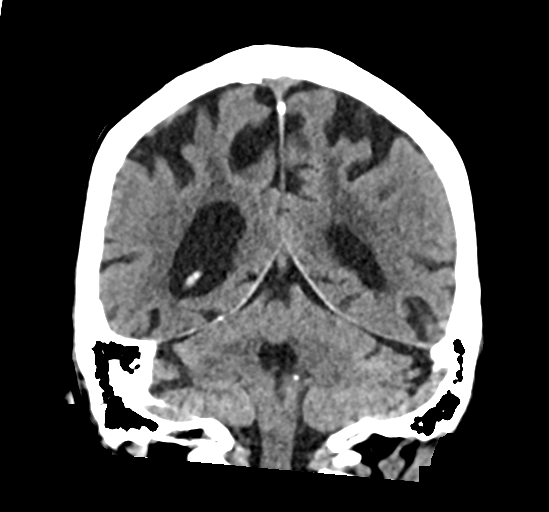
[im 29/65  brain]
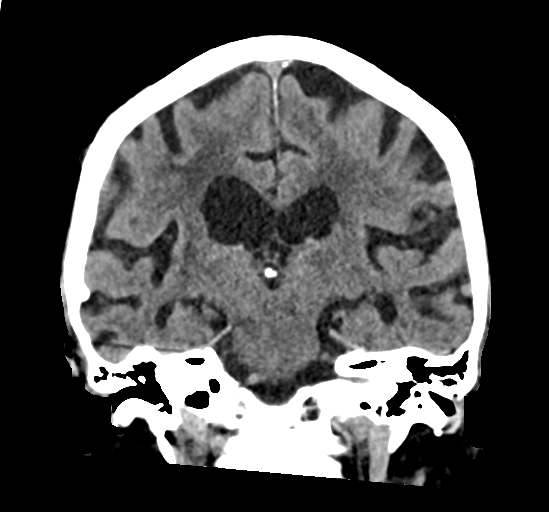
[im 36/65  brain]
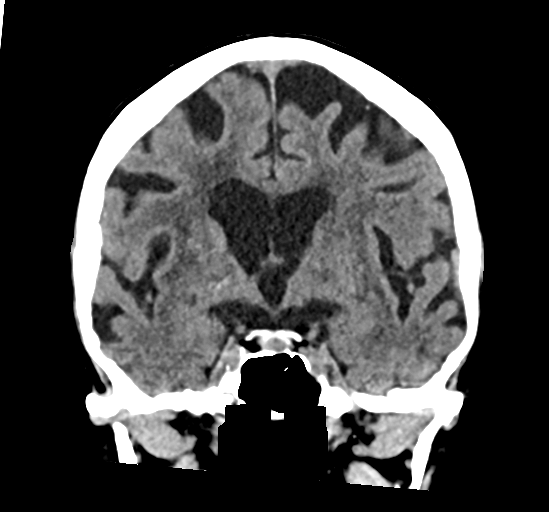

[Series 6: sag soft · sagittal · 0.34mm/px · 3 of 53 slices shown]
[im 18/53  brain]
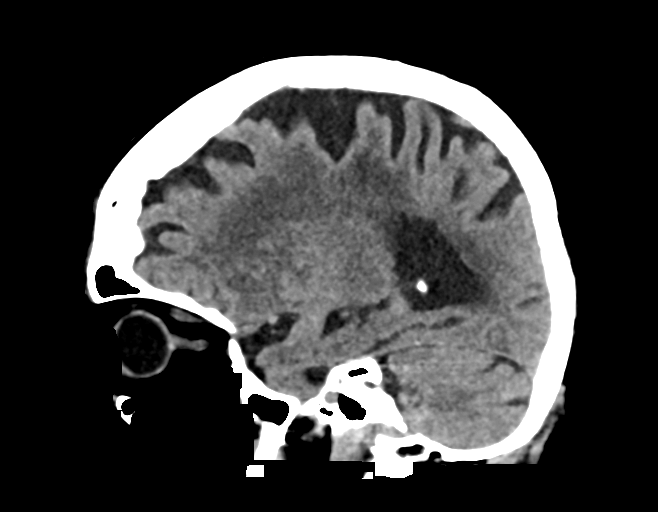
[im 27/53  brain]
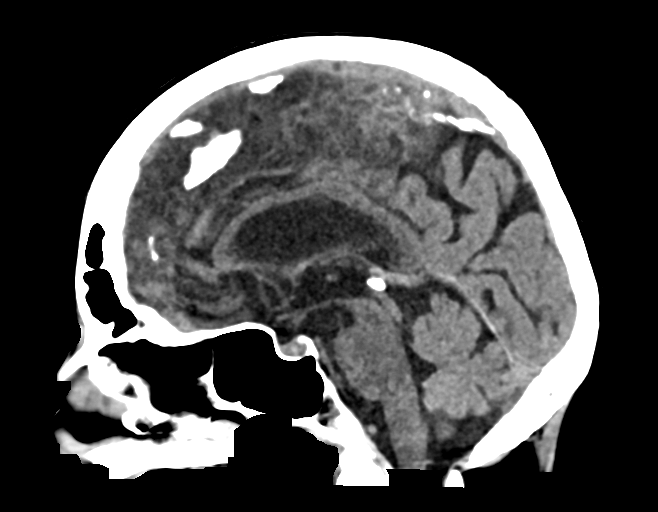
[im 35/53  brain]
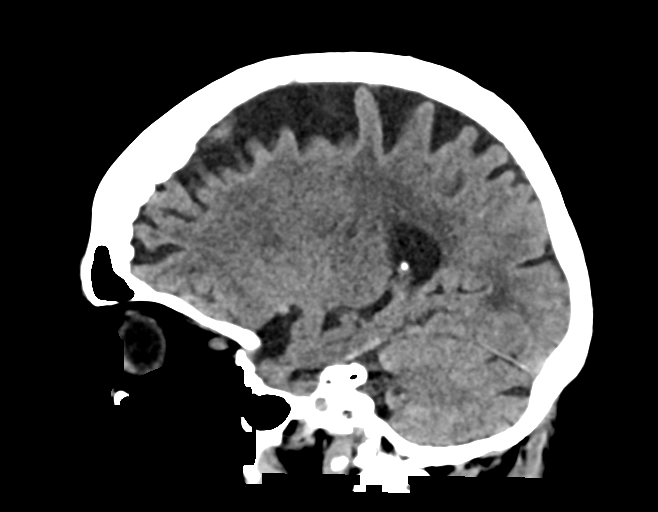

[17 of 47 positions shown; findings below may reference images not displayed]

FINDINGS: Brain: There is moderate cerebral atrophy with widening of the
extra-axial spaces and ventricular dilatation.
There are areas of decreased attenuation within the white matter
tracts of the supratentorial brain, consistent with microvascular
disease changes.

Vascular: No hyperdense vessel or unexpected calcification.

Skull: Normal. Negative for fracture or focal lesion.

Sinuses/Orbits: No acute finding.

Other: None.
IMPRESSION: 1. Generalized cerebral atrophy.
2. No acute intracranial abnormality.

## 2021-12-22 IMAGING — DX DG CHEST 1V PORT
1 series · 1 of 1 positions shown · non-contrast
Comparison: October 20, 2019

CLINICAL DATA: Status post trauma.

EXAM:
PORTABLE CHEST 1 VIEW

[chest ap]
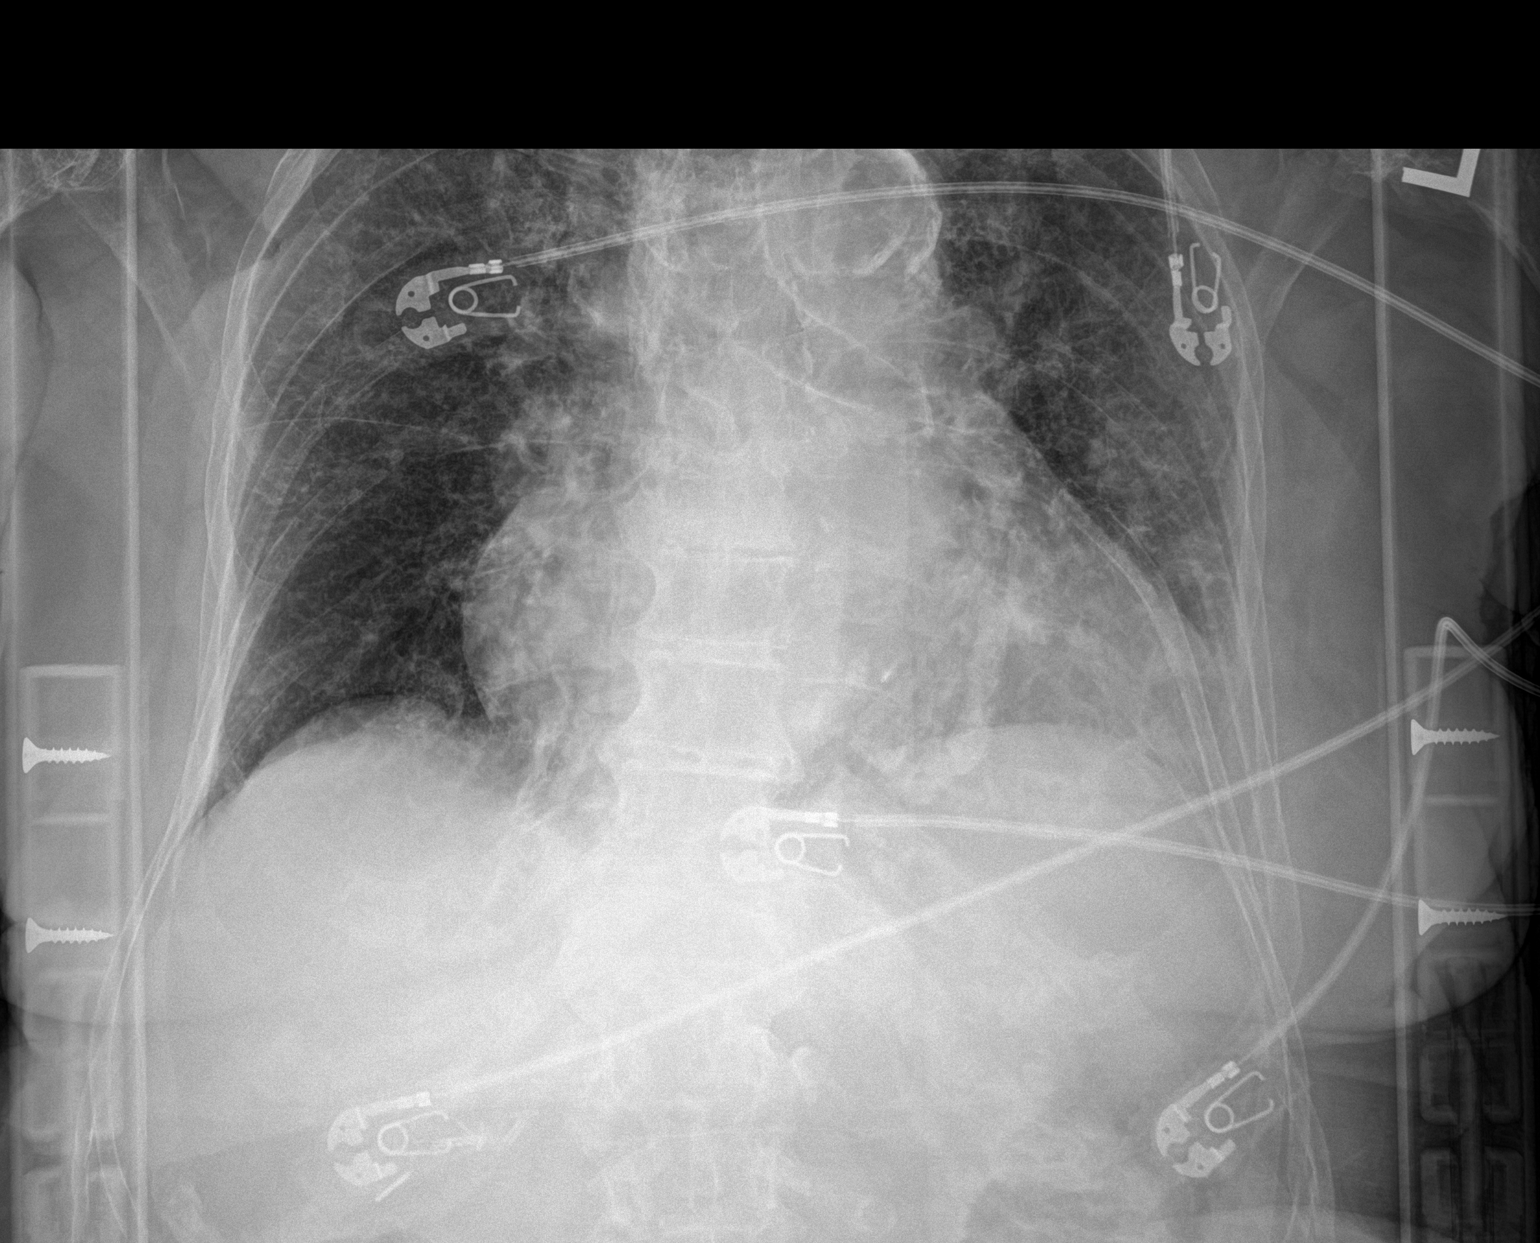

[1 of 1 positions shown; findings below may reference images not displayed]

FINDINGS: Stable, moderate severity chronic appearing increased interstitial
lung markings are seen. There is no evidence of a pleural effusion
or pneumothorax. The cardiac silhouette is markedly enlarged and
unchanged in size. There is marked severity calcification of the
aortic arch. Multilevel degenerative changes seen throughout the
thoracic spine.
IMPRESSION: Stable, marked cardiomegaly and chronic-appearing increased lung
markings without evidence of acute or active cardiopulmonary
disease.

## 2021-12-22 IMAGING — DX DG PORTABLE PELVIS
1 series · 1 of 1 positions shown · non-contrast
Comparison: October 20, 2019

CLINICAL DATA: Status post trauma.

EXAM:
PORTABLE PELVIS 1-2 VIEWS

[pelvis ap]
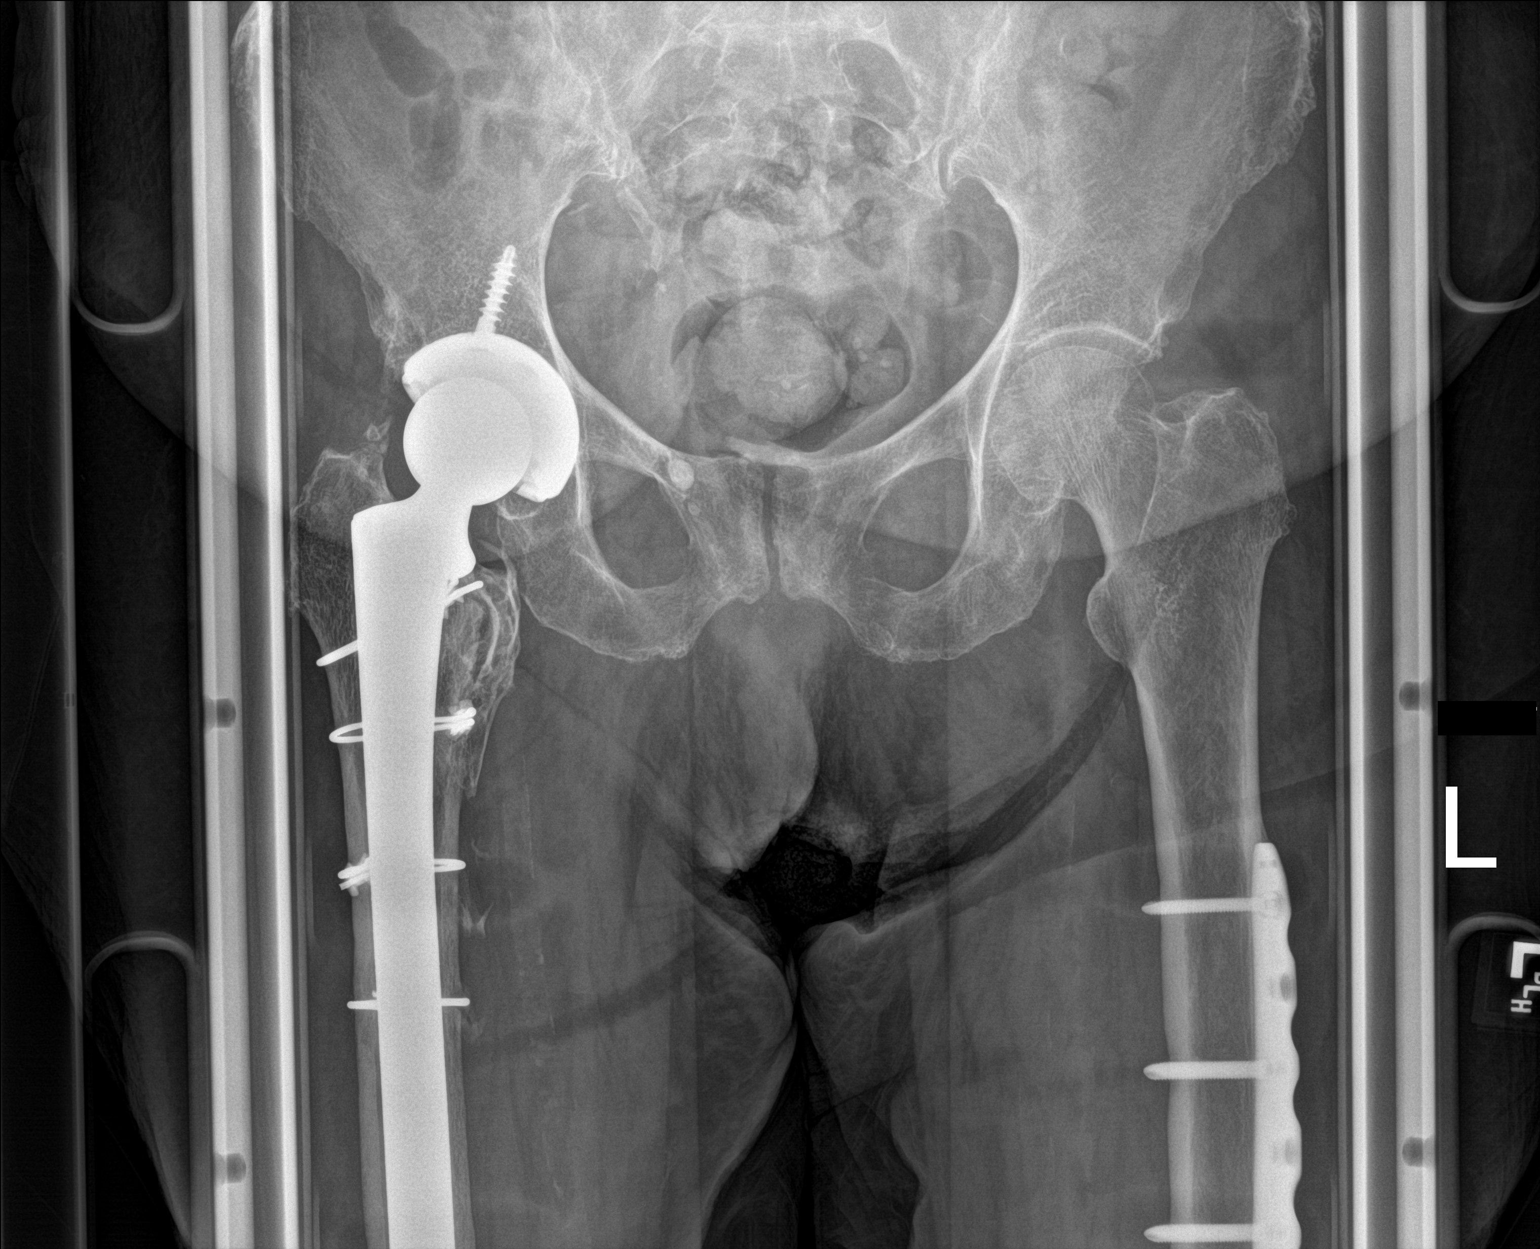

[1 of 1 positions shown; findings below may reference images not displayed]

FINDINGS: A total right hip replacement is seen. There is no evidence of
surrounding lucency to suggest the presence of hardware loosening or
infection. A radiopaque fixation plate and multiple fixation screws
are seen overlying the visualized portion of the mid left femoral
shaft. There is no evidence of an acute pelvic fracture or
diastasis. No pelvic bone lesions are seen. Subcentimeter
phleboliths are seen overlying the lower right hemipelvis.
IMPRESSION: 1. No evidence of an acute pelvic fracture or diastasis.
2. Right total hip replacement without evidence of hardware
loosening or infection.

## 2021-12-22 IMAGING — CT CT CERVICAL SPINE W/O CM
3 of 4 series · 13 of 33 positions shown, 16 images · non-contrast
Comparison: October 20, 2019

CLINICAL DATA: Status post fall.

EXAM:
CT CERVICAL SPINE WITHOUT CONTRAST
TECHNIQUE: Multidetector CT imaging of the cervical spine was performed without
intravenous contrast. Multiplanar CT image reconstructions were also
generated.

[Series 8: sag bone · sagittal · 0.33mm/px · 5 of 49 slices shown, 6 images]
[im 17/49  bone]
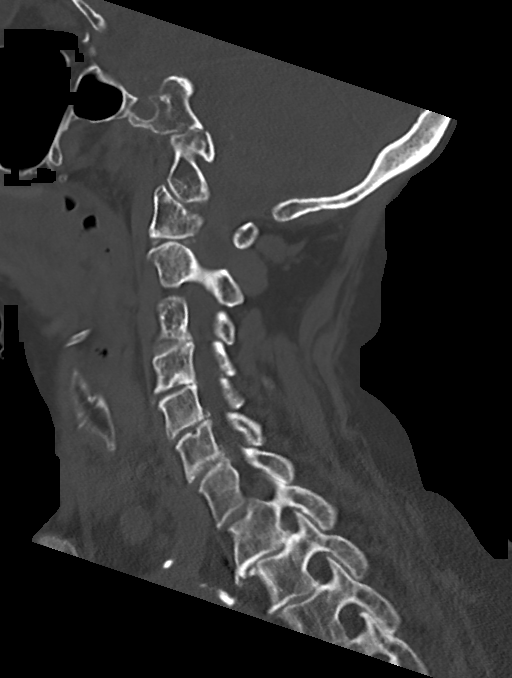
[im 21/49  bone]
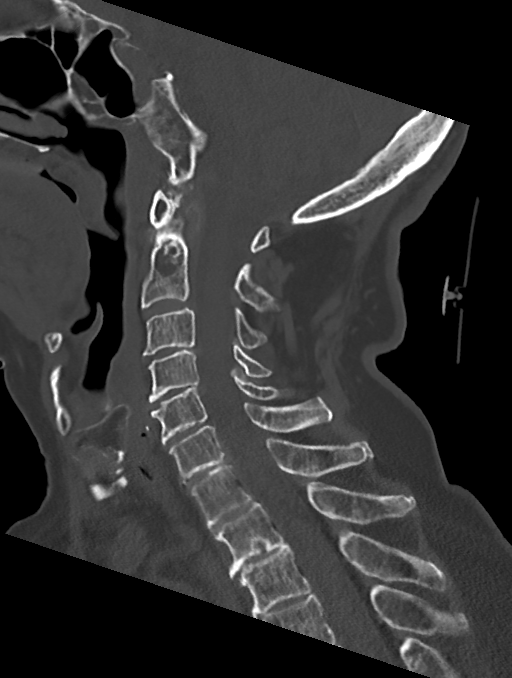
[im 25/49  soft-tissue]
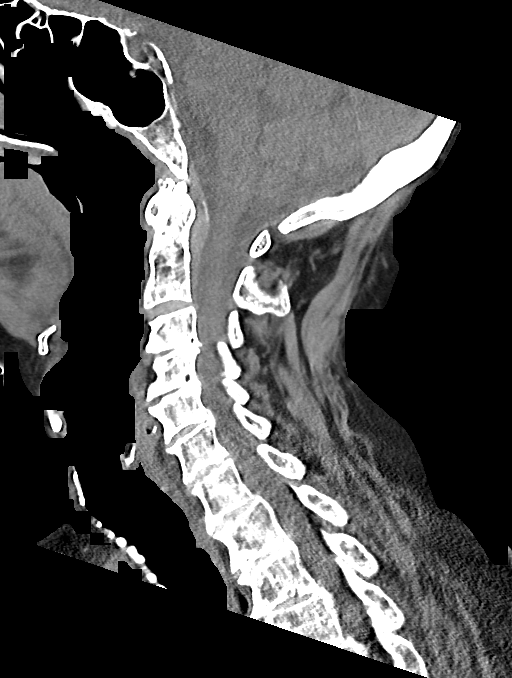
[im 25/49  bone]
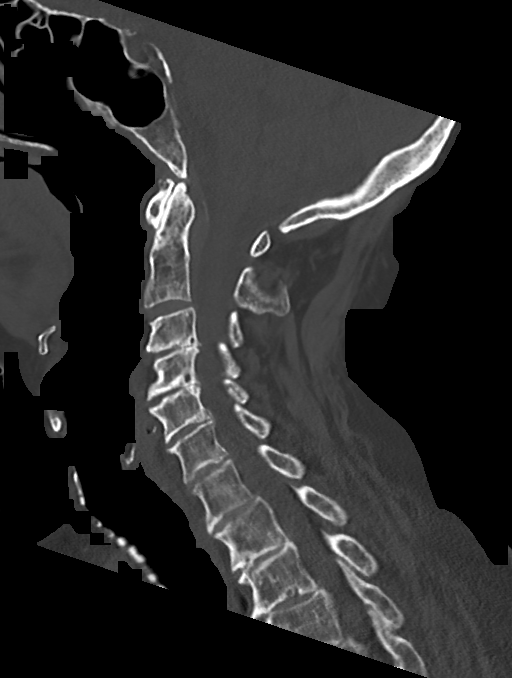
[im 29/49  bone]
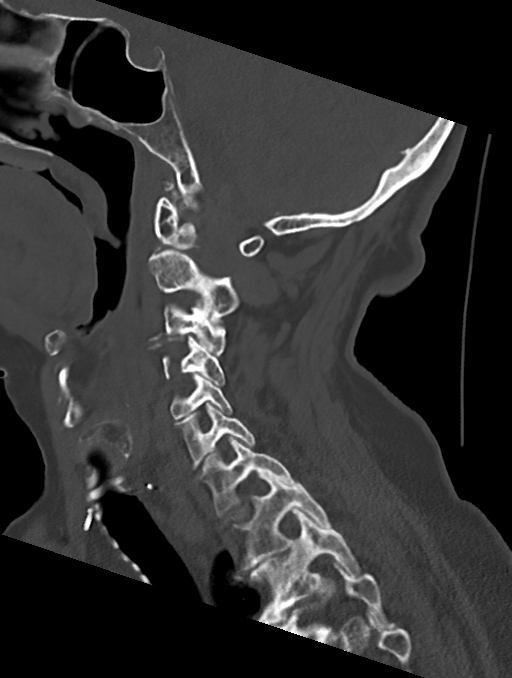
[im 33/49  bone]
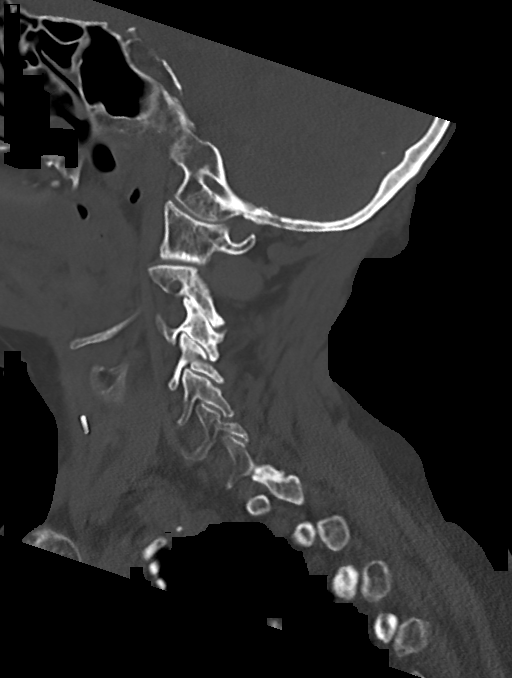

[Series 9: cor bone · coronal · 0.31mm/px · 3 of 63 slices shown]
[im 13/63  bone]
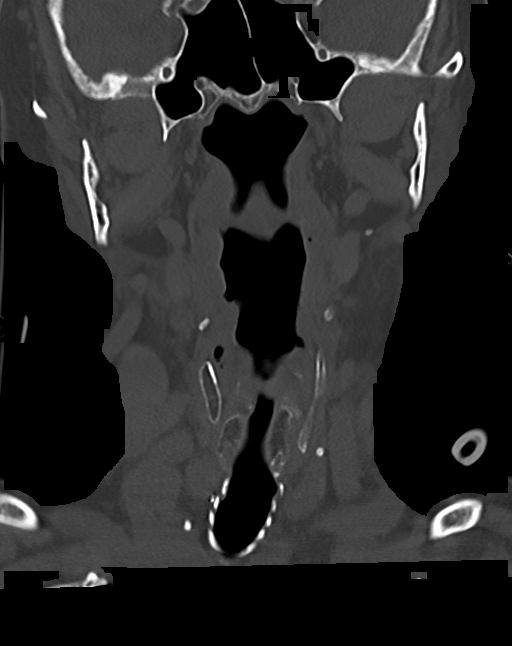
[im 25/63  bone]
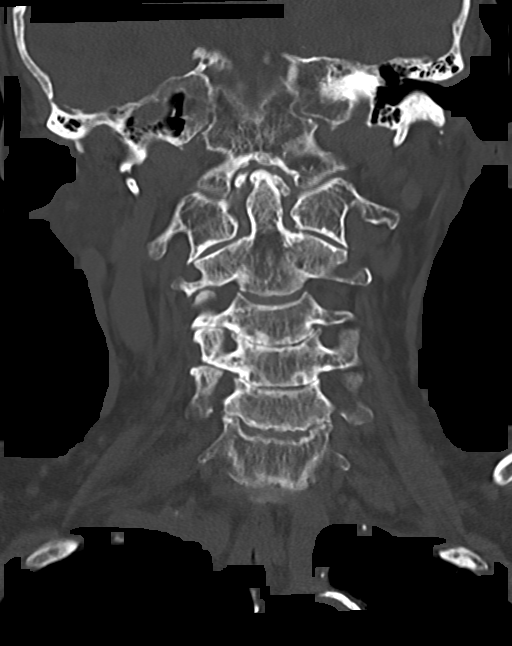
[im 38/63  bone]
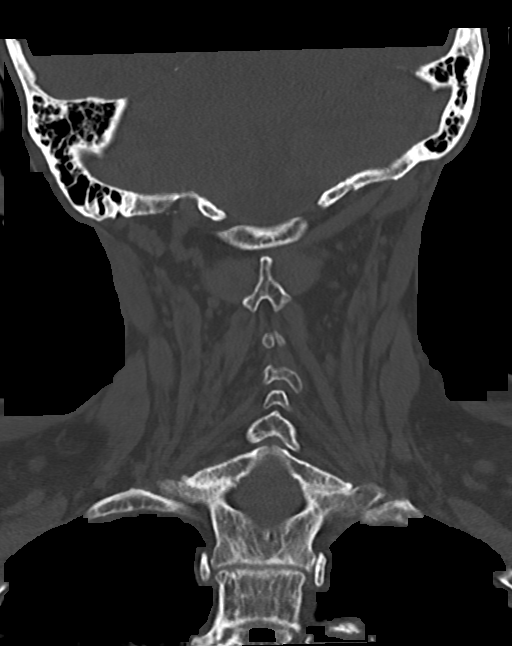

[Series 10: c spine bone thins · axial · 0.31mm/px · z∈[-313,-181]mm · 5 of 108 slices shown, 7 images]
[im 16/108  soft-tissue]
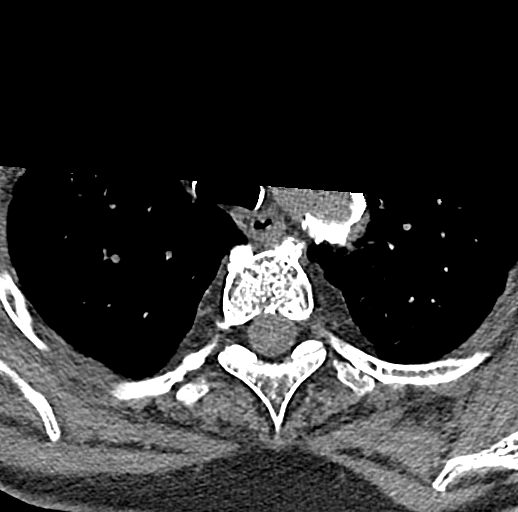
[im 16/108  bone]
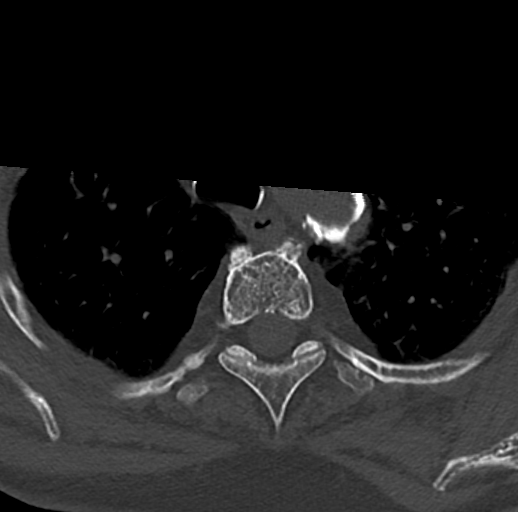
[im 31/108  bone]
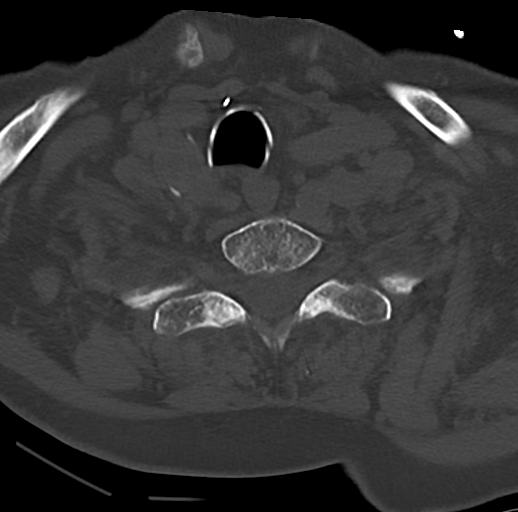
[im 62/108  bone]
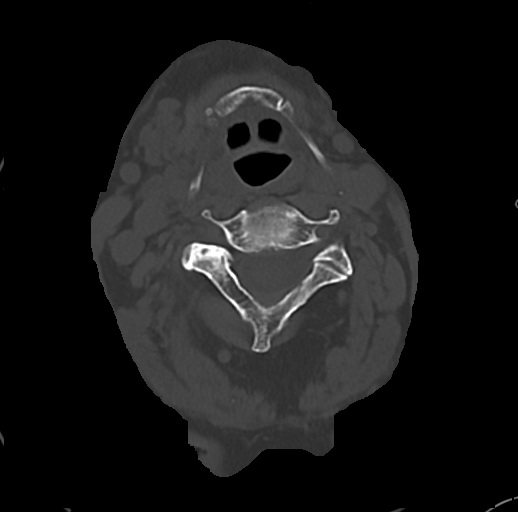
[im 77/108  bone]
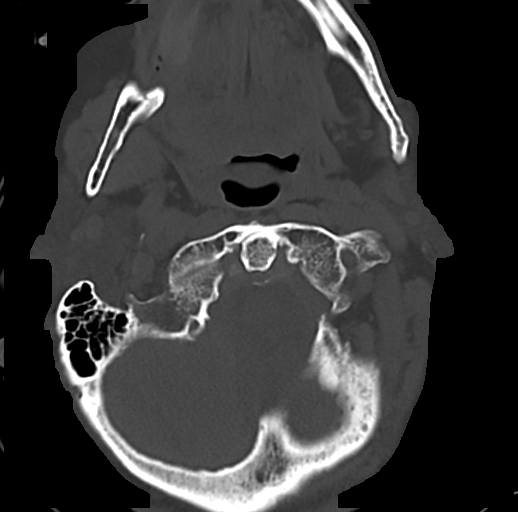
[im 92/108  soft-tissue]
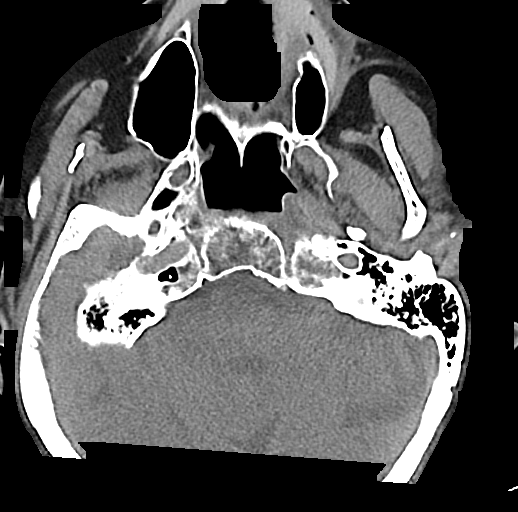
[im 92/108  bone]
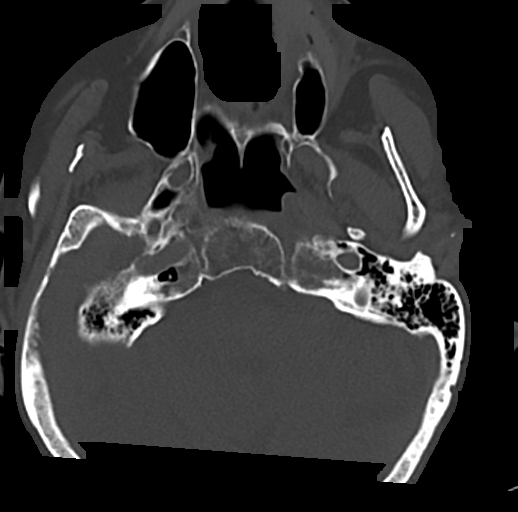

[13 of 33 positions shown; findings below may reference images not displayed]

FINDINGS: Alignment: Approximately 2 mm retrolisthesis of the C4 on C5
vertebral body is seen.

Skull base and vertebrae: No acute fracture. No primary bone lesion
or focal pathologic process.

Soft tissues and spinal canal: No prevertebral fluid or swelling. No
visible canal hematoma.

Disc levels: C2-3: There is mild end plate spondylosis. Mild disc
space narrowing is seen. Bilateral facet hypertrophy is noted.
Normal central canal and intervertebral neuroforamina.

C3-4: There is moderate severity end plate spondylosis. Moderate to
marked severity disc space narrowing is seen. Bilateral facet
hypertrophy is noted. Normal central canal and intervertebral
neuroforamina.

C4-5: There is moderate severity end plate spondylosis. Moderate to
marked severity disc space narrowing is seen. Bilateral facet
hypertrophy is noted. Normal central canal and intervertebral
neuroforamina.

C5-6: There is moderate severity end plate spondylosis. Moderate
severity disc space narrowing is seen. Bilateral facet hypertrophy
is noted. Normal central canal and intervertebral neuroforamina.

C6-7: There is moderate severity end plate spondylosis. Moderate
severity disc space narrowing is seen. Bilateral facet hypertrophy
is noted. Normal central canal and intervertebral neuroforamina.

C7-T1: There is moderate severity end plate spondylosis. Moderate
severity disc space narrowing is seen. Bilateral facet hypertrophy
is noted. Normal central canal and intervertebral neuroforamina.

Upper chest: Negative.

Other: None.
IMPRESSION: 1. Multilevel disc spondylosis and facet hypertrophy.
2. Approximately 2 mm retrolisthesis of C4 on C5, likely
degenerative.
3. No evidence of acute fracture.

## 2021-12-22 IMAGING — DX DG KNEE COMPLETE 4+V*R*
1 series · 4 of 4 positions shown · non-contrast
Comparison: None.

CLINICAL DATA: Fall from wheelchair.

EXAM:
RIGHT KNEE - COMPLETE 4+ VIEW

[Series 1: knee · 0.14mm/px · 4 of 4 slices shown]
[im 1/4]
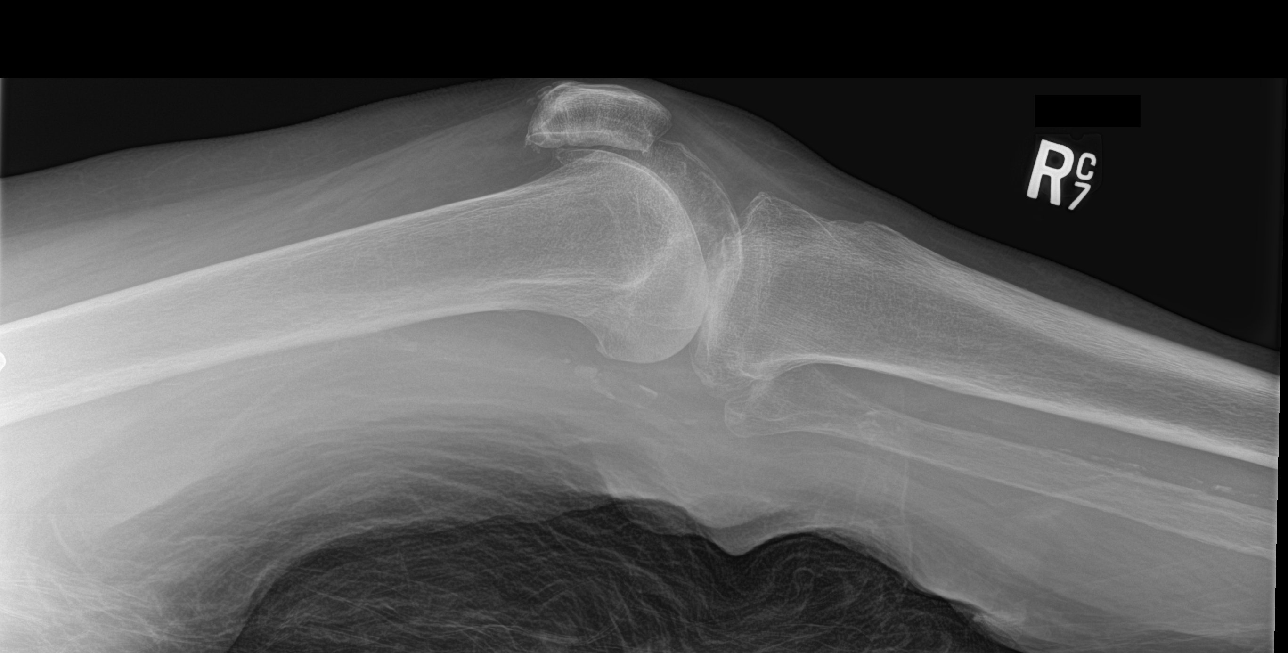
[im 2/4]
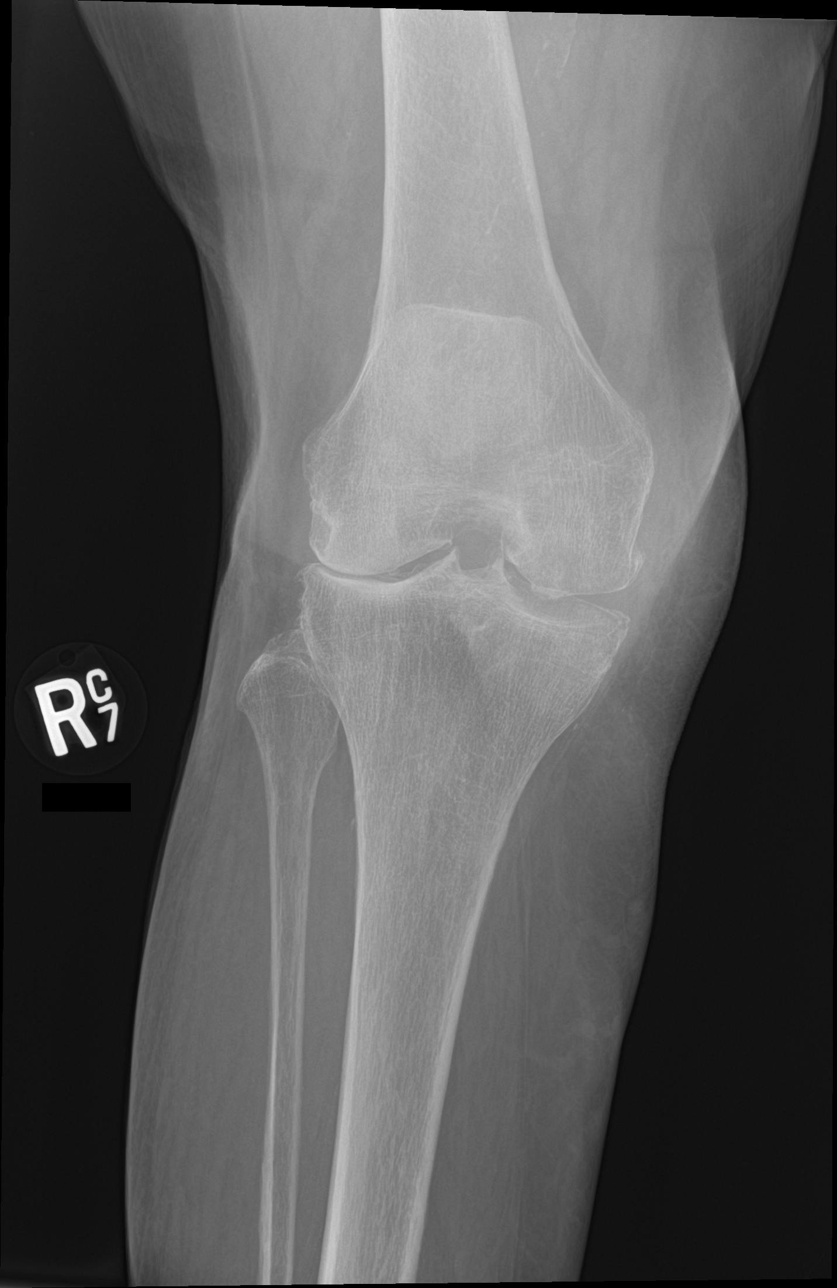
[im 3/4]
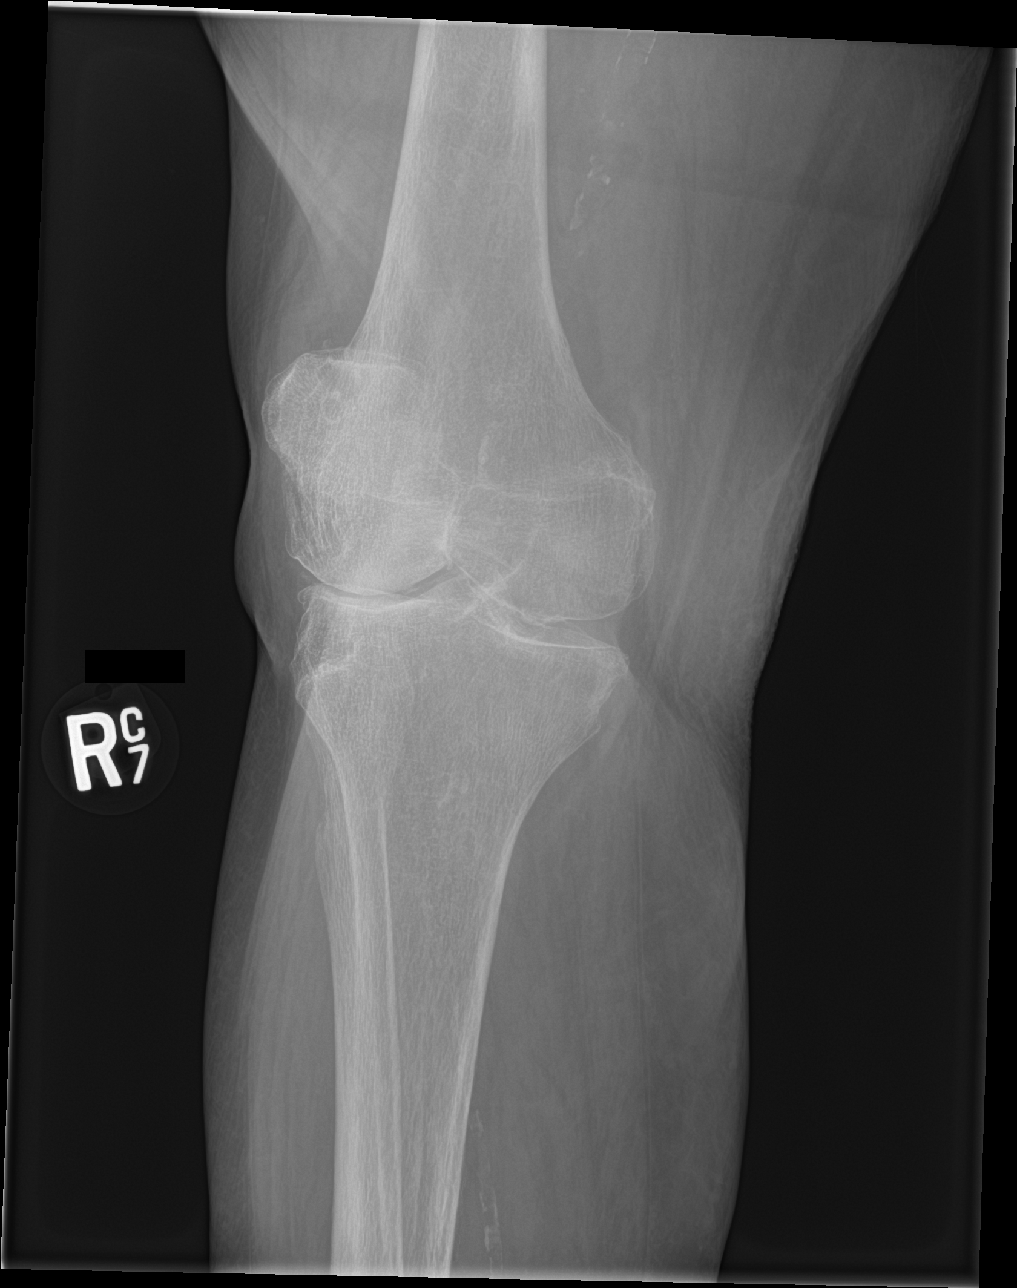
[im 4/4]
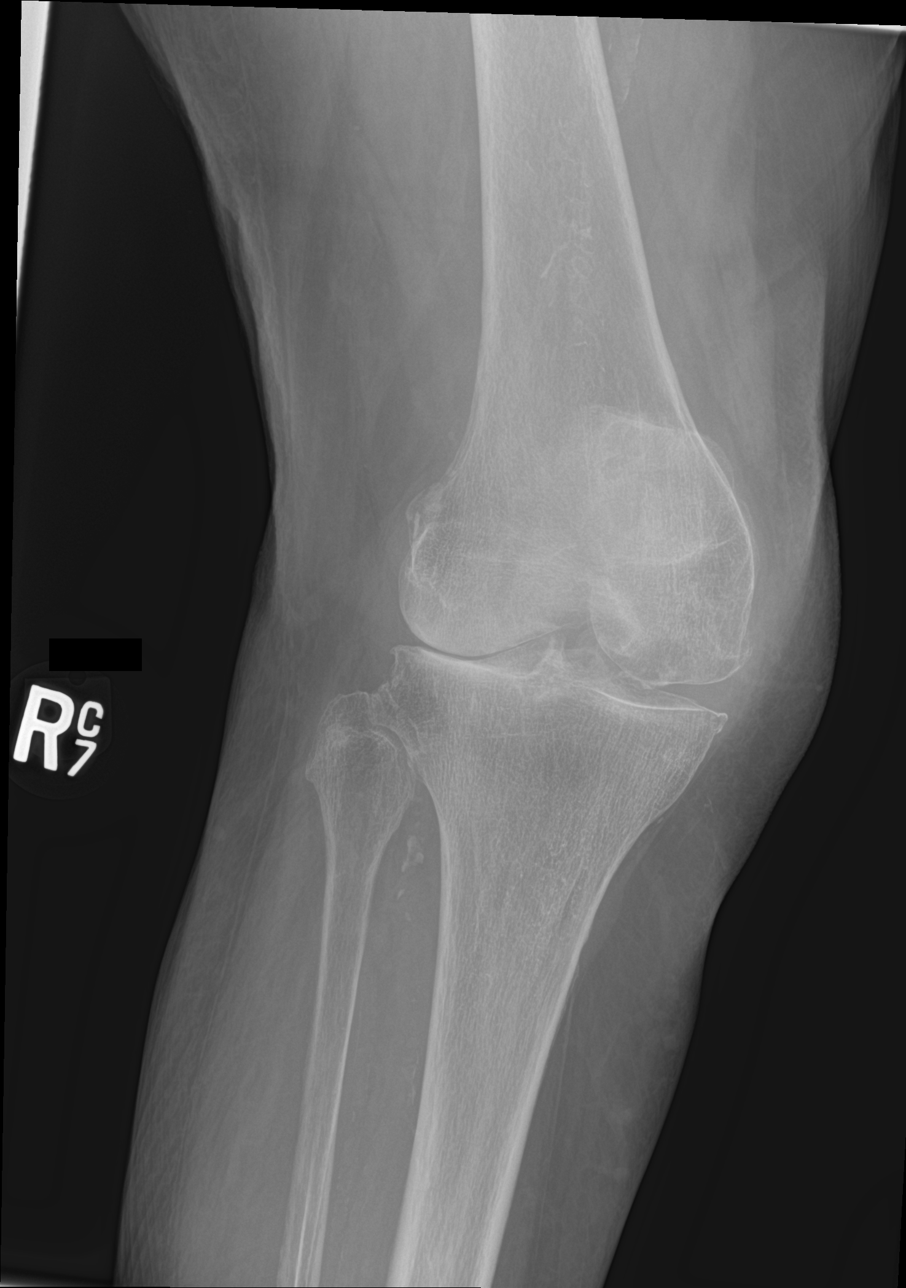

[4 of 4 positions shown; findings below may reference images not displayed]

FINDINGS: No evidence of fracture, dislocation, or joint effusion. Vascular
calcifications are noted. Severe narrowing of lateral joint space is
noted.
IMPRESSION: Severe degenerative joint disease. No acute abnormality seen in the
right knee.
